# Patient Record
Sex: Male | Born: 1940 | Race: White | Hispanic: No | Marital: Single | Smoking: Former smoker
Health system: Southern US, Community
[De-identification: ages and names within clinical notes are randomized; demographics above are authoritative.]

## PROBLEM LIST (undated history)

## (undated) DIAGNOSIS — I10 Essential (primary) hypertension: Secondary | ICD-10-CM

## (undated) DIAGNOSIS — N419 Inflammatory disease of prostate, unspecified: Secondary | ICD-10-CM

## (undated) DIAGNOSIS — I4891 Unspecified atrial fibrillation: Secondary | ICD-10-CM

## (undated) DIAGNOSIS — I509 Heart failure, unspecified: Secondary | ICD-10-CM

## (undated) DIAGNOSIS — K219 Gastro-esophageal reflux disease without esophagitis: Secondary | ICD-10-CM

## (undated) DIAGNOSIS — I2510 Atherosclerotic heart disease of native coronary artery without angina pectoris: Secondary | ICD-10-CM

## (undated) DIAGNOSIS — N200 Calculus of kidney: Secondary | ICD-10-CM

## (undated) DIAGNOSIS — I420 Dilated cardiomyopathy: Secondary | ICD-10-CM

## (undated) DIAGNOSIS — Z86718 Personal history of other venous thrombosis and embolism: Secondary | ICD-10-CM

## (undated) DIAGNOSIS — I428 Other cardiomyopathies: Secondary | ICD-10-CM

## (undated) DIAGNOSIS — J189 Pneumonia, unspecified organism: Secondary | ICD-10-CM

## (undated) DIAGNOSIS — E119 Type 2 diabetes mellitus without complications: Secondary | ICD-10-CM

## (undated) DIAGNOSIS — M1990 Unspecified osteoarthritis, unspecified site: Secondary | ICD-10-CM

## (undated) DIAGNOSIS — I255 Ischemic cardiomyopathy: Secondary | ICD-10-CM

## (undated) DIAGNOSIS — B029 Zoster without complications: Secondary | ICD-10-CM

## (undated) DIAGNOSIS — J449 Chronic obstructive pulmonary disease, unspecified: Secondary | ICD-10-CM

## (undated) HISTORY — DX: Gastro-esophageal reflux disease without esophagitis: K21.9

## (undated) HISTORY — DX: Calculus of kidney: N20.0

## (undated) HISTORY — DX: Personal history of other venous thrombosis and embolism: Z86.718

## (undated) HISTORY — PX: BACK SURGERY: SHX140

## (undated) HISTORY — DX: Ischemic cardiomyopathy: I25.5

## (undated) HISTORY — PX: CORONARY STENT PLACEMENT: SHX1402

## (undated) HISTORY — PX: PACEMAKER INSERTION: SHX728

## (undated) HISTORY — DX: Inflammatory disease of prostate, unspecified: N41.9

## (undated) HISTORY — DX: Heart failure, unspecified: I50.9

## (undated) HISTORY — PX: CARDIAC DEFIBRILLATOR PLACEMENT: SHX171

## (undated) HISTORY — DX: Other cardiomyopathies: I42.8

## (undated) HISTORY — DX: Dilated cardiomyopathy: I42.0

## (undated) HISTORY — DX: Atherosclerotic heart disease of native coronary artery without angina pectoris: I25.10

## (undated) HISTORY — DX: Essential (primary) hypertension: I10

## (undated) HISTORY — DX: Type 2 diabetes mellitus without complications: E11.9

## (undated) HISTORY — DX: Pneumonia, unspecified organism: J18.9

## (undated) HISTORY — DX: Unspecified osteoarthritis, unspecified site: M19.90

## (undated) HISTORY — DX: Unspecified atrial fibrillation: I48.91

---

## 1994-01-21 DIAGNOSIS — I2510 Atherosclerotic heart disease of native coronary artery without angina pectoris: Secondary | ICD-10-CM

## 1994-01-21 HISTORY — DX: Atherosclerotic heart disease of native coronary artery without angina pectoris: I25.10

## 2000-08-05 ENCOUNTER — Ambulatory Visit (HOSPITAL_COMMUNITY): Admission: RE | Admit: 2000-08-05 | Discharge: 2000-08-05 | Payer: Self-pay | Admitting: Internal Medicine

## 2000-08-05 ENCOUNTER — Encounter: Payer: Self-pay | Admitting: Internal Medicine

## 2002-01-18 ENCOUNTER — Ambulatory Visit (HOSPITAL_COMMUNITY): Admission: RE | Admit: 2002-01-18 | Discharge: 2002-01-18 | Payer: Self-pay | Admitting: Internal Medicine

## 2002-01-18 ENCOUNTER — Encounter: Payer: Self-pay | Admitting: Internal Medicine

## 2004-07-04 ENCOUNTER — Emergency Department (HOSPITAL_COMMUNITY): Admission: EM | Admit: 2004-07-04 | Discharge: 2004-07-04 | Payer: Self-pay | Admitting: Family Medicine

## 2005-08-16 ENCOUNTER — Ambulatory Visit: Payer: Self-pay | Admitting: Cardiology

## 2006-04-23 ENCOUNTER — Encounter (INDEPENDENT_AMBULATORY_CARE_PROVIDER_SITE_OTHER): Payer: Self-pay | Admitting: Internal Medicine

## 2007-02-24 ENCOUNTER — Ambulatory Visit: Payer: Self-pay | Admitting: Gastroenterology

## 2007-02-24 ENCOUNTER — Inpatient Hospital Stay (HOSPITAL_COMMUNITY): Admission: EM | Admit: 2007-02-24 | Discharge: 2007-02-26 | Payer: Self-pay | Admitting: Emergency Medicine

## 2007-02-25 ENCOUNTER — Ambulatory Visit: Payer: Self-pay | Admitting: Gastroenterology

## 2008-03-09 ENCOUNTER — Emergency Department (HOSPITAL_COMMUNITY): Admission: EM | Admit: 2008-03-09 | Discharge: 2008-03-09 | Payer: Self-pay | Admitting: Emergency Medicine

## 2010-02-11 ENCOUNTER — Encounter: Payer: Self-pay | Admitting: Thoracic Surgery (Cardiothoracic Vascular Surgery)

## 2010-04-09 DIAGNOSIS — R072 Precordial pain: Secondary | ICD-10-CM

## 2010-04-09 DIAGNOSIS — R000 Tachycardia, unspecified: Secondary | ICD-10-CM

## 2010-04-09 DIAGNOSIS — I509 Heart failure, unspecified: Secondary | ICD-10-CM

## 2010-04-10 DIAGNOSIS — I501 Left ventricular failure: Secondary | ICD-10-CM

## 2010-04-11 DIAGNOSIS — I2589 Other forms of chronic ischemic heart disease: Secondary | ICD-10-CM

## 2010-06-05 NOTE — Consult Note (Signed)
NAME:  Troy [name], Troy [name] NO.:  [id number]   MEDICAL RECORD NO.:  [id number]          PATIENT TYPE:  INP   LOCATION:  A222                          FACILITY:  [facility]   PHYSICIAN:  Caro Hight, M.D.      DATE OF BIRTH:  1940/02/03   DATE OF CONSULTATION:  DATE OF DISCHARGE:                                 CONSULTATION   DATE OF CONSULTATION:  02/24/2007   REFERRING PHYSICIAN:  Unk Lightning, MD   REASON FOR CONSULTATION:  Rectal bleeding.   HISTORY OF PRESENT ILLNESS:  Troy [name] is a 70 year old male who is in  his usual state of health until this morning when he woke up with  painless rectal bleeding.  He has a significant past medical history of  coronary artery disease requiring Coumadin therapy and a history of  diverticulitis treated at [facility].  He denies having  a colonoscopy within the last 5 to 10 years.  He has not been having any  nausea, vomiting, problems swallowing, heartburn, indigestion, abdominal  pain or weight loss.  He has had no diarrhea or black stool that looks  like tar.  He has not been vomiting up any blood.   PAST MEDICAL HISTORY:  1. Hypertension.  2. Coronary artery disease.  3. Gout.   ALLERGIES:  NO KNOWN DRUG ALLERGIES.   MEDICATIONS:  1. Digoxin.  2. Nisoldipine.  3. Coumadin.  4. Allopurinol.  5. Benicar.  6. Naproxen 500 mg twice a day.   FAMILY HISTORY:  His mother had colon cancer at age greater than 32.  He  denies any family history of problems.   SOCIAL HISTORY:  He denies any alcohol use.  He does not smoke.   REVIEW OF SYSTEMS:  He complains of some mild shortness of breath.  He  is on Coumadin as well for a history of pulmonary embolus and deep vein  thrombosis.  His review of systems is per HPI, otherwise all systems are  negative.   PHYSICAL EXAMINATION:  VITAL SIGNS:  Afebrile, hemodynamically stable.  GENERAL:  He is in no apparent distress, alert and oriented x4.  HEENT  EXAM:  Atraumatic, normocephalic, pupils equal and react to light.  Mouth:  No oral lesions.  Posterior pharynx without erythema or exudate.  NECK:  Has full range of motion, no lymphadenopathy.  LUNGS:  Clear to auscultation bilaterally.  CARDIOVASCULAR EXAM:  Regular rhythm, normal S1, S2.  ABDOMEN:  Bowel sounds are present, soft, nontender, nondistended, no  rebound or guarding.  EXTREMITIES:  Have no cyanosis or edema.  NEUROLOGIC:  He has no focal neurological deficit.   LABORATORY DATA:  White count 15.1, hemoglobin 14 to 12.5, platelets  212, INR 2.5, BUN 23, creatinine 1.54.   ASSESSMENT:  Troy [name] is a 70 year old male with painless rectal  bleeding.  The differential diagnosis includes diverticular or  hemorrhoidal bleed and a low likelihood of colorectal cancer or a polyp  or NSAID-induced ulcer.   Thank you for allowing me to see Troy [name] in consultation.  My  recommendations follow.   RECOMMENDATIONS:  1. Clear liquid diet now and then NPO at midnight.  2. Will schedule colonoscopy tomorrow.  3. Hold aspirin, Plavix, Coumadin, and anti-inflammatory drugs.  4. Check PT and INR on 02/25/2007.  His INR needs to be less than 2.0.  5. Give Vitamin K 2.5 mg IV now.  6. Serial hemoglobin and hematocrit and transfuse if needed.      Caro Hight, M.D.  Electronically Signed     SM/MEDQ  D:  02/24/2007  T:  02/24/2007  Job:  [id number]   cc:   Unk Lightning, MD  Fax: [phone]

## 2010-06-05 NOTE — H&P (Signed)
NAME:  Troy [name], FEINSTEIN NO.:  [id number]   MEDICAL RECORD NO.:  [id number]          PATIENT TYPE:  INP   LOCATION:  A222                          FACILITY:  [facility]   PHYSICIAN:  Unk Lightning, MDDATE OF BIRTH:  [birth year]   DATE OF ADMISSION:  02/24/2007  DATE OF DISCHARGE:  LH                              HISTORY & PHYSICAL   HISTORY:  The patient is a 70 year old white male who is current  anticoagulated due to paroxysmal atrial fibrillation, currently in sinus  rhythm.  The patient had a bowel movement this morning and noticed frank  hematochezia.  He denied any specific abdominal pain, dizziness, angina,  orthopnea or syncopal episode.  The patient presented to the emergency  room where his hemoglobin was 14.1; however, there were maroon stools  and bright red stools.  He is admitted for a presumed lower GI bleed.   PAST MEDICAL HISTORY:  1. Significant for type 2 diabetes.  2. Hypertension.  3. Paroxysmal atrial fibrillation, currently in sinus rhythm.  4. Gout.  5. Degenerative joint disease.  6. Type 2 diabetes.   PAST SURGICAL HISTORY:  Essentially unremarkable.   ALLERGIES:  No known drug allergies.   CURRENT MEDICATIONS:  1. Benicar 40/12.5 mg daily.  2. Coumadin 5/2.5/2.5 mg.  3. Sular 17 mg daily.  4. Allopurinol 300 mg daily.  5. Lanoxin 0.125 mg daily.  6. Naprosyn 500 mg p.o. twice daily.  7. Glipizide XL 10 mg daily.   PHYSICAL EXAMINATION:  VITAL SIGNS:  Blood pressure 127/80, respirations  18, pulse 88 and regular, O2 saturation 97%.  HEENT:  Eyes:  Pupils equal, round, reactive to light and accommodation.  Extraocular movements intact.  Oropharynx pink.  NECK:  No jugular venous distention, no carotid bruits, no thyromegaly.  LUNGS:  Prolonged expiratory phase.  No rales, wheeze or rhonchi  appreciable.  HEART:  A regular rhythm.  No murmurs, gallops, heaves, thrills or rubs.  ABDOMEN:  Soft, bowel sounds mildly  hyperactive.  No borborygmus.  No  peristaltic rushes.  No detectable organomegaly.  No tenderness.  RECTAL:  Frankly heme-positive visually in the emergency room.  EXTREMITIES:  No clubbing, cyanosis or edema.  NEUROLOGIC:  Cranial nerves II-XII  grossly intact.  The patient moves  all four extremities.   IMPRESSION:  1. Lower gastrointestinal bleed.  2. Hypertension.  3. Anticoagulation, secondary to paroxysmal atrial fibrillation.  4. Currently in sinus rhythm.  5. Type 2 diabetes.  6. Degenerative joint disease, on non-steroidal anti-inflammatory      drugs.   PLAN:  To admit.  A clear liquid diet.  Hemoglobin and hematocrit q.6h.  Type and cross match for 2 units.  A GI consultation for presumed  colonoscopy  per Dr. Caro Hight.  Monitor hemodynamic status.  Monitor  PT and INR and vitamin K to reverse any effects of anticoagulation.  I  will make further recommendations as the data base expands.      Unk Lightning, MD  Electronically Signed     RMD/MEDQ  D:  02/24/2007  T:  02/25/2007  Job:  [id number]

## 2010-06-05 NOTE — Op Note (Signed)
NAME:  Troy [name], Troy [name] NO.:  [id number]   MEDICAL RECORD NO.:  [id number]          PATIENT TYPE:  INP   LOCATION:  A222                          FACILITY:  [facility]   PHYSICIAN:  Caro Hight, M.D.      DATE OF BIRTH:  14-Jun-1940   DATE OF PROCEDURE:  02/25/2007  DATE OF DISCHARGE:                               OPERATIVE REPORT   PROCEDURE:  Colonoscopy.   ENDOSCOPIST:  Dr. Caro Hight.   INDICATIONS FOR PROCEDURE:  Troy [name] is a 70 year old male who  presented with rectal bleeding.  He is on Coumadin due to a history of  pulmonary embolus and deep venous thrombosis.  His last episode of  rectal bleeding was yesterday.   FINDINGS:  1. No old blood or fresh blood seen in the colon. Frequent sigmoid      diverticula.  No diverticula seemed to be the culprit for the      rectal bleeding.  Otherwise no polyps, masses, inflammatory changes      or arteriovenous malformations seen.  2. Moderate internal hemorrhoids.  Otherwise normal retroflexed view      of the rectum.   DIAGNOSIS:  Rectal bleeding, likely secondary to diverticulosis.   DIFFERENTIAL DIAGNOSIS:  Hemorrhoidal bleed while anticoagulated.   RECOMMENDATIONS:  1. Anusol HC per rectum q.12h. for 14 days.  2. Colace 100 mg twice daily.  3. May restart Coumadin in seven days and monitor for bleeding.  4. Advance diet and may administer his Avapro today.   MEDICATIONS:  1. Demerol 50 mg IV.  2. Versed 4 mg IV.   DESCRIPTION OF PROCEDURE:  A physical exam was performed.  An informed  consent was obtained from the patient after explaining the benefits,  risks and alternatives to the procedure.  The patient was connected to  the monitor and placed in the left lateral position.  Continuous oxygen  was provided by nasal cannula and IV medicine administered with an  indwelling cannula.  After administration of sedation and rectal exam,  the patient's rectum was intubated and the scope was advanced under  direct visualization to the cecum.  The scope was removed slowly by  carefully examining the color, texture, anatomy and the integrity of the  mucosa on the way out.  The patient was recovered in endoscopy and was  discharged to the floor in  satisfactory condition. Discussed these  findings and plan with Dr. Percell Miller L. Hawkins and Dr. Cindie Laroche.      Caro Hight, M.D.  Electronically Signed     SM/MEDQ  D:  02/25/2007  T:  02/25/2007  Job:  [id number]   cc:   Unk Lightning, MD  Fax: [phone]

## 2010-06-08 NOTE — Discharge Summary (Signed)
NAME:  Troy [name], Troy [name] NO.:  [id number]   MEDICAL RECORD NO.:  [id number]          PATIENT TYPE:  INP   LOCATION:  A222                          FACILITY:  [facility]   PHYSICIAN:  Unk Lightning, MDDATE OF BIRTH:  [birth year]   DATE OF ADMISSION:  02/24/2007  DATE OF DISCHARGE:  02/05/2009LH                               DISCHARGE SUMMARY   The patient is a 70 year old white male with significant medical history  of hypertension, type 2 diabetes, history of paroxysmal atrial  fibrillation, currently in sinus rhythm, DJD as well as gout.  The  patient was admitted with frank hematochezia.  Hemoglobin did not drop,  however, his white serials and CBC reveal no further drop.  He had frank  blood in the rectum and EGD and colonoscopy were performed revealing the  mild diverticular disease and mild internal hemorrhoids.  However, no  significant polyps or colonic lesions.  EGD likewise was essentially  remarkable.  The patient was admitted and placed on Protonix 40 mg per  day in addition to high-fiber diet, and will follow up in the office in  4 days' time for CBC.      Unk Lightning, MD  Electronically Signed     RMD/MEDQ  D:  05/17/2007  T:  05/17/2007  Job:  [id number]

## 2010-08-21 ENCOUNTER — Inpatient Hospital Stay (HOSPITAL_COMMUNITY): Payer: Medicare Other

## 2010-08-21 ENCOUNTER — Inpatient Hospital Stay (HOSPITAL_COMMUNITY)
Admission: EM | Admit: 2010-08-21 | Discharge: 2010-08-27 | DRG: 226 | Disposition: A | Discharge status: Home Health | Payer: Medicare Other | Attending: Interventional Cardiology | Admitting: Interventional Cardiology

## 2010-08-21 ENCOUNTER — Emergency Department (HOSPITAL_COMMUNITY): Payer: Medicare Other

## 2010-08-21 DIAGNOSIS — I442 Atrioventricular block, complete: Secondary | ICD-10-CM

## 2010-08-21 DIAGNOSIS — I2510 Atherosclerotic heart disease of native coronary artery without angina pectoris: Secondary | ICD-10-CM | POA: Diagnosis present

## 2010-08-21 DIAGNOSIS — K5731 Diverticulosis of large intestine without perforation or abscess with bleeding: Secondary | ICD-10-CM | POA: Diagnosis present

## 2010-08-21 DIAGNOSIS — I2782 Chronic pulmonary embolism: Secondary | ICD-10-CM | POA: Diagnosis present

## 2010-08-21 DIAGNOSIS — I252 Old myocardial infarction: Secondary | ICD-10-CM

## 2010-08-21 DIAGNOSIS — Z7901 Long term (current) use of anticoagulants: Secondary | ICD-10-CM

## 2010-08-21 DIAGNOSIS — I509 Heart failure, unspecified: Secondary | ICD-10-CM | POA: Diagnosis present

## 2010-08-21 DIAGNOSIS — I129 Hypertensive chronic kidney disease with stage 1 through stage 4 chronic kidney disease, or unspecified chronic kidney disease: Secondary | ICD-10-CM | POA: Diagnosis present

## 2010-08-21 DIAGNOSIS — Z87891 Personal history of nicotine dependence: Secondary | ICD-10-CM

## 2010-08-21 DIAGNOSIS — N189 Chronic kidney disease, unspecified: Secondary | ICD-10-CM | POA: Diagnosis present

## 2010-08-21 DIAGNOSIS — I2589 Other forms of chronic ischemic heart disease: Secondary | ICD-10-CM | POA: Diagnosis present

## 2010-08-21 DIAGNOSIS — E119 Type 2 diabetes mellitus without complications: Secondary | ICD-10-CM | POA: Diagnosis present

## 2010-08-21 DIAGNOSIS — I5022 Chronic systolic (congestive) heart failure: Secondary | ICD-10-CM | POA: Diagnosis present

## 2010-08-21 DIAGNOSIS — R791 Abnormal coagulation profile: Secondary | ICD-10-CM | POA: Diagnosis present

## 2010-08-21 DIAGNOSIS — I4892 Unspecified atrial flutter: Secondary | ICD-10-CM | POA: Diagnosis present

## 2010-08-21 LAB — COMPREHENSIVE METABOLIC PANEL
BUN: 33 mg/dL — ABNORMAL HIGH (ref 6–23)
CO2: 34 mEq/L — ABNORMAL HIGH (ref 19–32)
Calcium: 9.4 mg/dL (ref 8.4–10.5)
Chloride: 100 mEq/L (ref 96–112)
Creatinine, Ser: 2.05 mg/dL — ABNORMAL HIGH (ref 0.50–1.35)
GFR calc Af Amer: 39 mL/min — ABNORMAL LOW (ref >60)
GFR calc non Af Amer: 32 mL/min — ABNORMAL LOW (ref >60)
Glucose, Bld: 135 mg/dL — ABNORMAL HIGH (ref 70–99)
Total Bilirubin: 0.6 mg/dL (ref 0.3–1.2)

## 2010-08-21 LAB — PROTIME-INR: INR: 4.38 — ABNORMAL HIGH (ref 0.00–1.49)

## 2010-08-21 LAB — CBC
HCT: 42.6 % (ref 39.0–52.0)
Hemoglobin: 14.7 g/dL (ref 13.0–17.0)
MCH: 34.3 pg — ABNORMAL HIGH (ref 26.0–34.0)
MCHC: 34.5 g/dL (ref 30.0–36.0)
MCV: 99.5 fL (ref 78.0–100.0)
Platelets: 136 10*3/uL — ABNORMAL LOW (ref 150–400)
RBC: 4.28 MIL/uL (ref 4.22–5.81)
RDW: 14.8 % (ref 11.5–15.5)
WBC: 9.2 10*3/uL (ref 4.0–10.5)

## 2010-08-21 LAB — BASIC METABOLIC PANEL
BUN: 34 mg/dL — ABNORMAL HIGH (ref 6–23)
Calcium: 9.4 mg/dL (ref 8.4–10.5)
Creatinine, Ser: 1.98 mg/dL — ABNORMAL HIGH (ref 0.50–1.35)
GFR calc Af Amer: 41 mL/min — ABNORMAL LOW (ref >60)
GFR calc non Af Amer: 34 mL/min — ABNORMAL LOW (ref >60)
Glucose, Bld: 145 mg/dL — ABNORMAL HIGH (ref 70–99)
Potassium: 4 mEq/L (ref 3.5–5.1)

## 2010-08-21 LAB — CK TOTAL AND CKMB (NOT AT ARMC)
CK, MB: 2.6 ng/mL (ref 0.3–4.0)
Relative Index: INVALID (ref 0.0–2.5)
Total CK: 34 U/L (ref 7–232)

## 2010-08-21 LAB — DIFFERENTIAL
Basophils Absolute: 0 10*3/uL (ref 0.0–0.1)
Basophils Relative: 0 % (ref 0–1)
Eosinophils Absolute: 0.2 10*3/uL (ref 0.0–0.7)
Eosinophils Relative: 2 % (ref 0–5)
Lymphocytes Relative: 24 % (ref 12–46)
Lymphs Abs: 2.2 10*3/uL (ref 0.7–4.0)
Monocytes Absolute: 0.9 10*3/uL (ref 0.1–1.0)
Monocytes Relative: 10 % (ref 3–12)
Neutro Abs: 5.9 10*3/uL (ref 1.7–7.7)
Neutrophils Relative %: 65 % (ref 43–77)

## 2010-08-21 LAB — CARDIAC PANEL(CRET KIN+CKTOT+MB+TROPI)
CK, MB: 2.7 ng/mL (ref 0.3–4.0)
Relative Index: 2.6 — ABNORMAL HIGH (ref 0.0–2.5)
Total CK: 102 U/L (ref 7–232)

## 2010-08-21 LAB — TYPE AND SCREEN
Antibody Screen: POSITIVE
DAT, IgG: NEGATIVE

## 2010-08-21 LAB — GLUCOSE, CAPILLARY: Glucose-Capillary: 99 mg/dL (ref 70–99)

## 2010-08-21 LAB — PHOSPHORUS: Phosphorus: 2.8 mg/dL (ref 2.3–4.6)

## 2010-08-21 LAB — MRSA PCR SCREENING: MRSA by PCR: NEGATIVE

## 2010-08-22 ENCOUNTER — Encounter: Payer: Self-pay | Admitting: *Deleted

## 2010-08-22 DIAGNOSIS — I509 Heart failure, unspecified: Secondary | ICD-10-CM

## 2010-08-22 DIAGNOSIS — I2589 Other forms of chronic ischemic heart disease: Secondary | ICD-10-CM

## 2010-08-22 LAB — BASIC METABOLIC PANEL
BUN: 34 mg/dL — ABNORMAL HIGH (ref 6–23)
Calcium: 9.4 mg/dL (ref 8.4–10.5)
GFR calc Af Amer: 41 mL/min — ABNORMAL LOW (ref >60)
GFR calc non Af Amer: 34 mL/min — ABNORMAL LOW (ref >60)
Glucose, Bld: 136 mg/dL — ABNORMAL HIGH (ref 70–99)
Potassium: 4.3 mEq/L (ref 3.5–5.1)

## 2010-08-22 LAB — CARDIAC PANEL(CRET KIN+CKTOT+MB+TROPI)
CK, MB: 3 ng/mL (ref 0.3–4.0)
Total CK: 45 U/L (ref 7–232)
Troponin I: <0.3 ng/mL (ref <0.30)

## 2010-08-22 LAB — OCCULT BLOOD X 1 CARD TO LAB, STOOL: Fecal Occult Bld: POSITIVE

## 2010-08-22 LAB — GLUCOSE, CAPILLARY

## 2010-08-22 LAB — PROTIME-INR: Prothrombin Time: 26.3 seconds — ABNORMAL HIGH (ref 11.6–15.2)

## 2010-08-23 ENCOUNTER — Inpatient Hospital Stay (HOSPITAL_COMMUNITY): Payer: Medicare Other

## 2010-08-23 DIAGNOSIS — I4892 Unspecified atrial flutter: Secondary | ICD-10-CM

## 2010-08-23 LAB — GLUCOSE, CAPILLARY: Glucose-Capillary: 90 mg/dL (ref 70–99)

## 2010-08-23 LAB — PREPARE FRESH FROZEN PLASMA: Unit division: 0

## 2010-08-23 LAB — BASIC METABOLIC PANEL
BUN: 35 mg/dL — ABNORMAL HIGH (ref 6–23)
Calcium: 8.6 mg/dL (ref 8.4–10.5)
GFR calc Af Amer: 49 mL/min — ABNORMAL LOW (ref >60)
GFR calc non Af Amer: 41 mL/min — ABNORMAL LOW (ref >60)
Potassium: 3.5 mEq/L (ref 3.5–5.1)
Sodium: 143 mEq/L (ref 135–145)

## 2010-08-23 LAB — CBC
MCH: 33.6 pg (ref 26.0–34.0)
MCHC: 33.8 g/dL (ref 30.0–36.0)
RDW: 14.3 % (ref 11.5–15.5)

## 2010-08-23 NOTE — H&P (Signed)
NAME:  Troy [name], Troy [name] NO.:  [id number]  MEDICAL RECORD NO.:  [id number]  LOCATION:  2907                         FACILITY:  [facility]  PHYSICIAN:  Fransico Him, M.D.     DATE OF BIRTH:  Feb 20, 1940  DATE OF ADMISSION:  08/21/2010 DATE OF DISCHARGE:                             HISTORY & PHYSICAL   PRIMARY CARDIOLOGIST:  Jettie Booze, MD  REFERRING PHYSICIAN:  Dr. Neomia Dear from the emergency room.  CHIEF COMPLAINT:  Shortness of breath.  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  He denies any chest pain, palpitations, or lower extremity edema or syncope.  He says he occasionally has lightheadedness.  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  ALLERGIES:  BENADRYL.  PAST SURGICAL HISTORY:  __________ surgery.  MEDICATIONS: 1. Digoxin 0.125 mg daily. 2. Torsemide 20 mg 2 tablets daily. 3. Carvedilol 6.25 mg b.i.d. 4. Warfarin. 5. Allopurinol 100 mg daily.  SOCIAL HISTORY:  He quit tobacco in 1982.  He had a 40-pack-year history prior to that.  He denies any alcohol use.  He is single.  FAMILY HISTORY:  Noncontributory.  REVIEW OF SYSTEMS:  Otherwise stated in the HPI is negative.  PHYSICAL EXAMINATION:  VITAL SIGNS:  Blood pressure is 147/55, heart rate 38. GENERAL:  This is a well-developed, well-nourished male in no acute distress. HEENT:  Benign. NECK:  Supple without lymphadenopathy.  Carotid upstrokes are +2 bilaterally.  No bruits. LUNGS:   Clear to auscultation throughout. HEART:  Regular rate and rhythm.  No murmurs, rubs, or gallops but bradycardic. ABDOMEN:  Soft, nontender, nondistended.  Normoactive bowel sounds.  No hepatosplenomegaly. EXTREMITIES:  No cyanosis, erythema, or edema.  LABORATORY DATA:  INR 4.38.  White cell count 9.2, hemoglobin 14.7, hematocrit 42.6, platelet count 136,000.  Chest x-ray shows cardiomegaly with central vascular congestion.  EKG shows sinus rhythm with complete heart block.  ASSESSMENT: 1. Complete heart block. 2. History of paroxysmal atrial fibrillation on chronic systemic     anticoagulation. 3. 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 4. Supratherapeutic INR. 5. Coronary artery disease status post remote percutaneous coronary     intervention of the right coronary artery. 6. Hypertension. 7. Diabetes mellitus. 8. Shortness of breath secondary to complete heart block.  PLAN:  Admit to CCU.  Cycle cardiac enzymes.  We will check a TSH and BMP.  We will check a 2-D echocardiogram to reevaluate LV function.  He needs to stay on Coreg long-term secondary to dilated cardiomyopathy. Therefore, we will plan permanent pacemaker, Dr. Rayann Heman has been consulted.  In the interim, we will hold his Coreg and digoxin.  We will also hold his Coumadin.  Fransico Him, M.D.     TT/MEDQ  D:  08/22/2010  T:  08/22/2010  Job:  [id number]  cc:   Jettie Booze, MD  Electronically Signed by Fransico Him M.D. on 08/23/2010 08:57:24 AM

## 2010-08-24 LAB — GLUCOSE, CAPILLARY
Glucose-Capillary: 105 mg/dL — ABNORMAL HIGH (ref 70–99)
Glucose-Capillary: 203 mg/dL — ABNORMAL HIGH (ref 70–99)
Glucose-Capillary: 142 mg/dL — ABNORMAL HIGH (ref 70–99)
Glucose-Capillary: 162 mg/dL — ABNORMAL HIGH (ref 70–99)

## 2010-08-24 LAB — BASIC METABOLIC PANEL
CO2: 39 mEq/L — ABNORMAL HIGH (ref 19–32)
Chloride: 96 mEq/L (ref 96–112)
Creatinine, Ser: 1.5 mg/dL — ABNORMAL HIGH (ref 0.50–1.35)
GFR calc Af Amer: 56 mL/min — ABNORMAL LOW (ref >60)
Potassium: 3.5 mEq/L (ref 3.5–5.1)
Sodium: 140 mEq/L (ref 135–145)

## 2010-08-24 LAB — PROTIME-INR
INR: 2.52 — ABNORMAL HIGH (ref 0.00–1.49)
Prothrombin Time: 27.6 seconds — ABNORMAL HIGH (ref 11.6–15.2)

## 2010-08-24 LAB — CBC
HCT: 40.1 % (ref 39.0–52.0)
Hemoglobin: 13.5 g/dL (ref 13.0–17.0)
MCHC: 33.7 g/dL (ref 30.0–36.0)
RBC: 4.05 MIL/uL — ABNORMAL LOW (ref 4.22–5.81)

## 2010-08-25 LAB — CBC
HCT: 40.8 % (ref 39.0–52.0)
MCH: 33.7 pg (ref 26.0–34.0)
MCHC: 33.6 g/dL (ref 30.0–36.0)
MCV: 100.2 fL — ABNORMAL HIGH (ref 78.0–100.0)
Platelets: 108 10*3/uL — ABNORMAL LOW (ref 150–400)
RDW: 14.3 % (ref 11.5–15.5)

## 2010-08-25 LAB — BASIC METABOLIC PANEL
BUN: 37 mg/dL — ABNORMAL HIGH (ref 6–23)
CO2: 41 mEq/L (ref 19–32)
Calcium: 9.1 mg/dL (ref 8.4–10.5)
GFR calc non Af Amer: 51 mL/min — ABNORMAL LOW (ref >60)
Glucose, Bld: 121 mg/dL — ABNORMAL HIGH (ref 70–99)
Sodium: 142 mEq/L (ref 135–145)

## 2010-08-25 LAB — GLUCOSE, CAPILLARY: Glucose-Capillary: 162 mg/dL — ABNORMAL HIGH (ref 70–99)

## 2010-08-26 LAB — BASIC METABOLIC PANEL
CO2: 40 mEq/L (ref 19–32)
Calcium: 9.1 mg/dL (ref 8.4–10.5)
Chloride: 97 mEq/L (ref 96–112)
Creatinine, Ser: 1.37 mg/dL — ABNORMAL HIGH (ref 0.50–1.35)
Glucose, Bld: 120 mg/dL — ABNORMAL HIGH (ref 70–99)
Sodium: 142 mEq/L (ref 135–145)

## 2010-08-26 LAB — CBC
Hemoglobin: 12.6 g/dL — ABNORMAL LOW (ref 13.0–17.0)
MCH: 33.4 pg (ref 26.0–34.0)
MCV: 100.8 fL — ABNORMAL HIGH (ref 78.0–100.0)
Platelets: 116 10*3/uL — ABNORMAL LOW (ref 150–400)
RBC: 3.77 MIL/uL — ABNORMAL LOW (ref 4.22–5.81)
WBC: 9.5 10*3/uL (ref 4.0–10.5)

## 2010-08-27 LAB — PROTIME-INR
INR: 2.53 — ABNORMAL HIGH (ref 0.00–1.49)
Prothrombin Time: 27.7 seconds — ABNORMAL HIGH (ref 11.6–15.2)

## 2010-08-27 LAB — BASIC METABOLIC PANEL
BUN: 31 mg/dL — ABNORMAL HIGH (ref 6–23)
Chloride: 96 mEq/L (ref 96–112)
Creatinine, Ser: 1.13 mg/dL (ref 0.50–1.35)
GFR calc Af Amer: >60 mL/min (ref >60)
Glucose, Bld: 118 mg/dL — ABNORMAL HIGH (ref 70–99)

## 2010-08-28 DIAGNOSIS — K5731 Diverticulosis of large intestine without perforation or abscess with bleeding: Secondary | ICD-10-CM | POA: Insufficient documentation

## 2010-08-31 NOTE — Op Note (Signed)
NAME:  Troy [name], Troy [name] NO.:  [id number]  MEDICAL RECORD NO.:  [id number]  LOCATION:  2907                         FACILITY:  [facility]  PHYSICIAN:  Champ Mungo. Lovena Le, MD    DATE OF BIRTH:  01-Mar-1940  DATE OF PROCEDURE:  08/22/2010 DATE OF DISCHARGE:                              OPERATIVE REPORT   PROCEDURE PERFORMED:  Insertion of a biventricular implantable cardioverter-defibrillator.  INDICATION:  Ischemic cardiomyopathy, EF 20%, presenting with complete heart block and class 3 heart failure.  INTRODUCTION:  The patient is a 70 year old man who has longstanding coronary artery disease status post myocardial infarction.  His ejection fraction is 20%.  The patient presents with congestive heart failure and is found to be in complete heart block.  He has very dense and frequent ventricular ectopy and nonsustained VT.  He is now referred for BiV ICD implantation.  PROCEDURE:  After informed consent was obtained, the patient was taken to the Diagnostic EP Lab in the fasting state.  After usual preparation and draping, intravenous fentanyl and midazolam was given for sedation. 30 mL of lidocaine was infiltrated into the left infraclavicular region. A 6-cm incision was carried out over this region and an electrocautery was utilized to dissect down to the fascial plane.  The left subclavian vein was punctured x3 and the St. Jude model [id number] bipolar active fixation defibrillation lead, serial number [id number], was advanced into the right ventricle and the St. Jude model 2088T 52-cm active fixation pacing lead, serial number [id number] was advanced into the right atrium. Mapping was carried out in the right ventricle and at the final site on the RV septum the R-waves measured 8 mV and the pacing impedance was 700 ohms.  The threshold was a volt at 0.5 milliseconds.  A 10-volt pacing did not stimulate the diaphragm.  With the right ventricular lead in satisfactory  position, attention was then turned to the placement of the atrial lead which was placed in the anterolateral wall of the right atrium where P-wave was measured 2.5 mV and the pacing impedance with the lead actively fixed was 530 ohms.  The threshold was 0.8 volts at 0.5 milliseconds.  Again, a 10-volt pacing did not stimulate the diaphragm.  With both atrial and ventricular leads in satisfactory position, attention was then turned to the placement of the left ventricular lead.  The coronary sinus guiding catheter was advanced into the right atrium and the coronary sinus was cannulated with the aid of a 6-French hexapolar EP catheter.  Venography of the coronary sinus was carried out demonstrating a large posterolateral vein, which was selected for LV lead placement.  The St. Jude 1458Q-86 cm quadripolar LV pacing lead, serial number [id number] was advanced into the posterolateral vein.  Multiple satisfactory pacing configurations were demonstrated.  The guiding catheter was removed from the vein in the usual manner.  The leads were secured to the subpectoralis fascia with a figure-of-eight silk suture and the sewing sleeve was secured with silk suture.  Electrocautery was then utilized to make subcutaneous pocket. Antibiotic irrigation was utilized to irrigate the pocket and the St. Jude defibrillator was connected to the right atrial, RV, and LV leads. Of note,  the defibrillator lead was actually a Durata lead, serial number [id number].  The defibrillator was serial number [id number].  After additional antibiotic irrigation was utilized to irrigate the pocket, defibrillation threshold testing was carried out.  After the patient was more deeply sedated with fentanyl and Versed, VF was induced with a T-wave shock.  A 12-joule shock was delivered, which terminated ventricular fibrillation and restored sinus rhythm.  No additional defibrillation threshold testing was carried out and the incision  was closed with 2-0 and 3-0 Vicryl.  Benzoin and Steri-Strips were painted on the skin, pressure dressing was applied, and the patient was returned to his room in satisfactory condition.  COMPLICATIONS:  There were no immediate procedure complications.  RESULTS:  This demonstrates successful implantation of a biventricular ICD in a patient with an ischemic cardiomyopathy, class 3 congestive heart failure, and complete heart block.     Champ Mungo. Lovena Le, MD     GWT/MEDQ  D:  08/22/2010  T:  08/22/2010  Job:  [id number]  Electronically Signed by Cristopher Peru MD on 08/31/2010 09:54:12 AM

## 2010-09-04 NOTE — Consult Note (Signed)
  NAME:  Troy [name], Troy [name] NO.:  [id number]  MEDICAL RECORD NO.:  [id number]  LOCATION:  2923                         FACILITY:  [facility]  PHYSICIAN:  Ronald Lobo, M.D.   DATE OF BIRTH:  Aug 09, 1940  DATE OF CONSULTATION:  08/23/2010 DATE OF DISCHARGE:                                CONSULTATION   [name] asked [place] to see this 70 year old gentleman because of rectal bleeding.  This gentleman has a history of severe cardiomyopathy with an ejection fraction on the order of 25-30%, as well as a history of DVTs and pulmonary emboli for which he is maintained on chronic anticoagulation with Coumadin.  He came into the hospital a couple of days ago in complete heart block and underwent placement of a pacemaker and implantable defibrillator yesterday.  Shortly after that procedure, he had passage of bright red blood per rectum.  He has not had any further bowel movements today.  It is not entirely clear to me how many times he passed blood yesterday. His hemoglobin did not drop significantly, it was 14.7 two days ago and is 13.2 today.  Platelets are slightly low at 105,000.  The patient indicates that about 20 years ago he was hospitalized at [facility] in [place], [place] for what sounds like diverticular hemorrhage.  PAST MEDICAL HISTORY:  Allergy to BENADRYL.  Outpatient medications:  Digoxin, torsemide, carvedilol, Coumadin, and allopurinol.  Operations:  He has had prostate surgery in the past.  Medical illnesses:  Coronary artery disease, history of severe ischemic dilated cardiomyopathy, hypertension, GERD, recurrent DVTs and pulmonary emboli, type 2 diabetes, gout, and kidney stones.  HABITS:  Nonsmoker for the past 20 years, nondrinker.  FAMILY HISTORY:  Not obtained.  PHYSICAL EXAMINATION:  GENERAL:  A very pleasant, somewhat elderly gentleman in no acute distress. HEENT:  Anicteric, no frank pallor. SKIN:  Warm. CHEST:   Clear. HEART:  Without murmur or arrhythmia. ABDOMEN:  Without mass or tenderness.  LABORATORY DATA:  Per HPI.  Platelets are somewhat low at 105,000, BUN 35, creatinine 1.7.  IMPRESSION:  Transient small volume hematochezia without obvious destabilization or significant drop in hemoglobin.  The differential diagnosis would include hemorrhoids, stercoral ulceration, ischemic colitis, vascular ectasia, self-limited diverticular bleeding, or, less likely, colorectal neoplasia.  PLAN:  Sigmoidoscopic evaluation to try to clarify the source of bleeding.          ______________________________ Ronald Lobo, M.D.     RB/MEDQ  D:  08/23/2010  T:  08/24/2010  Job:  [id number]  Electronically Signed by Ronald Lobo M.D. on 09/04/2010 10:58:07 AM

## 2010-09-05 ENCOUNTER — Ambulatory Visit: Payer: Medicare Other | Admitting: *Deleted

## 2010-09-06 ENCOUNTER — Ambulatory Visit (INDEPENDENT_AMBULATORY_CARE_PROVIDER_SITE_OTHER): Payer: Medicare Other | Admitting: *Deleted

## 2010-09-06 DIAGNOSIS — I2589 Other forms of chronic ischemic heart disease: Secondary | ICD-10-CM

## 2010-09-06 DIAGNOSIS — I4510 Unspecified right bundle-branch block: Secondary | ICD-10-CM

## 2010-09-06 NOTE — Progress Notes (Signed)
Wound check-ICD 

## 2010-09-13 NOTE — Consult Note (Signed)
NAME:  Troy [name], KRAHMER NO.:  [id number]  MEDICAL RECORD NO.:  [id number]  LOCATION:  [id number]                         FACILITY:  [facility]  PHYSICIAN:  Thompson Grayer, MD       DATE OF BIRTH:  25-Nov-1940  DATE OF CONSULTATION: DATE OF DISCHARGE:                                CONSULTATION   REFERRING PHYSICIAN:  Fransico Him, MD  REASON FOR CONSULTATION:  Complete heart block.  HISTORY OF PRESENT ILLNESS:  Mr. Daisy is a pleasant 70 year old gentleman with a known history of coronary disease, ischemic cardiomyopathy, and New York Heart Association Class II/III, congestive heart failure symptoms chronically who now presents with symptomatic complete heart block.  The patient reports that over the past few months, he has had progressive symptoms of shortness of breath.  He presented to [facility] and was evaluated there by Dr. Dannielle Burn. He had an echocardiogram which revealed an ejection fraction of 35-40%. He was therefore treated with medical therapies.  He also had a Myoview at that time which was apparently at low risk and medical management was recommended.  He has been following with Dr. Larae Grooms since that time.  He reports some improvements in shortness of breath, but typically has decreased exercise tolerance as well as orthopnea and occasional PND.  He reports stable edema.  He has a history of PTE and therefore is chronically anticoagulated with Coumadin.  He previously underwent stenting at [facility] in 1996.  Today, he reports acute worsening in shortness of breath.  He also reports fatigue.  He denies dizziness, presyncope, or syncope.  He presented to [facility] Emergency Department where he was found to have complete heart block.  He is therefore admitted for further management.  PAST MEDICAL HISTORY: 1. CAD status post PCI of the RCA in 1996. 2. Ischemic cardiomyopathy, ejection fraction previously (35-40%). 3. New York Heart Association  Class II/III congestive heart failure     chronically. 4. Diabetes. 5. Hypertension. 6. Prior PTE and DVT with recurrence, chronically anticoagulated with     Coumadin. 7. GERD. 8. DJD. 9. Prostatitis. 10.Recurrent nephrolithiasis.  SOCIAL HISTORY:  The patient lives in [place].  He has a history of tobacco, but quit in 1982.  He denies alcohol use.  FAMILY HISTORY:  Notable for coronary disease.  REVIEW OF SYSTEMS:  All systems reviewed and negative except as outlined in the HPI above.  ALLERGIES:  BENADRYL causes agitation and hallucinations.  Medications are reviewed in the [facility].  PHYSICAL EXAMINATION:  Telemetry reveals complete heart block with a right bundle-branch left anterior fascicular escape pattern at 45 beats per minute. VITAL SIGNS:  Blood pressure 150/49, heart rate 38, respirations 22, sats 97% on room air. GENERAL:  The patient is an elderly male in no acute distress.  He is alert and oriented x3. HEENT:  Normocephalic, atraumatic.  Sclerae are clear.  Conjunctivae pink.  Oropharynx clear.  Neck is supple.  JVP 9 cm.  LUNGS:  Clear. HEART:  Bradycardic, regular rhythm.  No murmurs, rubs, or gallops. GI:  Soft, nontender, nondistended.  Positive bowel sounds. EXTREMITIES:  No clubbing or cyanosis.  He does have 1+ lower extremity edema. SKIN:  No  ecchymoses or lacerations. MUSCULOSKELETAL:  No deformity or atrophy, cyclothymic mood.  Full affect.  EKG reveals complete heart block with a right bundle-branch left anterior fascicular block escape pattern and a QRS duration of 178 milliseconds.  Chest x-ray reveals no acute airspace disease.  LABS:  Potassium 4.1, creatinine 2.0.  Digoxin 1.1, CK 34, CK-MB 2.6, troponin less than 0.3, INR 4.3.  Echocardiogram pending.  IMPRESSION:  Mr. Ham is a pleasant 70 year old gentleman with a known history of coronary disease and chronic systolic dysfunction with New York Heart Association class II/III  congestive heart failure chronically who now presents with symptomatic complete heart block.  He is presently hemodynamically stable with heart block.  On review, it appears that previously he had a right bundle-branch left anterior block pattern at baseline.  I think that he will require ventricular pacing long-term.  We will check a thyroid, but I do not see any other reversible causes at this time as the patient will require beta-blockers long-term for coronary disease and his ischemic cardiomyopathy.  I think that we should assess his ejection fraction at this time and if his ejection fraction is less than 35% then I would recommend biventricular ICD implantation.  If his ejection fraction is greater than 35%, then I would recommend CRTP therapies.  I had a long discussion with the patient regarding risks, benefits, and alternatives to biventricular pacemaker and also biventricular ICD implantation.  He understands the risks and wishes to proceed with whichever device is indicated based on his ejection fraction.  In the interim, we will give the patient FFP and vitamin K in an attempt to reduce his INR as well as reduce his risk for bleeding with the procedure.  We will follow the patient with you closely during his hospital stay.     Thompson Grayer, MD     JA/MEDQ  D:  08/21/2010  T:  08/22/2010  Job:  [id number]  Electronically Signed by Thompson Grayer MD on 09/13/2010 09:44:00 AM

## 2010-09-17 NOTE — Discharge Summary (Signed)
NAME:  Troy [name], Troy [name] NO.:  [id number]  MEDICAL RECORD NO.:  [id number]  LOCATION:  [id number]                         FACILITY:  [facility]  PHYSICIAN:  Jettie Booze, MDDATE OF BIRTH:  [birth year]  DATE OF ADMISSION:  08/21/2010 DATE OF DISCHARGE:  08/27/2010                              DISCHARGE SUMMARY   FINAL DIAGNOSES: 1. Complete heart block. 2. Diverticular bleed. 3. Chronic systolic heart failure. 4. Acute renal failure. 5. Atrial flutter. 6. 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 7. Hypertension.  PROCEDURES PERFORMED: 1. Insertion of biventricular ICD by Dr. Cristopher Peru on August 22, 2010. 2. Flexible sigmoidoscopy by Dr. Cristina Gong on August 23, 2010. 3. Physical therapy consult recommending home health physical therapy.  HOSPITAL COURSE:  The patient was admitted with weakness, was found to be in complete heart block.  He was watched on telemetry overnight and subsequently underwent biventricular ICD placement by Dr. Lovena Le as noted above.  He tolerated the procedure well.  The day after the procedure, he had red blood in his stool.  We obtained a GI consult.  A flex sig was done, which showed what appeared to be a diverticular bleed.  He has a history of diverticulosis.  His hemoglobin dropped only slightly.  He remained hemodynamically stable.  An ARB was added for additional blood pressure control.  His Coreg was increased.  He did develop paroxysmal atrial flutter after his pacemaker insertion.  He was started on amiodarone.  Hopefully, this will be a short-term medication for him.  His Coreg was also increased at that time.  He was watched for any further signs of GI bleeding.  He did have physical therapy evaluation and home health physical therapy was recommended.  There was some question whether he would go to an assisted living, he was not interested in that at all.  His CO2 increased and his diuretics  were therefore decreased.  His Coumadin dose was also decreased because the amiodarone was started.  He will need close followup of his INR.  His creatinine also decreased from about 2 down to 1.1, which was his baseline.  DISCHARGE MEDICATIONS: 1. Amiodarone 400 mg p.o. daily. 2. Carvedilol 12.5 mg p.o. b.i.d. 3. Losartan 50 mg p.o. daily. 4. Torsemide 20 mg daily. 5. Warfarin 2.5 mg p.o. every other day. 6. Allopurinol 100 mg daily. 7. Claritin 10 mg daily. 8. Digoxin 0.125 mg daily.  FOLLOWUP APPOINTMENTS:  With [name] on August 30, 2010 at 2:15.  He will get a Coumadin check at that time as well.  Followup appointment in the [facility] on September 05, 2010 at 12:15 in the afternoon. He will follow up with Dr. Lovena Le on November 27, 2010 at 9:30 a.m.  ACTIVITY:  Increase activity slowly.  He should follow the post pacemaker instructions.  DIET:  Low-sodium, heart-healthy diet.  WOUND CARE:  Keep incision clean and dry for a week.  35 minutes spent on this d/c summary gong over meds and plan with patient.   Jettie Booze, MD     JSV/MEDQ  D:  08/27/2010  T:  08/27/2010  Job:  [id number]  Electronically Signed  by Larae Grooms MD on 09/17/2010 02:01:56 PM

## 2010-10-12 LAB — CBC
HCT: 41.1
Hemoglobin: 14
MCHC: 34.1
MCV: 97
Platelets: 212
RDW: 14.8

## 2010-10-12 LAB — BASIC METABOLIC PANEL
BUN: 23
CO2: 28
Chloride: 100
GFR calc non Af Amer: 45 — ABNORMAL LOW
Glucose, Bld: 143 — ABNORMAL HIGH
Potassium: 3.7

## 2010-10-12 LAB — CROSSMATCH
Antibody Screen: POSITIVE
DAT, IgG: NEGATIVE
Donor AG Type: NEGATIVE

## 2010-10-12 LAB — HEMOGLOBIN AND HEMATOCRIT, BLOOD
HCT: 29.6 — ABNORMAL LOW
HCT: 30.8 — ABNORMAL LOW
HCT: 31.5 — ABNORMAL LOW
HCT: 32.2 — ABNORMAL LOW
HCT: 36.2 — ABNORMAL LOW
Hemoglobin: 10.7 — ABNORMAL LOW
Hemoglobin: 11 — ABNORMAL LOW
Hemoglobin: 11.1 — ABNORMAL LOW
Hemoglobin: 12.1 — ABNORMAL LOW
Hemoglobin: 12.1 — ABNORMAL LOW

## 2010-10-12 LAB — DIFFERENTIAL
Basophils Absolute: 0.1
Basophils Relative: 1
Eosinophils Absolute: 0.2
Eosinophils Relative: 2
Monocytes Absolute: 0.9

## 2010-10-12 LAB — PROTIME-INR
INR: 1.6 — ABNORMAL HIGH
Prothrombin Time: 28.4 — ABNORMAL HIGH

## 2010-11-27 ENCOUNTER — Encounter: Payer: Medicare Other | Admitting: Internal Medicine

## 2011-01-30 ENCOUNTER — Encounter: Payer: Self-pay | Admitting: Internal Medicine

## 2011-01-30 ENCOUNTER — Ambulatory Visit (INDEPENDENT_AMBULATORY_CARE_PROVIDER_SITE_OTHER): Payer: Self-pay | Admitting: Internal Medicine

## 2011-01-30 DIAGNOSIS — I428 Other cardiomyopathies: Secondary | ICD-10-CM

## 2011-01-30 DIAGNOSIS — I4891 Unspecified atrial fibrillation: Secondary | ICD-10-CM

## 2011-01-30 DIAGNOSIS — Z95810 Presence of automatic (implantable) cardiac defibrillator: Secondary | ICD-10-CM

## 2011-01-30 DIAGNOSIS — I509 Heart failure, unspecified: Secondary | ICD-10-CM

## 2011-01-30 DIAGNOSIS — I5022 Chronic systolic (congestive) heart failure: Secondary | ICD-10-CM | POA: Insufficient documentation

## 2011-01-30 DIAGNOSIS — I482 Chronic atrial fibrillation, unspecified: Secondary | ICD-10-CM | POA: Insufficient documentation

## 2011-01-30 DIAGNOSIS — I2510 Atherosclerotic heart disease of native coronary artery without angina pectoris: Secondary | ICD-10-CM

## 2011-01-30 LAB — ICD DEVICE OBSERVATION
BAMS-0003: 70 {beats}/min
HV IMPEDENCE: 44 Ohm
LV LEAD IMPEDENCE ICD: 890 Ohm
PACEART VT: 0
RV LEAD IMPEDENCE ICD: 530 Ohm
TOT-0006: 20120801000000
TOT-0007: 3
TOT-0008: 0
TOT-0009: 1
TOT-0010: 3

## 2011-01-30 NOTE — Patient Instructions (Signed)
Your physician recommends that you schedule a follow-up appointment in: 3 months in the device clinic and in Aug 2013 with Dr Lovena Le

## 2011-01-30 NOTE — Assessment & Plan Note (Signed)
The patient's the device is working normally. We'll plan to recheck in several months.

## 2011-01-30 NOTE — Assessment & Plan Note (Signed)
Symptoms are class II. He will continue his current medications and maintain a low-sodium diet.

## 2011-01-30 NOTE — Assessment & Plan Note (Signed)
He is maintaining sinus rhythm 98% of the time. He will continue his current medical therapy including warfarin

## 2011-01-30 NOTE — Progress Notes (Signed)
HPI [name] returns today for followup. He is a pleasant 71 yo man with a h/o complete heart block, hypertension, chronic systolic heart failure, and an ischemic cardiomyopathy. He is status post biventricular ICD implantation. He continues to do well. His dyspnea is improved. He denies peripheral edema, chest pain, or shortness of breath. No syncope. Allergies  Allergen Reactions  . Benadryl (Altaryl)      Current Outpatient Prescriptions  Medication Sig Dispense Refill  . allopurinol (ZYLOPRIM) 100 MG tablet Take 100 mg by mouth daily.      Marland Kitchen aminophylline 200 MG tablet Take 200 mg by mouth daily.      . carvedilol (COREG) 12.5 MG tablet Take 12.5 mg by mouth 2 (two) times daily with a meal.      . digoxin (LANOXIN) 0.125 MG tablet Take 125 mcg by mouth daily.      Marland Kitchen loratadine (CLARITIN) 10 MG tablet Take 10 mg by mouth daily as needed.      Marland Kitchen losartan (COZAAR) 50 MG tablet Take 50 mg by mouth daily.      Marland Kitchen torsemide (DEMADEX) 20 MG tablet Take 20 mg by mouth 2 (two) times daily.      Marland Kitchen warfarin (COUMADIN) 1 MG tablet Take 1 mg by mouth daily.         Past Medical History  Diagnosis Date  . CAD (coronary artery disease) 1996    status post PCI of the RCA   . Ischemic dilated cardiomyopathy   . HTN (hypertension)   . GERD (gastroesophageal reflux disease)   . Personal history of DVT (deep vein thrombosis)   . Diabetes mellitus, type 2   . DJD (degenerative joint disease)   . Gout   . Prostatitis   . Nephrolithiasis   . Atrial fibrillation   . Congestive heart failure, unspecified   . Other primary cardiomyopathies     ROS:   All systems reviewed and negative except as noted in the HPI.   No past surgical history on file.   No family history on file.   History   Social History  . Marital Status: Single    Spouse Name: N/A    Number of Children: N/A  . Years of Education: N/A   Occupational History  . Not on file.   Social History Main Topics  .  Smoking status: Former Smoker    Quit date: 01/22/1980  . Smokeless tobacco: Not on file  . Alcohol Use: No     denies  . Drug Use: Not on file  . Sexually Active: Not on file   Other Topics Concern  . Not on file   Social History Narrative  . No narrative on file     BP 128/56  Pulse 60  Ht 5\' 5"  (1.651 m)  Wt 80.74 kg (178 lb)  BMI 29.62 kg/m2  Physical Exam:  Well appearing NAD HEENT: Unremarkable Neck:  No JVD, no thyromegally Lungs:  Clear with no wheezes, rales, or rhonchi. HEART:  Regular rate rhythm, no murmurs, no rubs, no clicks Abd:  soft, positive bowel sounds, no organomegally, no rebound, no guarding Ext:  2 plus pulses, no edema, no cyanosis, no clubbing Skin:  No rashes no nodules Neuro:  CN II through XII intact, motor grossly intact  DEVICE  Normal device function.  See PaceArt for details.   Assess/Plan:

## 2011-04-25 ENCOUNTER — Encounter: Payer: Self-pay | Admitting: Internal Medicine

## 2011-05-10 ENCOUNTER — Ambulatory Visit (INDEPENDENT_AMBULATORY_CARE_PROVIDER_SITE_OTHER): Payer: Medicare Other | Admitting: *Deleted

## 2011-05-10 ENCOUNTER — Encounter: Payer: Self-pay | Admitting: Internal Medicine

## 2011-05-10 DIAGNOSIS — I509 Heart failure, unspecified: Secondary | ICD-10-CM

## 2011-05-10 DIAGNOSIS — I428 Other cardiomyopathies: Secondary | ICD-10-CM

## 2011-05-10 LAB — ICD DEVICE OBSERVATION
AL AMPLITUDE: 5 mv
DEV-0020ICD: NEGATIVE
DEVICE MODEL ICD: 7001284
FVT: 0
HV IMPEDENCE: 49 Ohm
MODE SWITCH EPISODES: 5
RV LEAD AMPLITUDE: 12 mv
RV LEAD THRESHOLD: 0.5 V
RV LEAD THRESHOLD: 1.25 V
TOT-0010: 4
VENTRICULAR PACING ICD: 99.18 pct
VF: 0

## 2011-05-10 NOTE — Progress Notes (Signed)
ICD check

## 2011-06-06 ENCOUNTER — Other Ambulatory Visit (HOSPITAL_COMMUNITY): Payer: Self-pay | Admitting: Internal Medicine

## 2011-06-06 DIAGNOSIS — R0602 Shortness of breath: Secondary | ICD-10-CM

## 2011-06-10 ENCOUNTER — Ambulatory Visit (HOSPITAL_COMMUNITY)
Admission: RE | Admit: 2011-06-10 | Discharge: 2011-06-10 | Disposition: A | Discharge status: Home / Self Care | Payer: Medicare Other | Source: Ambulatory Visit | Attending: Internal Medicine | Admitting: Internal Medicine

## 2011-06-10 DIAGNOSIS — R0602 Shortness of breath: Secondary | ICD-10-CM | POA: Insufficient documentation

## 2011-06-10 MED ORDER — ALBUTEROL SULFATE (5 MG/ML) 0.5% IN NEBU
2.5000 mg | INHALATION_SOLUTION | Freq: Once | RESPIRATORY_TRACT | Status: AC
  Administered 2011-06-10: 2.5 mg via RESPIRATORY_TRACT

## 2011-06-13 ENCOUNTER — Emergency Department (HOSPITAL_COMMUNITY)
Admission: EM | Admit: 2011-06-13 | Discharge: 2011-06-13 | Disposition: A | Discharge status: Home / Self Care | Payer: Medicare Other | Attending: Emergency Medicine | Admitting: Emergency Medicine

## 2011-06-13 ENCOUNTER — Encounter (HOSPITAL_COMMUNITY): Payer: Self-pay | Admitting: Emergency Medicine

## 2011-06-13 ENCOUNTER — Emergency Department (HOSPITAL_COMMUNITY): Payer: Medicare Other

## 2011-06-13 DIAGNOSIS — Z8639 Personal history of other endocrine, nutritional and metabolic disease: Secondary | ICD-10-CM | POA: Insufficient documentation

## 2011-06-13 DIAGNOSIS — E119 Type 2 diabetes mellitus without complications: Secondary | ICD-10-CM | POA: Insufficient documentation

## 2011-06-13 DIAGNOSIS — S0180XA Unspecified open wound of other part of head, initial encounter: Secondary | ICD-10-CM | POA: Insufficient documentation

## 2011-06-13 DIAGNOSIS — W19XXXA Unspecified fall, initial encounter: Secondary | ICD-10-CM

## 2011-06-13 DIAGNOSIS — M542 Cervicalgia: Secondary | ICD-10-CM | POA: Insufficient documentation

## 2011-06-13 DIAGNOSIS — IMO0002 Reserved for concepts with insufficient information to code with codable children: Secondary | ICD-10-CM | POA: Insufficient documentation

## 2011-06-13 DIAGNOSIS — Z862 Personal history of diseases of the blood and blood-forming organs and certain disorders involving the immune mechanism: Secondary | ICD-10-CM | POA: Insufficient documentation

## 2011-06-13 DIAGNOSIS — R296 Repeated falls: Secondary | ICD-10-CM | POA: Insufficient documentation

## 2011-06-13 DIAGNOSIS — Z86718 Personal history of other venous thrombosis and embolism: Secondary | ICD-10-CM | POA: Insufficient documentation

## 2011-06-13 DIAGNOSIS — Z7901 Long term (current) use of anticoagulants: Secondary | ICD-10-CM | POA: Insufficient documentation

## 2011-06-13 DIAGNOSIS — Z79899 Other long term (current) drug therapy: Secondary | ICD-10-CM | POA: Insufficient documentation

## 2011-06-13 DIAGNOSIS — R51 Headache: Secondary | ICD-10-CM | POA: Insufficient documentation

## 2011-06-13 DIAGNOSIS — I509 Heart failure, unspecified: Secondary | ICD-10-CM | POA: Insufficient documentation

## 2011-06-13 DIAGNOSIS — S0083XA Contusion of other part of head, initial encounter: Secondary | ICD-10-CM

## 2011-06-13 DIAGNOSIS — R55 Syncope and collapse: Secondary | ICD-10-CM | POA: Insufficient documentation

## 2011-06-13 DIAGNOSIS — K219 Gastro-esophageal reflux disease without esophagitis: Secondary | ICD-10-CM | POA: Insufficient documentation

## 2011-06-13 DIAGNOSIS — I2510 Atherosclerotic heart disease of native coronary artery without angina pectoris: Secondary | ICD-10-CM | POA: Insufficient documentation

## 2011-06-13 DIAGNOSIS — S01409A Unspecified open wound of unspecified cheek and temporomandibular area, initial encounter: Secondary | ICD-10-CM | POA: Insufficient documentation

## 2011-06-13 DIAGNOSIS — I4891 Unspecified atrial fibrillation: Secondary | ICD-10-CM | POA: Insufficient documentation

## 2011-06-13 DIAGNOSIS — S022XXA Fracture of nasal bones, initial encounter for closed fracture: Secondary | ICD-10-CM | POA: Insufficient documentation

## 2011-06-13 DIAGNOSIS — I10 Essential (primary) hypertension: Secondary | ICD-10-CM | POA: Insufficient documentation

## 2011-06-13 DIAGNOSIS — S0003XA Contusion of scalp, initial encounter: Secondary | ICD-10-CM | POA: Insufficient documentation

## 2011-06-13 LAB — DIFFERENTIAL
Eosinophils Absolute: 0.1 10*3/uL (ref 0.0–0.7)
Eosinophils Relative: 2 % (ref 0–5)
Lymphocytes Relative: 23 % (ref 12–46)
Lymphs Abs: 1.7 10*3/uL (ref 0.7–4.0)
Monocytes Relative: 8 % (ref 3–12)

## 2011-06-13 LAB — URINALYSIS, ROUTINE W REFLEX MICROSCOPIC
Ketones, ur: NEGATIVE mg/dL
Leukocytes, UA: NEGATIVE
Nitrite: NEGATIVE
pH: 7 (ref 5.0–8.0)

## 2011-06-13 LAB — CBC
HCT: 43.7 % (ref 39.0–52.0)
Hemoglobin: 15.1 g/dL (ref 13.0–17.0)
MCH: 34.2 pg — ABNORMAL HIGH (ref 26.0–34.0)
MCV: 98.9 fL (ref 78.0–100.0)
RBC: 4.42 MIL/uL (ref 4.22–5.81)
WBC: 7.5 10*3/uL (ref 4.0–10.5)

## 2011-06-13 LAB — BASIC METABOLIC PANEL
Chloride: 99 mEq/L (ref 96–112)
Creatinine, Ser: 1.81 mg/dL — ABNORMAL HIGH (ref 0.50–1.35)
GFR calc Af Amer: 42 mL/min — ABNORMAL LOW (ref >90)
Potassium: 4.1 mEq/L (ref 3.5–5.1)

## 2011-06-13 NOTE — ED Provider Notes (Signed)
History   This chart was scribed for Nat Christen, MD by Carolyne Littles. The patient was seen in room APA02/APA02. Patient's care was started at 1008.    CSN: [id number]  Arrival date & time 06/13/11  1008   First MD Initiated Contact with Patient 06/13/11 1021      Chief Complaint  Patient presents with  . Fall    (Consider location/radiation/quality/duration/timing/severity/associated sxs/prior treatment) HPI Troy [name] is a 71 y.o. male who presents to the Emergency Department to be evaluated following a syncopal episode with accompanying fall which occurred this morning. Patient reports he fell face first onto concrete floor sustaining abrasions and lacerations to face. Patient also currently c/o intermittent mild to moderate neck pain and intermittent mild to moderate HA. Denies nausea, vomiting, chest pain, abdominal pain, back pain, blurred vision, weakness, numbness, tingling. Patient with h/o CAD, HTN, diabetes, prostatitis, DVT, atrial fibrillation, CHF.  Past Medical History  Diagnosis Date  . CAD (coronary artery disease) 1996    status post PCI of the RCA   . Ischemic dilated cardiomyopathy   . HTN (hypertension)   . GERD (gastroesophageal reflux disease)   . Personal history of DVT (deep vein thrombosis)   . Diabetes mellitus, type 2   . DJD (degenerative joint disease)   . Gout   . Prostatitis   . Nephrolithiasis   . Atrial fibrillation   . Congestive heart failure, unspecified   . Other primary cardiomyopathies     History reviewed. No pertinent past surgical history.  History reviewed. No pertinent family history.  History  Substance Use Topics  . Smoking status: Former Smoker    Quit date: 01/22/1980  . Smokeless tobacco: Not on file  . Alcohol Use: No     denies      Review of Systems A complete 10 system review of systems was obtained and all systems are negative except as noted in the HPI and PMH.   Allergies  Benadryl  Home  Medications   Current Outpatient Rx  Name Route Sig Dispense Refill  . ALLOPURINOL 100 MG PO TABS Oral Take 100 mg by mouth daily.    . AMINOPHYLLINE 200 MG PO TABS Oral Take 200 mg by mouth daily.    Marland Kitchen CARVEDILOL 12.5 MG PO TABS Oral Take 12.5 mg by mouth 2 (two) times daily with a meal.    . DIGOXIN 0.125 MG PO TABS Oral Take 125 mcg by mouth daily.    Marland Kitchen LORATADINE 10 MG PO TABS Oral Take 10 mg by mouth daily as needed.    Marland Kitchen LOSARTAN POTASSIUM 50 MG PO TABS Oral Take 50 mg by mouth daily.    . TORSEMIDE 20 MG PO TABS Oral Take 20 mg by mouth 2 (two) times daily.    . WARFARIN SODIUM 1 MG PO TABS Oral Take 2-3 mg by mouth daily. Patient takes 2 tablets on Monday,Wednesday,Friday and 3 tablets on Tuesday,Thursday,Saturday,Sunday!!!      BP 159/64  Pulse 60  Temp(Src) 97.6 F (36.4 C) (Oral)  Resp 18  Ht 5\' 6"  (1.676 m)  Wt 176 lb (79.833 kg)  BMI 28.41 kg/m2  SpO2 98%  Physical Exam  Nursing note and vitals reviewed. Constitutional: He is oriented to person, place, and time. He appears well-developed and well-nourished. No distress.  HENT:  Head: Normocephalic.       0.5cm vertical laceration between eye brows situated more to left. 29mm laceration to left lateral cheek. Dentition intact.   Eyes:  EOM are normal. Pupils are equal, round, and reactive to light.  Neck: Neck supple. No tracheal deviation present.  Cardiovascular: Normal rate and regular rhythm.  Exam reveals no gallop and no friction rub.   No murmur heard. Pulmonary/Chest: Effort normal. No respiratory distress. He has no wheezes. He has no rales.  Abdominal: Soft. He exhibits no distension.  Musculoskeletal: Normal range of motion. He exhibits no edema and no tenderness.       No bony tenderness noted.   Neurological: He is alert and oriented to person, place, and time. No sensory deficit.  Skin: Skin is warm and dry.  Psychiatric: He has a normal mood and affect. His behavior is normal.    ED Course    Procedures (including critical care time)  DIAGNOSTIC STUDIES: Oxygen Saturation is 98% on room air, normal by my interpretation.    COORDINATION OF CARE: 10:47AM- Will obtain EKG, blood labs, UA, CT-Head. Patient agrees with plan at this time.    Labs Reviewed  CBC - Abnormal; Notable for the following:    MCH 34.2 (*)    Platelets 114 (*)    All other components within normal limits  BASIC METABOLIC PANEL - Abnormal; Notable for the following:    CO2 34 (*)    Glucose, Bld 100 (*)    Creatinine, Ser 1.81 (*)    GFR calc non Af Amer 36 (*)    GFR calc Af Amer 42 (*)    All other components within normal limits  DIFFERENTIAL  URINALYSIS, ROUTINE W REFLEX MICROSCOPIC   Ct Head Wo Contrast  06/13/2011  *RADIOLOGY REPORT*  Clinical Data:  [place] onto face on concrete, does not remember what happened, left frontal laceration and hematoma, headache, history hypertension, diabetes, coronary artery disease  CT HEAD WITHOUT CONTRAST CT MAXILLOFACIAL WITHOUT CONTRAST  Technique:  Multidetector CT imaging of the head and maxillofacial structures were performed using the standard protocol without intravenous contrast. Multiplanar CT image reconstructions of the maxillofacial structures were also generated.  Comparison:  None  CT HEAD  Findings: Minimal age-related atrophy. Normal ventricular morphology. No midline shift or mass effect. Small focus of periventricular white matter lucency in right frontal region question tiny old infarct. No intracranial hemorrhage, mass lesion, or evidence of acute infarction. No definite extra-axial fluid collections. Minimal streak artifact. Calvaria appear intact.  IMPRESSION: Suspect tiny old right frontal periventricular white matter infarct. No definite acute intracranial abnormalities.  CT MAXILLOFACIAL  Findings: Visualized intracranial structures unremarkable. Left supraorbital scalp hematoma with several tiny nonspecific radiopacities suspect tiny soft tissue  calcifications. Intraorbital soft tissue planes clear. Mild deformity of left nasal bone consistent with age indeterminate fracture. Bones appear slightly demineralized. Atherosclerotic calcification of bilateral carotid systems. Mild nasal septal deviation to the left. Minimal mucosal thickening within left maxillary sinus. No additional facial bone or sinus fracture identified.  IMPRESSION: Age indeterminate fracture left nasal bone. No other facial bony abnormalities identified. Left frontal scalp hematoma.  Original Report Authenticated By: Burnetta Sabin, M.D.   Ct Maxillofacial Wo Cm  06/13/2011  *RADIOLOGY REPORT*  Clinical Data:  [place] onto face on concrete, does not remember what happened, left frontal laceration and hematoma, headache, history hypertension, diabetes, coronary artery disease  CT HEAD WITHOUT CONTRAST CT MAXILLOFACIAL WITHOUT CONTRAST  Technique:  Multidetector CT imaging of the head and maxillofacial structures were performed using the standard protocol without intravenous contrast. Multiplanar CT image reconstructions of the maxillofacial structures were also generated.  Comparison:  None  CT HEAD  Findings: Minimal age-related atrophy. Normal ventricular morphology. No midline shift or mass effect. Small focus of periventricular white matter lucency in right frontal region question tiny old infarct. No intracranial hemorrhage, mass lesion, or evidence of acute infarction. No definite extra-axial fluid collections. Minimal streak artifact. Calvaria appear intact.  IMPRESSION: Suspect tiny old right frontal periventricular white matter infarct. No definite acute intracranial abnormalities.  CT MAXILLOFACIAL  Findings: Visualized intracranial structures unremarkable. Left supraorbital scalp hematoma with several tiny nonspecific radiopacities suspect tiny soft tissue calcifications. Intraorbital soft tissue planes clear. Mild deformity of left nasal bone consistent with age indeterminate  fracture. Bones appear slightly demineralized. Atherosclerotic calcification of bilateral carotid systems. Mild nasal septal deviation to the left. Minimal mucosal thickening within left maxillary sinus. No additional facial bone or sinus fracture identified.  IMPRESSION: Age indeterminate fracture left nasal bone. No other facial bony abnormalities identified. Left frontal scalp hematoma.  Original Report Authenticated By: Burnetta Sabin, M.D.     No diagnosis found.   Date: 06/13/2011  Rate: 66  Rhythm: pacemaker  QRS Axis: normal  Intervals: normal  ST/T Wave abnormalities: normal  Conduction Disutrbances:none  Narrative Interpretation:   Old EKG Reviewed: none available   MDM  Patient states he has a infrequent syncopal spell. He has been worked up by his primary care Dr.    He is alert and oriented. No new neurological deficits.      I personally performed the services described in this documentation, which was scribed in my presence. The recorded information has been reviewed and considered.    Nat Christen, MD 06/13/11 1425

## 2011-06-13 NOTE — ED Notes (Signed)
Pt fall face forward on concrete this am. Pt does not know what happened. Pt has lac and hematoma to the left forehead.

## 2011-06-13 NOTE — ED Notes (Signed)
No change 

## 2011-06-13 NOTE — Discharge Instructions (Signed)
X-rays show a nasal fracture.  increase fluids. Rest. Followup your primary care Dr.

## 2011-06-19 ENCOUNTER — Other Ambulatory Visit: Payer: Self-pay | Admitting: Internal Medicine

## 2011-06-19 DIAGNOSIS — R55 Syncope and collapse: Secondary | ICD-10-CM

## 2011-06-25 ENCOUNTER — Other Ambulatory Visit: Payer: Medicare Other

## 2011-07-14 ENCOUNTER — Encounter (HOSPITAL_COMMUNITY): Payer: Self-pay | Admitting: Emergency Medicine

## 2011-07-14 ENCOUNTER — Emergency Department (HOSPITAL_COMMUNITY)
Admission: EM | Admit: 2011-07-14 | Discharge: 2011-07-14 | Disposition: A | Discharge status: Home / Self Care | Payer: Medicare Other | Attending: Emergency Medicine | Admitting: Emergency Medicine

## 2011-07-14 DIAGNOSIS — E119 Type 2 diabetes mellitus without complications: Secondary | ICD-10-CM | POA: Insufficient documentation

## 2011-07-14 DIAGNOSIS — H1130 Conjunctival hemorrhage, unspecified eye: Secondary | ICD-10-CM

## 2011-07-14 DIAGNOSIS — H44819 Hemophthalmos, unspecified eye: Secondary | ICD-10-CM | POA: Insufficient documentation

## 2011-07-14 DIAGNOSIS — M109 Gout, unspecified: Secondary | ICD-10-CM | POA: Insufficient documentation

## 2011-07-14 DIAGNOSIS — I10 Essential (primary) hypertension: Secondary | ICD-10-CM | POA: Insufficient documentation

## 2011-07-14 DIAGNOSIS — I2510 Atherosclerotic heart disease of native coronary artery without angina pectoris: Secondary | ICD-10-CM | POA: Insufficient documentation

## 2011-07-14 DIAGNOSIS — I509 Heart failure, unspecified: Secondary | ICD-10-CM | POA: Insufficient documentation

## 2011-07-14 DIAGNOSIS — I4891 Unspecified atrial fibrillation: Secondary | ICD-10-CM | POA: Insufficient documentation

## 2011-07-14 DIAGNOSIS — Z87891 Personal history of nicotine dependence: Secondary | ICD-10-CM | POA: Insufficient documentation

## 2011-07-14 DIAGNOSIS — Z79899 Other long term (current) drug therapy: Secondary | ICD-10-CM | POA: Insufficient documentation

## 2011-07-14 DIAGNOSIS — K219 Gastro-esophageal reflux disease without esophagitis: Secondary | ICD-10-CM | POA: Insufficient documentation

## 2011-07-14 DIAGNOSIS — I428 Other cardiomyopathies: Secondary | ICD-10-CM | POA: Insufficient documentation

## 2011-07-14 DIAGNOSIS — Z86718 Personal history of other venous thrombosis and embolism: Secondary | ICD-10-CM | POA: Insufficient documentation

## 2011-07-14 DIAGNOSIS — Z7901 Long term (current) use of anticoagulants: Secondary | ICD-10-CM | POA: Insufficient documentation

## 2011-07-14 DIAGNOSIS — Z888 Allergy status to other drugs, medicaments and biological substances status: Secondary | ICD-10-CM | POA: Insufficient documentation

## 2011-07-14 LAB — DIFFERENTIAL
Basophils Absolute: 0 10*3/uL (ref 0.0–0.1)
Basophils Relative: 0 % (ref 0–1)
Neutro Abs: 4 10*3/uL (ref 1.7–7.7)
Neutrophils Relative %: 65 % (ref 43–77)

## 2011-07-14 LAB — CBC
Hemoglobin: 13.8 g/dL (ref 13.0–17.0)
MCHC: 34 g/dL (ref 30.0–36.0)
Platelets: 125 10*3/uL — ABNORMAL LOW (ref 150–400)
RDW: 14.1 % (ref 11.5–15.5)

## 2011-07-14 LAB — PROTIME-INR
INR: 2.03 — ABNORMAL HIGH (ref 0.00–1.49)
Prothrombin Time: 23.3 seconds — ABNORMAL HIGH (ref 11.6–15.2)

## 2011-07-14 MED ORDER — FLUORESCEIN SODIUM 1 MG OP STRP
ORAL_STRIP | OPHTHALMIC | Status: AC
  Filled 2011-07-14: qty 1

## 2011-07-14 NOTE — ED Notes (Signed)
Right eye red, denies injury but thinks he may have scratched it during his sleep last night, denies decrease in vision, states it's irriated

## 2011-07-14 NOTE — ED Notes (Signed)
MD at bedside. 

## 2011-07-14 NOTE — Discharge Instructions (Signed)
Subconjunctival Hemorrhage Your exam shows you have a subconjunctival hemorrhage. This is a harmless collection of blood covering a portion of the white of the eye. This condition may be due to injury or to straining (lifting, sneezing, or coughing). Often, there is no known cause. Subconjunctival blood does not cause pain or vision problems. This condition needs no treatment. It will take 1 to 2 weeks for the blood to dissolve. If you take aspirin or Coumadin on a daily basis or if you have high blood pressure, you should check with your doctor about the need for further treatment. Please call your doctor if you have problems with your vision, pain around the eye, or any other concerns about your condition. Document Released: 02/15/2004 Document Revised: 12/27/2010 Document Reviewed: 12/05/2008 [facility] Patient Information 2012 [place].  Your Coumadin level was an INR of 2.03 which is appropriate. Return for any visual changes in the higher blood in over-the-counter part of the eye otherwise followup with your primary care Dr. In the next few days.

## 2011-07-14 NOTE — ED Provider Notes (Signed)
History   This chart was scribed for Troy Kung, MD by Carolyne Littles. The patient was seen in room APA03/APA03. Patient's care was started at 1145.    CSN: [id number]  Arrival date & time 07/14/11  1145   First MD Initiated Contact with Patient 07/14/11 1206      Chief Complaint  Patient presents with  . Eye Problem    (Consider location/radiation/quality/duration/timing/severity/associated sxs/prior treatment) HPI Troy [name] is a 71 y.o. male who presents to the Emergency Department complaining of constant moderate to severe redness to sclera of right eye with mild soreness to right eye onset this morning and persistent since. Denies sneezing, coughing, pruritus, blurred vision, visual disturbances, HA, nausea, vomiting, chest pain, SOB neck pain, abdominal pain, back pain, dysuria, hematuria. Patient with h/o CAD, HTN, cardiomyopathy, diabetes-Type 2, DVT, gout, prostatitis, a-fib, CHF. Patient is currently on Coumadin.  PCP- Kalman Drape in [place], [place]  Past Medical History  Diagnosis Date  . CAD (coronary artery disease) 1996    status post PCI of the RCA   . Ischemic dilated cardiomyopathy   . HTN (hypertension)   . GERD (gastroesophageal reflux disease)   . Personal history of DVT (deep vein thrombosis)   . Diabetes mellitus, type 2   . DJD (degenerative joint disease)   . Gout   . Prostatitis   . Nephrolithiasis   . Atrial fibrillation   . Congestive heart failure, unspecified   . Other primary cardiomyopathies     History reviewed. No pertinent past surgical history.  No family history on file.  History  Substance Use Topics  . Smoking status: Former Smoker    Quit date: 01/22/1980  . Smokeless tobacco: Not on file  . Alcohol Use: No     denies      Review of Systems  Constitutional: Negative for fever and chills.  HENT: Negative for rhinorrhea and neck pain.   Eyes: Positive for pain.       Redness to right eye  Respiratory:  Negative for cough and shortness of breath.   Cardiovascular: Negative for chest pain.  Gastrointestinal: Negative for nausea, vomiting, abdominal pain and diarrhea.  Genitourinary: Negative for dysuria.  Musculoskeletal: Negative for back pain.  Skin: Negative for rash.  Neurological: Negative for dizziness and weakness.    Allergies  Benadryl  Home Medications   Current Outpatient Rx  Name Route Sig Dispense Refill  . ALFUZOSIN HCL ER 10 MG PO TB24 Oral Take 10 mg by mouth daily.    . ALLOPURINOL 100 MG PO TABS Oral Take 100 mg by mouth daily.    . ATORVASTATIN CALCIUM PO Oral Take 1 tablet by mouth at bedtime.    Marland Kitchen CARVEDILOL 12.5 MG PO TABS Oral Take 12.5 mg by mouth 2 (two) times daily with a meal.    . DIGOXIN 0.125 MG PO TABS Oral Take 125 mcg by mouth daily.    Marland Kitchen LORATADINE 10 MG PO TABS Oral Take 10 mg by mouth daily as needed. For allergies    . LOSARTAN POTASSIUM 50 MG PO TABS Oral Take 50 mg by mouth daily.    . TORSEMIDE 20 MG PO TABS Oral Take 40 mg by mouth daily.     . WARFARIN SODIUM 1 MG PO TABS Oral Take 2-3 mg by mouth daily. Patient takes 3 tablets (3mg ) on Monday,Wednesday,Friday and saturdays, then 2 tablets(2mg ) on Tuesday,Thursday,Sunday.    . AMINOPHYLLINE 200 MG PO TABS Oral Take 200 mg by mouth  daily.      BP 157/53  Pulse 60  Temp 97.7 F (36.5 C)  Resp 18  Ht 5\' 5"  (1.651 m)  Wt 175 lb (79.379 kg)  BMI 29.12 kg/m2  SpO2 98%  Physical Exam  Nursing note and vitals reviewed. Constitutional: He is oriented to person, place, and time. He appears well-developed and well-nourished. No distress.  HENT:  Head: Normocephalic and atraumatic.  Mouth/Throat: Oropharynx is clear and moist.       Mucous membranes moist.   Eyes: EOM are normal. Pupils are equal, round, and reactive to light.       Left eye normal. Right eye with scleral hematoma. No blood within anterior chamber of right eye, No hyphema. Pupils 79mm bilaterally and reactive.   Neck: Neck  supple. No tracheal deviation present.  Cardiovascular: Normal rate and regular rhythm.  Exam reveals no gallop and no friction rub.   No murmur heard. Pulmonary/Chest: Effort normal. No respiratory distress. He has no wheezes. He has no rales.  Abdominal: Soft. Bowel sounds are normal. He exhibits no distension. There is no tenderness.  Musculoskeletal: Normal range of motion. He exhibits no edema.  Lymphadenopathy:    He has no cervical adenopathy.  Neurological: He is alert and oriented to person, place, and time. No cranial nerve deficit or sensory deficit.       Distal neurovascular intact.   Skin: Skin is warm and dry.  Psychiatric: He has a normal mood and affect. His behavior is normal.    ED Course  Procedures (including critical care time)  DIAGNOSTIC STUDIES: Oxygen Saturation is 98% on room air, normal by my interpretation.    COORDINATION OF CARE: 12:31PM-Patient informed of current plan for treatment and evaluation and agrees with plan at this time.     Labs Reviewed  CBC - Abnormal; Notable for the following:    RBC 4.08 (*)     Platelets 125 (*)     All other components within normal limits  PROTIME-INR - Abnormal; Notable for the following:    Prothrombin Time 23.3 (*)     INR 2.03 (*)     All other components within normal limits  DIFFERENTIAL   No results found. Results for orders placed during the hospital encounter of 07/14/11  CBC      Component Value Range   WBC 6.2  4.0 - 10.5 K/uL   RBC 4.08 (*) 4.22 - 5.81 MIL/uL   Hemoglobin 13.8  13.0 - 17.0 g/dL   HCT 40.6  39.0 - 52.0 %   MCV 99.5  78.0 - 100.0 fL   MCH 33.8  26.0 - 34.0 pg   MCHC 34.0  30.0 - 36.0 g/dL   RDW 14.1  11.5 - 15.5 %   Platelets 125 (*) 150 - 400 K/uL  DIFFERENTIAL      Component Value Range   Neutrophils Relative 65  43 - 77 %   Neutro Abs 4.0  1.7 - 7.7 K/uL   Lymphocytes Relative 26  12 - 46 %   Lymphs Abs 1.6  0.7 - 4.0 K/uL   Monocytes Relative 7  3 - 12 %    Monocytes Absolute 0.4  0.1 - 1.0 K/uL   Eosinophils Relative 2  0 - 5 %   Eosinophils Absolute 0.1  0.0 - 0.7 K/uL   Basophils Relative 0  0 - 1 %   Basophils Absolute 0.0  0.0 - 0.1 K/uL  PROTIME-INR  Component Value Range   Prothrombin Time 23.3 (*) 11.6 - 15.2 seconds   INR 2.03 (*) 0.00 - 1.49     1. Scleral hemorrhage       MDM  Patient with a right eye scleral hemorrhage no hyphema Coumadin level is normal platelets are normal. No significant anemia. No visual changes.      I personally performed the services described in this documentation, which was scribed in my presence. The recorded information has been reviewed and considered.     Troy Kung, MD 07/14/11 1350

## 2011-07-14 NOTE — ED Notes (Signed)
Pt c/o right eye redness this am.

## 2011-09-06 ENCOUNTER — Ambulatory Visit (INDEPENDENT_AMBULATORY_CARE_PROVIDER_SITE_OTHER): Payer: Medicare Other | Admitting: Internal Medicine

## 2011-09-06 ENCOUNTER — Encounter: Payer: Self-pay | Admitting: Internal Medicine

## 2011-09-06 VITALS — BP 138/60 | HR 63 | Ht 65.0 in | Wt 176.8 lb

## 2011-09-06 DIAGNOSIS — I2510 Atherosclerotic heart disease of native coronary artery without angina pectoris: Secondary | ICD-10-CM

## 2011-09-06 DIAGNOSIS — Z95810 Presence of automatic (implantable) cardiac defibrillator: Secondary | ICD-10-CM

## 2011-09-06 DIAGNOSIS — I509 Heart failure, unspecified: Secondary | ICD-10-CM

## 2011-09-06 DIAGNOSIS — I428 Other cardiomyopathies: Secondary | ICD-10-CM

## 2011-09-06 LAB — ICD DEVICE OBSERVATION
AL AMPLITUDE: 3.5 mv
AL IMPEDENCE ICD: 462.5 Ohm
BAMS-0003: 70 {beats}/min
HV IMPEDENCE: 51 Ohm
LV LEAD IMPEDENCE ICD: 1000 Ohm
LV LEAD THRESHOLD: 1.375 V
TOT-0006: 20120801000000
TOT-0007: 3
TOT-0008: 0
TOT-0010: 5
VF: 0

## 2011-09-06 NOTE — Assessment & Plan Note (Signed)
He denies anginal symptoms. No change in medical therapy. I've encouraged him to increase his physical activity.

## 2011-09-06 NOTE — Progress Notes (Signed)
HPI Troy [name] returns today for followup. He is a very pleasant 71 year old man with an ischemic cardiomyopathy, chronic systolic heart failure, status post biventricular ICD implantation. In the interim he has done well. He denies chest pain or shortness of breath. No syncope. No recent ICD discharge. No peripheral edema. Allergies  Allergen Reactions  . Benadryl (Diphenhydramine Hcl) Nausea And Vomiting     Current Outpatient Prescriptions  Medication Sig Dispense Refill  . alfuzosin (UROXATRAL) 10 MG 24 hr tablet Take 10 mg by mouth daily.      Troy [name] Kitchen allopurinol (ZYLOPRIM) 100 MG tablet Take 100 mg by mouth daily.      . ATORVASTATIN CALCIUM PO Take 1 tablet by mouth at bedtime.      . carvedilol (COREG) 12.5 MG tablet Take 12.5 mg by mouth 2 (two) times daily with a meal.      . digoxin (LANOXIN) 0.125 MG tablet Take 125 mcg by mouth daily.      Troy [name] Kitchen loratadine (CLARITIN) 10 MG tablet Take 10 mg by mouth daily as needed. For allergies      . losartan (COZAAR) 50 MG tablet Take 50 mg by mouth daily.      Troy [name] Kitchen torsemide (DEMADEX) 20 MG tablet Take 40 mg by mouth daily.       Troy [name] Kitchen warfarin (COUMADIN) 1 MG tablet Take 2-3 mg by mouth daily. Patient takes 3 tablets (3mg ) on Monday,Wednesday,Friday and saturdays, then 2 tablets(2mg ) on Tuesday,Thursday,Sunday.      Troy [name] Kitchen aminophylline 200 MG tablet Take 200 mg by mouth daily.         Past Medical History  Diagnosis Date  . CAD (coronary artery disease) 1996    status post PCI of the RCA   . Ischemic dilated cardiomyopathy   . HTN (hypertension)   . GERD (gastroesophageal reflux disease)   . Personal history of DVT (deep vein thrombosis)   . Diabetes mellitus, type 2   . DJD (degenerative joint disease)   . Gout   . Prostatitis   . Nephrolithiasis   . Atrial fibrillation   . Congestive heart failure, unspecified   . Other primary cardiomyopathies     ROS:   All systems reviewed and negative except as noted in the HPI.   No past surgical  history on file.   No family history on file.   History   Social History  . Marital Status: Single    Spouse Name: N/A    Number of Children: N/A  . Years of Education: N/A   Occupational History  . Not on file.   Social History Main Topics  . Smoking status: Former Smoker    Quit date: 01/22/1980  . Smokeless tobacco: Not on file  . Alcohol Use: No     denies  . Drug Use: Not on file  . Sexually Active: Not on file   Other Topics Concern  . Not on file   Social History Narrative  . No narrative on file     BP 138/60  Pulse 63  Ht 5\' 5"  (1.651 m)  Wt 176 lb 12 oz (80.173 kg)  BMI 29.41 kg/m2  Physical Exam:  Well appearing 71 year old man, NAD HEENT: Unremarkable Neck:  7 cm JVD, no thyromegally Lungs:  Clear with no wheezes, rales, or rhonchi. Well-healed ICD incision. HEART:  Regular rate rhythm, no murmurs, no rubs, no clicks Abd:  soft, positive bowel sounds, no organomegally, no rebound, no guarding Ext:  2 plus pulses, no edema, no  cyanosis, no clubbing Skin:  No rashes no nodules Neuro:  CN II through XII intact, motor grossly intact  DEVICE  Normal device function.  See PaceArt for details.   Assess/Plan:

## 2011-09-06 NOTE — Assessment & Plan Note (Signed)
His chronic systolic heart failure is currently well compensated, class II.

## 2011-09-06 NOTE — Assessment & Plan Note (Signed)
His device is working normally. We'll plan to recheck in several months. 

## 2011-09-10 ENCOUNTER — Encounter: Payer: Self-pay | Admitting: Internal Medicine

## 2011-09-11 ENCOUNTER — Encounter (INDEPENDENT_AMBULATORY_CARE_PROVIDER_SITE_OTHER): Payer: Self-pay | Admitting: General Surgery

## 2011-09-11 ENCOUNTER — Ambulatory Visit (INDEPENDENT_AMBULATORY_CARE_PROVIDER_SITE_OTHER): Payer: Medicare Other | Admitting: General Surgery

## 2011-09-11 VITALS — BP 142/68 | HR 64 | Temp 97.2°F | Resp 12 | Ht 65.0 in | Wt 177.2 lb

## 2011-09-11 DIAGNOSIS — D1739 Benign lipomatous neoplasm of skin and subcutaneous tissue of other sites: Secondary | ICD-10-CM

## 2011-09-11 DIAGNOSIS — D170 Benign lipomatous neoplasm of skin and subcutaneous tissue of head, face and neck: Secondary | ICD-10-CM | POA: Insufficient documentation

## 2011-09-11 NOTE — Patient Instructions (Signed)
We have to get cardiology clearance before we can schedule your surgery. Please contact our office early next week if you have not heard from our office regarding surgery

## 2011-09-11 NOTE — Progress Notes (Signed)
Patient ID: Troy [name], male   DOB: [birth year], 71 y.o.   MRN: [id number]  Chief Complaint  Patient presents with  . Pre-op Exam    eval lipoma on back of neck    HPI Troy [name] is a 71 y.o. male.   HPI 71 year old Caucasian male referred by Dr. Volanda Napoleon for evaluation of a lipoma.the patient states that this area on the back of his neck has been present for at least several years. He states that it has slowly gotten larger over time. It is now starting to bother him more especially when he turns his neck very rapidly.he denies any injury to the area. He denies any weight loss. He denies any redness, swelling, or drainage from the area. He denies any lymphadenopathy. He denies any other masses.   Past Medical History  Diagnosis Date  . CAD (coronary artery disease) 1996    status post PCI of the RCA   . Ischemic dilated cardiomyopathy   . HTN (hypertension)   . GERD (gastroesophageal reflux disease)   . Personal history of DVT (deep vein thrombosis)   . Diabetes mellitus, type 2   . DJD (degenerative joint disease)   . Gout   . Prostatitis   . Nephrolithiasis   . Atrial fibrillation   . Congestive heart failure, unspecified   . Other primary cardiomyopathies     Past Surgical History  Procedure Date  . Back surgery     Family History  Problem Relation Age of Onset  . Cancer Father     colon  . Stroke Paternal Uncle     Social History History  Substance Use Topics  . Smoking status: Former Smoker    Quit date: 01/22/1980  . Smokeless tobacco: Never Used  . Alcohol Use: No     denies    Allergies  Allergen Reactions  . Benadryl (Diphenhydramine Hcl) Nausea And Vomiting    Current Outpatient Prescriptions  Medication Sig Dispense Refill  . alfuzosin (UROXATRAL) 10 MG 24 hr tablet Take 10 mg by mouth daily.      Marland Kitchen allopurinol (ZYLOPRIM) 100 MG tablet Take 100 mg by mouth daily.      Marland Kitchen aminophylline 200 MG tablet Take 200 mg by mouth daily.      .  ATORVASTATIN CALCIUM PO Take 1 tablet by mouth at bedtime.      . carvedilol (COREG) 12.5 MG tablet Take 12.5 mg by mouth 2 (two) times daily with a meal.      . digoxin (LANOXIN) 0.125 MG tablet Take 125 mcg by mouth daily.      Marland Kitchen loratadine (CLARITIN) 10 MG tablet Take 10 mg by mouth daily as needed. For allergies      . losartan (COZAAR) 50 MG tablet Take 50 mg by mouth daily.      Marland Kitchen torsemide (DEMADEX) 20 MG tablet Take 40 mg by mouth daily.       Marland Kitchen warfarin (COUMADIN) 1 MG tablet Take 2-3 mg by mouth daily. Patient takes 3 tablets (3mg ) on Monday,Wednesday,Friday and saturdays, then 2 tablets(2mg ) on Tuesday,Thursday,Sunday.        Review of Systems Review of Systems  Constitutional: Negative for fever, chills, appetite change and unexpected weight change.  HENT: Negative for congestion and trouble swallowing.   Eyes: Negative for visual disturbance.  Respiratory: Negative for chest tightness and shortness of breath.   Cardiovascular: Negative for chest pain and leg swelling.       No PND, no  orthopnea, some DOE  Gastrointestinal:       See HPI  Genitourinary: Positive for frequency. Negative for dysuria and hematuria.  Musculoskeletal: Negative.   Skin: Negative for rash.  Neurological: Negative for seizures and speech difficulty.  Hematological: Does not bruise/bleed easily.  Psychiatric/Behavioral: Negative for behavioral problems and confusion.    Blood pressure 142/68, pulse 64, temperature 97.2 F (36.2 C), temperature source Temporal, resp. rate 12, height 5\' 5"  (1.651 m), weight 177 lb 3.2 oz (80.377 kg).  Physical Exam Physical Exam  Vitals reviewed. Constitutional: He is oriented to person, place, and time. He appears well-developed and well-nourished. No distress.  HENT:  Head: Normocephalic.  Right Ear: External ear normal.  Left Ear: External ear normal.  Eyes: Conjunctivae are normal. No scleral icterus.  Neck: Normal range of motion. Neck supple. No tracheal  deviation present. No thyromegaly present.         Right posterior neck at base of hairline - 6x4 cm subcu mass, well circumscribed, no skin changes. Mobile. C/w lipoma; has small 1x0.5cm left posterior sebaceous cyst  Cardiovascular: Normal rate, regular rhythm and normal heart sounds.   Pulmonary/Chest: Effort normal and breath sounds normal. No respiratory distress. He has no wheezes.    Abdominal: Soft. He exhibits no distension.  Musculoskeletal: He exhibits no edema.  Lymphadenopathy:    He has no cervical adenopathy.  Neurological: He is alert and oriented to person, place, and time. He exhibits normal muscle tone.  Skin: Skin is warm and dry. No rash noted. He is not diaphoretic. No erythema.  Psychiatric: He has a normal mood and affect. His behavior is normal. Judgment and thought content normal.    Data Reviewed Dr Tanna Furry most recent note  Assessment    Patient Active Problem List  Diagnosis  . Chronic systolic CHF (congestive heart failure)  . Other primary cardiomyopathies  . ICD (implantable cardiac defibrillator), biventricular, in situ  . Coronary artery disease  . Atrial fibrillation  . Lipoma of neck       Plan    We discussed the etiology and differential diagnosis for this subcutaneous mass in his posterior neck. On physical exam it is most consistent with a lipoma. We discussed the etiology and management of lipomas. Patient was given Neurosurgeon. We discussed observation versus surgical excision. We discussed the risks and benefits of surgery including but not limited to, infection, injury to surrounding structures, seroma formation, hematoma formation, scarring, blood clot formation, cardiac and pulmonary issues. I explained that we would have to have cardiology clearance prior to scheduling surgery. I believe he will be cleared by cardiology; however, ideally we would also need to stop his Coumadin perioperatively. We will also ask permission for  that. We discussed the typical postoperative recovery course. I explained that my suspicion for this being a malignancy  would be very rare. He has elected to proceed to the operating room. Once we obtain cardiology clearance and direction of how to manage his Coumadin perioperatively we will schedule him for excision posterior neck subcutaneous mass.  Leighton Ruff. Redmond Pulling, MD, FACS General, Bariatric, & Minimally Invasive Surgery [facility], [place]        [facility] M 09/11/2011, 2:49 PM

## 2011-09-17 ENCOUNTER — Encounter (INDEPENDENT_AMBULATORY_CARE_PROVIDER_SITE_OTHER): Payer: Self-pay

## 2011-09-17 ENCOUNTER — Telehealth (INDEPENDENT_AMBULATORY_CARE_PROVIDER_SITE_OTHER): Payer: Self-pay | Admitting: General Surgery

## 2011-09-17 NOTE — Telephone Encounter (Signed)
Tried to call patient at both numbers provided. No answer. Voicemail not set up on either line. Trying to make patient aware he is cleared for surgery and can stop coumadin 5 days prior. Will try again later.

## 2011-09-18 NOTE — Telephone Encounter (Signed)
Tried to call patient again. No answer.

## 2011-09-19 NOTE — Telephone Encounter (Signed)
Tried both numbers again. No answer. No way to leave message.

## 2011-09-25 NOTE — Telephone Encounter (Signed)
Patient is not answering any calls and his voicemail is not set up. Will await patient's call to discuss surgery.

## 2011-11-27 ENCOUNTER — Encounter: Payer: Self-pay | Admitting: *Deleted

## 2011-12-03 ENCOUNTER — Ambulatory Visit (INDEPENDENT_AMBULATORY_CARE_PROVIDER_SITE_OTHER): Payer: Medicare Other | Admitting: *Deleted

## 2011-12-03 DIAGNOSIS — I5022 Chronic systolic (congestive) heart failure: Secondary | ICD-10-CM

## 2011-12-03 DIAGNOSIS — I509 Heart failure, unspecified: Secondary | ICD-10-CM

## 2011-12-03 DIAGNOSIS — I428 Other cardiomyopathies: Secondary | ICD-10-CM

## 2011-12-03 LAB — ICD DEVICE OBSERVATION
AL AMPLITUDE: 5 mv
DEV-0020ICD: NEGATIVE
DEVICE MODEL ICD: 7001284
FVT: 0
HV IMPEDENCE: 48 Ohm
LV LEAD IMPEDENCE ICD: 1012.5 Ohm
LV LEAD THRESHOLD: 1.875 V
RV LEAD AMPLITUDE: 12 mv
TOT-0007: 3
TOT-0008: 0
TOT-0010: 5
VENTRICULAR PACING ICD: 93 pct
VF: 0

## 2011-12-03 NOTE — Patient Instructions (Addendum)
Return office visit 03/06/12 @ 10:20am

## 2011-12-03 NOTE — Progress Notes (Signed)
ICD check with CorVue 

## 2011-12-23 ENCOUNTER — Encounter: Payer: Self-pay | Admitting: Internal Medicine

## 2012-03-16 ENCOUNTER — Encounter: Payer: Self-pay | Admitting: Internal Medicine

## 2012-03-16 ENCOUNTER — Other Ambulatory Visit: Payer: Self-pay | Admitting: Internal Medicine

## 2012-03-16 ENCOUNTER — Ambulatory Visit (INDEPENDENT_AMBULATORY_CARE_PROVIDER_SITE_OTHER): Payer: Medicare Other | Admitting: *Deleted

## 2012-03-16 DIAGNOSIS — I509 Heart failure, unspecified: Secondary | ICD-10-CM

## 2012-03-16 DIAGNOSIS — I5022 Chronic systolic (congestive) heart failure: Secondary | ICD-10-CM

## 2012-03-16 DIAGNOSIS — I428 Other cardiomyopathies: Secondary | ICD-10-CM

## 2012-03-16 LAB — ICD DEVICE OBSERVATION
AL AMPLITUDE: 5 mv
AL THRESHOLD: 0.625 V
ATRIAL PACING ICD: 71 pct
BAMS-0001: 150 {beats}/min
DEV-0020ICD: NEGATIVE
DEVICE MODEL ICD: 7001284
FVT: 0
LV LEAD IMPEDENCE ICD: 987.5 Ohm
LV LEAD THRESHOLD: 1.125 V
MODE SWITCH EPISODES: 46
PACEART VT: 0
RV LEAD AMPLITUDE: 12 mv
TOT-0008: 0
VENTRICULAR PACING ICD: 93 pct
VF: 0

## 2012-03-16 NOTE — Progress Notes (Signed)
ICD check with CorVue 

## 2012-03-16 NOTE — Patient Instructions (Signed)
Return office visit 05/27/12 @9 :40am with the device clinic.

## 2012-06-08 ENCOUNTER — Ambulatory Visit (INDEPENDENT_AMBULATORY_CARE_PROVIDER_SITE_OTHER): Payer: Medicare Other | Admitting: *Deleted

## 2012-06-08 DIAGNOSIS — I428 Other cardiomyopathies: Secondary | ICD-10-CM

## 2012-06-08 DIAGNOSIS — I509 Heart failure, unspecified: Secondary | ICD-10-CM

## 2012-06-08 DIAGNOSIS — I5022 Chronic systolic (congestive) heart failure: Secondary | ICD-10-CM

## 2012-06-08 LAB — ICD DEVICE OBSERVATION
AL THRESHOLD: 0.625 V
ATRIAL PACING ICD: 69 pct
BAMS-0001: 150 {beats}/min
BAMS-0003: 70 {beats}/min
DEVICE MODEL ICD: 7001284
FVT: 0
LV LEAD IMPEDENCE ICD: 987.5 Ohm
PACEART VT: 0
RV LEAD AMPLITUDE: 12 mv
RV LEAD IMPEDENCE ICD: 575 Ohm
TOT-0006: 20120801000000
TOT-0008: 0
TOT-0009: 1
VENTRICULAR PACING ICD: 86 pct

## 2012-06-08 NOTE — Progress Notes (Signed)
ICD check with CorVue in office.

## 2012-06-24 ENCOUNTER — Encounter: Payer: Self-pay | Admitting: Internal Medicine

## 2012-09-14 ENCOUNTER — Ambulatory Visit (INDEPENDENT_AMBULATORY_CARE_PROVIDER_SITE_OTHER): Payer: Medicare Other | Admitting: Internal Medicine

## 2012-09-14 ENCOUNTER — Encounter: Payer: Self-pay | Admitting: Internal Medicine

## 2012-09-14 VITALS — BP 124/67 | HR 60 | Ht 65.0 in | Wt 184.0 lb

## 2012-09-14 DIAGNOSIS — Z95810 Presence of automatic (implantable) cardiac defibrillator: Secondary | ICD-10-CM

## 2012-09-14 DIAGNOSIS — I5022 Chronic systolic (congestive) heart failure: Secondary | ICD-10-CM

## 2012-09-14 DIAGNOSIS — I428 Other cardiomyopathies: Secondary | ICD-10-CM

## 2012-09-14 DIAGNOSIS — I4891 Unspecified atrial fibrillation: Secondary | ICD-10-CM

## 2012-09-14 DIAGNOSIS — I509 Heart failure, unspecified: Secondary | ICD-10-CM

## 2012-09-14 LAB — ICD DEVICE OBSERVATION
AL AMPLITUDE: 2.3 mv
AL THRESHOLD: 0.75 V
ATRIAL PACING ICD: 64 pct
BAMS-0003: 70 {beats}/min
DEVICE MODEL ICD: 7001284
FVT: 0
PACEART VT: 0
RV LEAD AMPLITUDE: 12 mv
RV LEAD IMPEDENCE ICD: 525 Ohm
TOT-0009: 1
TOT-0010: 8

## 2012-09-14 NOTE — Assessment & Plan Note (Signed)
His [employer] Jude BiV ICD is working normally. Will recheck in several months.  

## 2012-09-14 NOTE — Assessment & Plan Note (Signed)
ICD interrogation demonstrates that he is out of rhythm less than 10% of the time. He will continue his current meds.

## 2012-09-14 NOTE — Patient Instructions (Addendum)
Your physician recommends that you schedule a follow-up appointment in:  1. 1 year with Dr Lovena Le 2. 3 Months with Nevin Bloodgood  Your physician recommends that you continue on your current medications as directed. Please refer to the Current Medication list given to you today.

## 2012-09-14 NOTE — Progress Notes (Signed)
HPI Troy [name] returns today for followup. He is a pleasant 72 yo man with a mixed CM, chronic class 2 CHF, s/p ICD, and PAF. In the interim her has done well. He has mild peripheral edema due to venous insufficiency. He denies syncope or ICD shock. Allergies  Allergen Reactions  . Benadryl [Diphenhydramine Hcl] Nausea And Vomiting     Current Outpatient Prescriptions  Medication Sig Dispense Refill  . alfuzosin (UROXATRAL) 10 MG 24 hr tablet Take 10 mg by mouth daily.      Marland Kitchen allopurinol (ZYLOPRIM) 100 MG tablet Take 100 mg by mouth daily.      Marland Kitchen aminophylline 200 MG tablet Take 200 mg by mouth daily.      . ATORVASTATIN CALCIUM PO Take 1 tablet by mouth at bedtime.      . carvedilol (COREG) 12.5 MG tablet Take 12.5 mg by mouth 2 (two) times daily with a meal.      . digoxin (LANOXIN) 0.125 MG tablet Take 125 mcg by mouth daily.      Marland Kitchen loratadine (CLARITIN) 10 MG tablet Take 10 mg by mouth daily as needed. For allergies      . losartan (COZAAR) 50 MG tablet Take 50 mg by mouth daily.      Marland Kitchen torsemide (DEMADEX) 20 MG tablet Take 40 mg by mouth daily.       . traMADol (ULTRAM) 50 MG tablet       . warfarin (COUMADIN) 1 MG tablet Take 2-3 mg by mouth daily. Patient takes 3 tablets (3mg ) on Monday,Wednesday,Friday and saturdays, then 2 tablets(2mg ) on Tuesday,Thursday,Sunday.       No current facility-administered medications for this visit.     Past Medical History  Diagnosis Date  . CAD (coronary artery disease) 1996    status post PCI of the RCA   . Ischemic dilated cardiomyopathy   . HTN (hypertension)   . GERD (gastroesophageal reflux disease)   . Personal history of DVT (deep vein thrombosis)   . Diabetes mellitus, type 2   . DJD (degenerative joint disease)   . Gout   . Prostatitis   . Nephrolithiasis   . Atrial fibrillation   . Congestive heart failure, unspecified   . Other primary cardiomyopathies     ROS:   All systems reviewed and negative except as noted in the  HPI.   Past Surgical History  Procedure Laterality Date  . Back surgery       Family History  Problem Relation Age of Onset  . Cancer Father     colon  . Stroke Paternal Uncle      History   Social History  . Marital Status: Single    Spouse Name: N/A    Number of Children: N/A  . Years of Education: N/A   Occupational History  . Not on file.   Social History Main Topics  . Smoking status: Former Smoker    Quit date: 01/22/1980  . Smokeless tobacco: Never Used  . Alcohol Use: No     Comment: denies  . Drug Use: No  . Sexual Activity: Not on file   Other Topics Concern  . Not on file   Social History Narrative  . No narrative on file     BP 124/67  Pulse 60  Ht 5\' 5"  (1.651 m)  Wt 184 lb (83.462 kg)  BMI 30.62 kg/m2  Physical Exam:  Well appearing 72 yo man, NAD HEENT: Unremarkable Neck:  7 cm JVD, no  thyromegally Back:  No CVA tenderness Lungs:  Clear with no wheezes, rales, or rhonchi HEART:  Regular rate rhythm, no murmurs, no rubs, no clicks Abd:  soft, positive bowel sounds, no organomegally, no rebound, no guarding Ext:  2 plus pulses, no edema, no cyanosis, no clubbing Skin:  No rashes no nodules Neuro:  CN II through XII intact, motor grossly intact   DEVICE  Normal device function.  See PaceArt for details.   Assess/Plan:

## 2012-11-27 ENCOUNTER — Ambulatory Visit (INDEPENDENT_AMBULATORY_CARE_PROVIDER_SITE_OTHER): Payer: Medicare Other | Admitting: *Deleted

## 2012-11-27 DIAGNOSIS — I509 Heart failure, unspecified: Secondary | ICD-10-CM

## 2012-11-27 DIAGNOSIS — I428 Other cardiomyopathies: Secondary | ICD-10-CM

## 2012-11-27 DIAGNOSIS — I5022 Chronic systolic (congestive) heart failure: Secondary | ICD-10-CM

## 2012-11-27 NOTE — Progress Notes (Signed)
ICD check with CorVue 

## 2012-12-02 LAB — MDC_IDC_ENUM_SESS_TYPE_INCLINIC
Brady Statistic RA Percent Paced: 71 %
Brady Statistic RV Percent Paced: 84 %
Implantable Pulse Generator Model: 3265
Implantable Pulse Generator Serial Number: 7001284
Lead Channel Impedance Value: 412.5 Ohm
Lead Channel Pacing Threshold Amplitude: 0.625 V
Lead Channel Pacing Threshold Amplitude: 1.125 V
Lead Channel Pacing Threshold Pulse Width: 0.5 ms
Lead Channel Pacing Threshold Pulse Width: 0.5 ms
Lead Channel Sensing Intrinsic Amplitude: 5 mV
Lead Channel Setting Pacing Amplitude: 1.625
Lead Channel Setting Pacing Pulse Width: 0.5 ms
Lead Channel Setting Sensing Sensitivity: 0.5 mV

## 2012-12-04 ENCOUNTER — Encounter: Payer: Self-pay | Admitting: Internal Medicine

## 2013-03-03 ENCOUNTER — Ambulatory Visit (INDEPENDENT_AMBULATORY_CARE_PROVIDER_SITE_OTHER): Payer: Medicare HMO | Admitting: *Deleted

## 2013-03-03 DIAGNOSIS — I428 Other cardiomyopathies: Secondary | ICD-10-CM

## 2013-03-03 DIAGNOSIS — I509 Heart failure, unspecified: Secondary | ICD-10-CM

## 2013-03-03 DIAGNOSIS — I5022 Chronic systolic (congestive) heart failure: Secondary | ICD-10-CM

## 2013-03-03 DIAGNOSIS — I4891 Unspecified atrial fibrillation: Secondary | ICD-10-CM

## 2013-03-03 LAB — MDC_IDC_ENUM_SESS_TYPE_INCLINIC
Battery Remaining Longevity: 61.2 mo
HIGH POWER IMPEDANCE MEASURED VALUE: 45.9994
HighPow Impedance: 46 Ohm
Implantable Pulse Generator Serial Number: 7001284
Lead Channel Impedance Value: 400 Ohm
Lead Channel Impedance Value: 525 Ohm
Lead Channel Pacing Threshold Amplitude: 0.75 V
Lead Channel Pacing Threshold Amplitude: 1.25 V
Lead Channel Pacing Threshold Pulse Width: 0.5 ms
Lead Channel Sensing Intrinsic Amplitude: 12 mV
Lead Channel Sensing Intrinsic Amplitude: 5 mV
Lead Channel Setting Pacing Amplitude: 1.75 V
Lead Channel Setting Pacing Amplitude: 2 V
Lead Channel Setting Pacing Pulse Width: 0.5 ms
Lead Channel Setting Sensing Sensitivity: 0.5 mV
MDC IDC MSMT LEADCHNL LV IMPEDANCE VALUE: 912.5 Ohm
MDC IDC MSMT LEADCHNL RA PACING THRESHOLD PULSEWIDTH: 0.5 ms
MDC IDC MSMT LEADCHNL RV PACING THRESHOLD AMPLITUDE: 0.75 V
MDC IDC MSMT LEADCHNL RV PACING THRESHOLD PULSEWIDTH: 0.5 ms
MDC IDC PG MODEL: 3265
MDC IDC SESS DTM: 20150211141026
MDC IDC SET LEADCHNL LV PACING AMPLITUDE: 2.25 V
MDC IDC SET LEADCHNL RV PACING PULSEWIDTH: 0.5 ms
MDC IDC SET ZONE DETECTION INTERVAL: 330 ms
MDC IDC STAT BRADY RA PERCENT PACED: 66 %
MDC IDC STAT BRADY RV PERCENT PACED: 85 %

## 2013-03-03 NOTE — Progress Notes (Signed)
CRT-D device check in office. Thresholds and sensing consistent with previous device measurements. Lead impedance trends stable over time. 132 mode switch episodes recorded, 1%, + coumadin. No ventricular arrhythmia episodes recorded. Patient bi-ventricularly pacing >85% of the time. Device programmed with appropriate safety margins. Heart failure diagnostics reviewed.  CorVue abnormal in November. Audible/vibratory alerts demonstrated for patient. No changes made this session. Estimated longevity 5.1 years.   Patient education completed including shock plan.  ROV 3 months with Dr.Taylor in [place].

## 2013-03-23 ENCOUNTER — Encounter: Payer: Self-pay | Admitting: Internal Medicine

## 2013-04-07 ENCOUNTER — Ambulatory Visit
Admission: RE | Admit: 2013-04-07 | Discharge: 2013-04-07 | Disposition: A | Discharge status: Home / Self Care | Payer: Commercial Managed Care - HMO | Source: Ambulatory Visit | Attending: Internal Medicine | Admitting: Internal Medicine

## 2013-04-07 ENCOUNTER — Other Ambulatory Visit: Payer: Self-pay | Admitting: Internal Medicine

## 2013-04-07 ENCOUNTER — Encounter: Payer: Self-pay | Admitting: Interventional Cardiology

## 2013-04-07 DIAGNOSIS — M25449 Effusion, unspecified hand: Secondary | ICD-10-CM

## 2013-04-19 ENCOUNTER — Encounter (HOSPITAL_COMMUNITY): Payer: Self-pay | Admitting: Emergency Medicine

## 2013-04-19 ENCOUNTER — Emergency Department (HOSPITAL_COMMUNITY)
Admission: EM | Admit: 2013-04-19 | Discharge: 2013-04-19 | Disposition: A | Discharge status: Home / Self Care | Payer: Medicare HMO | Attending: Emergency Medicine | Admitting: Emergency Medicine

## 2013-04-19 DIAGNOSIS — K219 Gastro-esophageal reflux disease without esophagitis: Secondary | ICD-10-CM | POA: Insufficient documentation

## 2013-04-19 DIAGNOSIS — Z7901 Long term (current) use of anticoagulants: Secondary | ICD-10-CM | POA: Insufficient documentation

## 2013-04-19 DIAGNOSIS — F911 Conduct disorder, childhood-onset type: Secondary | ICD-10-CM | POA: Insufficient documentation

## 2013-04-19 DIAGNOSIS — M109 Gout, unspecified: Secondary | ICD-10-CM | POA: Insufficient documentation

## 2013-04-19 DIAGNOSIS — Z86718 Personal history of other venous thrombosis and embolism: Secondary | ICD-10-CM | POA: Insufficient documentation

## 2013-04-19 DIAGNOSIS — Z87891 Personal history of nicotine dependence: Secondary | ICD-10-CM | POA: Insufficient documentation

## 2013-04-19 DIAGNOSIS — Z87442 Personal history of urinary calculi: Secondary | ICD-10-CM | POA: Insufficient documentation

## 2013-04-19 DIAGNOSIS — M1990 Unspecified osteoarthritis, unspecified site: Secondary | ICD-10-CM | POA: Insufficient documentation

## 2013-04-19 DIAGNOSIS — I2510 Atherosclerotic heart disease of native coronary artery without angina pectoris: Secondary | ICD-10-CM | POA: Insufficient documentation

## 2013-04-19 DIAGNOSIS — E119 Type 2 diabetes mellitus without complications: Secondary | ICD-10-CM | POA: Insufficient documentation

## 2013-04-19 DIAGNOSIS — L723 Sebaceous cyst: Secondary | ICD-10-CM | POA: Insufficient documentation

## 2013-04-19 DIAGNOSIS — I4891 Unspecified atrial fibrillation: Secondary | ICD-10-CM | POA: Insufficient documentation

## 2013-04-19 DIAGNOSIS — Z87448 Personal history of other diseases of urinary system: Secondary | ICD-10-CM | POA: Insufficient documentation

## 2013-04-19 DIAGNOSIS — Z79899 Other long term (current) drug therapy: Secondary | ICD-10-CM | POA: Insufficient documentation

## 2013-04-19 DIAGNOSIS — I10 Essential (primary) hypertension: Secondary | ICD-10-CM | POA: Insufficient documentation

## 2013-04-19 DIAGNOSIS — I509 Heart failure, unspecified: Secondary | ICD-10-CM | POA: Insufficient documentation

## 2013-04-19 DIAGNOSIS — Z9889 Other specified postprocedural states: Secondary | ICD-10-CM | POA: Insufficient documentation

## 2013-04-19 MED ORDER — HYDROCODONE-ACETAMINOPHEN 5-325 MG PO TABS: 1.0000 | ORAL_TABLET | ORAL | Status: DC | PRN

## 2013-04-19 NOTE — ED Provider Notes (Signed)
CSN: [id number]     Arrival date & time 04/19/13  1113 History  This chart was scribed for non-physician practitioner, [name], PA-C working with [name], MD by [name], ED scribe. This patient was seen in room TR10C/TR10C and the patient's care was started at 12:25 PM.   Chief Complaint  Patient presents with  . Abscess   HPI Comments: Troy [name] is a 73 y.o. male who presents to the Emergency Department complaining of an pain and swelling to his upper back that he noticed about one week ago. He states he has some pain with pressure to the area. Pt has a history of cyst in the same area but has never followed up with a dermatologist or surgeon for further treatment.    The history is provided by the patient. No language interpreter was used.    Past Medical History  Diagnosis Date  . CAD (coronary artery disease) 1996    status post PCI of the RCA   . Ischemic dilated cardiomyopathy   . HTN (hypertension)   . GERD (gastroesophageal reflux disease)   . Personal history of DVT (deep vein thrombosis)   . Diabetes mellitus, type 2   . DJD (degenerative joint disease)   . Gout   . Prostatitis   . Nephrolithiasis   . Atrial fibrillation   . Congestive heart failure, unspecified   . Other primary cardiomyopathies    Past Surgical History  Procedure Laterality Date  . Back surgery     Family History  Problem Relation Age of Onset  . Cancer Father     colon  . Stroke Paternal Uncle    History  Substance Use Topics  . Smoking status: Former Smoker    Quit date: 01/22/1980  . Smokeless tobacco: Never Used  . Alcohol Use: No     Comment: denies    Review of Systems  Constitutional: Negative for fever and chills.  Skin:       Abscess.  All other systems reviewed and are negative.   Allergies  Benadryl  Home Medications   Current Outpatient Rx  Name  Route  Sig  Dispense  Refill  . alfuzosin (UROXATRAL) 10 MG 24 hr tablet   Oral   Take 10  mg by mouth daily.         Marland Kitchen allopurinol (ZYLOPRIM) 100 MG tablet   Oral   Take 100 mg by mouth daily.         Marland Kitchen aminophylline 200 MG tablet   Oral   Take 200 mg by mouth daily.         . ATORVASTATIN CALCIUM PO   Oral   Take 1 tablet by mouth at bedtime.         . carvedilol (COREG) 12.5 MG tablet   Oral   Take 12.5 mg by mouth 2 (two) times daily with a meal.         . digoxin (LANOXIN) 0.125 MG tablet   Oral   Take 125 mcg by mouth daily.         Marland Kitchen loratadine (CLARITIN) 10 MG tablet   Oral   Take 10 mg by mouth daily as needed. For allergies         . losartan (COZAAR) 50 MG tablet   Oral   Take 50 mg by mouth daily.         Marland Kitchen torsemide (DEMADEX) 20 MG tablet   Oral   Take 40 mg  by mouth daily.          . traMADol (ULTRAM) 50 MG tablet               . warfarin (COUMADIN) 1 MG tablet   Oral   Take 2-3 mg by mouth daily. Patient takes 3 tablets (3mg ) on Monday,Wednesday,Friday and saturdays, then 2 tablets(2mg ) on Tuesday,Thursday,Sunday.          BP 141/89  Pulse 63  Temp(Src) 97.5 F (36.4 C) (Oral)  Resp 16  Ht 5\' 6"  (1.676 m)  Wt 184 lb (83.462 kg)  BMI 29.71 kg/m2  SpO2 99%  Physical Exam  Nursing note and vitals reviewed. Constitutional: He is oriented to person, place, and time. He appears well-developed and well-nourished. No distress.  HENT:  Head: Normocephalic and atraumatic.  Eyes: EOM are normal.  Neck: Neck supple.  Pulmonary/Chest: Effort normal. No respiratory distress.  Musculoskeletal: Normal range of motion.  Right upper back with 5 x 5 cm area of induration. No overlying erythema. No drainage.No fluctuance noted.  Neurological: He is alert and oriented to person, place, and time.  Skin: Skin is warm and dry. No rash noted. No erythema.  Psychiatric: His speech is normal and behavior is normal. His affect is angry.    ED Course  Fine needle aspiration Date/Time: 04/19/2013 1:02 PM Performed by: [name] Authorized by: [name] Consent: Verbal consent obtained. Risks and benefits: risks, benefits and alternatives were discussed Consent given by: patient Patient understanding: patient states understanding of the procedure being performed Patient consent: the patient's understanding of the procedure matches consent given Procedure consent: procedure consent matches procedure scheduled Imaging studies: imaging studies available Required items: required blood products, implants, devices, and special equipment available Patient identity confirmed: verbally with patient Time out: Immediately prior to procedure a "time out" was called to verify the correct patient, procedure, equipment, support staff and site/side marked as required. Preparation: Patient was prepped and draped in the usual sterile fashion. Local anesthesia used: yes Anesthesia: local infiltration Local anesthetic: lidocaine 2% with epinephrine Anesthetic total: 2 ml Patient sedated: no Patient tolerance: Patient tolerated the procedure well with no immediate complications. Comments: Moderate amount of malodorous purulent discharge.   (including critical care time)  COORDINATION OF CARE: 12:27 PM-Discussed treatment plan which includes speaking with Dr. Regenia Skeeter with pt at bedside and pt agreed to plan.   Labs Review Labs Reviewed  CULTURE, ROUTINE-ABSCESS   Imaging Review No results found.   EKG Interpretation None      MDM   Final diagnoses:  Sebaceous cyst   Pt with multiple cysts on his back and one on neck.  Will aspreate for wound culture and have the patient follow up with a general surgeon and/or dermatologist for further treatment. Prior to starting the aspiration the patient began to get angry at his wait and stated "you will do something" discussed if there was a misunderstanding and restated that I would aspirate the cyst today and that this was a temporary relief of discomfort, buf  for full treatment he needed to be evaluated by a dermatologist or a general surgeon.  At that time the patient stated "you, like every other doctor, just went to school to take people's money".  Once again, I apologized for the wait and the possible misunderstanding and that I would give him a prescription for pain medication as previous stated. Aspiration performed without complaints and the patient was less angry and more polite after  the procedure. Discussed treatment plan with the patient. Return precautions given. Reports understanding and no other concerns at this time.  Patient is stable for discharge at this time.  Meds given in ED:  Medications - No data to display  Discharge Medication List as of 04/19/2013  1:05 PM    START taking these medications   Details  HYDROcodone-acetaminophen (NORCO/VICODIN) 5-325 MG per tablet Take 1 tablet by mouth every 4 (four) hours as needed., Starting 04/19/2013, Until Discontinued, Print         Meds given in ED:  Medications - No data to display  Discharge Medication List as of 04/19/2013  1:05 PM    START taking these medications   Details  HYDROcodone-acetaminophen (NORCO/VICODIN) 5-325 MG per tablet Take 1 tablet by mouth every 4 (four) hours as needed., Starting 04/19/2013, Until Discontinued, Print         I personally performed the services described in this documentation, which was scribed in my presence. The recorded information has been reviewed and is accurate.  [name], PA-C 04/21/13 1112

## 2013-04-19 NOTE — Discharge Instructions (Signed)
Call for a follow up appointment with a Family or Primary Care Provider.  Call [facility] for further evaluation of your sebaceous cysts. Call for further evaluation to Dr. Cristal Generous office for possible surgery. Return if Symptoms worsen.   Take medication as prescribed.

## 2013-04-19 NOTE — ED Notes (Signed)
Pt states he felt a boil or something come up on his back.  Pt is not able to visualize it.  Pt states he's had hx of the same.  Pt states it was hurting and woke him this morning.

## 2013-04-21 ENCOUNTER — Encounter (INDEPENDENT_AMBULATORY_CARE_PROVIDER_SITE_OTHER): Payer: Self-pay

## 2013-04-21 ENCOUNTER — Encounter (INDEPENDENT_AMBULATORY_CARE_PROVIDER_SITE_OTHER): Payer: Self-pay | Admitting: General Surgery

## 2013-04-21 ENCOUNTER — Telehealth (INDEPENDENT_AMBULATORY_CARE_PROVIDER_SITE_OTHER): Payer: Self-pay

## 2013-04-21 ENCOUNTER — Ambulatory Visit (INDEPENDENT_AMBULATORY_CARE_PROVIDER_SITE_OTHER): Payer: Commercial Managed Care - HMO | Admitting: General Surgery

## 2013-04-21 VITALS — BP 124/80 | HR 75 | Temp 97.6°F | Resp 16 | Ht 65.0 in | Wt 187.2 lb

## 2013-04-21 DIAGNOSIS — L723 Sebaceous cyst: Secondary | ICD-10-CM

## 2013-04-21 DIAGNOSIS — L720 Epidermal cyst: Secondary | ICD-10-CM

## 2013-04-21 MED ORDER — CLINDAMYCIN HCL 150 MG PO CAPS: 150.0000 mg | ORAL_CAPSULE | Freq: Four times a day (QID) | ORAL | Status: AC

## 2013-04-21 MED ORDER — SULFAMETHOXAZOLE-TRIMETHOPRIM 400-80 MG PO TABS: 1.0000 | ORAL_TABLET | Freq: Two times a day (BID) | ORAL | Status: DC

## 2013-04-21 NOTE — ED Provider Notes (Signed)
Medical screening examination/treatment/procedure(s) were performed by non-physician practitioner and as supervising physician I was immediately available for consultation/collaboration.   EKG Interpretation None       Richarda Blade, MD 04/21/13 1840

## 2013-04-21 NOTE — Progress Notes (Signed)
Subjective:     Patient ID: Troy [name], male   DOB: 12-Jan-1941, 73 y.o.   MRN: [id number]  HPI Patient is a 73 year old male who presents today with a chief complaint of a mid back cyst. The patient has a complex medical history to include atrial fibrillation on Coumadin, also has a defibrillator, chronic systolic CHF, and coronary artery disease.  The patient states that over the last several days his systems and back his become more bothersome. He's had no drainage from the area.He's had no fevers at home.  Review of Systems  Constitutional: Negative.   HENT: Negative.   Eyes: Negative.   Respiratory: Negative.   Cardiovascular: Negative.   Gastrointestinal: Negative.   Endocrine: Negative.   Neurological: Negative.        Objective:   Physical Exam  Constitutional: He is oriented to person, place, and time. He appears well-developed and well-nourished.  HENT:  Head: Normocephalic and atraumatic.  Eyes: Conjunctivae and EOM are normal. Pupils are equal, round, and reactive to light.  Neck: Normal range of motion. Neck supple.  Cardiovascular: Normal rate, regular rhythm and normal heart sounds.   Pulmonary/Chest: Effort normal and breath sounds normal.  Abdominal: Soft. Bowel sounds are normal. He exhibits no distension and no mass. There is no tenderness. There is no rebound and no guarding.  Musculoskeletal: Normal range of motion.  Neurological: He is alert and oriented to person, place, and time.  Skin: Skin is warm and dry.          Assessment:     73 year old male with likely infected epidermal inclusion cyst.     Plan:     1. The patient will need cardiac clearance and to be off his Coumadin prior to excision of his epidermal inclusion cyst. 2. The patient sees Dr. Rayburn Go as his cardiologist and manage his Coumadin. Once we had cleared we will then schedule his cyst removed in the operating room. 3. We discussed risks and benefits of procedure to include  but not limited to: Infection, bleeding, damage to surrounding structures, possible recurrence. Patient voiced understanding and wished to proceed.

## 2013-04-21 NOTE — Telephone Encounter (Signed)
Called their office to report that the pt stopped his Coumadin 3 days a go on his on b/c he knew he was going to be seen by a doctor for his back abscess. I just wanted them to advise the pt about restarting his Coumadin today b/c we are going to have to get the pt scheduled for surgery as a outpt at the hospital but we need cardiac clearance first. Per Kennyth Lose after speaking with one of their doctor's in their office the pt can restart his Coumadin today and follow protocol. Kennyth Lose will also call the pt today to give him the same instructions. I notified the pt as well to restart the Coumadin. I advised pt that once we get cardiac clearance on him and get instructions about the pt holding the Coumadin we would be calling him to schedule the surgery. The pt understands.

## 2013-04-22 LAB — CULTURE, ROUTINE-ABSCESS: CULTURE: NO GROWTH

## 2013-04-29 ENCOUNTER — Telehealth: Payer: Self-pay | Admitting: Internal Medicine

## 2013-04-29 NOTE — Telephone Encounter (Signed)
Received request from Nurse fax box, documents faxed for surgical clearance. To: [facility]  Fax number: [phone] Attention: [phone]/kdm

## 2013-05-04 ENCOUNTER — Emergency Department (HOSPITAL_COMMUNITY): Payer: Medicare HMO

## 2013-05-04 ENCOUNTER — Emergency Department (HOSPITAL_COMMUNITY)
Admission: EM | Admit: 2013-05-04 | Discharge: 2013-05-04 | Disposition: A | Discharge status: Home / Self Care | Payer: Medicare HMO | Attending: Emergency Medicine | Admitting: Emergency Medicine

## 2013-05-04 ENCOUNTER — Encounter (HOSPITAL_COMMUNITY): Payer: Self-pay | Admitting: Emergency Medicine

## 2013-05-04 DIAGNOSIS — I10 Essential (primary) hypertension: Secondary | ICD-10-CM | POA: Diagnosis not present

## 2013-05-04 DIAGNOSIS — J3489 Other specified disorders of nose and nasal sinuses: Secondary | ICD-10-CM | POA: Diagnosis present

## 2013-05-04 DIAGNOSIS — IMO0002 Reserved for concepts with insufficient information to code with codable children: Secondary | ICD-10-CM | POA: Insufficient documentation

## 2013-05-04 DIAGNOSIS — E119 Type 2 diabetes mellitus without complications: Secondary | ICD-10-CM | POA: Diagnosis not present

## 2013-05-04 DIAGNOSIS — Z87448 Personal history of other diseases of urinary system: Secondary | ICD-10-CM | POA: Insufficient documentation

## 2013-05-04 DIAGNOSIS — Z87442 Personal history of urinary calculi: Secondary | ICD-10-CM | POA: Insufficient documentation

## 2013-05-04 DIAGNOSIS — Z87891 Personal history of nicotine dependence: Secondary | ICD-10-CM | POA: Insufficient documentation

## 2013-05-04 DIAGNOSIS — Z8639 Personal history of other endocrine, nutritional and metabolic disease: Secondary | ICD-10-CM | POA: Insufficient documentation

## 2013-05-04 DIAGNOSIS — Z862 Personal history of diseases of the blood and blood-forming organs and certain disorders involving the immune mechanism: Secondary | ICD-10-CM | POA: Diagnosis not present

## 2013-05-04 DIAGNOSIS — Z8679 Personal history of other diseases of the circulatory system: Secondary | ICD-10-CM | POA: Diagnosis not present

## 2013-05-04 DIAGNOSIS — Z86718 Personal history of other venous thrombosis and embolism: Secondary | ICD-10-CM | POA: Insufficient documentation

## 2013-05-04 DIAGNOSIS — I509 Heart failure, unspecified: Secondary | ICD-10-CM | POA: Insufficient documentation

## 2013-05-04 DIAGNOSIS — Z79899 Other long term (current) drug therapy: Secondary | ICD-10-CM | POA: Diagnosis not present

## 2013-05-04 DIAGNOSIS — Z8719 Personal history of other diseases of the digestive system: Secondary | ICD-10-CM | POA: Diagnosis not present

## 2013-05-04 DIAGNOSIS — I4891 Unspecified atrial fibrillation: Secondary | ICD-10-CM | POA: Diagnosis not present

## 2013-05-04 DIAGNOSIS — Z7901 Long term (current) use of anticoagulants: Secondary | ICD-10-CM | POA: Diagnosis not present

## 2013-05-04 DIAGNOSIS — I2510 Atherosclerotic heart disease of native coronary artery without angina pectoris: Secondary | ICD-10-CM | POA: Diagnosis not present

## 2013-05-04 DIAGNOSIS — Z9861 Coronary angioplasty status: Secondary | ICD-10-CM | POA: Diagnosis not present

## 2013-05-04 DIAGNOSIS — J441 Chronic obstructive pulmonary disease with (acute) exacerbation: Secondary | ICD-10-CM | POA: Diagnosis not present

## 2013-05-04 MED ORDER — PREDNISONE 10 MG PO TABS
60.0000 mg | ORAL_TABLET | Freq: Once | ORAL | Status: AC
  Administered 2013-05-04: 60 mg via ORAL
  Filled 2013-05-04 (×2): qty 1

## 2013-05-04 MED ORDER — IPRATROPIUM-ALBUTEROL 0.5-2.5 (3) MG/3ML IN SOLN
3.0000 mL | Freq: Once | RESPIRATORY_TRACT | Status: AC
  Administered 2013-05-04: 3 mL via RESPIRATORY_TRACT
  Filled 2013-05-04: qty 3

## 2013-05-04 MED ORDER — ALBUTEROL SULFATE (2.5 MG/3ML) 0.083% IN NEBU
2.5000 mg | INHALATION_SOLUTION | Freq: Once | RESPIRATORY_TRACT | Status: AC
  Administered 2013-05-04: 2.5 mg via RESPIRATORY_TRACT
  Filled 2013-05-04: qty 3

## 2013-05-04 MED ORDER — PREDNISONE 20 MG PO TABS: ORAL_TABLET | ORAL | Status: DC

## 2013-05-04 MED ORDER — ALBUTEROL SULFATE HFA 108 (90 BASE) MCG/ACT IN AERS
2.0000 | INHALATION_SPRAY | Freq: Once | RESPIRATORY_TRACT | Status: AC
  Administered 2013-05-04: 2 via RESPIRATORY_TRACT
  Filled 2013-05-04: qty 6.7

## 2013-05-04 NOTE — Discharge Instructions (Signed)
Chronic Obstructive Pulmonary Disease Exacerbation  Chronic obstructive pulmonary disease (COPD) is a common lung problem. In COPD, the flow of air from the lungs is limited. COPD exacerbations are times that breathing gets worse and you need extra treatment. Without treatment they can be life threatening. If they happen often, your lungs can become more damaged. HOME CARE  Do not smoke.  Avoid tobacco smoke and other things that bother your lungs.  If given, take your antibiotic medicine as told. Finish the medicine even if you start to feel better.  Only take medicines as told by your doctor.  Drink enough fluids to keep your pee (urine) clear or pale yellow (unless your doctor has told you not to).  Use a cool mist machine (vaporizer).  If you use oxygen or a machine that turns liquid medicine into a mist (nebulizer), continue to use them as told.  Keep up with shots (vaccinations) as told by your doctor.  Exercise regularly.  Eat healthy foods.  Keep all doctor visits as told. GET HELP RIGHT AWAY IF:  You are very short of breath and it gets worse.  You have trouble talking.  You have bad chest pain.  You have blood in your spit (sputum).  You have a fever.  You keep throwing up (vomiting).  You feel weak, or you pass out (faint).  You feel confused.  You keep getting worse. MAKE SURE YOU:   Understand these instructions.  Will watch your condition.  Will get help right away if you are not doing well or get worse. Document Released: 12/27/2010 Document Revised: 10/28/2012 Document Reviewed: 09/11/2012 [facility] Patient Information 2014 [place].   Start your prednisone prescription tomorrow. Also use the albuterol inhaler during the day every 3-4 hours as needed. Followup your Dr.

## 2013-05-04 NOTE — ED Provider Notes (Signed)
CSN: [id number]     Arrival date & time 05/04/13  1113 History   First MD Initiated Contact with Patient 05/04/13 1253     Chief Complaint  Patient presents with  . Nasal Congestion  . Cough     (Consider location/radiation/quality/duration/timing/severity/associated sxs/prior Treatment) HPI.... cough and wheezing for one week. Patient has known COPD. He has quit smoking. No fever, chills, rusty sputum. Severity is mild to moderate. Nothing makes symptoms better or worse.  Past Medical History  Diagnosis Date  . CAD (coronary artery disease) 1996    status post PCI of the RCA   . Ischemic dilated cardiomyopathy   . HTN (hypertension)   . GERD (gastroesophageal reflux disease)   . Personal history of DVT (deep vein thrombosis)   . Diabetes mellitus, type 2   . DJD (degenerative joint disease)   . Gout   . Prostatitis   . Nephrolithiasis   . Atrial fibrillation   . Congestive heart failure, unspecified   . Other primary cardiomyopathies    Past Surgical History  Procedure Laterality Date  . Back surgery     Family History  Problem Relation Age of Onset  . Cancer Father     colon  . Stroke Paternal Uncle    History  Substance Use Topics  . Smoking status: Former Smoker -- 3.00 packs/day for 50 years    Types: Cigarettes    Quit date: 01/22/1980  . Smokeless tobacco: Never Used  . Alcohol Use: No     Comment: denies    Review of Systems  All other systems reviewed and are negative.     Allergies  Benadryl  Home Medications   Prior to Admission medications   Medication Sig Start Date End Date Taking? Authorizing Provider  ADVAIR DISKUS 250-50 MCG/DOSE AEPB Inhale 1 puff into the lungs 2 (two) times daily. 04/01/13  Yes Historical Provider, MD  alfuzosin (UROXATRAL) 10 MG 24 hr tablet Take 10 mg by mouth daily.   Yes Historical Provider, MD  allopurinol (ZYLOPRIM) 100 MG tablet Take 100 mg by mouth daily.   Yes Historical Provider, MD  carvedilol (COREG)  12.5 MG tablet Take 12.5 mg by mouth 2 (two) times daily with a meal.   Yes Historical Provider, MD  digoxin (LANOXIN) 0.125 MG tablet Take 125 mcg by mouth daily.   Yes Historical Provider, MD  fluticasone (FLONASE) 50 MCG/ACT nasal spray Place 1 spray into both nostrils 2 (two) times daily. 04/07/13  Yes Historical Provider, MD  loratadine (CLARITIN) 10 MG tablet Take 10 mg by mouth daily as needed. For allergies   Yes Historical Provider, MD  losartan (COZAAR) 50 MG tablet Take 50 mg by mouth daily.   Yes Historical Provider, MD  sulfamethoxazole-trimethoprim (BACTRIM DS) 800-160 MG per tablet Take 1 tablet by mouth 2 (two) times daily. 04/28/13  Yes Historical Provider, MD  torsemide (DEMADEX) 20 MG tablet Take 40 mg by mouth 2 (two) times daily.    Yes Historical Provider, MD  warfarin (COUMADIN) 1 MG tablet Take 2-3 mg by mouth daily. Takes 2 tablets daily except on Monday, Wednesday and Friday and all other days Patient takes 3 tablets   Yes Historical Provider, MD  ATORVASTATIN CALCIUM PO Take 1 tablet by mouth at bedtime.    Historical Provider, MD  predniSONE (DELTASONE) 20 MG tablet 3 tabs po day one, then 2 po daily x 4 days 05/04/13   Nat Christen, MD   BP 139/64  Pulse 63  Temp(Src) 98.2 F (36.8 C) (Oral)  Resp 22  Ht 5\' 5"  (1.651 m)  Wt 184 lb (83.462 kg)  BMI 30.62 kg/m2  SpO2 95% Physical Exam  Nursing note and vitals reviewed. Constitutional: He is oriented to person, place, and time. He appears well-developed and well-nourished.  HENT:  Head: Normocephalic and atraumatic.  Eyes: Conjunctivae and EOM are normal. Pupils are equal, round, and reactive to light.  Neck: Normal range of motion. Neck supple.  Cardiovascular: Normal rate, regular rhythm and normal heart sounds.   Pulmonary/Chest: Effort normal.  Minimal expiratory wheeze  Abdominal: Soft. Bowel sounds are normal.  Musculoskeletal: Normal range of motion.  Neurological: He is alert and oriented to person, place,  and time.  Skin: Skin is warm and dry.  Psychiatric: He has a normal mood and affect. His behavior is normal.    ED Course  Procedures (including critical care time) Labs Review Labs Reviewed - No data to display  Imaging Review Dg Chest 2 View  05/04/2013   CLINICAL DATA:  Cough, congestion.  EXAM: CHEST  2 VIEW  COMPARISON:  08/23/2010  FINDINGS: There is hyperinflation of the lungs compatible with COPD. Left pacer/ defibrillator remains in place, unchanged. Lungs are clear. No effusions. Heart is normal size. No acute bony abnormality. Degenerative spurring throughout the thoracic spine.  IMPRESSION: COPD.  No active disease.   Electronically Signed   By: Rolm Baptise M.D.   On: 05/04/2013 13:05     EKG Interpretation None      MDM   Final diagnoses:  COPD with acute exacerbation    Patient is in no acute distress. He feels much better after albuterol/Atrovent nebulizer treatment. We'll start prednisone. Also Rx albuterol for breakthrough wheezing.    Nat Christen, MD 05/04/13 [phone]

## 2013-05-04 NOTE — ED Notes (Signed)
Patient c/o nasal congestion with headache and cough x1 week. Reports some shortness of breath but states "That is not unusual for me, I have COPD. Patient states that it is hard to cough any sputum up due to thickness. Congested cough noted. Patient reports being seen by PCP on 4/8 and given antibiotic (unsure of name) with no relief. Denies any chest pain.

## 2013-05-06 ENCOUNTER — Telehealth: Payer: Self-pay | Admitting: Interventional Cardiology

## 2013-05-06 NOTE — Telephone Encounter (Signed)
No further cardiac testing needed before surgery.  He will ned to hold Coumadin. Please coordinate this with the Coumadin clinic.

## 2013-05-10 ENCOUNTER — Telehealth (INDEPENDENT_AMBULATORY_CARE_PROVIDER_SITE_OTHER): Payer: Self-pay

## 2013-05-10 ENCOUNTER — Ambulatory Visit: Payer: Commercial Managed Care - HMO | Admitting: Physician Assistant

## 2013-05-10 NOTE — Telephone Encounter (Signed)
We don't manage his warfarin.  These needs to be forwarded to whoever manages this so they can tell patient when to stop.

## 2013-05-10 NOTE — Telephone Encounter (Signed)
Called and left message for patient to call our office RE:  Patient need's to hold his Coumadin 5 day's prior to surgery  And resume at 3mg  a day 24 hours post op, patient will need to report to Dr. Alma Downs office for PT/INR check in a week per Dr. Tonita Phoenix.

## 2013-05-10 NOTE — Telephone Encounter (Signed)
To Ysidro Evert, please advise.

## 2013-05-10 NOTE — Telephone Encounter (Signed)
Pt.notified

## 2013-05-10 NOTE — Telephone Encounter (Signed)
Faxed to [facility] attn Dr. Rosendo Gros.

## 2013-05-14 ENCOUNTER — Telehealth (INDEPENDENT_AMBULATORY_CARE_PROVIDER_SITE_OTHER): Payer: Self-pay

## 2013-05-14 NOTE — Telephone Encounter (Signed)
Called and left message for patient to call our office to discuss pre-op instructions for Coumadin management.  Patient surgery will not be scheduled until I can speak with patient to go over instructions per patient's Cardiologist.

## 2013-05-18 ENCOUNTER — Ambulatory Visit: Payer: Medicare Other | Admitting: Interventional Cardiology

## 2013-05-24 ENCOUNTER — Ambulatory Visit: Payer: Commercial Managed Care - HMO | Admitting: Physician Assistant

## 2013-05-31 ENCOUNTER — Ambulatory Visit (INDEPENDENT_AMBULATORY_CARE_PROVIDER_SITE_OTHER): Payer: Commercial Managed Care - HMO | Admitting: *Deleted

## 2013-05-31 DIAGNOSIS — I428 Other cardiomyopathies: Secondary | ICD-10-CM

## 2013-05-31 DIAGNOSIS — I509 Heart failure, unspecified: Secondary | ICD-10-CM

## 2013-05-31 DIAGNOSIS — I5022 Chronic systolic (congestive) heart failure: Secondary | ICD-10-CM

## 2013-05-31 LAB — MDC_IDC_ENUM_SESS_TYPE_INCLINIC
Battery Remaining Longevity: 57.6 mo
Brady Statistic RV Percent Paced: 87 %
Date Time Interrogation Session: 20150511134433
HighPow Impedance: 43.0754
Implantable Pulse Generator Serial Number: 7001284
Lead Channel Impedance Value: 400 Ohm
Lead Channel Impedance Value: 462.5 Ohm
Lead Channel Pacing Threshold Amplitude: 0.75 V
Lead Channel Pacing Threshold Pulse Width: 0.5 ms
Lead Channel Pacing Threshold Pulse Width: 0.5 ms
Lead Channel Sensing Intrinsic Amplitude: 12 mV
Lead Channel Sensing Intrinsic Amplitude: 5 mV
Lead Channel Setting Pacing Amplitude: 2 V
Lead Channel Setting Pacing Amplitude: 2.25 V
Lead Channel Setting Pacing Pulse Width: 0.5 ms
Lead Channel Setting Sensing Sensitivity: 0.5 mV
MDC IDC MSMT LEADCHNL LV IMPEDANCE VALUE: 812.5 Ohm
MDC IDC MSMT LEADCHNL LV PACING THRESHOLD AMPLITUDE: 1.25 V
MDC IDC MSMT LEADCHNL RA PACING THRESHOLD AMPLITUDE: 0.75 V
MDC IDC MSMT LEADCHNL RV PACING THRESHOLD PULSEWIDTH: 0.5 ms
MDC IDC PG MODEL: 3265
MDC IDC SET LEADCHNL RA PACING AMPLITUDE: 1.75 V
MDC IDC SET LEADCHNL RV PACING PULSEWIDTH: 0.5 ms
MDC IDC STAT BRADY RA PERCENT PACED: 55 %
Zone Setting Detection Interval: 330 ms

## 2013-05-31 NOTE — Progress Notes (Signed)
CRT-D device check in office. Thresholds and sensing consistent with previous device measurements. Lead impedance trends stable over time. 1.8%  mode switch episodes recorded, + coumadin. 1 NSVT episode.   Patient bi-ventricularly pacing >87% % of the time. Device programmed with appropriate safety margins. Heart failure diagnostics reviewed.  CorVue abnormal over the last 12 days and patient remains asymptomatic.  Audible/vibratory alerts demonstrated for patient. No changes made this session. Estimated longevity 4.8 years.  Patient education completed including shock plan.  ROV in August with Dr. Lovena Le in [place].

## 2013-06-08 ENCOUNTER — Encounter: Payer: Self-pay | Admitting: Internal Medicine

## 2013-06-11 ENCOUNTER — Ambulatory Visit (INDEPENDENT_AMBULATORY_CARE_PROVIDER_SITE_OTHER): Payer: Commercial Managed Care - HMO | Admitting: Interventional Cardiology

## 2013-06-11 ENCOUNTER — Encounter: Payer: Self-pay | Admitting: Interventional Cardiology

## 2013-06-11 ENCOUNTER — Encounter (INDEPENDENT_AMBULATORY_CARE_PROVIDER_SITE_OTHER): Payer: Self-pay

## 2013-06-11 VITALS — BP 140/60 | HR 71 | Ht 65.0 in | Wt 179.0 lb

## 2013-06-11 DIAGNOSIS — I4891 Unspecified atrial fibrillation: Secondary | ICD-10-CM

## 2013-06-11 DIAGNOSIS — I5022 Chronic systolic (congestive) heart failure: Secondary | ICD-10-CM

## 2013-06-11 DIAGNOSIS — I428 Other cardiomyopathies: Secondary | ICD-10-CM

## 2013-06-11 DIAGNOSIS — I2510 Atherosclerotic heart disease of native coronary artery without angina pectoris: Secondary | ICD-10-CM

## 2013-06-11 DIAGNOSIS — I509 Heart failure, unspecified: Secondary | ICD-10-CM

## 2013-06-11 NOTE — Progress Notes (Signed)
Patient ID: Troy [name], male   DOB: 02/22/1940, 73 y.o.   MRN: [id number]    [place], [address] [place], [place]  [address] Phone: [phone] Fax:  [phone]  Date:  06/11/2013   ID:  Troy [name], DOB 1940-06-26, MRN [id number]  PCP:  Cloyd Stagers, MD      History of Present Illness: Troy [name] is a 73 y.o. male who has a low LVEF. He walks daily. He has been very active. No problems since stopping the amiodarone. 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 No bleeding problems. Cardiomyopathy:  c/o Dizziness while getting up from sitting position, lasting for seconds.  Denies : Chest pain.  Shortness of breath.  Leg edema.  Orthopnea.  Paroxysmal nocturnal dyspnea.  Palpitations.  Syncope.     Wt Readings from Last 3 Encounters:  06/11/13 179 lb (81.194 kg)  05/04/13 184 lb (83.462 kg)  04/21/13 187 lb 3.2 oz (84.913 kg)     Past Medical History  Diagnosis Date  . CAD (coronary artery disease) 1996    status post PCI of the RCA   . Ischemic dilated cardiomyopathy   . HTN (hypertension)   . GERD (gastroesophageal reflux disease)   . Personal history of DVT (deep vein thrombosis)   . Diabetes mellitus, type 2   . DJD (degenerative joint disease)   . Gout   . Prostatitis   . Nephrolithiasis   . Atrial fibrillation   . Congestive heart failure, unspecified   . Other primary cardiomyopathies     Current Outpatient Prescriptions  Medication Sig Dispense Refill  . ADVAIR DISKUS 250-50 MCG/DOSE AEPB Inhale 1 puff into the lungs 2 (two) times daily.      Marland Kitchen alfuzosin (UROXATRAL) 10 MG 24 hr tablet Take 10 mg by mouth daily.      Marland Kitchen allopurinol (ZYLOPRIM) 100 MG tablet Take 100 mg by mouth daily.      Marland Kitchen atorvastatin (LIPITOR) 10 MG tablet Take 10 mg by mouth at bedtime.      . carvedilol (COREG) 12.5 MG tablet Take 12.5 mg by mouth 2 (two) times daily with a meal.      . digoxin (LANOXIN) 0.125 MG tablet Take 125 mcg by  mouth daily.      . fluticasone (FLONASE) 50 MCG/ACT nasal spray Place 1 spray into both nostrils 2 (two) times daily.      Marland Kitchen loratadine (CLARITIN) 10 MG tablet Take 10 mg by mouth daily as needed. For allergies      . losartan (COZAAR) 50 MG tablet Take 50 mg by mouth daily.      Marland Kitchen torsemide (DEMADEX) 20 MG tablet Take 40 mg by mouth 2 (two) times daily.       Marland Kitchen warfarin (COUMADIN) 1 MG tablet Take 2-3 mg by mouth daily. Takes 2 tablets daily except on Monday, Wednesday and Friday and all other days Patient takes 3 tablets       No current facility-administered medications for this visit.    Allergies:    Allergies  Allergen Reactions  . Benadryl [Diphenhydramine Hcl] Nausea And Vomiting    Social History:  The patient  reports that he quit smoking about 33 years ago. His smoking use included Cigarettes. He has a 150 pack-year smoking history. He has never used smokeless tobacco. He reports that he does not drink alcohol or use illicit drugs.   Family History:  The patient's family history includes Cancer  in his father; Stroke in his paternal uncle.   ROS:  Please see the history of present illness.  No nausea, vomiting.  No fevers, chills.  No focal weakness.  No dysuria.    All other systems reviewed and negative.   PHYSICAL EXAM: VS:  BP 140/60  Pulse 71  Ht 5\' 5"  (1.651 m)  Wt 179 lb (81.194 kg)  BMI 29.79 kg/m2 Well nourished, well developed, in no acute distress HEENT: normal Neck: no JVD, no carotid bruits Cardiac:  normal S1, S2; irregularly irregularly Lungs:  clear to auscultation bilaterally, no wheezing, rhonchi or rales Abd: soft, nontender, no hepatomegaly Ext: no edema Skin: warm and dry Neuro:   no focal abnormalities noted  EKG:  Paced in 8/14     ASSESSMENT AND PLAN:  Coronary artery disease  Continue Carvedilol Tablet, 12.5 MG, 1 tablet with food, Orally, Twice a day IMAGING: EKG   Harward,Troy [name] 05/19/2012 11:44:01 AM > Troy [name],Troy [name] 05/19/2012 12:12:23  PM > ventricular paced rhythm, underlying AFib.   Notes: No angina. LDL 95. TG 230. Avoid fatty foods in the diet.  2. Atrial fibrillation  Notes: COumadin for stroke prevention.  No further GI bleeding fortunately.  3. Cardiomyopathy  Continue Losartan Potassium Tablet, 50 MG, 1 tablet, Orally, Once a day Continue Carvedilol Tablet, 12.5 MG, 1 tablet with food, Orally, Twice a day Continue Digoxin Tablet, 0.125 MG, 1 tablet, Orally, Once a day Notes: No signs of CHF. BP slightly increased today. Check BP at drugstore at home and if readings are >140/90, let us know. WOuld increase carvedilol. Pacer/AICD ([employer]) in place.  4. Combined hyperlipidemia  Continue Atorvastatin Calcium Tablet, 10 MG, 1 tablet, Orally, Once a day, 30 day(s), 30 Notes: Last LDL was 95 in June 2014. tolerating well.  LDL 60 in 3/15.   Signed, Mina Marble, MD, [facility] 06/11/2013 4:39 PM

## 2013-06-11 NOTE — Patient Instructions (Signed)
Your physician recommends that you continue on your current medications as directed. Please refer to the Current Medication list given to you today.  Your physician wants you to follow-up in: 1 year with Dr. Varanasi. You will receive a reminder letter in the mail two months in advance. If you don't receive a letter, please call our office to schedule the follow-up appointment.  

## 2013-06-13 ENCOUNTER — Emergency Department (HOSPITAL_COMMUNITY): Payer: Medicare HMO

## 2013-06-13 ENCOUNTER — Encounter (HOSPITAL_COMMUNITY): Payer: Self-pay | Admitting: Emergency Medicine

## 2013-06-13 ENCOUNTER — Emergency Department (HOSPITAL_COMMUNITY)
Admission: EM | Admit: 2013-06-13 | Discharge: 2013-06-13 | Disposition: A | Discharge status: Home / Self Care | Payer: Medicare HMO | Attending: Emergency Medicine | Admitting: Emergency Medicine

## 2013-06-13 DIAGNOSIS — Z87442 Personal history of urinary calculi: Secondary | ICD-10-CM | POA: Insufficient documentation

## 2013-06-13 DIAGNOSIS — Z87891 Personal history of nicotine dependence: Secondary | ICD-10-CM | POA: Insufficient documentation

## 2013-06-13 DIAGNOSIS — M109 Gout, unspecified: Secondary | ICD-10-CM | POA: Insufficient documentation

## 2013-06-13 DIAGNOSIS — I2510 Atherosclerotic heart disease of native coronary artery without angina pectoris: Secondary | ICD-10-CM | POA: Insufficient documentation

## 2013-06-13 DIAGNOSIS — Z86718 Personal history of other venous thrombosis and embolism: Secondary | ICD-10-CM | POA: Insufficient documentation

## 2013-06-13 DIAGNOSIS — K219 Gastro-esophageal reflux disease without esophagitis: Secondary | ICD-10-CM | POA: Insufficient documentation

## 2013-06-13 DIAGNOSIS — Z79899 Other long term (current) drug therapy: Secondary | ICD-10-CM | POA: Insufficient documentation

## 2013-06-13 DIAGNOSIS — Z87448 Personal history of other diseases of urinary system: Secondary | ICD-10-CM | POA: Insufficient documentation

## 2013-06-13 DIAGNOSIS — I4891 Unspecified atrial fibrillation: Secondary | ICD-10-CM | POA: Insufficient documentation

## 2013-06-13 DIAGNOSIS — J441 Chronic obstructive pulmonary disease with (acute) exacerbation: Secondary | ICD-10-CM

## 2013-06-13 DIAGNOSIS — Z7901 Long term (current) use of anticoagulants: Secondary | ICD-10-CM | POA: Insufficient documentation

## 2013-06-13 DIAGNOSIS — Z9861 Coronary angioplasty status: Secondary | ICD-10-CM | POA: Insufficient documentation

## 2013-06-13 DIAGNOSIS — IMO0002 Reserved for concepts with insufficient information to code with codable children: Secondary | ICD-10-CM | POA: Insufficient documentation

## 2013-06-13 DIAGNOSIS — I509 Heart failure, unspecified: Secondary | ICD-10-CM | POA: Insufficient documentation

## 2013-06-13 DIAGNOSIS — E119 Type 2 diabetes mellitus without complications: Secondary | ICD-10-CM | POA: Insufficient documentation

## 2013-06-13 DIAGNOSIS — I10 Essential (primary) hypertension: Secondary | ICD-10-CM | POA: Insufficient documentation

## 2013-06-13 DIAGNOSIS — Z8739 Personal history of other diseases of the musculoskeletal system and connective tissue: Secondary | ICD-10-CM | POA: Insufficient documentation

## 2013-06-13 MED ORDER — IPRATROPIUM BROMIDE 0.02 % IN SOLN
0.5000 mg | Freq: Once | RESPIRATORY_TRACT | Status: AC
  Administered 2013-06-13: 0.5 mg via RESPIRATORY_TRACT
  Filled 2013-06-13: qty 2.5

## 2013-06-13 MED ORDER — ALBUTEROL SULFATE HFA 108 (90 BASE) MCG/ACT IN AERS
2.0000 | INHALATION_SPRAY | RESPIRATORY_TRACT | Status: DC | PRN
  Administered 2013-06-13: 2 via RESPIRATORY_TRACT
  Filled 2013-06-13: qty 6.7

## 2013-06-13 MED ORDER — AEROCHAMBER PLUS W/MASK MISC
1.0000 | Freq: Once | Status: AC
  Administered 2013-06-13: 1

## 2013-06-13 MED ORDER — ALBUTEROL SULFATE (2.5 MG/3ML) 0.083% IN NEBU
5.0000 mg | INHALATION_SOLUTION | Freq: Once | RESPIRATORY_TRACT | Status: AC
  Administered 2013-06-13: 5 mg via RESPIRATORY_TRACT
  Filled 2013-06-13: qty 6

## 2013-06-13 NOTE — ED Provider Notes (Signed)
CSN: [id number]     Arrival date & time 06/13/13  1004 History   This chart was scribed for Wynetta Fines, MD, by Neta Ehlers, ED Scribe. This patient was seen in room APA06/APA06 and the patient's care was started at 10:27 AM.  First MD Initiated Contact with Patient 06/13/13 1026     Chief Complaint  Patient presents with  . Shortness of Breath    The history is provided by the patient. No language interpreter was used.   HPI Comments: Troy [name] is a 73 y.o. male, with a h/o atrial fibrillation and DM,  who presents to the Emergency Department complaining of SOB which has gradually worsened over the past week and which has been associated with a cough, rhinorrhea, and nausea. He has used an inhaler without resolution; he denies using albuterol, though he has in the past. The pt is unsure if he has had a fever; in the ED his temperature is 98.1 F. He denies vomiting, diarrhea, chest pain, or abdominal pain.   Past Medical History  Diagnosis Date  . CAD (coronary artery disease) 1996    status post PCI of the RCA   . Ischemic dilated cardiomyopathy   . HTN (hypertension)   . GERD (gastroesophageal reflux disease)   . Personal history of DVT (deep vein thrombosis)   . Diabetes mellitus, type 2   . DJD (degenerative joint disease)   . Gout   . Prostatitis   . Nephrolithiasis   . Atrial fibrillation   . Congestive heart failure, unspecified   . Other primary cardiomyopathies    Past Surgical History  Procedure Laterality Date  . Back surgery     Family History  Problem Relation Age of Onset  . Cancer Father     colon  . Stroke Paternal Uncle    History  Substance Use Topics  . Smoking status: Former Smoker -- 3.00 packs/day for 50 years    Types: Cigarettes    Quit date: 01/22/1980  . Smokeless tobacco: Never Used  . Alcohol Use: No     Comment: denies    Review of Systems  A complete 10 system review of systems was obtained, and all systems were negative  except where indicated in the HPI and PE.    Allergies  Benadryl  Home Medications   Prior to Admission medications   Medication Sig Start Date End Date Taking? Authorizing Provider  ADVAIR DISKUS 250-50 MCG/DOSE AEPB Inhale 1 puff into the lungs 2 (two) times daily. 04/01/13   Historical Provider, MD  alfuzosin (UROXATRAL) 10 MG 24 hr tablet Take 10 mg by mouth daily.    Historical Provider, MD  allopurinol (ZYLOPRIM) 100 MG tablet Take 100 mg by mouth daily.    Historical Provider, MD  atorvastatin (LIPITOR) 10 MG tablet Take 10 mg by mouth at bedtime.    Historical Provider, MD  carvedilol (COREG) 12.5 MG tablet Take 12.5 mg by mouth 2 (two) times daily with a meal.    Historical Provider, MD  digoxin (LANOXIN) 0.125 MG tablet Take 125 mcg by mouth daily.    Historical Provider, MD  fluticasone (FLONASE) 50 MCG/ACT nasal spray Place 1 spray into both nostrils 2 (two) times daily. 04/07/13   Historical Provider, MD  loratadine (CLARITIN) 10 MG tablet Take 10 mg by mouth daily as needed. For allergies    Historical Provider, MD  losartan (COZAAR) 50 MG tablet Take 50 mg by mouth daily.    Historical Provider,  MD  torsemide (DEMADEX) 20 MG tablet Take 40 mg by mouth 2 (two) times daily.     Historical Provider, MD  warfarin (COUMADIN) 1 MG tablet Take 2-3 mg by mouth daily. Takes 2 tablets daily except on Monday, Wednesday and Friday and all other days Patient takes 3 tablets    Historical Provider, MD   Triage Vitals: BP 148/71  Pulse 78  Temp(Src) 98.1 F (36.7 C) (Oral)  Resp 24  Ht 5\' 5"  (1.651 m)  Wt 179 lb (81.194 kg)  BMI 29.79 kg/m2  SpO2 94%  Physical Exam  General: Well-developed, well-nourished male in no acute distress; appearance consistent with age of record HENT: normocephalic; atraumatic Eyes: pupils equal, round and reactive to light; extraocular muscles intact; right lateral strabismus Neck: supple Heart: regular rate; irregular rhythm, frequent PVCs; no murmurs,  rubs or gallops Lungs: decreased air movement without wheezing; frequent coughs especially when taking deep breaths Abdomen: soft; nondistended; nontender; no masses or hepatosplenomegaly; bowel sounds present Extremities: No deformity; full range of motion; pulses normal; no edema Neurologic: Awake, alert and oriented; motor function intact in all extremities and symmetric; no facial droop Skin: Warm and dry Psychiatric: Normal mood and affect  ED Course  Procedures (including critical care time)  DIAGNOSTIC STUDIES: Oxygen Saturation is 94% on room air, adequate by my interpretation.    COORDINATION OF CARE:  10:34 AM- Discussed treatment plan with patient, and the patient agreed to the plan. The plan includes a chest x-ray and breathing treatment.   MDM  Nursing notes and vitals signs, including pulse oximetry, reviewed.  Summary of this visit's results, reviewed by myself:  Labs:  No results found for this or any previous visit (from the past 24 hour(s)).  Imaging Studies: Dg Chest 2 View  06/13/2013   CLINICAL DATA:  Shortness of breath, cough.  EXAM: CHEST  2 VIEW  COMPARISON:  May 04, 2013.  FINDINGS: Cardiomediastinal silhouette appears normal. Left-sided pacemaker is unchanged in position. Hyperinflation of the lungs is noted consistent with chronic obstructive pulmonary disease. No pneumothorax or significant pleural effusion is noted. Stable interstitial densities are noted in both lung bases most consistent with scarring. No acute pulmonary disease is noted.  IMPRESSION: Findings consistent with chronic obstructive pulmonary disease. No acute cardiopulmonary abnormality seen.   Electronically Signed   By: Sabino Dick M.D.   On: 06/13/2013 11:00   12:05 PM Air movement improved and patient feels better after albuterol and Atrovent neb treatment. He is not on albuterol or other rescue inhaler at home. We will provide him with one.   I personally performed the services  described in this documentation, which was scribed in my presence. The recorded information has been reviewed and is accurate.   Wynetta Fines, MD 06/13/13 1205

## 2013-06-13 NOTE — Discharge Instructions (Signed)
Chronic Obstructive Pulmonary Disease Exacerbation  Chronic obstructive pulmonary disease (COPD) is a common lung problem. In COPD, the flow of air from the lungs is limited. COPD exacerbations are times that breathing gets worse and you need extra treatment. Without treatment they can be life threatening. If they happen often, your lungs can become more damaged. HOME CARE  Do not smoke.  Avoid tobacco smoke and other things that bother your lungs.  If given, take your antibiotic medicine as told. Finish the medicine even if you start to feel better.  Only take medicines as told by your doctor.  Drink enough fluids to keep your pee (urine) clear or pale yellow (unless your doctor has told you not to).  Use a cool mist machine (vaporizer).  If you use oxygen or a machine that turns liquid medicine into a mist (nebulizer), continue to use them as told.  Keep up with shots (vaccinations) as told by your doctor.  Exercise regularly.  Eat healthy foods.  Keep all doctor visits as told. GET HELP RIGHT AWAY IF:  You are very short of breath and it gets worse.  You have trouble talking.  You have bad chest pain.  You have blood in your spit (sputum).  You have a fever.  You keep throwing up (vomiting).  You feel weak, or you pass out (faint).  You feel confused.  You keep getting worse. MAKE SURE YOU:   Understand these instructions.  Will watch your condition.  Will get help right away if you are not doing well or get worse. Document Released: 12/27/2010 Document Revised: 10/28/2012 Document Reviewed: 09/11/2012 [employer] Patient Information 2014 [employer].  

## 2013-06-13 NOTE — ED Notes (Signed)
Pt c/o cough with thick white sputum production, sob that started a week ago, had recently been tx for URI, given antibiotics with improvement in symptoms,

## 2013-06-15 ENCOUNTER — Other Ambulatory Visit: Payer: Self-pay

## 2013-06-15 MED ORDER — ATORVASTATIN CALCIUM 10 MG PO TABS: 10.0000 mg | ORAL_TABLET | Freq: Every day | ORAL | Status: DC

## 2013-06-16 ENCOUNTER — Encounter: Payer: Self-pay | Admitting: Emergency Medicine

## 2013-06-16 ENCOUNTER — Ambulatory Visit (INDEPENDENT_AMBULATORY_CARE_PROVIDER_SITE_OTHER): Payer: Commercial Managed Care - HMO | Admitting: Emergency Medicine

## 2013-06-16 VITALS — BP 162/82 | HR 79 | Temp 97.1°F | Ht 65.0 in | Wt 179.2 lb

## 2013-06-16 DIAGNOSIS — J449 Chronic obstructive pulmonary disease, unspecified: Secondary | ICD-10-CM

## 2013-06-16 MED ORDER — UMECLIDINIUM-VILANTEROL 62.5-25 MCG/INH IN AEPB: 62.5000 ug | INHALATION_SPRAY | Freq: Every day | RESPIRATORY_TRACT | Status: DC

## 2013-06-16 MED ORDER — PREDNISONE 10 MG PO TABS: ORAL_TABLET | ORAL | Status: DC

## 2013-06-16 NOTE — Progress Notes (Signed)
Subjective:    Patient ID: Troy [name], male    DOB: 1940-07-15, 73 y.o.   MRN: [id number]  HPI 73 yo former smoker (100 pk-yrs), hx CAD and dilated CM, A Fib, DM, DVT's, AICD device. Dx w COPD several years ago. Referred by Dr Deforest Hoyles for dyspnea, cough and congestion. For the last month he has thick white mucous that is very difficult to get up. His SOB is worse. He can only walk 25 feet. He occasionally wheezes. He went to ED last Sunday, received nebs. He is on loratadine, fluticasone. Takes Advair.    Review of Systems  Constitutional: Negative for fever and unexpected weight change.  HENT: Negative for congestion, dental problem, ear pain, nosebleeds, postnasal drip, rhinorrhea, sinus pressure, sneezing, sore throat and trouble swallowing.   Eyes: Negative for redness and itching.  Respiratory: Positive for cough and shortness of breath. Negative for chest tightness and wheezing.        Congestion  Cardiovascular: Negative for palpitations and leg swelling.  Gastrointestinal: Negative for nausea and vomiting.  Genitourinary: Negative for dysuria.  Musculoskeletal: Negative for joint swelling.  Skin: Negative for rash.  Neurological: Negative for headaches.  Hematological: Does not bruise/bleed easily.  Psychiatric/Behavioral: Negative for dysphoric mood. The patient is not nervous/anxious.    Past Medical History  Diagnosis Date  . CAD (coronary artery disease) 1996    status post PCI of the RCA   . Ischemic dilated cardiomyopathy   . HTN (hypertension)   . GERD (gastroesophageal reflux disease)   . Personal history of DVT (deep vein thrombosis)   . Diabetes mellitus, type 2   . DJD (degenerative joint disease)   . Gout   . Prostatitis   . Nephrolithiasis   . Atrial fibrillation   . Congestive heart failure, unspecified   . Other primary cardiomyopathies      Family History  Problem Relation Age of Onset  . Cancer Father     colon  . Stroke Paternal Uncle        History   Social History  . Marital Status: Single    Spouse Name: N/A    Number of Children: N/A  . Years of Education: N/A   Occupational History  . retired    Social History Main Topics  . Smoking status: Former Smoker -- 4.00 packs/day for 50 years    Types: Cigarettes    Quit date: 01/22/1980  . Smokeless tobacco: Never Used  . Alcohol Use: No     Comment: denies  . Drug Use: No  . Sexual Activity: Not on file   Other Topics Concern  . Not on file   Social History Narrative  . No narrative on file     Allergies  Allergen Reactions  . Benadryl [Diphenhydramine Hcl] Nausea And Vomiting     Outpatient Prescriptions Prior to Visit  Medication Sig Dispense Refill  . alfuzosin (UROXATRAL) 10 MG 24 hr tablet Take 10 mg by mouth daily.      Marland Kitchen allopurinol (ZYLOPRIM) 100 MG tablet Take 100 mg by mouth daily.      Marland Kitchen atorvastatin (LIPITOR) 10 MG tablet Take 1 tablet (10 mg total) by mouth at bedtime.  90 tablet  3  . carvedilol (COREG) 12.5 MG tablet Take 12.5 mg by mouth 2 (two) times daily with a meal.      . digoxin (LANOXIN) 0.125 MG tablet Take 125 mcg by mouth daily.      . fluticasone (FLONASE) 50  MCG/ACT nasal spray Place 1 spray into both nostrils 2 (two) times daily.      Marland Kitchen loratadine (CLARITIN) 10 MG tablet Take 10 mg by mouth daily as needed. For allergies      . losartan (COZAAR) 50 MG tablet Take 50 mg by mouth daily.      Marland Kitchen torsemide (DEMADEX) 20 MG tablet Take 20 mg by mouth 2 (two) times daily.       Marland Kitchen warfarin (COUMADIN) 1 MG tablet Take 2-3 mg by mouth See admin instructions. Takes 2 tablets on Monday, Friday, Saturday and sunday Takes 3 tablets on Tuesday, Wednesday and thursday      . ADVAIR DISKUS 250-50 MCG/DOSE AEPB Inhale 1 puff into the lungs 2 (two) times daily.       No facility-administered medications prior to visit.         Objective:   Physical Exam Filed Vitals:   06/16/13 1611  BP: 162/82  Pulse: 79  Temp: 97.1 F (36.2 C)   TempSrc: Oral  Height: 5\' 5"  (1.651 m)  Weight: 179 lb 3.2 oz (81.285 kg)  SpO2: 91%  Gen: Pleasant, well-nourished, in no distress,  normal affect  ENT: No lesions,  mouth clear,  oropharynx clear, disconjugate gaze, no postnasal drip  Neck: No JVD, no TMG, no carotid bruits  Lungs: No use of accessory muscles, B soft exp wheezes  Cardiovascular: RRR, heart sounds normal, no murmur or gallops, no peripheral edema  Musculoskeletal: No deformities, no cyanosis or clubbing  Neuro: alert, non focal  Skin: Warm, no lesions or rashes     Assessment & Plan:  COPD (chronic obstructive pulmonary disease) Likely severe, exacerbated recently by allergies. Slow progressive worsening.  - will obtain full PFT - will change Advair to Anoro to see if he benefits - pred taper - start mucinex for secretion clearance.

## 2013-06-16 NOTE — Patient Instructions (Addendum)
Stop Advair and start Anoro one inhalation daily Use ventolin 2 puffs if needed for shortness of breath Walking oximetry today shows that you probably need to wear oxygen with exertion. We will reassess this next time after you have been on your new medication.  Start mucinex 600mg  twice a day Take prednisone taper as directed (until gone) We will perform full pulmonary function testing Follow with Dr Lamonte Sakai in 1 month

## 2013-06-16 NOTE — Assessment & Plan Note (Signed)
Likely severe, exacerbated recently by allergies. Slow progressive worsening.  - will obtain full PFT - will change Advair to Anoro to see if he benefits - pred taper - start mucinex for secretion clearance.

## 2013-07-29 ENCOUNTER — Encounter: Payer: Self-pay | Admitting: Emergency Medicine

## 2013-07-29 ENCOUNTER — Ambulatory Visit (INDEPENDENT_AMBULATORY_CARE_PROVIDER_SITE_OTHER): Payer: Commercial Managed Care - HMO | Admitting: Emergency Medicine

## 2013-07-29 VITALS — BP 140/80 | HR 78 | Ht 65.0 in | Wt 184.0 lb

## 2013-07-29 DIAGNOSIS — J449 Chronic obstructive pulmonary disease, unspecified: Secondary | ICD-10-CM

## 2013-07-29 DIAGNOSIS — J4489 Other specified chronic obstructive pulmonary disease: Secondary | ICD-10-CM

## 2013-07-29 MED ORDER — ALBUTEROL SULFATE HFA 108 (90 BASE) MCG/ACT IN AERS: 2.0000 | INHALATION_SPRAY | RESPIRATORY_TRACT | Status: DC | PRN

## 2013-07-29 NOTE — Assessment & Plan Note (Signed)
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 He has improved significantly on Anoro. Will continue Anoro, repeat his walking oximetry today to see if he still qualifies. If so will start. rov 4

## 2013-07-29 NOTE — Progress Notes (Signed)
Subjective:    Patient ID: Troy [name], male    DOB: 22-Feb-1940, 73 y.o.   MRN: [id number]  HPI 73 yo former smoker (100 pk-yrs), hx CAD and dilated CM, A Fib, DM, DVT's, AICD device. Dx w COPD several years ago. Referred by Dr Deforest Hoyles for dyspnea, cough and congestion. For the last month he has thick white mucous that is very difficult to get up. His SOB is worse. He can only walk 25 feet. He occasionally wheezes. He went to ED last Sunday, received nebs. He is on loratadine, fluticasone. Takes Advair.   ROV 07/29/13 -- follows for COPD, MMP as above. We changed advair to Anoro last visit > he feels much better, breathing is much improved. His cough has almost fully resolved. Also treated with pred taper + mucinex.  He is also on loratadine and fluticasone nasal spray.  We did not start Oxygen last time - I wanted to see if he would improve on BDs.   PFT > very severe AFL with borderline BD response. Decreased DLCO, normal volumes.    Review of Systems  Constitutional: Negative for fever and unexpected weight change.  HENT: Negative for congestion, dental problem, ear pain, nosebleeds, postnasal drip, rhinorrhea, sinus pressure, sneezing, sore throat and trouble swallowing.   Eyes: Negative for redness and itching.  Respiratory: Positive for cough and shortness of breath. Negative for chest tightness and wheezing.        Congestion  Cardiovascular: Negative for palpitations and leg swelling.  Gastrointestinal: Negative for nausea and vomiting.  Genitourinary: Negative for dysuria.  Musculoskeletal: Negative for joint swelling.  Skin: Negative for rash.  Neurological: Negative for headaches.  Hematological: Does not bruise/bleed easily.  Psychiatric/Behavioral: Negative for dysphoric mood. The patient is not nervous/anxious.    Past Medical History  Diagnosis Date  . CAD (coronary artery disease) 1996    status post PCI of the RCA   . Ischemic dilated cardiomyopathy   . HTN  (hypertension)   . GERD (gastroesophageal reflux disease)   . Personal history of DVT (deep vein thrombosis)   . Diabetes mellitus, type 2   . DJD (degenerative joint disease)   . Gout   . Prostatitis   . Nephrolithiasis   . Atrial fibrillation   . Congestive heart failure, unspecified   . Other primary cardiomyopathies      Family History  Problem Relation Age of Onset  . Cancer Father     colon  . Stroke Paternal Uncle      History   Social History  . Marital Status: Single    Spouse Name: N/A    Number of Children: N/A  . Years of Education: N/A   Occupational History  . retired    Social History Main Topics  . Smoking status: Former Smoker -- 4.00 packs/day for 50 years    Types: Cigarettes    Quit date: 01/22/1980  . Smokeless tobacco: Never Used  . Alcohol Use: No     Comment: denies  . Drug Use: No  . Sexual Activity: Not on file   Other Topics Concern  . Not on file   Social History Narrative  . No narrative on file     Allergies  Allergen Reactions  . Benadryl [Diphenhydramine Hcl] Nausea And Vomiting     Outpatient Prescriptions Prior to Visit  Medication Sig Dispense Refill  . alfuzosin (UROXATRAL) 10 MG 24 hr tablet Take 10 mg by mouth daily.      Marland Kitchen  allopurinol (ZYLOPRIM) 100 MG tablet Take 100 mg by mouth daily.      Marland Kitchen atorvastatin (LIPITOR) 10 MG tablet Take 1 tablet (10 mg total) by mouth at bedtime.  90 tablet  3  . carvedilol (COREG) 12.5 MG tablet Take 12.5 mg by mouth 2 (two) times daily with a meal.      . digoxin (LANOXIN) 0.125 MG tablet Take 125 mcg by mouth daily.      . fluticasone (FLONASE) 50 MCG/ACT nasal spray Place 1 spray into both nostrils 2 (two) times daily.      Marland Kitchen loratadine (CLARITIN) 10 MG tablet Take 10 mg by mouth daily as needed. For allergies      . losartan (COZAAR) 50 MG tablet Take 50 mg by mouth daily.      Marland Kitchen torsemide (DEMADEX) 20 MG tablet Take 20 mg by mouth 2 (two) times daily.       Marland Kitchen  Umeclidinium-Vilanterol 62.5-25 MCG/INH AEPB Inhale 62.5 mcg into the lungs daily.  30 each  11  . warfarin (COUMADIN) 1 MG tablet Take 2-3 mg by mouth See admin instructions. Takes 2 tablets on Monday, Friday, Saturday and sunday Takes 3 tablets on Tuesday, Wednesday and thursday      . predniSONE (DELTASONE) 10 MG tablet Take 40mg  daily for 3 days, then 30mg  daily for 3 days, then 20mg  daily for 3 days, then 10mg  daily for 3 days, then stop  30 tablet  0   No facility-administered medications prior to visit.         Objective:   Physical Exam Filed Vitals:   07/29/13 1208  BP: 140/80  Pulse: 78  Height: 5\' 5"  (1.651 m)  Weight: 184 lb (83.462 kg)  SpO2: 96%  Gen: Pleasant, well-nourished, in no distress,  normal affect  ENT: No lesions,  mouth clear,  oropharynx clear, disconjugate gaze, no postnasal drip  Neck: No JVD, no TMG, no carotid bruits  Lungs: No use of accessory muscles, B soft exp wheezes  Cardiovascular: RRR, heart sounds normal, no murmur or gallops, no peripheral edema  Musculoskeletal: No deformities, no cyanosis or clubbing  Neuro: alert, non focal  Skin: Warm, no lesions or rashes     Assessment & Plan:  COPD (chronic obstructive pulmonary disease) 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 He has improved significantly on Anoro. Will continue Anoro, repeat his walking oximetry today to see if he still qualifies. If so will start. rov 4

## 2013-07-29 NOTE — Progress Notes (Signed)
PFT done today. 

## 2013-07-29 NOTE — Patient Instructions (Addendum)
Walking oximetry today showed that your oxygen level did not drop Please continue Anoro once inhalation daily Use albuterol 2 puffs if needed for shortness of breath Follow with Dr Lamonte Sakai in 4 months or sooner if you have any problems.

## 2013-08-11 LAB — PULMONARY FUNCTION TEST
DL/VA % pred: 59 %
DL/VA: 2.52 ml/min/mmHg/L
DLCO unc % pred: 47 %
DLCO unc: 12.07 ml/min/mmHg
FEF 25-75 Post: 0.53 L/s
FEF 25-75 Pre: 0.44 L/s
FEF2575-%Change-Post: 21 %
FEF2575-%Pred-Post: 28 %
FEF2575-%Pred-Pre: 23 %
FEV1-%Change-Post: 10 %
FEV1-%Pred-Post: 48 %
FEV1-%Pred-Pre: 44 %
FEV1-Post: 1.22 L
FEV1-Pre: 1.11 L
FEV1FVC-%Change-Post: 3 %
FEV1FVC-%Pred-Pre: 59 %
FEV6-%Change-Post: 6 %
FEV6-%Pred-Post: 79 %
FEV6-%Pred-Pre: 74 %
FEV6-Post: 2.56 L
FEV6-Pre: 2.41 L
FEV6FVC-%Change-Post: 0 %
FEV6FVC-%Pred-Post: 101 %
FEV6FVC-%Pred-Pre: 101 %
FVC-%Change-Post: 6 %
FVC-%Pred-Post: 78 %
FVC-%Pred-Pre: 73 %
FVC-Post: 2.73 L
FVC-Pre: 2.55 L
Post FEV1/FVC ratio: 45 %
Post FEV6/FVC ratio: 94 %
Pre FEV1/FVC ratio: 43 %
Pre FEV6/FVC Ratio: 94 %

## 2013-09-10 ENCOUNTER — Encounter: Payer: Self-pay | Admitting: Internal Medicine

## 2013-09-10 ENCOUNTER — Ambulatory Visit (INDEPENDENT_AMBULATORY_CARE_PROVIDER_SITE_OTHER): Payer: Commercial Managed Care - HMO | Admitting: Internal Medicine

## 2013-09-10 VITALS — BP 112/58 | HR 79 | Ht 65.0 in | Wt 190.0 lb

## 2013-09-10 DIAGNOSIS — I509 Heart failure, unspecified: Secondary | ICD-10-CM

## 2013-09-10 DIAGNOSIS — I428 Other cardiomyopathies: Secondary | ICD-10-CM

## 2013-09-10 DIAGNOSIS — I480 Paroxysmal atrial fibrillation: Secondary | ICD-10-CM

## 2013-09-10 DIAGNOSIS — I5022 Chronic systolic (congestive) heart failure: Secondary | ICD-10-CM | POA: Diagnosis not present

## 2013-09-10 DIAGNOSIS — I4891 Unspecified atrial fibrillation: Secondary | ICD-10-CM

## 2013-09-10 DIAGNOSIS — Z95810 Presence of automatic (implantable) cardiac defibrillator: Secondary | ICD-10-CM

## 2013-09-10 LAB — MDC_IDC_ENUM_SESS_TYPE_INCLINIC
Battery Remaining Longevity: 56.4 mo
Brady Statistic RA Percent Paced: 52 %
Brady Statistic RV Percent Paced: 83 %
HIGH POWER IMPEDANCE MEASURED VALUE: 46.6071
HIGH POWER IMPEDANCE MEASURED VALUE: 47 Ohm
Lead Channel Impedance Value: 375 Ohm
Lead Channel Impedance Value: 525 Ohm
Lead Channel Pacing Threshold Amplitude: 0.75 V
Lead Channel Pacing Threshold Amplitude: 0.875 V
Lead Channel Pacing Threshold Pulse Width: 0.5 ms
Lead Channel Pacing Threshold Pulse Width: 0.5 ms
Lead Channel Sensing Intrinsic Amplitude: 12 mV
Lead Channel Setting Pacing Amplitude: 2 V
Lead Channel Setting Pacing Amplitude: 2 V
Lead Channel Setting Pacing Pulse Width: 0.5 ms
MDC IDC MSMT LEADCHNL LV IMPEDANCE VALUE: 800 Ohm
MDC IDC MSMT LEADCHNL LV PACING THRESHOLD PULSEWIDTH: 0.5 ms
MDC IDC MSMT LEADCHNL RA PACING THRESHOLD AMPLITUDE: 0.75 V
MDC IDC MSMT LEADCHNL RA SENSING INTR AMPL: 5 mV
MDC IDC PG MODEL: 3265
MDC IDC PG SERIAL: 7001284
MDC IDC SESS DTM: 20150821112100
MDC IDC SET LEADCHNL LV PACING PULSEWIDTH: 0.5 ms
MDC IDC SET LEADCHNL RA PACING AMPLITUDE: 1.75 V
MDC IDC SET LEADCHNL RV SENSING SENSITIVITY: 0.5 mV
MDC IDC SET ZONE DETECTION INTERVAL: 330 ms

## 2013-09-10 NOTE — Progress Notes (Signed)
HPI Troy [name] returns today for followup. He is a pleasant 73 yo man with a mixed CM, chronic class 2 CHF, s/p ICD, and PAF. In the interim her has done well. He has mild peripheral edema due to venous insufficiency. He denies syncope or ICD shock. He does not feel palpitations. Allergies  Allergen Reactions  . Benadryl [Diphenhydramine Hcl] Nausea And Vomiting     Current Outpatient Prescriptions  Medication Sig Dispense Refill  . albuterol (PROVENTIL HFA;VENTOLIN HFA) 108 (90 BASE) MCG/ACT inhaler Inhale 2 puffs into the lungs every 4 (four) hours as needed for wheezing or shortness of breath.  1 Inhaler  11  . alfuzosin (UROXATRAL) 10 MG 24 hr tablet Take 10 mg by mouth daily.      Marland Kitchen allopurinol (ZYLOPRIM) 100 MG tablet Take 100 mg by mouth daily.      Marland Kitchen atorvastatin (LIPITOR) 10 MG tablet Take 1 tablet (10 mg total) by mouth at bedtime.  90 tablet  3  . carvedilol (COREG) 12.5 MG tablet Take 12.5 mg by mouth 2 (two) times daily with a meal.      . digoxin (LANOXIN) 0.125 MG tablet Take 125 mcg by mouth daily.      . fluticasone (FLONASE) 50 MCG/ACT nasal spray Place 1 spray into both nostrils 2 (two) times daily.      Marland Kitchen loratadine (CLARITIN) 10 MG tablet Take 10 mg by mouth daily as needed. For allergies      . losartan (COZAAR) 50 MG tablet Take 50 mg by mouth daily.      Marland Kitchen torsemide (DEMADEX) 20 MG tablet Take 20 mg by mouth 2 (two) times daily.       Marland Kitchen Umeclidinium-Vilanterol 62.5-25 MCG/INH AEPB Inhale 62.5 mcg into the lungs daily.  30 each  11  . warfarin (COUMADIN) 1 MG tablet Take 2-3 mg by mouth See admin instructions. Takes 2 tablets on Monday, Friday, Saturday and sunday Takes 3 tablets on Tuesday, Wednesday and thursday       No current facility-administered medications for this visit.     Past Medical History  Diagnosis Date  . CAD (coronary artery disease) 1996    status post PCI of the RCA   . Ischemic dilated cardiomyopathy   . HTN (hypertension)   . GERD  (gastroesophageal reflux disease)   . Personal history of DVT (deep vein thrombosis)   . Diabetes mellitus, type 2   . DJD (degenerative joint disease)   . Gout   . Prostatitis   . Nephrolithiasis   . Atrial fibrillation   . Congestive heart failure, unspecified   . Other primary cardiomyopathies     ROS:   All systems reviewed and negative except as noted in the HPI.   Past Surgical History  Procedure Laterality Date  . Back surgery    . Coronary stent placement       Family History  Problem Relation Age of Onset  . Cancer Father     colon  . Stroke Paternal Uncle      History   Social History  . Marital Status: Single    Spouse Name: N/A    Number of Children: N/A  . Years of Education: N/A   Occupational History  . retired    Social History Main Topics  . Smoking status: Former Smoker -- 4.00 packs/day for 50 years    Types: Cigarettes    Quit date: 01/22/1980  . Smokeless tobacco: Never Used  . Alcohol Use: No  Comment: denies  . Drug Use: No  . Sexual Activity: Not on file   Other Topics Concern  . Not on file   Social History Narrative  . No narrative on file     BP 112/58  Pulse 79  Ht 5\' 5"  (1.651 m)  Wt 190 lb (86.183 kg)  BMI 31.62 kg/m2  Physical Exam:  Well appearing 73 yo man, NAD HEENT: Unremarkable Neck:  7 cm JVD, no thyromegally Back:  No CVA tenderness Lungs:  Clear with no wheezes, rales, or rhonchi HEART:  Regular rate rhythm, no murmurs, no rubs, no clicks Abd:  soft, positive bowel sounds, no organomegally, no rebound, no guarding Ext:  2 plus pulses, no edema, no cyanosis, no clubbing Skin:  No rashes no nodules Neuro:  CN II through XII intact, motor grossly intact   DEVICE  Normal device function.  See PaceArt for details.   Assess/Plan:

## 2013-09-10 NOTE — Patient Instructions (Signed)
Your physician wants you to follow-up in: 1 year with Dr. Lovena Le and 3 months with Nevin Bloodgood. You will receive a reminder letter in the mail two months in advance. If you don't receive a letter, please call our office to schedule the follow-up appointment.  Your physician recommends that you continue on your current medications as directed. Please refer to the Current Medication list given to you today.  Thank you for choosing [facility]!!

## 2013-09-12 ENCOUNTER — Encounter: Payer: Self-pay | Admitting: Internal Medicine

## 2013-09-12 NOTE — Assessment & Plan Note (Signed)
His symptoms are class 2. No change in medical therapy.

## 2013-09-12 NOTE — Assessment & Plan Note (Signed)
His [employer] Jude BiV ICD is working normally. Will recheck in several months.  

## 2013-09-12 NOTE — Assessment & Plan Note (Signed)
He is maintaining NSR 99% of the time. No change in medical therapy.

## 2013-09-14 ENCOUNTER — Encounter: Payer: Commercial Managed Care - HMO | Admitting: Internal Medicine

## 2013-09-21 ENCOUNTER — Encounter: Payer: Self-pay | Admitting: Internal Medicine

## 2013-11-07 ENCOUNTER — Encounter (HOSPITAL_COMMUNITY): Payer: Self-pay | Admitting: Emergency Medicine

## 2013-11-07 ENCOUNTER — Emergency Department (HOSPITAL_COMMUNITY): Payer: Medicare HMO

## 2013-11-07 ENCOUNTER — Emergency Department (HOSPITAL_COMMUNITY)
Admission: EM | Admit: 2013-11-07 | Discharge: 2013-11-07 | Disposition: A | Discharge status: Home / Self Care | Payer: Medicare HMO | Attending: Emergency Medicine | Admitting: Emergency Medicine

## 2013-11-07 DIAGNOSIS — I872 Venous insufficiency (chronic) (peripheral): Secondary | ICD-10-CM | POA: Insufficient documentation

## 2013-11-07 DIAGNOSIS — R0600 Dyspnea, unspecified: Secondary | ICD-10-CM | POA: Insufficient documentation

## 2013-11-07 DIAGNOSIS — Z9861 Coronary angioplasty status: Secondary | ICD-10-CM | POA: Diagnosis not present

## 2013-11-07 DIAGNOSIS — Z7951 Long term (current) use of inhaled steroids: Secondary | ICD-10-CM | POA: Diagnosis not present

## 2013-11-07 DIAGNOSIS — Z87442 Personal history of urinary calculi: Secondary | ICD-10-CM | POA: Diagnosis not present

## 2013-11-07 DIAGNOSIS — I4891 Unspecified atrial fibrillation: Secondary | ICD-10-CM | POA: Diagnosis not present

## 2013-11-07 DIAGNOSIS — I10 Essential (primary) hypertension: Secondary | ICD-10-CM | POA: Diagnosis not present

## 2013-11-07 DIAGNOSIS — Z95810 Presence of automatic (implantable) cardiac defibrillator: Secondary | ICD-10-CM | POA: Insufficient documentation

## 2013-11-07 DIAGNOSIS — Z79899 Other long term (current) drug therapy: Secondary | ICD-10-CM | POA: Diagnosis not present

## 2013-11-07 DIAGNOSIS — I2510 Atherosclerotic heart disease of native coronary artery without angina pectoris: Secondary | ICD-10-CM | POA: Diagnosis not present

## 2013-11-07 DIAGNOSIS — Z87891 Personal history of nicotine dependence: Secondary | ICD-10-CM | POA: Diagnosis not present

## 2013-11-07 DIAGNOSIS — N418 Other inflammatory diseases of prostate: Secondary | ICD-10-CM | POA: Insufficient documentation

## 2013-11-07 DIAGNOSIS — I509 Heart failure, unspecified: Secondary | ICD-10-CM | POA: Diagnosis not present

## 2013-11-07 DIAGNOSIS — E119 Type 2 diabetes mellitus without complications: Secondary | ICD-10-CM | POA: Insufficient documentation

## 2013-11-07 DIAGNOSIS — Z86718 Personal history of other venous thrombosis and embolism: Secondary | ICD-10-CM | POA: Insufficient documentation

## 2013-11-07 DIAGNOSIS — Z7901 Long term (current) use of anticoagulants: Secondary | ICD-10-CM | POA: Diagnosis not present

## 2013-11-07 DIAGNOSIS — M109 Gout, unspecified: Secondary | ICD-10-CM | POA: Diagnosis not present

## 2013-11-07 DIAGNOSIS — M1990 Unspecified osteoarthritis, unspecified site: Secondary | ICD-10-CM | POA: Diagnosis not present

## 2013-11-07 DIAGNOSIS — R0602 Shortness of breath: Secondary | ICD-10-CM | POA: Diagnosis present

## 2013-11-07 LAB — CBC WITH DIFFERENTIAL/PLATELET
Basophils Absolute: 0 10*3/uL (ref 0.0–0.1)
Basophils Relative: 0 % (ref 0–1)
Eosinophils Absolute: 0.1 10*3/uL (ref 0.0–0.7)
Eosinophils Relative: 1 % (ref 0–5)
HCT: 39.7 % (ref 39.0–52.0)
Hemoglobin: 13.1 g/dL (ref 13.0–17.0)
Lymphocytes Relative: 17 % (ref 12–46)
Lymphs Abs: 1.2 10*3/uL (ref 0.7–4.0)
MCH: 31.6 pg (ref 26.0–34.0)
MCHC: 33 g/dL (ref 30.0–36.0)
MCV: 95.9 fL (ref 78.0–100.0)
Monocytes Absolute: 0.8 10*3/uL (ref 0.1–1.0)
Monocytes Relative: 11 % (ref 3–12)
Neutro Abs: 5.3 10*3/uL (ref 1.7–7.7)
Neutrophils Relative %: 71 % (ref 43–77)
Platelets: 107 10*3/uL — ABNORMAL LOW (ref 150–400)
RBC: 4.14 MIL/uL — ABNORMAL LOW (ref 4.22–5.81)
RDW: 14.4 % (ref 11.5–15.5)
WBC: 7.4 10*3/uL (ref 4.0–10.5)

## 2013-11-07 LAB — COMPREHENSIVE METABOLIC PANEL
ALT: 8 U/L (ref 0–53)
AST: 13 U/L (ref 0–37)
Albumin: 3.4 g/dL — ABNORMAL LOW (ref 3.5–5.2)
Alkaline Phosphatase: 75 U/L (ref 39–117)
Anion gap: 10 (ref 5–15)
BUN: 19 mg/dL (ref 6–23)
CO2: 30 mEq/L (ref 19–32)
Calcium: 8.8 mg/dL (ref 8.4–10.5)
Chloride: 97 mEq/L (ref 96–112)
Creatinine, Ser: 1.54 mg/dL — ABNORMAL HIGH (ref 0.50–1.35)
GFR calc Af Amer: 50 mL/min — ABNORMAL LOW (ref >90)
GFR calc non Af Amer: 43 mL/min — ABNORMAL LOW (ref >90)
Glucose, Bld: 170 mg/dL — ABNORMAL HIGH (ref 70–99)
Potassium: 3.6 mEq/L — ABNORMAL LOW (ref 3.7–5.3)
Sodium: 137 mEq/L (ref 137–147)
Total Bilirubin: 1.1 mg/dL (ref 0.3–1.2)
Total Protein: 7 g/dL (ref 6.0–8.3)

## 2013-11-07 LAB — I-STAT TROPONIN, ED: Troponin i, poc: 0.04 ng/mL (ref 0.00–0.08)

## 2013-11-07 LAB — PRO B NATRIURETIC PEPTIDE: Pro B Natriuretic peptide (BNP): 7451 pg/mL — ABNORMAL HIGH (ref 0–125)

## 2013-11-07 MED ORDER — ALBUTEROL SULFATE 1.25 MG/3ML IN NEBU: 1.0000 | INHALATION_SOLUTION | Freq: Four times a day (QID) | RESPIRATORY_TRACT | Status: DC | PRN

## 2013-11-07 MED ORDER — IPRATROPIUM-ALBUTEROL 0.5-2.5 (3) MG/3ML IN SOLN
3.0000 mL | Freq: Once | RESPIRATORY_TRACT | Status: AC
Start: 2013-11-07 — End: 2013-11-07
  Administered 2013-11-07: 3 mL via RESPIRATORY_TRACT
  Filled 2013-11-07: qty 3

## 2013-11-07 MED ORDER — PREDNISONE 20 MG PO TABS
40.0000 mg | ORAL_TABLET | Freq: Once | ORAL | Status: AC
  Administered 2013-11-07: 40 mg via ORAL
  Filled 2013-11-07: qty 2

## 2013-11-07 MED ORDER — POTASSIUM CHLORIDE CRYS ER 20 MEQ PO TBCR
40.0000 meq | EXTENDED_RELEASE_TABLET | Freq: Once | ORAL | Status: AC
  Administered 2013-11-07: 40 meq via ORAL
  Filled 2013-11-07: qty 2

## 2013-11-07 MED ORDER — PREDNISONE 20 MG PO TABS: 40.0000 mg | ORAL_TABLET | Freq: Every day | ORAL | Status: DC

## 2013-11-07 MED ORDER — FUROSEMIDE 20 MG PO TABS
60.0000 mg | ORAL_TABLET | Freq: Once | ORAL | Status: AC
  Administered 2013-11-07: 60 mg via ORAL
  Filled 2013-11-07: qty 3

## 2013-11-07 NOTE — Discharge Instructions (Signed)
Take 1 extra dose of demadex (torsemide) daily for the next 4 days then resume nromal dosing.   Shortness of Breath Shortness of breath means you have trouble breathing. It could also mean that you have a medical problem. You should get immediate medical care for shortness of breath. CAUSES   Not enough oxygen in the air such as with high altitudes or a smoke-filled room.  Certain lung diseases, infections, or problems.  Heart disease or conditions, such as angina or heart failure.  Low red blood cells (anemia).  Poor physical fitness, which can cause shortness of breath when you exercise.  Chest or back injuries or stiffness.  Being overweight.  Smoking.  Anxiety, which can make you feel like you are not getting enough air. DIAGNOSIS  Serious medical problems can often be found during your physical exam. Tests may also be done to determine why you are having shortness of breath. Tests may include:  Chest X-rays.  Lung function tests.  Blood tests.  An electrocardiogram (ECG).  An ambulatory electrocardiogram. An ambulatory ECG records your heartbeat patterns over a 24-hour period.  Exercise testing.  A transthoracic echocardiogram (TTE). During echocardiography, sound waves are used to evaluate how blood flows through your heart.  A transesophageal echocardiogram (TEE).  Imaging scans. Your health care provider may not be able to find a cause for your shortness of breath after your exam. In this case, it is important to have a follow-up exam with your health care provider as directed.  TREATMENT  Treatment for shortness of breath depends on the cause of your symptoms and can vary greatly. HOME CARE INSTRUCTIONS   Do not smoke. Smoking is a common cause of shortness of breath. If you smoke, ask for help to quit.  Avoid being around chemicals or things that may bother your breathing, such as paint fumes and dust.  Rest as needed. Slowly resume your usual  activities.  If medicines were prescribed, take them as directed for the full length of time directed. This includes oxygen and any inhaled medicines.  Keep all follow-up appointments as directed by your health care provider. SEEK MEDICAL CARE IF:   Your condition does not improve in the time expected.  You have a hard time doing your normal activities even with rest.  You have any new symptoms. SEEK IMMEDIATE MEDICAL CARE IF:   Your shortness of breath gets worse.  You feel light-headed, faint, or develop a cough not controlled with medicines.  You start coughing up blood.  You have pain with breathing.  You have chest pain or pain in your arms, shoulders, or abdomen.  You have a fever.  You are unable to walk up stairs or exercise the way you normally do. MAKE SURE YOU:  Understand these instructions.  Will watch your condition.  Will get help right away if you are not doing well or get worse. Document Released: 10/02/2000 Document Revised: 01/12/2013 Document Reviewed: 03/25/2011 [facility] Patient Information 2015 [place], [place]. This information is not intended to replace advice given to you by your health care provider. Make sure you discuss any questions you have with your health care provider.

## 2013-11-07 NOTE — ED Notes (Signed)
Pt. Stated, I've been having SOB for the last couple of days. During the night I had some tightness in my chest.

## 2013-11-07 NOTE — ED Notes (Signed)
to xray

## 2013-11-07 NOTE — ED Provider Notes (Signed)
CSN: [id number]     Arrival date & time 11/07/13  1045 History   First MD Initiated Contact with Patient 11/07/13 1158     Chief Complaint  Patient presents with  . Shortness of Breath  . Chest Pain    chest tightness     (Consider location/radiation/quality/duration/timing/severity/associated sxs/prior Treatment) HPI  72yM with dyspnea. Has a hx of mixed CM, CHF, s/p ICD, and PAF.  He has mild peripheral edema due to venous insufficiency. He denies syncope or ICD shock. He does not feel palpitations. Dyspnea worsening over past several weeks. No fever or chills. No change in cough. Reports compliance with meds. No CP.    Past Medical History  Diagnosis Date  . CAD (coronary artery disease) 1996    status post PCI of the RCA   . Ischemic dilated cardiomyopathy   . HTN (hypertension)   . GERD (gastroesophageal reflux disease)   . Personal history of DVT (deep vein thrombosis)   . Diabetes mellitus, type 2   . DJD (degenerative joint disease)   . Gout   . Prostatitis   . Nephrolithiasis   . Atrial fibrillation   . Congestive heart failure, unspecified   . Other primary cardiomyopathies    Past Surgical History  Procedure Laterality Date  . Back surgery    . Coronary stent placement     Family History  Problem Relation Age of Onset  . Cancer Father     colon  . Stroke Paternal Uncle    History  Substance Use Topics  . Smoking status: Former Smoker -- 4.00 packs/day for 50 years    Types: Cigarettes    Quit date: 01/22/1980  . Smokeless tobacco: Never Used  . Alcohol Use: No     Comment: denies    Review of Systems  All systems reviewed and negative, other than as noted in HPI.   Allergies  Benadryl  Home Medications   Prior to Admission medications   Medication Sig Start Date End Date Taking? Authorizing Provider  albuterol (PROVENTIL HFA;VENTOLIN HFA) 108 (90 BASE) MCG/ACT inhaler Inhale 2 puffs into the lungs every 4 (four) hours as needed for  wheezing or shortness of breath. 07/29/13  Yes Collene Gobble, MD  alfuzosin (UROXATRAL) 10 MG 24 hr tablet Take 10 mg by mouth daily.   Yes Historical Provider, MD  allopurinol (ZYLOPRIM) 100 MG tablet Take 100 mg by mouth daily.   Yes Historical Provider, MD  atorvastatin (LIPITOR) 10 MG tablet Take 1 tablet (10 mg total) by mouth at bedtime. 06/15/13  Yes Jettie Booze, MD  carvedilol (COREG) 12.5 MG tablet Take 12.5 mg by mouth 2 (two) times daily with a meal.   Yes Historical Provider, MD  digoxin (LANOXIN) 0.125 MG tablet Take 125 mcg by mouth daily.   Yes Historical Provider, MD  fluticasone (FLONASE) 50 MCG/ACT nasal spray Place 1 spray into both nostrils 2 (two) times daily. 04/07/13  Yes Historical Provider, MD  loratadine (CLARITIN) 10 MG tablet Take 10 mg by mouth daily as needed. For allergies   Yes Historical Provider, MD  losartan (COZAAR) 50 MG tablet Take 50 mg by mouth daily.   Yes Historical Provider, MD  torsemide (DEMADEX) 20 MG tablet Take 20 mg by mouth 2 (two) times daily.    Yes Historical Provider, MD  Umeclidinium-Vilanterol 62.5-25 MCG/INH AEPB Inhale 62.5 mcg into the lungs daily. 06/16/13  Yes Collene Gobble, MD  warfarin (COUMADIN) 1 MG tablet Take 2-3 mg  by mouth See admin instructions. Takes 2 tablets on Monday, Tuesday 3 mg on all other days   Yes Historical Provider, MD   BP 134/68  Pulse 56  Temp(Src) 97.8 F (36.6 C) (Oral)  Resp 15  SpO2 99% Physical Exam  Nursing note and vitals reviewed. Constitutional: He appears well-developed and well-nourished. No distress.  HENT:  Head: Normocephalic and atraumatic.  Eyes: Conjunctivae are normal. Right eye exhibits no discharge. Left eye exhibits no discharge.  Neck: Neck supple.  Cardiovascular: Normal rate, regular rhythm and normal heart sounds.  Exam reveals no gallop and no friction rub.   No murmur heard. Pulmonary/Chest: Effort normal. No respiratory distress. He has wheezes.  Speaking in complete  sentences. B/l wheezing. Prolonged exp phase.   Abdominal: Soft. He exhibits no distension. There is no tenderness.  Musculoskeletal: He exhibits no tenderness.  Lower extremities symmetric as compared to each other. No calf tenderness. Negative Homan's. No palpable cords. Minimal edema.    Neurological: He is alert.  Skin: Skin is warm and dry.  Psychiatric: He has a normal mood and affect. His behavior is normal. Thought content normal.    ED Course  Procedures (including critical care time) Labs Review Labs Reviewed  CBC WITH DIFFERENTIAL - Abnormal; Notable for the following:    RBC 4.14 (*)    All other components within normal limits  COMPREHENSIVE METABOLIC PANEL - Abnormal; Notable for the following:    Potassium 3.6 (*)    Glucose, Bld 170 (*)    Creatinine, Ser 1.54 (*)    Albumin 3.4 (*)    GFR calc non Af Amer 43 (*)    GFR calc Af Amer 50 (*)    All other components within normal limits  PRO B NATRIURETIC PEPTIDE  I-STAT TROPOININ, ED    Imaging Review No results found.   EKG Interpretation None      EKG:  Rhythm: biventricular paced Vent. rate 69 BPM PR interval 190 ms QRS duration 154 ms QT/QTc 424/454 ms ST segments: NS ST changes   MDM   Final diagnoses:  Dyspnea    72yM with dyspnea. Suspect more pulmonary issue from [place] exacerbation as opossed to HF although may potentially be contributing to some degree.     Virgel Manifold, MD 11/15/13 [phone]

## 2013-11-21 ENCOUNTER — Emergency Department (HOSPITAL_COMMUNITY): Payer: Medicare HMO

## 2013-11-21 ENCOUNTER — Encounter (HOSPITAL_COMMUNITY): Payer: Self-pay | Admitting: Family Medicine

## 2013-11-21 ENCOUNTER — Inpatient Hospital Stay (HOSPITAL_COMMUNITY)
Admission: EM | Admit: 2013-11-21 | Discharge: 2013-11-26 | DRG: 291 | Disposition: A | Discharge status: Home / Self Care | Payer: Medicare HMO | Attending: Internal Medicine | Admitting: Internal Medicine

## 2013-11-21 ENCOUNTER — Inpatient Hospital Stay (HOSPITAL_COMMUNITY): Payer: Medicare HMO

## 2013-11-21 DIAGNOSIS — N183 Chronic kidney disease, stage 3 unspecified: Secondary | ICD-10-CM | POA: Diagnosis present

## 2013-11-21 DIAGNOSIS — I129 Hypertensive chronic kidney disease with stage 1 through stage 4 chronic kidney disease, or unspecified chronic kidney disease: Secondary | ICD-10-CM | POA: Diagnosis present

## 2013-11-21 DIAGNOSIS — I5021 Acute systolic (congestive) heart failure: Secondary | ICD-10-CM

## 2013-11-21 DIAGNOSIS — I255 Ischemic cardiomyopathy: Secondary | ICD-10-CM | POA: Diagnosis present

## 2013-11-21 DIAGNOSIS — I493 Ventricular premature depolarization: Secondary | ICD-10-CM | POA: Diagnosis present

## 2013-11-21 DIAGNOSIS — Z7952 Long term (current) use of systemic steroids: Secondary | ICD-10-CM | POA: Diagnosis not present

## 2013-11-21 DIAGNOSIS — I428 Other cardiomyopathies: Secondary | ICD-10-CM | POA: Diagnosis present

## 2013-11-21 DIAGNOSIS — Z7901 Long term (current) use of anticoagulants: Secondary | ICD-10-CM | POA: Diagnosis not present

## 2013-11-21 DIAGNOSIS — R0902 Hypoxemia: Secondary | ICD-10-CM | POA: Diagnosis present

## 2013-11-21 DIAGNOSIS — R042 Hemoptysis: Secondary | ICD-10-CM | POA: Diagnosis present

## 2013-11-21 DIAGNOSIS — R911 Solitary pulmonary nodule: Secondary | ICD-10-CM | POA: Diagnosis present

## 2013-11-21 DIAGNOSIS — Z95810 Presence of automatic (implantable) cardiac defibrillator: Secondary | ICD-10-CM

## 2013-11-21 DIAGNOSIS — J441 Chronic obstructive pulmonary disease with (acute) exacerbation: Secondary | ICD-10-CM | POA: Diagnosis present

## 2013-11-21 DIAGNOSIS — E876 Hypokalemia: Secondary | ICD-10-CM | POA: Diagnosis present

## 2013-11-21 DIAGNOSIS — I5023 Acute on chronic systolic (congestive) heart failure: Secondary | ICD-10-CM | POA: Diagnosis present

## 2013-11-21 DIAGNOSIS — M109 Gout, unspecified: Secondary | ICD-10-CM | POA: Diagnosis present

## 2013-11-21 DIAGNOSIS — Z955 Presence of coronary angioplasty implant and graft: Secondary | ICD-10-CM

## 2013-11-21 DIAGNOSIS — I2510 Atherosclerotic heart disease of native coronary artery without angina pectoris: Secondary | ICD-10-CM | POA: Diagnosis present

## 2013-11-21 DIAGNOSIS — Z87891 Personal history of nicotine dependence: Secondary | ICD-10-CM | POA: Diagnosis not present

## 2013-11-21 DIAGNOSIS — I509 Heart failure, unspecified: Secondary | ICD-10-CM | POA: Insufficient documentation

## 2013-11-21 DIAGNOSIS — Z7951 Long term (current) use of inhaled steroids: Secondary | ICD-10-CM

## 2013-11-21 DIAGNOSIS — J9621 Acute and chronic respiratory failure with hypoxia: Secondary | ICD-10-CM | POA: Diagnosis present

## 2013-11-21 DIAGNOSIS — J440 Chronic obstructive pulmonary disease with acute lower respiratory infection: Secondary | ICD-10-CM | POA: Diagnosis present

## 2013-11-21 DIAGNOSIS — Z888 Allergy status to other drugs, medicaments and biological substances status: Secondary | ICD-10-CM | POA: Diagnosis not present

## 2013-11-21 DIAGNOSIS — E119 Type 2 diabetes mellitus without complications: Secondary | ICD-10-CM | POA: Diagnosis present

## 2013-11-21 DIAGNOSIS — R0600 Dyspnea, unspecified: Secondary | ICD-10-CM | POA: Insufficient documentation

## 2013-11-21 DIAGNOSIS — Z86718 Personal history of other venous thrombosis and embolism: Secondary | ICD-10-CM

## 2013-11-21 DIAGNOSIS — I480 Paroxysmal atrial fibrillation: Secondary | ICD-10-CM | POA: Diagnosis present

## 2013-11-21 DIAGNOSIS — J449 Chronic obstructive pulmonary disease, unspecified: Secondary | ICD-10-CM

## 2013-11-21 LAB — URINALYSIS, ROUTINE W REFLEX MICROSCOPIC
BILIRUBIN URINE: NEGATIVE
Glucose, UA: NEGATIVE mg/dL
Ketones, ur: NEGATIVE mg/dL
Leukocytes, UA: NEGATIVE
NITRITE: NEGATIVE
PH: 6.5 (ref 5.0–8.0)
Protein, ur: NEGATIVE mg/dL
Specific Gravity, Urine: 1.006 (ref 1.005–1.030)
Urobilinogen, UA: 0.2 mg/dL (ref 0.0–1.0)

## 2013-11-21 LAB — BASIC METABOLIC PANEL
Anion gap: 11 (ref 5–15)
BUN: 21 mg/dL (ref 6–23)
CHLORIDE: 100 meq/L (ref 96–112)
CO2: 29 mEq/L (ref 19–32)
Calcium: 8.9 mg/dL (ref 8.4–10.5)
Creatinine, Ser: 1.52 mg/dL — ABNORMAL HIGH (ref 0.50–1.35)
GFR calc non Af Amer: 44 mL/min — ABNORMAL LOW (ref >90)
GFR, EST AFRICAN AMERICAN: 51 mL/min — AB (ref >90)
Glucose, Bld: 150 mg/dL — ABNORMAL HIGH (ref 70–99)
Potassium: 3.6 mEq/L — ABNORMAL LOW (ref 3.7–5.3)
Sodium: 140 mEq/L (ref 137–147)

## 2013-11-21 LAB — PROTIME-INR
INR: 3.29 — ABNORMAL HIGH (ref 0.00–1.49)
PROTHROMBIN TIME: 33.7 s — AB (ref 11.6–15.2)

## 2013-11-21 LAB — CBC
HCT: 39.4 % (ref 39.0–52.0)
Hemoglobin: 13.4 g/dL (ref 13.0–17.0)
MCH: 32.7 pg (ref 26.0–34.0)
MCHC: 34 g/dL (ref 30.0–36.0)
MCV: 96.1 fL (ref 78.0–100.0)
Platelets: 117 10*3/uL — ABNORMAL LOW (ref 150–400)
RBC: 4.1 MIL/uL — ABNORMAL LOW (ref 4.22–5.81)
RDW: 14.7 % (ref 11.5–15.5)
WBC: 7.4 10*3/uL (ref 4.0–10.5)

## 2013-11-21 LAB — TROPONIN I
Troponin I: <0.3 ng/mL (ref <0.30)
Troponin I: <0.3 ng/mL (ref <0.30)
Troponin I: <0.3 ng/mL (ref <0.30)

## 2013-11-21 LAB — URINE MICROSCOPIC-ADD ON

## 2013-11-21 LAB — PRO B NATRIURETIC PEPTIDE: Pro B Natriuretic peptide (BNP): 14338 pg/mL — ABNORMAL HIGH (ref 0–125)

## 2013-11-21 LAB — I-STAT TROPONIN, ED: Troponin i, poc: 0.09 ng/mL (ref 0.00–0.08)

## 2013-11-21 LAB — DIGOXIN LEVEL: Digoxin Level: 0.8 ng/mL (ref 0.8–2.0)

## 2013-11-21 MED ORDER — ACETAMINOPHEN 325 MG PO TABS
650.0000 mg | ORAL_TABLET | ORAL | Status: DC | PRN
  Administered 2013-11-23 – 2013-11-24 (×2): 650 mg via ORAL
  Filled 2013-11-21 (×2): qty 2

## 2013-11-21 MED ORDER — SODIUM CHLORIDE 0.9 % IJ SOLN
3.0000 mL | INTRAMUSCULAR | Status: DC | PRN
  Administered 2013-11-25: 3 mL via INTRAVENOUS
  Filled 2013-11-21: qty 3

## 2013-11-21 MED ORDER — POTASSIUM CHLORIDE CRYS ER 20 MEQ PO TBCR
40.0000 meq | EXTENDED_RELEASE_TABLET | Freq: Once | ORAL | Status: AC
  Administered 2013-11-21: 40 meq via ORAL
  Filled 2013-11-21: qty 2

## 2013-11-21 MED ORDER — FUROSEMIDE 10 MG/ML IJ SOLN
60.0000 mg | Freq: Once | INTRAMUSCULAR | Status: AC
  Administered 2013-11-21: 60 mg via INTRAVENOUS
  Filled 2013-11-21: qty 6

## 2013-11-21 MED ORDER — ALBUTEROL SULFATE (2.5 MG/3ML) 0.083% IN NEBU
2.5000 mg | INHALATION_SOLUTION | Freq: Four times a day (QID) | RESPIRATORY_TRACT | Status: DC | PRN
  Administered 2013-11-25: 2.5 mg via RESPIRATORY_TRACT
  Filled 2013-11-21: qty 3

## 2013-11-21 MED ORDER — CARVEDILOL 12.5 MG PO TABS
12.5000 mg | ORAL_TABLET | Freq: Two times a day (BID) | ORAL | Status: DC
  Administered 2013-11-21 – 2013-11-26 (×11): 12.5 mg via ORAL
  Filled 2013-11-21 (×12): qty 1

## 2013-11-21 MED ORDER — LEVOFLOXACIN IN D5W 750 MG/150ML IV SOLN
750.0000 mg | INTRAVENOUS | Status: DC
  Administered 2013-11-21: 750 mg via INTRAVENOUS
  Filled 2013-11-21: qty 150

## 2013-11-21 MED ORDER — SODIUM CHLORIDE 0.9 % IV SOLN: 250.0000 mL | INTRAVENOUS | Status: DC | PRN

## 2013-11-21 MED ORDER — DIGOXIN 125 MCG PO TABS
125.0000 ug | ORAL_TABLET | Freq: Every day | ORAL | Status: DC
  Administered 2013-11-22 – 2013-11-26 (×5): 125 ug via ORAL
  Filled 2013-11-21 (×5): qty 1

## 2013-11-21 MED ORDER — SODIUM CHLORIDE 0.9 % IJ SOLN
3.0000 mL | Freq: Two times a day (BID) | INTRAMUSCULAR | Status: DC
  Administered 2013-11-21 – 2013-11-26 (×11): 3 mL via INTRAVENOUS

## 2013-11-21 MED ORDER — UMECLIDINIUM-VILANTEROL 62.5-25 MCG/INH IN AEPB: 62.5000 ug | INHALATION_SPRAY | Freq: Every day | RESPIRATORY_TRACT | Status: DC

## 2013-11-21 MED ORDER — FLUTICASONE PROPIONATE 50 MCG/ACT NA SUSP
1.0000 | Freq: Two times a day (BID) | NASAL | Status: DC
  Administered 2013-11-21 – 2013-11-26 (×11): 1 via NASAL
  Filled 2013-11-21: qty 16

## 2013-11-21 MED ORDER — IPRATROPIUM-ALBUTEROL 0.5-2.5 (3) MG/3ML IN SOLN
3.0000 mL | RESPIRATORY_TRACT | Status: DC
  Administered 2013-11-21: 3 mL via RESPIRATORY_TRACT
  Filled 2013-11-21: qty 3

## 2013-11-21 MED ORDER — ONDANSETRON HCL 4 MG/2ML IJ SOLN: 4.0000 mg | Freq: Four times a day (QID) | INTRAMUSCULAR | Status: DC | PRN

## 2013-11-21 MED ORDER — LORATADINE 10 MG PO TABS
10.0000 mg | ORAL_TABLET | Freq: Every day | ORAL | Status: DC
  Administered 2013-11-22 – 2013-11-26 (×5): 10 mg via ORAL
  Filled 2013-11-21 (×5): qty 1

## 2013-11-21 MED ORDER — IPRATROPIUM-ALBUTEROL 0.5-2.5 (3) MG/3ML IN SOLN
3.0000 mL | Freq: Two times a day (BID) | RESPIRATORY_TRACT | Status: DC
  Administered 2013-11-22 – 2013-11-25 (×7): 3 mL via RESPIRATORY_TRACT
  Filled 2013-11-21 (×7): qty 3

## 2013-11-21 MED ORDER — ATORVASTATIN CALCIUM 10 MG PO TABS
10.0000 mg | ORAL_TABLET | Freq: Every day | ORAL | Status: DC
  Administered 2013-11-21 – 2013-11-25 (×5): 10 mg via ORAL
  Filled 2013-11-21 (×6): qty 1

## 2013-11-21 MED ORDER — ALLOPURINOL 100 MG PO TABS
100.0000 mg | ORAL_TABLET | Freq: Every day | ORAL | Status: DC
  Administered 2013-11-22 – 2013-11-26 (×5): 100 mg via ORAL
  Filled 2013-11-21 (×5): qty 1

## 2013-11-21 MED ORDER — ISOSORB DINITRATE-HYDRALAZINE 20-37.5 MG PO TABS
1.0000 | ORAL_TABLET | Freq: Three times a day (TID) | ORAL | Status: DC
  Administered 2013-11-21 – 2013-11-26 (×16): 1 via ORAL
  Filled 2013-11-21 (×17): qty 1

## 2013-11-21 MED ORDER — FUROSEMIDE 10 MG/ML IJ SOLN
40.0000 mg | Freq: Two times a day (BID) | INTRAMUSCULAR | Status: DC
  Administered 2013-11-21 – 2013-11-23 (×4): 40 mg via INTRAVENOUS
  Filled 2013-11-21 (×6): qty 4

## 2013-11-21 MED ORDER — ALFUZOSIN HCL ER 10 MG PO TB24
10.0000 mg | ORAL_TABLET | Freq: Every day | ORAL | Status: DC
  Administered 2013-11-22 – 2013-11-26 (×5): 10 mg via ORAL
  Filled 2013-11-21 (×5): qty 1

## 2013-11-21 NOTE — Progress Notes (Signed)
ANTIBIOTIC CONSULT NOTE - INITIAL  Pharmacy Consult for levaquin Indication: CAP  Allergies  Allergen Reactions  . Benadryl [Diphenhydramine Hcl] Nausea And Vomiting    Patient Measurements: Height: 5\' 5"  (165.1 cm) Weight: 181 lb 14.1 oz (82.5 kg) (scale C) IBW/kg (Calculated) : 61.5   Vital Signs: Temp: 97.6 F (36.4 C) (11/01 1438) Temp Source: Oral (11/01 1438) BP: 111/96 mmHg (11/01 1438) Pulse Rate: 67 (11/01 1438) Intake/Output from previous day:   Intake/Output from this shift: Total I/O In: -  Out: 640 [Urine:640]  Labs:  Recent Labs  11/21/13 1109  WBC 7.4  HGB 13.4  PLT 117*  CREATININE 1.52*   Estimated Creatinine Clearance: 42.8 mL/min (by C-G formula based on Cr of 1.52). No results for input(s): VANCOTROUGH, VANCOPEAK, VANCORANDOM, GENTTROUGH, GENTPEAK, GENTRANDOM, TOBRATROUGH, TOBRAPEAK, TOBRARND, AMIKACINPEAK, AMIKACINTROU, AMIKACIN in the last 72 hours.   Microbiology: No results found for this or any previous visit (from the past 720 hour(s)).  Medical History: Past Medical History  Diagnosis Date  . CAD (coronary artery disease) 1996    status post PCI of the RCA   . Ischemic dilated cardiomyopathy   . HTN (hypertension)   . GERD (gastroesophageal reflux disease)   . Personal history of DVT (deep vein thrombosis)   . Diabetes mellitus, type 2   . DJD (degenerative joint disease)   . Gout   . Prostatitis   . Nephrolithiasis   . Atrial fibrillation   . Congestive heart failure, unspecified   . Other primary cardiomyopathies     Medications:  Prescriptions prior to admission  Medication Sig Dispense Refill Last Dose  . albuterol (ACCUNEB) 1.25 MG/3ML nebulizer solution Take 3 mLs (1.25 mg total) by nebulization every 6 (six) hours as needed for wheezing. 75 mL 3 11/21/2013 at Unknown time  . albuterol (PROVENTIL HFA;VENTOLIN HFA) 108 (90 BASE) MCG/ACT inhaler Inhale 2 puffs into the lungs every 4 (four) hours as needed for wheezing  or shortness of breath. 1 Inhaler 11 11/21/2013 at Unknown time  . alfuzosin (UROXATRAL) 10 MG 24 hr tablet Take 10 mg by mouth daily.   11/21/2013 at Unknown time  . allopurinol (ZYLOPRIM) 100 MG tablet Take 100 mg by mouth daily.   11/21/2013 at Unknown time  . atorvastatin (LIPITOR) 10 MG tablet Take 1 tablet (10 mg total) by mouth at bedtime. 90 tablet 3 11/20/2013 at Unknown time  . carvedilol (COREG) 12.5 MG tablet Take 12.5 mg by mouth 2 (two) times daily with a meal.   11/21/2013 at 0700  . digoxin (LANOXIN) 0.125 MG tablet Take 125 mcg by mouth daily.   11/21/2013 at Unknown time  . fluticasone (FLONASE) 50 MCG/ACT nasal spray Place 1 spray into both nostrils 2 (two) times daily.   11/21/2013 at Unknown time  . loratadine (CLARITIN) 10 MG tablet Take 10 mg by mouth daily. For allergies   11/21/2013 at Unknown time  . losartan (COZAAR) 50 MG tablet Take 50 mg by mouth daily.   11/21/2013 at Unknown time  . torsemide (DEMADEX) 20 MG tablet Take 20 mg by mouth 2 (two) times daily.    11/21/2013 at Unknown time  . Umeclidinium-Vilanterol 62.5-25 MCG/INH AEPB Inhale 62.5 mcg into the lungs daily. 30 each 11 11/21/2013 at Unknown time  . warfarin (COUMADIN) 1 MG tablet Take 2-3 mg by mouth See admin instructions. Takes 2 tablets on Monday, Tuesday and then take 3 tablets on all other days   11/20/2013 at Unknown time  . predniSONE (  DELTASONE) 20 MG tablet Take 2 tablets (40 mg total) by mouth daily. 10 tablet 0    Assessment: 73 yo M with dyspnea.  Pharmacy consulted to dose levaquin for CAP.  Wt 82.5 kg. Creat 1.5w, creat cl < 50 ml/min.   WBC 7.4; AF, 11/1 CT chest w/o contrast:  Small B pleural effusions, R>L, Patchy ground-glass and airspace opacities in the right upper and middle lobes worrisome for an inflammatory process. Small pericardial effusion  Goal of Therapy:  Eradication of infection   Plan:  -levaquin 750 mg IV q48h  Eudelia Bunch, Pharm.D. [id number] 11/21/2013 3:29 PM

## 2013-11-21 NOTE — Consult Note (Signed)
Chief Complaint:  Dyspnea  Cardiologist: Taylor/Varanasi  HPI:  This is a 73 y.o. male with a past medical history significant for mixed ischemic and nonischemic cardiomyopathy (EF 25-30% May 4098), chronic systolic heart failure (class II), paroxysmal atrial fibrillation, s/p CRT-D (ST. Jude), peripheral venous insufficiency, COPD (FEV1 1.1L, 44% pred July 2015) who presents with severe dyspnea at rest and orthopnea, hypoxia.  CXR shows mild HF, creat 1.5 (baseline). BNP 14k has doubled since October  A little better after diuretics. Much better with O2.  He was seen in the ED 11/07/13 with similar complaints, felt to be more likely due to COPD. Did not require admission. Last device check 8/21, relatively low burden AF (1%, longest 10 min), lots of PVCs (8.3%) with BiV pacing efficiency only 81%. Underlying rhythm is complete heart block. Corvue was normal.  PMHx:  Past Medical History  Diagnosis Date  . CAD (coronary artery disease) 1996    status post PCI of the RCA   . Ischemic dilated cardiomyopathy   . HTN (hypertension)   . GERD (gastroesophageal reflux disease)   . Personal history of DVT (deep vein thrombosis)   . Diabetes mellitus, type 2   . DJD (degenerative joint disease)   . Gout   . Prostatitis   . Nephrolithiasis   . Atrial fibrillation   . Congestive heart failure, unspecified   . Other primary cardiomyopathies     Past Surgical History  Procedure Laterality Date  . Back surgery    . Coronary stent placement      FAMHx:  Family History  Problem Relation Age of Onset  . Cancer Father     colon  . Stroke Paternal Uncle     SOCHx:   reports that he quit smoking about 33 years ago. His smoking use included Cigarettes. He has a 200 pack-year smoking history. He has never used smokeless tobacco. He reports that he does not drink alcohol or use illicit drugs.  ALLERGIES:  Allergies  Allergen Reactions  . Benadryl [Diphenhydramine Hcl] Nausea And  Vomiting    ROS: The patient specifically denies any chest pain at rest or with exertion, ICD shocks, syncope, palpitations, focal neurological deficits, intermittent claudication, change in lower extremity edema, unexplained weight gain, cough, hemoptysis or wheezing.  The patient also denies abdominal pain, nausea, vomiting, dysphagia, diarrhea, constipation, polyuria, polydipsia, dysuria, hematuria, frequency, urgency, abnormal bleeding or bruising, fever, chills, unexpected weight changes, mood swings, change in skin or hair texture, change in voice quality, auditory or visual problems, allergic reactions or rashes, new musculoskeletal complaints other than usual "aches and pains".   HOME MEDS:  (Not in a hospital admission)  LABS/IMAGING: Results for orders placed or performed during the hospital encounter of 11/21/13 (from the past 48 hour(s))  CBC     Status: Abnormal   Collection Time: 11/21/13 11:09 AM  Result Value Ref Range   WBC 7.4 4.0 - 10.5 K/uL   RBC 4.10 (L) 4.22 - 5.81 MIL/uL   Hemoglobin 13.4 13.0 - 17.0 g/dL   HCT 39.4 39.0 - 52.0 %   MCV 96.1 78.0 - 100.0 fL   MCH 32.7 26.0 - 34.0 pg   MCHC 34.0 30.0 - 36.0 g/dL   RDW 14.7 11.5 - 15.5 %   Platelets 117 (L) 150 - 400 K/uL    Comment: CONSISTENT WITH PREVIOUS RESULT  Basic metabolic panel     Status: Abnormal   Collection Time: 11/21/13 11:09 AM  Result Value Ref Range  Sodium 140 137 - 147 mEq/L   Potassium 3.6 (L) 3.7 - 5.3 mEq/L   Chloride 100 96 - 112 mEq/L   CO2 29 19 - 32 mEq/L   Glucose, Bld 150 (H) 70 - 99 mg/dL   BUN 21 6 - 23 mg/dL   Creatinine, Ser 1.52 (H) 0.50 - 1.35 mg/dL   Calcium 8.9 8.4 - 10.5 mg/dL   GFR calc non Af Amer 44 (L) >90 mL/min   GFR calc Af Amer 51 (L) >90 mL/min    Comment: (NOTE) The eGFR has been calculated using the CKD EPI equation. This calculation has not been validated in all clinical situations. eGFR's persistently <90 mL/min signify possible Chronic  Kidney Disease.    Anion gap 11 5 - 15  BNP (order ONLY if patient complains of dyspnea/SOB AND you have documented it for THIS visit)     Status: Abnormal   Collection Time: 11/21/13 11:09 AM  Result Value Ref Range   Pro B Natriuretic peptide (BNP) 14338.0 (H) 0 - 125 pg/mL  I-stat troponin, ED (not at [facility])     Status: Abnormal   Collection Time: 11/21/13 11:38 AM  Result Value Ref Range   Troponin i, poc 0.09 (HH) 0.00 - 0.08 ng/mL   Comment NOTIFIED PHYSICIAN    Comment 3            Comment: Due to the release kinetics of cTnI, a negative result within the first hours of the onset of symptoms does not rule out myocardial infarction with certainty. If myocardial infarction is still suspected, repeat the test at appropriate intervals.   Troponin I     Status: None   Collection Time: 11/21/13 11:54 AM  Result Value Ref Range   Troponin I <0.30 <0.30 ng/mL    Comment:        Due to the release kinetics of cTnI, a negative result within the first hours of the onset of symptoms does not rule out myocardial infarction with certainty. If myocardial infarction is still suspected, repeat the test at appropriate intervals.    Dg Chest Port 1 View  11/21/2013   CLINICAL DATA:  Shortness of breath.  EXAM: PORTABLE CHEST - 1 VIEW  COMPARISON:  11/07/2013.  FINDINGS: Mediastinum and hilar structures are normal. Cardiomegaly. Mild pulmonary vascular prominence. Mild interstitial prominence. Partially cleared from prior exam. Left pleural effusion has cleared. Cardiac pacer and lead tips in right atrium right ventricle. No pneumothorax. No acute bony abnormality.  IMPRESSION: 1. Mild congestive heart failure. Mild interstitial edema has partially cleared from prior exam. Left pleural effusion has cleared. 2. Cardiac pacer with lead tips in right atrium right ventricle.   Electronically Signed   By: Marcello Moores  Register   On: 11/21/2013 11:37    VITALS: Blood pressure 137/88, pulse 64,  temperature 97.7 F (36.5 C), temperature source Oral, resp. rate 22, SpO2 98 %.  EXAM:  General: Alert, oriented x3, no distress Head: no evidence of trauma, PERRL, strabismus, no exophtalmos or lid lag, no myxedema, no xanthelasma; normal ears, nose and oropharynx Neck: elevated (6-7 cm) jugular venous pulsations and hepatojugular reflux; brisk carotid pulses without delay and no carotid bruits Chest: clear to auscultation, no signs of consolidation by percussion or palpation, normal fremitus, symmetrical and full respiratory excursions Cardiovascular: laterally displaced apical impulse, regular rhythm with frequent ectopy, normal first heart sound and paradoxically split second heart sounds, no rubs or gallops, 1/6 holosystolic murmur LLSB Abdomen: no tenderness or distention, no  masses by palpation, no abnormal pulsatility or arterial bruits, normal bowel sounds, no hepatosplenomegaly Extremities: prominent bilateral varicose veins, no clubbing, cyanosis; chronic appearing 1+ bilateral pretibial edema; 2+ radial, ulnar and brachial pulses bilaterally; 2+ right femoral, posterior tibial and dorsalis pedis pulses; 2+ left femoral, posterior tibial and dorsalis pedis pulses; no subclavian or femoral bruits Neurological: grossly nonfocal   IMPRESSION: Acute on chronic systolic HF CAD without symptoms of acute coronary insufficiency CRT-D with poor BiV pacing % due to arrhythmia (PVC) History of paroxysmal atrial fibrillation  Chronic anticoagulant Rx for PAF and DVT, mildly supratherapeutic COPD, 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 Interrogate CRT-D device for corvue and CRT percentage Monitor for worsening of chronic renal failure. May need home O2 at DC - suspect COPD remains a big part of his tendency to decompensate. Need to increase vasodilator therapy. Conventional ACEi/ARB and beta blockers limited by comorbid conditions. Add Bidil.  Sanda Klein, MD, [facility] [phone] office [phone] pager  11/21/2013, 2:19 PM

## 2013-11-21 NOTE — H&P (Signed)
[facility] History and Physical  Troy [name] [id number] DOB: 04-Jan-1941 DOA: 11/21/2013  Referring physician: EDP PCP: Nena Jordan, Mammie Lorenzo, MD   Chief Complaint: sob since many months, worsened over the last few days.   HPI: Troy [name] is a 73 y.o. male  With h/o ischemic cardiomyopathy, st Jude IVD placement, chronic systolic heart failure on torsemide, copd , CKD stage 1, comes in for worsening sob over the last few days , with persistent productive cough, with hemoptysis today. He reports he saw a small amount of bright red blood for the first time this morning. He reports orthoPnea, no PND, pedal edema present. On arrival to ED, his PRO BNP was found to be elevated and with mild hypokalemia and in acute on CKD. He was referred to medical service for admission and cardiology consulted by EDP.    Review of Systems:  Constitutional:  No weight loss, night sweats, Fevers, chills, fatigue.  HEENT:  No headaches, Difficulty swallowing,Tooth/dental problems,Sore throat,  No sneezing, itching, ear ache, nasal congestion, post nasal drip,  Cardio-vascular:  Non chest pain, sob, orthopnea, no PND, pedal edema present.  GI:  No heartburn, indigestion, abdominal pain, nausea, vomiting, diarrhea, change in bowel habits, loss of appetite  Resp:  Productive cough, sob and some hemoptysis seen today.  Skin:  no rash or lesions.  GU:  no dysuria, change in color of urine, no urgency or frequency. No flank pain.  Musculoskeletal:  No joint pain or swelling. No decreased range of motion. No back pain.  Psych:  No change in mood or affect. No depression or anxiety. No memory loss.   Past Medical History  Diagnosis Date  . CAD (coronary artery disease) 1996    status post PCI of the RCA   . Ischemic dilated cardiomyopathy   . HTN (hypertension)   . GERD (gastroesophageal reflux disease)   . Personal history of DVT (deep vein thrombosis)   . Diabetes mellitus,  type 2   . DJD (degenerative joint disease)   . Gout   . Prostatitis   . Nephrolithiasis   . Atrial fibrillation   . Congestive heart failure, unspecified   . Other primary cardiomyopathies    Past Surgical History  Procedure Laterality Date  . Back surgery    . Coronary stent placement     Social History:  reports that he quit smoking about 33 years ago. His smoking use included Cigarettes. He has a 200 pack-year smoking history. He has never used smokeless tobacco. He reports that he does not drink alcohol or use illicit drugs.  Allergies  Allergen Reactions  . Benadryl [Diphenhydramine Hcl] Nausea And Vomiting    Family History  Problem Relation Age of Onset  . Cancer Father     colon  . Stroke Paternal Uncle      Prior to Admission medications   Medication Sig Start Date End Date Taking? Authorizing Provider  albuterol (ACCUNEB) 1.25 MG/3ML nebulizer solution Take 3 mLs (1.25 mg total) by nebulization every 6 (six) hours as needed for wheezing. 11/07/13  Yes Virgel Manifold, MD  albuterol (PROVENTIL HFA;VENTOLIN HFA) 108 (90 BASE) MCG/ACT inhaler Inhale 2 puffs into the lungs every 4 (four) hours as needed for wheezing or shortness of breath. 07/29/13  Yes Collene Gobble, MD  alfuzosin (UROXATRAL) 10 MG 24 hr tablet Take 10 mg by mouth daily.   Yes Historical Provider, MD  allopurinol (ZYLOPRIM) 100 MG tablet Take 100 mg by mouth daily.  Yes Historical Provider, MD  atorvastatin (LIPITOR) 10 MG tablet Take 1 tablet (10 mg total) by mouth at bedtime. 06/15/13  Yes Jettie Booze, MD  carvedilol (COREG) 12.5 MG tablet Take 12.5 mg by mouth 2 (two) times daily with a meal.   Yes Historical Provider, MD  digoxin (LANOXIN) 0.125 MG tablet Take 125 mcg by mouth daily.   Yes Historical Provider, MD  fluticasone (FLONASE) 50 MCG/ACT nasal spray Place 1 spray into both nostrils 2 (two) times daily. 04/07/13  Yes Historical Provider, MD  loratadine (CLARITIN) 10 MG tablet Take 10 mg  by mouth daily. For allergies   Yes Historical Provider, MD  losartan (COZAAR) 50 MG tablet Take 50 mg by mouth daily.   Yes Historical Provider, MD  torsemide (DEMADEX) 20 MG tablet Take 20 mg by mouth 2 (two) times daily.    Yes Historical Provider, MD  Umeclidinium-Vilanterol 62.5-25 MCG/INH AEPB Inhale 62.5 mcg into the lungs daily. 06/16/13  Yes Collene Gobble, MD  warfarin (COUMADIN) 1 MG tablet Take 2-3 mg by mouth See admin instructions. Takes 2 tablets on Monday, Tuesday and then take 3 tablets on all other days   Yes Historical Provider, MD  predniSONE (DELTASONE) 20 MG tablet Take 2 tablets (40 mg total) by mouth daily. 11/07/13   Virgel Manifold, MD   Physical Exam: Filed Vitals:   11/21/13 1327 11/21/13 1330 11/21/13 1400 11/21/13 1438  BP: 152/76 137/88 132/74 111/96  Pulse: 76 64 52 67  Temp:    97.6 F (36.4 C)  TempSrc:    Oral  Resp: 19 22 25 20   Height:    5\' 5"  (1.651 m)  Weight:    82.5 kg (181 lb 14.1 oz)  SpO2: 98% 98% 100% 90%    Wt Readings from Last 3 Encounters:  11/21/13 82.5 kg (181 lb 14.1 oz)  09/10/13 86.183 kg (190 lb)  07/29/13 83.462 kg (184 lb)    General:  Appears calm and comfortable Eyes: PERRL, normal lids, irises & conjunctiva Neck: no LAD, masses or thyromegaly Cardiovascular: irregular, pedal edema present.  Respiratory:  Bibasilar rales, scattred wheezing heard.  Abdomen: soft, ntnd Skin: no rash or induration seen on limited exam Musculoskeletal: pedal edema present.  Neurologic: grossly non-focal.          Labs on Admission:  Basic Metabolic Panel:  Recent Labs Lab 11/21/13 1109  NA 140  K 3.6*  CL 100  CO2 29  GLUCOSE 150*  BUN 21  CREATININE 1.52*  CALCIUM 8.9   Liver Function Tests: No results for input(s): AST, ALT, ALKPHOS, BILITOT, PROT, ALBUMIN in the last 168 hours. No results for input(s): LIPASE, AMYLASE in the last 168 hours. No results for input(s): AMMONIA in the last 168 hours. CBC:  Recent Labs Lab  11/21/13 1109  WBC 7.4  HGB 13.4  HCT 39.4  MCV 96.1  PLT 117*   Cardiac Enzymes:  Recent Labs Lab 11/21/13 1154 11/21/13 1600  TROPONINI <0.30 <0.30    BNP (last 3 results)  Recent Labs  11/07/13 1114 11/21/13 1109  PROBNP 7451.0* 14338.0*   CBG: No results for input(s): GLUCAP in the last 168 hours.  Radiological Exams on Admission: Ct Chest Wo Contrast  11/21/2013   CLINICAL DATA:  Short of breath for 3 days.  Cough and hemoptysis.  EXAM: CT CHEST WITHOUT CONTRAST  TECHNIQUE: Multidetector CT imaging of the chest was performed following the standard protocol without IV contrast.  COMPARISON:  08/16/2005  FINDINGS: AICD device is in place from the left subclavian vein. Tips of the leads are in the right atrium, right ventricle, and coronary sinus.  No evidence of abnormal mediastinal adenopathy. Small nodes are noted  Small pericardial effusion is re- demonstrated.  No pneumothorax. Small bilateral pleural effusions right greater than left.  Patchy areas of ground-glass are scattered throughout the right lung with a prep collection for the right middle and upper lobes. 5 mm right upper lobe nodule on image 45. Patchy density at the base of the right middle lobe laterally on image 49 is atelectasis versus airspace disease. Similar confluent opacity in the upper posterior right middle lobe on image 32.  9 mm visceral pleural node in the right minor fissure is stable. Dependent atelectasis at the lung bases bilaterally.  Tiny left posterior basilar Bochdalek's hernia.  No acute vertebral compression deformity. There is kyphosis of the lower thoracic spine which has a chronic appearance.  Low-density lesion at the dome of the liver on image 50 is stable.  There is free fluid in the abdomen within the pericolic gutters.  IMPRESSION: Patchy ground-glass and airspace opacities in the right upper and middle lobes worrisome for an inflammatory process.  Small bilateral pleural effusions per   Small pericardial effusion  AICD device  Stable liver lesions supporting benign etiology.  Small amount of free fluid in the abdomen.  Stable right mm visceral pleural lymph node. New 5 mm right upper lobe pulmonary nodule. If the patient is at high risk for bronchogenic carcinoma, follow-up chest CT at 6-12 months is recommended. If the patient is at low risk for bronchogenic carcinoma, follow-up chest CT at 12 months is recommended. This recommendation follows the consensus statement: Guidelines for Management of Small Pulmonary Nodules Detected on CT Scans: A Statement from the [place] as published in Radiology 2005;237:395-400.   Electronically Signed   By: Maryclare Bean M.D.   On: 11/21/2013 15:04   Dg Chest Port 1 View  11/21/2013   CLINICAL DATA:  Shortness of breath.  EXAM: PORTABLE CHEST - 1 VIEW  COMPARISON:  11/07/2013.  FINDINGS: Mediastinum and hilar structures are normal. Cardiomegaly. Mild pulmonary vascular prominence. Mild interstitial prominence. Partially cleared from prior exam. Left pleural effusion has cleared. Cardiac pacer and lead tips in right atrium right ventricle. No pneumothorax. No acute bony abnormality.  IMPRESSION: 1. Mild congestive heart failure. Mild interstitial edema has partially cleared from prior exam. Left pleural effusion has cleared. 2. Cardiac pacer with lead tips in right atrium right ventricle.   Electronically Signed   By: Marcello Moores  Register   On: 11/21/2013 11:37    EKG: ? afib.  Assessment/Plan Active Problems:   Acute CHF   Acute on chronic systolic CHF (congestive heart failure)   Chronic kidney disease, stage 3   Coronary artery disease involving native coronary artery of native heart without angina pectoris   Paroxysmal atrial fibrillation   Frequent PVCs   Hypoxia   Acute on chronic respiratory failure with hypoxia   Chronic obstructive pulmonary disease with acute exacerbation   Acute on chronic CHF: - evident on cxr with pulmonary  edema. Admit to telemetry. IV lasix 40 mg bID, daily weights and strict intake and output. Echocardiogram ordered. Continue the rest of the home meds.    COPD with mild exacerbation: resume duonebs. And bronchodilators.    Acute on CKD: Holding losartan for worsened creatinine.   Paroxysmal afib : Rate controlled.    ischemic cardiomyopathy, s/p  IVCD placement: - resume coreg and nitrates.  - slightly elevated INR. No more episodes of hemoptysis. Currently holding coumadin tonight. Can resume coumadin tomorrow if no hemoptysis.    Hypokalemia: replete as needed.   Abnormal CT chest showing some ground glass opacities suspicious for infiltrate: with many days of productive cough will treat empirically with IV levaquin for pneumonia.    DVT prophylaxis. SCD'S      Code Status: full code  DVT Prophylaxis: Family Communication: family atbedside.  Disposition Plan: admit to telemetry.   Time spent: 65 minutes.   [facility] Pager [phone]

## 2013-11-21 NOTE — ED Notes (Signed)
Per pt sts SOB for a while and not better. sts given medication here and he hsa ran out. Pt tachypnea at triage. sats 85 at triage.

## 2013-11-21 NOTE — Plan of Care (Signed)
Problem: Phase II Progression Outcomes Goal: Dyspnea controlled with activity Outcome: Not Progressing New admission

## 2013-11-21 NOTE — ED Provider Notes (Signed)
CSN: [id number]     Arrival date & time 11/21/13  1012 History   First MD Initiated Contact with Patient 11/21/13 1048     Chief Complaint  Patient presents with  . Shortness of Breath     (Consider location/radiation/quality/duration/timing/severity/associated sxs/prior Treatment) HPI Comments: Patient with congestive heart failure, decreased ejection fraction, cardiac myopathy, defibrillator, coronary artery disease, atrial fibrillation, COPD presents with worsening shortness of breathfor the past 3 days. Patient had a chronic cough not worsening. No chest discomfort. Patient hypoxic on arrival 66, no oxygen at home. Shows of breath fairly constant, worse with walking. Patient has chronic orthopnea does not feel this is worse in normal. Patient is onwarfarin, digoxin and torsemide and takes fairly regularly. No fevers or chills. Patient recently finished antibiotics for unknown reason.  Patient is a 73 y.o. male presenting with shortness of breath. The history is provided by the patient.  Shortness of Breath Associated symptoms: cough   Associated symptoms: no abdominal pain, no chest pain, no fever, no headaches, no neck pain, no rash and no vomiting     Past Medical History  Diagnosis Date  . CAD (coronary artery disease) 1996    status post PCI of the RCA   . Ischemic dilated cardiomyopathy   . HTN (hypertension)   . GERD (gastroesophageal reflux disease)   . Personal history of DVT (deep vein thrombosis)   . Diabetes mellitus, type 2   . DJD (degenerative joint disease)   . Gout   . Prostatitis   . Nephrolithiasis   . Atrial fibrillation   . Congestive heart failure, unspecified   . Other primary cardiomyopathies    Past Surgical History  Procedure Laterality Date  . Back surgery    . Coronary stent placement     Family History  Problem Relation Age of Onset  . Cancer Father     colon  . Stroke Paternal Uncle    History  Substance Use Topics  . Smoking status:  Former Smoker -- 4.00 packs/day for 50 years    Types: Cigarettes    Quit date: 01/22/1980  . Smokeless tobacco: Never Used  . Alcohol Use: No     Comment: denies    Review of Systems  Constitutional: Negative for fever and chills.  HENT: Negative for congestion.   Eyes: Negative for visual disturbance.  Respiratory: Positive for cough and shortness of breath.   Cardiovascular: Negative for chest pain.  Gastrointestinal: Negative for vomiting and abdominal pain.  Genitourinary: Negative for dysuria and flank pain.  Musculoskeletal: Negative for back pain, neck pain and neck stiffness.  Skin: Negative for rash.  Neurological: Negative for light-headedness and headaches.      Allergies  Benadryl  Home Medications   Prior to Admission medications   Medication Sig Start Date End Date Taking? Authorizing Provider  albuterol (ACCUNEB) 1.25 MG/3ML nebulizer solution Take 3 mLs (1.25 mg total) by nebulization every 6 (six) hours as needed for wheezing. 11/07/13  Yes Virgel Manifold, MD  albuterol (PROVENTIL HFA;VENTOLIN HFA) 108 (90 BASE) MCG/ACT inhaler Inhale 2 puffs into the lungs every 4 (four) hours as needed for wheezing or shortness of breath. 07/29/13  Yes Collene Gobble, MD  alfuzosin (UROXATRAL) 10 MG 24 hr tablet Take 10 mg by mouth daily.   Yes Historical Provider, MD  allopurinol (ZYLOPRIM) 100 MG tablet Take 100 mg by mouth daily.   Yes Historical Provider, MD  atorvastatin (LIPITOR) 10 MG tablet Take 1 tablet (10 mg total)  by mouth at bedtime. 06/15/13  Yes Jettie Booze, MD  carvedilol (COREG) 12.5 MG tablet Take 12.5 mg by mouth 2 (two) times daily with a meal.   Yes Historical Provider, MD  digoxin (LANOXIN) 0.125 MG tablet Take 125 mcg by mouth daily.   Yes Historical Provider, MD  fluticasone (FLONASE) 50 MCG/ACT nasal spray Place 1 spray into both nostrils 2 (two) times daily. 04/07/13  Yes Historical Provider, MD  loratadine (CLARITIN) 10 MG tablet Take 10 mg by  mouth daily. For allergies   Yes Historical Provider, MD  losartan (COZAAR) 50 MG tablet Take 50 mg by mouth daily.   Yes Historical Provider, MD  torsemide (DEMADEX) 20 MG tablet Take 20 mg by mouth 2 (two) times daily.    Yes Historical Provider, MD  Umeclidinium-Vilanterol 62.5-25 MCG/INH AEPB Inhale 62.5 mcg into the lungs daily. 06/16/13  Yes Collene Gobble, MD  warfarin (COUMADIN) 1 MG tablet Take 2-3 mg by mouth See admin instructions. Takes 2 tablets on Monday, Tuesday and then take 3 tablets on all other days   Yes Historical Provider, MD  predniSONE (DELTASONE) 20 MG tablet Take 2 tablets (40 mg total) by mouth daily. 11/07/13   Virgel Manifold, MD   BP 152/76 mmHg  Pulse 76  Temp(Src) 97.7 F (36.5 C) (Oral)  Resp 19  SpO2 98% Physical Exam  Constitutional: He is oriented to person, place, and time. He appears well-developed and well-nourished.  HENT:  Head: Normocephalic and atraumatic.  Eyes: Conjunctivae are normal. Right eye exhibits no discharge. Left eye exhibits no discharge.  Neck: Normal range of motion. Neck supple. No tracheal deviation present.  Cardiovascular: Normal rate and regular rhythm.   Pulmonary/Chest: Respiratory distress: mild increased effort, mild tachypnea especially with activity in the room. He has rales (crackles at bases bilateral).  Abdominal: Soft. He exhibits no distension. There is no tenderness. There is no guarding.  Musculoskeletal: He exhibits edema (mild lower extremities bilateral tenderness).  Neurological: He is alert and oriented to person, place, and time.  Skin: Skin is warm. No rash noted.  Psychiatric: He has a normal mood and affect.  Nursing note and vitals reviewed.   ED Course  Procedures (including critical care time) Labs Review Labs Reviewed  CBC - Abnormal; Notable for the following:    RBC 4.10 (*)    Platelets 117 (*)    All other components within normal limits  BASIC METABOLIC PANEL - Abnormal; Notable for the  following:    Potassium 3.6 (*)    Glucose, Bld 150 (*)    Creatinine, Ser 1.52 (*)    GFR calc non Af Amer 44 (*)    GFR calc Af Amer 51 (*)    All other components within normal limits  PRO B NATRIURETIC PEPTIDE - Abnormal; Notable for the following:    Pro B Natriuretic peptide (BNP) 14338.0 (*)    All other components within normal limits  I-STAT TROPOININ, ED - Abnormal; Notable for the following:    Troponin i, poc 0.09 (*)    All other components within normal limits  TROPONIN I  DIGOXIN LEVEL  PROTIME-INR    Imaging Review Dg Chest Port 1 View  11/21/2013   CLINICAL DATA:  Shortness of breath.  EXAM: PORTABLE CHEST - 1 VIEW  COMPARISON:  11/07/2013.  FINDINGS: Mediastinum and hilar structures are normal. Cardiomegaly. Mild pulmonary vascular prominence. Mild interstitial prominence. Partially cleared from prior exam. Left pleural effusion has cleared. Cardiac pacer  and lead tips in right atrium right ventricle. No pneumothorax. No acute bony abnormality.  IMPRESSION: 1. Mild congestive heart failure. Mild interstitial edema has partially cleared from prior exam. Left pleural effusion has cleared. 2. Cardiac pacer with lead tips in right atrium right ventricle.   Electronically Signed   By: Marcello Moores  Register   On: 11/21/2013 11:37     EKG Interpretation None     EKG reviewed heart rate 74, wide QRS, pacemaker spikes seen, normal QT. MDM   Final diagnoses:  Acute systolic congestive heart failure  Dyspnea  Hypoxia   Patient with known congestive heart failure, COPD history presents with worsening shortness of breath and hypoxia. Patient requiring 2 L nasal cannula in ER. With crackles at the bases and significant heart failure history concerned more likely for acute congestive heart failure exacerbation, left pleural effusion has improved on the x-ray which was reviewed.   Plan for Lasix and admission to the hospital.  Discussed with both cardiology for consult and [facility]  hospitalist for admission.  The patients results and plan were reviewed and discussed.   Any x-rays performed were personally reviewed by myself.   Differential diagnosis were considered with the presenting HPI.  Medications  furosemide (LASIX) injection 60 mg (60 mg Intravenous Given 11/21/13 1248)  potassium chloride SA (K-DUR,KLOR-CON) CR tablet 40 mEq (40 mEq Oral Given 11/21/13 1252)    Filed Vitals:   11/21/13 1215 11/21/13 1230 11/21/13 1245 11/21/13 1327  BP: 157/52 148/52 149/68 152/76  Pulse: 37 110 68 76  Temp:      TempSrc:      Resp: 20 19 24 19   SpO2: 98% 99% 98% 98%    Final diagnoses:  Acute systolic congestive heart failure  Dyspnea  Hypoxia    Admission/ observation were discussed with the admitting physician, patient and/or family and they are comfortable with the plan.    Mariea Clonts, MD 11/21/13 1328

## 2013-11-21 NOTE — ED Notes (Signed)
pts remains monitored by blood pressure, pulse ox, and 5 lead.

## 2013-11-22 DIAGNOSIS — R0902 Hypoxemia: Secondary | ICD-10-CM

## 2013-11-22 DIAGNOSIS — J441 Chronic obstructive pulmonary disease with (acute) exacerbation: Secondary | ICD-10-CM

## 2013-11-22 DIAGNOSIS — R0600 Dyspnea, unspecified: Secondary | ICD-10-CM | POA: Insufficient documentation

## 2013-11-22 DIAGNOSIS — I359 Nonrheumatic aortic valve disorder, unspecified: Secondary | ICD-10-CM

## 2013-11-22 DIAGNOSIS — I480 Paroxysmal atrial fibrillation: Secondary | ICD-10-CM

## 2013-11-22 DIAGNOSIS — I493 Ventricular premature depolarization: Secondary | ICD-10-CM

## 2013-11-22 DIAGNOSIS — N183 Chronic kidney disease, stage 3 (moderate): Secondary | ICD-10-CM

## 2013-11-22 DIAGNOSIS — I5023 Acute on chronic systolic (congestive) heart failure: Principal | ICD-10-CM

## 2013-11-22 LAB — BASIC METABOLIC PANEL
Anion gap: 12 (ref 5–15)
BUN: 20 mg/dL (ref 6–23)
CALCIUM: 8.2 mg/dL — AB (ref 8.4–10.5)
CO2: 28 mEq/L (ref 19–32)
Chloride: 102 mEq/L (ref 96–112)
Creatinine, Ser: 1.41 mg/dL — ABNORMAL HIGH (ref 0.50–1.35)
GFR, EST AFRICAN AMERICAN: 56 mL/min — AB (ref >90)
GFR, EST NON AFRICAN AMERICAN: 48 mL/min — AB (ref >90)
GLUCOSE: 120 mg/dL — AB (ref 70–99)
POTASSIUM: 3.6 meq/L — AB (ref 3.7–5.3)
Sodium: 142 mEq/L (ref 137–147)

## 2013-11-22 LAB — PROTIME-INR
INR: 3.12 — AB (ref 0.00–1.49)
Prothrombin Time: 32.4 seconds — ABNORMAL HIGH (ref 11.6–15.2)

## 2013-11-22 LAB — TROPONIN I: Troponin I: <0.3 ng/mL (ref <0.30)

## 2013-11-22 MED ORDER — WARFARIN - PHARMACIST DOSING INPATIENT
Freq: Every day | Status: DC
  Administered 2013-11-22: 18:00:00
  Administered 2013-11-26: 1

## 2013-11-22 MED ORDER — GUAIFENESIN-DM 100-10 MG/5ML PO SYRP
5.0000 mL | ORAL_SOLUTION | ORAL | Status: DC | PRN
  Administered 2013-11-22 – 2013-11-25 (×4): 5 mL via ORAL
  Filled 2013-11-22 (×4): qty 5

## 2013-11-22 MED ORDER — POTASSIUM CHLORIDE CRYS ER 20 MEQ PO TBCR
40.0000 meq | EXTENDED_RELEASE_TABLET | Freq: Once | ORAL | Status: AC
  Administered 2013-11-22: 40 meq via ORAL
  Filled 2013-11-22: qty 2

## 2013-11-22 MED ORDER — WARFARIN SODIUM 1 MG PO TABS
1.0000 mg | ORAL_TABLET | Freq: Once | ORAL | Status: AC
  Administered 2013-11-22: 1 mg via ORAL
  Filled 2013-11-22: qty 1

## 2013-11-22 NOTE — Progress Notes (Signed)
Patient Name: Troy [name] Date of Encounter: 11/22/2013  Principal Problem:   Acute on chronic respiratory failure with hypoxia Active Problems:   Acute on chronic systolic CHF (congestive heart failure)   Chronic kidney disease, stage 3   Coronary artery disease involving native coronary artery of native heart without angina pectoris   Paroxysmal atrial fibrillation   Frequent PVCs   Hypoxia   Chronic obstructive pulmonary disease with acute exacerbation    Patient Profile: 73 yo male w/ hx ICM/NICM (EF 25-30% May 2014), [employer] ICD, Chronic S-CHF, PAF on coum, venous insuff, COPD, admitted 11/01 w/ hypoxia, CHF. Not on home O2.  SUBJECTIVE: Frequent non-productive cough. Breathing a little better.   OBJECTIVE Filed Vitals:   11/21/13 1700 11/21/13 2048 11/21/13 2107 11/22/13 0634  BP:  150/69  121/58  Pulse:  73 65 62  Temp:  98.7 F (37.1 C)  97.7 F (36.5 C)  TempSrc:  Oral  Oral  Resp:  19 20 17   Height:      Weight:    178 lb 12.7 oz (81.1 kg)  SpO2: 99% 98% 99% 94%    Intake/Output Summary (Last 24 hours) at 11/22/13 0852 Last data filed at 11/22/13 0825  Gross per 24 hour  Intake    870 ml  Output   3595 ml  Net  -2725 ml   Filed Weights   11/21/13 1438 11/22/13 0634  Weight: 181 lb 14.1 oz (82.5 kg) 178 lb 12.7 oz (81.1 kg)    PHYSICAL EXAM General: Well developed, well nourished, male in no acute distress. Head: Normocephalic, atraumatic.  Neck: Supple without bruits, JVD 11 cm. Lungs:  Resp regular and unlabored, significantly decreased BS bases, no wheeze. Heart: Irreg R&R, S1, S2, no S3, S4, or murmur; no rub. Abdomen: Soft, non-tender, non-distended, BS + x 4.  Extremities: No clubbing, cyanosis, no edema.  Neuro: Alert and oriented X 3. Moves all extremities spontaneously. Psych: Normal affect.  LABS: CBC:  Recent Labs  11/21/13 1109  WBC 7.4  HGB 13.4  HCT 39.4  MCV 96.1  PLT 117*   INR:  Recent Labs  11/22/13 0332   INR 123456*   Basic Metabolic Panel:  Recent Labs  11/21/13 1109 11/22/13 0332  NA 140 142  K 3.6* 3.6*  CL 100 102  CO2 29 28  GLUCOSE 150* 120*  BUN 21 20  CREATININE 1.52* 1.41*  CALCIUM 8.9 8.2*   Cardiac Enzymes:  Recent Labs  11/21/13 1600 11/21/13 2128 11/22/13 0332  TROPONINI <0.30 <0.30 <0.30    Recent Labs  11/21/13 1138  TROPIPOC 0.09*   BNP: PRO B NATRIURETIC PEPTIDE (BNP)  Date/Time Value Ref Range Status  11/21/2013 11:09 AM 14338.0* 0 - 125 pg/mL Final  11/07/2013 11:14 AM 7451.0* 0 - 125 pg/mL Final    TELE:   Atrial fib, +/- v-pacing, AV pacing, PVCs  Radiology/Studies: Ct Chest Wo Contrast 11/21/2013   CLINICAL DATA:  Short of breath for 3 days.  Cough and hemoptysis.  EXAM: CT CHEST WITHOUT CONTRAST  TECHNIQUE: Multidetector CT imaging of the chest was performed following the standard protocol without IV contrast.  COMPARISON:  08/16/2005  FINDINGS: AICD device is in place from the left subclavian vein. Tips of the leads are in the right atrium, right ventricle, and coronary sinus.  No evidence of abnormal mediastinal adenopathy. Small nodes are noted  Small pericardial effusion is re- demonstrated.  No pneumothorax. Small bilateral pleural effusions right  greater than left.  Patchy areas of ground-glass are scattered throughout the right lung with a prep collection for the right middle and upper lobes. 5 mm right upper lobe nodule on image 45. Patchy density at the base of the right middle lobe laterally on image 49 is atelectasis versus airspace disease. Similar confluent opacity in the upper posterior right middle lobe on image 32.  9 mm visceral pleural node in the right minor fissure is stable. Dependent atelectasis at the lung bases bilaterally.  Tiny left posterior basilar Bochdalek's hernia.  No acute vertebral compression deformity. There is kyphosis of the lower thoracic spine which has a chronic appearance.  Low-density lesion at the dome of the  liver on image 50 is stable.  There is free fluid in the abdomen within the pericolic gutters.  IMPRESSION: Patchy ground-glass and airspace opacities in the right upper and middle lobes worrisome for an inflammatory process.  Small bilateral pleural effusions per  Small pericardial effusion  AICD device  Stable liver lesions supporting benign etiology.  Small amount of free fluid in the abdomen.  Stable right mm visceral pleural lymph node. New 5 mm right upper lobe pulmonary nodule. If the patient is at high risk for bronchogenic carcinoma, follow-up chest CT at 6-12 months is recommended. If the patient is at low risk for bronchogenic carcinoma, follow-up chest CT at 12 months is recommended. This recommendation follows the consensus statement: Guidelines for Management of Small Pulmonary Nodules Detected on CT Scans: A Statement from the [employer] as published in Radiology 2005;237:395-400.   Electronically Signed   By: Maryclare Bean M.D.   On: 11/21/2013 15:04   Dg Chest Port 1 View 11/21/2013   CLINICAL DATA:  Shortness of breath.  EXAM: PORTABLE CHEST - 1 VIEW  COMPARISON:  11/07/2013.  FINDINGS: Mediastinum and hilar structures are normal. Cardiomegaly. Mild pulmonary vascular prominence. Mild interstitial prominence. Partially cleared from prior exam. Left pleural effusion has cleared. Cardiac pacer and lead tips in right atrium right ventricle. No pneumothorax. No acute bony abnormality.  IMPRESSION: 1. Mild congestive heart failure. Mild interstitial edema has partially cleared from prior exam. Left pleural effusion has cleared. 2. Cardiac pacer with lead tips in right atrium right ventricle.   Electronically Signed   By: Marcello Moores  Register   On: 11/21/2013 11:37     Current Medications:  . alfuzosin  10 mg Oral Daily  . allopurinol  100 mg Oral Daily  . atorvastatin  10 mg Oral QHS  . carvedilol  12.5 mg Oral BID WC  . digoxin  125 mcg Oral Daily  . fluticasone  1 spray Each Nare BID  .  furosemide  40 mg Intravenous Q12H  . ipratropium-albuterol  3 mL Nebulization BID  . isosorbide-hydrALAZINE  1 tablet Oral TID  . levofloxacin (LEVAQUIN) IV  750 mg Intravenous Q48H  . loratadine  10 mg Oral Daily  . sodium chloride  3 mL Intravenous Q12H  . Umeclidinium-Vilanterol  62.5 mcg Inhalation Daily      ASSESSMENT AND PLAN:  Acute on chronic respiratory failure with hypoxia - abx and nebs per IM  Active Problems:   Acute on chronic systolic CHF (congestive heart failure) - continue IV Lasix, follow weights, I/O, renal function. Will supp K+, continue BB, BiDil    Chronic kidney disease, stage 3 - per IM, follow    Coronary artery disease involving native coronary artery of native heart without angina pectoris - no ongoing ischemic symptoms, continue BB,  statin, no ASA because of coumadin.     Paroxysmal atrial fibrillation - has at times, device functioning well.    Frequent PVCs - supp K+    Hypoxia - see above    Chronic obstructive pulmonary disease with acute exacerbation - per IM   Signed, Rosaria Ferries , PA-C 8:52 AM 11/22/2013

## 2013-11-22 NOTE — Progress Notes (Addendum)
ANTICOAGULATION CONSULT NOTE - Initial Consult  Pharmacy Consult for coumadin Indication: atrial fibrillation  Allergies  Allergen Reactions  . Benadryl [Diphenhydramine Hcl] Nausea And Vomiting    Patient Measurements: Height: 5\' 5"  (165.1 cm) Weight: 178 lb 12.7 oz (81.1 kg) IBW/kg (Calculated) : 61.5  Vital Signs: Temp: 97.7 F (36.5 C) (11/02 0634) Temp Source: Oral (11/02 0634) BP: 121/58 mmHg (11/02 0634) Pulse Rate: 62 (11/02 0634)  Labs:  Recent Labs  11/21/13 1109  11/21/13 1600 11/21/13 2128 11/22/13 0332  HGB 13.4  --   --   --   --   HCT 39.4  --   --   --   --   PLT 117*  --   --   --   --   LABPROT 33.7*  --   --   --  32.4*  INR 3.29*  --   --   --  3.12*  CREATININE 1.52*  --   --   --  1.41*  TROPONINI  --   < > <0.30 <0.30 <0.30  < > = values in this interval not displayed.  Estimated Creatinine Clearance: 45.7 mL/min (by C-G formula based on Cr of 1.41).   Medical History: Past Medical History  Diagnosis Date  . CAD (coronary artery disease) 1996    status post PCI of the RCA   . Ischemic dilated cardiomyopathy   . HTN (hypertension)   . GERD (gastroesophageal reflux disease)   . Personal history of DVT (deep vein thrombosis)   . Diabetes mellitus, type 2   . DJD (degenerative joint disease)   . Gout   . Prostatitis   . Nephrolithiasis   . Atrial fibrillation   . Congestive heart failure, unspecified   . Other primary cardiomyopathies     Medications:  Prescriptions prior to admission  Medication Sig Dispense Refill Last Dose  . albuterol (ACCUNEB) 1.25 MG/3ML nebulizer solution Take 3 mLs (1.25 mg total) by nebulization every 6 (six) hours as needed for wheezing. 75 mL 3 11/21/2013 at Unknown time  . albuterol (PROVENTIL HFA;VENTOLIN HFA) 108 (90 BASE) MCG/ACT inhaler Inhale 2 puffs into the lungs every 4 (four) hours as needed for wheezing or shortness of breath. 1 Inhaler 11 11/21/2013 at Unknown time  . alfuzosin (UROXATRAL) 10 MG  24 hr tablet Take 10 mg by mouth daily.   11/21/2013 at Unknown time  . allopurinol (ZYLOPRIM) 100 MG tablet Take 100 mg by mouth daily.   11/21/2013 at Unknown time  . atorvastatin (LIPITOR) 10 MG tablet Take 1 tablet (10 mg total) by mouth at bedtime. 90 tablet 3 11/20/2013 at Unknown time  . carvedilol (COREG) 12.5 MG tablet Take 12.5 mg by mouth 2 (two) times daily with a meal.   11/21/2013 at 0700  . digoxin (LANOXIN) 0.125 MG tablet Take 125 mcg by mouth daily.   11/21/2013 at Unknown time  . fluticasone (FLONASE) 50 MCG/ACT nasal spray Place 1 spray into both nostrils 2 (two) times daily.   11/21/2013 at Unknown time  . loratadine (CLARITIN) 10 MG tablet Take 10 mg by mouth daily. For allergies   11/21/2013 at Unknown time  . losartan (COZAAR) 50 MG tablet Take 50 mg by mouth daily.   11/21/2013 at Unknown time  . torsemide (DEMADEX) 20 MG tablet Take 20 mg by mouth 2 (two) times daily.    11/21/2013 at Unknown time  . Umeclidinium-Vilanterol 62.5-25 MCG/INH AEPB Inhale 62.5 mcg into the lungs daily. 30 each 11  11/21/2013 at Unknown time  . warfarin (COUMADIN) 1 MG tablet Take 2-3 mg by mouth See admin instructions. Takes 2 tablets on Monday, Tuesday and then take 3 tablets on all other days   11/20/2013 at Unknown time  . predniSONE (DELTASONE) 20 MG tablet Take 2 tablets (40 mg total) by mouth daily. 10 tablet 0    Scheduled:  . alfuzosin  10 mg Oral Daily  . allopurinol  100 mg Oral Daily  . atorvastatin  10 mg Oral QHS  . carvedilol  12.5 mg Oral BID WC  . digoxin  125 mcg Oral Daily  . fluticasone  1 spray Each Nare BID  . furosemide  40 mg Intravenous Q12H  . ipratropium-albuterol  3 mL Nebulization BID  . isosorbide-hydrALAZINE  1 tablet Oral TID  . loratadine  10 mg Oral Daily  . sodium chloride  3 mL Intravenous Q12H  . Umeclidinium-Vilanterol  62.5 mcg Inhalation Daily    Assessment: 73 yo male here with SOB and here with acute on chronic systolic HF. He is on coumadin PTA for  afib (admit INR= 3.29) and INR trend down to 3.12 today.  Last dose of coumadin 11/20/13  Home coumadin regimen: 3mg /day except 2mg  on MoTu  Goal of Therapy:  INR 2-3 Monitor platelets by anticoagulation protocol: Yes   Plan:   -Coumadin 1mg  po today (antipate INR trend down after hold on 11/21/13) -Daily PT/INR  Hildred Laser, Pharm D 11/22/2013 10:13 AM

## 2013-11-22 NOTE — Progress Notes (Signed)
[facility] PROGRESS NOTE Interim History: 73 y.o. male With h/o ischemic cardiomyopathy, st Jude IVD placement, chronic systolic heart failure on torsemide, copd , CKD stage 1, comes in for worsening sob over the last few days , with persistent productive cough, with hemoptysis today. He reports he saw a small amount of bright red blood for the first time this morning. He reports orthoPnea, no PND, pedal edema present.  Filed Weights   11/21/13 1438 11/22/13 0634  Weight: 82.5 kg (181 lb 14.1 oz) 81.1 kg (178 lb 12.7 oz)        Intake/Output Summary (Last 24 hours) at 11/22/13 0930 Last data filed at 11/22/13 0825  Gross per 24 hour  Intake    870 ml  Output   3595 ml  Net  -2725 ml     Assessment/Plan:  Acute on chronic respiratory failure with hypoxia due to Acute on chronic systolic CHF (congestive heart failure) - EDW around ~ 80 kg. Cont strict I and O's. - On lasix IV, good diuresis. consutled cards. - Monitor electrolytes and replete as needed. - Weight improving with diuresis.  Chronic kidney disease, stage 3: - Cr at baseline 1.3-1.5.  Coronary artery disease involving native coronary artery of native heart without angina pectoris: - asx, cont. - continue BB, statin, no ASA because of coumadin  Paroxysmal atrial fibrillation - Consult cards, pacer interrogated.  Chronic obstructive pulmonary disease withou acute exacerbation - stable cont meds.    Code Status: full Family Communication: none  Disposition Plan: inpatient   Consultants:  cardiology  Procedures: ECHO: none  Antibiotics:  Levaquin 11.1.2015-11.2.2015  HPI/Subjective: Relates SOB is improved. Cont to cough  Objective: Filed Vitals:   11/21/13 2048 11/21/13 2107 11/22/13 0634 11/22/13 0907  BP: 150/69  121/58   Pulse: 73 65 62   Temp: 98.7 F (37.1 C)  97.7 F (36.5 C)   TempSrc: Oral  Oral   Resp: 19 20 17    Height:      Weight:   81.1 kg (178 lb 12.7 oz)   SpO2:  98% 99% 94% 94%     Exam:  General: Alert, awake, oriented x3, in no acute distress.  HEENT: No bruits, no goiter.  Heart: Regular rate and rhythm. -JVD Lungs: Good air movement, no wheezing, no crackles  Abdomen: Soft, nontender, nondistended, positive bowel sounds.    Data Reviewed: Basic Metabolic Panel:  Recent Labs Lab 11/21/13 1109 11/22/13 0332  NA 140 142  K 3.6* 3.6*  CL 100 102  CO2 29 28  GLUCOSE 150* 120*  BUN 21 20  CREATININE 1.52* 1.41*  CALCIUM 8.9 8.2*   Liver Function Tests: No results for input(s): AST, ALT, ALKPHOS, BILITOT, PROT, ALBUMIN in the last 168 hours. No results for input(s): LIPASE, AMYLASE in the last 168 hours. No results for input(s): AMMONIA in the last 168 hours. CBC:  Recent Labs Lab 11/21/13 1109  WBC 7.4  HGB 13.4  HCT 39.4  MCV 96.1  PLT 117*   Cardiac Enzymes:  Recent Labs Lab 11/21/13 1154 11/21/13 1600 11/21/13 2128 11/22/13 0332  TROPONINI <0.30 <0.30 <0.30 <0.30   BNP (last 3 results)  Recent Labs  11/07/13 1114 11/21/13 1109  PROBNP 7451.0* 14338.0*   CBG: No results for input(s): GLUCAP in the last 168 hours.  No results found for this or any previous visit (from the past 240 hour(s)).   Studies: Ct Chest Wo Contrast  11/21/2013   CLINICAL DATA:  Short of  breath for 3 days.  Cough and hemoptysis.  EXAM: CT CHEST WITHOUT CONTRAST  TECHNIQUE: Multidetector CT imaging of the chest was performed following the standard protocol without IV contrast.  COMPARISON:  08/16/2005  FINDINGS: AICD device is in place from the left subclavian vein. Tips of the leads are in the right atrium, right ventricle, and coronary sinus.  No evidence of abnormal mediastinal adenopathy. Small nodes are noted  Small pericardial effusion is re- demonstrated.  No pneumothorax. Small bilateral pleural effusions right greater than left.  Patchy areas of ground-glass are scattered throughout the right lung with a prep collection for  the right middle and upper lobes. 5 mm right upper lobe nodule on image 45. Patchy density at the base of the right middle lobe laterally on image 49 is atelectasis versus airspace disease. Similar confluent opacity in the upper posterior right middle lobe on image 32.  9 mm visceral pleural node in the right minor fissure is stable. Dependent atelectasis at the lung bases bilaterally.  Tiny left posterior basilar Bochdalek's hernia.  No acute vertebral compression deformity. There is kyphosis of the lower thoracic spine which has a chronic appearance.  Low-density lesion at the dome of the liver on image 50 is stable.  There is free fluid in the abdomen within the pericolic gutters.  IMPRESSION: Patchy ground-glass and airspace opacities in the right upper and middle lobes worrisome for an inflammatory process.  Small bilateral pleural effusions per  Small pericardial effusion  AICD device  Stable liver lesions supporting benign etiology.  Small amount of free fluid in the abdomen.  Stable right mm visceral pleural lymph node. New 5 mm right upper lobe pulmonary nodule. If the patient is at high risk for bronchogenic carcinoma, follow-up chest CT at 6-12 months is recommended. If the patient is at low risk for bronchogenic carcinoma, follow-up chest CT at 12 months is recommended. This recommendation follows the consensus statement: Guidelines for Management of Small Pulmonary Nodules Detected on CT Scans: A Statement from the [place] as published in Radiology 2005;237:395-400.   Electronically Signed   By: Maryclare Bean M.D.   On: 11/21/2013 15:04   Dg Chest Port 1 View  11/21/2013   CLINICAL DATA:  Shortness of breath.  EXAM: PORTABLE CHEST - 1 VIEW  COMPARISON:  11/07/2013.  FINDINGS: Mediastinum and hilar structures are normal. Cardiomegaly. Mild pulmonary vascular prominence. Mild interstitial prominence. Partially cleared from prior exam. Left pleural effusion has cleared. Cardiac pacer and lead  tips in right atrium right ventricle. No pneumothorax. No acute bony abnormality.  IMPRESSION: 1. Mild congestive heart failure. Mild interstitial edema has partially cleared from prior exam. Left pleural effusion has cleared. 2. Cardiac pacer with lead tips in right atrium right ventricle.   Electronically Signed   By: [place]   On: 11/21/2013 11:37    Scheduled Meds: . alfuzosin  10 mg Oral Daily  . allopurinol  100 mg Oral Daily  . atorvastatin  10 mg Oral QHS  . carvedilol  12.5 mg Oral BID WC  . digoxin  125 mcg Oral Daily  . fluticasone  1 spray Each Nare BID  . furosemide  40 mg Intravenous Q12H  . ipratropium-albuterol  3 mL Nebulization BID  . isosorbide-hydrALAZINE  1 tablet Oral TID  . levofloxacin (LEVAQUIN) IV  750 mg Intravenous Q48H  . loratadine  10 mg Oral Daily  . sodium chloride  3 mL Intravenous Q12H  . Umeclidinium-Vilanterol  62.5 mcg Inhalation  Daily   Continuous Infusions:    Charlynne Cousins  [facility] Pager [phone] If 8PM-8AM, please contact night-coverage at [email or web address], password [facility] 11/22/2013, 9:30 AM  LOS: 1 day

## 2013-11-22 NOTE — Progress Notes (Signed)
CRT device interrogation shows no VT, very frequent PVCs which reduce BiV pacing efficiency to 82% (comparable to previous evaluation), Frequent but generally brief AFib (longest episode 1h 20 min) is not likely to be cause of decompensation. Thoracic impedance falling for last 6 days or so.

## 2013-11-23 DIAGNOSIS — J9621 Acute and chronic respiratory failure with hypoxia: Secondary | ICD-10-CM

## 2013-11-23 LAB — BASIC METABOLIC PANEL
ANION GAP: 9 (ref 5–15)
BUN: 19 mg/dL (ref 6–23)
CHLORIDE: 99 meq/L (ref 96–112)
CO2: 31 mEq/L (ref 19–32)
CREATININE: 1.32 mg/dL (ref 0.50–1.35)
Calcium: 8.6 mg/dL (ref 8.4–10.5)
GFR calc non Af Amer: 52 mL/min — ABNORMAL LOW (ref >90)
GFR, EST AFRICAN AMERICAN: 60 mL/min — AB (ref >90)
Glucose, Bld: 122 mg/dL — ABNORMAL HIGH (ref 70–99)
Potassium: 3.8 mEq/L (ref 3.7–5.3)
SODIUM: 139 meq/L (ref 137–147)

## 2013-11-23 LAB — PROTIME-INR
INR: 2.77 — ABNORMAL HIGH (ref 0.00–1.49)
Prothrombin Time: 29.5 seconds — ABNORMAL HIGH (ref 11.6–15.2)

## 2013-11-23 MED ORDER — WARFARIN SODIUM 2 MG PO TABS
2.0000 mg | ORAL_TABLET | Freq: Once | ORAL | Status: AC
  Administered 2013-11-23: 2 mg via ORAL
  Filled 2013-11-23: qty 1

## 2013-11-23 MED ORDER — TORSEMIDE 20 MG PO TABS
20.0000 mg | ORAL_TABLET | Freq: Two times a day (BID) | ORAL | Status: DC
  Administered 2013-11-23 – 2013-11-26 (×8): 20 mg via ORAL
  Filled 2013-11-23 (×9): qty 1

## 2013-11-23 MED ORDER — FUROSEMIDE 10 MG/ML IJ SOLN
40.0000 mg | Freq: Once | INTRAMUSCULAR | Status: AC
  Administered 2013-11-23: 40 mg via INTRAVENOUS

## 2013-11-23 NOTE — Progress Notes (Signed)
ANTICOAGULATION CONSULT NOTE - Follow Up Consult  Pharmacy Consult for coumadin Indication: atrial fibrillation  Allergies  Allergen Reactions  . Benadryl [Diphenhydramine Hcl] Nausea And Vomiting    Patient Measurements: Height: 5\' 5"  (165.1 cm) Weight: 177 lb 14.4 oz (80.695 kg) (c scale) IBW/kg (Calculated) : 61.5   Vital Signs: Temp: 98.2 F (36.8 C) (11/03 0625) Temp Source: Oral (11/03 0625) BP: 148/80 mmHg (11/03 0625) Pulse Rate: 71 (11/03 0625)  Labs:  Recent Labs  11/21/13 1109  11/21/13 1600 11/21/13 2128 11/22/13 0332 11/23/13 0552  HGB 13.4  --   --   --   --   --   HCT 39.4  --   --   --   --   --   PLT 117*  --   --   --   --   --   LABPROT 33.7*  --   --   --  32.4* 29.5*  INR 3.29*  --   --   --  3.12* 2.77*  CREATININE 1.52*  --   --   --  1.41* 1.32  TROPONINI  --   < > <0.30 <0.30 <0.30  --   < > = values in this interval not displayed.  Estimated Creatinine Clearance: 48.8 mL/min (by C-G formula based on Cr of 1.32).   Medications:  Scheduled:  . alfuzosin  10 mg Oral Daily  . allopurinol  100 mg Oral Daily  . atorvastatin  10 mg Oral QHS  . carvedilol  12.5 mg Oral BID WC  . digoxin  125 mcg Oral Daily  . fluticasone  1 spray Each Nare BID  . ipratropium-albuterol  3 mL Nebulization BID  . isosorbide-hydrALAZINE  1 tablet Oral TID  . loratadine  10 mg Oral Daily  . sodium chloride  3 mL Intravenous Q12H  . torsemide  20 mg Oral BID  . Umeclidinium-Vilanterol  62.5 mcg Inhalation Daily  . Warfarin - Pharmacist Dosing Inpatient   Does not apply q1800    Assessment: 73 yo male here with SOB and here with acute on chronic systolic HF. He is on coumadin PTA for afib (admit INR= 3.29) and INR trend down to 2.77 today  Home coumadin regimen: 3mg /day except 2mg  on MoTu  Goal of Therapy:  INR 2-3 Monitor platelets by anticoagulation protocol: Yes   Plan:  -Coumadin 2mg  po today -Daily PT/INR  Hildred Laser, Pharm D 11/23/2013 10:50  AM

## 2013-11-23 NOTE — Progress Notes (Addendum)
[facility] PROGRESS NOTE Interim History: 73 y.o. male With h/o ischemic cardiomyopathy, st Jude IVD placement, chronic systolic heart failure on torsemide, copd , CKD stage 1, comes in for worsening sob over the last few days , with persistent productive cough, with hemoptysis today. He reports he saw a small amount of bright red blood for the first time this morning. He reports orthoPnea, no PND, pedal edema present. Cardiology consulted pacer interrogated. Filed Weights   11/21/13 1438 11/22/13 0634 11/23/13 0625  Weight: 82.5 kg (181 lb 14.1 oz) 81.1 kg (178 lb 12.7 oz) 80.695 kg (177 lb 14.4 oz)        Intake/Output Summary (Last 24 hours) at 11/23/13 0836 Last data filed at 11/23/13 0559  Gross per 24 hour  Intake   1140 ml  Output   1230 ml  Net    -90 ml     Assessment/Plan:  Acute on chronic respiratory failure with hypoxia due to Acute on chronic systolic CHF (congestive heart failure) - EDW around ~ 80 kg. Good strict I and O's. - Started on lasix IV, with good diuresis. Transition to oral lasix on 11.3.2015 and monitor for 24 hrs. - Monitor electrolytes and replete as needed. - Weight improving with diuresis.  Chronic kidney disease, stage 3: - Cr at baseline 1.3-1.5.  Coronary artery disease involving native coronary artery of native heart without angina pectoris: - asx, cont. - continue BB, statin, no ASA because of coumadin  Paroxysmal atrial fibrillation - Consult cards, pacer interrogated. - INR therapeutic.  Chronic obstructive pulmonary disease withou acute exacerbation - stable cont meds.    Code Status: full Family Communication: none  Disposition Plan: inpatient   Consultants:  cardiology  Procedures: ECHO: none  Antibiotics:  Levaquin 11.1.2015-11.2.2015  HPI/Subjective: Wants to go home.  Objective: Filed Vitals:   11/22/13 1943 11/22/13 2137 11/23/13 0625 11/23/13 0822  BP:  137/64 148/80   Pulse:  73 71   Temp:   98.2 F (36.8 C) 98.2 F (36.8 C)   TempSrc:  Oral Oral   Resp:  18 18   Height:      Weight:   80.695 kg (177 lb 14.4 oz)   SpO2: 97% 95% 90% 94%     Exam:  General: Alert, awake, oriented x3, in no acute distress.  HEENT: No bruits, no goiter.  Heart: Regular rate and rhythm. -JVD Lungs: Good air movement, no wheezing, no crackles  Abdomen: Soft, nontender, nondistended, positive bowel sounds.    Data Reviewed: Basic Metabolic Panel:  Recent Labs Lab 11/21/13 1109 11/22/13 0332 11/23/13 0552  NA 140 142 139  K 3.6* 3.6* 3.8  CL 100 102 99  CO2 29 28 31   GLUCOSE 150* 120* 122*  BUN 21 20 19   CREATININE 1.52* 1.41* 1.32  CALCIUM 8.9 8.2* 8.6   Liver Function Tests: No results for input(s): AST, ALT, ALKPHOS, BILITOT, PROT, ALBUMIN in the last 168 hours. No results for input(s): LIPASE, AMYLASE in the last 168 hours. No results for input(s): AMMONIA in the last 168 hours. CBC:  Recent Labs Lab 11/21/13 1109  WBC 7.4  HGB 13.4  HCT 39.4  MCV 96.1  PLT 117*   Cardiac Enzymes:  Recent Labs Lab 11/21/13 1154 11/21/13 1600 11/21/13 2128 11/22/13 0332  TROPONINI <0.30 <0.30 <0.30 <0.30   BNP (last 3 results)  Recent Labs  11/07/13 1114 11/21/13 1109  PROBNP 7451.0* 14338.0*   CBG: No results for input(s): GLUCAP in the last  168 hours.  No results found for this or any previous visit (from the past 240 hour(s)).   Studies: Ct Chest Wo Contrast  11/21/2013   CLINICAL DATA:  Short of breath for 3 days.  Cough and hemoptysis.  EXAM: CT CHEST WITHOUT CONTRAST  TECHNIQUE: Multidetector CT imaging of the chest was performed following the standard protocol without IV contrast.  COMPARISON:  08/16/2005  FINDINGS: AICD device is in place from the left subclavian vein. Tips of the leads are in the right atrium, right ventricle, and coronary sinus.  No evidence of abnormal mediastinal adenopathy. Small nodes are noted  Small pericardial effusion is re-  demonstrated.  No pneumothorax. Small bilateral pleural effusions right greater than left.  Patchy areas of ground-glass are scattered throughout the right lung with a prep collection for the right middle and upper lobes. 5 mm right upper lobe nodule on image 45. Patchy density at the base of the right middle lobe laterally on image 49 is atelectasis versus airspace disease. Similar confluent opacity in the upper posterior right middle lobe on image 32.  9 mm visceral pleural node in the right minor fissure is stable. Dependent atelectasis at the lung bases bilaterally.  Tiny left posterior basilar Bochdalek's hernia.  No acute vertebral compression deformity. There is kyphosis of the lower thoracic spine which has a chronic appearance.  Low-density lesion at the dome of the liver on image 50 is stable.  There is free fluid in the abdomen within the pericolic gutters.  IMPRESSION: Patchy ground-glass and airspace opacities in the right upper and middle lobes worrisome for an inflammatory process.  Small bilateral pleural effusions per  Small pericardial effusion  AICD device  Stable liver lesions supporting benign etiology.  Small amount of free fluid in the abdomen.  Stable right mm visceral pleural lymph node. New 5 mm right upper lobe pulmonary nodule. If the patient is at high risk for bronchogenic carcinoma, follow-up chest CT at 6-12 months is recommended. If the patient is at low risk for bronchogenic carcinoma, follow-up chest CT at 12 months is recommended. This recommendation follows the consensus statement: Guidelines for Management of Small Pulmonary Nodules Detected on CT Scans: A Statement from the [place] as published in Radiology 2005;237:395-400.   Electronically Signed   By: Maryclare Bean M.D.   On: 11/21/2013 15:04   Dg Chest Port 1 View  11/21/2013   CLINICAL DATA:  Shortness of breath.  EXAM: PORTABLE CHEST - 1 VIEW  COMPARISON:  11/07/2013.  FINDINGS: Mediastinum and hilar structures  are normal. Cardiomegaly. Mild pulmonary vascular prominence. Mild interstitial prominence. Partially cleared from prior exam. Left pleural effusion has cleared. Cardiac pacer and lead tips in right atrium right ventricle. No pneumothorax. No acute bony abnormality.  IMPRESSION: 1. Mild congestive heart failure. Mild interstitial edema has partially cleared from prior exam. Left pleural effusion has cleared. 2. Cardiac pacer with lead tips in right atrium right ventricle.   Electronically Signed   By: [place]   On: 11/21/2013 11:37    Scheduled Meds: . alfuzosin  10 mg Oral Daily  . allopurinol  100 mg Oral Daily  . atorvastatin  10 mg Oral QHS  . carvedilol  12.5 mg Oral BID WC  . digoxin  125 mcg Oral Daily  . fluticasone  1 spray Each Nare BID  . furosemide  40 mg Intravenous Q12H  . ipratropium-albuterol  3 mL Nebulization BID  . isosorbide-hydrALAZINE  1 tablet Oral TID  .  loratadine  10 mg Oral Daily  . sodium chloride  3 mL Intravenous Q12H  . Umeclidinium-Vilanterol  62.5 mcg Inhalation Daily  . Warfarin - Pharmacist Dosing Inpatient   Does not apply q1800   Continuous Infusions:    Charlynne Cousins  [facility] Pager [phone] If 8PM-8AM, please contact night-coverage at [email or web address], password [facility] 11/23/2013, 8:36 AM  LOS: 2 days

## 2013-11-23 NOTE — Progress Notes (Signed)
Patient Name: Troy [name] Date of Encounter: 11/23/2013  Principal Problem:   Acute on chronic respiratory failure with hypoxia Active Problems:   Acute on chronic systolic CHF (congestive heart failure)   Chronic kidney disease, stage 3   Coronary artery disease involving native coronary artery of native heart without angina pectoris   Paroxysmal atrial fibrillation   Frequent PVCs   Hypoxia   Chronic obstructive pulmonary disease with acute exacerbation   Dyspnea    Patient Profile: 73 yo male w/ hx ICM/NICM (EF 25-30% May 2014), St Jude ICD, Chronic S-CHF, PAF on coum, venous insuff, COPD, admitted 11/01 w/ hypoxia, CHF. Not on home O2.  SUBJECTIVE: Not breathing much better today. Notes O2 sats drop into the 70s with exertion. Admits his respirations were almost this bad for a long time prior to admit.  OBJECTIVE Filed Vitals:   11/22/13 1430 11/22/13 1943 11/22/13 2137 11/23/13 0625  BP: 135/59  137/64 148/80  Pulse: 69  73 71  Temp: 98.8 F (37.1 C)  98.2 F (36.8 C) 98.2 F (36.8 C)  TempSrc: Oral  Oral Oral  Resp: 18  18 18   Height:      Weight:    177 lb 14.4 oz (80.695 kg)  SpO2: 98% 97% 95% 90%    Intake/Output Summary (Last 24 hours) at 11/23/13 0758 Last data filed at 11/23/13 0559  Gross per 24 hour  Intake   1140 ml  Output   1810 ml  Net   -670 ml   Filed Weights   11/21/13 1438 11/22/13 0634 11/23/13 0625  Weight: 181 lb 14.1 oz (82.5 kg) 178 lb 12.7 oz (81.1 kg) 177 lb 14.4 oz (80.695 kg)    PHYSICAL EXAM General: Well developed, well nourished, male in no acute distress. Head: Normocephalic, atraumatic.  Neck: Supple without bruits, JVD 8-9 cm. Lungs:  Resp regular and unlabored, very decreased BS bases, upper lobes w/ rales and slight exp wheeze. Heart: RRR, S1, S2, no S3, S4, or murmur; no rub. Abdomen: Soft, non-tender, non-distended, BS + x 4.  Extremities: No clubbing, cyanosis, no edema.  Neuro: Alert and oriented X 3.  Moves all extremities spontaneously. Psych: Normal affect.  LABS: CBC: Recent Labs  11/21/13 1109  WBC 7.4  HGB 13.4  HCT 39.4  MCV 96.1  PLT 117*   INR: Recent Labs  11/23/13 0552  INR [id number]*   Basic Metabolic Panel: Recent Labs  11/22/13 0332 11/23/13 0552  NA 142 139  K 3.6* 3.8  CL 102 99  CO2 28 31  GLUCOSE 120* 122*  BUN 20 19  CREATININE 1.41* 1.32  CALCIUM 8.2* 8.6   Cardiac Enzymes: Recent Labs  11/21/13 1600 11/21/13 2128 11/22/13 0332  TROPONINI <0.30 <0.30 <0.30    Recent Labs  11/21/13 1138  TROPIPOC 0.09*   BNP: PRO B NATRIURETIC PEPTIDE (BNP)  Date/Time Value Ref Range Status  11/21/2013 11:09 AM 14338.0* 0 - 125 pg/mL Final  11/07/2013 11:14 AM 7451.0* 0 - 125 pg/mL Final   TELE:   No afib 24 hr, PVCs, some NSVT, up to 10 bts, bigeminy at times, BiV pacing +/- A pacing.     Radiology/Studies: Dg Chest Port 1 View 11/21/2013   CLINICAL DATA:  Shortness of breath.  EXAM: PORTABLE CHEST - 1 VIEW  COMPARISON:  11/07/2013.  FINDINGS: Mediastinum and hilar structures are normal. Cardiomegaly. Mild pulmonary vascular prominence. Mild interstitial prominence. Partially cleared from prior exam. Left pleural effusion has  cleared. Cardiac pacer and lead tips in right atrium right ventricle. No pneumothorax. No acute bony abnormality.  IMPRESSION: 1. Mild congestive heart failure. Mild interstitial edema has partially cleared from prior exam. Left pleural effusion has cleared. 2. Cardiac pacer with lead tips in right atrium right ventricle.   Electronically Signed   By: Marcello Moores  Register   On: 11/21/2013 11:37   Current Medications:  . alfuzosin  10 mg Oral Daily  . allopurinol  100 mg Oral Daily  . atorvastatin  10 mg Oral QHS  . carvedilol  12.5 mg Oral BID WC  . digoxin  125 mcg Oral Daily  . fluticasone  1 spray Each Nare BID  . furosemide  40 mg Intravenous Q12H  . ipratropium-albuterol  3 mL Nebulization BID  . isosorbide-hydrALAZINE  1  tablet Oral TID  . loratadine  10 mg Oral Daily  . sodium chloride  3 mL Intravenous Q12H  . Umeclidinium-Vilanterol  62.5 mcg Inhalation Daily  . Warfarin - Pharmacist Dosing Inpatient   Does not apply q1800      ASSESSMENT AND PLAN: Acute on chronic respiratory failure with hypoxia - abx and nebs per IM  Active Problems:  Acute on chronic systolic CHF (congestive heart failure) - continue IV Lasix, follow weights, I/O, renal function. Continue to supp K+, continue BB, BiDil. I/O not strongly negative 11/01, BUN/Cr trending down. Since thoracic impedance was dropping prior to admission, may need to increase Lasix for better diuresis. [name] discuss with MD.     Chronic kidney disease, stage 3 - per IM, follow   Coronary artery disease involving native coronary artery of native heart without angina pectoris - no ongoing ischemic symptoms, continue BB, statin, no ASA because of coumadin.   Paroxysmal atrial fibrillation - has at times, device functioning well. S/p interrogation 11/02 w/ no VT, very frequent PVCs which reduce BiV pacing efficiency to 82% (comparable to previous evaluation), Frequent but generally brief AFib (longest episode 1h 20 min) is not likely to be cause of decompensation. Thoracic impedance falling for last 6 days or so.   Frequent PVCs - supp K+   Hypoxia - see above, per IM   Chronic obstructive pulmonary disease with acute exacerbation - per IM     Abnl Chest CT - New 5 mm right upper lobe pulmonary nodule. If the patient is at high risk for bronchogenic carcinoma, follow-up chest CT at 6-12 months is recommended.  Jonetta Speak , PA-C 7:58 AM 11/23/2013

## 2013-11-23 NOTE — Care Management Note (Addendum)
    Page 1 of 2   11/26/2013     2:24:50 PM CARE MANAGEMENT NOTE 11/26/2013  Patient:  Troy [name], Troy [name]   Account Number:  [id number]  Date Initiated:  11/23/2013  Documentation initiated by:  Emelda Kohlbeck  Subjective/Objective Assessment:   Acute CHF     Action/Plan:   CM to follow for disposition needs   Anticipated DC Date:  11/24/2013   Anticipated DC Plan:  [facility]  CM consult  Other      PAC Choice  DURABLE MEDICAL EQUIPMENT   Choice offered to / List presented to:  C-1 Patient   DME arranged  [facility]      DME agency  [facility]        Status of service:  Completed, signed off Medicare Important Message given?  YES (If response is "NO", the following Medicare IM given date fields will be blank) Date Medicare IM given:  11/23/2013 Medicare IM given by:  Conlan Miceli Date Additional Medicare IM given:  11/26/2013 Additional Medicare IM given by:  Riki Gehring  Discharge Disposition:  HOME/SELF CARE  Per UR Regulation:  Reviewed for med. necessity/level of care/duration of stay  If discussed at [place] of Stay Meetings, dates discussed:    Comments:  Alix Stowers RN, BSN, MSHL, CCM  Nurse - Case Manager,  ([facility] [place])  [phone]  11/26/2013 Home Oxygen ordered with [facility] Provider) [facility] [address] [place], [place], [place] [address] [phone] phone [phone] fax Contact: Jeannene Patella or Lucy Chris RN, BSN, MSHL, CCM  Nurse - Case Manager,  (Unit [phone]  11/23/2013 Social:  From home alone Dispo Plan:  Home / Self care.

## 2013-11-23 NOTE — Progress Notes (Signed)
UR completed Caytlin Better K. Yianni Skilling, RN, BSN, MSHL, CCM  11/23/2013 1:56 PM

## 2013-11-24 DIAGNOSIS — R042 Hemoptysis: Secondary | ICD-10-CM | POA: Insufficient documentation

## 2013-11-24 LAB — PROTIME-INR
INR: 2.79 — ABNORMAL HIGH (ref 0.00–1.49)
PROTHROMBIN TIME: 29.6 s — AB (ref 11.6–15.2)

## 2013-11-24 LAB — BASIC METABOLIC PANEL
Anion gap: 7 (ref 5–15)
BUN: 22 mg/dL (ref 6–23)
CO2: 34 mEq/L — ABNORMAL HIGH (ref 19–32)
Calcium: 8.7 mg/dL (ref 8.4–10.5)
Chloride: 100 mEq/L (ref 96–112)
Creatinine, Ser: 1.6 mg/dL — ABNORMAL HIGH (ref 0.50–1.35)
GFR calc Af Amer: 48 mL/min — ABNORMAL LOW (ref >90)
GFR, EST NON AFRICAN AMERICAN: 41 mL/min — AB (ref >90)
GLUCOSE: 116 mg/dL — AB (ref 70–99)
POTASSIUM: 3.7 meq/L (ref 3.7–5.3)
SODIUM: 141 meq/L (ref 137–147)

## 2013-11-24 MED ORDER — WARFARIN SODIUM 2 MG PO TABS
2.0000 mg | ORAL_TABLET | Freq: Once | ORAL | Status: AC
  Administered 2013-11-24: 2 mg via ORAL
  Filled 2013-11-24: qty 1

## 2013-11-24 MED ORDER — ZOLPIDEM TARTRATE 5 MG PO TABS: 5.0000 mg | ORAL_TABLET | Freq: Once | ORAL | Status: DC

## 2013-11-24 NOTE — Progress Notes (Signed)
[facility] PROGRESS NOTE Interim History: 73 y.o. male With h/o ischemic cardiomyopathy, st Jude IVD placement, chronic systolic heart failure on torsemide, copd , CKD stage 1, comes in for worsening sob over the last few days , with persistent productive cough, with hemoptysis today. He reports he saw a small amount of bright red blood for the first time this morning. He reports orthoPnea, no PND, pedal edema present. Cardiology consulted pacer interrogated. Filed Weights   11/22/13 0634 11/23/13 0625 11/24/13 0509  Weight: 81.1 kg (178 lb 12.7 oz) 80.695 kg (177 lb 14.4 oz) 79.606 kg (175 lb 8 oz)        Intake/Output Summary (Last 24 hours) at 11/24/13 1222 Last data filed at 11/24/13 0900  Gross per 24 hour  Intake    720 ml  Output   1325 ml  Net   -605 ml     Assessment/Plan:  Acute on chronic respiratory failure with hypoxia due to Acute on chronic systolic CHF (congestive heart failure) - EDW around ~ 80 kg. Good strict I and O's. - Started on lasix IV, with good diuresis. Transition to oral lasix on 11.3.2015  - Monitor electrolytes and replete as needed. - Weight improving with diuresis.  Chronic kidney disease, stage 3: - Cr at baseline 1.3-1.5.  Coronary artery disease involving native coronary artery of native heart without angina pectoris: - asx, cont. - continue BB, statin, no ASA because of coumadin  Paroxysmal atrial fibrillation - Consult cards, pacer interrogated. - INR therapeutic.  Chronic obstructive pulmonary disease withou acute exacerbation - nebs -will do home O2 eval -per pulm: saw 7/9: very severe AFL    Code Status: full Family Communication: none  Disposition Plan: inpatient   Consultants:  cardiology  Procedures: ECHO: none  Antibiotics:  Levaquin 11.1.2015-11.2.2015  HPI/Subjective: Breathing improved but not back to baseline  Objective: Filed Vitals:   11/24/13 0509 11/24/13 0520 11/24/13 0912 11/24/13 0920   BP: 103/61   129/58  Pulse: 66  71   Temp: 97.7 F (36.5 C)   98.2 F (36.8 C)  TempSrc: Oral   Oral  Resp: 16  22   Height:      Weight: 79.606 kg (175 lb 8 oz)     SpO2: 90% 92% 87% 98%     Exam:  General: Alert, awake, oriented x3, in no acute distress.  HEENT: No bruits, no goiter.  Heart: Regular rate and rhythm. -JVD Lungs: not moving much air  Abdomen: Soft, nontender, nondistended, positive bowel sounds.    Data Reviewed: Basic Metabolic Panel:  Recent Labs Lab 11/21/13 1109 11/22/13 0332 11/23/13 0552 11/24/13 0442  NA 140 142 139 141  K 3.6* 3.6* 3.8 3.7  CL 100 102 99 100  CO2 29 28 31  34*  GLUCOSE 150* 120* 122* 116*  BUN 21 20 19 22   CREATININE 1.52* 1.41* 1.32 1.60*  CALCIUM 8.9 8.2* 8.6 8.7   Liver Function Tests: No results for input(s): AST, ALT, ALKPHOS, BILITOT, PROT, ALBUMIN in the last 168 hours. No results for input(s): LIPASE, AMYLASE in the last 168 hours. No results for input(s): AMMONIA in the last 168 hours. CBC:  Recent Labs Lab 11/21/13 1109  WBC 7.4  HGB 13.4  HCT 39.4  MCV 96.1  PLT 117*   Cardiac Enzymes:  Recent Labs Lab 11/21/13 1154 11/21/13 1600 11/21/13 2128 11/22/13 0332  TROPONINI <0.30 <0.30 <0.30 <0.30   BNP (last 3 results)  Recent Labs  11/07/13 1114 11/21/13  1109  PROBNP 7451.0* 14338.0*   CBG: No results for input(s): GLUCAP in the last 168 hours.  No results found for this or any previous visit (from the past 240 hour(s)).   Studies: No results found.  Scheduled Meds: . alfuzosin  10 mg Oral Daily  . allopurinol  100 mg Oral Daily  . atorvastatin  10 mg Oral QHS  . carvedilol  12.5 mg Oral BID WC  . digoxin  125 mcg Oral Daily  . fluticasone  1 spray Each Nare BID  . ipratropium-albuterol  3 mL Nebulization BID  . isosorbide-hydrALAZINE  1 tablet Oral TID  . loratadine  10 mg Oral Daily  . sodium chloride  3 mL Intravenous Q12H  . torsemide  20 mg Oral BID  . warfarin  2 mg  Oral ONCE-1800  . Warfarin - Pharmacist Dosing Inpatient   Does not apply q1800   Continuous Infusions:    Eulogio Bear  [facility] Pager [phone] If 8PM-8AM, please contact night-coverage at [email or web address], password [facility] 11/24/2013, 12:22 PM  LOS: 3 days

## 2013-11-24 NOTE — Progress Notes (Signed)
Patient Name: Troy [name] Date of Encounter: 11/24/2013  Principal Problem:   Acute on chronic respiratory failure with hypoxia Active Problems:   Acute on chronic systolic CHF (congestive heart failure)   Chronic kidney disease, stage 3   Coronary artery disease involving native coronary artery of native heart without angina pectoris   Paroxysmal atrial fibrillation   Frequent PVCs   Hypoxia   Chronic obstructive pulmonary disease with acute exacerbation   Dyspnea    Patient Profile: 73 yo male w/ hx ICM/NICM (EF 25-30% May 2014), St Jude ICD, Chronic S-CHF, PAF on coum, venous insuff, COPD, admitted 11/01 w/ hypoxia, CHF. Not on home O2.  SUBJECTIVE: Still having shortness of breath, and says it gets worse when he walks to the restroom. Says his O2 levels dropped in the 80's while receiving his breathing treatment this morning. Says he is having a productive cough with clear/light-yellow mucus. Denies any chest pain, palpitations, or chest pressure.  OBJECTIVE Filed Vitals:   11/23/13 2110 11/23/13 2145 11/24/13 0509 11/24/13 0520  BP:  122/56 103/61   Pulse:   66   Temp:   97.7 F (36.5 C)   TempSrc:   Oral   Resp:   16   Height:      Weight:   175 lb 8 oz (79.606 kg)   SpO2: 98%  90% 92%    Intake/Output Summary (Last 24 hours) at 11/24/13 0843 Last data filed at 11/24/13 0400  Gross per 24 hour  Intake    720 ml  Output   1525 ml  Net   -805 ml   Filed Weights   11/22/13 0634 11/23/13 0625 11/24/13 0509  Weight: 178 lb 12.7 oz (81.1 kg) 177 lb 14.4 oz (80.695 kg) 175 lb 8 oz (79.606 kg)    PHYSICAL EXAM General: Well developed, well nourished, male in no acute distress. Head: Normocephalic, atraumatic.  Neck: Supple without bruits, JVD elevated to 8cm. Lungs:  Resp regular and unlabored, expiratory wheezing present on auscultation. Decreased breath sounds at bases bilaterally. Heart: RRR, S1, S2, no S3, S4, or murmur; no rub. Abdomen: Soft,  non-tender, non-distended, BS + x 4.  Extremities: No clubbing, no cyanosis, no edema.  Neuro: Alert and oriented X 3. Moves all extremities spontaneously. Psych: Normal affect.  LABS: CBC: Recent Labs  11/21/13 1109  WBC 7.4  HGB 13.4  HCT 39.4  MCV 96.1  PLT 117*   INR: Recent Labs  11/24/13 0442  INR 99991111*   Basic Metabolic Panel: Recent Labs  11/23/13 0552 11/24/13 0442  NA 139 141  K 3.8 3.7  CL 99 100  CO2 31 34*  GLUCOSE 122* 116*  BUN 19 22  CREATININE 1.32 1.60*  CALCIUM 8.6 8.7   Cardiac Enzymes: Recent Labs  11/21/13 1600 11/21/13 2128 11/22/13 0332  TROPONINI <0.30 <0.30 <0.30    Recent Labs  11/21/13 1138  TROPIPOC 0.09*   BNP: PRO B NATRIURETIC PEPTIDE (BNP)  Date/Time Value Ref Range Status  11/21/2013 11:09 AM 14338.0* 0 - 125 pg/mL Final  11/07/2013 11:14 AM 7451.0* 0 - 125 pg/mL Final   TELE:   AV pacing, Several PVC's, No Atrial Fibrillation noted over past 24 hours.        Current Medications:  . alfuzosin  10 mg Oral Daily  . allopurinol  100 mg Oral Daily  . atorvastatin  10 mg Oral QHS  . carvedilol  12.5 mg Oral BID WC  . digoxin  125 mcg Oral Daily  . fluticasone  1 spray Each Nare BID  . ipratropium-albuterol  3 mL Nebulization BID  . isosorbide-hydrALAZINE  1 tablet Oral TID  . loratadine  10 mg Oral Daily  . sodium chloride  3 mL Intravenous Q12H  . torsemide  20 mg Oral BID  . Warfarin - Pharmacist Dosing Inpatient   Does not apply q1800    ASSESSMENT AND PLAN:  1. Acute on chronic systolic CHF  -  - Echo on 11/22/2013 showed EF of 20-25% with diffuse hypokinesis. Weight 181 lbs on admission, 175 lbs on 11/24/2013. Net I/O since admission is -3.6L. - Switched to oral torsemide today, 20mg  PO BID, home dose - Continue potassium supplementation as needed. - Continue Coreg 12.5mg  BID - Continue BiDil 20-37.5 mg TID.   2. Chronic kidney disease, stage 3 - per IM, follow - Creatinine at 1.6 on 11/24/2013. This is  at/close to his baseline  3. Coronary artery disease involving native coronary artery of native heart without angina pectoris  - no ongoing ischemic symptoms - Continue Coreg 12.5 mg BID - Continue Atorvastatin 10mg  daily.  4. Paroxysmal atrial fibrillation  -S/p device interrogation 11/22/2013 w/ no VT, very frequent PVCs which reduce BiV pacing efficiency to 82% (comparable to previous evaluation)  5. Frequent PVCs - supp K+ to get it closer to 4.0, ck Mg in am  6. Hypoxia - per IM  7. Chronic obstructive pulmonary disease with acute exacerbation - per IM, not on home O2, currently requiring 5 LPM at rest. May need pulmonary consult.  8. Abnl Chest CT - New 5 mm right upper lobe pulmonary nodule. If the patient is at high risk for bronchogenic carcinoma, follow-up chest CT at 6-12 months is recommended.   Signed, Patient seen and examined with PA-S Dineen Kid. Changes made where appropriate.  Rosaria Ferries , PA-C 8:43 AM 11/24/2013

## 2013-11-24 NOTE — Progress Notes (Signed)
Pharmacist Heart Failure Core Measure Documentation  Assessment: Troy [name] has an EF documented as 20-25% on 11/22/13 by Echo.  Rationale: Heart failure patients with left ventricular systolic dysfunction (LVSD) and an EF < 40% should be prescribed an angiotensin converting enzyme inhibitor (ACEI) or angiotensin receptor blocker (ARB) at discharge unless a contraindication is documented in the medical record.  This patient is not currently on an ACEI or ARB for HF.  This note is being placed in the record in order to provide documentation that a contraindication to the use of these agents is present for this encounter.  ACE Inhibitor or Angiotensin Receptor Blocker is contraindicated (specify all that apply)  []   ACEI allergy AND ARB allergy []   Angioedema []   Moderate or severe aortic stenosis []   Hyperkalemia []   Hypotension []   Renal artery stenosis [x]   Worsening renal function, preexisting renal disease or dysfunction  Hildred Laser, Pharm D 11/24/2013 10:43 AM

## 2013-11-24 NOTE — Progress Notes (Signed)
ANTICOAGULATION CONSULT NOTE - Follow Up Consult  Pharmacy Consult for coumadin Indication: atrial fibrillation  Allergies  Allergen Reactions  . Benadryl [Diphenhydramine Hcl] Nausea And Vomiting    Patient Measurements: Height: 5\' 5"  (165.1 cm) Weight: 175 lb 8 oz (79.606 kg) IBW/kg (Calculated) : 61.5   Vital Signs: Temp: 98.2 F (36.8 C) (11/04 0920) Temp Source: Oral (11/04 0920) BP: 129/58 mmHg (11/04 0920) Pulse Rate: 71 (11/04 0912)  Labs:  Recent Labs  11/21/13 1109  11/21/13 1600 11/21/13 2128 11/22/13 0332 11/23/13 0552 11/24/13 0442  HGB 13.4  --   --   --   --   --   --   HCT 39.4  --   --   --   --   --   --   PLT 117*  --   --   --   --   --   --   LABPROT 33.7*  --   --   --  32.4* 29.5* 29.6*  INR 3.29*  --   --   --  3.12* 2.77* 2.79*  CREATININE 1.52*  --   --   --  1.41* 1.32 1.60*  TROPONINI  --   < > <0.30 <0.30 <0.30  --   --   < > = values in this interval not displayed.  Estimated Creatinine Clearance: 40 mL/min (by C-G formula based on Cr of 1.6).   Medications:  Scheduled:  . alfuzosin  10 mg Oral Daily  . allopurinol  100 mg Oral Daily  . atorvastatin  10 mg Oral QHS  . carvedilol  12.5 mg Oral BID WC  . digoxin  125 mcg Oral Daily  . fluticasone  1 spray Each Nare BID  . ipratropium-albuterol  3 mL Nebulization BID  . isosorbide-hydrALAZINE  1 tablet Oral TID  . loratadine  10 mg Oral Daily  . sodium chloride  3 mL Intravenous Q12H  . torsemide  20 mg Oral BID  . Warfarin - Pharmacist Dosing Inpatient   Does not apply q1800    Assessment: 73 yo male here with SOB and here with acute on chronic systolic HF. He is on coumadin PTA for afib (admit INR= 3.29) and INR 2.79 today  Home coumadin regimen: 3mg /day except 2mg  on MoTu  Goal of Therapy:  INR 2-3 Monitor platelets by anticoagulation protocol: Yes   Plan:  -Coumadin 2mg  po today -Daily PT/INR  Hildred Laser, Pharm D 11/24/2013 10:41 AM

## 2013-11-25 ENCOUNTER — Ambulatory Visit: Payer: Commercial Managed Care - HMO | Admitting: Emergency Medicine

## 2013-11-25 LAB — BASIC METABOLIC PANEL
Anion gap: 12 (ref 5–15)
BUN: 22 mg/dL (ref 6–23)
CO2: 31 mEq/L (ref 19–32)
Calcium: 9.2 mg/dL (ref 8.4–10.5)
Chloride: 100 mEq/L (ref 96–112)
Creatinine, Ser: 1.36 mg/dL — ABNORMAL HIGH (ref 0.50–1.35)
GFR, EST AFRICAN AMERICAN: 58 mL/min — AB (ref >90)
GFR, EST NON AFRICAN AMERICAN: 50 mL/min — AB (ref >90)
Glucose, Bld: 108 mg/dL — ABNORMAL HIGH (ref 70–99)
POTASSIUM: 3.8 meq/L (ref 3.7–5.3)
SODIUM: 143 meq/L (ref 137–147)

## 2013-11-25 LAB — PROTIME-INR
INR: 2.58 — ABNORMAL HIGH (ref 0.00–1.49)
PROTHROMBIN TIME: 27.9 s — AB (ref 11.6–15.2)

## 2013-11-25 LAB — MAGNESIUM: MAGNESIUM: 1.8 mg/dL (ref 1.5–2.5)

## 2013-11-25 MED ORDER — WARFARIN SODIUM 2 MG PO TABS
2.0000 mg | ORAL_TABLET | Freq: Once | ORAL | Status: AC
  Administered 2013-11-25: 2 mg via ORAL
  Filled 2013-11-25: qty 1

## 2013-11-25 MED ORDER — MOMETASONE FURO-FORMOTEROL FUM 100-5 MCG/ACT IN AERO
2.0000 | INHALATION_SPRAY | Freq: Two times a day (BID) | RESPIRATORY_TRACT | Status: DC
  Administered 2013-11-25 – 2013-11-26 (×3): 2 via RESPIRATORY_TRACT
  Filled 2013-11-25: qty 8.8

## 2013-11-25 MED ORDER — PREDNISONE 20 MG PO TABS
40.0000 mg | ORAL_TABLET | Freq: Once | ORAL | Status: AC
  Administered 2013-11-25: 40 mg via ORAL
  Filled 2013-11-25: qty 2

## 2013-11-25 NOTE — Progress Notes (Signed)
Patient's oxygen down to 85% on RA. Placed patient on 2.5 L of O2 with an oxygen saturation of 93%.

## 2013-11-25 NOTE — Progress Notes (Signed)
ANTICOAGULATION CONSULT NOTE - Follow Up Consult  Pharmacy Consult for coumadin Indication: atrial fibrillation  Allergies  Allergen Reactions  . Benadryl [Diphenhydramine Hcl] Nausea And Vomiting    Patient Measurements: Height: 5\' 5"  (165.1 cm) Weight: 174 lb 9.7 oz (79.2 kg) IBW/kg (Calculated) : 61.5   Vital Signs: Temp: 98.7 F (37.1 C) (11/05 0541) Temp Source: Oral (11/05 0541) BP: 148/52 mmHg (11/05 0541) Pulse Rate: 65 (11/05 0541)  Labs:  Recent Labs  11/23/13 0552 11/24/13 0442 11/25/13 0520  LABPROT 29.5* 29.6* 27.9*  INR 2.77* 2.79* 2.58*  CREATININE 1.32 1.60* 1.36*    Estimated Creatinine Clearance: 46.9 mL/min (by C-G formula based on Cr of 1.36).   Medications:  Scheduled:  . alfuzosin  10 mg Oral Daily  . allopurinol  100 mg Oral Daily  . atorvastatin  10 mg Oral QHS  . carvedilol  12.5 mg Oral BID WC  . digoxin  125 mcg Oral Daily  . fluticasone  1 spray Each Nare BID  . ipratropium-albuterol  3 mL Nebulization BID  . isosorbide-hydrALAZINE  1 tablet Oral TID  . loratadine  10 mg Oral Daily  . mometasone-formoterol  2 puff Inhalation BID  . sodium chloride  3 mL Intravenous Q12H  . torsemide  20 mg Oral BID  . warfarin  2 mg Oral ONCE-1800  . Warfarin - Pharmacist Dosing Inpatient   Does not apply q1800  . zolpidem  5 mg Oral Once    Assessment: 73 yo male here with SOB and here with acute on chronic systolic HF. He is on coumadin PTA for afib (admit INR= 3.29) and INR 2.58 today. No bleeding noted, no new CBC.  Home coumadin regimen: 3mg /day except 2mg  on MoTu  Goal of Therapy:  INR 2-3 Monitor platelets by anticoagulation protocol: Yes   Plan:  1. Repeat coumadin 2mg  PO x 1 tonight 2. F/u AM INR  Salome Arnt, PharmD, BCPS Pager # [phone] 11/25/2013 12:52 PM

## 2013-11-25 NOTE — Progress Notes (Signed)
Patient Name: DEKE RENCH Date of Encounter: 11/25/2013  Principal Problem:   Acute on chronic respiratory failure with hypoxia Active Problems:   Acute on chronic systolic CHF (congestive heart failure)   Chronic kidney disease, stage 3   Coronary artery disease involving native coronary artery of native heart without angina pectoris   Paroxysmal atrial fibrillation   Frequent PVCs   Hypoxia   Chronic obstructive pulmonary disease with acute exacerbation   Dyspnea   Hemoptysis    Patient Profile: 73 yo male w/ hx ICM/NICM (EF 25-30% May 2014), St Jude ICD, Chronic S-CHF, PAF on coum, venous insuff, COPD, admitted 11/01 w/ hypoxia, CHF. Not on home O2.  SUBJECTIVE: Breathing has improved significantly.  Now on oral torsemide.  OBJECTIVE Filed Vitals:   11/24/13 1706 11/24/13 2040 11/24/13 2057 11/25/13 0541  BP: 143/68 131/57  148/52  Pulse: 77 72 69 65  Temp:  98.2 F (36.8 C)  98.7 F (37.1 C)  TempSrc:  Oral  Oral  Resp:  19 20 18   Height:      Weight:    174 lb 9.7 oz (79.2 kg)  SpO2: 96% 97% 97% 97%    Intake/Output Summary (Last 24 hours) at 11/25/13 1125 Last data filed at 11/25/13 1054  Gross per 24 hour  Intake   1443 ml  Output   2265 ml  Net   -822 ml   Filed Weights   11/23/13 0625 11/24/13 0509 11/25/13 0541  Weight: 177 lb 14.4 oz (80.695 kg) 175 lb 8 oz (79.606 kg) 174 lb 9.7 oz (79.2 kg)    PHYSICAL EXAM General: Well developed, well nourished, male in no acute distress. Head: Normocephalic, atraumatic.  Neck: Supple without bruits, JVD at 2 cm Lungs:  Resp regular and unlabored, expiratory wheezing present on auscultation. Decreased breath sounds at bases bilaterally. Heart: RRR, S1, S2, no S3, S4, or murmur; no rub. Abdomen: Soft, non-tender, non-distended, BS + x 4.  Extremities: No clubbing, no cyanosis, no edema.  Neuro: Alert and oriented X 3. Moves all extremities spontaneously. Psych: Normal affect.  LABS: CBC:No  results for input(s): WBC, NEUTROABS, HGB, HCT, MCV, PLT in the last 72 hours. INR:  Recent Labs  11/25/13 0520  INR [id number]*   Basic Metabolic Panel:  Recent Labs  11/24/13 0442 11/25/13 0520  NA 141 143  K 3.7 3.8  CL 100 100  CO2 34* 31  GLUCOSE 116* 108*  BUN 22 22  CREATININE 1.60* 1.36*  CALCIUM 8.7 9.2  MG  --  1.8   Cardiac Enzymes:No results for input(s): CKTOTAL, CKMB, CKMBINDEX, TROPONINI in the last 72 hours. No results for input(s): TROPIPOC in the last 72 hours. BNP: PRO B NATRIURETIC PEPTIDE (BNP)  Date/Time Value Ref Range Status  11/21/2013 11:09 AM 14338.0* 0 - 125 pg/mL Final  11/07/2013 11:14 AM 7451.0* 0 - 125 pg/mL Final   TELE:   AV pacing, Several PVC's, No Atrial Fibrillation noted over past 24 hours.        Current Medications:  . alfuzosin  10 mg Oral Daily  . allopurinol  100 mg Oral Daily  . atorvastatin  10 mg Oral QHS  . carvedilol  12.5 mg Oral BID WC  . digoxin  125 mcg Oral Daily  . fluticasone  1 spray Each Nare BID  . ipratropium-albuterol  3 mL Nebulization BID  . isosorbide-hydrALAZINE  1 tablet Oral TID  . loratadine  10 mg Oral Daily  .  mometasone-formoterol  2 puff Inhalation BID  . sodium chloride  3 mL Intravenous Q12H  . torsemide  20 mg Oral BID  . Warfarin - Pharmacist Dosing Inpatient   Does not apply q1800  . zolpidem  5 mg Oral Once    ASSESSMENT AND PLAN:  1. Acute on chronic systolic CHF  -  - Echo on 11/22/2013 showed EF of 20-25% with diffuse hypokinesis. Weight 181 lbs on admission, 175 lbs on 11/24/2013. Net I/O since admission is -4.6L. - Stable on oral torsemide - Continue potassium supplementation as needed. - Continue Coreg 12.5mg  BID - Continue BiDil 20-37.5 mg TID.   2. Chronic kidney disease, stage 3 - per IM, follow - Creatinine at 1.6 on 11/24/2013. This is at/close to his baseline  3. Coronary artery disease involving native coronary artery of native heart without angina pectoris  - no ongoing  ischemic symptoms - Continue Coreg 12.5 mg BID - Continue Atorvastatin 10mg  daily.  4. Paroxysmal atrial fibrillation  -S/p device interrogation 11/22/2013 w/ no VT, very frequent PVCs which reduce BiV pacing efficiency to 82% (comparable to previous evaluation)  5. Frequent PVCs - supp K+ to get it closer to 4.0, ck Mg in am  6. Hypoxia - per IM  7. Chronic obstructive pulmonary disease with acute exacerbation - less oxygen requirement. May not need home O2.   8. Dispo - close to discharge from our standpoint. Stable on CHF meds.   Pixie Casino, MD, [facility] Attending Cardiologist [facility]  HILTY,Kenneth C 11:25 AM 11/25/2013

## 2013-11-25 NOTE — Progress Notes (Signed)
[facility] PROGRESS NOTE  Troy [name] [id number] DOB: [birth year] DOA: 11/21/2013 PCP: Cloyd Stagers, MD  Assessment/Plan:  73 y/o male with PMH of CHF, COPD, paroxysmal atrial fibrillation, s/p CRT-D (ST. Jude), PAF presented with acute hypoxia   1. Acute on chronic respiratory failure with hypoxia due to Acute on chronic systolic CHF (congestive heart failure)+COPD -hypoxia resolved on IV diuretics, but still SOB -cont diuresis, bronchodilators, oxygen; desat study prior to d/c   2. Chronic obstructive pulmonary disease with acute exacerbation; severe COPD -cont bronchodilators, oxygen, add Advair; prednisone/with taper; afebrile; no leukocytosis  -CT: ? 5 mm right upper lobe pulmonary nodule; recommended to f/u with  Repeat CT as outpatient 3-6 month  3. Coronary artery disease involving native coronary artery of native heart without angina pectoris: - asx, cont. continue BB, statin, no ASA because of coumadin 4. Paroxysmal atrial fibrillation - Consult cards, pacer interrogated. Cont coumadin  5. Acute on chronic CHF, systolic HF; echo: LVEF 123XX123 % -cont diuretics; changed to PO; cont BB, dig; ntg/hydralazine   Code Status: full Family Communication:  D/w patient (indicate person spoken with, relationship, and if by phone, the number) Disposition Plan: home 24-48 hrs    Consultants:  Cardiology   Procedures:  none  Antibiotics:  none (indicate start date, and stop date if known)  HPI/Subjective: alert  Objective: Filed Vitals:   11/25/13 0541  BP: 148/52  Pulse: 65  Temp: 98.7 F (37.1 C)  Resp: 18    Intake/Output Summary (Last 24 hours) at 11/25/13 1010 Last data filed at 11/25/13 1000  Gross per 24 hour  Intake   1383 ml  Output   2065 ml  Net   -682 ml   Filed Weights   11/23/13 0625 11/24/13 0509 11/25/13 0541  Weight: 80.695 kg (177 lb 14.4 oz) 79.606 kg (175 lb 8 oz) 79.2 kg (174 lb 9.7 oz)     Exam:   General:  alert  Cardiovascular: s1,s2 rrr  Respiratory: BL crackles LL; poor ventilation   Abdomen: soft, nt,nd   Musculoskeletal: no LE edema   Data Reviewed: Basic Metabolic Panel:  Recent Labs Lab 11/21/13 1109 11/22/13 0332 11/23/13 0552 11/24/13 0442 11/25/13 0520  NA 140 142 139 141 143  K 3.6* 3.6* 3.8 3.7 3.8  CL 100 102 99 100 100  CO2 29 28 31  34* 31  GLUCOSE 150* 120* 122* 116* 108*  BUN 21 20 19 22 22   CREATININE 1.52* 1.41* 1.32 1.60* 1.36*  CALCIUM 8.9 8.2* 8.6 8.7 9.2  MG  --   --   --   --  1.8   Liver Function Tests: No results for input(s): AST, ALT, ALKPHOS, BILITOT, PROT, ALBUMIN in the last 168 hours. No results for input(s): LIPASE, AMYLASE in the last 168 hours. No results for input(s): AMMONIA in the last 168 hours. CBC:  Recent Labs Lab 11/21/13 1109  WBC 7.4  HGB 13.4  HCT 39.4  MCV 96.1  PLT 117*   Cardiac Enzymes:  Recent Labs Lab 11/21/13 1154 11/21/13 1600 11/21/13 2128 11/22/13 0332  TROPONINI <0.30 <0.30 <0.30 <0.30   BNP (last 3 results)  Recent Labs  11/07/13 1114 11/21/13 1109  PROBNP 7451.0* 14338.0*   CBG: No results for input(s): GLUCAP in the last 168 hours.  No results found for this or any previous visit (from the past 240 hour(s)).   Studies: No results found.  Scheduled Meds: . alfuzosin  10 mg Oral Daily  . allopurinol  100 mg Oral Daily  . atorvastatin  10 mg Oral QHS  . carvedilol  12.5 mg Oral BID WC  . digoxin  125 mcg Oral Daily  . fluticasone  1 spray Each Nare BID  . ipratropium-albuterol  3 mL Nebulization BID  . isosorbide-hydrALAZINE  1 tablet Oral TID  . loratadine  10 mg Oral Daily  . sodium chloride  3 mL Intravenous Q12H  . torsemide  20 mg Oral BID  . Warfarin - Pharmacist Dosing Inpatient   Does not apply q1800  . zolpidem  5 mg Oral Once   Continuous Infusions:   Principal Problem:   Acute on chronic respiratory failure with hypoxia Active  Problems:   Acute on chronic systolic CHF (congestive heart failure)   Chronic kidney disease, stage 3   Coronary artery disease involving native coronary artery of native heart without angina pectoris   Paroxysmal atrial fibrillation   Frequent PVCs   Hypoxia   Chronic obstructive pulmonary disease with acute exacerbation   Dyspnea   Hemoptysis    Time spent: >35 minutes     Kinnie Feil  [facility] Pager [phone]. If 7PM-7AM, please contact night-coverage at [email or web address], password [facility] 11/25/2013, 10:10 AM  LOS: 4 days

## 2013-11-26 LAB — PROTIME-INR
INR: 2.54 — ABNORMAL HIGH (ref 0.00–1.49)
PROTHROMBIN TIME: 27.6 s — AB (ref 11.6–15.2)

## 2013-11-26 MED ORDER — ISOSORB DINITRATE-HYDRALAZINE 20-37.5 MG PO TABS: 1.0000 | ORAL_TABLET | Freq: Three times a day (TID) | ORAL | Status: DC

## 2013-11-26 MED ORDER — CARVEDILOL 12.5 MG PO TABS: 12.5000 mg | ORAL_TABLET | Freq: Two times a day (BID) | ORAL | Status: DC

## 2013-11-26 MED ORDER — MOMETASONE FURO-FORMOTEROL FUM 100-5 MCG/ACT IN AERO: 2.0000 | INHALATION_SPRAY | Freq: Two times a day (BID) | RESPIRATORY_TRACT | Status: DC

## 2013-11-26 MED ORDER — DIGOXIN 125 MCG PO TABS
125.0000 ug | ORAL_TABLET | Freq: Every day | ORAL | Status: DC
Start: 2013-11-26 — End: 2015-01-20

## 2013-11-26 MED ORDER — GUAIFENESIN-DM 100-10 MG/5ML PO SYRP: 5.0000 mL | ORAL_SOLUTION | ORAL | Status: DC | PRN

## 2013-11-26 MED ORDER — TORSEMIDE 20 MG PO TABS: 20.0000 mg | ORAL_TABLET | Freq: Two times a day (BID) | ORAL | Status: DC

## 2013-11-26 MED ORDER — PREDNISONE 10 MG PO TABS: 10.0000 mg | ORAL_TABLET | Freq: Every day | ORAL | Status: DC

## 2013-11-26 MED ORDER — WARFARIN SODIUM 2 MG PO TABS
2.0000 mg | ORAL_TABLET | Freq: Once | ORAL | Status: AC
  Administered 2013-11-26: 2 mg via ORAL
  Filled 2013-11-26: qty 1

## 2013-11-26 MED ORDER — ALBUTEROL SULFATE HFA 108 (90 BASE) MCG/ACT IN AERS: 2.0000 | INHALATION_SPRAY | RESPIRATORY_TRACT | Status: DC | PRN

## 2013-11-26 NOTE — Progress Notes (Signed)
ANTICOAGULATION CONSULT NOTE - Follow Up Consult  Pharmacy Consult for coumadin Indication: atrial fibrillation  Allergies  Allergen Reactions  . Benadryl [Diphenhydramine Hcl] Nausea And Vomiting    Patient Measurements: Height: 5\' 5"  (165.1 cm) Weight: 176 lb (79.833 kg) (c scale) IBW/kg (Calculated) : 61.5   Vital Signs: BP: 128/57 mmHg (11/06 0957) Pulse Rate: 67 (11/06 0957)  Labs:  Recent Labs  11/24/13 0442 11/25/13 0520 11/26/13 0320  LABPROT 29.6* 27.9* 27.6*  INR 2.79* 2.58* 2.54*  CREATININE 1.60* 1.36*  --     Estimated Creatinine Clearance: 47.1 mL/min (by C-G formula based on Cr of 1.36).   Medications:  Scheduled:  . alfuzosin  10 mg Oral Daily  . allopurinol  100 mg Oral Daily  . atorvastatin  10 mg Oral QHS  . carvedilol  12.5 mg Oral BID WC  . digoxin  125 mcg Oral Daily  . fluticasone  1 spray Each Nare BID  . isosorbide-hydrALAZINE  1 tablet Oral TID  . loratadine  10 mg Oral Daily  . mometasone-formoterol  2 puff Inhalation BID  . sodium chloride  3 mL Intravenous Q12H  . torsemide  20 mg Oral BID  . Warfarin - Pharmacist Dosing Inpatient   Does not apply q1800  . zolpidem  5 mg Oral Once    Assessment: 73 yo male here with SOB and here with acute on chronic systolic HF. He is on coumadin PTA for afib (admit INR= 3.29) and INR 2.54 today with trend down. He has been getting 2mg  coumadin for the last 3 days.   Home coumadin regimen: 3mg /day except 2mg  on MoTu  Goal of Therapy:  INR 2-3 Monitor platelets by anticoagulation protocol: Yes   Plan:  -Coumadin 2mg  po tonight -At discharge may need to consider reducing home regimen. Could consider 3mg /day with 2mg  MWF -Daily PT/INR  Hildred Laser, Pharm D 11/26/2013 10:04 AM

## 2013-11-26 NOTE — Discharge Summary (Signed)
Physician Discharge Summary  Troy [name] [id number] DOB: [birth year] DOA: 11/21/2013  PCP: Cloyd Stagers, MD  Admit date: 11/21/2013 Discharge date: 11/26/2013  Time spent: >35 minutes  Recommendations for Outpatient Follow-up:  F/u with PCP in 1 week  F/u with Cardiology in 1-2 weeks  F/u with Pulmonology in 3 weeks  Discharge Diagnoses:  Principal Problem:   Acute on chronic respiratory failure with hypoxia Active Problems:   Acute on chronic systolic CHF (congestive heart failure)   Chronic kidney disease, stage 3   Coronary artery disease involving native coronary artery of native heart without angina pectoris   Paroxysmal atrial fibrillation   Frequent PVCs   Hypoxia   Chronic obstructive pulmonary disease with acute exacerbation   Dyspnea   Hemoptysis   Discharge Condition: stable   Diet recommendation: heart healthy  Filed Weights   11/24/13 0509 11/25/13 0541 11/26/13 0653  Weight: 79.606 kg (175 lb 8 oz) 79.2 kg (174 lb 9.7 oz) 79.833 kg (176 lb)    History of present illness:  73 y/o male with PMH of CHF, COPD, paroxysmal atrial fibrillation, s/p CRT-D ([employer]), PAF presented with acute hypoxia, SOB, congestion     Hospital Course:  1. Acute on chronic respiratory failure with hypoxia due to Acute on chronic systolic CHF (congestive heart failure)+COPD -hypoxia resolved on IV diuretics+respiratoty treatment -diuretics changed to PO, cont bronchodilators, oxygen; home oxygen upon d/c   2. Chronic obstructive pulmonary disease with acute exacerbation; severe COPD -cont bronchodilators, oxygen, prednisone/with taper; afebrile; no leukocytosis  -CT: ? 5 mm right upper lobe pulmonary nodule; recommended to f/u with Repeat CT as outpatient 3-6 month  3. Coronary artery disease involving native coronary artery of native heart without angina pectoris: - asx, cont. continue BB, statin, no ASA because of coumadin 4. Paroxysmal atrial  fibrillation - Consult cards, pacer interrogated. Cont coumadin  5. Acute on chronic CHF, systolic HF; echo: LVEF 123XX123 % -Pt diuresed well; I/O neg 4.7L; diuretics changed to PO; cont BB, dig; per cards added ntg/hydralazine    Procedures:  none (i.e. Studies not automatically included, echos, thoracentesis, etc; not x-rays)  Consultations:  Cardiology   Discharge Exam: Filed Vitals:   11/26/13 0957  BP: 128/57  Pulse: 67  Temp:   Resp:     General: alert Cardiovascular: s1,s2 rrr Respiratory: no wheezing   Discharge Instructions  Discharge Instructions    Diet - low sodium heart healthy    Complete by:  As directed      Discharge instructions    Complete by:  As directed   Please follow up with primary care doctor in 1 week  Please follow up with cardiology in 1-2 weeks     Increase activity slowly    Complete by:  As directed             Medication List    STOP taking these medications        losartan 50 MG tablet  Commonly known as:  COZAAR     Umeclidinium-Vilanterol 62.5-25 MCG/INH Aepb      TAKE these medications        albuterol 1.25 MG/3ML nebulizer solution  Commonly known as:  ACCUNEB  Take 3 mLs (1.25 mg total) by nebulization every 6 (six) hours as needed for wheezing.     albuterol 108 (90 BASE) MCG/ACT inhaler  Commonly known as:  PROVENTIL HFA;VENTOLIN HFA  Inhale 2 puffs into the lungs every 4 (four) hours as needed for  wheezing or shortness of breath.     alfuzosin 10 MG 24 hr tablet  Commonly known as:  UROXATRAL  Take 10 mg by mouth daily.     allopurinol 100 MG tablet  Commonly known as:  ZYLOPRIM  Take 100 mg by mouth daily.     atorvastatin 10 MG tablet  Commonly known as:  LIPITOR  Take 1 tablet (10 mg total) by mouth at bedtime.     carvedilol 12.5 MG tablet  Commonly known as:  COREG  Take 1 tablet (12.5 mg total) by mouth 2 (two) times daily with a meal.     digoxin 0.125 MG tablet  Commonly known as:  LANOXIN   Take 1 tablet (125 mcg total) by mouth daily.     fluticasone 50 MCG/ACT nasal spray  Commonly known as:  FLONASE  Place 1 spray into both nostrils 2 (two) times daily.     guaiFENesin-dextromethorphan 100-10 MG/5ML syrup  Commonly known as:  ROBITUSSIN DM  Take 5 mLs by mouth every 4 (four) hours as needed for cough.     isosorbide-hydrALAZINE 20-37.5 MG per tablet  Commonly known as:  BIDIL  Take 1 tablet by mouth 3 (three) times daily.     loratadine 10 MG tablet  Commonly known as:  CLARITIN  Take 10 mg by mouth daily. For allergies     mometasone-formoterol 100-5 MCG/ACT Aero  Commonly known as:  DULERA  Inhale 2 puffs into the lungs 2 (two) times daily.     predniSONE 10 MG tablet  Commonly known as:  DELTASONE  Take 1 tablet (10 mg total) by mouth daily with breakfast.     torsemide 20 MG tablet  Commonly known as:  DEMADEX  Take 1 tablet (20 mg total) by mouth 2 (two) times daily.     warfarin 1 MG tablet  Commonly known as:  COUMADIN  Take 2-3 mg by mouth See admin instructions. Takes 2 tablets on Monday, Tuesday and then take 3 tablets on all other days       Allergies  Allergen Reactions  . Benadryl [Diphenhydramine Hcl] Nausea And Vomiting       Follow-up Information    Follow up with [facility], IBTEHAL, MD. Schedule an appointment as soon as possible for a visit in 1 week.   Specialty:  Internal Medicine   Contact information:   [address]  [address] [place] [address] [phone]        The results of significant diagnostics from this hospitalization (including imaging, microbiology, ancillary and laboratory) are listed below for reference.    Significant Diagnostic Studies: Dg Chest 2 View  11/07/2013   CLINICAL DATA:  Shortness of breath and chest pain one week.  EXAM: CHEST  2 VIEW  COMPARISON:  06/13/2013  FINDINGS: Left-sided pacemaker is unchanged. Lungs are adequately inflated with minimal patchy infrahilar  opacification bilaterally as findings may be due to mild vascular congestion versus infection. Cannot exclude a small amount of pleural fluid bilaterally. Flattening of the hemidiaphragms unchanged. Mild cardiomegaly unchanged. Calcification over the thoracic aorta. Spondylosis throughout the thoracic spine.  IMPRESSION: Patchy bilateral perihilar opacification which may be due to edema versus infection. Cannot exclude a small amount of pleural fluid bilaterally.  Stable cardiomegaly.   Electronically Signed   By: Marin Olp M.D.   On: 11/07/2013 14:15   Ct Chest Wo Contrast  11/21/2013   CLINICAL DATA:  Short of breath for 3 days.  Cough and  hemoptysis.  EXAM: CT CHEST WITHOUT CONTRAST  TECHNIQUE: Multidetector CT imaging of the chest was performed following the standard protocol without IV contrast.  COMPARISON:  08/16/2005  FINDINGS: AICD device is in place from the left subclavian vein. Tips of the leads are in the right atrium, right ventricle, and coronary sinus.  No evidence of abnormal mediastinal adenopathy. Small nodes are noted  Small pericardial effusion is re- demonstrated.  No pneumothorax. Small bilateral pleural effusions right greater than left.  Patchy areas of ground-glass are scattered throughout the right lung with a prep collection for the right middle and upper lobes. 5 mm right upper lobe nodule on image 45. Patchy density at the base of the right middle lobe laterally on image 49 is atelectasis versus airspace disease. Similar confluent opacity in the upper posterior right middle lobe on image 32.  9 mm visceral pleural node in the right minor fissure is stable. Dependent atelectasis at the lung bases bilaterally.  Tiny left posterior basilar Bochdalek's hernia.  No acute vertebral compression deformity. There is kyphosis of the lower thoracic spine which has a chronic appearance.  Low-density lesion at the dome of the liver on image 50 is stable.  There is free fluid in the abdomen  within the pericolic gutters.  IMPRESSION: Patchy ground-glass and airspace opacities in the right upper and middle lobes worrisome for an inflammatory process.  Small bilateral pleural effusions per  Small pericardial effusion  AICD device  Stable liver lesions supporting benign etiology.  Small amount of free fluid in the abdomen.  Stable right mm visceral pleural lymph node. New 5 mm right upper lobe pulmonary nodule. If the patient is at high risk for bronchogenic carcinoma, follow-up chest CT at 6-12 months is recommended. If the patient is at low risk for bronchogenic carcinoma, follow-up chest CT at 12 months is recommended. This recommendation follows the consensus statement: Guidelines for Management of Small Pulmonary Nodules Detected on CT Scans: A Statement from the [employer] as published in Radiology 2005;237:395-400.   Electronically Signed   By: Maryclare Bean M.D.   On: 11/21/2013 15:04   Dg Chest Port 1 View  11/21/2013   CLINICAL DATA:  Shortness of breath.  EXAM: PORTABLE CHEST - 1 VIEW  COMPARISON:  11/07/2013.  FINDINGS: Mediastinum and hilar structures are normal. Cardiomegaly. Mild pulmonary vascular prominence. Mild interstitial prominence. Partially cleared from prior exam. Left pleural effusion has cleared. Cardiac pacer and lead tips in right atrium right ventricle. No pneumothorax. No acute bony abnormality.  IMPRESSION: 1. Mild congestive heart failure. Mild interstitial edema has partially cleared from prior exam. Left pleural effusion has cleared. 2. Cardiac pacer with lead tips in right atrium right ventricle.   Electronically Signed   By: Marcello Moores  Register   On: 11/21/2013 11:37    Microbiology: No results found for this or any previous visit (from the past 240 hour(s)).   Labs: Basic Metabolic Panel:  Recent Labs Lab 11/21/13 1109 11/22/13 0332 11/23/13 0552 11/24/13 0442 11/25/13 0520  NA 140 142 139 141 143  K 3.6* 3.6* 3.8 3.7 3.8  CL 100 102 99 100 100   CO2 29 28 31  34* 31  GLUCOSE 150* 120* 122* 116* 108*  BUN 21 20 19 22 22   CREATININE 1.52* 1.41* 1.32 1.60* 1.36*  CALCIUM 8.9 8.2* 8.6 8.7 9.2  MG  --   --   --   --  1.8   Liver Function Tests: No results for input(s): AST,  ALT, ALKPHOS, BILITOT, PROT, ALBUMIN in the last 168 hours. No results for input(s): LIPASE, AMYLASE in the last 168 hours. No results for input(s): AMMONIA in the last 168 hours. CBC:  Recent Labs Lab 11/21/13 1109  WBC 7.4  HGB 13.4  HCT 39.4  MCV 96.1  PLT 117*   Cardiac Enzymes:  Recent Labs Lab 11/21/13 1154 11/21/13 1600 11/21/13 2128 11/22/13 0332  TROPONINI <0.30 <0.30 <0.30 <0.30   BNP: BNP (last 3 results)  Recent Labs  11/07/13 1114 11/21/13 1109  PROBNP 7451.0* 14338.0*   CBG: No results for input(s): GLUCAP in the last 168 hours.     SignedKinnie Feil  [facility] 11/26/2013, 10:20 AM

## 2013-11-26 NOTE — Progress Notes (Signed)
SATURATION QUALIFICATIONS: (This note is used to comply with regulatory documentation for home oxygen)  Patient Saturations on Room Air at Rest = 92%  Patient Saturations on Room Air while Ambulating = 82%  Patient Saturations on 2 Liters of oxygen while Ambulating = 94%  Please briefly explain why patient needs home oxygen:

## 2013-11-26 NOTE — Progress Notes (Signed)
Pt was ambulated in hall without O2, and his O2 sats resulted 86.  Pt is currently on 2L, [place].

## 2013-11-26 NOTE — Progress Notes (Signed)
D/C'd pt; D/C'd tele; D/C'd IV.  Explained discharge instructions to pt. Explained next doses of medication.  Pt had no further questions.  Pt in no acute distress.

## 2013-12-10 ENCOUNTER — Ambulatory Visit (INDEPENDENT_AMBULATORY_CARE_PROVIDER_SITE_OTHER): Payer: Commercial Managed Care - HMO | Admitting: *Deleted

## 2013-12-10 ENCOUNTER — Encounter: Payer: Commercial Managed Care - HMO | Admitting: Cardiology

## 2013-12-10 DIAGNOSIS — I5022 Chronic systolic (congestive) heart failure: Secondary | ICD-10-CM

## 2013-12-10 DIAGNOSIS — I5023 Acute on chronic systolic (congestive) heart failure: Secondary | ICD-10-CM

## 2013-12-10 DIAGNOSIS — I493 Ventricular premature depolarization: Secondary | ICD-10-CM

## 2013-12-10 LAB — MDC_IDC_ENUM_SESS_TYPE_INCLINIC
Battery Remaining Longevity: 55.2 mo
Brady Statistic RV Percent Paced: 81 %
Date Time Interrogation Session: 20151120133051
HighPow Impedance: 52.7601
Implantable Pulse Generator Model: 3265
Implantable Pulse Generator Serial Number: 7001284
Lead Channel Impedance Value: 450 Ohm
Lead Channel Impedance Value: 575 Ohm
Lead Channel Impedance Value: 825 Ohm
Lead Channel Pacing Threshold Amplitude: 0.625 V
Lead Channel Pacing Threshold Amplitude: 1 V
Lead Channel Pacing Threshold Pulse Width: 0.5 ms
Lead Channel Pacing Threshold Pulse Width: 0.5 ms
Lead Channel Pacing Threshold Pulse Width: 0.5 ms
Lead Channel Sensing Intrinsic Amplitude: 5 mV
Lead Channel Setting Pacing Amplitude: 2 V
Lead Channel Setting Pacing Pulse Width: 0.5 ms
Lead Channel Setting Sensing Sensitivity: 0.5 mV
MDC IDC MSMT LEADCHNL RV PACING THRESHOLD AMPLITUDE: 0.75 V
MDC IDC SET LEADCHNL LV PACING PULSEWIDTH: 0.5 ms
MDC IDC SET LEADCHNL RA PACING AMPLITUDE: 1.625
MDC IDC SET LEADCHNL RV PACING AMPLITUDE: 2 V
MDC IDC STAT BRADY RA PERCENT PACED: 50 %
Zone Setting Detection Interval: 330 ms

## 2013-12-10 NOTE — Progress Notes (Signed)
ICD check in office with [employer].

## 2013-12-14 ENCOUNTER — Ambulatory Visit (INDEPENDENT_AMBULATORY_CARE_PROVIDER_SITE_OTHER): Payer: Commercial Managed Care - HMO | Admitting: Interventional Cardiology

## 2013-12-14 ENCOUNTER — Encounter: Payer: Self-pay | Admitting: Interventional Cardiology

## 2013-12-14 VITALS — BP 130/60 | HR 79 | Ht 65.0 in | Wt 180.4 lb

## 2013-12-14 DIAGNOSIS — R0602 Shortness of breath: Secondary | ICD-10-CM

## 2013-12-14 DIAGNOSIS — I5022 Chronic systolic (congestive) heart failure: Secondary | ICD-10-CM

## 2013-12-14 DIAGNOSIS — I2510 Atherosclerotic heart disease of native coronary artery without angina pectoris: Secondary | ICD-10-CM

## 2013-12-14 DIAGNOSIS — I480 Paroxysmal atrial fibrillation: Secondary | ICD-10-CM

## 2013-12-14 MED ORDER — ISOSORB DINITRATE-HYDRALAZINE 20-37.5 MG PO TABS: 1.0000 | ORAL_TABLET | Freq: Two times a day (BID) | ORAL | Status: DC

## 2013-12-14 NOTE — Patient Instructions (Signed)
Your physician wants you to follow-up in: 6 months with Dr. Varanasi. You will receive a reminder letter in the mail two months in advance. If you don't receive a letter, please call our office to schedule the follow-up appointment.  Your physician recommends that you continue on your current medications as directed. Please refer to the Current Medication list given to you today.  

## 2013-12-14 NOTE — Progress Notes (Signed)
Patient ID: Troy [name], male   DOB: 1940/09/22, 73 y.o.   MRN: [id number] Patient ID: Troy [name], male   DOB: 05-Apr-1940, 73 y.o.   MRN: [id number]    [address], [place], [place]  [address] Phone: [phone] Fax:  [phone]  Date:  12/14/2013   ID:  LILLY LOVICK, DOB September 14, 1940, MRN [id number]  PCP:  Cloyd Stagers, MD      History of Present Illness: Troy [name] is a 73 y.o. male who has a low LVEF. He was hospitalized for hypoxic respiratory failure. Cardiology consultation was obtained but it was thought that his shortness of breath was more from pulmonary cause rather than cardiac. He continues to have pain in his neck. No problems lying flat. No significant lower extremity swelling. No significant change in his weight. 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 No bleeding problems.Using supplemental oxygen intermittently.  Cardiomyopathy:  c/o Dizziness while getting up from sitting position, lasting for seconds.  Denies : Chest pain.  Leg edema.  Orthopnea.  Paroxysmal nocturnal dyspnea.  Palpitations.  Syncope.     Wt Readings from Last 3 Encounters:  12/14/13 180 lb 6.4 oz (81.829 kg)  11/26/13 176 lb (79.833 kg)  09/10/13 190 lb (86.183 kg)     Past Medical History  Diagnosis Date  . CAD (coronary artery disease) 1996    status post PCI of the RCA   . Ischemic dilated cardiomyopathy   . HTN (hypertension)   . GERD (gastroesophageal reflux disease)   . Personal history of DVT (deep vein thrombosis)   . Diabetes mellitus, type 2   . DJD (degenerative joint disease)   . Gout   . Prostatitis   . Nephrolithiasis   . Atrial fibrillation   . Congestive heart failure, unspecified   . Other primary cardiomyopathies     Current Outpatient Prescriptions  Medication Sig Dispense Refill  . albuterol (ACCUNEB) 1.25 MG/3ML nebulizer solution Take 3 mLs (1.25 mg total) by nebulization every 6 (six) hours as  needed for wheezing. 75 mL 3  . albuterol (PROVENTIL HFA;VENTOLIN HFA) 108 (90 BASE) MCG/ACT inhaler Inhale 2 puffs into the lungs every 4 (four) hours as needed for wheezing or shortness of breath. 1 Inhaler 11  . alfuzosin (UROXATRAL) 10 MG 24 hr tablet Take 10 mg by mouth daily.    Marland Kitchen allopurinol (ZYLOPRIM) 100 MG tablet Take 100 mg by mouth daily.    Marland Kitchen atorvastatin (LIPITOR) 10 MG tablet Take 1 tablet (10 mg total) by mouth at bedtime. 90 tablet 3  . carvedilol (COREG) 12.5 MG tablet Take 1 tablet (12.5 mg total) by mouth 2 (two) times daily with a meal. 60 tablet 1  . digoxin (LANOXIN) 0.125 MG tablet Take 1 tablet (125 mcg total) by mouth daily. 60 tablet 1  . fluticasone (FLONASE) 50 MCG/ACT nasal spray Place 1 spray into both nostrils 2 (two) times daily.    Marland Kitchen guaiFENesin-dextromethorphan (ROBITUSSIN DM) 100-10 MG/5ML syrup Take 5 mLs by mouth every 4 (four) hours as needed for cough. 118 mL 0  . isosorbide-hydrALAZINE (BIDIL) 20-37.5 MG per tablet Take 1 tablet by mouth 3 (three) times daily. 60 tablet 1  . loratadine (CLARITIN) 10 MG tablet Take 10 mg by mouth daily. For allergies    . mometasone-formoterol (DULERA) 100-5 MCG/ACT AERO Inhale 2 puffs into the lungs 2 (two) times daily. 1 Inhaler 1  . torsemide (DEMADEX) 20 MG tablet Take 1  tablet (20 mg total) by mouth 2 (two) times daily. 60 tablet 1  . warfarin (COUMADIN) 1 MG tablet Take 2-3 mg by mouth See admin instructions. Takes 2 tablets on Monday, Tuesday and then take 3 tablets on all other days    . predniSONE (DELTASONE) 10 MG tablet Take 1 tablet (10 mg total) by mouth daily with breakfast. (Patient not taking: Reported on 12/14/2013) 30 tablet 0   No current facility-administered medications for this visit.    Allergies:    Allergies  Allergen Reactions  . Benadryl [Diphenhydramine Hcl] Nausea And Vomiting    Social History:  The patient  reports that he quit smoking about 33 years ago. His smoking use included  Cigarettes. He has a 200 pack-year smoking history. He has never used smokeless tobacco. He reports that he does not drink alcohol or use illicit drugs.   Family History:  The patient's family history includes Cancer in his father; Stroke in his paternal uncle.   ROS:  Please see the history of present illness.  No nausea, vomiting.  No fevers, chills.  No focal weakness.  No dysuria.  Mild nosebleed since using oxygen at home.  Uses it more at night.   All other systems reviewed and negative.   PHYSICAL EXAM: VS:  BP 130/60 mmHg  Pulse 79  Ht 5\' 5"  (1.651 m)  Wt 180 lb 6.4 oz (81.829 kg)  BMI 30.02 kg/m2 Well nourished, well developed, in no acute distress HEENT: normal Neck: no JVD, no carotid bruits, lipoma on back of right neck Cardiac:  normal S1, S2; irregularly irregularly Lungs:  clear to auscultation bilaterally, no wheezing, rhonchi or rales Abd: soft, nontender, no hepatomegaly Ext: tr bilateral ankle edema, R> L Skin: warm and dry Neuro:   no focal abnormalities noted  EKG:  Paced in 8/14     ASSESSMENT AND PLAN:  Coronary artery disease  Continue Carvedilol Tablet, 12.5 MG, 1 tablet with food, Orally, Twice a day  Notes: No angina. LDL 95. TG 230. Avoid fatty foods in the diet.  2. Atrial fibrillation  Notes: COumadin for stroke prevention.  No further GI bleeding fortunately.  3. Cardiomyopathy   Continue Carvedilol Tablet, 12.5 MG, 1 tablet with food, Orally, Twice a day Continue Digoxin Tablet, 0.125 MG, 1 tablet, Orally, Once a day Notes: No signs of CHF. BP controlled today. Check BP at drugstore at home and if readings are >140/90, let us know. Pacer/AICD ([employer] Jude) in place.  Added BiDil 20/37.5 mg BID.  WIll start it BID and increase to TID.  He never started this after leaving the hospital.  4. Combined hyperlipidemia  Continue Atorvastatin Calcium Tablet, 10 MG, 1 tablet, Orally, Once a day, 30 day(s), 30 Notes: Last LDL was 95 in June 2014. tolerating  well.  LDL 60 in 3/15.  5. SHOB: Intermittent. I think this is more likely a pulmonary cause. He does not appear grossly volume overloaded. He is not reporting symptoms of shortness of while lying flat. He will be seeing pulmonary next week. Continue to use home oxygen as needed.   Signed, Mina Marble, MD, [facility] 12/14/2013 12:24 PM

## 2013-12-23 ENCOUNTER — Encounter: Payer: Self-pay | Admitting: Emergency Medicine

## 2013-12-23 ENCOUNTER — Ambulatory Visit (INDEPENDENT_AMBULATORY_CARE_PROVIDER_SITE_OTHER): Payer: Commercial Managed Care - HMO | Admitting: Emergency Medicine

## 2013-12-23 VITALS — BP 120/58 | HR 84 | Ht 65.0 in | Wt 182.0 lb

## 2013-12-23 DIAGNOSIS — R911 Solitary pulmonary nodule: Secondary | ICD-10-CM

## 2013-12-23 DIAGNOSIS — J449 Chronic obstructive pulmonary disease, unspecified: Secondary | ICD-10-CM

## 2013-12-23 DIAGNOSIS — R918 Other nonspecific abnormal finding of lung field: Secondary | ICD-10-CM

## 2013-12-23 NOTE — Assessment & Plan Note (Signed)
Noted on CT chest from 11/21/13 when he was hospitalized with apparent volume overload. There is a 5 mm right upper lobe nodule that was noted stable subpleural lymph node. He needs a repeat CT scan of the chest in June 2016

## 2013-12-23 NOTE — Assessment & Plan Note (Signed)
Appears to be doing well on current regimen. Continue anoro once a day

## 2013-12-23 NOTE — Patient Instructions (Signed)
Please continue Anoro one puff once a day. Have CT Chest in May 2016.  Follow up with Dr. Lamonte Sakai after CT Chest.

## 2013-12-23 NOTE — Progress Notes (Signed)
   Subjective:    Patient ID: Troy [name], male    DOB: [birth year], 73 y.o.   MRN: [id number]  HPI 73 yo former smoker (100 pk-yrs), hx CAD and dilated CM, A Fib, DM, DVT's, AICD device. Dx w COPD several years ago. Referred by Dr Deforest Hoyles for dyspnea, cough and congestion. For the last month he has thick white mucous that is very difficult to get up. His SOB is worse. He can only walk 25 feet. He occasionally wheezes. He went to ED last Sunday, received nebs. He is on loratadine, fluticasone. Takes Advair.   ROV 07/29/13 -- follows for COPD, MMP as above. We changed advair to Anoro last visit > he feels much better, breathing is much improved. His cough has almost fully resolved. Also treated with pred taper + mucinex.  He is also on loratadine and fluticasone nasal spray.  We did not start Oxygen last time - I wanted to see if he would improve on BDs.   PFT > very severe AFL with borderline BD response. Decreased DLCO, normal volumes.   ROV 12/23/13 -- follow up visit for multifactorial dyspnea, severe COPD. He has been managed on Anoro.  Takes it once a day in the evening. He has not needed any albuterol . He has some occasional cough, non-productive. He doesn't have any wheezing. He was admitted 11/15 for pulmonary edema and hypoxemia.  He had a CT scan chest that showed GGI and a new 29mm RUL nodule that will need to be followed.    Review of Systems  Constitutional: Negative for fever and unexpected weight change.  HENT: Negative for congestion, dental problem, ear pain, nosebleeds, postnasal drip, rhinorrhea, sinus pressure, sneezing, sore throat and trouble swallowing.   Eyes: Negative for redness and itching.  Respiratory: Positive for cough and shortness of breath. Negative for chest tightness and wheezing.        Congestion  Cardiovascular: Negative for palpitations and leg swelling.  Gastrointestinal: Negative for nausea and vomiting.  Genitourinary: Negative for dysuria.    Musculoskeletal: Negative for joint swelling.  Skin: Negative for rash.  Neurological: Negative for headaches.  Hematological: Does not bruise/bleed easily.  Psychiatric/Behavioral: Negative for dysphoric mood. The patient is not nervous/anxious.       Objective:   Physical Exam Filed Vitals:   12/23/13 1408  BP: 120/58  Pulse: 84  Height: 5\' 5"  (1.651 m)  Weight: 182 lb (82.555 kg)  SpO2: 90%  Gen: Pleasant, well-nourished, in no distress,  normal affect  ENT: No lesions,  mouth clear,  oropharynx clear, disconjugate gaze, no postnasal drip  Neck: No JVD, no TMG, no carotid bruits  Lungs: No use of accessory muscles, no crackles or wheezing today.   Cardiovascular: RRR, heart sounds normal, no murmur or gallops, no peripheral edema  Musculoskeletal: No deformities, no cyanosis or clubbing  Neuro: alert, non focal  Skin: Warm, no lesions or rashes     Assessment & Plan:  Pulmonary nodules Noted on CT chest from 11/21/13 when he was hospitalized with apparent volume overload. There is a 5 mm right upper lobe nodule that was noted stable subpleural lymph node. He needs a repeat CT scan of the chest in June 2016  COPD (chronic obstructive pulmonary disease) Appears to be doing well on current regimen. Continue anoro once a day

## 2013-12-29 ENCOUNTER — Encounter: Payer: Self-pay | Admitting: Internal Medicine

## 2014-02-09 ENCOUNTER — Other Ambulatory Visit: Payer: Self-pay | Admitting: Emergency Medicine

## 2014-02-09 ENCOUNTER — Ambulatory Visit (INDEPENDENT_AMBULATORY_CARE_PROVIDER_SITE_OTHER): Payer: Medicare HMO | Admitting: *Deleted

## 2014-02-09 DIAGNOSIS — I82409 Acute embolism and thrombosis of unspecified deep veins of unspecified lower extremity: Secondary | ICD-10-CM

## 2014-02-09 DIAGNOSIS — I480 Paroxysmal atrial fibrillation: Secondary | ICD-10-CM

## 2014-02-09 LAB — POCT INR: INR: 1.6

## 2014-02-23 ENCOUNTER — Encounter: Payer: Self-pay | Admitting: *Deleted

## 2014-03-04 ENCOUNTER — Ambulatory Visit (INDEPENDENT_AMBULATORY_CARE_PROVIDER_SITE_OTHER): Payer: Medicare HMO | Admitting: *Deleted

## 2014-03-04 DIAGNOSIS — I5023 Acute on chronic systolic (congestive) heart failure: Secondary | ICD-10-CM | POA: Diagnosis not present

## 2014-03-04 DIAGNOSIS — I5022 Chronic systolic (congestive) heart failure: Secondary | ICD-10-CM

## 2014-03-04 LAB — MDC_IDC_ENUM_SESS_TYPE_INCLINIC
Date Time Interrogation Session: 20160212120728
HighPow Impedance: 42.986
HighPow Impedance: 43 Ohm
Implantable Pulse Generator Serial Number: 7001284
Lead Channel Impedance Value: 387.5 Ohm
Lead Channel Impedance Value: 462.5 Ohm
Lead Channel Impedance Value: 825 Ohm
Lead Channel Pacing Threshold Amplitude: 1.125 V
Lead Channel Pacing Threshold Pulse Width: 0.5 ms
Lead Channel Sensing Intrinsic Amplitude: 5 mV
Lead Channel Setting Pacing Amplitude: 2 V
Lead Channel Setting Sensing Sensitivity: 0.5 mV
MDC IDC MSMT BATTERY REMAINING LONGEVITY: 51.6 mo
MDC IDC MSMT LEADCHNL RA PACING THRESHOLD AMPLITUDE: 0.625 V
MDC IDC MSMT LEADCHNL RA PACING THRESHOLD PULSEWIDTH: 0.5 ms
MDC IDC MSMT LEADCHNL RV PACING THRESHOLD AMPLITUDE: 0.75 V
MDC IDC MSMT LEADCHNL RV PACING THRESHOLD PULSEWIDTH: 0.5 ms
MDC IDC MSMT LEADCHNL RV SENSING INTR AMPL: 12 mV
MDC IDC PG MODEL: 3265
MDC IDC SET LEADCHNL LV PACING AMPLITUDE: 2.125
MDC IDC SET LEADCHNL LV PACING PULSEWIDTH: 0.5 ms
MDC IDC SET LEADCHNL RA PACING AMPLITUDE: 1.625
MDC IDC SET LEADCHNL RV PACING PULSEWIDTH: 0.5 ms
MDC IDC STAT BRADY RA PERCENT PACED: 49 %
MDC IDC STAT BRADY RV PERCENT PACED: 84 %
Zone Setting Detection Interval: 330 ms

## 2014-03-04 NOTE — Progress Notes (Signed)
ICD check in office with ICM. 

## 2014-03-07 ENCOUNTER — Encounter (INDEPENDENT_AMBULATORY_CARE_PROVIDER_SITE_OTHER): Payer: Medicare HMO | Admitting: *Deleted

## 2014-03-08 NOTE — Progress Notes (Signed)
  This encounter was created in error - please disregard. Pt was in office for INR check.  When called back pt was very angry and verbally abusive toward coumadin nurse.  Was upset set because pt that signed in after him was called back for coumadin check before him.  Tried to explain to Troy [name] that previous pt's appt. was at 1:20pm and his appt was at 1:30pm and pt's are called back by appt time, not arrival time, unless no one is waiting.  I asked pt to sit down so I could check his blood.  He stated he did not want to sit down.  Told pt I was not going to argue with him.  He could sit down and get his blood checked or leave. He turn around and walked out of the office.  He did not stop to collect his co-pay he had paid for this visit.  Called and discussed the above with Annabell Sabal, office manager.  Asked her to call pt to discuss issues and offer to have pt come back for INR check if he had calmed down.  She tried to call pt but he did not answer.  She left a message on voice mail to call her back.  As of 2/16, pt has not called back.

## 2014-03-11 ENCOUNTER — Encounter: Payer: Self-pay | Admitting: Internal Medicine

## 2014-04-18 ENCOUNTER — Telehealth: Payer: Self-pay | Admitting: Emergency Medicine

## 2014-04-18 NOTE — Telephone Encounter (Signed)
Called and spoke with the pt  He states that he can no longer afford Anoro, copay is too high  He is asking for [place] to call in alternative  Please advise thanks

## 2014-04-21 ENCOUNTER — Emergency Department (HOSPITAL_COMMUNITY)
Admission: EM | Admit: 2014-04-21 | Discharge: 2014-04-21 | Disposition: A | Discharge status: Home / Self Care | Payer: Medicare HMO | Attending: Emergency Medicine | Admitting: Emergency Medicine

## 2014-04-21 ENCOUNTER — Emergency Department (HOSPITAL_COMMUNITY): Payer: Medicare HMO

## 2014-04-21 ENCOUNTER — Encounter (HOSPITAL_COMMUNITY): Payer: Self-pay | Admitting: Family Medicine

## 2014-04-21 ENCOUNTER — Telehealth: Payer: Self-pay | Admitting: Interventional Cardiology

## 2014-04-21 DIAGNOSIS — K219 Gastro-esophageal reflux disease without esophagitis: Secondary | ICD-10-CM | POA: Insufficient documentation

## 2014-04-21 DIAGNOSIS — J449 Chronic obstructive pulmonary disease, unspecified: Secondary | ICD-10-CM | POA: Insufficient documentation

## 2014-04-21 DIAGNOSIS — I2510 Atherosclerotic heart disease of native coronary artery without angina pectoris: Secondary | ICD-10-CM | POA: Diagnosis not present

## 2014-04-21 DIAGNOSIS — I509 Heart failure, unspecified: Secondary | ICD-10-CM

## 2014-04-21 DIAGNOSIS — R0602 Shortness of breath: Secondary | ICD-10-CM | POA: Diagnosis present

## 2014-04-21 DIAGNOSIS — Z86718 Personal history of other venous thrombosis and embolism: Secondary | ICD-10-CM | POA: Diagnosis not present

## 2014-04-21 DIAGNOSIS — Z9981 Dependence on supplemental oxygen: Secondary | ICD-10-CM | POA: Insufficient documentation

## 2014-04-21 DIAGNOSIS — Z7951 Long term (current) use of inhaled steroids: Secondary | ICD-10-CM | POA: Diagnosis not present

## 2014-04-21 DIAGNOSIS — M109 Gout, unspecified: Secondary | ICD-10-CM | POA: Insufficient documentation

## 2014-04-21 DIAGNOSIS — Z87438 Personal history of other diseases of male genital organs: Secondary | ICD-10-CM | POA: Insufficient documentation

## 2014-04-21 DIAGNOSIS — I10 Essential (primary) hypertension: Secondary | ICD-10-CM | POA: Insufficient documentation

## 2014-04-21 DIAGNOSIS — Z87891 Personal history of nicotine dependence: Secondary | ICD-10-CM | POA: Diagnosis not present

## 2014-04-21 DIAGNOSIS — I5043 Acute on chronic combined systolic (congestive) and diastolic (congestive) heart failure: Secondary | ICD-10-CM | POA: Diagnosis not present

## 2014-04-21 DIAGNOSIS — Z8673 Personal history of transient ischemic attack (TIA), and cerebral infarction without residual deficits: Secondary | ICD-10-CM | POA: Diagnosis not present

## 2014-04-21 DIAGNOSIS — Z79899 Other long term (current) drug therapy: Secondary | ICD-10-CM | POA: Diagnosis not present

## 2014-04-21 DIAGNOSIS — E119 Type 2 diabetes mellitus without complications: Secondary | ICD-10-CM | POA: Diagnosis not present

## 2014-04-21 DIAGNOSIS — Z7901 Long term (current) use of anticoagulants: Secondary | ICD-10-CM | POA: Insufficient documentation

## 2014-04-21 DIAGNOSIS — Z87442 Personal history of urinary calculi: Secondary | ICD-10-CM | POA: Insufficient documentation

## 2014-04-21 HISTORY — DX: Chronic obstructive pulmonary disease, unspecified: J44.9

## 2014-04-21 LAB — BRAIN NATRIURETIC PEPTIDE: B Natriuretic Peptide: 1883.1 pg/mL — ABNORMAL HIGH (ref 0.0–100.0)

## 2014-04-21 LAB — COMPREHENSIVE METABOLIC PANEL
ALT: 11 U/L (ref 0–53)
ANION GAP: 11 (ref 5–15)
AST: 19 U/L (ref 0–37)
Albumin: 3.2 g/dL — ABNORMAL LOW (ref 3.5–5.2)
Alkaline Phosphatase: 79 U/L (ref 39–117)
BUN: 11 mg/dL (ref 6–23)
CALCIUM: 9 mg/dL (ref 8.4–10.5)
CO2: 27 mmol/L (ref 19–32)
Chloride: 102 mmol/L (ref 96–112)
Creatinine, Ser: 1.46 mg/dL — ABNORMAL HIGH (ref 0.50–1.35)
GFR, EST AFRICAN AMERICAN: 53 mL/min — AB (ref >90)
GFR, EST NON AFRICAN AMERICAN: 46 mL/min — AB (ref >90)
GLUCOSE: 112 mg/dL — AB (ref 70–99)
Potassium: 3.7 mmol/L (ref 3.5–5.1)
Sodium: 140 mmol/L (ref 135–145)
Total Bilirubin: 1.1 mg/dL (ref 0.3–1.2)
Total Protein: 5.9 g/dL — ABNORMAL LOW (ref 6.0–8.3)

## 2014-04-21 LAB — CBC WITH DIFFERENTIAL/PLATELET
BASOS ABS: 0 10*3/uL (ref 0.0–0.1)
BASOS PCT: 0 % (ref 0–1)
EOS ABS: 0.1 10*3/uL (ref 0.0–0.7)
Eosinophils Relative: 1 % (ref 0–5)
HCT: 38.7 % — ABNORMAL LOW (ref 39.0–52.0)
HEMOGLOBIN: 12.3 g/dL — AB (ref 13.0–17.0)
Lymphocytes Relative: 22 % (ref 12–46)
Lymphs Abs: 1.4 10*3/uL (ref 0.7–4.0)
MCH: 31.8 pg (ref 26.0–34.0)
MCHC: 31.8 g/dL (ref 30.0–36.0)
MCV: 100 fL (ref 78.0–100.0)
MONOS PCT: 11 % (ref 3–12)
Monocytes Absolute: 0.7 10*3/uL (ref 0.1–1.0)
NEUTROS PCT: 66 % (ref 43–77)
Neutro Abs: 4.3 10*3/uL (ref 1.7–7.7)
Platelets: 114 10*3/uL — ABNORMAL LOW (ref 150–400)
RBC: 3.87 MIL/uL — ABNORMAL LOW (ref 4.22–5.81)
RDW: 15.2 % (ref 11.5–15.5)
WBC: 6.4 10*3/uL (ref 4.0–10.5)

## 2014-04-21 LAB — PROTIME-INR
INR: 2.14 — ABNORMAL HIGH (ref 0.00–1.49)
Prothrombin Time: 24.1 seconds — ABNORMAL HIGH (ref 11.6–15.2)

## 2014-04-21 LAB — TROPONIN I: TROPONIN I: 0.04 ng/mL — AB (ref <0.031)

## 2014-04-21 LAB — DIGOXIN LEVEL: Digoxin Level: 1.1 ng/mL (ref 0.8–2.0)

## 2014-04-21 MED ORDER — FUROSEMIDE 10 MG/ML IJ SOLN
80.0000 mg | Freq: Once | INTRAMUSCULAR | Status: AC
  Administered 2014-04-21: 80 mg via INTRAVENOUS
  Filled 2014-04-21: qty 8

## 2014-04-21 MED ORDER — FUROSEMIDE 40 MG PO TABS: ORAL_TABLET | ORAL | Status: DC

## 2014-04-21 NOTE — ED Notes (Signed)
Ambulated pt in hall in front of nurses deck o2 stats range from 92% to 99%

## 2014-04-21 NOTE — ED Notes (Signed)
Pt presents from home via POV with c/o increased shortness of breath over the course of the last two weeks. Pt reports dyspnea on exertion and denies chest pain.

## 2014-04-21 NOTE — ED Notes (Signed)
Dr. Campos at bedside   

## 2014-04-21 NOTE — Telephone Encounter (Signed)
Called and spoke with pt. Pt states that he was seen in the ED today for SOB and "too much fluid". Pt states that he was given IV Lasix and was able to urinate a large amount. Pt states that ED doctor increased his Lasix 40mg  to two tabs in the morning and one in the evening. Pt states that he hopes that the increase will help to get the fluid off of him. Pt states that SOB has improved since leaving the hospital and that he is feeling better. Pt states that he called to schedule an appointment because the ED doctor told him to follow up with cardiology. Pt agrees to see an extender if unable to see [name]. Attempted to schedule an appt but pt was disagreeable with times provided. Informed pt that I would have scheduler look into available appts and call him to get appt scheduled. Pt verbalized understanding and was in agreement with this plan. Pt very thankful for call.

## 2014-04-21 NOTE — ED Provider Notes (Signed)
CSN: [id number]     Arrival date & time 04/21/14  0919 History   First MD Initiated Contact with Patient 04/21/14 0930     Chief Complaint  Patient presents with  . 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  He states he slept poorly last night.  She denies significant orthopnea or weight gain.  He states compliance with his diuretics.  He reports nonproductive cough.  Denies fever or chills.  Denies she lateral leg swelling.  No history of DVT or pulmonary embolism.  Patient is on digoxin as well as Coumadin.  Denies black or bloody stools.  Does report a history of GI bleed in the past.  He states he tried his nebulizer without significant improvement today.  He feels as though his voice is slightly hoarse and his mouth is dry.  Denies polyuria or polydipsia.   Past Medical History  Diagnosis Date  . CAD (coronary artery disease) 1996    status post PCI of the RCA   . Ischemic dilated cardiomyopathy   . HTN (hypertension)   . GERD (gastroesophageal reflux disease)   . Personal history of DVT (deep vein thrombosis)   . Diabetes mellitus, type 2   . DJD (degenerative joint disease)   . Gout   . Prostatitis   . Nephrolithiasis   . Atrial fibrillation   . Congestive heart failure, unspecified   . Other primary cardiomyopathies   . COPD (chronic obstructive pulmonary disease)     Pt on home O2 at night and PRN, unsure of date of diagnosis.   Past Surgical History  Procedure Laterality Date  . Back surgery    . Coronary stent placement     Family History  Problem Relation Age of Onset  . Cancer Father     colon  . Stroke Paternal Uncle    History  Substance Use Topics  . Smoking status: Former Smoker -- 4.00 packs/day for 50 years    Types: Cigarettes    Quit date: 01/22/1980  . Smokeless tobacco: Never Used  . Alcohol  Use: No     Comment: denies    Review of Systems  All other systems reviewed and are negative.     Allergies  Benadryl  Home Medications   Prior to Admission medications   Medication Sig Start Date End Date Taking? Authorizing Provider  acetaminophen (TYLENOL) 500 MG tablet Take 1,000 mg by mouth every 6 (six) hours as needed for mild pain or moderate pain.   Yes Historical Provider, MD  albuterol (ACCUNEB) 1.25 MG/3ML nebulizer solution INHALE ONE VIAL VIA NEBULIZER EVERY 6 HOURS AS NEEDED FOR WHEEZING. 02/09/14  Yes Collene Gobble, MD  albuterol (PROVENTIL HFA;VENTOLIN HFA) 108 (90 BASE) MCG/ACT inhaler Inhale 2 puffs into the lungs every 4 (four) hours as needed for wheezing or shortness of breath. 11/26/13  Yes Kinnie Feil, MD  alfuzosin (UROXATRAL) 10 MG 24 hr tablet Take 10 mg by mouth daily.   Yes Historical Provider, MD  allopurinol (ZYLOPRIM) 100 MG tablet Take 100 mg by mouth daily.   Yes Historical Provider, MD  atorvastatin (LIPITOR) 10 MG tablet Take 1 tablet (10 mg total) by mouth at bedtime. 06/15/13  Yes Jettie Booze, MD  carvedilol (COREG) 12.5 MG tablet Take 1 tablet (12.5 mg total) by mouth 2 (two) times daily  with a meal. 11/26/13  Yes Kinnie Feil, MD  digoxin (LANOXIN) 0.125 MG tablet Take 1 tablet (125 mcg total) by mouth daily. 11/26/13  Yes Kinnie Feil, MD  fluticasone (FLONASE) 50 MCG/ACT nasal spray Place 1 spray into both nostrils 2 (two) times daily. 04/07/13  Yes Historical Provider, MD  furosemide (LASIX) 40 MG tablet Take 40 mg by mouth daily. 04/12/14  Yes Historical Provider, MD  guaiFENesin-dextromethorphan (ROBITUSSIN DM) 100-10 MG/5ML syrup Take 5 mLs by mouth every 4 (four) hours as needed for cough. 11/26/13  Yes Kinnie Feil, MD  loratadine (CLARITIN) 10 MG tablet Take 10 mg by mouth daily. For allergies   Yes Historical Provider, MD  losartan (COZAAR) 100 MG tablet Take 100 mg by mouth daily.  03/30/14  Yes Historical Provider, MD   omeprazole (PRILOSEC) 40 MG capsule Take 40 mg by mouth daily.   Yes Historical Provider, MD  Umeclidinium-Vilanterol 62.5-25 MCG/INH AEPB Inhale 1-2 puffs into the lungs every evening.    Yes Historical Provider, MD  warfarin (COUMADIN) 1 MG tablet Take 2-3 mg by mouth See admin instructions. Takes 2 tablets on Monday, Tuesday and then take 3 tablets on all other days   Yes Historical Provider, MD  isosorbide-hydrALAZINE (BIDIL) 20-37.5 MG per tablet Take 1 tablet by mouth 2 (two) times daily. Patient not taking: Reported on 04/21/2014 12/14/13   Jettie Booze, MD  mometasone-formoterol [facility]) 100-5 MCG/ACT AERO Inhale 2 puffs into the lungs 2 (two) times daily. Patient not taking: Reported on 04/21/2014 11/26/13   Kinnie Feil, MD          BP 145/68 mmHg  Pulse 61  Temp(Src) 97.7 F (36.5 C) (Oral)  Resp 18  SpO2 97% Physical Exam  Constitutional: He is oriented to person, place, and time. He appears well-developed and well-nourished.  HENT:  Head: Normocephalic and atraumatic.  Eyes: EOM are normal.  Neck: Normal range of motion.  Cardiovascular: Normal rate, regular rhythm, normal heart sounds and intact distal pulses.   Pulmonary/Chest: Effort normal and breath sounds normal. No respiratory distress. He has no wheezes.  Abdominal: Soft. He exhibits no distension. There is no tenderness.  Musculoskeletal: Normal range of motion.  Neurological: He is alert and oriented to person, place, and time.  Skin: Skin is warm and dry.  Psychiatric: He has a normal mood and affect. Judgment normal.  Nursing note and vitals reviewed.   ED Course  Procedures (including critical care time) Labs Review Labs Reviewed  CBC WITH DIFFERENTIAL/PLATELET - Abnormal; Notable for the following:    RBC 3.87 (*)    Hemoglobin 12.3 (*)    HCT 38.7 (*)    Platelets 114 (*)    All other components within normal limits  COMPREHENSIVE METABOLIC PANEL - Abnormal; Notable for the following:     Glucose, Bld 112 (*)    Creatinine, Ser 1.46 (*)    Total Protein 5.9 (*)    Albumin 3.2 (*)    GFR calc non Af Amer 46 (*)    GFR calc Af Amer 53 (*)    All other components within normal limits  TROPONIN I - Abnormal; Notable for the following:    Troponin I 0.04 (*)    All other components within normal limits  BRAIN NATRIURETIC PEPTIDE - Abnormal; Notable for the following:    B Natriuretic Peptide 1883.1 (*)    All other components within normal limits  PROTIME-INR - Abnormal; Notable for the following:  Prothrombin Time 24.1 (*)    INR 2.14 (*)    All other components within normal limits  DIGOXIN LEVEL    Imaging Review Dg Chest 2 View  04/21/2014   CLINICAL DATA:  75 year old male with shortness of breath x1 week. Initial encounter.  EXAM: CHEST  2 VIEW  COMPARISON:  Chest CT 11/21/2013 and earlier.  FINDINGS: Cardiomegaly with left chest cardiac AICD. Stable cardiac size and mediastinal contours. Chronic small bilateral pleural effusions appear unchanged since 2015. No superimposed pneumothorax. Mildly increased pulmonary vascularity. No consolidation or confluent pulmonary opacity. Flowing osteophytes or syndesmophytes in the spine. Calcified atherosclerosis of the aorta. No acute osseous abnormality identified.  IMPRESSION: Chronic small pleural effusions. Mildly increased pulmonary vascularity could indicate mild or developing interstitial edema, or less likely viral/atypical respiratory infection.   Electronically Signed   By: Genevie Ann M.D.   On: 04/21/2014 10:44     EKG Interpretation   Date/Time:  Thursday April 21 2014 09:27:37 EDT Ventricular Rate:  87 PR Interval:  198 QRS Duration: 129 QT Interval:  393 QTC Calculation: 473 R Axis:   -112 Text Interpretation:  Sinus rhythm Ventricular bigeminy RBBB Baseline  wander in lead(s) I II aVR Confirmed by Haislee Corso  MD, Edel Rivero ([id number]) on  04/21/2014 10:44:51 AM      MDM   Final diagnoses:  Acute on chronic  congestive heart failure, unspecified congestive heart failure type   Patient with acute on chronic congestive heart failure.  His had approximately 10 pound weight gain since his last weight in the electronic medical record.  I think part of the problem is that he was taking Demadex as prescribed by his cardiologist and recently he stated this wasn't working and his primary care doctor switched him to Lasix.  There needs to be better continuity of care between the patient's cardiologist is primary care physician.  At this time I will make a change to his Lasix but he may benefit from going back onto the Demadex.  I referred him back to his primary cardiologist for more thorough evaluation and medication management of this patient's congestive heart failure.  I will increase him from 40 of Lasix twice a day to 80 of Lasix in the morning and 40 mg in the evening.  Patient understands to return to the ER for new or worsening symptoms.  He had been around the emergency department without any difficulty.  He feels better.  IV diuresis was given the emergency department and he has urinated.    Jola Schmidt, MD 04/21/14 1256

## 2014-04-21 NOTE — Telephone Encounter (Signed)
Pt c/o medication issue:  1. Name of Medication: Lasix   2. How are you currently taking this medication (dosage and times per day)? Was taking 2 per day. That wasn't working because he wasn't going to the bathroom. The hospital upped his dosage to to in the am and 1 in the pm.   3. Are you having a reaction (difficulty breathing--STAT)? Having difficulty breathing. SOB when he gets up and starts moving  4. What is your medication issue? Still unable to go to the bathroom. Has a lot of fluid built up.

## 2014-04-21 NOTE — ED Notes (Signed)
PT comfortable with discharge and follow up instructions. Prescriptions x1. 

## 2014-04-22 NOTE — Telephone Encounter (Signed)
RB please advise. Thanks.  

## 2014-04-25 ENCOUNTER — Telehealth: Payer: Self-pay

## 2014-04-25 ENCOUNTER — Ambulatory Visit (INDEPENDENT_AMBULATORY_CARE_PROVIDER_SITE_OTHER): Payer: Medicare HMO | Admitting: Physician Assistant

## 2014-04-25 ENCOUNTER — Other Ambulatory Visit: Payer: Self-pay

## 2014-04-25 ENCOUNTER — Encounter: Payer: Self-pay | Admitting: Physician Assistant

## 2014-04-25 VITALS — BP 118/68 | HR 72 | Ht 65.0 in | Wt 189.8 lb

## 2014-04-25 DIAGNOSIS — I480 Paroxysmal atrial fibrillation: Secondary | ICD-10-CM | POA: Diagnosis not present

## 2014-04-25 DIAGNOSIS — I2510 Atherosclerotic heart disease of native coronary artery without angina pectoris: Secondary | ICD-10-CM | POA: Diagnosis not present

## 2014-04-25 DIAGNOSIS — E876 Hypokalemia: Secondary | ICD-10-CM

## 2014-04-25 DIAGNOSIS — I5023 Acute on chronic systolic (congestive) heart failure: Secondary | ICD-10-CM | POA: Diagnosis not present

## 2014-04-25 DIAGNOSIS — I5021 Acute systolic (congestive) heart failure: Secondary | ICD-10-CM

## 2014-04-25 LAB — BASIC METABOLIC PANEL
BUN: 17 mg/dL (ref 6–23)
CO2: 34 mEq/L — ABNORMAL HIGH (ref 19–32)
CREATININE: 1.39 mg/dL (ref 0.40–1.50)
Calcium: 9 mg/dL (ref 8.4–10.5)
Chloride: 101 mEq/L (ref 96–112)
GFR: 53.05 mL/min — ABNORMAL LOW (ref >60.00)
Glucose, Bld: 89 mg/dL (ref 70–99)
Potassium: 3.3 mEq/L — ABNORMAL LOW (ref 3.5–5.1)
Sodium: 140 mEq/L (ref 135–145)

## 2014-04-25 LAB — BRAIN NATRIURETIC PEPTIDE: Pro B Natriuretic peptide (BNP): 2433 pg/mL — ABNORMAL HIGH (ref 0.0–100.0)

## 2014-04-25 MED ORDER — POTASSIUM CHLORIDE ER 10 MEQ PO TBCR: 10.0000 meq | EXTENDED_RELEASE_TABLET | Freq: Every day | ORAL | Status: DC

## 2014-04-25 MED ORDER — HYDRALAZINE HCL 25 MG PO TABS: 25.0000 mg | ORAL_TABLET | Freq: Three times a day (TID) | ORAL | Status: DC

## 2014-04-25 MED ORDER — FUROSEMIDE 80 MG PO TABS: 80.0000 mg | ORAL_TABLET | Freq: Two times a day (BID) | ORAL | Status: DC

## 2014-04-25 NOTE — Assessment & Plan Note (Signed)
Continue Coumadin. 

## 2014-04-25 NOTE — Assessment & Plan Note (Signed)
No complaints of chest pain

## 2014-04-25 NOTE — Patient Instructions (Signed)
Your physician has recommended you make the following change in your medication:  START HYDRALAZINE  ( 25 MG ) THREE TIMES A DAY INCREASE LASIX ( 80 MG ) TWICE A DAY  Your physician recommends that you have lab work today/bmet/bnp.   Your physician recommends that you keep your scheduled  follow-up appointment with Troy Husk, PA on April 11 @ 2:45.   Your physician recommends that you keep your  Scheduled follow-up with [name] on May 26 @ 9:15.  Low-Sodium Eating Plan Sodium raises blood pressure and causes water to be held in the body. Getting less sodium from food will help lower your blood pressure, reduce any swelling, and protect your heart, liver, and kidneys. We get sodium by adding salt (sodium chloride) to food. Most of our sodium comes from canned, boxed, and frozen foods. Restaurant foods, fast foods, and pizza are also very high in sodium. Even if you take medicine to lower your blood pressure or to reduce fluid in your body, getting less sodium from your food is important. WHAT IS MY PLAN? Most people should limit their sodium intake to 2,300 mg a day. Your health care provider recommends that you limit your sodium intake to __2 grams_ a day.  WHAT DO I NEED TO KNOW ABOUT THIS EATING PLAN? For the low-sodium eating plan, you will follow these general guidelines:  Choose foods with a % Daily Value for sodium of less than 5% (as listed on the food label).   Use salt-free seasonings or herbs instead of table salt or sea salt.   Check with your health care provider or pharmacist before using salt substitutes.   Eat fresh foods.  Eat more vegetables and fruits.  Limit canned vegetables. If you do use them, rinse them well to decrease the sodium.   Limit cheese to 1 oz (28 g) per day.   Eat lower-sodium products, often labeled as "lower sodium" or "no salt added."  Avoid foods that contain monosodium glutamate (MSG). MSG is sometimes added to [place] food  and some canned foods.  Check food labels (Nutrition Facts labels) on foods to learn how much sodium is in one serving.  Eat more home-cooked food and less restaurant, buffet, and fast food.  When eating at a restaurant, ask that your food be prepared with less salt or none, if possible.  HOW DO I READ FOOD LABELS FOR SODIUM INFORMATION? The Nutrition Facts label lists the amount of sodium in one serving of the food. If you eat more than one serving, you must multiply the listed amount of sodium by the number of servings. Food labels may also identify foods as:  Sodium free--Less than 5 mg in a serving.  Very low sodium--35 mg or less in a serving.  Low sodium--140 mg or less in a serving.  Light in sodium--50% less sodium in a serving. For example, if a food that usually has 300 mg of sodium is changed to become light in sodium, it will have 150 mg of sodium.  Reduced sodium--25% less sodium in a serving. For example, if a food that usually has 400 mg of sodium is changed to reduced sodium, it will have 300 mg of sodium. WHAT FOODS CAN I EAT? Grains Low-sodium cereals, including oats, puffed wheat and rice, and shredded wheat cereals. Low-sodium crackers. Unsalted rice and pasta. Lower-sodium bread.  Vegetables Frozen or fresh vegetables. Low-sodium or reduced-sodium canned vegetables. Low-sodium or reduced-sodium tomato sauce and paste. Low-sodium or reduced-sodium tomato and  vegetable juices.  Fruits Fresh, frozen, and canned fruit. Fruit juice.  Meat and Other Protein Products Low-sodium canned tuna and salmon. Fresh or frozen meat, poultry, seafood, and fish. Lamb. Unsalted nuts. Dried beans, peas, and lentils without added salt. Unsalted canned beans. Homemade soups without salt. Eggs.  Dairy Milk. Soy milk. Ricotta cheese. Low-sodium or reduced-sodium cheeses. Yogurt.  Condiments Fresh and dried herbs and spices. Salt-free seasonings. Onion and garlic powders.  Low-sodium varieties of mustard and ketchup. Lemon juice.  Fats and Oils Reduced-sodium salad dressings. Unsalted butter.  Other Unsalted popcorn and pretzels.  The items listed above may not be a complete list of recommended foods or beverages. Contact your dietitian for more options. WHAT FOODS ARE NOT RECOMMENDED? Grains Instant hot cereals. Bread stuffing, pancake, and biscuit mixes. Croutons. Seasoned rice or pasta mixes. Noodle soup cups. Boxed or frozen macaroni and cheese. Self-rising flour. Regular salted crackers. Vegetables Regular canned vegetables. Regular canned tomato sauce and paste. Regular tomato and vegetable juices. Frozen vegetables in sauces. Salted french fries. Olives. [name]. Relishes. Sauerkraut. Salsa. Meat and Other Protein Products Salted, canned, smoked, spiced, or pickled meats, seafood, or fish. Bacon, ham, sausage, hot dogs, corned beef, chipped beef, and packaged luncheon meats. Salt pork. Jerky. Pickled herring. Anchovies, regular canned tuna, and sardines. Salted nuts. Dairy Processed cheese and cheese spreads. Cheese curds. Blue cheese and cottage cheese. Buttermilk.  Condiments Onion and garlic salt, seasoned salt, table salt, and sea salt. Canned and packaged gravies. Worcestershire sauce. Tartar sauce. Barbecue sauce. Teriyaki sauce. Soy sauce, including reduced sodium. Steak sauce. Fish sauce. Oyster sauce. Cocktail sauce. Horseradish. Regular ketchup and mustard. Meat flavorings and tenderizers. Bouillon cubes. Hot sauce. Tabasco sauce. Marinades. Taco seasonings. Relishes. Fats and Oils Regular salad dressings. Salted butter. Margarine. Ghee. Bacon fat.  Other Potato and tortilla chips. Corn chips and puffs. Salted popcorn and pretzels. Canned or dried soups. Pizza. Frozen entrees and pot pies.  The items listed above may not be a complete list of foods and beverages to avoid. Contact your dietitian for more information. Document  Released: 06/29/2001 Document Revised: 01/12/2013 Document Reviewed: 11/11/2012 [facility] Patient Information 2015 [place], [place]. This information is not intended to replace advice given to you by your health care provider. Make sure you discuss any questions you have with your health care provider.

## 2014-04-25 NOTE — Progress Notes (Signed)
Cardiology Office Note   Date:  04/25/2014   ID:  ERUBEY SUPPES, DOB 1940/07/06, MRN [id number]  PCP:  Ileana Roup, MD  Cardiologist: Irish Lack EPS: Lovena Le  Chief Complaint:    History of Present Illness: Troy [name] is a 74 y.o. male who presents for post-emergency room follow-up. 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 He was recently seen in the emergency room with acute on chronic heart failure and 10 pound weight gain. His Demadex had been changed by primary care to Lasix. In the ER his Lasix was increased to 80 mg in the morning 40 mg in the afternoon.  Patient still has edema and dyspnea on exertion. He lives by himself and tries to follow low salt diet but does eat canned foods. His weight is 189 pounds today. It was 180 in November. There was 190 in the emergency room according to the patient. He uses his oxygen mostly at night but some during the day. He is not taking BiDil because he can't afford it.    Past Medical History  Diagnosis Date  . CAD (coronary artery disease) 1996    status post PCI of the RCA   . Ischemic dilated cardiomyopathy   . HTN (hypertension)   . GERD (gastroesophageal reflux disease)   . Personal history of DVT (deep vein thrombosis)   . Diabetes mellitus, type 2   . DJD (degenerative joint disease)   . Gout   . Prostatitis   . Nephrolithiasis   . Atrial fibrillation   . Congestive heart failure, unspecified   . Other primary cardiomyopathies   . COPD (chronic obstructive pulmonary disease)     Pt on home O2 at night and PRN, unsure of date of diagnosis.    Past Surgical History  Procedure Laterality Date  . Back surgery    . Coronary stent placement       Current Outpatient Prescriptions  Medication Sig Dispense Refill  .  acetaminophen (TYLENOL) 500 MG tablet Take 1,000 mg by mouth every 6 (six) hours as needed for mild pain or moderate pain.    Marland Kitchen albuterol (ACCUNEB) 1.25 MG/3ML nebulizer solution INHALE ONE VIAL VIA NEBULIZER EVERY 6 HOURS AS NEEDED FOR WHEEZING. 75 mL 5  . albuterol (PROVENTIL HFA;VENTOLIN HFA) 108 (90 BASE) MCG/ACT inhaler Inhale 2 puffs into the lungs every 4 (four) hours as needed for wheezing or shortness of breath. 1 Inhaler 11  . alfuzosin (UROXATRAL) 10 MG 24 hr tablet Take 10 mg by mouth daily.    Marland Kitchen allopurinol (ZYLOPRIM) 100 MG tablet Take 100 mg by mouth daily.    Marland Kitchen atorvastatin (LIPITOR) 10 MG tablet Take 1 tablet (10 mg total) by mouth at bedtime. 90 tablet 3  . carvedilol (COREG) 12.5 MG tablet Take 1 tablet (12.5 mg total) by mouth 2 (two) times daily with a meal. 60 tablet 1  . digoxin (LANOXIN) 0.125 MG tablet Take 1 tablet (125 mcg total) by mouth daily. 60 tablet 1  . fluticasone (FLONASE) 50 MCG/ACT nasal spray Place 1 spray into both nostrils 2 (two) times daily.    . furosemide (LASIX) 40 MG tablet 2 pills (80mg ) every morning and 1 pill (40mg ) every evening 60 tablet 0  . guaiFENesin-dextromethorphan (ROBITUSSIN DM) 100-10 MG/5ML syrup Take 5 mLs by mouth every 4 (  four) hours as needed for cough. 118 mL 0  . isosorbide-hydrALAZINE (BIDIL) 20-37.5 MG per tablet Take 1 tablet by mouth 2 (two) times daily. (Patient not taking: Reported on 04/21/2014) 60 tablet 6  . loratadine (CLARITIN) 10 MG tablet Take 10 mg by mouth daily. For allergies    . losartan (COZAAR) 100 MG tablet Take 100 mg by mouth daily.     . mometasone-formoterol (DULERA) 100-5 MCG/ACT AERO Inhale 2 puffs into the lungs 2 (two) times daily. (Patient not taking: Reported on 04/21/2014) 1 Inhaler 1  . omeprazole (PRILOSEC) 40 MG capsule Take 40 mg by mouth daily.    Marland Kitchen torsemide (DEMADEX) 20 MG tablet Take 1 tablet (20 mg total) by mouth 2 (two) times daily. (Patient not taking: Reported on 04/21/2014) 60 tablet 1  .  Umeclidinium-Vilanterol 62.5-25 MCG/INH AEPB Inhale 1-2 puffs into the lungs every evening.     . warfarin (COUMADIN) 1 MG tablet Take 2-3 mg by mouth See admin instructions. Takes 2 tablets on Monday, Tuesday and then take 3 tablets on all other days     No current facility-administered medications for this visit.    Allergies:   Benadryl    Social History:  The patient  reports that he quit smoking about 34 years ago. His smoking use included Cigarettes. He has a 200 pack-year smoking history. He has never used smokeless tobacco. He reports that he does not drink alcohol or use illicit drugs.   Family History:  The patient's    family history includes Cancer in his father; Stroke in his paternal uncle.    ROS:  Please see the history of present illness.   Otherwise, review of systems are positive for none.   All other systems are reviewed and negative.    PHYSICAL EXAM: BP 118/68 mmHg  Pulse 72  Ht 5\' 5"  (1.651 m)  Wt 189 lb 12.8 oz (86.093 kg)  BMI 31.58 kg/m2 GEN: Well nourished, well developed, in no acute distress Neck: no JVD, HJR, carotid bruits, or masses Cardiac:RRR; positive S4 and 1/6 systolic murmur at the left sternal border, no rubs, thrill or heave,   Respiratory:  Decreased breath sounds throughout clear to auscultation bilaterally,  GI: soft, nontender, nondistended, + BS MS: no deformity or atrophy Extremities: +2-3 edema right foot up to his knee +1-2 on his left foot to his knee otherwise lower extremities without cyanosis, clubbing, good distal pulses bilaterally.  Skin: warm and dry, no rash Neuro:  Strength and sensation are intact Psych: euthymic mood, full affect   EKG:  EKG is ordered today. The ekg ordered today demonstrates normal sinus rhythm with some paced rhythm Recent Labs: 11/21/2013: Pro B Natriuretic peptide (BNP) 14338.0* 11/25/2013: Magnesium 1.8 04/21/2014: ALT 11; B Natriuretic Peptide 1883.1*; BUN 11; Creatinine 1.46*; Hemoglobin 12.3*;  Platelets 114*; Potassium 3.7; Sodium 140    Lipid Panel No results found for: CHOL, TRIG, HDL, CHOLHDL, VLDL, LDLCALC, LDLDIRECT    Wt Readings from Last 3 Encounters:  04/21/14 190 lb (86.183 kg)  12/23/13 182 lb (82.555 kg)  12/14/13 180 lb 6.4 oz (81.829 kg)      Other studies Reviewed: Additional studies/ records that were reviewed today include and review of the records demonstrates:   2-D echo 11/2013  Study Conclusions  - Left ventricle: The cavity size was mildly dilated. Systolic   function was severely reduced. The estimated ejection fraction   was in the range of 20% to 25%. Diffuse hypokinesis. The study is  not technically sufficient to allow evaluation of LV diastolic   function. - Aortic valve: There was mild regurgitation. - Left atrium: The atrium was moderately dilated.     ASSESSMENT AND PLAN: Acute on chronic systolic CHF (congestive heart failure) Patient continues to struggle with heart failure. He is up approximately 9 pounds. I will increase his Lasix to 80 mg twice a day. Also prescribed hydralazine 25 mg 3 times a day. He was not taking BiDil because he couldn't afford it. Check BMP and BNP today. I will see him back next week. Follow-up with Dr.Varanasi in one month. 2 g sodium diet. He is advised to get a scale and weigh daily.   Atrial fibrillation Continue Coumadin   Coronary artery disease involving native coronary artery of native heart without angina pectoris No complaints of chest pain.      Sumner Boast, PA-C  04/25/2014 11:13 AM    [facility] [place], [place], [place]  [address] Phone: [phone]; Fax: [phone]

## 2014-04-25 NOTE — Assessment & Plan Note (Signed)
Patient continues to struggle with heart failure. He is up approximately 9 pounds. I will increase his Lasix to 80 mg twice a day. Also prescribed hydralazine 25 mg 3 times a day. He was not taking BiDil because he couldn't afford it. Check BMP and BNP today. I will see him back next week. Follow-up with Dr.Varanasi in one month. 2 g sodium diet. He is advised to get a scale and weigh daily.

## 2014-04-25 NOTE — Telephone Encounter (Signed)
Pt contacted for low potassium and verified medications.  Pt is taking 80 mg of Lasix BID and not taking torsemide.  Pt has not previously been on a potassium supplement.   Pt results reviewed with Flex NP. Ordered Potassium  10 mEq take two tablets today (4/4) and every day after that.  Pt agreed to pick up medication today and start taking as recommended  Pt schedule for BMET on 3/11 before follow up appointment.

## 2014-04-27 ENCOUNTER — Other Ambulatory Visit: Payer: Self-pay | Admitting: *Deleted

## 2014-04-27 MED ORDER — POTASSIUM CHLORIDE ER 10 MEQ PO TBCR: EXTENDED_RELEASE_TABLET | ORAL | Status: DC

## 2014-04-28 ENCOUNTER — Encounter: Payer: Self-pay | Admitting: *Deleted

## 2014-05-02 ENCOUNTER — Other Ambulatory Visit: Payer: Medicare HMO

## 2014-05-02 ENCOUNTER — Ambulatory Visit (INDEPENDENT_AMBULATORY_CARE_PROVIDER_SITE_OTHER): Payer: Medicare HMO | Admitting: Physician Assistant

## 2014-05-02 ENCOUNTER — Ambulatory Visit: Payer: Medicare HMO | Admitting: Physician Assistant

## 2014-05-02 ENCOUNTER — Other Ambulatory Visit: Payer: Self-pay | Admitting: *Deleted

## 2014-05-02 ENCOUNTER — Encounter: Payer: Self-pay | Admitting: Physician Assistant

## 2014-05-02 VITALS — BP 108/52 | HR 69 | Ht 65.0 in | Wt 185.0 lb

## 2014-05-02 DIAGNOSIS — N289 Disorder of kidney and ureter, unspecified: Secondary | ICD-10-CM | POA: Diagnosis not present

## 2014-05-02 DIAGNOSIS — N183 Chronic kidney disease, stage 3 unspecified: Secondary | ICD-10-CM

## 2014-05-02 DIAGNOSIS — I5023 Acute on chronic systolic (congestive) heart failure: Secondary | ICD-10-CM | POA: Diagnosis not present

## 2014-05-02 DIAGNOSIS — R0602 Shortness of breath: Secondary | ICD-10-CM

## 2014-05-02 DIAGNOSIS — I2510 Atherosclerotic heart disease of native coronary artery without angina pectoris: Secondary | ICD-10-CM

## 2014-05-02 DIAGNOSIS — I482 Chronic atrial fibrillation, unspecified: Secondary | ICD-10-CM

## 2014-05-02 DIAGNOSIS — I509 Heart failure, unspecified: Secondary | ICD-10-CM

## 2014-05-02 LAB — BASIC METABOLIC PANEL
BUN: 20 mg/dL (ref 6–23)
CALCIUM: 9.4 mg/dL (ref 8.4–10.5)
CO2: 34 mEq/L — ABNORMAL HIGH (ref 19–32)
Chloride: 101 mEq/L (ref 96–112)
Creatinine, Ser: 1.51 mg/dL — ABNORMAL HIGH (ref 0.40–1.50)
GFR: 48.21 mL/min — ABNORMAL LOW (ref >60.00)
Glucose, Bld: 85 mg/dL (ref 70–99)
Potassium: 3.9 mEq/L (ref 3.5–5.1)
Sodium: 141 mEq/L (ref 135–145)

## 2014-05-02 LAB — BRAIN NATRIURETIC PEPTIDE: PRO B NATRI PEPTIDE: 1499 pg/mL — AB (ref 0.0–100.0)

## 2014-05-02 MED ORDER — FUROSEMIDE 80 MG PO TABS: 80.0000 mg | ORAL_TABLET | Freq: Two times a day (BID) | ORAL | Status: DC

## 2014-05-02 NOTE — Assessment & Plan Note (Signed)
Patient continues to have fluid overload. He has lost 4 pounds since I saw him on 04/25/14. His edema has decreased but he still has some fluid on board. We'll check labs today and decide if we need to decrease his diuretics. Recommend getting a scale. Will check with insurance to see if they can help pay for it. I will see him back in 2 weeks and follow-up with [name] in 1 month.

## 2014-05-02 NOTE — Progress Notes (Signed)
Cardiology Office Note   Date:  05/02/2014   ID:  Troy [name], DOB Dec 26, 1940, MRN [id number]  PCP:  Ileana Roup, MD  Cardiologist: Irish Lack EPS: Lovena Le  Chief Complaint: Short of breath    History of Present Illness: Troy [name] is a 74 y.o. male who presents for two-week follow-up. I saw him for  post-emergency room follow-up on 04/25/14. 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 He was recently seen in the emergency room with acute on chronic heart failure and 10 pound weight gain. His Demadex had been changed by primary care to Lasix. In the ER his Lasix was increased to 80 mg in the morning 40 mg in the afternoon.  Patient still had edema and dyspnea on exertion. He lives by himself and tries to follow low salt diet but does eat canned foods. His weight was189 pounds on 04/25/14. . It was 180 in November and 190 lbs. in the emergency room according to the patient. He uses his oxygen mostly at night but some during the day. He is not taking BiDil because he can't afford it.  I increased his Lasix to 80 mg twice a day and prescribed hydralazine 25 mg 3 times a day. Blood work on 04/25/14 BNP was elevated 2433 potassium was 3.3 BUN 17 creatinine 1.39. Potassium was increased to 20 mEq twice a day.  Today patient is feeling better and has lost 4 pounds. He still has some edema but overall has improved. He still has dyspnea on exertion. He can't afford to buy a scale.      Past Medical History  Diagnosis Date  . CAD (coronary artery disease) 1996    status post PCI of the RCA   . Ischemic dilated cardiomyopathy   . HTN (hypertension)   . GERD (gastroesophageal reflux disease)   . Personal history of DVT (deep vein thrombosis)   . Diabetes mellitus, type 2   . DJD (degenerative  joint disease)   . Gout   . Prostatitis   . Nephrolithiasis   . Atrial fibrillation   . Congestive heart failure, unspecified   . Other primary cardiomyopathies   . COPD (chronic obstructive pulmonary disease)     Pt on home O2 at night and PRN, unsure of date of diagnosis.    Past Surgical History  Procedure Laterality Date  . Back surgery    . Coronary stent placement       Current Outpatient Prescriptions  Medication Sig Dispense Refill  . acetaminophen (TYLENOL) 500 MG tablet Take 1,000 mg by mouth every 6 (six) hours as needed for mild pain or moderate pain.    Marland Kitchen albuterol (ACCUNEB) 1.25 MG/3ML nebulizer solution INHALE ONE VIAL VIA NEBULIZER EVERY 6 HOURS AS NEEDED FOR WHEEZING. 75 mL 5  . albuterol (PROVENTIL HFA;VENTOLIN HFA) 108 (90 BASE) MCG/ACT inhaler Inhale 2 puffs into the lungs every 4 (four) hours as needed for wheezing or shortness of breath. 1 Inhaler 11  . alfuzosin (UROXATRAL) 10 MG 24 hr tablet Take 10 mg by mouth daily.    Marland Kitchen allopurinol (ZYLOPRIM) 100 MG tablet Take 100 mg by mouth daily.    Marland Kitchen atorvastatin (LIPITOR) 10 MG tablet Take 1 tablet (10 mg total) by mouth at bedtime. 90 tablet 3  . carvedilol (COREG) 12.5 MG tablet Take  1 tablet (12.5 mg total) by mouth 2 (two) times daily with a meal. 60 tablet 1  . digoxin (LANOXIN) 0.125 MG tablet Take 1 tablet (125 mcg total) by mouth daily. 60 tablet 1  . fluticasone (FLONASE) 50 MCG/ACT nasal spray Place 1 spray into both nostrils 2 (two) times daily.    . furosemide (LASIX) 80 MG tablet Take 1 tablet (80 mg total) by mouth 2 (two) times daily. (Patient not taking: Reported on 04/25/2014) 60 tablet 6  . furosemide (LASIX) 80 MG tablet Take 1 tablet (80 mg total) by mouth 2 (two) times daily. 60 tablet 6  . guaiFENesin-dextromethorphan (ROBITUSSIN DM) 100-10 MG/5ML syrup Take 5 mLs by mouth every 4 (four) hours as needed for cough. 118 mL 0  . hydrALAZINE (APRESOLINE) 25 MG tablet Take 1 tablet (25 mg total) by mouth  3 (three) times daily. 90 tablet 6  . loratadine (CLARITIN) 10 MG tablet Take 10 mg by mouth daily. For allergies    . losartan (COZAAR) 100 MG tablet Take 100 mg by mouth daily.     . mometasone-formoterol (DULERA) 100-5 MCG/ACT AERO Inhale 2 puffs into the lungs 2 (two) times daily. 1 Inhaler 1  . omeprazole (PRILOSEC) 40 MG capsule Take 40 mg by mouth daily.    . potassium chloride (K-DUR) 10 MEQ tablet Take 2 tablets (20 meq) twice a day x 1 week until labs done 4/11. 90 tablet 3  . torsemide (DEMADEX) 20 MG tablet Take 1 tablet (20 mg total) by mouth 2 (two) times daily. (Patient not taking: Reported on 04/25/2014) 60 tablet 1  . Umeclidinium-Vilanterol 62.5-25 MCG/INH AEPB Inhale 1-2 puffs into the lungs every evening.     . warfarin (COUMADIN) 1 MG tablet Take 2-3 mg by mouth See admin instructions. Takes 2 tablets on Monday, Tuesday and then take 3 tablets on all other days     No current facility-administered medications for this visit.    Allergies:   Benadryl    Social History:  The patient  reports that he quit smoking about 34 years ago. His smoking use included Cigarettes. He has a 200 pack-year smoking history. He has never used smokeless tobacco. He reports that he does not drink alcohol or use illicit drugs.   Family History:  The patient's    family history includes Colon cancer in his father; Stroke in his paternal uncle.    ROS:  Please see the history of present illness.   Otherwise, review of systems are positive for none.   All other systems are reviewed and negative.    PHYSICAL EXAM: BP 108/52 mmHg  Pulse 69  Ht 5\' 5"  (1.651 m)  Wt 185 lb (83.915 kg)  BMI 30.79 kg/m2  SpO2 96%  GEN: Obese, well developed, in no acute distress Neck: no JVD, HJR, carotid bruits, or masses Cardiac:RRR; positive S4 and 1/6 systolic murmur at the left sternal border, no rubs, thrill or heave,   Respiratory:  Decreased breath sounds throughout clear to auscultation bilaterally,   GI: soft, nontender, nondistended, + BS MS: no deformity or atrophy Extremities: + 1-2 edema right foot up to his knee +1 on his left foot to his shin otherwise lower extremities without cyanosis, clubbing, good distal pulses bilaterally.  Skin: warm and dry, no rash Neuro:  Strength and sensation are intact Psych: euthymic mood, full affect   EKG:  EKG is not ordered today.  Recent Labs: 11/25/2013: Magnesium 1.8 04/21/2014: ALT 11; B Natriuretic Peptide 1883.1*;  Hemoglobin 12.3*; Platelets 114* 04/25/2014: BUN 17; Creatinine 1.39; Potassium 3.3*; Pro B Natriuretic peptide (BNP) 2433.0*; Sodium 140    Lipid Panel No results found for: CHOL, TRIG, HDL, CHOLHDL, VLDL, LDLCALC, LDLDIRECT    Wt Readings from Last 3 Encounters:  04/25/14 189 lb 12.8 oz (86.093 kg)  04/21/14 190 lb (86.183 kg)  12/23/13 182 lb (82.555 kg)      Other studies Reviewed: Additional studies/ records that were reviewed today include and review of the records demonstrates:   2-D echo 11/2013  Study Conclusions  - Left ventricle: The cavity size was mildly dilated. Systolic   function was severely reduced. The estimated ejection fraction   was in the range of 20% to 25%. Diffuse hypokinesis. The study is   not technically sufficient to allow evaluation of LV diastolic   function. - Aortic valve: There was mild regurgitation. - Left atrium: The atrium was moderately dilated.     ASSESSMENT AND PLAN: Acute on chronic systolic CHF (congestive heart failure) Patient continues to have fluid overload. He has lost 4 pounds since I saw him on 04/25/14. His edema has decreased but he still has some fluid on board. We'll check labs today and decide if we need to decrease his diuretics. Recommend getting a scale. Will check with insurance to see if they can help pay for it. I will see him back in 2 weeks and follow-up with [name] in 1 month.   Atrial fibrillation Rate controlled on Coreg and digitoxin,  continue Coumadin   Coronary artery disease involving native coronary artery of native heart without angina pectoris No chest pain.   Chronic kidney disease, stage 3 Follow-up renal function today on increased diuretics.      Sumner Boast, PA-C  05/02/2014 10:22 AM    [facility] [place], [place], [place]  [address] Phone: [phone]; Fax: [phone]

## 2014-05-02 NOTE — Patient Instructions (Addendum)
Medication Instructions:   Your physician recommends that you continue on your current medications as directed. Please refer to the Current Medication list given to you today.   Labwork: LABS TODAY BMET AND BNP   Testing/Procedures: NONE TODAY   Follow-Up:  FOLLOW UP WITH LENZE IN 2 TO3 WEEKS   FOLLOW P WITH DR Irish Lack IN 2 TO 3 MONTHS   Any Other Special Instructions Will Be Listed Below (If Applicable).

## 2014-05-02 NOTE — Assessment & Plan Note (Signed)
Follow-up renal function today on increased diuretics.

## 2014-05-02 NOTE — Assessment & Plan Note (Signed)
No chest pain

## 2014-05-02 NOTE — Assessment & Plan Note (Signed)
Rate controlled on Coreg and digitoxin, continue Coumadin

## 2014-05-03 NOTE — Telephone Encounter (Signed)
Dr. Byrum - please advise. Thanks. 

## 2014-05-10 NOTE — Telephone Encounter (Signed)
RB please advise. Thanks.  

## 2014-05-13 ENCOUNTER — Encounter: Payer: Medicare HMO | Admitting: Physician Assistant

## 2014-05-13 NOTE — Telephone Encounter (Signed)
Patient called on 3/28 regarding his Anoro.  He cannot afford this medication any longer and needs a cheaper alternative.  Dr. Lamonte Sakai, please advise at your earliest convenience.

## 2014-05-16 ENCOUNTER — Other Ambulatory Visit: Payer: Medicare HMO

## 2014-05-16 NOTE — Telephone Encounter (Signed)
Attempted to contact pt. No answer. Will try back. 

## 2014-05-16 NOTE — Telephone Encounter (Signed)
We could change to Spiriva qd. Try this - if the cost is the same, I would prefer that he be on the Anoro or another combination medication.

## 2014-05-17 NOTE — Telephone Encounter (Signed)
ATC pt. Line rang several times without VM. WCB.

## 2014-05-18 NOTE — Telephone Encounter (Signed)
Attempted to call pt. No answer, no option to leave a message. Will try back. 

## 2014-05-19 NOTE — Telephone Encounter (Signed)
ATC, rang several times, na wcb

## 2014-05-20 NOTE — Telephone Encounter (Signed)
atc pt, line rang several times with no answer.  Will close per triage protocol.

## 2014-05-30 ENCOUNTER — Ambulatory Visit (INDEPENDENT_AMBULATORY_CARE_PROVIDER_SITE_OTHER): Payer: Medicare HMO | Admitting: Physician Assistant

## 2014-05-30 ENCOUNTER — Encounter: Payer: Self-pay | Admitting: Internal Medicine

## 2014-05-30 ENCOUNTER — Encounter: Payer: Self-pay | Admitting: Physician Assistant

## 2014-05-30 ENCOUNTER — Telehealth: Payer: Self-pay | Admitting: Emergency Medicine

## 2014-05-30 ENCOUNTER — Ambulatory Visit: Admission: RE | Admit: 2014-05-30 | Payer: Medicare HMO | Source: Ambulatory Visit

## 2014-05-30 VITALS — BP 120/58 | HR 67 | Ht 65.0 in | Wt 174.8 lb

## 2014-05-30 DIAGNOSIS — I5022 Chronic systolic (congestive) heart failure: Secondary | ICD-10-CM

## 2014-05-30 DIAGNOSIS — N183 Chronic kidney disease, stage 3 unspecified: Secondary | ICD-10-CM

## 2014-05-30 DIAGNOSIS — I2510 Atherosclerotic heart disease of native coronary artery without angina pectoris: Secondary | ICD-10-CM

## 2014-05-30 DIAGNOSIS — I509 Heart failure, unspecified: Secondary | ICD-10-CM

## 2014-05-30 DIAGNOSIS — I482 Chronic atrial fibrillation, unspecified: Secondary | ICD-10-CM

## 2014-05-30 LAB — BASIC METABOLIC PANEL
BUN: 24 mg/dL — ABNORMAL HIGH (ref 6–23)
CHLORIDE: 101 meq/L (ref 96–112)
CO2: 30 mEq/L (ref 19–32)
CREATININE: 1.55 mg/dL — AB (ref 0.40–1.50)
Calcium: 9.7 mg/dL (ref 8.4–10.5)
GFR: 46.77 mL/min — ABNORMAL LOW (ref >60.00)
Glucose, Bld: 86 mg/dL (ref 70–99)
Potassium: 3.9 mEq/L (ref 3.5–5.1)
SODIUM: 136 meq/L (ref 135–145)

## 2014-05-30 NOTE — Telephone Encounter (Signed)
Need to figure out why, and whether we are going to reschedule

## 2014-05-30 NOTE — Telephone Encounter (Signed)
Spoke with Marzetta Board with CT, states that pt was seeing cardiologist today and was also scheduled for a CT chest- pt saw cardiologist but refused to stay for CT, even after being told that this was ordered and scheduled from back in December to follow up from his CT in November.  Forwarding to [place] just as fyi.

## 2014-05-30 NOTE — Assessment & Plan Note (Signed)
Last creatinine 1.5. We will check labs today and may need to adjust his diuretics down. He will call tomorrow for the results.

## 2014-05-30 NOTE — Progress Notes (Signed)
Cardiology Office Note   Date:  05/30/2014   ID:  Troy [name], DOB Mar 04, 1940, MRN [id number]  PCP:  Barbette Merino, MD  Cardiologist: Irish Lack EPS: Lovena Le  Chief Complaint: Short of breath    History of Present Illness: Troy [name] is a 74 y.o. male who presents for 1 month follow-up. I saw him for  post-emergency room follow-up on 04/25/14 and again on 05/02/14. 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 He was recently seen in the emergency room with acute on chronic heart failure and 10 pound weight gain. His Demadex had been changed by primary care to Lasix. In the ER his Lasix was increased to 80 mg in the morning 40 mg in the afternoon.  Patient still had edema and dyspnea on exertion. He lives by himself and tries to follow low salt diet but does eat canned foods. His weight was189 pounds on 04/25/14. . It was 180 in November and 190 lbs. in the emergency room according to the patient. He uses his oxygen mostly at night but some during the day. He is not taking BiDil because he can't afford it.  I increased his Lasix to 80 mg twice a day and prescribed hydralazine 25 mg 3 times a day. Blood work on 04/25/14 BNP was elevated 2433 potassium was 3.3 BUN 17 creatinine 1.39. Potassium was increased to 20 mEq twice a day. She had lost 4 pounds but still had edema. His creatinine went up to 1.5. He is difficult to get a hold of and does not answer his phone. He is currently taking 80 mg of Lasix in the morning 40 in the evening. His weight is down to 174 pounds. He feels much better. He denies any edema, chest pain, dizziness or presyncope. He has chronic dyspnea on exertion and he uses oxygen at night.     Past Medical History  Diagnosis Date  . CAD (coronary artery disease) 1996    status post PCI  of the RCA   . Ischemic dilated cardiomyopathy   . HTN (hypertension)   . GERD (gastroesophageal reflux disease)   . Personal history of DVT (deep vein thrombosis)   . Diabetes mellitus, type 2   . DJD (degenerative joint disease)   . Gout   . Prostatitis   . Nephrolithiasis   . Atrial fibrillation   . Congestive heart failure, unspecified   . Other primary cardiomyopathies   . COPD (chronic obstructive pulmonary disease)     Pt on home O2 at night and PRN, unsure of date of diagnosis.    Past Surgical History  Procedure Laterality Date  . Back surgery    . Coronary stent placement       Current Outpatient Prescriptions  Medication Sig Dispense Refill  . acetaminophen (TYLENOL) 500 MG tablet Take 1,000 mg by mouth every 6 (six) hours as needed for mild pain or moderate pain.    Marland Kitchen albuterol (ACCUNEB) 1.25 MG/3ML nebulizer solution INHALE ONE VIAL VIA NEBULIZER EVERY 6 HOURS AS NEEDED FOR WHEEZING. 75 mL 5  . albuterol (PROVENTIL HFA;VENTOLIN HFA) 108 (90 BASE) MCG/ACT inhaler Inhale 2 puffs into the lungs every 4 (four) hours as needed for wheezing or shortness of breath. 1 Inhaler 11  . alfuzosin (UROXATRAL) 10 MG 24 hr tablet Take 10 mg by mouth daily.    Marland Kitchen  allopurinol (ZYLOPRIM) 100 MG tablet Take 100 mg by mouth daily.    Marland Kitchen atorvastatin (LIPITOR) 10 MG tablet Take 1 tablet (10 mg total) by mouth at bedtime. 90 tablet 3  . carvedilol (COREG) 12.5 MG tablet Take 1 tablet (12.5 mg total) by mouth 2 (two) times daily with a meal. 60 tablet 1  . digoxin (LANOXIN) 0.125 MG tablet Take 1 tablet (125 mcg total) by mouth daily. 60 tablet 1  . fluticasone (FLONASE) 50 MCG/ACT nasal spray Place 1 spray into both nostrils 2 (two) times daily.    . furosemide (LASIX) 80 MG tablet Take 1 tablet (80 mg total) by mouth 2 (two) times daily. Starting Wednesday April 13, Take (80 mg ) am (40 mg ) pm 60 tablet 6  . guaiFENesin-dextromethorphan (ROBITUSSIN DM) 100-10 MG/5ML syrup Take 5 mLs by  mouth every 4 (four) hours as needed for cough. 118 mL 0  . hydrALAZINE (APRESOLINE) 25 MG tablet Take 1 tablet (25 mg total) by mouth 3 (three) times daily. 90 tablet 6  . loratadine (CLARITIN) 10 MG tablet Take 10 mg by mouth daily. For allergies    . losartan (COZAAR) 100 MG tablet Take 100 mg by mouth daily.     . mometasone-formoterol (DULERA) 100-5 MCG/ACT AERO Inhale 2 puffs into the lungs 2 (two) times daily. 1 Inhaler 1  . omeprazole (PRILOSEC) 40 MG capsule Take 40 mg by mouth daily.    . potassium chloride (K-DUR) 10 MEQ tablet Take 2 tablets (20 meq) twice a day x 1 week until labs done 4/11. 90 tablet 3  . Umeclidinium-Vilanterol 62.5-25 MCG/INH AEPB Inhale 1-2 puffs into the lungs every evening.     . warfarin (COUMADIN) 1 MG tablet Take 2-3 mg by mouth See admin instructions. Takes 2 tablets on Monday, Tuesday and then take 3 tablets on all other days     No current facility-administered medications for this visit.    Allergies:   Benadryl    Social History:  The patient  reports that he quit smoking about 34 years ago. His smoking use included Cigarettes. He has a 200 pack-year smoking history. He has never used smokeless tobacco. He reports that he does not drink alcohol or use illicit drugs.   Family History:  The patient's    family history includes Cancer in his father; Colon cancer in his mother; Heart disease in his father; Stroke in his paternal uncle.    ROS:  Please see the history of present illness.   Otherwise, review of systems are positive for none.   All other systems are reviewed and negative.    PHYSICAL EXAM: BP 120/58 mmHg  Pulse 67  Ht 5\' 5"  (1.651 m)  Wt 174 lb 12.8 oz (79.289 kg)  BMI 29.09 kg/m2  GEN: Obese, well developed, in no acute distress Neck: no JVD, HJR, carotid bruits, or masses Cardiac:RRR; positive S4 and 1/6 systolic murmur at the left sternal border, no rubs, thrill or heave,   Respiratory:  Decreased breath sounds throughout  clear to auscultation bilaterally,  GI: soft, nontender, nondistended, + BS MS: no deformity or atrophy Extremities:  Edema resolved, without cyanosis, clubbing, good distal pulses bilaterally.  Skin: warm and dry, no rash Neuro:  Strength and sensation are intact Psych: euthymic mood, full affect   EKG:  EKG is not ordered today.  Recent Labs: 11/25/2013: Magnesium 1.8 04/21/2014: ALT 11; B Natriuretic Peptide 1883.1*; Hemoglobin 12.3*; Platelets 114* 05/02/2014: BUN 20; Creatinine 1.51*; Potassium 3.9;  Pro B Natriuretic peptide (BNP) 1499.0*; Sodium 141    Lipid Panel No results found for: CHOL, TRIG, HDL, CHOLHDL, VLDL, LDLCALC, LDLDIRECT    Wt Readings from Last 3 Encounters:  05/30/14 174 lb 12.8 oz (79.289 kg)  05/02/14 185 lb (83.915 kg)  04/25/14 189 lb 12.8 oz (86.093 kg)      Other studies Reviewed: Additional studies/ records that were reviewed today include and review of the records demonstrates:   2-D echo 11/2013  Study Conclusions  - Left ventricle: The cavity size was mildly dilated. Systolic   function was severely reduced. The estimated ejection fraction   was in the range of 20% to 25%. Diffuse hypokinesis. The study is   not technically sufficient to allow evaluation of LV diastolic   function. - Aortic valve: There was mild regurgitation. - Left atrium: The atrium was moderately dilated.     ASSESSMENT AND PLAN:  Chronic systolic CHF (congestive heart failure) Patient has chronic systolic heart failure. His weight is down to 174 which is the lowest it has been. We will check kidney function today. He may need his diuretics decreased. I've asked him to call us tomorrow for the results so we can adjust his Lasix. He does not answer his phone, does not have a answering machine and does not carry a cell phone. He has an appointment Dr.Varanasi at the end of this month.   Atrial fibrillation Right controlled on Coreg and Coumadin   Coronary artery  disease involving native coronary artery of native heart without angina pectoris Stable without chest pain   Chronic kidney disease, stage 3 Last creatinine 1.5. We will check labs today and may need to adjust his diuretics down. He will call tomorrow for the results.      Signed, Ermalinda Barrios, PA-C  05/30/2014 1:16 PM    [facility] [address], [place], [place]  [address] Phone: [phone]; Fax: [phone]

## 2014-05-30 NOTE — Telephone Encounter (Signed)
Attempted to contact pt. Line was busy. Will try back. 

## 2014-05-30 NOTE — Assessment & Plan Note (Signed)
Right controlled on Coreg and Coumadin

## 2014-05-30 NOTE — Patient Instructions (Addendum)
Medication Instructions:   Your physician recommends that you continue on your current medications as directed. Please refer to the Current Medication list given to you today.  Labwork:  BMET   Testing/Procedures:   Follow-Up:    Any Other Special Instructions Will Be Listed Below (If Applicable).  PLEASE CONTACT OFFICE [phone] ASK TO SPEAK TO  [facility]

## 2014-05-30 NOTE — Assessment & Plan Note (Signed)
Patient has chronic systolic heart failure. His weight is down to 174 which is the lowest it has been. We will check kidney function today. He may need his diuretics decreased. I've asked him to call us tomorrow for the results so we can adjust his Lasix. He does not answer his phone, does not have a answering machine and does not carry a cell phone. He has an appointment Dr.Varanasi at the end of this month.

## 2014-05-30 NOTE — Assessment & Plan Note (Signed)
Stable without chest pain 

## 2014-05-31 ENCOUNTER — Telehealth: Payer: Self-pay | Admitting: Interventional Cardiology

## 2014-05-31 NOTE — Telephone Encounter (Signed)
Attempted to call pt back got a busy signal

## 2014-05-31 NOTE — Telephone Encounter (Signed)
Attempted to call pt back but phone rang several times with no answer and no voicemail. Will try again later.

## 2014-05-31 NOTE — Telephone Encounter (Signed)
ATC pt. Line rang several times without VM. WCB.

## 2014-05-31 NOTE — Telephone Encounter (Signed)
Follow Up ° °Pt returned call//  °

## 2014-06-01 ENCOUNTER — Other Ambulatory Visit: Payer: Self-pay | Admitting: *Deleted

## 2014-06-01 ENCOUNTER — Telehealth: Payer: Self-pay | Admitting: *Deleted

## 2014-06-01 MED ORDER — FUROSEMIDE 80 MG PO TABS: 80.0000 mg | ORAL_TABLET | Freq: Every day | ORAL | Status: DC

## 2014-06-01 NOTE — Telephone Encounter (Signed)
Attempted to contact pt again today. No answer, no option to leave a message. Will try back.

## 2014-06-01 NOTE — Telephone Encounter (Signed)
-----   Message from Imogene Burn, PA-C sent at 06/01/2014  7:58 AM EDT ----- Edwena Blow, I never received these results. Do you know who they were sent to? Please try to contact him and have him decrease his Lasix to 80 mg once daily.  Thanks, Selinda Eon

## 2014-06-02 NOTE — Telephone Encounter (Signed)
Attempted to call pt back and phone rang several times with no answer and no voicemail. Unable to leave message. Original message states that pt was returning call- appears that pulmonology is who was actually calling him, not cardiology.  Will close this encounter and wait for pt to call back.

## 2014-06-02 NOTE — Telephone Encounter (Signed)
Attempted to contact patient, no answer, no voicemail, will try to call again.

## 2014-06-03 NOTE — Telephone Encounter (Signed)
No answer, no voicemail.  Letter sent to patient advising him that we have been trying to reach him and to contact our office.

## 2014-06-16 ENCOUNTER — Ambulatory Visit (INDEPENDENT_AMBULATORY_CARE_PROVIDER_SITE_OTHER): Payer: Medicare HMO | Admitting: *Deleted

## 2014-06-16 ENCOUNTER — Encounter: Payer: Self-pay | Admitting: Internal Medicine

## 2014-06-16 ENCOUNTER — Ambulatory Visit (INDEPENDENT_AMBULATORY_CARE_PROVIDER_SITE_OTHER): Payer: Medicare HMO | Admitting: Interventional Cardiology

## 2014-06-16 ENCOUNTER — Encounter: Payer: Self-pay | Admitting: Interventional Cardiology

## 2014-06-16 VITALS — BP 138/60 | HR 60 | Ht 65.0 in | Wt 176.4 lb

## 2014-06-16 DIAGNOSIS — I2510 Atherosclerotic heart disease of native coronary artery without angina pectoris: Secondary | ICD-10-CM

## 2014-06-16 DIAGNOSIS — I5022 Chronic systolic (congestive) heart failure: Secondary | ICD-10-CM

## 2014-06-16 DIAGNOSIS — I482 Chronic atrial fibrillation, unspecified: Secondary | ICD-10-CM

## 2014-06-16 DIAGNOSIS — Z7901 Long term (current) use of anticoagulants: Secondary | ICD-10-CM

## 2014-06-16 DIAGNOSIS — N183 Chronic kidney disease, stage 3 unspecified: Secondary | ICD-10-CM

## 2014-06-16 DIAGNOSIS — I5023 Acute on chronic systolic (congestive) heart failure: Secondary | ICD-10-CM

## 2014-06-16 LAB — CUP PACEART INCLINIC DEVICE CHECK
Battery Remaining Longevity: 49.2 mo
Brady Statistic RA Percent Paced: 64 %
HighPow Impedance: 49.063
Lead Channel Impedance Value: 425 Ohm
Lead Channel Pacing Threshold Amplitude: 0.75 V
Lead Channel Pacing Threshold Amplitude: 1 V
Lead Channel Pacing Threshold Pulse Width: 0.5 ms
Lead Channel Sensing Intrinsic Amplitude: 5 mV
Lead Channel Setting Pacing Amplitude: 2 V
Lead Channel Setting Pacing Pulse Width: 0.5 ms
Lead Channel Setting Pacing Pulse Width: 0.5 ms
Lead Channel Setting Sensing Sensitivity: 0.5 mV
MDC IDC MSMT LEADCHNL LV IMPEDANCE VALUE: 900 Ohm
MDC IDC MSMT LEADCHNL LV PACING THRESHOLD PULSEWIDTH: 0.5 ms
MDC IDC MSMT LEADCHNL RA PACING THRESHOLD AMPLITUDE: 0.75 V
MDC IDC MSMT LEADCHNL RA PACING THRESHOLD PULSEWIDTH: 0.5 ms
MDC IDC MSMT LEADCHNL RV IMPEDANCE VALUE: 487.5 Ohm
MDC IDC MSMT LEADCHNL RV SENSING INTR AMPL: 12 mV
MDC IDC PG SERIAL: 7001284
MDC IDC SESS DTM: 20160526094321
MDC IDC SET LEADCHNL RA PACING AMPLITUDE: 1.75 V
MDC IDC SET LEADCHNL RV PACING AMPLITUDE: 2 V
MDC IDC SET ZONE DETECTION INTERVAL: 330 ms
MDC IDC STAT BRADY RV PERCENT PACED: 85 %
Pulse Gen Model: 3265

## 2014-06-16 LAB — BASIC METABOLIC PANEL
BUN: 22 mg/dL (ref 6–23)
CHLORIDE: 101 meq/L (ref 96–112)
CO2: 31 meq/L (ref 19–32)
CREATININE: 1.67 mg/dL — AB (ref 0.40–1.50)
Calcium: 9.4 mg/dL (ref 8.4–10.5)
GFR: 42.91 mL/min — AB (ref >60.00)
GLUCOSE: 95 mg/dL (ref 70–99)
POTASSIUM: 4.2 meq/L (ref 3.5–5.1)
SODIUM: 138 meq/L (ref 135–145)

## 2014-06-16 LAB — PROTIME-INR
INR: 1.9 ratio — ABNORMAL HIGH (ref 0.8–1.0)
PROTHROMBIN TIME: 20.3 s — AB (ref 9.6–13.1)

## 2014-06-16 NOTE — Patient Instructions (Addendum)
Medication Instructions:  Same-no change   Labwork: Today (BMET and INR)  Testing/Procedures: None   Follow-Up: Your physician recommends that you schedule a follow-up appointment in: 3 months for device check (Dr. Lovena Le). Change location to [facility] office. Your physician wants you to follow-up in: 6 months with Dr Irish Lack. You will receive a reminder letter in the mail two months in advance. If you don't receive a letter, please call our office to schedule the follow-up appointment.    Any Other Special Instructions Will Be Listed Below (If Applicable).

## 2014-06-16 NOTE — Progress Notes (Signed)
Patient ID: Troy [name], male   DOB: Nov 02, 1940, 74 y.o.   MRN: [id number]     Cardiology Office Note   Date:  06/16/2014   ID:  Troy [name], DOB September 17, 1940, MRN [id number]  PCP:  Barbette Merino, MD    No chief complaint on file. Chronic systolic heart failure   Wt Readings from Last 3 Encounters:  06/16/14 176 lb 6.4 oz (80.015 kg)  05/30/14 174 lb 12.8 oz (79.289 kg)  05/02/14 185 lb (83.915 kg)       History of Present Illness: Troy [name] is a 74 y.o. male  who has a low LVEF and CAD, PCI of RCA in 1996. He had some fluid overload and diuretics were adjusted in 4/16 to 2 pills, then back to one pill daily. No more pain in his neck. No SHOB lying flat. No significant lower extremity swelling. No significant change in his weight, but his scale broke recently. 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 No bleeding problems.No longer Using supplemental oxygen.  Cardiomyopathy:  denies Dizziness  Denies : Chest pain.  Leg edema.  Orthopnea.  Paroxysmal nocturnal dyspnea.  Palpitations.  Syncope.      Past Medical History  Diagnosis Date  . CAD (coronary artery disease) 1996    status post PCI of the RCA   . Ischemic dilated cardiomyopathy   . HTN (hypertension)   . GERD (gastroesophageal reflux disease)   . Personal history of DVT (deep vein thrombosis)   . Diabetes mellitus, type 2   . DJD (degenerative joint disease)   . Gout   . Prostatitis   . Nephrolithiasis   . Atrial fibrillation   . Congestive heart failure, unspecified   . Other primary cardiomyopathies   . COPD (chronic obstructive pulmonary disease)     Pt on home O2 at night and PRN, unsure of date of diagnosis.    Past Surgical History  Procedure Laterality Date  . Back surgery    . Coronary stent placement       Current Outpatient Prescriptions  Medication Sig Dispense Refill  . acetaminophen (TYLENOL) 500 MG tablet Take 1,000 mg by mouth every 6 (six) hours as needed for  mild pain or moderate pain.    Marland Kitchen albuterol (ACCUNEB) 1.25 MG/3ML nebulizer solution INHALE ONE VIAL VIA NEBULIZER EVERY 6 HOURS AS NEEDED FOR WHEEZING. 75 mL 5  . albuterol (PROVENTIL HFA;VENTOLIN HFA) 108 (90 BASE) MCG/ACT inhaler Inhale 2 puffs into the lungs every 4 (four) hours as needed for wheezing or shortness of breath. 1 Inhaler 11  . alfuzosin (UROXATRAL) 10 MG 24 hr tablet Take 10 mg by mouth daily.    Marland Kitchen allopurinol (ZYLOPRIM) 100 MG tablet Take 100 mg by mouth daily.    Marland Kitchen atorvastatin (LIPITOR) 10 MG tablet Take 1 tablet (10 mg total) by mouth at bedtime. 90 tablet 3  . carvedilol (COREG) 12.5 MG tablet Take 1 tablet (12.5 mg total) by mouth 2 (two) times daily with a meal. 60 tablet 1  . digoxin (LANOXIN) 0.125 MG tablet Take 1 tablet (125 mcg total) by mouth daily. 60 tablet 1  . fluticasone (FLONASE) 50 MCG/ACT nasal spray Place 1 spray into both nostrils 2 (two) times daily.    . furosemide (LASIX) 80 MG tablet Take 1 tablet (80 mg total) by mouth daily. 60 tablet 6  . guaiFENesin-dextromethorphan (ROBITUSSIN DM) 100-10 MG/5ML syrup Take 5 mLs by mouth every 4 (four) hours as needed for cough. [place]  mL 0  . hydrALAZINE (APRESOLINE) 25 MG tablet Take 1 tablet (25 mg total) by mouth 3 (three) times daily. 90 tablet 6  . loratadine (CLARITIN) 10 MG tablet Take 10 mg by mouth daily. For allergies    . losartan (COZAAR) 100 MG tablet Take 100 mg by mouth daily.     . mometasone-formoterol (DULERA) 100-5 MCG/ACT AERO Inhale 2 puffs into the lungs 2 (two) times daily. 1 Inhaler 1  . omeprazole (PRILOSEC) 40 MG capsule Take 40 mg by mouth daily.    . potassium chloride (K-DUR) 10 MEQ tablet Take 2 tablets (20 meq) twice a day x 1 week until labs done 4/11. 90 tablet 3  . Umeclidinium-Vilanterol 62.5-25 MCG/INH AEPB Inhale 1-2 puffs into the lungs every evening.     . warfarin (COUMADIN) 1 MG tablet Take 2-3 mg by mouth See admin instructions. Takes 2 tablets on Monday, Tuesday and then take 3  tablets on all other days     No current facility-administered medications for this visit.    Allergies:   Benadryl    Social History:  The patient  reports that he quit smoking about 34 years ago. His smoking use included Cigarettes. He has a 200 pack-year smoking history. He has never used smokeless tobacco. He reports that he does not drink alcohol or use illicit drugs.   Family History:  The patient's *family history includes Cancer in his father; Colon cancer in his mother; Heart disease in his father; Stroke in his paternal uncle.    ROS:  Please see the history of present illness.   Otherwise, review of systems are positive for joint pain.   All other systems are reviewed and negative.    PHYSICAL EXAM: VS:  BP 138/60 mmHg  Pulse 60  Ht 5\' 5"  (1.651 m)  Wt 176 lb 6.4 oz (80.015 kg)  BMI 29.35 kg/m2  SpO2 96% , BMI Body mass index is 29.35 kg/(m^2). GEN: Well nourished, well developed, in no acute distress HEENT: normal Neck: no JVD, carotid bruits, or masses Cardiac: RRR; no murmurs, rubs, or gallops,bilateral mild edema  Respiratory:  clear to auscultation bilaterally, normal work of breathing GI: soft, nontender, nondistended, + BS MS: no deformity or atrophy Skin: warm and dry, no rash Neuro:  Strength and sensation are intact Psych: euthymic mood, full affect    Recent Labs: 11/25/2013: Magnesium 1.8 04/21/2014: ALT 11; B Natriuretic Peptide 1883.1*; Hemoglobin 12.3*; Platelets 114* 05/02/2014: Pro B Natriuretic peptide (BNP) 1499.0* 05/30/2014: BUN 24*; Creatinine 1.55*; Potassium 3.9; Sodium 136   Lipid Panel No results found for: CHOL, TRIG, HDL, CHOLHDL, VLDL, LDLCALC, LDLDIRECT   Other studies Reviewed: Additional studies/ records that were reviewed today with results demonstrating: prior labs and office note.   ASSESSMENT AND PLAN:  1. Chronic systolic heart failure: Currently euvolemic. Continue current dose of diaphoretic. Will check electrolytes. His  creatinine had been going up based on the previous checks. We'll also check INR.  Encouraged him to minimize salt. I encouraged him to get a scale and weigh himself daily. This will help [place] to see whether he needs additional diarrhetic. 2. Atrial fibrillation: Rate control. Coumadin for stroke prevention. He is not sure that his Coumadin has been checked recently. Therefore, will check INR today. 3. Coronary artery disease: No complaints consistent with ischemia at this time. Continue aggressive secondary prevention. 4. Chronic renal insufficiency: Watch for overdiuresis.   Current medicines are reviewed at length with the patient today.  The patient  concerns regarding his medicines were addressed.  The following changes have been made:  No change  Labs/ tests ordered today include: BMet  Orders Placed This Encounter  Procedures  . Basic Metabolic Panel (BMET)  . INR/PT    Recommend 150 minutes/week of aerobic exercise Low fat, low carb, high fiber diet recommended  Disposition:   FU in 6 months   Teresita Madura., MD  06/16/2014 10:52 AM    [facility] [address], [place], [place]  [address] Phone: [phone]; Fax: [phone]

## 2014-06-16 NOTE — Progress Notes (Signed)
CRT-D device check in office. Thresholds and sensing consistent with previous device measurements. Lead impedance trends stable over time. 546 mode switch episodes recorded (1.2% +warfarin)- longest 4:28:30, pk A 640, pk V 104. No ventricular arrhythmia episodes recorded. Patient bi-ventricularly pacing 85% of the time. Device programmed with appropriate safety margins. Heart failure diagnostics reviewed and trends are stable for patient aside from an elevation in March. No changes made this session. Estimated longevity 4.1 years.  Patient enrolled in remote follow up. ROV with [facility] in August.

## 2014-06-21 ENCOUNTER — Ambulatory Visit: Payer: Medicare HMO | Admitting: Emergency Medicine

## 2014-06-27 ENCOUNTER — Telehealth: Payer: Self-pay | Admitting: *Deleted

## 2014-06-27 NOTE — Telephone Encounter (Signed)
Have attempted to call pt on 4 separate occassions to see where he plans to get coumadin checked but there is no answer.

## 2014-06-28 ENCOUNTER — Other Ambulatory Visit: Payer: Self-pay | Admitting: Interventional Cardiology

## 2014-06-29 NOTE — Telephone Encounter (Signed)
Per note 5.26.16

## 2014-08-03 ENCOUNTER — Other Ambulatory Visit: Payer: Self-pay | Admitting: *Deleted

## 2014-08-03 ENCOUNTER — Telehealth: Payer: Self-pay | Admitting: Interventional Cardiology

## 2014-08-03 DIAGNOSIS — I2510 Atherosclerotic heart disease of native coronary artery without angina pectoris: Secondary | ICD-10-CM

## 2014-08-03 DIAGNOSIS — Z7689 Persons encountering health services in other specified circumstances: Secondary | ICD-10-CM

## 2014-08-03 MED ORDER — ATORVASTATIN CALCIUM 10 MG PO TABS: 10.0000 mg | ORAL_TABLET | Freq: Every day | ORAL | Status: DC

## 2014-08-03 NOTE — Telephone Encounter (Signed)
OK to fill statin.  Can check labs in a few months from now.

## 2014-08-03 NOTE — Telephone Encounter (Signed)
Does Dr Irish Lack normally refill this? I do not see a recent lipid/liver. Please advise. Thanks, MI

## 2014-08-03 NOTE — Telephone Encounter (Signed)
OK to refer to the closest [facility] internal medicine site to his house.

## 2014-08-03 NOTE — Telephone Encounter (Signed)
Ok to fill.  I will call pt and schedule labs.

## 2014-08-03 NOTE — Telephone Encounter (Signed)
Attempted to call pt to schedule for fasting lipid and liver panel. Phone rang several times with no answer and no voicemail. Will try again later.

## 2014-08-03 NOTE — Telephone Encounter (Signed)
Spoke with pt and informed him that we needed to check some fasting labs. Pt verbalized understanding and scheduled labs for 7/15.  Pt states that he has been "having a time" with his PCP and getting his coumadin checked. Spoke with pt about our coumadin clinic here and he was willing to switch. Spoke with Gay Filler and scheduled pt for CVRR appt 08/05/14 at 9:30AM. Pt would like for [name] to refer him to a good PCP. He said it did not need to be one in [place], that he willing to come to [place] because he doesn't go that often. Will forward to [name] on suggestions for PCP.

## 2014-08-04 ENCOUNTER — Encounter: Payer: Self-pay | Admitting: *Deleted

## 2014-08-04 NOTE — Telephone Encounter (Signed)
Spoke with pt and gave him the name of a provider in [place]. Pt states that he prefers to see a provider in [place]. Asked pt if he had any preferences and he said no to just pick him one. Informed pt that I would place a referral and he would be contacted by that office for an appt.

## 2014-08-05 ENCOUNTER — Ambulatory Visit (INDEPENDENT_AMBULATORY_CARE_PROVIDER_SITE_OTHER): Payer: Medicare HMO

## 2014-08-05 ENCOUNTER — Other Ambulatory Visit (INDEPENDENT_AMBULATORY_CARE_PROVIDER_SITE_OTHER): Payer: Medicare HMO | Admitting: *Deleted

## 2014-08-05 DIAGNOSIS — I2510 Atherosclerotic heart disease of native coronary artery without angina pectoris: Secondary | ICD-10-CM | POA: Diagnosis not present

## 2014-08-05 DIAGNOSIS — I482 Chronic atrial fibrillation, unspecified: Secondary | ICD-10-CM

## 2014-08-05 DIAGNOSIS — Z5181 Encounter for therapeutic drug level monitoring: Secondary | ICD-10-CM | POA: Diagnosis not present

## 2014-08-05 DIAGNOSIS — I82409 Acute embolism and thrombosis of unspecified deep veins of unspecified lower extremity: Secondary | ICD-10-CM | POA: Diagnosis not present

## 2014-08-05 DIAGNOSIS — I480 Paroxysmal atrial fibrillation: Secondary | ICD-10-CM

## 2014-08-05 LAB — POCT INR: INR: 2.1

## 2014-08-05 LAB — LIPID PANEL
CHOL/HDL RATIO: 3
Cholesterol: 102 mg/dL (ref 0–200)
HDL: 32.8 mg/dL — ABNORMAL LOW (ref >39.00)
LDL Cholesterol: 52 mg/dL (ref 0–99)
NonHDL: 69.2
Triglycerides: 84 mg/dL (ref 0.0–149.0)
VLDL: 16.8 mg/dL (ref 0.0–40.0)

## 2014-08-05 LAB — HEPATIC FUNCTION PANEL
ALT: 8 U/L (ref 0–53)
AST: 11 U/L (ref 0–37)
Albumin: 3.7 g/dL (ref 3.5–5.2)
Alkaline Phosphatase: 75 U/L (ref 39–117)
BILIRUBIN TOTAL: 0.7 mg/dL (ref 0.2–1.2)
Bilirubin, Direct: 0.2 mg/dL (ref 0.0–0.3)
Total Protein: 6.5 g/dL (ref 6.0–8.3)

## 2014-08-05 NOTE — Patient Instructions (Signed)

## 2014-08-05 NOTE — Addendum Note (Signed)
Addended by: Eulis Foster on: 08/05/2014 08:47 AM   Modules accepted: Orders

## 2014-08-16 ENCOUNTER — Ambulatory Visit: Payer: Medicare HMO | Admitting: Internal Medicine

## 2014-08-18 ENCOUNTER — Other Ambulatory Visit: Payer: Self-pay | Admitting: Emergency Medicine

## 2014-08-19 ENCOUNTER — Ambulatory Visit (INDEPENDENT_AMBULATORY_CARE_PROVIDER_SITE_OTHER): Payer: Medicare HMO | Admitting: *Deleted

## 2014-08-19 DIAGNOSIS — I82409 Acute embolism and thrombosis of unspecified deep veins of unspecified lower extremity: Secondary | ICD-10-CM

## 2014-08-19 DIAGNOSIS — I482 Chronic atrial fibrillation, unspecified: Secondary | ICD-10-CM

## 2014-08-19 DIAGNOSIS — I480 Paroxysmal atrial fibrillation: Secondary | ICD-10-CM

## 2014-08-19 DIAGNOSIS — Z5181 Encounter for therapeutic drug level monitoring: Secondary | ICD-10-CM

## 2014-08-19 LAB — POCT INR: INR: 2

## 2014-08-25 ENCOUNTER — Encounter: Payer: Self-pay | Admitting: Internal Medicine

## 2014-08-25 ENCOUNTER — Ambulatory Visit: Payer: Medicare HMO | Admitting: Internal Medicine

## 2014-09-04 ENCOUNTER — Other Ambulatory Visit: Payer: Self-pay | Admitting: Emergency Medicine

## 2014-09-09 ENCOUNTER — Ambulatory Visit (INDEPENDENT_AMBULATORY_CARE_PROVIDER_SITE_OTHER): Payer: Medicare HMO | Admitting: *Deleted

## 2014-09-09 DIAGNOSIS — I82409 Acute embolism and thrombosis of unspecified deep veins of unspecified lower extremity: Secondary | ICD-10-CM | POA: Diagnosis not present

## 2014-09-09 DIAGNOSIS — Z5181 Encounter for therapeutic drug level monitoring: Secondary | ICD-10-CM | POA: Diagnosis not present

## 2014-09-09 DIAGNOSIS — I480 Paroxysmal atrial fibrillation: Secondary | ICD-10-CM | POA: Diagnosis not present

## 2014-09-09 DIAGNOSIS — I482 Chronic atrial fibrillation, unspecified: Secondary | ICD-10-CM

## 2014-09-09 LAB — POCT INR: INR: 2.2

## 2014-09-23 ENCOUNTER — Telehealth: Payer: Self-pay | Admitting: Interventional Cardiology

## 2014-09-23 NOTE — Telephone Encounter (Signed)
Follow up      Calling to see if Dr Irish Lack responded to the previous message regarding feet/ankles swelling

## 2014-09-23 NOTE — Telephone Encounter (Signed)
Spoke with pt and provided him with recommendations from [name]. Advised pt to let us know if there is no improvement. Pt verbalized understanding and was in agreement with this plan.

## 2014-09-23 NOTE — Telephone Encounter (Signed)
New message      Pt c/o swelling: STAT is pt has developed SOB within 24 hours  1. How long have you been experiencing swelling?  Short period of time  2. Where is the swelling located? Feet/ankles 3.  Are you currently taking a "fluid pill"? Lasix      80mg  bid  4.  Are you currently SOB? no 5.  Have you traveled recently?no

## 2014-09-23 NOTE — Telephone Encounter (Signed)
Pt states that he has been having some swelling over the last several days in bilateral ankle and feet. Pt states that he is currently taking Lasix 80mg  BID. Denies SOB, CP, lightheadedness or dizziness. Denies excessive salt intake or change in diet. Pt does not check vitals at home but states that BP was fine recently at PCP. Will forward to [name] for review and advisement.

## 2014-09-23 NOTE — Telephone Encounter (Signed)
Already taking high dose Lasix.  Try to elevate legs.  Try to reduce fluid intake for the next 2 days.

## 2014-09-24 ENCOUNTER — Emergency Department (HOSPITAL_COMMUNITY): Payer: Medicare HMO

## 2014-09-24 ENCOUNTER — Encounter (HOSPITAL_COMMUNITY): Payer: Self-pay | Admitting: Emergency Medicine

## 2014-09-24 ENCOUNTER — Emergency Department (HOSPITAL_COMMUNITY)
Admission: EM | Admit: 2014-09-24 | Discharge: 2014-09-24 | Disposition: A | Discharge status: Home / Self Care | Payer: Medicare HMO | Attending: Emergency Medicine | Admitting: Emergency Medicine

## 2014-09-24 DIAGNOSIS — Z86718 Personal history of other venous thrombosis and embolism: Secondary | ICD-10-CM | POA: Insufficient documentation

## 2014-09-24 DIAGNOSIS — Z87448 Personal history of other diseases of urinary system: Secondary | ICD-10-CM | POA: Diagnosis not present

## 2014-09-24 DIAGNOSIS — I10 Essential (primary) hypertension: Secondary | ICD-10-CM | POA: Diagnosis not present

## 2014-09-24 DIAGNOSIS — Z87891 Personal history of nicotine dependence: Secondary | ICD-10-CM | POA: Insufficient documentation

## 2014-09-24 DIAGNOSIS — R0602 Shortness of breath: Secondary | ICD-10-CM | POA: Insufficient documentation

## 2014-09-24 DIAGNOSIS — I2510 Atherosclerotic heart disease of native coronary artery without angina pectoris: Secondary | ICD-10-CM | POA: Diagnosis not present

## 2014-09-24 DIAGNOSIS — Z79899 Other long term (current) drug therapy: Secondary | ICD-10-CM | POA: Diagnosis not present

## 2014-09-24 DIAGNOSIS — Z7951 Long term (current) use of inhaled steroids: Secondary | ICD-10-CM | POA: Diagnosis not present

## 2014-09-24 DIAGNOSIS — Z7901 Long term (current) use of anticoagulants: Secondary | ICD-10-CM | POA: Insufficient documentation

## 2014-09-24 DIAGNOSIS — M109 Gout, unspecified: Secondary | ICD-10-CM | POA: Insufficient documentation

## 2014-09-24 DIAGNOSIS — K219 Gastro-esophageal reflux disease without esophagitis: Secondary | ICD-10-CM | POA: Diagnosis not present

## 2014-09-24 DIAGNOSIS — I509 Heart failure, unspecified: Secondary | ICD-10-CM | POA: Diagnosis not present

## 2014-09-24 DIAGNOSIS — J449 Chronic obstructive pulmonary disease, unspecified: Secondary | ICD-10-CM | POA: Diagnosis not present

## 2014-09-24 DIAGNOSIS — R2243 Localized swelling, mass and lump, lower limb, bilateral: Secondary | ICD-10-CM | POA: Diagnosis not present

## 2014-09-24 DIAGNOSIS — R609 Edema, unspecified: Secondary | ICD-10-CM

## 2014-09-24 LAB — CBC
HEMATOCRIT: 39.8 % (ref 39.0–52.0)
Hemoglobin: 13 g/dL (ref 13.0–17.0)
MCH: 32.9 pg (ref 26.0–34.0)
MCHC: 32.7 g/dL (ref 30.0–36.0)
MCV: 100.8 fL — AB (ref 78.0–100.0)
PLATELETS: 121 10*3/uL — AB (ref 150–400)
RBC: 3.95 MIL/uL — AB (ref 4.22–5.81)
RDW: 14.9 % (ref 11.5–15.5)
WBC: 8.7 10*3/uL (ref 4.0–10.5)

## 2014-09-24 LAB — BASIC METABOLIC PANEL
Anion gap: 7 (ref 5–15)
BUN: 15 mg/dL (ref 6–20)
CHLORIDE: 97 mmol/L — AB (ref 101–111)
CO2: 34 mmol/L — ABNORMAL HIGH (ref 22–32)
CREATININE: 1.37 mg/dL — AB (ref 0.61–1.24)
Calcium: 8.7 mg/dL — ABNORMAL LOW (ref 8.9–10.3)
GFR calc non Af Amer: 49 mL/min — ABNORMAL LOW (ref >60)
GFR, EST AFRICAN AMERICAN: 57 mL/min — AB (ref >60)
Glucose, Bld: 111 mg/dL — ABNORMAL HIGH (ref 65–99)
POTASSIUM: 3.2 mmol/L — AB (ref 3.5–5.1)
SODIUM: 138 mmol/L (ref 135–145)

## 2014-09-24 LAB — I-STAT TROPONIN, ED: Troponin i, poc: 0.04 ng/mL (ref 0.00–0.08)

## 2014-09-24 LAB — BRAIN NATRIURETIC PEPTIDE: B NATRIURETIC PEPTIDE 5: 1158.8 pg/mL — AB (ref 0.0–100.0)

## 2014-09-24 MED ORDER — POTASSIUM CHLORIDE CRYS ER 20 MEQ PO TBCR: 20.0000 meq | EXTENDED_RELEASE_TABLET | Freq: Every day | ORAL | Status: DC

## 2014-09-24 MED ORDER — POTASSIUM CHLORIDE CRYS ER 20 MEQ PO TBCR
40.0000 meq | EXTENDED_RELEASE_TABLET | Freq: Once | ORAL | Status: DC
  Filled 2014-09-24: qty 2

## 2014-09-24 MED ORDER — FUROSEMIDE 10 MG/ML IJ SOLN
80.0000 mg | Freq: Once | INTRAMUSCULAR | Status: AC
  Administered 2014-09-24: 80 mg via INTRAVENOUS
  Filled 2014-09-24: qty 8

## 2014-09-24 NOTE — Discharge Instructions (Signed)
Double up on your lasix.   Take 2,  80 mg lasix twice a day.  Follow up with your md next week.

## 2014-09-24 NOTE — ED Notes (Addendum)
MD went in the room to update patient.   After he left pt's friend came out to desk and states pt "will be going to [id number] tomorrow to find out what is REALLY going on".  This RN collected the potassium to give to the pt and when I arrived in to the room the patient was agitated.  This RN attempted to tell patient I was going to give him potassium when the pt interrupted and states "Get all of this sh*t off of me and get me the h*ll out of here".  This RN sat down in chair in front of patient to begin to explain the reasoning behind his care today, the ways in which he had helped him, and how CHF affects swelling (already previously done during the visit as well).  The pt interrupted the RN again and states "I don't want your medicines, I don't want you to explain nothing, and I want you to unhook me so I can get the h*ll out of here".  This RN explained to pt that he had paperwork to go home with.  Pt refused all paperwork.  This RN removed pt's IV and unhooked him from the monitor.  This RN offered a wheelchair to patient and he accepted.  This RN again attempted to tell pt before he left that she would like to try to explain his care, and pt refused any more conversation.

## 2014-09-24 NOTE — ED Notes (Addendum)
Pt c/o shortness of breath and swelling to B/L feet for past couple days. Pt has taken 80mg  of lasix today and having decrease urine output

## 2014-09-24 NOTE — ED Provider Notes (Signed)
CSN: [id number]     Arrival date & time 09/24/14  1640 History   First MD Initiated Contact with Patient 09/24/14 1716     Chief Complaint  Patient presents with  . Leg Swelling  . Shortness of Breath     (Consider location/radiation/quality/duration/timing/severity/associated sxs/prior Treatment) Patient is a 74 y.o. male presenting with weakness. The history is provided by the patient (pt complains of worse swelling in legs).  Weakness This is a recurrent problem. The current episode started more than 2 days ago. The problem occurs constantly. The problem has not changed since onset.Pertinent negatives include no chest pain, no abdominal pain and no headaches. Nothing aggravates the symptoms. Nothing relieves the symptoms.    Past Medical History  Diagnosis Date  . CAD (coronary artery disease) 1996    status post PCI of the RCA   . Ischemic dilated cardiomyopathy   . HTN (hypertension)   . GERD (gastroesophageal reflux disease)   . Personal history of DVT (deep vein thrombosis)   . Diabetes mellitus, type 2   . DJD (degenerative joint disease)   . Gout   . Prostatitis   . Nephrolithiasis   . Atrial fibrillation   . Congestive heart failure, unspecified   . Other primary cardiomyopathies   . COPD (chronic obstructive pulmonary disease)     Pt on home O2 at night and PRN, unsure of date of diagnosis.   Past Surgical History  Procedure Laterality Date  . Back surgery    . Coronary stent placement    . Cardiac defibrillator placement    . Pacemaker insertion     Family History  Problem Relation Age of Onset  . Heart disease Father   . Cancer Father     LUNG  . Stroke Paternal Uncle   . Colon cancer Mother    Social History  Substance Use Topics  . Smoking status: Former Smoker -- 4.00 packs/day for 50 years    Types: Cigarettes    Quit date: 01/22/1980  . Smokeless tobacco: Never Used  . Alcohol Use: No     Comment: denies    Review of Systems   Constitutional: Negative for appetite change and fatigue.  HENT: Negative for congestion, ear discharge and sinus pressure.   Eyes: Negative for discharge.  Respiratory: Negative for cough.   Cardiovascular: Negative for chest pain.  Gastrointestinal: Negative for abdominal pain and diarrhea.  Genitourinary: Negative for frequency and hematuria.  Musculoskeletal: Negative for back pain.       Swelling to both legs  Skin: Negative for rash.  Neurological: Positive for weakness. Negative for seizures and headaches.  Psychiatric/Behavioral: Negative for hallucinations.      Allergies  Benadryl  Home Medications   Prior to Admission medications   Medication Sig Start Date End Date Taking? Authorizing Provider  acetaminophen (TYLENOL) 500 MG tablet Take 500 mg by mouth every 6 (six) hours as needed for mild pain or moderate pain.    Yes Historical Provider, MD  albuterol (ACCUNEB) 1.25 MG/3ML nebulizer solution INHALE ONE VIAL VIA NEBULIZER EVERY 6 HOURS AS NEEDED FOR WHEEZING. 02/09/14  Yes Collene Gobble, MD  alfuzosin (UROXATRAL) 10 MG 24 hr tablet Take 10 mg by mouth daily with breakfast.    Yes Historical Provider, MD  allopurinol (ZYLOPRIM) 100 MG tablet Take 100 mg by mouth daily.   Yes Historical Provider, MD  atorvastatin (LIPITOR) 10 MG tablet Take 1 tablet (10 mg total) by mouth at bedtime. 08/03/14  Yes  Jettie Booze, MD  carvedilol (COREG) 12.5 MG tablet Take 1 tablet (12.5 mg total) by mouth 2 (two) times daily with a meal. 11/26/13  Yes Kinnie Feil, MD  digoxin (LANOXIN) 0.125 MG tablet Take 1 tablet (125 mcg total) by mouth daily. 11/26/13  Yes Kinnie Feil, MD  fluticasone (FLONASE) 50 MCG/ACT nasal spray Place 2 sprays into both nostrils at bedtime.  04/07/13  Yes Historical Provider, MD  furosemide (LASIX) 80 MG tablet Take 1 tablet (80 mg total) by mouth daily. Patient taking differently: Take 80 mg by mouth 2 (two) times daily.  06/01/14  Yes Imogene Burn, PA-C  guaiFENesin-dextromethorphan (ROBITUSSIN DM) 100-10 MG/5ML syrup Take 5 mLs by mouth every 4 (four) hours as needed for cough. 11/26/13  Yes Kinnie Feil, MD  hydrALAZINE (APRESOLINE) 25 MG tablet Take 1 tablet (25 mg total) by mouth 3 (three) times daily. 04/25/14  Yes Imogene Burn, PA-C  loratadine (CLARITIN) 10 MG tablet Take 10 mg by mouth daily. For allergies   Yes Historical Provider, MD  losartan (COZAAR) 100 MG tablet Take 100 mg by mouth daily.  03/30/14  Yes Historical Provider, MD  omeprazole (PRILOSEC) 40 MG capsule Take 40 mg by mouth at bedtime.    Yes Historical Provider, MD  PROAIR HFA 108 (90 BASE) MCG/ACT inhaler INHALE 2 PUFFS EVERY 4 HOURS AS NEEDED FOR WHEEZING OR SHORTNESS OF BREATH. 09/05/14  Yes Collene Gobble, MD  warfarin (COUMADIN) 1 MG tablet Take 2-3 mg by mouth daily with supper. Take 3 tablets (3 mg) on Wednesday and Saturday, take 2 tablets (2 mg) on Sunday, Monday, Tuesday, Thursday, Friday   Yes Historical Provider, MD  mometasone-formoterol (DULERA) 100-5 MCG/ACT AERO Inhale 2 puffs into the lungs 2 (two) times daily. Patient not taking: Reported on 09/24/2014 11/26/13   Kinnie Feil, MD  potassium chloride (K-DUR) 10 MEQ tablet Take 2 tablets (20 meq) twice a day x 1 week until labs done 4/11. Patient not taking: Reported on 09/24/2014 04/27/14   Imogene Burn, PA-C  potassium chloride SA (K-DUR,KLOR-CON) 20 MEQ tablet Take 1 tablet (20 mEq total) by mouth daily. 09/24/14   Milton Ferguson, MD   BP 154/60 mmHg  Pulse 78  Temp(Src) 98 F (36.7 C) (Oral)  Resp 15  Ht 5\' 5"  (1.651 m)  Wt 178 lb 8 oz (80.967 kg)  BMI 29.70 kg/m2  SpO2 95% Physical Exam  Constitutional: He is oriented to person, place, and time. He appears well-developed.  HENT:  Head: Normocephalic.  Eyes: Conjunctivae and EOM are normal. No scleral icterus.  Neck: Neck supple. No thyromegaly present.  Cardiovascular: Normal rate and regular rhythm.  Exam reveals no gallop and no  friction rub.   No murmur heard. Pulmonary/Chest: No stridor. He has no wheezes. He has no rales. He exhibits no tenderness.  Abdominal: He exhibits no distension. There is no tenderness. There is no rebound.  Musculoskeletal: Normal range of motion. He exhibits no edema.  2 plus edema both legs  Lymphadenopathy:    He has no cervical adenopathy.  Neurological: He is oriented to person, place, and time. He exhibits normal muscle tone. Coordination normal.  Skin: No rash noted. No erythema.  Psychiatric: He has a normal mood and affect. His behavior is normal.    ED Course  Procedures (including critical care time) Labs Review Labs Reviewed  BASIC METABOLIC PANEL - Abnormal; Notable for the following:    Potassium 3.2 (*)  Chloride 97 (*)    CO2 34 (*)    Glucose, Bld 111 (*)    Creatinine, Ser 1.37 (*)    Calcium 8.7 (*)    GFR calc non Af Amer 49 (*)    GFR calc Af Amer 57 (*)    All other components within normal limits  CBC - Abnormal; Notable for the following:    RBC 3.95 (*)    MCV 100.8 (*)    Platelets 121 (*)    All other components within normal limits  BRAIN NATRIURETIC PEPTIDE - Abnormal; Notable for the following:    B Natriuretic Peptide 1158.8 (*)    All other components within normal limits  I-STAT TROPOININ, ED    Imaging Review Dg Chest 2 View  09/24/2014   CLINICAL DATA:  Shortness of breath.  EXAM: CHEST  2 VIEW  COMPARISON:  04/21/2014 and prior radiographs  FINDINGS: Cardiomegaly and AICD/ pacemaker again noted.  COPD/ emphysema again identified.  There is no evidence of focal airspace disease, pulmonary edema, suspicious pulmonary nodule/mass, pleural effusion, or pneumothorax. No acute bony abnormalities are identified.  IMPRESSION: No evidence of acute cardiopulmonary disease.  Cardiomegaly and COPD/ emphysema.   Electronically Signed   By: Margarette Canada M.D.   On: 09/24/2014 19:05   I have personally reviewed and evaluated these images and lab  results as part of my medical decision-making.   EKG Interpretation None      MDM   Final diagnoses:  Edema    Pt with chf and increased edema to legs.  Iv lasix given with some results.  Will increase lasix to 160mg  bid,  And start potassium.  Pt to follow up with md next week.    Milton Ferguson, MD 09/24/14 2129

## 2014-09-30 ENCOUNTER — Encounter: Payer: Medicare HMO | Admitting: Internal Medicine

## 2014-10-19 ENCOUNTER — Ambulatory Visit (INDEPENDENT_AMBULATORY_CARE_PROVIDER_SITE_OTHER): Payer: Medicare HMO | Admitting: *Deleted

## 2014-10-19 DIAGNOSIS — I82409 Acute embolism and thrombosis of unspecified deep veins of unspecified lower extremity: Secondary | ICD-10-CM | POA: Diagnosis not present

## 2014-10-19 DIAGNOSIS — Z5181 Encounter for therapeutic drug level monitoring: Secondary | ICD-10-CM

## 2014-10-19 DIAGNOSIS — I482 Chronic atrial fibrillation, unspecified: Secondary | ICD-10-CM

## 2014-10-19 DIAGNOSIS — I480 Paroxysmal atrial fibrillation: Secondary | ICD-10-CM

## 2014-10-19 LAB — POCT INR: INR: 1.5

## 2014-10-24 ENCOUNTER — Ambulatory Visit (INDEPENDENT_AMBULATORY_CARE_PROVIDER_SITE_OTHER): Payer: Medicare HMO | Admitting: *Deleted

## 2014-10-24 ENCOUNTER — Encounter: Payer: Self-pay | Admitting: Internal Medicine

## 2014-10-24 DIAGNOSIS — Z95810 Presence of automatic (implantable) cardiac defibrillator: Secondary | ICD-10-CM | POA: Diagnosis not present

## 2014-10-24 DIAGNOSIS — I480 Paroxysmal atrial fibrillation: Secondary | ICD-10-CM

## 2014-10-24 DIAGNOSIS — I493 Ventricular premature depolarization: Secondary | ICD-10-CM

## 2014-10-24 DIAGNOSIS — I5022 Chronic systolic (congestive) heart failure: Secondary | ICD-10-CM | POA: Diagnosis not present

## 2014-10-24 LAB — CUP PACEART INCLINIC DEVICE CHECK
Battery Remaining Longevity: 44.4 mo
Brady Statistic RV Percent Paced: 80 %
Date Time Interrogation Session: 20161003165422
HighPow Impedance: 45 Ohm
Lead Channel Impedance Value: 450 Ohm
Lead Channel Impedance Value: 887.5 Ohm
Lead Channel Pacing Threshold Amplitude: 0.625 V
Lead Channel Pacing Threshold Amplitude: 0.75 V
Lead Channel Sensing Intrinsic Amplitude: 12 mV
Lead Channel Setting Pacing Amplitude: 2 V
Lead Channel Setting Pacing Amplitude: 2 V
Lead Channel Setting Pacing Pulse Width: 0.5 ms
Lead Channel Setting Sensing Sensitivity: 0.5 mV
MDC IDC MSMT LEADCHNL LV PACING THRESHOLD AMPLITUDE: 0.875 V
MDC IDC MSMT LEADCHNL LV PACING THRESHOLD PULSEWIDTH: 0.5 ms
MDC IDC MSMT LEADCHNL RA IMPEDANCE VALUE: 412.5 Ohm
MDC IDC MSMT LEADCHNL RA PACING THRESHOLD PULSEWIDTH: 0.5 ms
MDC IDC MSMT LEADCHNL RA SENSING INTR AMPL: 5 mV
MDC IDC MSMT LEADCHNL RV PACING THRESHOLD PULSEWIDTH: 0.5 ms
MDC IDC PG MODEL: 3265
MDC IDC SET LEADCHNL RA PACING AMPLITUDE: 1.625
MDC IDC SET LEADCHNL RV PACING PULSEWIDTH: 0.5 ms
MDC IDC STAT BRADY RA PERCENT PACED: 52 %
Pulse Gen Serial Number: 7001284
Zone Setting Detection Interval: 330 ms

## 2014-10-24 NOTE — Progress Notes (Signed)
CRT-D device check in office. Thresholds and sensing consistent with previous device measurements. Lead impedance trends stable over time. 1.5% mode switch + coumadin. No ventricular arrhythmia episodes recorded. Patient bi-ventricularly pacing 80% of the time. Device programmed with appropriate safety margins---changed VF NID from 18 to 24. Heart failure diagnostics reviewed---CorVue abnormal 8/29 x7d, 7/14 x12d. Vibratory alert demonstrated for patient, pt knows to call clinic if felt. No changes made this session. Estimated longevity 3.27yrs. ROV w/ GT/RDS 12/19/14.

## 2014-11-02 ENCOUNTER — Ambulatory Visit (INDEPENDENT_AMBULATORY_CARE_PROVIDER_SITE_OTHER): Payer: Medicare HMO | Admitting: Pharmacist

## 2014-11-02 DIAGNOSIS — I480 Paroxysmal atrial fibrillation: Secondary | ICD-10-CM

## 2014-11-02 DIAGNOSIS — I482 Chronic atrial fibrillation, unspecified: Secondary | ICD-10-CM

## 2014-11-02 DIAGNOSIS — Z5181 Encounter for therapeutic drug level monitoring: Secondary | ICD-10-CM | POA: Diagnosis not present

## 2014-11-02 DIAGNOSIS — I82409 Acute embolism and thrombosis of unspecified deep veins of unspecified lower extremity: Secondary | ICD-10-CM | POA: Diagnosis not present

## 2014-11-02 LAB — POCT INR: INR: 2.2

## 2014-11-03 ENCOUNTER — Telehealth: Payer: Self-pay | Admitting: Interventional Cardiology

## 2014-11-03 ENCOUNTER — Telehealth: Payer: Self-pay

## 2014-11-03 NOTE — Telephone Encounter (Signed)
Patient called in about lasix refill he picked up from pharmacy. He stated that it was lasix 40 mg he picked up and should have been lasix 80 mg. I asked what doctor refilled it and he stated Estella Husk. I didn't see a recent refill in EPIC so I told the patient to call his pharmacy and make sure they gave him correct medication and I would send message to his doctors nurse to give him a call back.

## 2014-11-03 NOTE — Telephone Encounter (Signed)
See other phone note from 10/13

## 2014-11-03 NOTE — Telephone Encounter (Signed)
Pt states that pharmacy filled Lasix 40mg  BID per Dr. Simone Curia? (Pt unsure of name) but put it under Estella Husk. Pt seen in [facility] ED 9/3 for edema and he went to [facility] the next day. Pt states that he is suppose to be on Lasix 80mg  QD but he has been taking them BID. Pt states taking 80mg  QD "isn't enough", he doesn't feel better until he takes the second tablet. Advised pt that I would route this information to [name] for review and advisement.

## 2014-11-03 NOTE — Telephone Encounter (Signed)
New Message      Pt calling stating that he has questions about his medications. Please call back and advise.

## 2014-11-04 MED ORDER — FUROSEMIDE 40 MG PO TABS: ORAL_TABLET | ORAL | Status: DC

## 2014-11-04 NOTE — Telephone Encounter (Signed)
**Note De-identified Ronnisha Felber Obfuscation** No answer and no way to leave a message. Will continue to call. 

## 2014-11-04 NOTE — Telephone Encounter (Signed)
**Note De-Identified Troy [name] Obfuscation** The pt is advised and he verbalized understanding. Per his request I sent new Lasix RX to [place]. To fill.

## 2014-11-04 NOTE — Telephone Encounter (Signed)
Can do Lasix 80 in AM and 40 mg in afternoon.  Check BMet in a week

## 2014-11-04 NOTE — Addendum Note (Signed)
Addended by: Dennie Fetters on: 11/04/2014 02:55 PM   Modules accepted: Orders, Medications

## 2014-11-30 ENCOUNTER — Encounter: Payer: Self-pay | Admitting: Interventional Cardiology

## 2014-11-30 ENCOUNTER — Ambulatory Visit (INDEPENDENT_AMBULATORY_CARE_PROVIDER_SITE_OTHER): Payer: Medicare HMO | Admitting: *Deleted

## 2014-11-30 DIAGNOSIS — Z5181 Encounter for therapeutic drug level monitoring: Secondary | ICD-10-CM

## 2014-11-30 DIAGNOSIS — I480 Paroxysmal atrial fibrillation: Secondary | ICD-10-CM

## 2014-11-30 DIAGNOSIS — I82409 Acute embolism and thrombosis of unspecified deep veins of unspecified lower extremity: Secondary | ICD-10-CM | POA: Diagnosis not present

## 2014-11-30 DIAGNOSIS — I482 Chronic atrial fibrillation, unspecified: Secondary | ICD-10-CM

## 2014-11-30 LAB — POCT INR: INR: 2.5

## 2014-12-05 ENCOUNTER — Telehealth: Payer: Self-pay | Admitting: Cardiology

## 2014-12-05 NOTE — Telephone Encounter (Signed)
Patient called about [facility] Jude recall certified letter. Patient stated that he wanted to know more details about the recall. Patient currently does not use his home monitor and stated that he prefers not to. I stressed the importance of his home monitor to him and let him know that the recall states that the battery life of his device may or may not deplete abruptly. Informed patient that with a home monitor we would be able to monitor his device nightly and that if there is any issues w/ his device we would be able to handle the problem in a timely manner. Instructed patient that I would make MD aware of his call. Patient verbalized understanding.

## 2014-12-18 NOTE — Telephone Encounter (Signed)
Continue remote monitoring. GT

## 2014-12-19 ENCOUNTER — Encounter: Payer: Self-pay | Admitting: Internal Medicine

## 2014-12-19 ENCOUNTER — Ambulatory Visit (INDEPENDENT_AMBULATORY_CARE_PROVIDER_SITE_OTHER): Payer: Medicare HMO | Admitting: Internal Medicine

## 2014-12-19 VITALS — BP 102/58 | HR 84 | Ht 65.0 in | Wt 170.2 lb

## 2014-12-19 DIAGNOSIS — Z95810 Presence of automatic (implantable) cardiac defibrillator: Secondary | ICD-10-CM

## 2014-12-19 DIAGNOSIS — I5022 Chronic systolic (congestive) heart failure: Secondary | ICD-10-CM | POA: Diagnosis not present

## 2014-12-19 DIAGNOSIS — I5023 Acute on chronic systolic (congestive) heart failure: Secondary | ICD-10-CM | POA: Diagnosis not present

## 2014-12-19 DIAGNOSIS — I480 Paroxysmal atrial fibrillation: Secondary | ICD-10-CM | POA: Diagnosis not present

## 2014-12-19 DIAGNOSIS — I2510 Atherosclerotic heart disease of native coronary artery without angina pectoris: Secondary | ICD-10-CM | POA: Diagnosis not present

## 2014-12-19 LAB — CUP PACEART INCLINIC DEVICE CHECK
Date Time Interrogation Session: 20161128091944
HighPow Impedance: 42.4227
Implantable Lead Location: 753858
Lead Channel Impedance Value: 887.5 Ohm
Lead Channel Pacing Threshold Amplitude: 0.75 V
Lead Channel Pacing Threshold Amplitude: 0.75 V
Lead Channel Pacing Threshold Pulse Width: 0.5 ms
Lead Channel Sensing Intrinsic Amplitude: 5 mV
Lead Channel Setting Pacing Amplitude: 2 V
Lead Channel Setting Pacing Pulse Width: 0.5 ms
Lead Channel Setting Sensing Sensitivity: 0.5 mV
MDC IDC LEAD IMPLANT DT: 20120801
MDC IDC LEAD IMPLANT DT: 20120801
MDC IDC LEAD IMPLANT DT: 20120801
MDC IDC LEAD LOCATION: 753859
MDC IDC LEAD LOCATION: 753860
MDC IDC MSMT BATTERY REMAINING LONGEVITY: 43.2
MDC IDC MSMT LEADCHNL RA IMPEDANCE VALUE: 400 Ohm
MDC IDC MSMT LEADCHNL RA PACING THRESHOLD PULSEWIDTH: 0.5 ms
MDC IDC MSMT LEADCHNL RV IMPEDANCE VALUE: 437.5 Ohm
MDC IDC MSMT LEADCHNL RV PACING THRESHOLD AMPLITUDE: 0.875 V
MDC IDC MSMT LEADCHNL RV PACING THRESHOLD PULSEWIDTH: 0.5 ms
MDC IDC MSMT LEADCHNL RV SENSING INTR AMPL: 12 mV
MDC IDC SET LEADCHNL RA PACING AMPLITUDE: 1.75 V
MDC IDC SET LEADCHNL RV PACING AMPLITUDE: 2 V
MDC IDC SET LEADCHNL RV PACING PULSEWIDTH: 0.5 ms
MDC IDC STAT BRADY RA PERCENT PACED: 54 %
MDC IDC STAT BRADY RV PERCENT PACED: 82 %
Pulse Gen Serial Number: 7001284

## 2014-12-19 MED ORDER — SPIRONOLACTONE 25 MG PO TABS: 25.0000 mg | ORAL_TABLET | Freq: Every day | ORAL | Status: DC

## 2014-12-19 NOTE — Assessment & Plan Note (Signed)
His atrial fib has increased but still mostly NSR. He is on coumadin. No indication for AA drugs yet.

## 2014-12-19 NOTE — Progress Notes (Signed)
HPI Troy [name] returns today for followup. He is a pleasant 74 yo man with a mixed CM, chronic class 2 CHF, s/p ICD, and PAF. In the interim he has had more CHF symptoms. He has mild peripheral edema due to venous insufficiency. He denies syncope or ICD shock. He does not feel palpitations. He has a recall device. He refuses home monitoring. Allergies  Allergen Reactions  . Benadryl [Diphenhydramine Hcl] Nausea And Vomiting     Current Outpatient Prescriptions  Medication Sig Dispense Refill  . acetaminophen (TYLENOL) 500 MG tablet Take 500 mg by mouth every 6 (six) hours as needed for mild pain or moderate pain.     Marland Kitchen albuterol (ACCUNEB) 1.25 MG/3ML nebulizer solution INHALE ONE VIAL VIA NEBULIZER EVERY 6 HOURS AS NEEDED FOR WHEEZING. 75 mL 5  . alfuzosin (UROXATRAL) 10 MG 24 hr tablet Take 10 mg by mouth daily with breakfast.     . allopurinol (ZYLOPRIM) 100 MG tablet Take 100 mg by mouth daily.    Marland Kitchen atorvastatin (LIPITOR) 10 MG tablet Take 1 tablet (10 mg total) by mouth at bedtime. 90 tablet 1  . carvedilol (COREG) 12.5 MG tablet Take 1 tablet (12.5 mg total) by mouth 2 (two) times daily with a meal. 60 tablet 1  . digoxin (LANOXIN) 0.125 MG tablet Take 1 tablet (125 mcg total) by mouth daily. 60 tablet 1  . fluticasone (FLONASE) 50 MCG/ACT nasal spray Place 2 sprays into both nostrils at bedtime.     . furosemide (LASIX) 40 MG tablet Take 80 mg (2 tablets) in the AM and 40 mg (1 tablet) in the afternoon 90 tablet 3  . guaiFENesin-dextromethorphan (ROBITUSSIN DM) 100-10 MG/5ML syrup Take 5 mLs by mouth every 4 (four) hours as needed for cough. 118 mL 0  . hydrALAZINE (APRESOLINE) 25 MG tablet Take 1 tablet (25 mg total) by mouth 3 (three) times daily. 90 tablet 6  . loratadine (CLARITIN) 10 MG tablet Take 10 mg by mouth daily. For allergies    . losartan (COZAAR) 100 MG tablet Take 100 mg by mouth daily.     . mometasone-formoterol (DULERA) 100-5 MCG/ACT AERO Inhale 2 puffs into the lungs  2 (two) times daily. 1 Inhaler 1  . omeprazole (PRILOSEC) 40 MG capsule Take 40 mg by mouth at bedtime.     . potassium chloride (K-DUR) 10 MEQ tablet Take 2 tablets (20 meq) twice a day x 1 week until labs done 4/11. 90 tablet 3  . potassium chloride SA (K-DUR,KLOR-CON) 20 MEQ tablet Take 1 tablet (20 mEq total) by mouth daily. 6 tablet 0  . PROAIR HFA 108 (90 BASE) MCG/ACT inhaler INHALE 2 PUFFS EVERY 4 HOURS AS NEEDED FOR WHEEZING OR SHORTNESS OF BREATH. 8.5 g 1  . warfarin (COUMADIN) 1 MG tablet Take 2-3 mg by mouth daily with supper. Take 3 tablets (3 mg) on Wednesday and Saturday, take 2 tablets (2 mg) on Sunday, Monday, Tuesday, Thursday, Friday     No current facility-administered medications for this visit.     Past Medical History  Diagnosis Date  . CAD (coronary artery disease) 1996    status post PCI of the RCA   . Ischemic dilated cardiomyopathy   . HTN (hypertension)   . GERD (gastroesophageal reflux disease)   . Personal history of DVT (deep vein thrombosis)   . Diabetes mellitus, type 2 ([place])   . DJD (degenerative joint disease)   . Gout   . Prostatitis   . Nephrolithiasis   .  Atrial fibrillation ([place])   . Congestive heart failure, unspecified   . Other primary cardiomyopathies   . COPD (chronic obstructive pulmonary disease) (HCC)     Pt on home O2 at night and PRN, unsure of date of diagnosis.    ROS:   All systems reviewed and negative except as noted in the HPI.   Past Surgical History  Procedure Laterality Date  . Back surgery    . Coronary stent placement    . Cardiac defibrillator placement    . Pacemaker insertion       Family History  Problem Relation Age of Onset  . Heart disease Father   . Cancer Father     LUNG  . Stroke Paternal Uncle   . Colon cancer Mother      Social History   Social History  . Marital Status: Single    Spouse Name: N/A  . Number of Children: N/A  . Years of Education: N/A   Occupational History  .  retired    Social History Main Topics  . Smoking status: Former Smoker -- 4.00 packs/day for 50 years    Types: Cigarettes    Quit date: 01/22/1980  . Smokeless tobacco: Never Used  . Alcohol Use: No     Comment: denies  . Drug Use: No  . Sexual Activity: Not on file   Other Topics Concern  . Not on file   Social History Narrative     BP 102/58 mmHg  Pulse 84  Ht 5\' 5"  (1.651 m)  Wt 170 lb 3.2 oz (77.202 kg)  BMI 28.32 kg/m2  SpO2 93%  Physical Exam:  stable appearing 74 yo man, NAD HEENT: Unremarkable Neck:  7 cm JVD, no thyromegally Back:  No CVA tenderness Lungs:  Clear with no wheezes, rales, or rhonchi HEART:  IRegular rate rhythm, no murmurs, no rubs, no clicks Abd:  soft, positive bowel sounds, no organomegally, no rebound, no guarding Ext:  2 plus pulses, no edema, no cyanosis, no clubbing Skin:  No rashes no nodules Neuro:  CN II through XII intact, motor grossly intact   DEVICE  Normal device function.  See PaceArt for details.   Assess/Plan:

## 2014-12-19 NOTE — Assessment & Plan Note (Signed)
He has had some worsening in his CHF symptoms. I have asked him to start taking aldactone. He will come back in a week for a BMP. He is instructed to avoid salty foods.

## 2014-12-19 NOTE — Assessment & Plan Note (Signed)
He is fairly sedentary but has not had angina. Will follow.

## 2014-12-19 NOTE — Patient Instructions (Addendum)
Your physician wants you to follow-up in: 12 Months with Dr. Lovena Le. You will receive a reminder letter in the mail two months in advance. If you don't receive a letter, please call our office to schedule the follow-up appointment.  Your physician recommends that you schedule a follow-up appointment in: 3 Months in the [place] physician has recommended you make the following change in your medication:  Start Aldactone 25 mg Daily  Your physician recommends that you return for lab work in: 1 week ( BMP)    If you need a refill on your cardiac medications before your next appointment, please call your pharmacy.  Thank you for choosing [facility]!

## 2014-12-19 NOTE — Assessment & Plan Note (Signed)
His [employer] Jude BiV ICD is working normally. Will recheck in several months.  

## 2014-12-23 ENCOUNTER — Telehealth: Payer: Self-pay | Admitting: Interventional Cardiology

## 2014-12-23 NOTE — Telephone Encounter (Signed)
Pt returned call. Informed pt of recommendations per [name]. Pt verbalized understanding and was in agreement with this plan. Pt has CVRR appt 12/7 and said that he will update [place] while he is here on how he is feeling.

## 2014-12-23 NOTE — Telephone Encounter (Signed)
Attempted to call pt. Phone rang several times with no answer and no voicemail. Will try again later.

## 2014-12-23 NOTE — Telephone Encounter (Signed)
Spoke with pt and he states that for a few months now he feels like his breathing has changed. Pt states that occasionally "it feels like I want to breathe hard." Pt states that when he lays on his left side he can't breathe as good but it is much better when he's on his right side. Pt states that he seen Dr. Lovena Le on 11/28 and said that he still had some fluid so he started him on Aldactone 25mg  QD. Pt states that some days are worse than others and that he feels fine when sitting still, worsens when he is up moving around a lot. Denies CP, lightheadedness and dizziness. Pt does have COPD. Last echo was 11/22/13, EF at that time 20-25%. Advised pt that I will route this information to [name] for review and advisement.

## 2014-12-23 NOTE — Telephone Encounter (Signed)
Attempted to call pt again to go over orders per [name]. Phone rang several times with no answer and no voicemail. Will try again later.

## 2014-12-23 NOTE — Telephone Encounter (Signed)
Take an extra 40 mg Lasix in the afternoon, total of 80 mg BID for 2 days.  THen go back to the original dose.

## 2014-12-23 NOTE — Telephone Encounter (Signed)
New message      Pt c/o Shortness Of Breath: STAT if SOB developed within the last 24 hours or pt is noticeably SOB on the phone  1. Are you currently SOB (can you hear that pt is SOB on the phone)?  no 2. How long have you been experiencing SOB? "for a little while" 3. Are you SOB when sitting or when up moving around? Moving around  4. Are you currently experiencing any other symptoms? no

## 2014-12-25 ENCOUNTER — Inpatient Hospital Stay (HOSPITAL_COMMUNITY)
Admission: EM | Admit: 2014-12-25 | Discharge: 2014-12-29 | DRG: 291 | Disposition: A | Discharge status: Skilled Nursing Facility | Payer: Medicare HMO | Attending: Internal Medicine | Admitting: Internal Medicine

## 2014-12-25 ENCOUNTER — Encounter (HOSPITAL_COMMUNITY): Payer: Self-pay

## 2014-12-25 ENCOUNTER — Emergency Department (HOSPITAL_COMMUNITY): Payer: Medicare HMO

## 2014-12-25 DIAGNOSIS — I130 Hypertensive heart and chronic kidney disease with heart failure and stage 1 through stage 4 chronic kidney disease, or unspecified chronic kidney disease: Secondary | ICD-10-CM | POA: Diagnosis present

## 2014-12-25 DIAGNOSIS — Z8249 Family history of ischemic heart disease and other diseases of the circulatory system: Secondary | ICD-10-CM

## 2014-12-25 DIAGNOSIS — Z7901 Long term (current) use of anticoagulants: Secondary | ICD-10-CM | POA: Diagnosis not present

## 2014-12-25 DIAGNOSIS — M1990 Unspecified osteoarthritis, unspecified site: Secondary | ICD-10-CM | POA: Diagnosis present

## 2014-12-25 DIAGNOSIS — I420 Dilated cardiomyopathy: Secondary | ICD-10-CM | POA: Diagnosis present

## 2014-12-25 DIAGNOSIS — M109 Gout, unspecified: Secondary | ICD-10-CM | POA: Diagnosis present

## 2014-12-25 DIAGNOSIS — Z801 Family history of malignant neoplasm of trachea, bronchus and lung: Secondary | ICD-10-CM

## 2014-12-25 DIAGNOSIS — Z86718 Personal history of other venous thrombosis and embolism: Secondary | ICD-10-CM | POA: Diagnosis not present

## 2014-12-25 DIAGNOSIS — I480 Paroxysmal atrial fibrillation: Secondary | ICD-10-CM | POA: Diagnosis present

## 2014-12-25 DIAGNOSIS — R627 Adult failure to thrive: Secondary | ICD-10-CM | POA: Diagnosis present

## 2014-12-25 DIAGNOSIS — N179 Acute kidney failure, unspecified: Secondary | ICD-10-CM | POA: Diagnosis present

## 2014-12-25 DIAGNOSIS — I472 Ventricular tachycardia: Secondary | ICD-10-CM | POA: Diagnosis not present

## 2014-12-25 DIAGNOSIS — Z888 Allergy status to other drugs, medicaments and biological substances status: Secondary | ICD-10-CM

## 2014-12-25 DIAGNOSIS — J441 Chronic obstructive pulmonary disease with (acute) exacerbation: Secondary | ICD-10-CM | POA: Diagnosis present

## 2014-12-25 DIAGNOSIS — Z23 Encounter for immunization: Secondary | ICD-10-CM

## 2014-12-25 DIAGNOSIS — N183 Chronic kidney disease, stage 3 unspecified: Secondary | ICD-10-CM | POA: Diagnosis present

## 2014-12-25 DIAGNOSIS — N400 Enlarged prostate without lower urinary tract symptoms: Secondary | ICD-10-CM | POA: Diagnosis present

## 2014-12-25 DIAGNOSIS — I5023 Acute on chronic systolic (congestive) heart failure: Principal | ICD-10-CM | POA: Diagnosis present

## 2014-12-25 DIAGNOSIS — I5043 Acute on chronic combined systolic (congestive) and diastolic (congestive) heart failure: Secondary | ICD-10-CM | POA: Diagnosis not present

## 2014-12-25 DIAGNOSIS — J9621 Acute and chronic respiratory failure with hypoxia: Secondary | ICD-10-CM | POA: Diagnosis present

## 2014-12-25 DIAGNOSIS — Z87442 Personal history of urinary calculi: Secondary | ICD-10-CM | POA: Diagnosis not present

## 2014-12-25 DIAGNOSIS — I509 Heart failure, unspecified: Secondary | ICD-10-CM

## 2014-12-25 DIAGNOSIS — Z95810 Presence of automatic (implantable) cardiac defibrillator: Secondary | ICD-10-CM

## 2014-12-25 DIAGNOSIS — R0602 Shortness of breath: Secondary | ICD-10-CM | POA: Diagnosis not present

## 2014-12-25 DIAGNOSIS — E1122 Type 2 diabetes mellitus with diabetic chronic kidney disease: Secondary | ICD-10-CM | POA: Diagnosis present

## 2014-12-25 DIAGNOSIS — I248 Other forms of acute ischemic heart disease: Secondary | ICD-10-CM | POA: Diagnosis present

## 2014-12-25 DIAGNOSIS — Z638 Other specified problems related to primary support group: Secondary | ICD-10-CM | POA: Diagnosis not present

## 2014-12-25 DIAGNOSIS — Z87891 Personal history of nicotine dependence: Secondary | ICD-10-CM | POA: Diagnosis not present

## 2014-12-25 DIAGNOSIS — I255 Ischemic cardiomyopathy: Secondary | ICD-10-CM | POA: Diagnosis present

## 2014-12-25 DIAGNOSIS — I2510 Atherosclerotic heart disease of native coronary artery without angina pectoris: Secondary | ICD-10-CM | POA: Diagnosis present

## 2014-12-25 DIAGNOSIS — Z823 Family history of stroke: Secondary | ICD-10-CM

## 2014-12-25 DIAGNOSIS — Z9981 Dependence on supplemental oxygen: Secondary | ICD-10-CM | POA: Diagnosis not present

## 2014-12-25 DIAGNOSIS — Z955 Presence of coronary angioplasty implant and graft: Secondary | ICD-10-CM

## 2014-12-25 DIAGNOSIS — K219 Gastro-esophageal reflux disease without esophagitis: Secondary | ICD-10-CM | POA: Diagnosis present

## 2014-12-25 DIAGNOSIS — Z800 Family history of malignant neoplasm of digestive organs: Secondary | ICD-10-CM

## 2014-12-25 DIAGNOSIS — Z79899 Other long term (current) drug therapy: Secondary | ICD-10-CM | POA: Diagnosis not present

## 2014-12-25 DIAGNOSIS — I5020 Unspecified systolic (congestive) heart failure: Secondary | ICD-10-CM

## 2014-12-25 DIAGNOSIS — I482 Chronic atrial fibrillation, unspecified: Secondary | ICD-10-CM | POA: Diagnosis present

## 2014-12-25 DIAGNOSIS — R0600 Dyspnea, unspecified: Secondary | ICD-10-CM | POA: Diagnosis present

## 2014-12-25 LAB — CBC WITH DIFFERENTIAL/PLATELET
Basophils Absolute: 0 10*3/uL (ref 0.0–0.1)
Basophils Relative: 1 %
EOS ABS: 0.1 10*3/uL (ref 0.0–0.7)
EOS PCT: 2 %
HEMATOCRIT: 46.3 % (ref 39.0–52.0)
Hemoglobin: 14.9 g/dL (ref 13.0–17.0)
LYMPHS ABS: 1.6 10*3/uL (ref 0.7–4.0)
LYMPHS PCT: 25 %
MCH: 33.6 pg (ref 26.0–34.0)
MCHC: 32.2 g/dL (ref 30.0–36.0)
MCV: 104.5 fL — AB (ref 78.0–100.0)
MONO ABS: 1.1 10*3/uL — AB (ref 0.1–1.0)
MONOS PCT: 16 %
Neutro Abs: 3.7 10*3/uL (ref 1.7–7.7)
Neutrophils Relative %: 56 %
PLATELETS: 141 10*3/uL — AB (ref 150–400)
RBC: 4.43 MIL/uL (ref 4.22–5.81)
RDW: 16.5 % — ABNORMAL HIGH (ref 11.5–15.5)
WBC: 6.6 10*3/uL (ref 4.0–10.5)

## 2014-12-25 LAB — COMPREHENSIVE METABOLIC PANEL
ALT: 16 U/L — AB (ref 17–63)
AST: 19 U/L (ref 15–41)
Albumin: 3.5 g/dL (ref 3.5–5.0)
Alkaline Phosphatase: 75 U/L (ref 38–126)
Anion gap: 7 (ref 5–15)
BILIRUBIN TOTAL: 1 mg/dL (ref 0.3–1.2)
BUN: 23 mg/dL — AB (ref 6–20)
CHLORIDE: 100 mmol/L — AB (ref 101–111)
CO2: 35 mmol/L — ABNORMAL HIGH (ref 22–32)
CREATININE: 1.76 mg/dL — AB (ref 0.61–1.24)
Calcium: 9.2 mg/dL (ref 8.9–10.3)
GFR calc Af Amer: 42 mL/min — ABNORMAL LOW (ref >60)
GFR, EST NON AFRICAN AMERICAN: 36 mL/min — AB (ref >60)
GLUCOSE: 121 mg/dL — AB (ref 65–99)
Potassium: 3.8 mmol/L (ref 3.5–5.1)
Sodium: 142 mmol/L (ref 135–145)
Total Protein: 6.9 g/dL (ref 6.5–8.1)

## 2014-12-25 LAB — DIGOXIN LEVEL: Digoxin Level: 1.7 ng/mL (ref 0.8–2.0)

## 2014-12-25 LAB — I-STAT TROPONIN, ED: TROPONIN I, POC: 0.05 ng/mL (ref 0.00–0.08)

## 2014-12-25 LAB — TROPONIN I
TROPONIN I: 0.07 ng/mL — AB (ref <0.031)
Troponin I: 0.08 ng/mL — ABNORMAL HIGH (ref <0.031)

## 2014-12-25 LAB — BRAIN NATRIURETIC PEPTIDE: B Natriuretic Peptide: 1078.9 pg/mL — ABNORMAL HIGH (ref 0.0–100.0)

## 2014-12-25 LAB — PROTIME-INR
INR: 2.85 — AB (ref 0.00–1.49)
PROTHROMBIN TIME: 29.5 s — AB (ref 11.6–15.2)

## 2014-12-25 MED ORDER — PANTOPRAZOLE SODIUM 40 MG PO TBEC
40.0000 mg | DELAYED_RELEASE_TABLET | Freq: Every day | ORAL | Status: DC
  Administered 2014-12-25 – 2014-12-29 (×5): 40 mg via ORAL
  Filled 2014-12-25 (×5): qty 1

## 2014-12-25 MED ORDER — IPRATROPIUM-ALBUTEROL 0.5-2.5 (3) MG/3ML IN SOLN: 3.0000 mL | Freq: Four times a day (QID) | RESPIRATORY_TRACT | Status: DC | PRN

## 2014-12-25 MED ORDER — LORATADINE 10 MG PO TABS
10.0000 mg | ORAL_TABLET | Freq: Every day | ORAL | Status: DC
  Administered 2014-12-25 – 2014-12-29 (×5): 10 mg via ORAL
  Filled 2014-12-25 (×5): qty 1

## 2014-12-25 MED ORDER — INFLUENZA VAC SPLIT QUAD 0.5 ML IM SUSY
0.5000 mL | PREFILLED_SYRINGE | INTRAMUSCULAR | Status: AC
  Administered 2014-12-27: 0.5 mL via INTRAMUSCULAR
  Filled 2014-12-25 (×2): qty 0.5

## 2014-12-25 MED ORDER — HYDRALAZINE HCL 25 MG PO TABS
25.0000 mg | ORAL_TABLET | Freq: Three times a day (TID) | ORAL | Status: DC
  Administered 2014-12-25 – 2014-12-29 (×12): 25 mg via ORAL
  Filled 2014-12-25 (×12): qty 1

## 2014-12-25 MED ORDER — POTASSIUM CHLORIDE CRYS ER 20 MEQ PO TBCR
20.0000 meq | EXTENDED_RELEASE_TABLET | Freq: Two times a day (BID) | ORAL | Status: DC
  Administered 2014-12-25 – 2014-12-29 (×9): 20 meq via ORAL
  Filled 2014-12-25 (×9): qty 1

## 2014-12-25 MED ORDER — IPRATROPIUM-ALBUTEROL 0.5-2.5 (3) MG/3ML IN SOLN
3.0000 mL | Freq: Once | RESPIRATORY_TRACT | Status: AC
  Administered 2014-12-25: 3 mL via RESPIRATORY_TRACT
  Filled 2014-12-25: qty 3

## 2014-12-25 MED ORDER — ACETAMINOPHEN 325 MG PO TABS
650.0000 mg | ORAL_TABLET | ORAL | Status: DC | PRN
  Administered 2014-12-27 (×2): 650 mg via ORAL
  Filled 2014-12-25 (×2): qty 2

## 2014-12-25 MED ORDER — ONDANSETRON HCL 4 MG/2ML IJ SOLN: 4.0000 mg | Freq: Four times a day (QID) | INTRAMUSCULAR | Status: DC | PRN

## 2014-12-25 MED ORDER — PREDNISONE 20 MG PO TABS
40.0000 mg | ORAL_TABLET | Freq: Every day | ORAL | Status: AC
  Administered 2014-12-26 – 2014-12-28 (×3): 40 mg via ORAL
  Filled 2014-12-25 (×3): qty 2

## 2014-12-25 MED ORDER — GUAIFENESIN-DM 100-10 MG/5ML PO SYRP
5.0000 mL | ORAL_SOLUTION | ORAL | Status: DC | PRN
  Administered 2014-12-25 – 2014-12-29 (×9): 5 mL via ORAL
  Filled 2014-12-25 (×9): qty 10

## 2014-12-25 MED ORDER — ASPIRIN EC 81 MG PO TBEC
81.0000 mg | DELAYED_RELEASE_TABLET | Freq: Every day | ORAL | Status: DC
  Administered 2014-12-25 – 2014-12-29 (×5): 81 mg via ORAL
  Filled 2014-12-25 (×5): qty 1

## 2014-12-25 MED ORDER — IPRATROPIUM-ALBUTEROL 0.5-2.5 (3) MG/3ML IN SOLN
3.0000 mL | Freq: Three times a day (TID) | RESPIRATORY_TRACT | Status: DC
  Administered 2014-12-26 – 2014-12-29 (×11): 3 mL via RESPIRATORY_TRACT
  Filled 2014-12-25 (×11): qty 3

## 2014-12-25 MED ORDER — WARFARIN - PHARMACIST DOSING INPATIENT: Freq: Every day | Status: DC

## 2014-12-25 MED ORDER — FUROSEMIDE 10 MG/ML IJ SOLN: 80.0000 mg | Freq: Two times a day (BID) | INTRAMUSCULAR | Status: DC

## 2014-12-25 MED ORDER — ALBUTEROL SULFATE (2.5 MG/3ML) 0.083% IN NEBU: 1.2500 mg | INHALATION_SOLUTION | Freq: Four times a day (QID) | RESPIRATORY_TRACT | Status: DC

## 2014-12-25 MED ORDER — SPIRONOLACTONE 25 MG PO TABS
25.0000 mg | ORAL_TABLET | Freq: Every day | ORAL | Status: DC
Start: 2014-12-25 — End: 2014-12-29
  Administered 2014-12-25 – 2014-12-29 (×5): 25 mg via ORAL
  Filled 2014-12-25 (×5): qty 1

## 2014-12-25 MED ORDER — FLUTICASONE PROPIONATE 50 MCG/ACT NA SUSP
2.0000 | Freq: Every day | NASAL | Status: DC
  Administered 2014-12-25 – 2014-12-28 (×4): 2 via NASAL
  Filled 2014-12-25: qty 16

## 2014-12-25 MED ORDER — SODIUM CHLORIDE 0.9 % IJ SOLN: 3.0000 mL | INTRAMUSCULAR | Status: DC | PRN

## 2014-12-25 MED ORDER — CETYLPYRIDINIUM CHLORIDE 0.05 % MT LIQD
7.0000 mL | Freq: Two times a day (BID) | OROMUCOSAL | Status: DC
  Administered 2014-12-25 – 2014-12-29 (×7): 7 mL via OROMUCOSAL

## 2014-12-25 MED ORDER — ATORVASTATIN CALCIUM 10 MG PO TABS
10.0000 mg | ORAL_TABLET | Freq: Every day | ORAL | Status: DC
  Administered 2014-12-25 – 2014-12-28 (×4): 10 mg via ORAL
  Filled 2014-12-25 (×4): qty 1

## 2014-12-25 MED ORDER — SODIUM CHLORIDE 0.9 % IV SOLN: 250.0000 mL | INTRAVENOUS | Status: DC | PRN

## 2014-12-25 MED ORDER — SODIUM CHLORIDE 0.9 % IJ SOLN
3.0000 mL | Freq: Two times a day (BID) | INTRAMUSCULAR | Status: DC
  Administered 2014-12-25 – 2014-12-29 (×7): 3 mL via INTRAVENOUS

## 2014-12-25 MED ORDER — FUROSEMIDE 10 MG/ML IJ SOLN
40.0000 mg | Freq: Once | INTRAMUSCULAR | Status: AC
  Administered 2014-12-25: 40 mg via INTRAVENOUS
  Filled 2014-12-25: qty 4

## 2014-12-25 MED ORDER — DIGOXIN 125 MCG PO TABS
125.0000 ug | ORAL_TABLET | Freq: Every day | ORAL | Status: DC
  Administered 2014-12-25 – 2014-12-29 (×5): 125 ug via ORAL
  Filled 2014-12-25 (×5): qty 1

## 2014-12-25 MED ORDER — ALFUZOSIN HCL ER 10 MG PO TB24
10.0000 mg | ORAL_TABLET | Freq: Every day | ORAL | Status: DC
  Administered 2014-12-25 – 2014-12-29 (×5): 10 mg via ORAL
  Filled 2014-12-25 (×8): qty 1

## 2014-12-25 MED ORDER — METHYLPREDNISOLONE SODIUM SUCC 125 MG IJ SOLR
125.0000 mg | Freq: Once | INTRAMUSCULAR | Status: AC
  Administered 2014-12-25: 125 mg via INTRAVENOUS
  Filled 2014-12-25: qty 2

## 2014-12-25 MED ORDER — FUROSEMIDE 10 MG/ML IJ SOLN
40.0000 mg | Freq: Two times a day (BID) | INTRAMUSCULAR | Status: DC
  Administered 2014-12-25: 40 mg via INTRAVENOUS
  Filled 2014-12-25: qty 4

## 2014-12-25 MED ORDER — PNEUMOCOCCAL VAC POLYVALENT 25 MCG/0.5ML IJ INJ
0.5000 mL | INJECTION | INTRAMUSCULAR | Status: DC
  Filled 2014-12-25 (×2): qty 0.5

## 2014-12-25 MED ORDER — IPRATROPIUM BROMIDE 0.02 % IN SOLN: 0.5000 mg | Freq: Four times a day (QID) | RESPIRATORY_TRACT | Status: DC

## 2014-12-25 MED ORDER — LOSARTAN POTASSIUM 50 MG PO TABS
100.0000 mg | ORAL_TABLET | Freq: Every day | ORAL | Status: DC
  Administered 2014-12-25 – 2014-12-29 (×5): 100 mg via ORAL
  Filled 2014-12-25 (×5): qty 2

## 2014-12-25 MED ORDER — MOMETASONE FURO-FORMOTEROL FUM 100-5 MCG/ACT IN AERO
2.0000 | INHALATION_SPRAY | Freq: Two times a day (BID) | RESPIRATORY_TRACT | Status: DC
  Administered 2014-12-26 – 2014-12-29 (×7): 2 via RESPIRATORY_TRACT
  Filled 2014-12-25: qty 8.8

## 2014-12-25 MED ORDER — IPRATROPIUM-ALBUTEROL 0.5-2.5 (3) MG/3ML IN SOLN
3.0000 mL | Freq: Four times a day (QID) | RESPIRATORY_TRACT | Status: DC
  Administered 2014-12-25: 3 mL via RESPIRATORY_TRACT
  Filled 2014-12-25: qty 3

## 2014-12-25 MED ORDER — ALLOPURINOL 100 MG PO TABS
100.0000 mg | ORAL_TABLET | Freq: Every day | ORAL | Status: DC
  Administered 2014-12-25 – 2014-12-29 (×5): 100 mg via ORAL
  Filled 2014-12-25 (×5): qty 1

## 2014-12-25 MED ORDER — CARVEDILOL 12.5 MG PO TABS
12.5000 mg | ORAL_TABLET | Freq: Two times a day (BID) | ORAL | Status: DC
  Administered 2014-12-25 – 2014-12-29 (×8): 12.5 mg via ORAL
  Filled 2014-12-25 (×9): qty 1

## 2014-12-25 MED ORDER — TIOTROPIUM BROMIDE MONOHYDRATE 18 MCG IN CAPS: 18.0000 ug | ORAL_CAPSULE | Freq: Every day | RESPIRATORY_TRACT | Status: DC

## 2014-12-25 MED ORDER — GUAIFENESIN ER 600 MG PO TB12
600.0000 mg | ORAL_TABLET | Freq: Two times a day (BID) | ORAL | Status: DC
  Administered 2014-12-25 – 2014-12-29 (×9): 600 mg via ORAL
  Filled 2014-12-25 (×9): qty 1

## 2014-12-25 MED ORDER — WARFARIN SODIUM 2 MG PO TABS
2.0000 mg | ORAL_TABLET | Freq: Once | ORAL | Status: AC
  Administered 2014-12-25: 2 mg via ORAL
  Filled 2014-12-25: qty 1

## 2014-12-25 NOTE — Progress Notes (Addendum)
ANTICOAGULATION CONSULT NOTE - Initial Consult  Pharmacy Consult for warfarin Indication: atrial fibrillation  Allergies  Allergen Reactions  . Benadryl [Diphenhydramine Hcl] Nausea And Vomiting    Patient Measurements: Height: 5\' 5"  (165.1 cm) Weight: 162 lb 11.2 oz (73.8 kg) IBW/kg (Calculated) : 61.5 Heparin Dosing Weight:   Vital Signs: Temp: 98 F (36.7 C) (12/04 1044) Temp Source: Oral (12/04 1044) BP: 154/61 mmHg (12/04 1044) Pulse Rate: 67 (12/04 1044)  Labs:  Recent Labs  12/25/14 0807  HGB 14.9  HCT 46.3  PLT 141*  LABPROT 29.5*  INR 2.85*  CREATININE 1.76*    Estimated Creatinine Clearance: 34.6 mL/min (by C-G formula based on Cr of 1.76).   Medical History: Past Medical History  Diagnosis Date  . CAD (coronary artery disease) 1996    status post PCI of the RCA   . Ischemic dilated cardiomyopathy   . HTN (hypertension)   . GERD (gastroesophageal reflux disease)   . Personal history of DVT (deep vein thrombosis)   . Diabetes mellitus, type 2 ([place])   . DJD (degenerative joint disease)   . Gout   . Prostatitis   . Nephrolithiasis   . Atrial fibrillation ([place])   . Congestive heart failure, unspecified   . Other primary cardiomyopathies   . COPD (chronic obstructive pulmonary disease) (HCC)     Pt on home O2 at night and PRN, unsure of date of diagnosis.    Assessment: 28 YOM presents with shortness of breath and chest pain thought to be due to acute HF exacerbation and/or COPD. He is on chronic warfarin for afib. INR is therapeutic at time of admission.   Home warfarin dose 2mg  daily except 3mg  on Wed/Sat.  Today's labs for 12/25/2014   INR = 2.85  CBC: Hgb WNL, pltc mildly low  SCr slightly above baseline of ~1.5  Started on ASA 81mg  (not on at home) and prednisone  Goal of Therapy:  INR 2-3 Monitor platelets by anticoagulation protocol: Yes   Plan:    Warfarin 2mg  PO x 1 tonight per home regimen  Daily INR  Monitor for  bleeding  Doreene Eland, PharmD, BCPS.   Pager: [phone] 12/25/2014 11:45 AM

## 2014-12-25 NOTE — ED Provider Notes (Signed)
CSN: [id number]     Arrival date & time 12/25/14  0718 History   First MD Initiated Contact with Patient 12/25/14 [phone]     Chief Complaint  Patient presents with  . Shortness of Breath  . Chest Pain     (Consider location/radiation/quality/duration/timing/severity/associated sxs/prior Treatment) The history is provided by the patient.  KHYAIR BROOKBANK is a 74 y.o. male hx of CAD, ischemic cardiomyopathy, HTN, DVT on coumadin, DM, COPD, here with shortness of breath. Shortness of breath for the last several weeks. Short of breath is worse at night and worse with exertion. Intermittent chest tightness as well. He saw Dr. Lovena Le for cardiology several days ago and was told to add Aldactone in addition to the Lasix. Patient has some nonproductive cough as well. Denies any fevers or abdominal pain. States that he has minimal leg swelling that is not getting worse. He has history atrial fibrillation and is currently on Coumadin. Of note, patient has a EF of 25% and has a biventricular pacemaker.     Past Medical History  Diagnosis Date  . CAD (coronary artery disease) 1996    status post PCI of the RCA   . Ischemic dilated cardiomyopathy   . HTN (hypertension)   . GERD (gastroesophageal reflux disease)   . Personal history of DVT (deep vein thrombosis)   . Diabetes mellitus, type 2 ([place])   . DJD (degenerative joint disease)   . Gout   . Prostatitis   . Nephrolithiasis   . Atrial fibrillation ([place])   . Congestive heart failure, unspecified   . Other primary cardiomyopathies   . COPD (chronic obstructive pulmonary disease) (HCC)     Pt on home O2 at night and PRN, unsure of date of diagnosis.   Past Surgical History  Procedure Laterality Date  . Back surgery    . Coronary stent placement    . Cardiac defibrillator placement    . Pacemaker insertion     Family History  Problem Relation Age of Onset  . Heart disease Father   . Cancer Father     LUNG  . Stroke Paternal Uncle   .  Colon cancer Mother    Social History  Substance Use Topics  . Smoking status: Former Smoker -- 4.00 packs/day for 50 years    Types: Cigarettes    Quit date: 01/22/1980  . Smokeless tobacco: Never Used  . Alcohol Use: No     Comment: denies    Review of Systems  Respiratory: Positive for shortness of breath.   Cardiovascular: Positive for chest pain.  All other systems reviewed and are negative.     Allergies  Benadryl  Home Medications   Prior to Admission medications   Medication Sig Start Date End Date Taking? Authorizing Provider  acetaminophen (TYLENOL) 500 MG tablet Take 500 mg by mouth every 6 (six) hours as needed for mild pain or moderate pain.    Yes Historical Provider, MD  albuterol (ACCUNEB) 1.25 MG/3ML nebulizer solution INHALE ONE VIAL VIA NEBULIZER EVERY 6 HOURS AS NEEDED FOR WHEEZING. 02/09/14  Yes Collene Gobble, MD  alfuzosin (UROXATRAL) 10 MG 24 hr tablet Take 10 mg by mouth daily with breakfast.    Yes Historical Provider, MD  allopurinol (ZYLOPRIM) 100 MG tablet Take 100 mg by mouth daily.   Yes Historical Provider, MD  atorvastatin (LIPITOR) 10 MG tablet Take 1 tablet (10 mg total) by mouth at bedtime. 08/03/14  Yes Jettie Booze, MD  carvedilol (COREG)  12.5 MG tablet Take 1 tablet (12.5 mg total) by mouth 2 (two) times daily with a meal. 11/26/13  Yes Kinnie Feil, MD  digoxin (LANOXIN) 0.125 MG tablet Take 1 tablet (125 mcg total) by mouth daily. 11/26/13  Yes Kinnie Feil, MD  fluticasone (FLONASE) 50 MCG/ACT nasal spray Place 2 sprays into both nostrils at bedtime.  04/07/13  Yes Historical Provider, MD  furosemide (LASIX) 40 MG tablet Take 80 mg (2 tablets) in the AM and 40 mg (1 tablet) in the afternoon 11/04/14  Yes Jettie Booze, MD  guaiFENesin-dextromethorphan [facility] DM) 100-10 MG/5ML syrup Take 5 mLs by mouth every 4 (four) hours as needed for cough. 11/26/13  Yes Kinnie Feil, MD  hydrALAZINE (APRESOLINE) 25 MG tablet  Take 1 tablet (25 mg total) by mouth 3 (three) times daily. 04/25/14  Yes Imogene Burn, PA-C  loratadine (CLARITIN) 10 MG tablet Take 10 mg by mouth daily. For allergies   Yes Historical Provider, MD  losartan (COZAAR) 100 MG tablet Take 100 mg by mouth daily.  03/30/14  Yes Historical Provider, MD  mometasone-formoterol (DULERA) 100-5 MCG/ACT AERO Inhale 2 puffs into the lungs 2 (two) times daily. 11/26/13  Yes Kinnie Feil, MD  omeprazole (PRILOSEC) 40 MG capsule Take 40 mg by mouth at bedtime.    Yes Historical Provider, MD  potassium chloride SA (K-DUR,KLOR-CON) 20 MEQ tablet Take 1 tablet (20 mEq total) by mouth daily. 09/24/14  Yes Milton Ferguson, MD  PROAIR HFA 108 (90 BASE) MCG/ACT inhaler INHALE 2 PUFFS EVERY 4 HOURS AS NEEDED FOR WHEEZING OR SHORTNESS OF BREATH. 09/05/14  Yes Collene Gobble, MD  spironolactone (ALDACTONE) 25 MG tablet Take 1 tablet (25 mg total) by mouth daily. 12/19/14  Yes Evans Lance, MD  warfarin (COUMADIN) 1 MG tablet Take 2-3 mg by mouth daily with supper. Take 3 tablets (3 mg) on Wednesday and Saturday, take 2 tablets (2 mg) on Sunday, Monday, Tuesday, Thursday, Friday   Yes Historical Provider, MD   BP 150/55 mmHg  Pulse 82  Resp 26  SpO2 99% Physical Exam  Constitutional: He is oriented to person, place, and time.  Short of breath   HENT:  Head: Normocephalic.  Mouth/Throat: Oropharynx is clear and moist.  Eyes: Conjunctivae are normal. Pupils are equal, round, and reactive to light.  Neck: Normal range of motion. Neck supple.  Cardiovascular: Normal rate, regular rhythm and normal heart sounds.   Pulmonary/Chest:  Tachypneic, diminished throughout. No obvious wheezing   Abdominal: Soft. Bowel sounds are normal. He exhibits no distension. There is no tenderness. There is no rebound.  Musculoskeletal:  1+ edema bilaterally   Neurological: He is alert and oriented to person, place, and time.  Skin: Skin is warm and dry.  Psychiatric: He has a normal  mood and affect. His behavior is normal. Judgment and thought content normal.  Nursing note and vitals reviewed.   ED Course  Procedures (including critical care time) Labs Review Labs Reviewed  CBC WITH DIFFERENTIAL/PLATELET - Abnormal; Notable for the following:    MCV 104.5 (*)    RDW 16.5 (*)    Platelets 141 (*)    Monocytes Absolute 1.1 (*)    All other components within normal limits  COMPREHENSIVE METABOLIC PANEL - Abnormal; Notable for the following:    Chloride 100 (*)    CO2 35 (*)    Glucose, Bld 121 (*)    BUN 23 (*)    Creatinine, Ser  1.76 (*)    ALT 16 (*)    GFR calc non Af Amer 36 (*)    GFR calc Af Amer 42 (*)    All other components within normal limits  PROTIME-INR - Abnormal; Notable for the following:    Prothrombin Time 29.5 (*)    INR 2.85 (*)    All other components within normal limits  DIGOXIN LEVEL  BRAIN NATRIURETIC PEPTIDE  I-STAT TROPOININ, ED    Imaging Review Dg Chest 2 View  12/25/2014  CLINICAL DATA:  Shortness of breath. EXAM: CHEST - 2 VIEW COMPARISON:  09/26/2014 FINDINGS: The heart size and mediastinal contours are within normal limits. Stable appearance of a biventricular pacing/ ICD device. Probable chronic pulmonary venous hypertension without overt edema. Mild atelectasis at the right lung base present. No airspace consolidation, pneumothorax or pleural effusions. The visualized skeletal structures are unremarkable. IMPRESSION: Stable chronic pulmonary venous hypertension. Right basilar atelectasis. Electronically Signed   By: Aletta Edouard M.D.   On: 12/25/2014 08:10   I have personally reviewed and evaluated these images and lab results as part of my medical decision-making.   EKG Interpretation   Date/Time:  Sunday December 25 2014 07:29:53 EST Ventricular Rate:  72 PR Interval:  197 QRS Duration: 126 QT Interval:  413 QTC Calculation: 452 R Axis:   -135 Text Interpretation:  A-V dual-paced complexes w/ some inhibition  No  further analysis attempted due to paced rhythm Baseline wander in lead(s)  II paced  Confirmed by Nicholad Kautzman  MD, Miqueas Whilden ([id number]) on 12/25/2014 7:39:41 AM      MDM   Final diagnoses:  None    JAMESJOSEPH BAUN is a 74 y.o. male here with shortness of breath. Consider ACS vs CHF vs COPD. Will get labs, trop, CXR, BNP. Patient hypoxic to 78% on RA and not on oxygen at home so will need admission. Will give nebs and steroids as well.   9:30 AM CXR showed venous congestion. Cr at baseline. Given lasix, duoneb, solumedrol. Lung exam improved, now minimal wheezing. Will give another neb and admit for hypoxia from CHF/COPD. I consulted Dr. Rayann Heman from cardiology, who wants medicine to admit and diurese and call if the patient doesn't improve clinically.    Wandra Arthurs, MD 12/25/14 [phone]

## 2014-12-25 NOTE — ED Notes (Signed)
Made respiratory aware of neb treatment ordered.  Tech unavailable at this time to administer, so RN will give.

## 2014-12-25 NOTE — H&P (Signed)
History and Physical:    Troy [name]   [id number] DOB: Oct 02, 1940 DOA: 12/25/2014  Referring MD/provider: Dr. Shirlyn Goltz PCP: Barbette Merino, MD   Chief Complaint: Dyspnea  History of Present Illness:   Troy [name] is an 74 y.o. male with a PMH of CAD, ischemic cardiomyopathy with chronic systolic CHF and EF of 123456 status post biventricular pacemaker, HTN, paroxysmal atrial fibrillation on coumadin, DM, and COPD, who presented to the ED today with chief complaint of a few week history of gradually worsening shortness of breath. Shortness of breath is worse at night and worse with exertion, although he says he has been short of breath for at least one year. He also endorses intermittent chest tightness but no frank chest pains. He saw Dr. Lovena Le of cardiology 12/19/14 and Aldactone was added to his usual dose of Lasix, but has had ongoing symptoms despite this medication change. Patient has some nonproductive cough as well. Denies any fevers or abdominal pain. States that he has minimal leg swelling that is not getting worse. It looks that on a previous admission, home oxygen was recommended but he denies that he is on any home oxygen. Denies excessive salt intake. He lives alone and ambulates with a cane at baseline. He does not have any family or any help at home.  ROS:   Review of Systems  Constitutional: Positive for chills, weight loss and malaise/fatigue. Negative for fever and diaphoresis.  HENT: Negative for congestion and sore throat.   Eyes: Negative.        Disconjugate gaze   Respiratory: Positive for cough, shortness of breath and wheezing. Negative for sputum production.   Cardiovascular: Positive for orthopnea. Negative for palpitations, leg swelling and PND.       Chest tightness  Gastrointestinal: Negative for heartburn, nausea, vomiting, abdominal pain, diarrhea, constipation, blood in stool and melena.  Genitourinary: Negative.   Musculoskeletal:  Negative for myalgias, back pain, joint pain, falls and neck pain.  Skin: Negative.   Neurological: Positive for weakness. Negative for dizziness, sensory change, speech change and headaches.  Endo/Heme/Allergies: Positive for environmental allergies.        Cold intolerance  Psychiatric/Behavioral: Negative for depression. The patient has insomnia. The patient is not nervous/anxious.      Past Medical History:   Past Medical History  Diagnosis Date  . CAD (coronary artery disease) 1996    status post PCI of the RCA   . Ischemic dilated cardiomyopathy   . HTN (hypertension)   . GERD (gastroesophageal reflux disease)   . Personal history of DVT (deep vein thrombosis)   . Diabetes mellitus, type 2 ([place])   . DJD (degenerative joint disease)   . Gout   . Prostatitis   . Nephrolithiasis   . Atrial fibrillation ([place])   . Congestive heart failure, unspecified   . Other primary cardiomyopathies   . COPD (chronic obstructive pulmonary disease) (HCC)     Pt on home O2 at night and PRN, unsure of date of diagnosis.    Past Surgical History:   Past Surgical History  Procedure Laterality Date  . Back surgery    . Coronary stent placement    . Cardiac defibrillator placement    . Pacemaker insertion      Social History:   Social History   Social History  . Marital Status: Single    Spouse Name: N/A  . Number of Children: 0  . Years of Education: N/A   Occupational History  .  Retired from [employer]    Social History Main Topics  . Smoking status: Former Smoker -- 4.00 packs/day for 50 years    Types: Cigarettes    Quit date: 01/22/1980  . Smokeless tobacco: Never Used  . Alcohol Use: No     Comment: denies  . Drug Use: No  . Sexual Activity: Not on file   Other Topics Concern  . Not on file   Social History Narrative   Lives alone.  Single.  No children.  Ambulates with a cane.  Has a deceased brother and is estranged from sisters.    Family history:     Family History  Problem Relation Age of Onset  . Heart disease Father   . Cancer Father     LUNG  . Stroke Paternal Uncle   . Colon cancer Mother     Allergies   Benadryl  Current Medications:   Prior to Admission medications   Medication Sig Start Date End Date Taking? Authorizing Provider  acetaminophen (TYLENOL) 500 MG tablet Take 500 mg by mouth every 6 (six) hours as needed for mild pain or moderate pain.    Yes Historical Provider, MD  albuterol (ACCUNEB) 1.25 MG/3ML nebulizer solution INHALE ONE VIAL VIA NEBULIZER EVERY 6 HOURS AS NEEDED FOR WHEEZING. 02/09/14  Yes Collene Gobble, MD  alfuzosin (UROXATRAL) 10 MG 24 hr tablet Take 10 mg by mouth daily with breakfast.    Yes Historical Provider, MD  allopurinol (ZYLOPRIM) 100 MG tablet Take 100 mg by mouth daily.   Yes Historical Provider, MD  atorvastatin (LIPITOR) 10 MG tablet Take 1 tablet (10 mg total) by mouth at bedtime. 08/03/14  Yes Jettie Booze, MD  carvedilol (COREG) 12.5 MG tablet Take 1 tablet (12.5 mg total) by mouth 2 (two) times daily with a meal. 11/26/13  Yes Kinnie Feil, MD  digoxin (LANOXIN) 0.125 MG tablet Take 1 tablet (125 mcg total) by mouth daily. 11/26/13  Yes Kinnie Feil, MD  fluticasone (FLONASE) 50 MCG/ACT nasal spray Place 2 sprays into both nostrils at bedtime.  04/07/13  Yes Historical Provider, MD  furosemide (LASIX) 40 MG tablet Take 80 mg (2 tablets) in the AM and 40 mg (1 tablet) in the afternoon 11/04/14  Yes Jettie Booze, MD  guaiFENesin-dextromethorphan [facility] DM) 100-10 MG/5ML syrup Take 5 mLs by mouth every 4 (four) hours as needed for cough. 11/26/13  Yes Kinnie Feil, MD  hydrALAZINE (APRESOLINE) 25 MG tablet Take 1 tablet (25 mg total) by mouth 3 (three) times daily. 04/25/14  Yes Imogene Burn, PA-C  loratadine (CLARITIN) 10 MG tablet Take 10 mg by mouth daily. For allergies   Yes Historical Provider, MD  losartan (COZAAR) 100 MG tablet Take 100 mg by mouth  daily.  03/30/14  Yes Historical Provider, MD  mometasone-formoterol (DULERA) 100-5 MCG/ACT AERO Inhale 2 puffs into the lungs 2 (two) times daily. 11/26/13  Yes Kinnie Feil, MD  omeprazole (PRILOSEC) 40 MG capsule Take 40 mg by mouth at bedtime.    Yes Historical Provider, MD  potassium chloride SA (K-DUR,KLOR-CON) 20 MEQ tablet Take 1 tablet (20 mEq total) by mouth daily. 09/24/14  Yes Milton Ferguson, MD  PROAIR HFA 108 (90 BASE) MCG/ACT inhaler INHALE 2 PUFFS EVERY 4 HOURS AS NEEDED FOR WHEEZING OR SHORTNESS OF BREATH. 09/05/14  Yes Collene Gobble, MD  spironolactone (ALDACTONE) 25 MG tablet Take 1 tablet (25 mg total) by mouth daily. 12/19/14  Yes Carleene Overlie  Peyton Najjar, MD  warfarin (COUMADIN) 1 MG tablet Take 2-3 mg by mouth daily with supper. Take 3 tablets (3 mg) on Wednesday and Saturday, take 2 tablets (2 mg) on Sunday, Monday, Tuesday, Thursday, Friday   Yes Historical Provider, MD    Physical Exam:   Filed Vitals:   12/25/14 0945 12/25/14 1000 12/25/14 1044 12/25/14 1324  BP:  160/63 154/61 133/55  Pulse: 64 58 67 64  Temp:   98 F (36.7 C) 98.1 F (36.7 C)  TempSrc:   Oral Oral  Resp: 20 23 19 21   Height:   5\' 5"  (1.651 m)   Weight:   73.8 kg (162 lb 11.2 oz)   SpO2: 100% 100% 94% 95%     Physical Exam: Blood pressure 133/55, pulse 64, temperature 98.1 F (36.7 C), temperature source Oral, resp. rate 21, height 5\' 5"  (1.651 m), weight 73.8 kg (162 lb 11.2 oz), SpO2 95 %. Gen: No acute distress. Head: Normocephalic, atraumatic. Eyes: PERRL, EOMI, sclerae nonicteric. Mouth: Oropharynx clear. Neck: Supple, no thyromegaly, no lymphadenopathy, no jugular venous distention. Chest: Lungs markedly diminished bilaterally with poor air movement. CV: Heart sounds are regular. No murmurs, rubs, or gallops. Abdomen: Soft, nontender, nondistended with normal active bowel sounds. Extremities: Extremities show 1+ edema bilaterally with hemosiderin deposits. Skin: Warm and dry. Neuro: Alert  and oriented times 3; cranial nerves II through XII grossly intact. Psych: Mood and affect mildly anxious.   Data Review:    Labs: Basic Metabolic Panel:  Recent Labs Lab 12/25/14 0807  NA 142  K 3.8  CL 100*  CO2 35*  GLUCOSE 121*  BUN 23*  CREATININE 1.76*  CALCIUM 9.2   Liver Function Tests:  Recent Labs Lab 12/25/14 0807  AST 19  ALT 16*  ALKPHOS 75  BILITOT 1.0  PROT 6.9  ALBUMIN 3.5   CBC:  Recent Labs Lab 12/25/14 0807  WBC 6.6  NEUTROABS 3.7  HGB 14.9  HCT 46.3  MCV 104.5*  PLT 141*   Cardiac Enzymes: No results for input(s): CKTOTAL, CKMB, CKMBINDEX, TROPONINI in the last 168 hours.  BNP (last 3 results)  Recent Labs  04/25/14 1205 05/02/14 1115  PROBNP 2433.0* 1499.0*   CBG: No results for input(s): GLUCAP in the last 168 hours.  Radiographic Studies: Dg Chest 2 View  12/25/2014  CLINICAL DATA:  Shortness of breath. EXAM: CHEST - 2 VIEW COMPARISON:  09/26/2014 FINDINGS: The heart size and mediastinal contours are within normal limits. Stable appearance of a biventricular pacing/ ICD device. Probable chronic pulmonary venous hypertension without overt edema. Mild atelectasis at the right lung base present. No airspace consolidation, pneumothorax or pleural effusions. The visualized skeletal structures are unremarkable. IMPRESSION: Stable chronic pulmonary venous hypertension. Right basilar atelectasis. Electronically Signed   By: Aletta Edouard M.D.   On: 12/25/2014 08:10   *I have personally reviewed the images above*  EKG: Independently reviewed. A-V dual-paced complexes w/ some inhibition. 72 bpm.   Assessment/Plan:   Principal Problem:   Acute on chronic respiratory failure with hypoxia ([place]), multifactorial/dyspnea - Appears to have decompensated CHF in addition to a mild COPD exacerbation. - Placed on supplemental oxygen. See problems below for specific interventions.  Active Problems:   Acute on chronic systolic CHF  (congestive heart failure) (HCC) status post biventricular implantable cardioverter-defibrillator in situ/coronary artery disease of native coronary artery without angina - Heart healthy diet with fluid restriction ordered. - Continue aspirin, Lipitor, Coreg, Lanoxin, hydralazine, Cozaar and increased dose of Lasix  to 40 mg IV twice a day. - Monitor daily weights and I/O. - Last 2-D echo done 11/22/13. EF 20-25 percent. No need to repeat. - Cycle cardiac markers and monitor on telemetry. Initial troponin 0.05.    Paroxysmal Atrial fibrillation - Continue Coumadin. Continue digoxin.    BPH - Continue uroxatral.    Chronic kidney disease, stage 3 - Baseline creatinine 1.3-1.4.  - Current creatinine slightly elevated over usual baseline values, likely from decreased cardiac output. - Monitor renal function closely with diuresis.    Coronary artery disease involving native coronary artery of native heart without angina pectoris   Chronic obstructive pulmonary disease with acute exacerbation (HCC) - Continue nebulized bronchodilator treatments every 6 hours.  - Add Mucinex. Given one dose of Solu-Medrol in the ED. We'll start prednisone.    Failure to thrive - Education officer, museum consulted for assistance with placement to an ALF.    DVT prophylaxis - On coumadin.  Code Status / Family Communication / Disposition Plan:   Code Status: Full. Family Communication: No family (parents deceased, brother deceased, estranged from sisters). Disposition Plan: Needs ALF placement.  Attestation regarding necessity of inpatient status:   The appropriate admission status for this patient is INPATIENT. Inpatient status is judged to be reasonable and necessary in order to provide the required intensity of service to ensure the patient's safety. The patient's presenting symptoms, physical exam findings, and initial radiographic and laboratory data in the context of their chronic comorbidities is felt to place  them at high risk for further clinical deterioration. Furthermore, it is not anticipated that the patient will be medically stable for discharge from the hospital within 2 midnights of admission. The following factors support the admission status of inpatient.   -The patient's presenting symptoms include dyspnea, chest tightness, activity intolerance. - The worrisome physical exam findings include markedly diminished breath sounds. - The initial radiographic and laboratory data are worrisome because of elevated BNP, elevated creatinine over usual baseline values. - The chronic co-morbidities include chronic systolic CHF with EF 123XX123 percent, paroxysmal atrial fibrillation, COPD. - Patient requires inpatient status due to high intensity of service, high risk for further deterioration and high frequency of surveillance required. - I certify that at the point of admission it is my clinical judgment that the patient will require inpatient hospital care spanning beyond 2 midnights from the point of admission.   Time spent: 1 hour.  Luca Burston [facility] Pager [phone] Cell: [phone]   If 7PM-7AM, please contact night-coverage [email or web address] Password [email or web address] 12/25/2014, 3:32 PM

## 2014-12-25 NOTE — ED Notes (Signed)
Nurse at bedside start iv/collecting labs

## 2014-12-25 NOTE — ED Notes (Addendum)
Pt c/o increasing SOB and chest tightness X "a couple weeks."  Pain score 4/10.  Hx of CHF.  Denies peripheral edema and GU complaints.  Denies home O2.

## 2014-12-26 DIAGNOSIS — J9621 Acute and chronic respiratory failure with hypoxia: Secondary | ICD-10-CM

## 2014-12-26 DIAGNOSIS — I480 Paroxysmal atrial fibrillation: Secondary | ICD-10-CM

## 2014-12-26 DIAGNOSIS — I2510 Atherosclerotic heart disease of native coronary artery without angina pectoris: Secondary | ICD-10-CM

## 2014-12-26 DIAGNOSIS — I5023 Acute on chronic systolic (congestive) heart failure: Principal | ICD-10-CM

## 2014-12-26 LAB — TROPONIN I: Troponin I: 0.06 ng/mL — ABNORMAL HIGH (ref <0.031)

## 2014-12-26 LAB — BASIC METABOLIC PANEL
Anion gap: 6 (ref 5–15)
BUN: 29 mg/dL — ABNORMAL HIGH (ref 6–20)
CHLORIDE: 100 mmol/L — AB (ref 101–111)
CO2: 36 mmol/L — AB (ref 22–32)
CREATININE: 1.67 mg/dL — AB (ref 0.61–1.24)
Calcium: 8.7 mg/dL — ABNORMAL LOW (ref 8.9–10.3)
GFR calc non Af Amer: 39 mL/min — ABNORMAL LOW (ref >60)
GFR, EST AFRICAN AMERICAN: 45 mL/min — AB (ref >60)
Glucose, Bld: 148 mg/dL — ABNORMAL HIGH (ref 65–99)
POTASSIUM: 4.3 mmol/L (ref 3.5–5.1)
SODIUM: 142 mmol/L (ref 135–145)

## 2014-12-26 LAB — PROTIME-INR
INR: 3.67 — AB (ref 0.00–1.49)
PROTHROMBIN TIME: 35.6 s — AB (ref 11.6–15.2)

## 2014-12-26 MED ORDER — FUROSEMIDE 10 MG/ML IJ SOLN
60.0000 mg | Freq: Two times a day (BID) | INTRAMUSCULAR | Status: DC
  Administered 2014-12-26 (×2): 60 mg via INTRAVENOUS
  Filled 2014-12-26 (×2): qty 6

## 2014-12-26 NOTE — Progress Notes (Addendum)
Pt with 16-beat run of vtach. Pt asymptomatic, pacemaker kicked in and back to normal paced HR. MD notified,no new orders given. Alarm strip printed and placed in chart. Will continue to monitor.

## 2014-12-26 NOTE — Clinical Documentation Improvement (Signed)
  Internal Medicine  Can the diagnosis of acute renal failure be further specified?   Acute Renal Failure/Acute Kidney Injury  Acute Tubular Necrosis  Acute Renal Cortical Necrosis  Acute Renal Medullary Necrosis  Acute on Chronic Renal Failure  Other  Clinically Undetermined  Document any associated diagnoses/conditions.   Supporting Information: Documented that creatinine level baseline 1.3 - 1.4.  Current creatinine slightly elevated over usual baselines, likely from decreased cardiac output.  Creatinine 1.76 on admission and on 12/5 is 1.67.  Please exercise your independent, professional judgment when responding. A specific answer is not anticipated or expected.   Thank You,  [place] [phone]

## 2014-12-26 NOTE — Progress Notes (Signed)
ANTICOAGULATION CONSULT NOTE - Follow Up  Pharmacy Consult for warfarin Indication: atrial fibrillation  Allergies  Allergen Reactions  . Benadryl [Diphenhydramine Hcl] Nausea And Vomiting    Patient Measurements: Height: 5\' 5"  (165.1 cm) Weight: 162 lb 11.2 oz (73.8 kg) IBW/kg (Calculated) : 61.5  Vital Signs: Temp: 97.9 F (36.6 C) (12/05 0430) Temp Source: Oral (12/05 0430) BP: 147/57 mmHg (12/05 0430) Pulse Rate: 71 (12/05 0430)  Labs:  Recent Labs  12/25/14 0807 12/25/14 1641 12/25/14 2259 12/26/14 0410  HGB 14.9  --   --   --   HCT 46.3  --   --   --   PLT 141*  --   --   --   LABPROT 29.5*  --   --  35.6*  INR 2.85*  --   --  3.67*  CREATININE 1.76*  --   --  1.67*  TROPONINI  --  0.08* 0.07* 0.06*    Estimated Creatinine Clearance: 36.4 mL/min (by C-G formula based on Cr of 1.67).  Assessment: 60 YOM presents with shortness of breath and chest pain thought to be due to acute HF exacerbation and/or COPD. He is on chronic warfarin for afib. INR is therapeutic at time of admission.   Home warfarin dose 2mg  daily except 3mg  on Wed/Sat.  Today 12/26/2014   INR supratherapeutic = 3.67  CBC: Hgb WNL, pltc mildly low yesterday  SCr slightly above baseline of ~1.5  Started on ASA 81mg  (not on at home) and prednisone  Goal of Therapy:  INR 2-3 Monitor platelets by anticoagulation protocol: Yes   Plan:    Hold warfarin today  Daily INR  Monitor for bleeding  Peggyann Juba, PharmD, BCPS Pager: [phone]  12/26/2014 9:01 AM

## 2014-12-26 NOTE — NC FL2 (Signed)
[place] LEVEL OF CARE SCREENING TOOL     IDENTIFICATION  Patient Name: Troy [name] Birthdate: 11-Jun-1940 Sex: male Admission Date (Current Location): 12/25/2014  [facility] and [place] Number: Engineer, manufacturing systems and Address:  [facility],  [place] [address] [place]      Provider Number: [id number]  Attending Physician Name and Address:  Venetia Maxon Rama, MD  Relative Name and Phone Number:       Current Level of Care: Hospital Recommended Level of Care: [place] Prior Approval Number:    Date Approved/Denied:   PASRR Number: [id number]  Discharge Plan: SNF    Current Diagnoses: Patient Active Problem List   Diagnosis Date Noted  . AKI (acute kidney injury) ([place]) 12/26/2014  . BPH (benign prostatic hyperplasia) 12/25/2014  . FTT (failure to thrive) in adult 12/25/2014  . Encounter for therapeutic drug monitoring 08/05/2014  . Pulmonary nodules 12/23/2013  . Hemoptysis   . Dyspnea   . Acute on chronic systolic CHF (congestive heart failure) ([place])   . Chronic kidney disease, stage 3   . Coronary artery disease involving native coronary artery of native heart without angina pectoris   . Paroxysmal atrial fibrillation (HCC)   . Frequent PVCs   . Hypoxia   . Acute on chronic respiratory failure with hypoxia ([place])   . Chronic obstructive pulmonary disease with acute exacerbation ([place])   . COPD (chronic obstructive pulmonary disease) ([place]) 06/16/2013  . Lipoma of neck 09/11/2011  . 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 Coronary artery disease 01/30/2011  . Atrial fibrillation ([place]) 01/30/2011  . Chronic systolic CHF (congestive heart failure) ([place])   . Other primary cardiomyopathies   . Diverticulosis of colon with hemorrhage 08/28/2010    Orientation ACTIVITIES/SOCIAL BLADDER RESPIRATION    Self, Time, Situation, Place  Active Continent O2 (As needed) (3L continuous  O2)  BEHAVIORAL SYMPTOMS/MOOD NEUROLOGICAL BOWEL NUTRITION STATUS     (NONE) Continent Diet (Diet Heart)  PHYSICIAN VISITS COMMUNICATION OF NEEDS Height & Weight Skin    Verbally 5\' 5"  (165.1 cm) 162 lbs. Normal          AMBULATORY STATUS RESPIRATION    Supervision limited (min assist, +2 for safety/equipment) O2 (As needed) (3L continuous O2)      Personal Care Assistance Level of Assistance  Bathing, Feeding, Dressing Bathing Assistance: Limited assistance Feeding assistance: Independent Dressing Assistance: Limited assistance      Functional Limitations Info  Sight, Hearing, Speech Sight Info: Impaired Hearing Info: Adequate Speech Info: Adequate       SPECIAL CARE FACTORS FREQUENCY  PT (By licensed PT), OT (By licensed OT)     PT Frequency: 5 x a week OT Frequency: 5 x a week           Additional Factors Info  Code Status, Allergies Code Status Info: FULL code status Allergies Info: Benadryl           Current Medications (12/26/2014):  This is the current hospital active medication list Current Facility-Administered Medications  Medication Dose Route Frequency Provider Last Rate Last Dose  . 0.9 %  sodium chloride infusion  250 mL Intravenous PRN Venetia Maxon Rama, MD      . acetaminophen (TYLENOL) tablet 650 mg  650 mg Oral Q4H PRN Christina P Rama, MD      . alfuzosin (UROXATRAL) 24 hr tablet 10 mg  10 mg Oral Q breakfast Venetia Maxon Rama, MD   10 mg  at 12/26/14 0944  . allopurinol (ZYLOPRIM) tablet 100 mg  100 mg Oral Daily Venetia Maxon Rama, MD   100 mg at 12/26/14 0944  . antiseptic oral rinse (CPC / CETYLPYRIDINIUM CHLORIDE 0.05%) solution 7 mL  7 mL Mouth Rinse BID Venetia Maxon Rama, MD   7 mL at 12/25/14 2115  . aspirin EC tablet 81 mg  81 mg Oral Daily Venetia Maxon Rama, MD   81 mg at 12/26/14 0944  . atorvastatin (LIPITOR) tablet 10 mg  10 mg Oral QHS Venetia Maxon Rama, MD   10 mg at 12/25/14 2113  . carvedilol (COREG) tablet 12.5 mg  12.5 mg Oral BID  WC Venetia Maxon Rama, MD   12.5 mg at 12/26/14 1748  . digoxin (LANOXIN) tablet 125 mcg  125 mcg Oral Daily Venetia Maxon Rama, MD   125 mcg at 12/26/14 0944  . fluticasone (FLONASE) 50 MCG/ACT nasal spray 2 spray  2 spray Each Nare QHS Venetia Maxon Rama, MD   2 spray at 12/25/14 2114  . furosemide (LASIX) injection 60 mg  60 mg Intravenous BID Venetia Maxon Rama, MD   60 mg at 12/26/14 1748  . guaiFENesin (MUCINEX) 12 hr tablet 600 mg  600 mg Oral BID Venetia Maxon Rama, MD   600 mg at 12/26/14 0943  . guaiFENesin-dextromethorphan (ROBITUSSIN DM) 100-10 MG/5ML syrup 5 mL  5 mL Oral Q4H PRN Venetia Maxon Rama, MD   5 mL at 12/26/14 1500  . hydrALAZINE (APRESOLINE) tablet 25 mg  25 mg Oral TID Venetia Maxon Rama, MD   25 mg at 12/26/14 1748  . Influenza vac split quadrivalent PF (FLUARIX) injection 0.5 mL  0.5 mL Intramuscular Tomorrow-1000 Christina P Rama, MD      . ipratropium-albuterol (DUONEB) 0.5-2.5 (3) MG/3ML nebulizer solution 3 mL  3 mL Nebulization TID Venetia Maxon Rama, MD   3 mL at 12/26/14 1446  . ipratropium-albuterol (DUONEB) 0.5-2.5 (3) MG/3ML nebulizer solution 3 mL  3 mL Nebulization Q6H PRN Christina P Rama, MD      . loratadine (CLARITIN) tablet 10 mg  10 mg Oral Daily Venetia Maxon Rama, MD   10 mg at 12/26/14 0943  . losartan (COZAAR) tablet 100 mg  100 mg Oral Daily Venetia Maxon Rama, MD   100 mg at 12/26/14 0945  . mometasone-formoterol (DULERA) 100-5 MCG/ACT inhaler 2 puff  2 puff Inhalation BID Venetia Maxon Rama, MD   2 puff at 12/26/14 [phone]  . ondansetron (ZOFRAN) injection 4 mg  4 mg Intravenous Q6H PRN Christina P Rama, MD      . pantoprazole (PROTONIX) EC tablet 40 mg  40 mg Oral Daily Venetia Maxon Rama, MD   40 mg at 12/26/14 0944  . pneumococcal 23 valent vaccine (PNU-IMMUNE) injection 0.5 mL  0.5 mL Intramuscular Tomorrow-1000 Christina P Rama, MD      . potassium chloride SA (K-DUR,KLOR-CON) CR tablet 20 mEq  20 mEq Oral BID Venetia Maxon Rama, MD   20 mEq at 12/26/14 0944  .  predniSONE (DELTASONE) tablet 40 mg  40 mg Oral QAC breakfast Venetia Maxon Rama, MD   40 mg at 12/26/14 0943  . sodium chloride 0.9 % injection 3 mL  3 mL Intravenous Q12H Venetia Maxon Rama, MD   3 mL at 12/26/14 1000  . sodium chloride 0.9 % injection 3 mL  3 mL Intravenous PRN Venetia Maxon Rama, MD      . spironolactone (ALDACTONE) tablet 25 mg  25 mg Oral Daily  Venetia Maxon Rama, MD   25 mg at 12/26/14 0944  . Warfarin - Pharmacist Dosing Inpatient   Does not apply q1800 Berton Mount, RPH   0  at 12/25/14 1800     Discharge Medications: Please see discharge summary for a list of discharge medications.  Relevant Imaging Results:  Relevant Lab Results:  Recent Labs    Additional Information SSN: [id number]  Kuron Docken A, LCSW

## 2014-12-26 NOTE — Evaluation (Signed)
Physical Therapy Evaluation Patient Details Name: Troy [name] MRN: [id number] DOB: 02/23/1940 Today's Date: 12/26/2014   History of Present Illness  Patient 74 year old male with history of coronary artery disease,  ischemic cardiomyopathy, hypertension, DVT, diabetes mellitus, atrial fibrillation, chronic systolic heart failure, COPD, ejection fraction was 20-25% with diffuse hypokinesis.  He has a Best boy.  He was admitted 12/25/14 with increased SOB, chest tightness.  Clinical Impression  Pt admitted with above diagnosis. Pt currently with functional limitations due to the deficits listed below (see PT Problem List).  Pt will benefit from skilled PT to increase their independence and safety with mobility to allow discharge to the venue listed below.   Patient was very anxious about being in hospital again. Patient reports very little support system. Patient agreeable to consider  Rehab. Sats dropped to low 90 on RA while walking a short distance. RT in for treatment who will place on oxygen.     Follow Up Recommendations SNF;Supervision/Assistance - 24 hour    Equipment Recommendations  Rolling walker with 5" wheels    Recommendations for Other Services       Precautions / Restrictions Precautions Precautions: Fall Precaution Comments: monitor VS      Mobility  Bed Mobility Overal bed mobility: Needs Assistance Bed Mobility: Sit to Supine       Sit to supine: Min guard   General bed mobility comments:  able to get legs onto bed.  Transfers Overall transfer level: Needs assistance Equipment used: Rolling walker (2 wheeled) Transfers: Sit to/from Stand Sit to Stand: Min assist         General transfer comment: cues for safety,   Ambulation/Gait Ambulation/Gait assistance: Min assist;+2 safety/equipment Ambulation Distance (Feet): 90 Feet Assistive device: Rolling walker (2 wheeled) Gait Pattern/deviations: Step-to pattern;Step-through  pattern;Trunk flexed Gait velocity: cues for  RW use, position and  safety   General Gait Details: multiple rest stops standing for SOB, sats being monitore on RA initially > 94, gradually dropped  to 90, noted dyspnea 3/4. RT in for treatment.  Stairs            Wheelchair Mobility    Modified Rankin (Stroke Patients Only)       Balance Overall balance assessment: Needs assistance Sitting-balance support: Feet supported;No upper extremity supported Sitting balance-[name]: Fair     Standing balance support: During functional activity;Bilateral upper extremity supported Standing balance-[name]: Fair                               Pertinent Vitals/Pain      Home Living Family/patient expects to be discharged to:: Private residence Living Arrangements: Alone Available Help at Discharge: Family Type of Home: Apartment Home Access: Elevator       Home Equipment: Kasandra Knudsen - single point      Prior Function Level of Independence: Independent         Comments: drive     Hand Dominance        Extremity/Trunk Assessment   Upper Extremity Assessment: Generalized weakness           Lower Extremity Assessment: Generalized weakness      Cervical / Trunk Assessment: Kyphotic  Communication      Cognition Arousal/Alertness: Awake/alert Behavior During Therapy: Anxious;Restless Overall Cognitive Status: Within Functional Limits for tasks assessed  General Comments      Exercises        Assessment/Plan    PT Assessment Patient needs continued PT services  PT Diagnosis Difficulty walking;Generalized weakness   PT Problem List Decreased strength;Decreased activity tolerance;Decreased balance;Decreased mobility;Cardiopulmonary status limiting activity;Decreased safety awareness  PT Treatment Interventions DME instruction;Gait training;Functional mobility training;Therapeutic activities;Therapeutic  exercise;Patient/family education   PT Goals (Current goals can be found in the Care Plan section) Acute Rehab PT Goals Patient Stated Goal: I want to not be sick anymore.  PT Goal Formulation: With patient Time For Goal Achievement: 01/09/15 Potential to Achieve Goals: Good    Frequency Min 3X/week   Barriers to discharge Decreased caregiver support      Co-evaluation               End of Session Equipment Utilized During Treatment: Gait belt Activity Tolerance: Treatment limited secondary to medical complications (Comment) (DOE,) Patient left: in bed;with call bell/phone within reach;with bed alarm set Nurse Communication: Mobility status         Time: [id number] PT Time Calculation (min) (ACUTE ONLY): 35 min   Charges:   PT Evaluation $Initial PT Evaluation Tier I: 1 Procedure PT Treatments $Gait Training: 8-22 mins   PT G CodesClaretha Cooper 12/26/2014, 5:55 PM

## 2014-12-26 NOTE — Progress Notes (Addendum)
Progress Note   Troy [name] [id number] DOB: 07-23-1940 DOA: 12/25/2014 PCP: Troy Merino, MD   Brief Narrative:   Troy [name] is an 74 y.o. male with a PMH of CAD, ischemic cardiomyopathy with chronic systolic CHF and EF of [id number] status post biventricular pacemaker, HTN, paroxysmal atrial fibrillation on coumadin, DM, and COPD, who was admitted 12/25/14 with acute on chronic systolic CHF after failing outpatient therapy with the addition of Aldactone by Dr. Lovena [name] on 12/19/14.   Assessment/Plan:   Principal Problem:  Acute on chronic respiratory failure with hypoxia ([place]), multifactorial/dyspnea - Appears to have decompensated CHF in addition to a mild COPD exacerbation. - Placed on supplemental oxygen. See problems below for specific interventions.  Active Problems:  Acute on chronic systolic CHF (congestive heart failure) (HCC) status post biventricular implantable cardioverter-defibrillator in situ/coronary artery disease of native coronary artery without angina - Heart healthy diet with fluid restriction ordered. - Continue aspirin, Lipitor, Coreg, Lanoxin, hydralazine, Cozaar and increase dose of Lasix to 60 mg IV twice a day. - I/O- 600 mL and weight the same. - Last 2-D echo done 11/22/13. EF 20-25 percent. No need to repeat. - Troponins mildly elevated, likely secondary to demand ischemia. - Cardiology consultation requested.   Paroxysmal Atrial fibrillation - Continue Coumadin. Continue digoxin.   BPH - Continue uroxatral.   Acute kidney injury in the setting of chronic kidney disease, stage 3 - Baseline creatinine 1.3-1.4.  - Current creatinine slightly improved but remains elevated over usual baseline values, likely from decreased cardiac output. - Monitor renal function closely with diuresis.   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  - Add Mucinex. Given one dose of Solu-Medrol in the ED. Continue prednisone.   Failure to thrive - Education officer, museum consulted for assistance with placement to an ALF.   DVT prophylaxis - On coumadin.   Family Communication/Anticipated D/C date and plan/Code Status   Family Communication: The patient has no family, his brother is deceased and he is estranged from his sisters. Disposition Plan: Needs ALF vs. SNF. Anticipated D/C date:   2-3 days when CHF compensated. Code Status: Full code.   IV Access:    Peripheral IV   Procedures and diagnostic studies:   Dg Chest 2 View  12/25/2014  CLINICAL DATA:  Shortness of breath. EXAM: CHEST - 2 VIEW COMPARISON:  09/26/2014 FINDINGS: The heart size and mediastinal contours are within normal limits. Stable appearance of a biventricular pacing/ ICD device. Probable chronic pulmonary venous hypertension without overt edema. Mild atelectasis at the right lung base present. No airspace consolidation, pneumothorax or pleural effusions. The visualized skeletal structures are unremarkable. IMPRESSION: Stable chronic pulmonary venous hypertension. Right basilar atelectasis. Electronically Signed   By: Troy [name] M.D.   On: 12/25/2014 08:10     Medical Consultants:    Cardiology  Anti-Infectives:   Anti-infectives    None      Subjective:   Troy [name] says he feels weak and still feels short of breath.  His appetite is diminished.  No chest pain, N/V.  Bowels moved this a.m.  Objective:    Filed Vitals:   12/25/14 1324 12/25/14 1938 12/25/14 2142 12/26/14 0430  BP: 133/55  138/51 147/57  Pulse: 64  60 71  Temp: 98.1 F (36.7 C)  97.7 F (36.5 C) 97.9 F (36.6  C)  TempSrc: Oral  Oral Oral  Resp: 21  18 18   Height:      Weight:    73.8 kg (162 lb 11.2 oz)  SpO2: 95% 96% 96% 98%    Intake/Output Summary (Last 24 hours) at 12/26/14  0737 Last data filed at 12/26/14 [id number]  Gross per 24 hour  Intake    240 ml  Output    900 ml  Net   -660 ml   Filed Weights   12/25/14 1044 12/26/14 0430  Weight: 73.8 kg (162 lb 11.2 oz) 73.8 kg (162 lb 11.2 oz)    Exam: Gen:  NAD Cardiovascular:  RRR, No M/R/G Respiratory:  Lungs markedly diminished Gastrointestinal:  Abdomen soft, NT/ND, + BS Extremities:  No C/E/C   Data Reviewed:    Labs: Basic Metabolic Panel:  Recent Labs Lab 12/25/14 0807 12/26/14 0410  NA 142 142  K 3.8 4.3  CL 100* 100*  CO2 35* 36*  GLUCOSE 121* 148*  BUN 23* 29*  CREATININE 1.76* 1.67*  CALCIUM 9.2 8.7*   GFR Estimated Creatinine Clearance: 36.4 mL/min (by C-G formula based on Cr of 1.67). Liver Function Tests:  Recent Labs Lab 12/25/14 0807  AST 19  ALT 16*  ALKPHOS 75  BILITOT 1.0  PROT 6.9  ALBUMIN 3.5   Coagulation profile  Recent Labs Lab 12/25/14 0807 12/26/14 0410  INR 2.85* 3.67*    CBC:  Recent Labs Lab 12/25/14 0807  WBC 6.6  NEUTROABS 3.7  HGB 14.9  HCT 46.3  MCV 104.5*  PLT 141*   Cardiac Enzymes:  Recent Labs Lab 12/25/14 1641 12/25/14 2259 12/26/14 0410  TROPONINI 0.08* 0.07* 0.06*   BNP (last 3 results)  Recent Labs  04/25/14 1205 05/02/14 1115  PROBNP 2433.0* 1499.0*    Microbiology No results found for this or any previous visit (from the past 240 hour(s)).   Medications:   . alfuzosin  10 mg Oral Q breakfast  . allopurinol  100 mg Oral Daily  . antiseptic oral rinse  7 mL Mouth Rinse BID  . aspirin EC  81 mg Oral Daily  . atorvastatin  10 mg Oral QHS  . carvedilol  12.5 mg Oral BID WC  . digoxin  125 mcg Oral Daily  . fluticasone  2 spray Each Nare QHS  . furosemide  40 mg Intravenous BID  . guaiFENesin  600 mg Oral BID  . hydrALAZINE  25 mg Oral TID  . Influenza vac split quadrivalent PF  0.5 mL Intramuscular Tomorrow-1000  . ipratropium-albuterol  3 mL Nebulization TID  . loratadine  10 mg Oral Daily  .  losartan  100 mg Oral Daily  . mometasone-formoterol  2 puff Inhalation BID  . pantoprazole  40 mg Oral Daily  . pneumococcal 23 valent vaccine  0.5 mL Intramuscular Tomorrow-1000  . potassium chloride SA  20 mEq Oral BID  . predniSONE  40 mg Oral QAC breakfast  . sodium chloride  3 mL Intravenous Q12H  . spironolactone  25 mg Oral Daily  . Warfarin - Pharmacist Dosing Inpatient   Does not apply q1800   Continuous Infusions:   Time spent: 35 minutes.  The patient is medically complex with multiple co-morbidities and is at high risk for clinical deterioration and requires high complexity decision making.    LOS: 1 day   RAMA,CHRISTINA  [facility] Pager [phone]. If unable to reach me by pager, please call my cell phone at [phone].  *Please refer  to [email or web address], password [email or web address] to get updated schedule on who will round on this patient, as hospitalists switch teams weekly. If 7PM-7AM, please contact night-coverage at [email or web address], password [email or web address] for any overnight needs.  12/26/2014, 7:37 AM

## 2014-12-26 NOTE — Consult Note (Signed)
Cardiologist:  Varanasi/Taylor Reason for Consult:  CHF Referring Physician: Rama  Troy [name] is an 74 y.o. male.  HPI:   Patient 74 year old male with history of coronary artery disease, who had PCI to the RCA in 1996, ischemic cardiomyopathy, hypertension, DVT, diabetes mellitus, atrial fibrillation, chronic systolic heart failure, COPD.  His last 2-D echocardiogram was November 2015 is ejection fraction was 20-25% with diffuse hypokinesis. There was mild AI left atrium was moderately dilated.  He has a Best boy.  He was seen by Dr. Lovena Le on November November 28 was started on Aldactone.  He's had some increase in his atrial fibrillation was mostly in normal sinus rhythm. He does take Coumadin.  Patient presented with dyspnea and cough.  He was weak and could hardly breath.  He also had chest tightness, 8/10, no radiation, which began to ease up after having a breathing treatment.  He sleeps on two pillows but has not been able to sleep because of his breathing.  Some dizziness.  The patient currently denies nausea, vomiting, fever, abdominal pain, hematochezia, melena, lower extremity edema.     12/26/14:          162 lb   12/19/14:        170 lb 06/16/14 176 lb 6.4 oz (80.015 kg)  05/30/14 174 lb 12.8 oz (79.289 kg)  05/02/14 185 lb (83.915 kg)           Past Medical History  Diagnosis Date  . CAD (coronary artery disease) 1996    status post PCI of the RCA   . Ischemic dilated cardiomyopathy   . HTN (hypertension)   . GERD (gastroesophageal reflux disease)   . Personal history of DVT (deep vein thrombosis)   . Diabetes mellitus, type 2 ([place])   . DJD (degenerative joint disease)   . Gout   . Prostatitis   . Nephrolithiasis   . Atrial fibrillation ([place])   . Congestive heart failure, unspecified   . Other primary cardiomyopathies   . COPD (chronic obstructive pulmonary disease) (HCC)     Pt on home O2 at night and PRN, unsure of date of diagnosis.     Past Surgical History  Procedure Laterality Date  . Back surgery    . Coronary stent placement    . Cardiac defibrillator placement    . Pacemaker insertion      Family History  Problem Relation Age of Onset  . Heart disease Father   . Cancer Father     LUNG  . Stroke Paternal Uncle   . Colon cancer Mother     Social History:  reports that he quit smoking about 34 years ago. His smoking use included Cigarettes. He has a 200 pack-year smoking history. He has never used smokeless tobacco. He reports that he does not drink alcohol or use illicit drugs.  Allergies:  Allergies  Allergen Reactions  . Benadryl [Diphenhydramine Hcl] Nausea And Vomiting    Medications:  Scheduled Meds: . alfuzosin  10 mg Oral Q breakfast  . allopurinol  100 mg Oral Daily  . antiseptic oral rinse  7 mL Mouth Rinse BID  . aspirin EC  81 mg Oral Daily  . atorvastatin  10 mg Oral QHS  . carvedilol  12.5 mg Oral BID WC  . digoxin  125 mcg Oral Daily  . fluticasone  2 spray Each Nare QHS  . furosemide  60 mg Intravenous BID  . guaiFENesin  600 mg Oral BID  .  hydrALAZINE  25 mg Oral TID  . Influenza vac split quadrivalent PF  0.5 mL Intramuscular Tomorrow-1000  . ipratropium-albuterol  3 mL Nebulization TID  . loratadine  10 mg Oral Daily  . losartan  100 mg Oral Daily  . mometasone-formoterol  2 puff Inhalation BID  . pantoprazole  40 mg Oral Daily  . pneumococcal 23 valent vaccine  0.5 mL Intramuscular Tomorrow-1000  . potassium chloride SA  20 mEq Oral BID  . predniSONE  40 mg Oral QAC breakfast  . sodium chloride  3 mL Intravenous Q12H  . spironolactone  25 mg Oral Daily  . Warfarin - Pharmacist Dosing Inpatient   Does not apply q1800   Continuous Infusions:  PRN Meds:.sodium chloride, acetaminophen, guaiFENesin-dextromethorphan, ipratropium-albuterol, ondansetron (ZOFRAN) IV, sodium chloride   Results for orders placed or performed during the hospital encounter of 12/25/14 (from the  past 48 hour(s))  CBC with Differential     Status: Abnormal   Collection Time: 12/25/14  8:07 AM  Result Value Ref Range   WBC 6.6 4.0 - 10.5 K/uL   RBC 4.43 4.22 - 5.81 MIL/uL   Hemoglobin 14.9 13.0 - 17.0 g/dL   HCT 46.3 39.0 - 52.0 %   MCV 104.5 (H) 78.0 - 100.0 fL   MCH 33.6 26.0 - 34.0 pg   MCHC 32.2 30.0 - 36.0 g/dL   RDW 16.5 (H) 11.5 - 15.5 %   Platelets 141 (L) 150 - 400 K/uL   Neutrophils Relative % 56 %   Neutro Abs 3.7 1.7 - 7.7 K/uL   Lymphocytes Relative 25 %   Lymphs Abs 1.6 0.7 - 4.0 K/uL   Monocytes Relative 16 %   Monocytes Absolute 1.1 (H) 0.1 - 1.0 K/uL   Eosinophils Relative 2 %   Eosinophils Absolute 0.1 0.0 - 0.7 K/uL   Basophils Relative 1 %   Basophils Absolute 0.0 0.0 - 0.1 K/uL  Comprehensive metabolic panel     Status: Abnormal   Collection Time: 12/25/14  8:07 AM  Result Value Ref Range   Sodium 142 135 - 145 mmol/L   Potassium 3.8 3.5 - 5.1 mmol/L   Chloride 100 (L) 101 - 111 mmol/L   CO2 35 (H) 22 - 32 mmol/L   Glucose, Bld 121 (H) 65 - 99 mg/dL   BUN 23 (H) 6 - 20 mg/dL   Creatinine, Ser 1.76 (H) 0.61 - 1.24 mg/dL   Calcium 9.2 8.9 - 10.3 mg/dL   Total Protein 6.9 6.5 - 8.1 g/dL   Albumin 3.5 3.5 - 5.0 g/dL   AST 19 15 - 41 U/L   ALT 16 (L) 17 - 63 U/L   Alkaline Phosphatase 75 38 - 126 U/L   Total Bilirubin 1.0 0.3 - 1.2 mg/dL   GFR calc non Af Amer 36 (L) >60 mL/min   GFR calc Af Amer 42 (L) >60 mL/min    Comment: (NOTE) The eGFR has been calculated using the CKD EPI equation. This calculation has not been validated in all clinical situations. eGFR's persistently <60 mL/min signify possible Chronic Kidney Disease.    Anion gap 7 5 - 15  Brain natriuretic peptide     Status: Abnormal   Collection Time: 12/25/14  8:07 AM  Result Value Ref Range   B Natriuretic Peptide 1078.9 (H) 0.0 - 100.0 pg/mL  Digoxin level     Status: None   Collection Time: 12/25/14  8:07 AM  Result Value Ref Range   Digoxin Level 1.7  0.8 - 2.0 ng/mL    Protime-INR     Status: Abnormal   Collection Time: 12/25/14  8:07 AM  Result Value Ref Range   Prothrombin Time 29.5 (H) 11.6 - 15.2 seconds   INR 2.85 (H) 0.00 - 1.49  I-stat troponin, ED     Status: None   Collection Time: 12/25/14  8:18 AM  Result Value Ref Range   Troponin i, poc 0.05 0.00 - 0.08 ng/mL   Comment 3            Comment: Due to the release kinetics of cTnI, a negative result within the first hours of the onset of symptoms does not rule out myocardial infarction with certainty. If myocardial infarction is still suspected, repeat the test at appropriate intervals.   Troponin I     Status: Abnormal   Collection Time: 12/25/14  4:41 PM  Result Value Ref Range   Troponin I 0.08 (H) <0.031 ng/mL    Comment:        PERSISTENTLY INCREASED TROPONIN VALUES IN THE RANGE OF 0.04-0.49 ng/mL CAN BE SEEN IN:       -UNSTABLE ANGINA       -CONGESTIVE HEART FAILURE       -MYOCARDITIS       -CHEST TRAUMA       -ARRYHTHMIAS       -LATE PRESENTING MYOCARDIAL INFARCTION       -COPD   CLINICAL FOLLOW-UP RECOMMENDED.   Troponin I     Status: Abnormal   Collection Time: 12/25/14 10:59 PM  Result Value Ref Range   Troponin I 0.07 (H) <0.031 ng/mL    Comment:        PERSISTENTLY INCREASED TROPONIN VALUES IN THE RANGE OF 0.04-0.49 ng/mL CAN BE SEEN IN:       -UNSTABLE ANGINA       -CONGESTIVE HEART FAILURE       -MYOCARDITIS       -CHEST TRAUMA       -ARRYHTHMIAS       -LATE PRESENTING MYOCARDIAL INFARCTION       -COPD   CLINICAL FOLLOW-UP RECOMMENDED.   Troponin I     Status: Abnormal   Collection Time: 12/26/14  4:10 AM  Result Value Ref Range   Troponin I 0.06 (H) <0.031 ng/mL    Comment:        PERSISTENTLY INCREASED TROPONIN VALUES IN THE RANGE OF 0.04-0.49 ng/mL CAN BE SEEN IN:       -UNSTABLE ANGINA       -CONGESTIVE HEART FAILURE       -MYOCARDITIS       -CHEST TRAUMA       -ARRYHTHMIAS       -LATE PRESENTING MYOCARDIAL INFARCTION       -COPD    CLINICAL FOLLOW-UP RECOMMENDED.   Basic metabolic panel     Status: Abnormal   Collection Time: 12/26/14  4:10 AM  Result Value Ref Range   Sodium 142 135 - 145 mmol/L   Potassium 4.3 3.5 - 5.1 mmol/L   Chloride 100 (L) 101 - 111 mmol/L   CO2 36 (H) 22 - 32 mmol/L   Glucose, Bld 148 (H) 65 - 99 mg/dL   BUN 29 (H) 6 - 20 mg/dL   Creatinine, Ser 1.67 (H) 0.61 - 1.24 mg/dL   Calcium 8.7 (L) 8.9 - 10.3 mg/dL   GFR calc non Af Amer 39 (L) >60 mL/min   GFR calc Af Amer 45 (L) >60  mL/min    Comment: (NOTE) The eGFR has been calculated using the CKD EPI equation. This calculation has not been validated in all clinical situations. eGFR's persistently <60 mL/min signify possible Chronic Kidney Disease.    Anion gap 6 5 - 15  Protime-INR     Status: Abnormal   Collection Time: 12/26/14  4:10 AM  Result Value Ref Range   Prothrombin Time 35.6 (H) 11.6 - 15.2 seconds   INR 3.67 (H) 0.00 - 1.49    Dg Chest 2 View  12/25/2014  CLINICAL DATA:  Shortness of breath. EXAM: CHEST - 2 VIEW COMPARISON:  09/26/2014 FINDINGS: The heart size and mediastinal contours are within normal limits. Stable appearance of a biventricular pacing/ ICD device. Probable chronic pulmonary venous hypertension without overt edema. Mild atelectasis at the right lung base present. No airspace consolidation, pneumothorax or pleural effusions. The visualized skeletal structures are unremarkable. IMPRESSION: Stable chronic pulmonary venous hypertension. Right basilar atelectasis. Electronically Signed   By: Aletta Edouard M.D.   On: 12/25/2014 08:10    Review of Systems  Constitutional: Negative for fever and diaphoresis.  HENT: Positive for congestion. Negative for sore throat.   Respiratory: Positive for cough and shortness of breath.   Cardiovascular: Positive for chest pain and orthopnea. Negative for palpitations and leg swelling.  Gastrointestinal: Negative for nausea, vomiting, abdominal pain, blood in stool and  melena.  Genitourinary: Negative for dysuria.  Musculoskeletal: Negative for myalgias.  Neurological: Positive for dizziness.  All other systems reviewed and are negative.  Blood pressure 147/57, pulse 71, temperature 97.9 F (36.6 C), temperature source Oral, resp. rate 18, height '5\' 5"'  (1.651 m), weight 162 lb 11.2 oz (73.8 kg), SpO2 98 %. Physical Exam  Nursing note and vitals reviewed. Constitutional: He is oriented to person, place, and time. He appears well-developed and well-nourished. No distress.  HENT:  Head: Normocephalic and atraumatic.  He las a large ~6CM cyst on the right occipital area.    Eyes: EOM are normal. Pupils are equal, round, and reactive to light. No scleral icterus.  Neck: Normal range of motion. Neck supple. No JVD present.  Cardiovascular: Normal rate, regular rhythm, S1 normal and S2 normal.   No murmur heard. Pulses:      Radial pulses are 2+ on the right side, and 2+ on the left side.       Dorsalis pedis pulses are 2+ on the right side, and 2+ on the left side.  Respiratory: Effort normal. No respiratory distress. He has no wheezes. He has no rales.  .Decreased BS through out.  GI: Soft. Bowel sounds are normal. He exhibits no distension. There is no tenderness.  Musculoskeletal: He exhibits no edema.  Neurological: He is alert and oriented to person, place, and time. He exhibits normal muscle tone.  Skin: Skin is warm and dry.  Psychiatric: He has a normal mood and affect.    Assessment/Plan: Principal Problem:   Acute on chronic respiratory failure with hypoxia (HCC)   Acute on chronic systolic CHF (congestive heart failure) (HCC) Net Fluids -0.7 L.  His JVD does not look elevated.  No edema on CXR.  I agree this is likely a combination of COPD and heart failure(BNP 1078) but I think COPD is a larger contributor.  His weight is 8# lower than his office visit on the 28th.  Recommend changing back to PO lasix.      Chronic obstructive pulmonary  disease with acute exacerbation (HCC)   Elevated troponin  Trending down from 0.08.  CP free.   Likely demand ischemia from [facility] and hypoxemia. O2 sats charted at 78% yesterday.  It eased up after a breathing treatment/prednisone.  I do not think an ischemic eval is needed at the moment.  Consider in the office.    Biventricular implantable cardioverter-defibrillator in situ   Atrial fibrillation (HCC)  AV pacing on telemetry.  CHADSVASC 5 already on Coumadin.    Chronic kidney disease, stage 3  SCr improved slightly with increased BUN.   Coronary artery disease involving native coronary artery of native heart   Dyspnea   BPH (benign prostatic hyperplasia)   FTT (failure to thrive) in adult   Fusako Tanabe, PAC 12/26/2014, 10:45 AM

## 2014-12-27 DIAGNOSIS — I5023 Acute on chronic systolic (congestive) heart failure: Secondary | ICD-10-CM | POA: Diagnosis not present

## 2014-12-27 DIAGNOSIS — N183 Chronic kidney disease, stage 3 (moderate): Secondary | ICD-10-CM

## 2014-12-27 DIAGNOSIS — Z95810 Presence of automatic (implantable) cardiac defibrillator: Secondary | ICD-10-CM

## 2014-12-27 LAB — BASIC METABOLIC PANEL
Anion gap: 7 (ref 5–15)
BUN: 35 mg/dL — ABNORMAL HIGH (ref 6–20)
CALCIUM: 9.1 mg/dL (ref 8.9–10.3)
CO2: 35 mmol/L — AB (ref 22–32)
CREATININE: 1.47 mg/dL — AB (ref 0.61–1.24)
Chloride: 99 mmol/L — ABNORMAL LOW (ref 101–111)
GFR calc Af Amer: 52 mL/min — ABNORMAL LOW (ref >60)
GFR calc non Af Amer: 45 mL/min — ABNORMAL LOW (ref >60)
GLUCOSE: 119 mg/dL — AB (ref 65–99)
Potassium: 4.2 mmol/L (ref 3.5–5.1)
Sodium: 141 mmol/L (ref 135–145)

## 2014-12-27 LAB — PROTIME-INR
INR: 3.88 — ABNORMAL HIGH (ref 0.00–1.49)
Prothrombin Time: 37.2 seconds — ABNORMAL HIGH (ref 11.6–15.2)

## 2014-12-27 MED ORDER — MENTHOL 3 MG MT LOZG
1.0000 | LOZENGE | OROMUCOSAL | Status: DC | PRN
  Filled 2014-12-27: qty 9

## 2014-12-27 MED ORDER — PNEUMOCOCCAL VAC POLYVALENT 25 MCG/0.5ML IJ INJ
0.5000 mL | INJECTION | INTRAMUSCULAR | Status: AC
  Administered 2014-12-28: 0.5 mL via INTRAMUSCULAR
  Filled 2014-12-27 (×2): qty 0.5

## 2014-12-27 MED ORDER — FUROSEMIDE 40 MG PO TABS: 40.0000 mg | ORAL_TABLET | Freq: Every evening | ORAL | Status: DC

## 2014-12-27 MED ORDER — FUROSEMIDE 40 MG PO TABS
80.0000 mg | ORAL_TABLET | Freq: Every day | ORAL | Status: DC
  Administered 2014-12-27 – 2014-12-29 (×3): 80 mg via ORAL
  Filled 2014-12-27 (×3): qty 2

## 2014-12-27 MED ORDER — FUROSEMIDE 40 MG PO TABS
80.0000 mg | ORAL_TABLET | Freq: Every evening | ORAL | Status: DC
  Administered 2014-12-27 – 2014-12-28 (×2): 80 mg via ORAL
  Filled 2014-12-27 (×3): qty 2

## 2014-12-27 MED ORDER — FUROSEMIDE 40 MG PO TABS: 40.0000 mg | ORAL_TABLET | Freq: Two times a day (BID) | ORAL | Status: DC

## 2014-12-27 NOTE — Clinical Social Work Note (Signed)
Clinical Social Work Assessment  Patient Details  Name: Troy [name] MRN: [id number] Date of Birth: 04-20-1940  Date of referral:  12/26/14               Reason for consult:  Discharge Planning                Permission sought to share information with:    Permission granted to share information::  No  Name::        Agency::     Relationship::     Contact Information:     Housing/Transportation Living arrangements for the past 2 months:  Apartment Source of Information:  Patient Patient Interpreter Needed:  None Criminal Activity/Legal Involvement Pertinent to Current Situation/Hospitalization:  No - Comment as needed Significant Relationships:  Friend Lives with:  Self Do you feel safe going back to the place where you live?  No Need for family participation in patient care:  No (Coment)  Care giving concerns:  Pt admitted from home alone. Pt has limited family support. PT evaluated pt and recommended rehab at [facility].    Social Worker assessment / plan:  CSW received referral for ALF placement, however, PT evaluated pt and recommended SNF.  CSW met with pt at bedside. CSW introduced self and explained role. Pt reports that he lives alone. CSW discussed recommendation for rehab at [facility]. Pt in agreement and states that he is familiar with rehab as he has gone to rehab in the past. Pt stated that he did well following rehab, but then began feeling bad again. Support provided as pt discussed that he feels that it is an ongoing cycle with his medical needs and does not feel that he can get ahead of it. Pt discussed that he has limited supports and agreeable to rehab at SNF would be beneficial. Pt states that he was at [facility] in the past, but does not want to return. Pt agreeable to [facility]. SNF search.  CSW completed FL2 and initiated SNF search to [facility]. Pt has [employer] and insurance requires authorization prior to d/c.   CSW to follow up with  pt re: SNF bed offers.  CSW to continue to follow to provide support and assist with pt disposition needs.   Employment status:  Retired Surveyor, [employer] Care PT Recommendations:  [place] / Referral to community resources:  [place]  Patient/Family's Response to care:  Pt alert and oriented x 4. Pt engaged in conversation. Pt agreeable that rehab at SNF would be best plan upon discharge to assist in his recovery.  Patient/Family's Understanding of and Emotional Response to Diagnosis, Current Treatment, and Prognosis:  Pt able to repeat what the MD's have shared with him surrounding his diagnosis and treatment plan.   Emotional Assessment Appearance:  Appears younger than stated age Attitude/Demeanor/Rapport:  Other (pt approriate) Affect (typically observed):  Appropriate Orientation:  Oriented to Self, Oriented to Place, Oriented to  Time, Oriented to Situation Alcohol / Substance use:  Not Applicable Psych involvement (Current and /or in the community):  No (Comment)  Discharge Needs  Concerns to be addressed:  Discharge Planning Concerns Readmission within the last 30 days:  No Current discharge risk:  Lives alone Barriers to Discharge:  Continued Medical Work up   Troy Pier, LCSW 12/27/2014, 1:23 PM  [phone]

## 2014-12-27 NOTE — Progress Notes (Signed)
CSW continuing to follow.   CSW followed up with pt at bedside to discuss SNF bed offers.   CSW reviewed SNF bed offers with pt and pt chooses [facility].   CSW provided support as pt discussed that he feels that he is improving, but feels that it has been a cycle that as soon as he starts to feel better then he feels bad again and has to be seen by the doctor. Pt discussed that he has a limited support system and CSW discussed that [facility] has a Education officer, museum who can continue to assist pt from rehab facility.  CSW contacted [facility] and notified facility of pt acceptance of bed offer. [place] will initiate insurance authorization process with [facility].   CSW to continue to follow to provide support and assist with pt disposition needs when pt medically ready for discharge.  Alison Murray, MSW, [place] Work [phone]

## 2014-12-27 NOTE — Progress Notes (Signed)
Subjective: Feeling better but has some weakness  Objective: Vital signs in last 24 hours: Temp:  [97.6 F (36.4 C)-97.8 F (36.6 C)] 97.8 F (36.6 C) (12/06 [id number]) Pulse Rate:  [52-69] 52 (12/06 0633) Resp:  [18] 18 (12/06 [id number]) BP: (105-126)/(46-89) 123/52 mmHg (12/06 0633) SpO2:  [86 %-97 %] 86 % (12/06 0909) Weight:  [156 lb 8 oz (70.988 kg)] 156 lb 8 oz (70.988 kg) (12/06 [id number]) Last BM Date: 12/26/14  Intake/Output from previous day: 12/05 0701 - 12/06 0700 In: 600 [P.O.:600] Out: 2275 [Urine:2275] Intake/Output this shift:    Medications Scheduled Meds: . alfuzosin  10 mg Oral Q breakfast  . allopurinol  100 mg Oral Daily  . antiseptic oral rinse  7 mL Mouth Rinse BID  . aspirin EC  81 mg Oral Daily  . atorvastatin  10 mg Oral QHS  . carvedilol  12.5 mg Oral BID WC  . digoxin  125 mcg Oral Daily  . fluticasone  2 spray Each Nare QHS  . furosemide  40 mg Oral QPM  . furosemide  80 mg Oral Daily  . guaiFENesin  600 mg Oral BID  . hydrALAZINE  25 mg Oral TID  . ipratropium-albuterol  3 mL Nebulization TID  . loratadine  10 mg Oral Daily  . losartan  100 mg Oral Daily  . mometasone-formoterol  2 puff Inhalation BID  . pantoprazole  40 mg Oral Daily  . [START ON 12/28/2014] pneumococcal 23 valent vaccine  0.5 mL Intramuscular Tomorrow-1000  . potassium chloride SA  20 mEq Oral BID  . predniSONE  40 mg Oral QAC breakfast  . sodium chloride  3 mL Intravenous Q12H  . spironolactone  25 mg Oral Daily  . Warfarin - Pharmacist Dosing Inpatient   Does not apply q1800   Continuous Infusions:  PRN Meds:.sodium chloride, acetaminophen, guaiFENesin-dextromethorphan, ipratropium-albuterol, ondansetron (ZOFRAN) IV, sodium chloride  PE: Constitutional: He is oriented to person, place, and time. He appears well-developed and well-nourished. No distress.  HENT:  Head: Normocephalic and atraumatic.  He las a large ~6CM cyst on the right occipital area.  Eyes: EOM are  normal. Pupils are equal, round, and reactive to light. No scleral icterus. +exotropia Neck: Normal range of motion. Neck supple. No JVD present.  Cardiovascular: Normal rate, regular rhythm, S1 normal and S2 normal. No murmur heard. Pulses:  Radial pulses are 2+ on the right side, and 2+ on the left side.           Dorsalis pedis pulses are 2+ on the right side, and 2+ on the left side.  Respiratory: Effort normal. No respiratory distress. He has no wheezes. He has no rales.    Decreased BS through out.  Musculoskeletal: He exhibits no edema.  Neurological: He is alert and oriented to person, place, and time. He exhibits normal muscle tone.  Skin: Skin is warm and dry.  Psychiatric: He has a normal mood and affect.   Lab Results:   Recent Labs  12/25/14 0807  WBC 6.6  HGB 14.9  HCT 46.3  PLT 141*   BMET  Recent Labs  12/25/14 0807 12/26/14 0410 12/27/14 0448  NA 142 142 141  K 3.8 4.3 4.2  CL 100* 100* 99*  CO2 35* 36* 35*  GLUCOSE 121* 148* 119*  BUN 23* 29* 35*  CREATININE 1.76* 1.67* 1.47*  CALCIUM 9.2 8.7* 9.1   PT/INR  Recent Labs  12/25/14 0807 12/26/14 0410 12/27/14 0448  LABPROT 29.5* 35.6*  37.2*  INR 2.85* 3.67* 3.88*    Assessment/Plan   Acute on chronic respiratory failure with hypoxia (HCC)  Acute on chronic systolic CHF (congestive heart failure) (HCC) Net Fluids -1.7 L/-2.3L.Marland Kitchen No JVD. On home lasix dose now.  SCr stable.    Chronic obstructive pulmonary disease with acute exacerbation (HCC)  Per IM.   Elevated troponin  Trending down from 0.08. CP free. Likely demand ischemia from [facility] and hypoxemia. O2 sats charted at 78% yesterday. It eased up after a breathing treatment/prednisone.No ischemic eval is needed.   NSVT 15 Beat run.   On coreg.  Has ICD.  No shock.    Biventricular implantable cardioverter-defibrillator in situ  Atrial fibrillation (HCC) AV pacing on telemetry. CHADSVASC 5 already on  Coumadin.   Chronic kidney disease, stage 3 SCr improved slightly with increased BUN.  Coronary artery disease involving native coronary artery of native heart  Dyspnea  BPH (benign prostatic hyperplasia)  FTT (failure to thrive) in adult      LOS: 2 days    Kassie Keng PA-C 12/27/2014 9:23 AM

## 2014-12-27 NOTE — Clinical Social Work Placement (Signed)
   CLINICAL SOCIAL WORK PLACEMENT  NOTE  Date:  12/27/2014  Patient Details  Name: Troy [name] MRN: [id number] Date of Birth: [birth year]  Clinical Social Work is seeking post-discharge placement for this patient at the [place] level of care (*CSW will initial, date and re-position this form in  chart as items are completed):  Yes   Patient/family provided with [place] Work Department's list of facilities offering this level of care within the geographic area requested by the patient (or if unable, by the patient's family).  Yes   Patient/family informed of their freedom to choose among providers that offer the needed level of care, that participate in Medicare, Medicaid or managed care program needed by the patient, have an available bed and are willing to accept the patient.  Yes   Patient/family informed of [facility]'s ownership interest in [facility] and [facility], as well as of the fact that they are under no obligation to receive care at these facilities.  PASRR submitted to EDS on       PASRR number received on       Existing PASRR number confirmed on 12/26/14     FL2 transmitted to all facilities in geographic area requested by pt/family on 12/26/14     FL2 transmitted to all facilities within larger geographic area on       Patient informed that his/her managed care company has contracts with or will negotiate with certain facilities, including the following:        Yes   Patient/family informed of bed offers received.  Patient chooses bed at [facility]     Physician recommends and patient chooses bed at      Patient to be transferred to [facility] on  .  Patient to be transferred to facility by       Patient family notified on   of transfer.  Name of family member notified:        PHYSICIAN Please sign FL2     Additional Comment:     _______________________________________________ Ladell Pier, LCSW 12/27/2014, 1:05 PM

## 2014-12-27 NOTE — Progress Notes (Signed)
ANTICOAGULATION CONSULT NOTE - Follow Up  Pharmacy Consult for warfarin Indication: atrial fibrillation  Allergies  Allergen Reactions  . Benadryl [Diphenhydramine Hcl] Nausea And Vomiting    Patient Measurements: Height: 5\' 5"  (165.1 cm) Weight: 156 lb 8 oz (70.988 kg) IBW/kg (Calculated) : 61.5  Vital Signs: Temp: 97.8 F (36.6 C) (12/06 0633) Temp Source: Oral (12/06 [id number]) BP: 123/52 mmHg (12/06 0633) Pulse Rate: 52 (12/06 0633)  Labs:  Recent Labs  12/25/14 0807 12/25/14 1641 12/25/14 2259 12/26/14 0410 12/27/14 0448  HGB 14.9  --   --   --   --   HCT 46.3  --   --   --   --   PLT 141*  --   --   --   --   LABPROT 29.5*  --   --  35.6* 37.2*  INR 2.85*  --   --  3.67* 3.88*  CREATININE 1.76*  --   --  1.67* 1.47*  TROPONINI  --  0.08* 0.07* 0.06*  --     Estimated Creatinine Clearance: 38.4 mL/min (by C-G formula based on Cr of 1.47).  Assessment: 42 YOM presents with shortness of breath and chest pain thought to be due to acute HF exacerbation and/or COPD. He is on chronic warfarin for afib. INR is therapeutic at time of admission.   Home warfarin dose 2mg  daily except 3mg  on Wed/Sat.  Today 12/27/2014   INR supratherapeutic = 3.88 and rising  CBC: Hgb WNL, pltc mildly low on 12/4  SCr returned to baseline of ~1.5  Started on ASA 81mg  (not on at home) and prednisone  Tolerating ~50% diet  Goal of Therapy:  INR 2-3 Monitor platelets by anticoagulation protocol: Yes   Plan:    Hold warfarin again today  Daily INR  Monitor for bleeding, CBC in AM  Peggyann Juba, PharmD, BCPS Pager: [phone]  12/27/2014 9:28 AM

## 2014-12-27 NOTE — Progress Notes (Signed)
Progress Note   Troy [name] [id number] DOB: [birth year] DOA: 12/25/2014 PCP: Barbette Merino, MD   Brief Narrative:   RAHMEEK [name] is an 74 y.o. male with a PMH of CAD, ischemic cardiomyopathy with chronic systolic CHF and EF of 123456 status post biventricular pacemaker, HTN, paroxysmal atrial fibrillation on coumadin, DM, and COPD, who was admitted 12/25/14 with acute on chronic systolic CHF after failing outpatient therapy with the addition of Aldactone by Dr. Lovena Le on 12/19/14. Cardiology following.  Assessment/Plan:   Principal Problem:  Acute on chronic respiratory failure with hypoxia ([place]), multifactorial/dyspnea - Appears to have decompensated CHF in addition to a mild COPD exacerbation. - Placed on supplemental oxygen. See problems below for specific interventions. - Respiratory status slowly improving.  Active Problems:  Acute on chronic systolic CHF (congestive heart failure) (HCC) status post biventricular implantable cardioverter-defibrillator in situ/coronary artery disease of native coronary artery without angina - Heart healthy diet with fluid restriction ordered. - Continue aspirin, Lipitor, Coreg, Lanoxin, hydralazine, Cozaar and resume home dose of Lasix. - I/O- 1.5 mL and weight down 6 pounds. - Last 2-D echo done 11/22/13. EF 20-25 percent. No need to repeat. - Troponins mildly elevated, likely secondary to demand ischemia.  No further invasive work up recommended by cards. - Being followed by cardiology, long acting nitrate added.   Paroxysmal Atrial fibrillation / NSVT - Continue Coumadin. Continue digoxin & Coreg. - Has pacer/defibrillator in situ.     BPH - Continue uroxatral.   Acute kidney injury in the setting of chronic kidney disease, stage 3 - Baseline creatinine 1.3-1.4.  - Current creatinine slightly improved but remains elevated over usual baseline values, likely from decreased cardiac output. - Renal function improved with  diuresis.   Chronic obstructive pulmonary disease with acute exacerbation (HCC) - Continue nebulized bronchodilator treatments every 6 hours.  - Continue Mucinex. Given one dose of Solu-Medrol in the ED. Continue prednisone.   Failure to thrive - Education officer, [employer] consulted for assistance with placement to an ALF.   DVT prophylaxis - On coumadin.   Family Communication/Anticipated D/C date and plan/Code Status   Family Communication: The patient has no family, his brother is deceased and he is estranged from his sisters. Disposition Plan: Needs ALF vs. SNF.  PT recommends SNF. SW consult placed. Anticipated D/C date:   1-2 days when CHF compensated. Code Status: Full code.   IV Access:    Peripheral IV   Procedures and diagnostic studies:   Dg Chest 2 View  12/25/2014  CLINICAL DATA:  Shortness of breath. EXAM: CHEST - 2 VIEW COMPARISON:  09/26/2014 FINDINGS: The heart size and mediastinal contours are within normal limits. Stable appearance of a biventricular pacing/ ICD device. Probable chronic pulmonary venous hypertension without overt edema. Mild atelectasis at the right lung base present. No airspace consolidation, pneumothorax or pleural effusions. The visualized skeletal structures are unremarkable. IMPRESSION: Stable chronic pulmonary venous hypertension. Right basilar atelectasis. Electronically Signed   By: Aletta Edouard M.D.   On: 12/25/2014 08:10     Medical Consultants:    Cardiology: Belva Crome, MD  Anti-Infectives:   Anti-infectives    None      Subjective:   Troy [name] says his breathing is slightly better today. Still feels weak.  No N/V.  Appetite fair.  Bowels moving.   Objective:    Filed Vitals:   12/26/14 1458 12/26/14 2123 12/26/14 2148 12/27/14 0633  BP: 126/47 105/89 125/46 123/52  Pulse: 69  65  52  Temp: 97.6 F (36.4 C) 97.8 F (36.6 C)  97.8 F (36.6 C)  TempSrc: Oral Oral  Oral  Resp: 18 18  18   Height:        Weight:    70.988 kg (156 lb 8 oz)  SpO2: 97% 97%  95%    Intake/Output Summary (Last 24 hours) at 12/27/14 0823 Last data filed at 12/27/14 0140  Gross per 24 hour  Intake    600 ml  Output   2075 ml  Net  -1475 ml   Filed Weights   12/25/14 1044 12/26/14 0430 12/27/14 0633  Weight: 73.8 kg (162 lb 11.2 oz) 73.8 kg (162 lb 11.2 oz) 70.988 kg (156 lb 8 oz)    Exam: Gen:  NAD Cardiovascular:  RRR, No M/R/G Respiratory:  Lungs markedly diminished Gastrointestinal:  Abdomen soft, NT/ND, + BS Extremities:  No C/E/C   Data Reviewed:    Labs: Basic Metabolic Panel:  Recent Labs Lab 12/25/14 0807 12/26/14 0410 12/27/14 0448  NA 142 142 141  K 3.8 4.3 4.2  CL 100* 100* 99*  CO2 35* 36* 35*  GLUCOSE 121* 148* 119*  BUN 23* 29* 35*  CREATININE 1.76* 1.67* 1.47*  CALCIUM 9.2 8.7* 9.1   GFR Estimated Creatinine Clearance: 38.4 mL/min (by C-G formula based on Cr of 1.47). Liver Function Tests:  Recent Labs Lab 12/25/14 0807  AST 19  ALT 16*  ALKPHOS 75  BILITOT 1.0  PROT 6.9  ALBUMIN 3.5   Coagulation profile  Recent Labs Lab 12/25/14 0807 12/26/14 0410 12/27/14 0448  INR 2.85* 3.67* 3.88*    CBC:  Recent Labs Lab 12/25/14 0807  WBC 6.6  NEUTROABS 3.7  HGB 14.9  HCT 46.3  MCV 104.5*  PLT 141*   Cardiac Enzymes:  Recent Labs Lab 12/25/14 1641 12/25/14 2259 12/26/14 0410  TROPONINI 0.08* 0.07* 0.06*   BNP (last 3 results)  Recent Labs  04/25/14 1205 05/02/14 1115  PROBNP 2433.0* 1499.0*    Microbiology No results found for this or any previous visit (from the past 240 hour(s)).   Medications:   . alfuzosin  10 mg Oral Q breakfast  . allopurinol  100 mg Oral Daily  . antiseptic oral rinse  7 mL Mouth Rinse BID  . aspirin EC  81 mg Oral Daily  . atorvastatin  10 mg Oral QHS  . carvedilol  12.5 mg Oral BID WC  . digoxin  125 mcg Oral Daily  . fluticasone  2 spray Each Nare QHS  . furosemide  60 mg Intravenous BID  .  guaiFENesin  600 mg Oral BID  . hydrALAZINE  25 mg Oral TID  . Influenza vac split quadrivalent PF  0.5 mL Intramuscular Tomorrow-1000  . ipratropium-albuterol  3 mL Nebulization TID  . loratadine  10 mg Oral Daily  . losartan  100 mg Oral Daily  . mometasone-formoterol  2 puff Inhalation BID  . pantoprazole  40 mg Oral Daily  . pneumococcal 23 valent vaccine  0.5 mL Intramuscular Tomorrow-1000  . potassium chloride SA  20 mEq Oral BID  . predniSONE  40 mg Oral QAC breakfast  . sodium chloride  3 mL Intravenous Q12H  . spironolactone  25 mg Oral Daily  . Warfarin - Pharmacist Dosing Inpatient   Does not apply q1800   Continuous Infusions:   Time spent: 25 minutes.    LOS: 2 days   Jeris Easterly  [facility] Pager [phone]. If unable to  reach me by pager, please call my cell phone at [phone].  *Please refer to [email or web address], password [email or web address] to get updated schedule on who will round on this patient, as hospitalists switch teams weekly. If 7PM-7AM, please contact night-coverage at [email or web address], password [email or web address] for any overnight needs.  12/27/2014, 8:23 AM

## 2014-12-28 DIAGNOSIS — N179 Acute kidney failure, unspecified: Secondary | ICD-10-CM

## 2014-12-28 DIAGNOSIS — R627 Adult failure to thrive: Secondary | ICD-10-CM

## 2014-12-28 DIAGNOSIS — I5023 Acute on chronic systolic (congestive) heart failure: Secondary | ICD-10-CM | POA: Diagnosis not present

## 2014-12-28 DIAGNOSIS — R0600 Dyspnea, unspecified: Secondary | ICD-10-CM

## 2014-12-28 DIAGNOSIS — N400 Enlarged prostate without lower urinary tract symptoms: Secondary | ICD-10-CM

## 2014-12-28 DIAGNOSIS — J441 Chronic obstructive pulmonary disease with (acute) exacerbation: Secondary | ICD-10-CM | POA: Insufficient documentation

## 2014-12-28 LAB — BASIC METABOLIC PANEL
Anion gap: 6 (ref 5–15)
BUN: 40 mg/dL — AB (ref 6–20)
CHLORIDE: 97 mmol/L — AB (ref 101–111)
CO2: 38 mmol/L — ABNORMAL HIGH (ref 22–32)
CREATININE: 1.48 mg/dL — AB (ref 0.61–1.24)
Calcium: 9.2 mg/dL (ref 8.9–10.3)
GFR calc Af Amer: 52 mL/min — ABNORMAL LOW (ref >60)
GFR, EST NON AFRICAN AMERICAN: 45 mL/min — AB (ref >60)
GLUCOSE: 123 mg/dL — AB (ref 65–99)
POTASSIUM: 4.4 mmol/L (ref 3.5–5.1)
SODIUM: 141 mmol/L (ref 135–145)

## 2014-12-28 LAB — CBC
HCT: 44.1 % (ref 39.0–52.0)
HEMOGLOBIN: 14.3 g/dL (ref 13.0–17.0)
MCH: 33.6 pg (ref 26.0–34.0)
MCHC: 32.4 g/dL (ref 30.0–36.0)
MCV: 103.5 fL — AB (ref 78.0–100.0)
Platelets: 134 10*3/uL — ABNORMAL LOW (ref 150–400)
RBC: 4.26 MIL/uL (ref 4.22–5.81)
RDW: 16.3 % — ABNORMAL HIGH (ref 11.5–15.5)
WBC: 15.3 10*3/uL — ABNORMAL HIGH (ref 4.0–10.5)

## 2014-12-28 LAB — PROTIME-INR
INR: 3.45 — ABNORMAL HIGH (ref 0.00–1.49)
Prothrombin Time: 34 seconds — ABNORMAL HIGH (ref 11.6–15.2)

## 2014-12-28 NOTE — Progress Notes (Signed)
Patient Profile: 74 y/o male with h/o chronic systolic CHF with EF of 123456, CAD, PAF on chronic anticoagulation therapy with Coumadin and COPD, admitted for acute CHF exacerbation.  Subjective:  No major complaints. Feels better. Breathing improved but still not 100%.    Objective: Vital signs in last 24 hours: Temp:  [97.5 F (36.4 C)-97.8 F (36.6 C)] 97.5 F (36.4 C) (12/07 0506) Pulse Rate:  [61-63] 61 (12/07 0823) Resp:  [19-20] 20 (12/07 0506) BP: (116-139)/(43-54) 129/54 mmHg (12/07 0823) SpO2:  [86 %-96 %] 94 % (12/07 0506) Weight:  [156 lb 8 oz (70.988 kg)] 156 lb 8 oz (70.988 kg) (12/07 0700) Last BM Date: 12/27/14  Intake/Output from previous day: 12/06 0701 - 12/07 0700 In: 42 [P.O.:580] Out: 1550 [Urine:1550] Intake/Output this shift:    Medications Current Facility-Administered Medications  Medication Dose Route Frequency Provider Last Rate Last Dose  . 0.9 %  sodium chloride infusion  250 mL Intravenous PRN Venetia Maxon Rama, MD      . acetaminophen (TYLENOL) tablet 650 mg  650 mg Oral Q4H PRN Venetia Maxon Rama, MD   650 mg at 12/27/14 2038  . alfuzosin (UROXATRAL) 24 hr tablet 10 mg  10 mg Oral Q breakfast Venetia Maxon Rama, MD   10 mg at 12/28/14 0823  . allopurinol (ZYLOPRIM) tablet 100 mg  100 mg Oral Daily Venetia Maxon Rama, MD   100 mg at 12/27/14 0901  . antiseptic oral rinse (CPC / CETYLPYRIDINIUM CHLORIDE 0.05%) solution 7 mL  7 mL Mouth Rinse BID Venetia Maxon Rama, MD   7 mL at 12/27/14 2216  . aspirin EC tablet 81 mg  81 mg Oral Daily Venetia Maxon Rama, MD   81 mg at 12/27/14 0902  . atorvastatin (LIPITOR) tablet 10 mg  10 mg Oral QHS Venetia Maxon Rama, MD   10 mg at 12/27/14 2216  . carvedilol (COREG) tablet 12.5 mg  12.5 mg Oral BID WC Venetia Maxon Rama, MD   12.5 mg at 12/28/14 0823  . digoxin (LANOXIN) tablet 125 mcg  125 mcg Oral Daily Venetia Maxon Rama, MD   125 mcg at 12/27/14 0901  . fluticasone (FLONASE) 50 MCG/ACT nasal spray 2 spray  2 spray  Each Nare QHS Venetia Maxon Rama, MD   2 spray at 12/27/14 2216  . furosemide (LASIX) tablet 80 mg  80 mg Oral Daily Venetia Maxon Rama, MD   80 mg at 12/27/14 0902  . furosemide (LASIX) tablet 80 mg  80 mg Oral QPM Belva Crome, MD   80 mg at 12/27/14 1734  . guaiFENesin (MUCINEX) 12 hr tablet 600 mg  600 mg Oral BID Venetia Maxon Rama, MD   600 mg at 12/27/14 2216  . guaiFENesin-dextromethorphan (ROBITUSSIN DM) 100-10 MG/5ML syrup 5 mL  5 mL Oral Q4H PRN Venetia Maxon Rama, MD   5 mL at 12/27/14 1318  . hydrALAZINE (APRESOLINE) tablet 25 mg  25 mg Oral TID Venetia Maxon Rama, MD   25 mg at 12/27/14 2216  . ipratropium-albuterol (DUONEB) 0.5-2.5 (3) MG/3ML nebulizer solution 3 mL  3 mL Nebulization TID Venetia Maxon Rama, MD   3 mL at 12/27/14 2108  . ipratropium-albuterol (DUONEB) 0.5-2.5 (3) MG/3ML nebulizer solution 3 mL  3 mL Nebulization Q6H PRN Christina P Rama, MD      . loratadine (CLARITIN) tablet 10 mg  10 mg Oral Daily Venetia Maxon Rama, MD   10 mg at 12/27/14 0901  . losartan (COZAAR) tablet  100 mg  100 mg Oral Daily Venetia Maxon Rama, MD   100 mg at 12/27/14 0901  . menthol-cetylpyridinium (CEPACOL) lozenge 3 mg  1 lozenge Oral PRN Gardiner Barefoot, NP      . mometasone-formoterol (DULERA) 100-5 MCG/ACT inhaler 2 puff  2 puff Inhalation BID Venetia Maxon Rama, MD   2 puff at 12/27/14 2109  . ondansetron (ZOFRAN) injection 4 mg  4 mg Intravenous Q6H PRN Christina P Rama, MD      . pantoprazole (PROTONIX) EC tablet 40 mg  40 mg Oral Daily Venetia Maxon Rama, MD   40 mg at 12/27/14 0903  . pneumococcal 23 valent vaccine (PNU-IMMUNE) injection 0.5 mL  0.5 mL Intramuscular Tomorrow-1000 Christina P Rama, MD      . potassium chloride SA (K-DUR,KLOR-CON) CR tablet 20 mEq  20 mEq Oral BID Venetia Maxon Rama, MD   20 mEq at 12/27/14 2216  . sodium chloride 0.9 % injection 3 mL  3 mL Intravenous Q12H Venetia Maxon Rama, MD   3 mL at 12/27/14 2216  . sodium chloride 0.9 % injection 3 mL  3 mL Intravenous PRN  Venetia Maxon Rama, MD      . spironolactone (ALDACTONE) tablet 25 mg  25 mg Oral Daily Venetia Maxon Rama, MD   25 mg at 12/27/14 0901  . Warfarin - Pharmacist Dosing Inpatient   Does not apply q1800 Berton Mount, RPH   0  at 12/25/14 1800    PE: General appearance: alert, cooperative and no distress Neck: no carotid bruit and no JVD Lungs: decreased BS bilaterally, rales at the LLL base that clear after cough Heart: regular rate and rhythm Extremities: no LEE Pulses: 2+ and symmetric Skin: warm and dry Neurologic: Grossly normal  Filed Weights   12/26/14 0430 12/27/14 0633 12/28/14 0700  Weight: 162 lb 11.2 oz (73.8 kg) 156 lb 8 oz (70.988 kg) 156 lb 8 oz (70.988 kg)   Lab Results:   Recent Labs  12/28/14 0500  WBC 15.3*  HGB 14.3  HCT 44.1  PLT 134*   BMET  Recent Labs  12/26/14 0410 12/27/14 0448 12/28/14 0500  NA 142 141 141  K 4.3 4.2 4.4  CL 100* 99* 97*  CO2 36* 35* 38*  GLUCOSE 148* 119* 123*  BUN 29* 35* 40*  CREATININE 1.67* 1.47* 1.48*  CALCIUM 8.7* 9.1 9.2   PT/INR  Recent Labs  12/26/14 0410 12/27/14 0448 12/28/14 0500  LABPROT 35.6* 37.2* 34.0*  INR 3.67* 3.88* 3.45*   Cardiac Panel (last 3 results)  Recent Labs  12/25/14 1641 12/25/14 2259 12/26/14 0410  TROPONINI 0.08* 0.07* 0.06*    Assessment/Plan  Principal Problem:   Acute on chronic respiratory failure with hypoxia (HCC) Active Problems:   Biventricular implantable cardioverter-defibrillator in situ   Atrial fibrillation (HCC)   Acute on chronic systolic CHF (congestive heart failure) (HCC)   Chronic kidney disease, stage 3   Coronary artery disease involving native coronary artery of native heart without angina pectoris   Chronic obstructive pulmonary disease with acute exacerbation (HCC)   Dyspnea   BPH (benign prostatic hyperplasia)   FTT (failure to thrive) in adult   AKI (acute kidney injury) ([place])   1. Acute on Chronic Systolic CHF: continues to gradually  improve. No edema. Weight is down from 162-156 lb. I/Os net negative 3.3 L since admit. Still on supplemental O2. Continue PO Lasix, 80 mg BID. Scr stable at 1.4. K WNL. Continue Coreg, losartan, hydralazine,  spironolactone and digoxin. Low sodium diet.   2. CAD: denies CP. Continue medical therapy.   3. PAF: SR. HR controlled. On Coumadin for a/c.     LOS: 3 days    Treven Holtman M. Ladoris Gene 12/28/2014 8:31 AM

## 2014-12-28 NOTE — Progress Notes (Addendum)
Pt ambulated 386 ft in hall.  Lind Guest, RN

## 2014-12-28 NOTE — Progress Notes (Signed)
ANTICOAGULATION CONSULT NOTE - Follow Up  Pharmacy Consult for warfarin Indication: atrial fibrillation  Allergies  Allergen Reactions  . Benadryl [Diphenhydramine Hcl] Nausea And Vomiting    Patient Measurements: Height: 5\' 5"  (165.1 cm) Weight: 156 lb 8 oz (70.988 kg) IBW/kg (Calculated) : 61.5  Vital Signs: Temp: 97.5 F (36.4 C) (12/07 0506) Temp Source: Oral (12/07 0506) BP: 129/54 mmHg (12/07 0823) Pulse Rate: 61 (12/07 0823)  Labs:  Recent Labs  12/25/14 1641 12/25/14 2259 12/26/14 0410 12/27/14 0448 12/28/14 0500  HGB  --   --   --   --  14.3  HCT  --   --   --   --  44.1  PLT  --   --   --   --  134*  LABPROT  --   --  35.6* 37.2* 34.0*  INR  --   --  3.67* 3.88* 3.45*  CREATININE  --   --  1.67* 1.47* 1.48*  TROPONINI 0.08* 0.07* 0.06*  --   --     Estimated Creatinine Clearance: 38.1 mL/min (by C-G formula based on Cr of 1.48).  Assessment: 66 YOM presents with shortness of breath and chest pain thought to be due to acute HF exacerbation and/or COPD. He is on chronic warfarin for afib. INR is therapeutic at time of admission.   Home warfarin dose 2mg  daily except 3mg  on Wed/Sat.  Today 12/28/2014   INR supratherapeutic = 3.45, improved from yesterday (no warfarin since 12/4)  CBC: Hgb WNL, pltc mildly low  SCr returned to baseline of ~1.5  Started on ASA 81mg  (not on at home) and prednisone  Tolerating regular diet  Goal of Therapy:  INR 2-3 Monitor platelets by anticoagulation protocol: Yes   Plan:    Hold warfarin again today  Daily INR  Monitor for bleeding  Peggyann Juba, PharmD, BCPS Pager: [phone]  12/28/2014 9:45 AM

## 2014-12-28 NOTE — Progress Notes (Signed)
Physical Therapy Treatment Patient Details Name: SEVIN PACHA MRN: [id number] DOB: [birth year] Today's Date: 12/28/2014    History of Present Illness Patient 74 year old male with history of coronary artery disease,  ischemic cardiomyopathy, hypertension, DVT, diabetes mellitus, atrial fibrillation, chronic systolic heart failure, COPD, ejection fraction was 20-25% with diffuse hypokinesis.  He has a Best boy.  He was admitted 12/25/14 with increased SOB, chest tightness.    PT Comments    Progressing with mobility. O2 sats 92% on RA at rest, 85% on RA during ambulation. Continue to recommend SNF to maximize independence and safety before returning home.  Follow Up Recommendations  SNF     Equipment Recommendations  Rolling walker with 5" wheels    Recommendations for Other Services       Precautions / Restrictions Precautions Precautions: Fall Precaution Comments: monitor sats    Mobility  Bed Mobility               General bed mobility comments: oob in recliner  Transfers Overall transfer level: Needs assistance Equipment used: Rolling walker (2 wheeled) Transfers: Sit to/from Stand Sit to Stand: Min guard         General transfer comment: close guard for safety. VCs safety.  Ambulation/Gait Ambulation/Gait assistance: Min guard Ambulation Distance (Feet): 125 Feet Assistive device: Rolling walker (2 wheeled) Gait Pattern/deviations: Step-through pattern;Decreased stride length     General Gait Details: close guard for safety. 85% on RA during ambulation.    Stairs            Wheelchair Mobility    Modified Rankin (Stroke Patients Only)       Balance                                    Cognition Arousal/Alertness: Awake/alert Behavior During Therapy: WFL for tasks assessed/performed Overall Cognitive Status: Within Functional Limits for tasks assessed                      Exercises      General  Comments        Pertinent Vitals/Pain Pain Assessment: No/denies pain    Home Living                      Prior Function            PT Goals (current goals can now be found in the care plan section) Progress towards PT goals: Progressing toward goals    Frequency  Min 3X/week    PT Plan Current plan remains appropriate    Co-evaluation             End of Session   Activity Tolerance: Patient tolerated treatment well Patient left: in chair;with call bell/phone within reach;with chair alarm set     Time: [id number] PT Time Calculation (min) (ACUTE ONLY): 14 min  Charges:  $Gait Training: 8-22 mins                    G Codes:      Weston Anna, MPT Pager: [phone]

## 2014-12-28 NOTE — Progress Notes (Signed)
Progress Note   Troy [name] [id number] DOB: 01/29/1940 DOA: 12/25/2014 PCP: Barbette Merino, MD   Subjective:   Seen sitting at bedside, denies shortness of breath but she still on 2 L of oxygen. Patient never been on oxygen at home, doing well on oral Lasix. Likely to discharge to SNF in a.m. if okay with cardiology.  Brief Narrative:   Troy [name] is an 74 y.o. male with a PMH of CAD, ischemic cardiomyopathy with chronic systolic CHF and EF of 123456 status post biventricular pacemaker, HTN, paroxysmal atrial fibrillation on coumadin, DM, and COPD, who was admitted 12/25/14 with acute on chronic systolic CHF after failing outpatient therapy with the addition of Aldactone by Dr. Lovena Le on 12/19/14. Cardiology following.  Assessment/Plan:    Acute on chronic respiratory failure with hypoxia ([place]), multifactorial/dyspnea - Appears to have decompensated CHF in addition to a mild COPD exacerbation. - Placed on supplemental oxygen. See problems below for specific interventions. - Respiratory status slowly improving.   Acute on chronic systolic CHF (congestive heart failure) (HCC) status post biventricular implantable cardioverter-defibrillator in situ/coronary artery disease of native coronary artery without angina - Heart healthy diet with fluid restriction ordered. - Continue aspirin, Lipitor, Coreg, Lanoxin, hydralazine, Cozaar and resume home dose of Lasix. - I/O- 1.5 mL and weight down 6 pounds. - Last 2-D echo done 11/22/13. EF 20-25 percent. No need to repeat. - Troponins mildly elevated, likely secondary to demand ischemia.  No further invasive work up recommended by cards. - Being followed by cardiology, long acting nitrate added. Continue current regimen.   Paroxysmal Atrial fibrillation / NSVT - Continue Coumadin. Continue digoxin & Coreg. - Has pacer/defibrillator in situ.   - This patients CHA2DS2-VASc Score and unadjusted Ischemic Stroke Rate (% per year) is  equal to 7.2 % stroke rate/year from a score of 5 for HTN, CAD, CHF, DM and age of 35. Above score calculated as 1 point each if present [CHF, HTN, DM, Vascular=MI/PAD/Aortic Plaque, Age if 53-74, or Male] Above score calculated as 2 points each if present [Age > 75, or Stroke/TIA/TE]   BPH - Continue uroxatral.   Acute kidney injury in the setting of chronic kidney disease, stage 3 - Baseline creatinine 1.3-1.4.  - Current creatinine slightly improved but remains elevated over usual baseline values, likely from decreased cardiac output. - Renal function improved with diuresis.   Chronic obstructive pulmonary disease with acute exacerbation (HCC) - Continue nebulized bronchodilator treatments every 6 hours.  - Continue Mucinex. Given one dose of Solu-Medrol in the ED. taper the prednisone down.   Failure to thrive - Education officer, museum consulted for assistance with placement to an ALF.   DVT prophylaxis - On coumadin.   Family Communication/Anticipated D/C date and plan/Code Status   Family Communication: The patient has no family, his brother is deceased and he is estranged from his sisters. Disposition Plan: Needs ALF vs. SNF.  PT recommends SNF. SW consult placed. Anticipated D/C date:   1-2 days when CHF compensated. Code Status: Full code.   IV Access:    Peripheral IV   Procedures and diagnostic studies:   Dg Chest 2 View  12/25/2014  CLINICAL DATA:  Shortness of breath. EXAM: CHEST - 2 VIEW COMPARISON:  09/26/2014 FINDINGS: The heart size and mediastinal contours are within normal limits. Stable appearance of a biventricular pacing/ ICD device. Probable chronic pulmonary venous hypertension without overt edema. Mild atelectasis at the right lung base present. No airspace consolidation, pneumothorax or  pleural effusions. The visualized skeletal structures are unremarkable. IMPRESSION: Stable chronic pulmonary venous hypertension. Right basilar atelectasis.  Electronically Signed   By: Aletta Edouard M.D.   On: 12/25/2014 08:10     Medical Consultants:    Cardiology: Belva Crome, MD  Anti-Infectives:   Anti-infectives    None        Objective:    Filed Vitals:   12/28/14 0700 12/28/14 0823 12/28/14 0906 12/28/14 1055  BP:  129/54  122/49  Pulse:  61  63  Temp:      TempSrc:      Resp:      Height:      Weight: 70.988 kg (156 lb 8 oz)     SpO2:   90%     Intake/Output Summary (Last 24 hours) at 12/28/14 1404 Last data filed at 12/28/14 1347  Gross per 24 hour  Intake    340 ml  Output   1925 ml  Net  -1585 ml   Filed Weights   12/26/14 0430 12/27/14 0633 12/28/14 0700  Weight: 73.8 kg (162 lb 11.2 oz) 70.988 kg (156 lb 8 oz) 70.988 kg (156 lb 8 oz)    Exam: Gen:  NAD Cardiovascular:  RRR, No M/R/G Respiratory:  Lungs markedly diminished Gastrointestinal:  Abdomen soft, NT/ND, + BS Extremities:  No C/E/C   Data Reviewed:    Labs: Basic Metabolic Panel:  Recent Labs Lab 12/25/14 0807 12/26/14 0410 12/27/14 0448 12/28/14 0500  NA 142 142 141 141  K 3.8 4.3 4.2 4.4  CL 100* 100* 99* 97*  CO2 35* 36* 35* 38*  GLUCOSE 121* 148* 119* 123*  BUN 23* 29* 35* 40*  CREATININE 1.76* 1.67* 1.47* 1.48*  CALCIUM 9.2 8.7* 9.1 9.2   GFR Estimated Creatinine Clearance: 38.1 mL/min (by C-G formula based on Cr of 1.48). Liver Function Tests:  Recent Labs Lab 12/25/14 0807  AST 19  ALT 16*  ALKPHOS 75  BILITOT 1.0  PROT 6.9  ALBUMIN 3.5   Coagulation profile  Recent Labs Lab 12/25/14 0807 12/26/14 0410 12/27/14 0448 12/28/14 0500  INR 2.85* 3.67* 3.88* 3.45*    CBC:  Recent Labs Lab 12/25/14 0807 12/28/14 0500  WBC 6.6 15.3*  NEUTROABS 3.7  --   HGB 14.9 14.3  HCT 46.3 44.1  MCV 104.5* 103.5*  PLT 141* 134*   Cardiac Enzymes:  Recent Labs Lab 12/25/14 1641 12/25/14 2259 12/26/14 0410  TROPONINI 0.08* 0.07* 0.06*   BNP (last 3 results)  Recent Labs  04/25/14 1205  05/02/14 1115  PROBNP 2433.0* 1499.0*    Microbiology No results found for this or any previous visit (from the past 240 hour(s)).   Medications:   . alfuzosin  10 mg Oral Q breakfast  . allopurinol  100 mg Oral Daily  . antiseptic oral rinse  7 mL Mouth Rinse BID  . aspirin EC  81 mg Oral Daily  . atorvastatin  10 mg Oral QHS  . carvedilol  12.5 mg Oral BID WC  . digoxin  125 mcg Oral Daily  . fluticasone  2 spray Each Nare QHS  . furosemide  80 mg Oral Daily  . furosemide  80 mg Oral QPM  . guaiFENesin  600 mg Oral BID  . hydrALAZINE  25 mg Oral TID  . ipratropium-albuterol  3 mL Nebulization TID  . loratadine  10 mg Oral Daily  . losartan  100 mg Oral Daily  . mometasone-formoterol  2 puff Inhalation BID  .  pantoprazole  40 mg Oral Daily  . potassium chloride SA  20 mEq Oral BID  . sodium chloride  3 mL Intravenous Q12H  . spironolactone  25 mg Oral Daily  . Warfarin - Pharmacist Dosing Inpatient   Does not apply q1800   Continuous Infusions:   Time spent: 25 minutes.    LOS: 3 days   [facility] Pager [phone]. If unable to reach me by pager, please call my cell phone at [phone].  *Please refer to [email or web address], password [email or web address] to get updated schedule on who will round on this patient, as hospitalists switch teams weekly. If 7PM-7AM, please contact night-coverage at [email or web address], password [email or web address] for any overnight needs.  12/28/2014, 2:04 PM

## 2014-12-28 NOTE — Care Management Important Message (Signed)
Important Message  Patient Details  Name: Troy [name] MRN: [id number] Date of Birth: 09-Nov-1940   Medicare Important Message Given:  Yes    Camillo Flaming 12/28/2014, 12:48 [place] Message  Patient Details  Name: Troy [name] MRN: [id number] Date of Birth: [birth year]   Medicare Important Message Given:  Yes    Camillo Flaming 12/28/2014, 12:48 PM

## 2014-12-28 NOTE — Progress Notes (Signed)
CSW continuing to follow.   Pt plans to go to [facility] upon discharge.  Per MD, pt not yet medically ready for discharge today, but anticipate d/c tomorrow.  CSW updated [facility] and facility confirmed that facility received [facility] authorization.   CSW to facilitate pt discharge to [facility] when pt medically ready for discharge.  Alison Murray, MSW, [place] Work [phone]

## 2014-12-29 ENCOUNTER — Inpatient Hospital Stay
Admission: RE | Admit: 2014-12-29 | Discharge: 2015-01-21 | Disposition: A | Discharge status: Home / Self Care | Payer: Medicare HMO | Source: Ambulatory Visit | Attending: Internal Medicine | Admitting: Internal Medicine

## 2014-12-29 DIAGNOSIS — I5043 Acute on chronic combined systolic (congestive) and diastolic (congestive) heart failure: Secondary | ICD-10-CM

## 2014-12-29 DIAGNOSIS — J90 Pleural effusion, not elsewhere classified: Principal | ICD-10-CM

## 2014-12-29 LAB — PROTIME-INR
INR: 2.74 — ABNORMAL HIGH (ref 0.00–1.49)
Prothrombin Time: 28.6 seconds — ABNORMAL HIGH (ref 11.6–15.2)

## 2014-12-29 LAB — BASIC METABOLIC PANEL
ANION GAP: 7 (ref 5–15)
BUN: 39 mg/dL — AB (ref 6–20)
CALCIUM: 8.9 mg/dL (ref 8.9–10.3)
CO2: 38 mmol/L — ABNORMAL HIGH (ref 22–32)
Chloride: 95 mmol/L — ABNORMAL LOW (ref 101–111)
Creatinine, Ser: 1.42 mg/dL — ABNORMAL HIGH (ref 0.61–1.24)
GFR calc Af Amer: 55 mL/min — ABNORMAL LOW (ref >60)
GFR, EST NON AFRICAN AMERICAN: 47 mL/min — AB (ref >60)
Glucose, Bld: 95 mg/dL (ref 65–99)
POTASSIUM: 4.5 mmol/L (ref 3.5–5.1)
SODIUM: 140 mmol/L (ref 135–145)

## 2014-12-29 LAB — MAGNESIUM: Magnesium: 2 mg/dL (ref 1.7–2.4)

## 2014-12-29 MED ORDER — WARFARIN SODIUM 1 MG PO TABS
1.0000 mg | ORAL_TABLET | Freq: Once | ORAL | Status: DC
  Filled 2014-12-29: qty 1

## 2014-12-29 MED ORDER — FUROSEMIDE 80 MG PO TABS: 80.0000 mg | ORAL_TABLET | Freq: Two times a day (BID) | ORAL | Status: DC

## 2014-12-29 NOTE — Progress Notes (Signed)
ANTICOAGULATION CONSULT NOTE - Follow Up  Pharmacy Consult for warfarin Indication: atrial fibrillation  Allergies  Allergen Reactions  . Benadryl [Diphenhydramine Hcl] Nausea And Vomiting    Patient Measurements: Height: 5\' 5"  (165.1 cm) Weight: 157 lb 6.4 oz (71.396 kg) IBW/kg (Calculated) : 61.5  Vital Signs: Temp: 97.7 F (36.5 C) (12/08 0548) Temp Source: Oral (12/08 0548) BP: 133/54 mmHg (12/08 0548) Pulse Rate: 61 (12/08 0548)  Labs:  Recent Labs  12/27/14 0448 12/28/14 0500 12/29/14 0436  HGB  --  14.3  --   HCT  --  44.1  --   PLT  --  134*  --   LABPROT 37.2* 34.0* 28.6*  INR 3.88* 3.45* 2.74*  CREATININE 1.47* 1.48*  --     Estimated Creatinine Clearance: 38.1 mL/min (by C-G formula based on Cr of 1.48).  Assessment: 47 YOM presents with shortness of breath and chest pain thought to be due to acute HF exacerbation and/or COPD. He is on chronic warfarin for afib. INR is therapeutic at time of admission.   Home warfarin dose 2mg  daily except 3mg  on Wed/Sat.  Today 12/29/2014   INR therapeutic =2.74,(no warfarin since 12/4)  CBC: Hgb WNL, pltc mildly low  SCr returned to baseline of ~1.5  Started on ASA 81mg  (not on at home)   Tolerating regular diet  Goal of Therapy:  INR 2-3 Monitor platelets by anticoagulation protocol: Yes   Plan:    Warfarin 1mg  po x1 at 1800  Daily INR  Monitor for bleeding  Dolly Rias RPh 12/29/2014, 8:31 AM Pager [phone]

## 2014-12-29 NOTE — Progress Notes (Signed)
Patient Profile: 74 y/o male with h/o chronic systolic CHF with EF of 123456, CAD, PAF on chronic anticoagulation therapy with Coumadin and COPD, admitted for acute CHF exacerbation.  Subjective: Feels better today. Currently off of supplemental O2 while resting. No dyspnea currently. He had a 4 beat run of NSVT earlier this am but was completely asymptomatic.   Objective: Vital signs in last 24 hours: Temp:  [97.6 F (36.4 C)-97.7 F (36.5 C)] 97.7 F (36.5 C) (12/08 0548) Pulse Rate:  [61-67] 61 (12/08 0548) Resp:  [18] 18 (12/08 0548) BP: (102-133)/(49-55) 133/54 mmHg (12/08 0548) SpO2:  [90 %-98 %] 95 % (12/08 0548) Weight:  [157 lb 6.4 oz (71.396 kg)] 157 lb 6.4 oz (71.396 kg) (12/08 0548) Last BM Date: 12/28/14  Intake/Output from previous day: 12/07 0701 - 12/08 0700 In: 240 [P.O.:240] Out: 1925 [Urine:1925] Intake/Output this shift:    Medications Current Facility-Administered Medications  Medication Dose Route Frequency Provider Last Rate Last Dose  . 0.9 %  sodium chloride infusion  250 mL Intravenous PRN Venetia Maxon Rama, MD      . acetaminophen (TYLENOL) tablet 650 mg  650 mg Oral Q4H PRN Venetia Maxon Rama, MD   650 mg at 12/27/14 2038  . alfuzosin (UROXATRAL) 24 hr tablet 10 mg  10 mg Oral Q breakfast Venetia Maxon Rama, MD   10 mg at 12/28/14 0823  . allopurinol (ZYLOPRIM) tablet 100 mg  100 mg Oral Daily Venetia Maxon Rama, MD   100 mg at 12/28/14 1058  . antiseptic oral rinse (CPC / CETYLPYRIDINIUM CHLORIDE 0.05%) solution 7 mL  7 mL Mouth Rinse BID Venetia Maxon Rama, MD   7 mL at 12/28/14 2200  . aspirin EC tablet 81 mg  81 mg Oral Daily Venetia Maxon Rama, MD   81 mg at 12/28/14 1058  . atorvastatin (LIPITOR) tablet 10 mg  10 mg Oral QHS Venetia Maxon Rama, MD   10 mg at 12/28/14 2101  . carvedilol (COREG) tablet 12.5 mg  12.5 mg Oral BID WC Venetia Maxon Rama, MD   12.5 mg at 12/28/14 1704  . digoxin (LANOXIN) tablet 125 mcg  125 mcg Oral Daily Venetia Maxon Rama, MD    125 mcg at 12/28/14 1058  . fluticasone (FLONASE) 50 MCG/ACT nasal spray 2 spray  2 spray Each Nare QHS Venetia Maxon Rama, MD   2 spray at 12/28/14 2100  . furosemide (LASIX) tablet 80 mg  80 mg Oral Daily Venetia Maxon Rama, MD   80 mg at 12/28/14 1057  . furosemide (LASIX) tablet 80 mg  80 mg Oral QPM Belva Crome, MD   80 mg at 12/28/14 1705  . guaiFENesin (MUCINEX) 12 hr tablet 600 mg  600 mg Oral BID Venetia Maxon Rama, MD   600 mg at 12/28/14 2101  . guaiFENesin-dextromethorphan (ROBITUSSIN DM) 100-10 MG/5ML syrup 5 mL  5 mL Oral Q4H PRN Venetia Maxon Rama, MD   5 mL at 12/28/14 2059  . hydrALAZINE (APRESOLINE) tablet 25 mg  25 mg Oral TID Venetia Maxon Rama, MD   25 mg at 12/28/14 2101  . ipratropium-albuterol (DUONEB) 0.5-2.5 (3) MG/3ML nebulizer solution 3 mL  3 mL Nebulization TID Venetia Maxon Rama, MD   3 mL at 12/28/14 2023  . ipratropium-albuterol (DUONEB) 0.5-2.5 (3) MG/3ML nebulizer solution 3 mL  3 mL Nebulization Q6H PRN Christina P Rama, MD      . loratadine (CLARITIN) tablet 10 mg  10 mg Oral Daily Christina  P Rama, MD   10 mg at 12/28/14 1056  . losartan (COZAAR) tablet 100 mg  100 mg Oral Daily Venetia Maxon Rama, MD   100 mg at 12/28/14 1056  . menthol-cetylpyridinium (CEPACOL) lozenge 3 mg  1 lozenge Oral PRN Gardiner Barefoot, NP      . mometasone-formoterol (DULERA) 100-5 MCG/ACT inhaler 2 puff  2 puff Inhalation BID Venetia Maxon Rama, MD   2 puff at 12/28/14 2023  . ondansetron (ZOFRAN) injection 4 mg  4 mg Intravenous Q6H PRN Christina P Rama, MD      . pantoprazole (PROTONIX) EC tablet 40 mg  40 mg Oral Daily Venetia Maxon Rama, MD   40 mg at 12/28/14 1057  . potassium chloride SA (K-DUR,KLOR-CON) CR tablet 20 mEq  20 mEq Oral BID Venetia Maxon Rama, MD   20 mEq at 12/28/14 2101  . sodium chloride 0.9 % injection 3 mL  3 mL Intravenous Q12H Venetia Maxon Rama, MD   3 mL at 12/28/14 2059  . sodium chloride 0.9 % injection 3 mL  3 mL Intravenous PRN Venetia Maxon Rama, MD      .  spironolactone (ALDACTONE) tablet 25 mg  25 mg Oral Daily Venetia Maxon Rama, MD   25 mg at 12/28/14 1057  . Warfarin - Pharmacist Dosing Inpatient   Does not apply q1800 Berton Mount, RPH   0  at 12/25/14 1800    PE: General appearance: alert, cooperative and no distress Neck: no carotid bruit and no JVD Lungs: decreased BS bilterally, faint diffuse expiratory wheezing bilaterally, no rhonchi or rales Heart: regular rate and rhythm Extremities: no LEE Pulses: 2+ and symmetric Skin: warm and dry Neurologic: Grossly normal  Lab Results:   Recent Labs  12/28/14 0500  WBC 15.3*  HGB 14.3  HCT 44.1  PLT 134*   BMET  Recent Labs  12/27/14 0448 12/28/14 0500  NA 141 141  K 4.2 4.4  CL 99* 97*  CO2 35* 38*  GLUCOSE 119* 123*  BUN 35* 40*  CREATININE 1.47* 1.48*  CALCIUM 9.1 9.2   PT/INR  Recent Labs  12/27/14 0448 12/28/14 0500 12/29/14 0436  LABPROT 37.2* 34.0* 28.6*  INR 3.88* 3.45* 2.74*     Assessment/Plan  Principal Problem:   Acute on chronic respiratory failure with hypoxia (HCC) Active Problems:   Biventricular implantable cardioverter-defibrillator in situ   Atrial fibrillation (HCC)   Acute on chronic systolic CHF (congestive heart failure) (HCC)   Chronic kidney disease, stage 3   Coronary artery disease involving native coronary artery of native heart without angina pectoris   Chronic obstructive pulmonary disease with acute exacerbation (HCC)   Dyspnea   BPH (benign prostatic hyperplasia)   FTT (failure to thrive) in adult   AKI (acute kidney injury) ([place])   COPD exacerbation ([place])   1. Acute on Chronic Systolic CHF: continues to gradually improve. No edema. euvolemic. Weight is down from 162-157 lb. I/Os net negative 4.9 L since admit. Currently off of supplemental O2 while at rest. Still with mild diffuse expiratory wheezing. Suspect also significant pulmonary component contributing to his dyspnea. Continue PO Lasix. Discussed with Dr.  Tamala Julian. Renal function has stablized with SCr ~1.40. Ok to continue 80 of Lasix BID. Continue Coreg, losartan, hydralazine, spironolactone and digoxin. Low sodium diet.   2. CAD: denies CP. Continue medical therapy.   3. PAF: SR. HR controlled. On Coumadin for a/c.   4. Renal Insufficiency: SCr stable in the 1.4 range.  Continue 80 mg of lasix BID.   5. NSVT: 4-5 beat run captured on telemetry earlier today. Patient was asymptomatic. This is in the setting of low EF. He has an ICD. Continue BB therapy with Coreg.    Stable from a cardiac standpoint. We will arrange f/u in our office.     LOS: 4 days    Brittainy M. Ladoris Gene 12/29/2014 7:49 AM

## 2014-12-29 NOTE — Progress Notes (Signed)
Report called to [facility] @ [facility].  Lind Guest, RN

## 2014-12-29 NOTE — Progress Notes (Signed)
Pt for discharge to [facility].   CSW facilitated pt discharge needs including contacting facility, faxing pt discharge information via [place], discussing with pt at bedside, providing pt some low income housing information in which pt requested and discussed with pt that [facility] social worker can continue to assist pt at facility, providing RN phone number to call report, confirming [place] has [employer] authorization, and arranging ambulance transport for pt to [facility].  Pt coping appropriately with transition to [facility] and eager to begin feeling better.  No further social work needs identified at this time.  CSW signing off.  Alison Murray, MSW, [place] Work [phone]

## 2014-12-29 NOTE — Clinical Social Work Placement (Signed)
   CLINICAL SOCIAL WORK PLACEMENT  NOTE  Date:  12/29/2014  Patient Details  Name: Troy [name] MRN: [id number] Date of Birth: 10-10-1940  Clinical Social Work is seeking post-discharge placement for this patient at the [place] level of care (*CSW will initial, date and re-position this form in  chart as items are completed):  Yes   Patient/family provided with [place] Work Department's list of facilities offering this level of care within the geographic area requested by the patient (or if unable, by the patient's family).  Yes   Patient/family informed of their freedom to choose among providers that offer the needed level of care, that participate in Medicare, Medicaid or managed care program needed by the patient, have an available bed and are willing to accept the patient.  Yes   Patient/family informed of 's ownership interest in [facility] and [facility], as well as of the fact that they are under no obligation to receive care at these facilities.  PASRR submitted to EDS on       PASRR number received on       Existing PASRR number confirmed on 12/26/14     FL2 transmitted to all facilities in geographic area requested by pt/family on 12/26/14     FL2 transmitted to all facilities within larger geographic area on       Patient informed that his/her managed care company has contracts with or will negotiate with certain facilities, including the following:        Yes   Patient/family informed of bed offers received.  Patient chooses bed at [facility]     Physician recommends and patient chooses bed at      Patient to be transferred to [facility] on 12/29/14.  Patient to be transferred to facility by ambulance [facility])     Patient family notified on 12/29/14 of transfer.  Name of family member notified:  pt notified at bedside     PHYSICIAN Please sign FL2     Additional Comment:     _______________________________________________ Ladell Pier, LCSW 12/29/2014, 1:47 PM

## 2014-12-29 NOTE — Discharge Summary (Signed)
Physician Discharge Summary  Troy [name] [id number] DOB: 1940/02/27 DOA: 12/25/2014  PCP: Barbette Merino, MD  Admit date: 12/25/2014 Discharge date: 12/29/2014  Time spent: 40 minutes  Recommendations for Outpatient Follow-up:  1. Follow up with nursing home M.D. discharged to [facility]. 2. Follow-up with cardiology, office will call you. 3. Lasix changed to 80 mg twice a day.  4. Check BMP/CBC 1 week.   Discharge Diagnoses:  Principal Problem:   Acute on chronic respiratory failure with hypoxia (HCC) Active Problems:   Biventricular implantable cardioverter-defibrillator in situ   Atrial fibrillation (HCC)   Acute on chronic systolic CHF (congestive heart failure) (HCC)   Chronic kidney disease, stage 3   Coronary artery disease involving native coronary artery of native heart without angina pectoris   Chronic obstructive pulmonary disease with acute exacerbation (HCC)   Dyspnea   BPH (benign prostatic hyperplasia)   FTT (failure to thrive) in adult   AKI (acute kidney injury) ([place])   COPD exacerbation ([place])   Discharge Condition: Stable  Diet recommendation: Heart healthy  Filed Weights   12/27/14 0633 12/28/14 0700 12/29/14 0548  Weight: 70.988 kg (156 lb 8 oz) 70.988 kg (156 lb 8 oz) 71.396 kg (157 lb 6.4 oz)    History of present illness:  Troy [name] is an 74 y.o. male with a PMH of CAD, ischemic cardiomyopathy with chronic systolic CHF and EF of 123456 status post biventricular pacemaker, HTN, paroxysmal atrial fibrillation on coumadin, DM, and COPD, who presented to the ED today with chief complaint of a few week history of gradually worsening shortness of breath. Shortness of breath is worse at night and worse with exertion, although he says he has been short of breath for at least one year. He also endorses intermittent chest tightness but no frank chest pains. He saw Dr. Lovena Le of cardiology 12/19/14 and Aldactone was added to his usual dose  of Lasix, but has had ongoing symptoms despite this medication change. Patient has some nonproductive cough as well. Denies any fevers or abdominal pain. States that he has minimal leg swelling that is not getting worse. It looks that on a previous admission, home oxygen was recommended but he denies that he is on any home oxygen. Denies excessive salt intake. He lives alone and ambulates with a cane at baseline. He does not have any family or any help at home.  Hospital Course:   Acute on chronic respiratory failure with hypoxia ([place]), multifactorial/dyspnea - Appears to have decompensated CHF in addition to a mild COPD exacerbation. - Placed on supplemental oxygen. See problems below for specific interventions. - Respiratory status improved.  Acute on chronic systolic CHF (congestive heart failure) (HCC) status post biventricular implantable cardioverter-defibrillator in situ/coronary artery disease of native coronary artery without angina - Heart healthy diet with fluid restriction ordered. - Continue aspirin, Lipitor, Coreg, Lanoxin, hydralazine, Cozaar and resume home dose of Lasix. - I/O- 1.5 mL and weight down 6 pounds. - Last 2-D echo done 11/22/13. EF 20-25 percent. No need to repeat. - Troponins mildly elevated, likely secondary to demand ischemia. No further invasive work up recommended by cards. - Cardiology adjusted his regimen, Lasix increased to 80 mg twice a day. - Follow-up with cardiology as outpatient, need CBC/BMP within 1 week.  Paroxysmal Atrial fibrillation / NSVT - Continue Coumadin. Continue digoxin & Coreg. - Has pacer/defibrillator in situ.  - This patients CHA2DS2-VASc Score and unadjusted Ischemic Stroke Rate (% per year) is equal to 7.2 %  stroke rate/year from a score of 5 for HTN, CAD, CHF, DM and age of 22. Above score calculated as 1 point each if present [CHF, HTN, DM, Vascular=MI/PAD/Aortic Plaque, Age if 52-74, or Male] Above score calculated as 2 points  each if present [Age > 75, or Stroke/TIA/TE]  BPH - Continue uroxatral.  Acute kidney injury in the setting of chronic kidney disease, stage 3 - Baseline creatinine 1.3-1.4.  - Current creatinine slightly improved but remains elevated over usual baseline values, likely from decreased cardiac output. - Renal function improved with diuresis. Discharge creatinine is 1.42.  Chronic obstructive pulmonary disease with acute exacerbation (HCC) - Continue nebulized bronchodilator treatments every 6 hours.  - Continue Mucinex. Given one dose of Solu-Medrol in the ED. no prednisone taper on discharge.  Failure to thrive - Evaluated by PT/OT and recommended SNF.   Procedures:  None  Consultations:  Cardiology  Discharge Exam: Filed Vitals:   12/28/14 2135 12/29/14 0548  BP: 102/55 133/54  Pulse: 67 61  Temp: 97.6 F (36.4 C) 97.7 F (36.5 C)  Resp: 18 18   General: Alert and awake, oriented x3, not in any acute distress. HEENT: anicteric sclera, pupils reactive to light and accommodation, EOMI CVS: S1-S2 clear, no murmur rubs or gallops Chest: clear to auscultation bilaterally, no wheezing, rales or rhonchi Abdomen: soft nontender, nondistended, normal bowel sounds, no organomegaly Extremities: no cyanosis, clubbing or edema noted bilaterally Neuro: Cranial nerves II-XII intact, no focal neurological deficits  Discharge Instructions   Discharge Instructions    Diet - low sodium heart healthy    Complete by:  As directed      Increase activity slowly    Complete by:  As directed           Current Discharge Medication List    CONTINUE these medications which have CHANGED   Details  furosemide (LASIX) 80 MG tablet Take 1 tablet (80 mg total) by mouth 2 (two) times daily.      CONTINUE these medications which have NOT CHANGED   Details  acetaminophen (TYLENOL) 500 MG tablet Take 500 mg by mouth every 6 (six) hours as needed for mild pain or moderate pain.      albuterol (ACCUNEB) 1.25 MG/3ML nebulizer solution INHALE ONE VIAL VIA NEBULIZER EVERY 6 HOURS AS NEEDED FOR WHEEZING. Qty: 75 mL, Refills: 5    alfuzosin (UROXATRAL) 10 MG 24 hr tablet Take 10 mg by mouth daily with breakfast.     allopurinol (ZYLOPRIM) 100 MG tablet Take 100 mg by mouth daily.    atorvastatin (LIPITOR) 10 MG tablet Take 1 tablet (10 mg total) by mouth at bedtime. Qty: 90 tablet, Refills: 1    carvedilol (COREG) 12.5 MG tablet Take 1 tablet (12.5 mg total) by mouth 2 (two) times daily with a meal. Qty: 60 tablet, Refills: 1    digoxin (LANOXIN) 0.125 MG tablet Take 1 tablet (125 mcg total) by mouth daily. Qty: 60 tablet, Refills: 1    fluticasone (FLONASE) 50 MCG/ACT nasal spray Place 2 sprays into both nostrils at bedtime.     guaiFENesin-dextromethorphan (ROBITUSSIN DM) 100-10 MG/5ML syrup Take 5 mLs by mouth every 4 (four) hours as needed for cough. Qty: 118 mL, Refills: 0    hydrALAZINE (APRESOLINE) 25 MG tablet Take 1 tablet (25 mg total) by mouth 3 (three) times daily. Qty: 90 tablet, Refills: 6   Associated Diagnoses: Acute systolic congestive heart failure (HCC)    loratadine (CLARITIN) 10 MG tablet Take 10  mg by mouth daily. For allergies    losartan (COZAAR) 100 MG tablet Take 100 mg by mouth daily.     mometasone-formoterol (DULERA) 100-5 MCG/ACT AERO Inhale 2 puffs into the lungs 2 (two) times daily. Qty: 1 Inhaler, Refills: 1    omeprazole (PRILOSEC) 40 MG capsule Take 40 mg by mouth at bedtime.     potassium chloride SA (K-DUR,KLOR-CON) 20 MEQ tablet Take 1 tablet (20 mEq total) by mouth daily. Qty: 6 tablet, Refills: 0    PROAIR HFA 108 (90 BASE) MCG/ACT inhaler INHALE 2 PUFFS EVERY 4 HOURS AS NEEDED FOR WHEEZING OR SHORTNESS OF BREATH. Qty: 8.5 g, Refills: 1    spironolactone (ALDACTONE) 25 MG tablet Take 1 tablet (25 mg total) by mouth daily. Qty: 90 tablet, Refills: 3   Associated Diagnoses: Chronic systolic CHF (congestive heart  failure) (HCC)    warfarin (COUMADIN) 1 MG tablet Take 2-3 mg by mouth daily with supper. Take 3 tablets (3 mg) on Wednesday and Saturday, take 2 tablets (2 mg) on Sunday, Monday, Tuesday, Thursday, Friday       Allergies  Allergen Reactions  . Benadryl [Diphenhydramine Hcl] Nausea And Vomiting   Follow-up Information    Follow up with [facility].   Specialty:  Skilled Nursing Facility   Contact information:   [address] [place] [phone]      Follow up with Jettie Booze., MD.   Specialties:  Cardiology, Radiology, Interventional Cardiology   Why:  our office will call you with a hospital follow-up appointment    Contact information:   [address] [place] [address] [phone]        The results of significant diagnostics from this hospitalization (including imaging, microbiology, ancillary and laboratory) are listed below for reference.    Significant Diagnostic Studies: Dg Chest 2 View  12/25/2014  CLINICAL DATA:  Shortness of breath. EXAM: CHEST - 2 VIEW COMPARISON:  09/26/2014 FINDINGS: The heart size and mediastinal contours are within normal limits. Stable appearance of a biventricular pacing/ ICD device. Probable chronic pulmonary venous hypertension without overt edema. Mild atelectasis at the right lung base present. No airspace consolidation, pneumothorax or pleural effusions. The visualized skeletal structures are unremarkable. IMPRESSION: Stable chronic pulmonary venous hypertension. Right basilar atelectasis. Electronically Signed   By: Aletta Edouard M.D.   On: 12/25/2014 08:10    Microbiology: No results found for this or any previous visit (from the past 240 hour(s)).   Labs: Basic Metabolic Panel:  Recent Labs Lab 12/25/14 0807 12/26/14 0410 12/27/14 0448 12/28/14 0500 12/29/14 0430 12/29/14 0436  NA 142 142 141 141 140  --   K 3.8 4.3 4.2 4.4 4.5  --   CL 100*  100* 99* 97* 95*  --   CO2 35* 36* 35* 38* 38*  --   GLUCOSE 121* 148* 119* 123* 95  --   BUN 23* 29* 35* 40* 39*  --   CREATININE 1.76* 1.67* 1.47* 1.48* 1.42*  --   CALCIUM 9.2 8.7* 9.1 9.2 8.9  --   MG  --   --   --   --   --  2.0   Liver Function Tests:  Recent Labs Lab 12/25/14 0807  AST 19  ALT 16*  ALKPHOS 75  BILITOT 1.0  PROT 6.9  ALBUMIN 3.5   No results for input(s): LIPASE, AMYLASE in the last 168 hours. No results for input(s): AMMONIA in the last  168 hours. CBC:  Recent Labs Lab 12/25/14 0807 12/28/14 0500  WBC 6.6 15.3*  NEUTROABS 3.7  --   HGB 14.9 14.3  HCT 46.3 44.1  MCV 104.5* 103.5*  PLT 141* 134*   Cardiac Enzymes:  Recent Labs Lab 12/25/14 1641 12/25/14 2259 12/26/14 0410  TROPONINI 0.08* 0.07* 0.06*   BNP: BNP (last 3 results)  Recent Labs  04/21/14 1016 09/24/14 1700 12/25/14 0807  BNP 1883.1* 1158.8* 1078.9*    ProBNP (last 3 results)  Recent Labs  04/25/14 1205 05/02/14 1115  PROBNP 2433.0* 1499.0*    CBG: No results for input(s): GLUCAP in the last 168 hours.     Signed:  Renetta Suman A  [facility] 12/29/2014, 11:38 AM

## 2014-12-30 ENCOUNTER — Encounter (HOSPITAL_COMMUNITY)
Admission: RE | Admit: 2014-12-30 | Discharge: 2014-12-30 | Disposition: A | Discharge status: Home / Self Care | Payer: Medicare HMO | Source: Skilled Nursing Facility | Attending: Internal Medicine | Admitting: Internal Medicine

## 2014-12-30 LAB — CBC
HEMATOCRIT: 44.7 % (ref 39.0–52.0)
HEMOGLOBIN: 14.7 g/dL (ref 13.0–17.0)
MCH: 33.7 pg (ref 26.0–34.0)
MCHC: 32.9 g/dL (ref 30.0–36.0)
MCV: 102.5 fL — AB (ref 78.0–100.0)
Platelets: 129 10*3/uL — ABNORMAL LOW (ref 150–400)
RBC: 4.36 MIL/uL (ref 4.22–5.81)
RDW: 15.9 % — AB (ref 11.5–15.5)
WBC: 12.5 10*3/uL — AB (ref 4.0–10.5)

## 2014-12-30 LAB — BASIC METABOLIC PANEL
ANION GAP: 8 (ref 5–15)
BUN: 42 mg/dL — AB (ref 6–20)
CHLORIDE: 93 mmol/L — AB (ref 101–111)
CO2: 38 mmol/L — ABNORMAL HIGH (ref 22–32)
Calcium: 9.1 mg/dL (ref 8.9–10.3)
Creatinine, Ser: 1.43 mg/dL — ABNORMAL HIGH (ref 0.61–1.24)
GFR, EST AFRICAN AMERICAN: 54 mL/min — AB (ref >60)
GFR, EST NON AFRICAN AMERICAN: 47 mL/min — AB (ref >60)
Glucose, Bld: 110 mg/dL — ABNORMAL HIGH (ref 65–99)
POTASSIUM: 4 mmol/L (ref 3.5–5.1)
SODIUM: 139 mmol/L (ref 135–145)

## 2014-12-30 LAB — PROTIME-INR
INR: 2 — AB (ref 0.00–1.49)
Prothrombin Time: 22.5 seconds — ABNORMAL HIGH (ref 11.6–15.2)

## 2015-01-02 ENCOUNTER — Encounter (HOSPITAL_COMMUNITY)
Admission: RE | Admit: 2015-01-02 | Discharge: 2015-01-02 | Disposition: A | Discharge status: Home / Self Care | Payer: Medicare HMO | Source: Skilled Nursing Facility | Attending: Internal Medicine | Admitting: Internal Medicine

## 2015-01-02 ENCOUNTER — Ambulatory Visit (HOSPITAL_COMMUNITY): Payer: Medicare HMO | Attending: Internal Medicine

## 2015-01-02 ENCOUNTER — Encounter: Payer: Self-pay | Admitting: Internal Medicine

## 2015-01-02 ENCOUNTER — Non-Acute Institutional Stay (SKILLED_NURSING_FACILITY): Payer: Medicare HMO | Admitting: Internal Medicine

## 2015-01-02 DIAGNOSIS — N183 Chronic kidney disease, stage 3 unspecified: Secondary | ICD-10-CM

## 2015-01-02 DIAGNOSIS — N179 Acute kidney failure, unspecified: Secondary | ICD-10-CM | POA: Diagnosis not present

## 2015-01-02 DIAGNOSIS — J449 Chronic obstructive pulmonary disease, unspecified: Secondary | ICD-10-CM

## 2015-01-02 DIAGNOSIS — I5022 Chronic systolic (congestive) heart failure: Secondary | ICD-10-CM | POA: Diagnosis not present

## 2015-01-02 DIAGNOSIS — I482 Chronic atrial fibrillation, unspecified: Secondary | ICD-10-CM

## 2015-01-02 DIAGNOSIS — J90 Pleural effusion, not elsewhere classified: Secondary | ICD-10-CM | POA: Insufficient documentation

## 2015-01-02 DIAGNOSIS — R0603 Acute respiratory distress: Principal | ICD-10-CM

## 2015-01-02 DIAGNOSIS — J441 Chronic obstructive pulmonary disease with (acute) exacerbation: Secondary | ICD-10-CM

## 2015-01-02 LAB — CBC WITH DIFFERENTIAL/PLATELET
BASOS ABS: 0 10*3/uL (ref 0.0–0.1)
Basophils Relative: 0 %
EOS ABS: 0.3 10*3/uL (ref 0.0–0.7)
EOS PCT: 3 %
HCT: 44.8 % (ref 39.0–52.0)
Hemoglobin: 14.9 g/dL (ref 13.0–17.0)
LYMPHS ABS: 2.3 10*3/uL (ref 0.7–4.0)
Lymphocytes Relative: 19 %
MCH: 34.4 pg — AB (ref 26.0–34.0)
MCHC: 33.3 g/dL (ref 30.0–36.0)
MCV: 103.5 fL — ABNORMAL HIGH (ref 78.0–100.0)
Monocytes Absolute: 1.3 10*3/uL — ABNORMAL HIGH (ref 0.1–1.0)
Monocytes Relative: 10 %
Neutro Abs: 8.3 10*3/uL — ABNORMAL HIGH (ref 1.7–7.7)
Neutrophils Relative %: 68 %
PLATELETS: 153 10*3/uL (ref 150–400)
RBC: 4.33 MIL/uL (ref 4.22–5.81)
RDW: 15.7 % — ABNORMAL HIGH (ref 11.5–15.5)
WBC: 12.2 10*3/uL — AB (ref 4.0–10.5)

## 2015-01-02 LAB — BASIC METABOLIC PANEL
Anion gap: 6 (ref 5–15)
BUN: 59 mg/dL — AB (ref 6–20)
CO2: 38 mmol/L — ABNORMAL HIGH (ref 22–32)
CREATININE: 1.75 mg/dL — AB (ref 0.61–1.24)
Calcium: 9.1 mg/dL (ref 8.9–10.3)
Chloride: 95 mmol/L — ABNORMAL LOW (ref 101–111)
GFR calc Af Amer: 42 mL/min — ABNORMAL LOW (ref >60)
GFR, EST NON AFRICAN AMERICAN: 37 mL/min — AB (ref >60)
Glucose, Bld: 123 mg/dL — ABNORMAL HIGH (ref 65–99)
Potassium: 4.5 mmol/L (ref 3.5–5.1)
SODIUM: 139 mmol/L (ref 135–145)

## 2015-01-02 LAB — PROTIME-INR
INR: 2.3 — ABNORMAL HIGH (ref 0.00–1.49)
PROTHROMBIN TIME: 25.1 s — AB (ref 11.6–15.2)

## 2015-01-02 NOTE — Progress Notes (Signed)
This encounter was created in error - please disregard.

## 2015-01-02 NOTE — Progress Notes (Signed)
Patient ID: Troy [name], male   DOB: 1940-02-11, 74 y.o.   MRN: [id number]    Facility; [facility] Chief complaint; admission to SNF post admit to [facility] from 12/4 to 12/29/2014  History; this is a 74 year old man with a history of COPD as well as an ischemic cardiomyopathy with an ejection fraction of 20%. He presented to the ER with a gradual onset of increasing shortness of breath. He had seen cardiology at the end of November Aldactone was added to his usual dose of Lasix however this did not help. He had a nonproductive cough but no fever. He has not been on home oxygen but does have nebulizers at home. His discharge summary states that home oxygen was recommended but not implemented. In the hospital he was felt to have CHF as well as a mild COPD exacerbation. He comes out on 2 L of oxygen. His Lasix was increased by cardiology to 80 mg twice a day and follow-up with cardiology in 1 week. Troponins were mildly elevated felt to be secondary to demand ischemia no invasive workup was recommended. The patient also has chronic atrial fibrillation with a pacer/defibrillator. He is on chronic Coumadin. He was found also to have acute on chronic kidney failure baseline kidney function with a creatinine of 1.3-1.4 stage III  BMP Latest Ref Rng 12/30/2014 12/29/2014 12/28/2014  Glucose 65 - 99 mg/dL 110(H) 95 123(H)  BUN 6 - 20 mg/dL 42(H) 39(H) 40(H)  Creatinine 0.61 - 1.24 mg/dL 1.43(H) 1.42(H) 1.48(H)  Sodium 135 - 145 mmol/L 139 140 141  Potassium 3.5 - 5.1 mmol/L 4.0 4.5 4.4  Chloride 101 - 111 mmol/L 93(L) 95(L) 97(L)  CO2 22 - 32 mmol/L 38(H) 38(H) 38(H)  Calcium 8.9 - 10.3 mg/dL 9.1 8.9 9.2   CBC Latest Ref Rng 12/30/2014 12/28/2014 12/25/2014  WBC 4.0 - 10.5 K/uL 12.5(H) 15.3(H) 6.6  Hemoglobin 13.0 - 17.0 g/dL 14.7 14.3 14.9  Hematocrit 39.0 - 52.0 % 44.7 44.1 46.3  Platelets 150 - 400 K/uL 129(L) 134(L) 141(L)         CLINICAL DATA:  Shortness of breath.   EXAM: CHEST - 2 VIEW     COMPARISON:  09/26/2014   FINDINGS: The heart size and mediastinal contours are within normal limits. Stable appearance of a biventricular pacing/ ICD device. Probable chronic pulmonary venous hypertension without overt edema. Mild atelectasis at the right lung base present. No airspace consolidation, pneumothorax or pleural effusions. The visualized skeletal structures are unremarkable.   IMPRESSION: Stable chronic pulmonary venous hypertension. Right basilar atelectasis.     Electronically Signed   By: Aletta Edouard M.D.   On: 12/25/2014 08:10         CLINICAL DATA:  Shortness of breath.   EXAM: CHEST - 2 VIEW   COMPARISON:  09/26/2014   FINDINGS: The heart size and mediastinal contours are within normal limits. Stable appearance of a biventricular pacing/ ICD device. Probable chronic pulmonary venous hypertension without overt edema. Mild atelectasis at the right lung base present. No airspace consolidation, pneumothorax or pleural effusions. The visualized skeletal structures are unremarkable.   IMPRESSION: Stable chronic pulmonary venous hypertension. Right basilar atelectasis.     Electronically Signed   By: Aletta Edouard M.D.   On: 12/25/2014 08:10     Past medical history Past Medical History  Diagnosis Date  . CAD (coronary artery disease) 1996    status post PCI of the RCA   . Ischemic dilated cardiomyopathy   .  HTN (hypertension)   . GERD (gastroesophageal reflux disease)   . Personal history of DVT (deep vein thrombosis)   . Diabetes mellitus, type 2 ([place])   . DJD (degenerative joint disease)   . Gout   . Prostatitis   . Nephrolithiasis   . Atrial fibrillation ([place])   . Congestive heart failure, unspecified   . Other primary cardiomyopathies   . COPD (chronic obstructive pulmonary disease) (HCC)     Pt on home O2 at night and PRN, unsure of date of diagnosis.   Past Surgical History  Procedure Laterality Date  . Back surgery    .  Coronary stent placement    . Cardiac defibrillator placement    . Pacemaker insertion      Current Outpatient Prescriptions on File Prior to Visit  Medication Sig Dispense Refill  . acetaminophen (TYLENOL) 500 MG tablet Take 500 mg by mouth every 6 (six) hours as needed for mild pain or moderate pain.     Marland Kitchen albuterol (ACCUNEB) 1.25 MG/3ML nebulizer solution INHALE ONE VIAL VIA NEBULIZER EVERY 6 HOURS AS NEEDED FOR WHEEZING. 75 mL 5  . alfuzosin (UROXATRAL) 10 MG 24 hr tablet Take 10 mg by mouth daily with breakfast.     . allopurinol (ZYLOPRIM) 100 MG tablet Take 100 mg by mouth daily.    Marland Kitchen atorvastatin (LIPITOR) 10 MG tablet Take 1 tablet (10 mg total) by mouth at bedtime. 90 tablet 1  . carvedilol (COREG) 12.5 MG tablet Take 1 tablet (12.5 mg total) by mouth 2 (two) times daily with a meal. 60 tablet 1  . digoxin (LANOXIN) 0.125 MG tablet Take 1 tablet (125 mcg total) by mouth daily. 60 tablet 1  . fluticasone (FLONASE) 50 MCG/ACT nasal spray Place 2 sprays into both nostrils at bedtime.     . furosemide (LASIX) 80 MG tablet Take 1 tablet (80 mg total) by mouth 2 (two) times daily.    Marland Kitchen guaiFENesin-dextromethorphan (ROBITUSSIN DM) 100-10 MG/5ML syrup Take 5 mLs by mouth every 4 (four) hours as needed for cough. 118 mL 0  . hydrALAZINE (APRESOLINE) 25 MG tablet Take 1 tablet (25 mg total) by mouth 3 (three) times daily. 90 tablet 6  . loratadine (CLARITIN) 10 MG tablet Take 10 mg by mouth daily. For allergies    . losartan (COZAAR) 100 MG tablet Take 100 mg by mouth daily.     . mometasone-formoterol (DULERA) 100-5 MCG/ACT AERO Inhale 2 puffs into the lungs 2 (two) times daily. 1 Inhaler 1  . omeprazole (PRILOSEC) 40 MG capsule Take 40 mg by mouth at bedtime.     . potassium chloride SA (K-DUR,KLOR-CON) 20 MEQ tablet Take 1 tablet (20 mEq total) by mouth daily. 6 tablet 0  . PROAIR HFA 108 (90 BASE) MCG/ACT inhaler INHALE 2 PUFFS EVERY 4 HOURS AS NEEDED FOR WHEEZING OR SHORTNESS OF BREATH.  8.5 g 1  . spironolactone (ALDACTONE) 25 MG tablet Take 1 tablet (25 mg total) by mouth daily. 90 tablet 3  . warfarin (COUMADIN) 1 MG tablet Take 2-3 mg by mouth daily with supper. Take 3 tablets (3 mg) on Wednesday and Saturday, take 2 tablets (2 mg) on Sunday, Monday, Tuesday, Thursday, Friday     Social; patient tells me he lives on his own in [place] uses a cane. Not on oxygen at home but has nebulizers. Independent with ADLs and IADLs  reports that he quit smoking about 34 years ago. His smoking use included Cigarettes. He has a 200  pack-year smoking history. He has never used smokeless tobacco. He reports that he does not drink alcohol or use illicit drugs.  iFam indicated that his mother is deceased. He indicated that his father is deceased. He indicated that his brother is deceased.   Review of systems Gen. patient states he feels stable. HEENT; wonders why his nasal inhaler was discontinued in the hospital [allergic rhinitis) Respiratory; bothered by loose cough and he cannot get up any sputum. Short of breath on exertion Cardiac no complaints of chest heaviness pressure sharp pain etc. GI; states he has not see a with the feeling that he might vomit after walking back and forth to the bathroom. With rest this improves. No change in bowel habits no abdominal pain GU no dysuria no hematuria Musculoskeletal; no pain Neurologic no focal weakness states he can walk and is stable Endocrine not a diabetic; has not been on chronic prednisone. Steroids were not used in the hospital Mental status; no depressive symptoms  Physical examination Gen. patient is tachypneic with bothersome loose cough Vitals; temperature 97.3, pulse 79 and regular respirations 24 blood pressure 152/76 O2 sat is 95% on 2 L HEENT oral exam is normal Respiratory; very poor air entry bilaterally, no air entry over the right lower lobe with dullness to percussion. There are crackles over the right middle lobe and  left lower lobe. There is accessory muscle use. No clubbing Cardiac; heart sounds are distant probably A. fib no murmurs JVP is not elevated no signs of congestive heart failure Abdomen; no liver no spleen no tenderness no masses GU abdomen is not distended there is no CVA tenderness Extremities; venous insufficiency and very [place] but minimal edema no signs of a DVT Neurologic; non-lateralizing. Good strength bilaterally. Gait; he is able to bring himself to a sitting and standing position mildly wide-based Mental status; I see no abnormalities here is able to give his own history. No evidence of depression or delirium  Impression/plan #1 COPD acute; his respiratory status remains worrisome. He is already on albuterol every 6 hours when necessary, Dulera 2 puffs twice a day, pro-air 2 puffs every 4 when necessary. Nevertheless he has accessory muscle use Poor entry prolonged expiratory phase. #2; history of systolic congestive heart failure. His Lasix was increased although I'm not sure of his baseline dose currently at 80 twice a day and Aldactone 25 daily There is no overt heart failure signs although I wonder whether he has a right pleural effusion #3 acute on chronic renal failure. Chronic renal failure stage III #4 atrial fibrillation he remains on digoxin, Coreg at 12.5 twice a day. His heart rate is controlled. He is also on Coumadin 3 mg Wednesday and Saturday and 2 mg other days. Hopefully his INR was checked today. #5 hyperlipidemia on Lipitor #6 gout on allopurinol 100 mg daily there is no evidence of this at the bedside #7 history of allergic rhinitis on Claritin. He was also on a nasal inhaler although apparently this was stopped. #8 gastroesophageal reflux on Prilosec #9 history of coronary artery disease with severe ischemic cardiomyopathy. His discussion about nausea with activity is a bit worrisome. I'm going to check echocardiogram on him #10 nephrolithiasis  The  patient's respiratory status remains tentative in my view. I'm going to send him for a 2 view chest x-ray. Put him on routine nebulizers. Steroids may be necessary. I am not convinced about congestive heart failure at this point but would like to see the chest x-ray as  soon as possible. I'm going to do a baseline EKG for comparison of those done in the hospital

## 2015-01-04 ENCOUNTER — Encounter (HOSPITAL_COMMUNITY)
Admission: AD | Admit: 2015-01-04 | Discharge: 2015-01-04 | Disposition: A | Discharge status: Home / Self Care | Payer: Medicare HMO | Source: Skilled Nursing Facility | Attending: Internal Medicine | Admitting: Internal Medicine

## 2015-01-04 ENCOUNTER — Non-Acute Institutional Stay (SKILLED_NURSING_FACILITY): Payer: Medicare HMO | Admitting: Internal Medicine

## 2015-01-04 DIAGNOSIS — I5022 Chronic systolic (congestive) heart failure: Secondary | ICD-10-CM

## 2015-01-04 DIAGNOSIS — J441 Chronic obstructive pulmonary disease with (acute) exacerbation: Secondary | ICD-10-CM | POA: Diagnosis not present

## 2015-01-04 LAB — BASIC METABOLIC PANEL
ANION GAP: 4 — AB (ref 5–15)
BUN: 52 mg/dL — ABNORMAL HIGH (ref 6–20)
CALCIUM: 9.4 mg/dL (ref 8.9–10.3)
CHLORIDE: 95 mmol/L — AB (ref 101–111)
CO2: 39 mmol/L — ABNORMAL HIGH (ref 22–32)
CREATININE: 1.61 mg/dL — AB (ref 0.61–1.24)
GFR calc non Af Amer: 40 mL/min — ABNORMAL LOW (ref >60)
GFR, EST AFRICAN AMERICAN: 47 mL/min — AB (ref >60)
Glucose, Bld: 136 mg/dL — ABNORMAL HIGH (ref 65–99)
Potassium: 4.9 mmol/L (ref 3.5–5.1)
SODIUM: 138 mmol/L (ref 135–145)

## 2015-01-04 LAB — BRAIN NATRIURETIC PEPTIDE: B NATRIURETIC PEPTIDE 5: 188 pg/mL — AB (ref 0.0–100.0)

## 2015-01-04 LAB — DIGOXIN LEVEL: DIGOXIN LVL: 1.8 ng/mL (ref 0.8–2.0)

## 2015-01-09 ENCOUNTER — Encounter (HOSPITAL_COMMUNITY)
Admission: RE | Admit: 2015-01-09 | Discharge: 2015-01-09 | Disposition: A | Discharge status: Home / Self Care | Payer: Medicare HMO | Source: Skilled Nursing Facility | Attending: Internal Medicine | Admitting: Internal Medicine

## 2015-01-09 LAB — BASIC METABOLIC PANEL
ANION GAP: 3 — AB (ref 5–15)
BUN: 57 mg/dL — ABNORMAL HIGH (ref 6–20)
CHLORIDE: 98 mmol/L — AB (ref 101–111)
CO2: 39 mmol/L — ABNORMAL HIGH (ref 22–32)
Calcium: 9.5 mg/dL (ref 8.9–10.3)
Creatinine, Ser: 1.76 mg/dL — ABNORMAL HIGH (ref 0.61–1.24)
GFR calc non Af Amer: 36 mL/min — ABNORMAL LOW (ref >60)
GFR, EST AFRICAN AMERICAN: 42 mL/min — AB (ref >60)
Glucose, Bld: 109 mg/dL — ABNORMAL HIGH (ref 65–99)
POTASSIUM: 4.8 mmol/L (ref 3.5–5.1)
SODIUM: 140 mmol/L (ref 135–145)

## 2015-01-09 LAB — PROTIME-INR
INR: 4.07 — AB (ref 0.00–1.49)
PROTHROMBIN TIME: 38.5 s — AB (ref 11.6–15.2)

## 2015-01-11 ENCOUNTER — Encounter (HOSPITAL_COMMUNITY)
Admission: AD | Admit: 2015-01-11 | Discharge: 2015-01-11 | Disposition: A | Discharge status: Home / Self Care | Payer: Medicare HMO | Source: Skilled Nursing Facility | Attending: Internal Medicine | Admitting: Internal Medicine

## 2015-01-11 LAB — PROTIME-INR
INR: 3.29 — ABNORMAL HIGH (ref 0.00–1.49)
Prothrombin Time: 32.8 seconds — ABNORMAL HIGH (ref 11.6–15.2)

## 2015-01-12 NOTE — Progress Notes (Addendum)
Patient ID: Troy [name], male   DOB: 1941-01-04, 74 y.o.   MRN: [id number]                PROGRESS NOTE  DATE:  01/04/2015       FACILITY: [facility]                  LEVEL OF CARE:   SNF   Acute Visit                   CHIEF COMPLAINT:  Review, status post admission on 01/02/2015 with increasing shortness of breath.      HISTORY OF PRESENT ILLNESS:  This gentleman is a man who came to [place] from the hospital with acute exacerbation of COPD.    When I saw him two days ago, he was working hard to breathe with accessory muscle use.  He is already on an aggressive medical regimen for this including Dulera 100/5, 2 puffs b.i.d., albuterol nebulizers p.r.n.  I changed his nebulizers to q.4 and q.2 p.r.n.  A chest x-ray was done that was negative other than COPD.  His BNP is 188, down considerably from the recorded previous value of over 1000.  He is on Lasix 80 b.i.d.     LABORATORY DATA:   Lab work from today:    Basic metabolic panel:   Sodium 0000000, potassium 4.9, CO2 of 39, BUN 52, creatinine 1.61.    The 1.61 is slightly elevated versus prior values, although I see on 12/26/2014 that he was as high as 1.67.  This will need to be followed.  I had already reduced his Lasix to 60 b.i.d. based on a creatinine of 1.75 two days ago.  We will continue on 60 b.i.d.    Past Medical History  Diagnosis Date  . CAD (coronary artery disease) 1996    status post PCI of the RCA   . Ischemic dilated cardiomyopathy   . HTN (hypertension)   . GERD (gastroesophageal reflux disease)   . Personal history of DVT (deep vein thrombosis)   . Diabetes mellitus, type 2 ([place])   . DJD (degenerative joint disease)   . Gout   . Prostatitis   . Nephrolithiasis   . Atrial fibrillation ([place])   . Congestive heart failure, unspecified   . Other primary cardiomyopathies   . COPD (chronic obstructive pulmonary disease) (HCC)     Pt on home O2 at night and PRN, unsure of date of diagnosis.     REVIEW OF SYSTEMS:    GENERAL:  The patient states he does not feel febrile.   CHEST/RESPIRATORY:  He is not coughing.  However, he is short of breath on exertion.  I had a conversation with him about this.  He apparently cannot walk half a block outside, so his COPD may be limiting.   CARDIAC:  No chest pain.   GI:  No abdominal pain.   No nausea or vomiting.   GU:  No dysuria.    EXTREMITIES: No edema noted by the patient.   PHYSICAL EXAMINATION:   VITAL SIGNS:     RESPIRATIONS:  20. P2 st 95% on O2, P-84 WEIGHT:  157.2 pounds, basically stable.    CHEST/RESPIRATORY:  Very poor air entry bilaterally.  Mild expiratory wheezing.   There is no accessory muscle use today.     CARDIOVASCULAR:   CARDIAC:  Heart sounds are normal.  His JVP may be visible at 45, borderline.  GASTROINTESTINAL:   ABDOMEN:  Soft, nontender.  No masses.   EXTREMITIES: no edema, no evidence of a DVT     ASSESSMENT/PLAN:                 Likely severe COPD.   I am going to continue his nebulizers at 2.5 q.i.d. and q.2 p.r.n.    History of a cardiomyopathy.  I think his Lasix at 60 b.i.d. is reasonable.  I will continue this next week.     At this point, I do not think that the patient requires steroids.  He seems more stable than the other day, although his COPD has been historically very limiting in terms of his activity.

## 2015-01-13 ENCOUNTER — Encounter (HOSPITAL_COMMUNITY)
Admission: AD | Admit: 2015-01-13 | Discharge: 2015-01-13 | Disposition: A | Discharge status: Home / Self Care | Payer: Medicare HMO | Source: Skilled Nursing Facility | Attending: Internal Medicine | Admitting: Internal Medicine

## 2015-01-13 LAB — PROTIME-INR
INR: 2.78 — AB (ref 0.00–1.49)
PROTHROMBIN TIME: 28.9 s — AB (ref 11.6–15.2)

## 2015-01-16 ENCOUNTER — Encounter (HOSPITAL_COMMUNITY)
Admission: RE | Admit: 2015-01-16 | Discharge: 2015-01-16 | Disposition: A | Discharge status: Home / Self Care | Payer: Medicare HMO | Source: Skilled Nursing Facility | Attending: Internal Medicine | Admitting: Internal Medicine

## 2015-01-16 LAB — PROTIME-INR
INR: 2.65 — AB (ref 0.00–1.49)
Prothrombin Time: 27.9 seconds — ABNORMAL HIGH (ref 11.6–15.2)

## 2015-01-18 ENCOUNTER — Non-Acute Institutional Stay (SKILLED_NURSING_FACILITY): Payer: Medicare HMO | Admitting: Internal Medicine

## 2015-01-18 DIAGNOSIS — I482 Chronic atrial fibrillation, unspecified: Secondary | ICD-10-CM

## 2015-01-18 DIAGNOSIS — N183 Chronic kidney disease, stage 3 unspecified: Secondary | ICD-10-CM

## 2015-01-18 DIAGNOSIS — K30 Functional dyspepsia: Secondary | ICD-10-CM

## 2015-01-18 DIAGNOSIS — J441 Chronic obstructive pulmonary disease with (acute) exacerbation: Secondary | ICD-10-CM | POA: Diagnosis not present

## 2015-01-18 DIAGNOSIS — I9589 Other hypotension: Secondary | ICD-10-CM

## 2015-01-18 DIAGNOSIS — R109 Unspecified abdominal pain: Secondary | ICD-10-CM

## 2015-01-18 DIAGNOSIS — I5023 Acute on chronic systolic (congestive) heart failure: Secondary | ICD-10-CM

## 2015-01-18 NOTE — Progress Notes (Signed)
Patient ID: Troy [name], male   DOB: [birth year], 74 y.o.   MRN: [id number]      This is an acute visit.  Level care skilled.  Facility [facility].  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 He presented to the ER with a gradual onset of increasing shortness of breath. He had seen cardiology at the end of November Aldactone was added to his usual dose of Lasix however this did not help. . In the hospital he was felt to have CHF as well as a mild COPD exacerbation. He comes out on 2 L of oxygen. His Lasix was increased by cardiology to 80 mg twice a day and follow-up with cardiology This subsequently has been reduced to 60 mg twice a day by Dr. Dellia Nims secondary to a rising creatinine.  Most recent creatinine was 1.76 on December 19 we will recheck this . The patient also has chronic atrial fibrillation with a pacer/defibrillator. He is on chronic Coumadin. He was found also to have acute on chronic kidney failure baseline kidney function with a creatinine of 1.3-1.4 stage III--again most recent lab was slightly above his baseline at 1.76 we will update this.  His weight continues to be stable currently at 157.2.  Nursing staff has noted occasionally some low blood pressures occasionally with systolics in the 0000000 previous list of ones 112/39-108/52-93/70 199/41-100/60-the highest one that I see is 142/46 and this appears to be fairly unusual.--I got 96/44 manually this afternoon  He is on numerous agents including Coreg 12.5 mg twice a day-hydralazine 25 mg 3 times a day-Cozaar 100 mg daily.  He is also on Aldactone 25 mg daily and Lasix 60 mg twice a day  He does not appear to be overtly symptomatic of hypotension with increased dizziness or syncopal-type  feelings.  Patient also has severe COPD he is oxygen dependent Dr. Dellia Nims did recently see him and made his albuterol nebulizers routine every 4 hours and when necessary every 2 this appears to be helping he also continues on Dulera--as well as Flonase. He actually appears did do quite well with this-there is no labored breathing-he appears to be significant more comfortable than apparently the way he presented initially  Regards atrial fibrillation he is on Coreg for rate control--Digoxin- as well as Coumadin for anticoagulation most recent INR 2.65 on 2 mg of Coumadin update INR has been ordered  Patient has complained of some stomach discomfort the last couple days-he states this is more after he eats the food appears to "run through me"-complains of some abdominal discomfort after eating that resolves after he goes to the bathroom.  Currently he is not complaining of any nausea or vomiting there is been no nausea and vomiting noted the last couple days either.  Says his stomach is not hurting appears to be doing fine at this point     Labs.  01/16/2015.  INR-2.65.  01/09/2015.  Sodium 140 potassium 4.8 BUN 57 creatinine 1.76.  01/04/2015.  Digoxin 1.8.  01/02/2015.  WBC 12.2 this appears to be trending down-hemoglobin 14.9-platelets 153.  12/29/2014.  Magnesium 2.0.  12/25/2014.  Albumin 3.5-ALT 16-otherwise liver function tests within normal limits.  I do note on December 4 a BNP was listed as 1078-however Dr. Janalyn Rouse note shows an updated BNP is significantly  improved at 100 .      BMP Latest Ref Rng 12/30/2014 12/29/2014 12/28/2014  Glucose 65 - 99 mg/dL 110(H) 95 123(H)  BUN 6 - 20 mg/dL 42(H) 39(H) 40(H)  Creatinine 0.61 - 1.24 mg/dL 1.43(H) 1.42(H) 1.48(H)  Sodium 135 - 145 mmol/L 139 140 141  Potassium 3.5 - 5.1 mmol/L 4.0 4.5 4.4  Chloride 101 - 111 mmol/L 93(L) 95(L) 97(L)  CO2 22 - 32 mmol/L 38(H) 38(H) 38(H)  Calcium 8.9 - 10.3 mg/dL 9.1 8.9 9.2    CBC Latest Ref Rng 12/30/2014 12/28/2014 12/25/2014  WBC 4.0 - 10.5 K/uL 12.5(H) 15.3(H) 6.6  Hemoglobin 13.0 - 17.0 g/dL 14.7 14.3 14.9  Hematocrit 39.0 - 52.0 % 44.7 44.1 46.3  Platelets 150 - 400 K/uL 129(L) 134(L) 141(L)         CLINICAL DATA:  Shortness of breath.   EXAM: CHEST - 2 VIEW   COMPARISON:  09/26/2014   FINDINGS: The heart size and mediastinal contours are within normal limits. Stable appearance of a biventricular pacing/ ICD device. Probable chronic pulmonary venous hypertension without overt edema. Mild atelectasis at the right lung base present. No airspace consolidation, pneumothorax or pleural effusions. The visualized skeletal structures are unremarkable.   IMPRESSION: Stable chronic pulmonary venous hypertension. Right basilar atelectasis.     Electronically Signed   By: Aletta Edouard M.D.   On: 12/25/2014 08:10         CLINICAL DATA:  Shortness of breath.   EXAM: CHEST - 2 VIEW   COMPARISON:  09/26/2014   FINDINGS: The heart size and mediastinal contours are within normal limits. Stable appearance of a biventricular pacing/ ICD device. Probable chronic pulmonary venous hypertension without overt edema. Mild atelectasis at the right lung base present. No airspace consolidation, pneumothorax or pleural effusions. The visualized skeletal structures are unremarkable.   IMPRESSION: Stable chronic pulmonary venous hypertension. Right basilar atelectasis.     Electronically Signed   By: Aletta Edouard M.D.   On: 12/25/2014 08:10     Past medical history Past Medical History  Diagnosis Date  . CAD (coronary artery disease) 1996    status post PCI of the RCA   . Ischemic dilated cardiomyopathy   . HTN (hypertension)   . GERD (gastroesophageal reflux disease)   . Personal history of DVT (deep vein thrombosis)   . Diabetes mellitus, type 2 ([place])   . DJD (degenerative joint disease)   . Gout   . Prostatitis   . Nephrolithiasis    . Atrial fibrillation ([place])   . Congestive heart failure, unspecified   . Other primary cardiomyopathies   . COPD (chronic obstructive pulmonary disease) (HCC)     Pt on home O2 at night and PRN, unsure of date of diagnosis.   Past Surgical History  Procedure Laterality Date  . Back surgery    . Coronary stent placement    . Cardiac defibrillator placement    . Pacemaker insertion      Current Outpatient Prescriptions on File Prior to Visit  Medication Sig Dispense Refill  . acetaminophen (TYLENOL) 500 MG tablet Take 500 mg by mouth every 6 (six) hours as needed for mild pain or moderate pain.     Marland Kitchen albuterol (ACCUNEB) 1.25 MG/3ML nebulizer solution INHALE ONE VIAL VIA NEBULIZER EVERY 6 HOURS AS NEEDED FOR WHEEZING. 75 mL 5  . alfuzosin (UROXATRAL) 10 MG 24 hr tablet Take 10 mg by mouth daily with breakfast.     . allopurinol (ZYLOPRIM) 100  MG tablet Take 100 mg by mouth daily.    Marland Kitchen atorvastatin (LIPITOR) 10 MG tablet Take 1 tablet (10 mg total) by mouth at bedtime. 90 tablet 1  . carvedilol (COREG) 12.5 MG tablet Take 1 tablet (12.5 mg total) by mouth 2 (two) times daily with a meal. 60 tablet 1  . digoxin (LANOXIN) 0.125 MG tablet Take 1 tablet (125 mcg total) by mouth daily. 60 tablet 1  . fluticasone (FLONASE) 50 MCG/ACT nasal spray Place 2 sprays into both nostrils at bedtime.     . furosemide (LASIX) 60 MG tablet Take 1 tablet (60 mg total) by mouth 2 (two) times daily.    Marland Kitchen guaiFENesin-dextromethorphan (ROBITUSSIN DM) 100-10 MG/5ML syrup Take 5 mLs by mouth every 4 (four) hours as needed for cough. 118 mL 0  . hydrALAZINE (APRESOLINE) 25 MG tablet Take 1 tablet (25 mg total) by mouth 3 (three) times daily. 90 tablet 6  . loratadine (CLARITIN) 10 MG tablet Take 10 mg by mouth daily. For allergies    . losartan (COZAAR) 100 MG tablet Take 100 mg by mouth daily.     . mometasone-formoterol (DULERA) 100-5 MCG/ACT AERO Inhale 2 puffs into the lungs 2 (two) times daily. 1 Inhaler 1   . omeprazole (PRILOSEC) 40 MG capsule Take 40 mg by mouth at bedtime.     . potassium chloride SA (K-DUR,KLOR-CON) 20 MEQ tablet Take 1 tablet (20 mEq total) by mouth daily. 6 tablet 0  . PROAIR HFA 108 (90 BASE) MCG/ACT inhaler INHALE 2 PUFFS EVERY 4 HOURS AS NEEDED FOR WHEEZING OR SHORTNESS OF BREATH. 8.5 g 1  . spironolactone (ALDACTONE) 25 MG tablet Take 1 tablet (25 mg total) by mouth daily. 90 tablet 3  . warfarin (COUMADIN) 1 MG tablet Take 2- mg by mouth daily with supper.      Social; patient  lived on his own in [place] uses a cane. Not on oxygen at home but has nebulizers. Independent with ADLs and IADLs  reports that he quit smoking about 34 years ago. His smoking use included Cigarettes. He has a 200 pack-year smoking history. He has never used smokeless tobacco. He reports that he does not drink alcohol or use illicit drugs.  iFam indicated that his mother is deceased. He indicated that his father is deceased. He indicated that his brother is deceased.   Review of systems Gen. patient states he feels stable per nursing doing much better than originally Skin does not complain of rashes or itching. HEENT;  Does not complain of visual changes-does have history of allergic rhinitis but apparently this has improved on the inhaler.  Respiratory is not complaining of any increased shortness of breath beyond baseline does have a somewhat chronic cough Cardiac no complaints of chest heaviness pressure sharp pain etc. GI;  As stated above doesn't complaining apparently of some stomach discomfort after eating the last couple days this apparently quickly resolves in fact does not complaining of abdominal pain currently does not complain of any nausea or vomiting associated with this GU no dysuria no hematuria Musculoskeletal; no complaints of pain currently Neurologic no focal weakness states he can walk and is stable ears to have gained some strength here He is complaining of  insomnia Endocrine not a diabetic; has not been on chronic prednisone. Steroids were not used in the hospital Mental status; no depressive symptoms bright alert talkative  Physical examination   Temperature is 97.7 pulse 84 respirations 24 blood pressure taken manually 96/44 weight  is stable at 157.8.  In general this is a pleasant elderly male in no distress sitting comfortably in his wheelchair.  His skin is warm and dry HEENT oral exam is norma mucous membranes moist throat is clear l Respiratory; very poor air entry bilaterally this apparently is not new-I cannot really hear any wheezing or congestion however-there is no accessory muscle use currently  Cardiac; heart sounds are distant probably --sounded regular-- no murmurs JVP is not elevated no signs of congestive heart failure Abdomen; soft no tenderness no masses--bowel sounds are positive  Extremities; venous insufficiency and varicosities but minimal edema no signs of a DVT Neurologic; non-lateralizing. Good strength bilaterally.  Mental status; I see no abnormalities here is able to give his own history. No evidence of depression or delirium--pleasant talkative  Impression/plan #1 COPD acute; his respiratory status appears improved-he is on albuterol nebulizers routinely 4 hours. He is already on albuterol every 2 hours when necessary, Dulera 2 puffs twice a day, pro-air 2 puffs every 4 when necessary.  #2; history of systolic congestive heart failure.--- This appears stabilized his weights are stable he is currently on Lasix 60 mg twice a day as well as Aldactone 25 mg a day-again respiratory status appears significantly improved   #3 acute on chronic renal failure. Chronic renal failure stage III--last lab on December 19 shows a creatinine of 1.76 which appears to be on the higher end of baseline Will update this tomorrow-Dr. Dellia Nims did reduce his Lasix slightly previously  #4 atrial fibrillation he remains on digoxin,  Coreg at 12.5 twice a day. His heart rate is controlled. He is also on Coumadin 2 mg a day-INR therapeutic at 2.65 as of December 26 update INR is pending  #6-history of stomach pain-this appears transitory-there is a GI virus in the facility  one would wonder possibly if this is the cause-will update a metabolic panel including a CMP for liver function tests-this afternoon he appears to be doing fine however-one would suspect possibly this may be a passing virus again will see how he does clinically here. I do note he has a history of GERD is on Prilosec  #7 insomnia will start melatonin as needed at night and monitor  #8 hypotension-he appears to be relatively asymptomatic appears to have somewhat frequent systolic readings in the 0000000 will reduce his Cozaar to 50 mg a day and have this held for systolic blood pressure less than 105-continues on Coreg 12.5 mg twice a day with history of atrial fibrillation also on hydralazine fairly low dose at 25 mg 3 times a day again will monitor clinically appears to be doing okay however  [id number]-of note greater than 40 minutes spent assessing patient-discussing his status with nursing staff-reviewing his chart-and coordinating and formulating a plan of care for numerous diagnoses-no greater than 50% of time spent coordinating plan of care

## 2015-01-20 ENCOUNTER — Non-Acute Institutional Stay (SKILLED_NURSING_FACILITY): Payer: Medicare HMO | Admitting: Internal Medicine

## 2015-01-20 ENCOUNTER — Ambulatory Visit (INDEPENDENT_AMBULATORY_CARE_PROVIDER_SITE_OTHER): Payer: Medicare HMO | Admitting: Cardiology

## 2015-01-20 ENCOUNTER — Encounter: Payer: Self-pay | Admitting: Cardiology

## 2015-01-20 ENCOUNTER — Encounter: Payer: Self-pay | Admitting: Internal Medicine

## 2015-01-20 VITALS — BP 106/64 | HR 73 | Ht 65.0 in | Wt 158.8 lb

## 2015-01-20 DIAGNOSIS — I482 Chronic atrial fibrillation, unspecified: Secondary | ICD-10-CM

## 2015-01-20 DIAGNOSIS — Z95810 Presence of automatic (implantable) cardiac defibrillator: Secondary | ICD-10-CM

## 2015-01-20 DIAGNOSIS — N183 Chronic kidney disease, stage 3 unspecified: Secondary | ICD-10-CM

## 2015-01-20 DIAGNOSIS — J9621 Acute and chronic respiratory failure with hypoxia: Secondary | ICD-10-CM | POA: Diagnosis not present

## 2015-01-20 DIAGNOSIS — J449 Chronic obstructive pulmonary disease, unspecified: Secondary | ICD-10-CM

## 2015-01-20 DIAGNOSIS — Z7901 Long term (current) use of anticoagulants: Secondary | ICD-10-CM

## 2015-01-20 DIAGNOSIS — I5023 Acute on chronic systolic (congestive) heart failure: Secondary | ICD-10-CM | POA: Diagnosis not present

## 2015-01-20 DIAGNOSIS — I5022 Chronic systolic (congestive) heart failure: Secondary | ICD-10-CM | POA: Diagnosis not present

## 2015-01-20 DIAGNOSIS — I480 Paroxysmal atrial fibrillation: Secondary | ICD-10-CM | POA: Diagnosis not present

## 2015-01-20 DIAGNOSIS — I2510 Atherosclerotic heart disease of native coronary artery without angina pectoris: Secondary | ICD-10-CM | POA: Diagnosis not present

## 2015-01-20 LAB — CBC WITH DIFFERENTIAL/PLATELET
BASOS ABS: 0 10*3/uL (ref 0.0–0.1)
Basophils Relative: 0 % (ref 0–1)
Eosinophils Absolute: 0.3 10*3/uL (ref 0.0–0.7)
Eosinophils Relative: 3 % (ref 0–5)
HEMATOCRIT: 38.1 % — AB (ref 39.0–52.0)
HEMOGLOBIN: 12.6 g/dL — AB (ref 13.0–17.0)
LYMPHS ABS: 1.9 10*3/uL (ref 0.7–4.0)
LYMPHS PCT: 18 % (ref 12–46)
MCH: 33.2 pg (ref 26.0–34.0)
MCHC: 33.1 g/dL (ref 30.0–36.0)
MCV: 100.3 fL — AB (ref 78.0–100.0)
MPV: 10 fL (ref 8.6–12.4)
Monocytes Absolute: 0.8 10*3/uL (ref 0.1–1.0)
Monocytes Relative: 8 % (ref 3–12)
NEUTROS ABS: 7.5 10*3/uL (ref 1.7–7.7)
Neutrophils Relative %: 71 % (ref 43–77)
Platelets: 186 10*3/uL (ref 150–400)
RBC: 3.8 MIL/uL — AB (ref 4.22–5.81)
RDW: 15 % (ref 11.5–15.5)
WBC: 10.6 10*3/uL — AB (ref 4.0–10.5)

## 2015-01-20 LAB — BASIC METABOLIC PANEL
BUN: 55 mg/dL — AB (ref 7–25)
CHLORIDE: 99 mmol/L (ref 98–110)
CO2: 36 mmol/L — AB (ref 20–31)
CREATININE: 1.55 mg/dL — AB (ref 0.70–1.18)
Calcium: 9.5 mg/dL (ref 8.6–10.3)
Glucose, Bld: 103 mg/dL — ABNORMAL HIGH (ref 65–99)
POTASSIUM: 5.6 mmol/L — AB (ref 3.5–5.3)
Sodium: 141 mmol/L (ref 135–146)

## 2015-01-20 MED ORDER — DIGOXIN 62.5 MCG PO TABS: 0.0625 mg | ORAL_TABLET | Freq: Every day | ORAL | Status: DC

## 2015-01-20 NOTE — Progress Notes (Signed)
Patient ID: Troy [name], male   DOB: 02/18/1940, 74 y.o.   MRN: [id number]       This is a discharge note.  Level care skilled.  Facility [facility].  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 He presented to the ER with a gradual onset of increasing shortness of breath. He had seen cardiology at the end of November Aldactone was added to his usual dose of Lasix however this did not help. . In the hospital he was felt to have CHF as well as a mild COPD exacerbation. He comes out on 2 L of oxygen. His Lasix was increased by cardiology to 80 mg twice a day and follow-up with cardiology This subsequently has been reduced to 60 mg twice a day by Dr. Dellia Nims secondary to a rising creatinine.  Most recent creatinine was 1.76 on December 19 this is being updated by cardiology which actually saw him today... Patient was thought to be doing well they did reduce his digoxin dose.   . The patient also has chronic atrial fibrillation with a pacer/defibrillator. He is on chronic Coumadin. He was found also to have acute on chronic kidney failure baseline kidney function with a creatinine of 1.3-1.4 stage III--again most recent lab was slightly above his baseline at 1.76.  His weight continues to be stable currently at 157.2.  Nursing staff has noted occasionally some low blood pressures occasionally with systolics in the 0000000- We did decrease his Cozaar down to 50 mg a day-per cardiology note today they suggested decreasing his hydralazine to twice a day if hypotension readings persist  He is on numerous agents including Coreg 12.5 mg twice a day-hydralazine 25 mg 3 times a day-Cozaar now at 50 mg daily.  He is also on Aldactone 25 mg daily and Lasix 60 mg twice a day  He does not appear to be overtly symptomatic of hypotension with increased dizziness or syncopal-type  feelings.  Patient also has severe COPD he is oxygen dependent Dr. Dellia Nims did recently see him and made his albuterol nebulizers routine every 4 hours and when necessary every 2 this appears to be helping he also continues on Dulera--as well as Flonase. He actually appears did do quite well with this-there is no labored breathing-he appears to be significant more comfortable than apparently the way he presented initially  Regards atrial fibrillation he is on Coreg for rate control--Digoxin- as well as Coumadin for anticoagulation most recent INR 2.65 on 2 mg of Coumadin update INR has been ordered   Patient did complain of some stomach discomfort when I saw him a couple days ago but does not really complain of that today-he does state he does have some gas at times  Patient will be going back to his apartment and he-he has gained strength appears to be significantly clinically improved-he does have attentive neighbors-he also has an appointment for an updated PT/INR by the nurse on Monday, January 2.  With follow-up of primary care provider on January 9.       Labs.  01/16/2015.  INR-2.65.  01/09/2015.  Sodium 140 potassium 4.8 BUN 57 creatinine 1.76.  01/04/2015.  Digoxin 1.8.  01/02/2015.  WBC 12.2 this appears to be trending down-hemoglobin 14.9-platelets 153.  12/29/2014.  Magnesium 2.0.  12/25/2014.  Albumin 3.5-ALT 16-otherwise liver function tests within normal limits.  I do note on  December 4 a BNP was listed as 1078-however Dr. Janalyn Rouse note shows an updated BNP is significantly improved at 100 .      BMP Latest Ref Rng 12/30/2014 12/29/2014 12/28/2014  Glucose 65 - 99 mg/dL 110(H) 95 123(H)  BUN 6 - 20 mg/dL 42(H) 39(H) 40(H)  Creatinine 0.61 - 1.24 mg/dL 1.43(H) 1.42(H) 1.48(H)  Sodium 135 - 145 mmol/L 139 140 141  Potassium 3.5 - 5.1 mmol/L 4.0 4.5 4.4  Chloride 101 - 111 mmol/L 93(L) 95(L) 97(L)  CO2 22 - 32 mmol/L 38(H) 38(H) 38(H)  Calcium 8.9 -  10.3 mg/dL 9.1 8.9 9.2   CBC Latest Ref Rng 12/30/2014 12/28/2014 12/25/2014  WBC 4.0 - 10.5 K/uL 12.5(H) 15.3(H) 6.6  Hemoglobin 13.0 - 17.0 g/dL 14.7 14.3 14.9  Hematocrit 39.0 - 52.0 % 44.7 44.1 46.3  Platelets 150 - 400 K/uL 129(L) 134(L) 141(L)         CLINICAL DATA:  Shortness of breath.   EXAM: CHEST - 2 VIEW   COMPARISON:  09/26/2014   FINDINGS: The heart size and mediastinal contours are within normal limits. Stable appearance of a biventricular pacing/ ICD device. Probable chronic pulmonary venous hypertension without overt edema. Mild atelectasis at the right lung base present. No airspace consolidation, pneumothorax or pleural effusions. The visualized skeletal structures are unremarkable.   IMPRESSION: Stable chronic pulmonary venous hypertension. Right basilar atelectasis.     Electronically Signed   By: Aletta Edouard M.D.   On: 12/25/2014 08:10         CLINICAL DATA:  Shortness of breath.   EXAM: CHEST - 2 VIEW   COMPARISON:  09/26/2014   FINDINGS: The heart size and mediastinal contours are within normal limits. Stable appearance of a biventricular pacing/ ICD device. Probable chronic pulmonary venous hypertension without overt edema. Mild atelectasis at the right lung base present. No airspace consolidation, pneumothorax or pleural effusions. The visualized skeletal structures are unremarkable.   IMPRESSION: Stable chronic pulmonary venous hypertension. Right basilar atelectasis.     Electronically Signed   By: Aletta Edouard M.D.   On: 12/25/2014 08:10     Past medical history Past Medical History  Diagnosis Date  . CAD (coronary artery disease) 1996    status post PCI of the RCA   . Ischemic dilated cardiomyopathy   . HTN (hypertension)   . GERD (gastroesophageal reflux disease)   . Personal history of DVT (deep vein thrombosis)   . Diabetes mellitus, type 2 ([place])   . DJD (degenerative joint disease)   . Gout   . Prostatitis     . Nephrolithiasis   . Atrial fibrillation ([place])   . Congestive heart failure, unspecified   . Other primary cardiomyopathies   . COPD (chronic obstructive pulmonary disease) (HCC)     Pt on home O2 at night and PRN, unsure of date of diagnosis.   Past Surgical History  Procedure Laterality Date  . Back surgery    . Coronary stent placement    . Cardiac defibrillator placement    . Pacemaker insertion      Current Outpatient Prescriptions on File Prior to Visit  Medication Sig Dispense Refill  . acetaminophen (TYLENOL) 500 MG tablet Take 500 mg by mouth every 6 (six) hours as needed for mild pain or moderate pain.     Marland Kitchen albuterol (ACCUNEB) 1.25 MG/3ML nebulizer solution INHALE ONE VIAL VIA NEBULIZER EVERY 6 HOURS AS NEEDED FOR WHEEZING. 75 mL 5  . alfuzosin (UROXATRAL) 10 MG 24  hr tablet Take 10 mg by mouth daily with breakfast.     . allopurinol (ZYLOPRIM) 100 MG tablet Take 100 mg by mouth daily.    Marland Kitchen atorvastatin (LIPITOR) 10 MG tablet Take 1 tablet (10 mg total) by mouth at bedtime. 90 tablet 1  . carvedilol (COREG) 12.5 MG tablet Take 1 tablet (12.5 mg total) by mouth 2 (two) times daily with a meal. 60 tablet 1  . digoxin (LANOXIN) 0.125 MG tablet Take 1 tablet (125 mcg total) by mouth daily. 60 tablet 1  . fluticasone (FLONASE) 50 MCG/ACT nasal spray Place 2 sprays into both nostrils at bedtime.     . furosemide (LASIX) 60 MG tablet Take 1 tablet (60 mg total) by mouth 2 (two) times daily.    Marland Kitchen guaiFENesin-dextromethorphan (ROBITUSSIN DM) 100-10 MG/5ML syrup Take 5 mLs by mouth every 4 (four) hours as needed for cough. 118 mL 0  . hydrALAZINE (APRESOLINE) 25 MG tablet Take 1 tablet (25 mg total) by mouth 3 (three) times daily. 90 tablet 6  . loratadine (CLARITIN) 10 MG tablet Take 10 mg by mouth daily. For allergies    . losartan (COZAAR) 50 MG tablet Take 50 mg by mouth daily.     . mometasone-formoterol (DULERA) 100-5 MCG/ACT AERO Inhale 2 puffs into the lungs 2 (two) times  daily. 1 Inhaler 1  . omeprazole (PRILOSEC) 40 MG capsule Take 40 mg by mouth at bedtime.     . potassium chloride SA (K-DUR,KLOR-CON) 20 MEQ tablet Take 1 tablet (20 mEq total) by mouth daily. 6 tablet 0  . PROAIR HFA 108 (90 BASE) MCG/ACT inhaler INHALE 2 PUFFS EVERY 4 HOURS AS NEEDED FOR WHEEZING OR SHORTNESS OF BREATH. 8.5 g 1  . spironolactone (ALDACTONE) 25 MG tablet Take 1 tablet (25 mg total) by mouth daily. 90 tablet 3  . warfarin (COUMADIN) 1 MG tablet Take 2- mg by mouth daily with supper.      Social; patient  lived on his own in [place] uses a cane. Not on oxygen at home but has nebulizers. Independent with ADLs and IADLs  reports that he quit smoking about 34 years ago. His smoking use included Cigarettes. He has a 200 pack-year smoking history. He has never used smokeless tobacco. He reports that he does not drink alcohol or use illicit drugs.  iFam indicated that his mother is deceased. He indicated that his father is deceased. He indicated that his brother is deceased.   Review of systems Gen. patient states he feels stable per nursing doing much better than originally Skin does not complain of rashes or itching. HEENT;  Does not complain of visual changes-does have history of allergic rhinitis but apparently this has improved on the inhaler.  Respiratory is not complaining of any increased shortness of breath beyond baseline does have a somewhat chronic cough Cardiac no complaints of chest heaviness pressure sharp pain etc. GI;   Currently does not complain of abdominal pain nausea vomiting diarrhea constipation he does state he does have gas at times GU no dysuria no hematuria Musculoskeletal; no complaints of pain currently Neurologic no focal weakness states he can walk and is stable ears to have gained some strength here He is complaining of insomnia Endocrine not a diabetic; has not been on chronic prednisone. Steroids were not used in the hospital Mental  status; no depressive symptoms bright alert talkative  Physical examination    Temperature is 98.8 pulse 83 respirations 20 blood pressure 112/54 weight is 157.2.  In general this is a pleasant elderly male in no distress sitting comfortably in his wheelchair.  His skin is warm and dry HEENT oral exam is norma mucous membranes moist throat is clear l Respiratory; very poor air entry bilaterally this apparently is not new-I cannot really hear any wheezing or congestion however-there is no accessory muscle use currently  Cardiac; heart sounds are distant probably --sounded regular-- no murmurs JVP is not elevated no signs of congestive heart failure Abdomen; soft no tenderness no masses--bowel sounds are positive  Extremities; venous insufficiency and varicosities but minimal edema no signs of a DVT He is ambulatory but would benefit from a rolling walker secondary to continued weakness Neurologic; non-lateralizing. Good strength bilaterally.  Mental status; I see no abnormalities here is able to give his own history. No evidence of depression or delirium--pleasant talkative  Impression/plan #1 COPD acute; his respiratory status appears improved-he is on albuterol nebulizers routinely 4 hours. He is already on albuterol every 2 hours when necessary, Dulera 2 puffs twice a day, pro-air 2 puffs every 4 when necessary. Patient will need home health support and O2-- saturations do drop to 84% on room air  #2; history of systolic congestive heart failure.--- This appears stabilized his weights are stable he is currently on Lasix 60 mg twice a day as well as Aldactone 25 mg a day-again respiratory status appears significantly improved   #3 acute on chronic renal failure. Chronic renal failure stage III--last lab on December 19 shows a creatinine of 1.76 which appears to be on the higher end of baseline --Cardiology has updated labs for follow-up  #4 atrial fibrillation he remains on digoxin  which has been reduced today by cardiology, Coreg at 12.5 twice a day. His heart rate is controlled. He is also on Coumadin 2 mg a day-INR therapeutic at 2.65 as of December 26 update INR is pending early next week  #6-history of stomach pain-this appears transitory-does not really complain of that today t . I do note he has a history of GERD is on Prilosec  #7 insomnia--have startedmelatonin as needed at night   #8 hypotension- His Cozaar dose has been reduced to 50 mg twice a day-most recent blood pressure 112/54 which appears to be somewhat improved this will need to be watched cardiology has recommended reducing his hydralazine to twice a day if blood pressures continue to be low he is currently on 25 mg 3 times a day-is also on Coreg 12.5 mg twice a day.  In general patient appears to be significantly improve respiratory status appears stable although still somewhat fragile he is oxygen dependent as noted above.  He will need cardiology primary care follow-up as well update INR will be drawn by home health week.  He will need a rolling walker to assist with ambulation secondary to continued weakness as well as continued PT and OT-and nursing support for his medical issues and INR draws.  As well as monitoring his oxygen requirements.  [id number] note greater than 30 minutes spent on this discharge summary-greater than 50% of time spent coordinating plan of care for numerous diagnoses   e

## 2015-01-20 NOTE — Progress Notes (Signed)
Cardiology Office Note   Date:  01/20/2015   ID:  Troy [name], DOB 1940-08-12, MRN [id number]  PCP:  Barbette Merino, MD  Cardiologist:  [name] EP: Dr. Lovena Le    Chief Complaint  Patient presents with  . Hospitalization Follow-up    post ED//pt c/o SOB (COPD), occasional dizziness--feeling very weak, and coughing--pt states he cannot cough anything up//no other Sx.      History of Present Illness: Troy [name] is a 74 y.o. male who presents for post hospital   mixed CM, chronic class 2 CHF, s/p biv-ICD-PLACED 2012, chb,  and PAF. CAD, PCI of RCA in 1996 .   Last Echo 11/22/13 with EF 20-25%.   Now post hospital 12/ /16 for acute on chronic systolic HF.  Was neg 5 L with diuresis- discharged with oxygen and to SNF for rehab.  Pt lives alone otherwise.  Plan for d/c home tomorrow per pt.  He is on nebulizers for his COPD and his SOB is controlled.  No chest pain.  No edema.  He complains of weakness still.  May need home PT if truly going home.   At SNF his BP drops here stable at [id number] systolic.  Also with nausea at times,    Dig level was 1.8 so will decrease and recheck level in a few weeks.   Past Medical History  Diagnosis Date  . CAD (coronary artery disease) 1996    status post PCI of the RCA   . Ischemic dilated cardiomyopathy   . HTN (hypertension)   . GERD (gastroesophageal reflux disease)   . Personal history of DVT (deep vein thrombosis)   . Diabetes mellitus, type 2 ([place])   . DJD (degenerative joint disease)   . Gout   . Prostatitis   . Nephrolithiasis   . Atrial fibrillation ([place])   . Congestive heart failure, unspecified   . Other primary cardiomyopathies   . COPD (chronic obstructive pulmonary disease) (HCC)     Pt on home O2 at night and PRN, unsure of date of diagnosis.    Past Surgical History  Procedure Laterality Date  . Back surgery    . Coronary stent placement    . Cardiac defibrillator placement    . Pacemaker insertion        Current Outpatient Prescriptions  Medication Sig Dispense Refill  . digoxin 62.5 MCG TABS Take 0.0625 mg by mouth daily. 30 tablet 11   No current facility-administered medications for this visit.    Allergies:   Benadryl    Social History:  The patient  reports that he quit smoking about 35 years ago. His smoking use included Cigarettes. He has a 200 pack-year smoking history. He has never used smokeless tobacco. He reports that he does not drink alcohol or use illicit drugs.   Family History:  The patient's family history includes Cancer in his father; Colon cancer in his mother; Heart disease in his father; Stroke in his paternal uncle.    ROS:  General:no colds or fevers,  weight decreased from 170 to 158   Skin:no rashes or ulcers HEENT:no blurred vision, no congestion CV:see HPI PUL:see HPI GI:no diarrhea constipation or melena, no indigestion GU:no hematuria, no dysuria MS:no joint pain, no claudication Neuro:no syncope, no lightheadedness Endo:no diabetes, no thyroid disease  Wt Readings from Last 3 Encounters:  01/20/15 158 lb 12.8 oz (72.031 kg)  12/29/14 157 lb 6.4 oz (71.396 kg)  12/19/14 170 lb 3.2 oz (77.202 kg)  PHYSICAL EXAM: VS:  BP 106/64 mmHg  Pulse 73  Ht 5\' 5"  (1.651 m)  Wt 158 lb 12.8 oz (72.031 kg)  BMI 26.43 kg/m2 , BMI Body mass index is 26.43 kg/(m^2). General:Pleasant affect, NAD Skin:Warm and dry, brisk capillary refill HEENT:normocephalic, sclera clear, mucus membranes moist Neck:supple, no JVD, no bruits  Heart:S1S2 RRR without murmur, gallup, rub or click Lungs:clear without rales, rhonchi, or wheezes [id number], non tender, + BS, do not palpate liver spleen or masses Ext:no lower ext edema, 2+ pedal pulses, 2+ radial pulses Neuro:alert and oriented, MAE, follows commands, + facial symmetry    EKG:  EKG is ordered today. The ekg ordered today demonstrates a sensing v pacing with PVC no acute changes, SR   Recent  Labs: 05/02/2014: Pro B Natriuretic peptide (BNP) 1499.0* 12/25/2014: ALT 16* 12/29/2014: Magnesium 2.0 01/02/2015: Hemoglobin 14.9; Platelets 153 01/04/2015: B Natriuretic Peptide 188.0* 01/09/2015: BUN 57*; Creatinine, Ser 1.76*; Potassium 4.8; Sodium 140    Lipid Panel    Component Value Date/Time   CHOL 102 08/05/2014 0847   TRIG 84.0 08/05/2014 0847   HDL 32.80* 08/05/2014 0847   CHOLHDL 3 08/05/2014 0847   VLDL 16.8 08/05/2014 0847   LDLCALC 52 08/05/2014 0847       Other studies Reviewed: Additional studies/ records that were reviewed today include: hospital notes. .   ASSESSMENT AND PLAN:  1.  Chronic Systolic CHF: stable. No edema. euvolemic. Weight is down from 170-158 since discharge.  Supplemental O2 continues. . Still with mild diffuse expiratory wheezing.  pulmonary component contributing to his dyspnea. Continue PO Lasix. continue 80 of Lasix BID and recheck BMP, BNP today. . Continue Coreg, losartan, hydralazine, spironolactone and digoxin at lower dose. Low sodium diet.  -APP appt in 2 weeks.  2. CAD: denies CP. Continue medical therapy.   3. PAF: SR. HR controlled. On Coumadin for a/c. CHADSVASC 5 already on Coumadinn last INR 2.65 has appt for 01/31/14 in [place] office for INR.   4. Renal Insufficiency: CKD 3 SCr stable in the 1.4 range at discharge now 1.76. Continue 80 mg of lasix BID.   --bmp today. And CBC with elevated WBC last lab.   5. NSVT: in hospital,  4-5 beat run . Continue BB therapy with Coreg.   6. Nausea, with recent dig level at 1.8 will decrease dig to 0.0625 and recheck level 01/31/14 prior to taking the dig.  7. COPD still decreased breath sounds and oxygen continues.  Pt may be discharged soon so will refer to Pulmonary for outpt management.   8. Hypotensive per SNF would decrease hydralazine to twice a day if continues.   Current medicines are reviewed with the patient today.  The patient Has no concerns regarding  medicines.  The following changes have been made:  See above Labs/ tests ordered today include:see above  Disposition:   FU:  see above  Lennie Muckle, NP  01/20/2015 12:23 PM    [facility] [address], [place], [place] Phone: [phone]; Fax: [phone]

## 2015-01-20 NOTE — Patient Instructions (Addendum)
Medication Instructions:  1. DECREASE LANOXIN TO 0.0625 MG DAILY  Labwork: 1. TODAY BMET, BNP, CBC W/DIFF  2. DIGOXIN LEVEL TO BE DONE 02/01/15 SAME DAY WHEN YOU HAVE YOUR COUMADIN LEVEL CHECKED   Testing/Procedures: NONE  Follow-Up: 1. A REFERRAL TO PULMONOLOGY DX COPD  2. LAURA INGOLD, PAC IN 2-3 WEEKS  Any Other Special Instructions Will Be Listed Below (If Applicable).     If you need a refill on your cardiac medications before your next appointment, please call your pharmacy.

## 2015-01-21 LAB — BRAIN NATRIURETIC PEPTIDE: Brain Natriuretic Peptide: 144.7 pg/mL — ABNORMAL HIGH (ref 0.0–100.0)

## 2015-01-23 DIAGNOSIS — I482 Chronic atrial fibrillation: Secondary | ICD-10-CM | POA: Diagnosis not present

## 2015-01-23 DIAGNOSIS — I5022 Chronic systolic (congestive) heart failure: Secondary | ICD-10-CM | POA: Diagnosis not present

## 2015-01-23 DIAGNOSIS — J441 Chronic obstructive pulmonary disease with (acute) exacerbation: Secondary | ICD-10-CM | POA: Diagnosis not present

## 2015-01-23 DIAGNOSIS — I2510 Atherosclerotic heart disease of native coronary artery without angina pectoris: Secondary | ICD-10-CM | POA: Diagnosis not present

## 2015-01-24 ENCOUNTER — Telehealth: Payer: Self-pay | Admitting: Interventional Cardiology

## 2015-01-24 ENCOUNTER — Telehealth: Payer: Self-pay | Admitting: *Deleted

## 2015-01-24 ENCOUNTER — Ambulatory Visit (INDEPENDENT_AMBULATORY_CARE_PROVIDER_SITE_OTHER): Payer: Medicare HMO | Admitting: *Deleted

## 2015-01-24 ENCOUNTER — Telehealth: Payer: Self-pay | Admitting: Physician Assistant

## 2015-01-24 ENCOUNTER — Other Ambulatory Visit: Payer: Self-pay | Admitting: Interventional Cardiology

## 2015-01-24 DIAGNOSIS — I4891 Unspecified atrial fibrillation: Secondary | ICD-10-CM

## 2015-01-24 DIAGNOSIS — I2510 Atherosclerotic heart disease of native coronary artery without angina pectoris: Secondary | ICD-10-CM

## 2015-01-24 DIAGNOSIS — Z5181 Encounter for therapeutic drug level monitoring: Secondary | ICD-10-CM

## 2015-01-24 LAB — BASIC METABOLIC PANEL
BUN: 54 mg/dL — ABNORMAL HIGH (ref 7–25)
CALCIUM: 9.2 mg/dL (ref 8.6–10.3)
CO2: 33 mmol/L — ABNORMAL HIGH (ref 20–31)
Chloride: 98 mmol/L (ref 98–110)
Creat: 1.59 mg/dL — ABNORMAL HIGH (ref 0.70–1.18)
Glucose, Bld: 154 mg/dL — ABNORMAL HIGH (ref 65–99)
POTASSIUM: 4.6 mmol/L (ref 3.5–5.3)
SODIUM: 139 mmol/L (ref 135–146)

## 2015-01-24 LAB — POCT INR: INR: 1.7

## 2015-01-24 MED ORDER — WARFARIN SODIUM 1 MG PO TABS: 2.0000 mg | ORAL_TABLET | Freq: Every day | ORAL | Status: DC

## 2015-01-24 NOTE — Telephone Encounter (Signed)
-----   Message from Isaiah Serge, NP sent at 01/24/2015  9:40 AM EST ----- Hold K+ and recheck BMP stat today

## 2015-01-24 NOTE — Telephone Encounter (Signed)
DAT lab results CO2 33, glucose 154, BUN 54, serum creatinine 1.59.  These results were stable from December 30.  Mirko Tailor PA-C

## 2015-01-24 NOTE — Telephone Encounter (Signed)
Called pt, per Cecilie Kicks, NP, to let him know that we needed him to hold the K+ and recheck is BMP today STAT.  Per pt, he has already taken his K+ this morning.  I called Cecilie Kicks, NP, and she advised that we still needed to recheck his levels today.  Pt has been advised of this and stated that he didn't have his car, but he would try to find a ride.  Pt has been advised of how important this and verbalized understanding.  Order has been put into EPIC.

## 2015-01-24 NOTE — Telephone Encounter (Signed)
New message       *STAT* If patient is at the pharmacy, call can be transferred to refill team.   1. Which medications need to be refilled? (please list name of each medication and dose if known)  coumadin 1mg   2. Which pharmacy/location (including street and city if local pharmacy) is medication to be sent to?  [email or web address]   3. Do they need a 30 day or 90 day supply? 30day

## 2015-01-25 ENCOUNTER — Telehealth: Payer: Self-pay | Admitting: *Deleted

## 2015-01-25 NOTE — Telephone Encounter (Signed)
Spoke with pt and advised him of his labs.  Pt verbalized understanding

## 2015-01-25 NOTE — Telephone Encounter (Signed)
-----   Message from Isaiah Serge, NP sent at 01/24/2015  5:41 PM EST ----- Labs stable continue medications as before elevated K+

## 2015-02-02 ENCOUNTER — Ambulatory Visit (INDEPENDENT_AMBULATORY_CARE_PROVIDER_SITE_OTHER): Payer: Medicare HMO | Admitting: Cardiology

## 2015-02-02 ENCOUNTER — Ambulatory Visit: Payer: Medicare HMO | Admitting: Acute Care

## 2015-02-02 DIAGNOSIS — I4891 Unspecified atrial fibrillation: Secondary | ICD-10-CM

## 2015-02-02 DIAGNOSIS — Z5181 Encounter for therapeutic drug level monitoring: Secondary | ICD-10-CM

## 2015-02-02 LAB — POCT INR: INR: 1.7

## 2015-02-02 NOTE — Progress Notes (Signed)
Cardiology Office Note   Date:  02/03/2015   ID:  CAITLIN SYMES, DOB 1940/10/11, MRN [id number]  PCP:  Barbette Merino, MD  Cardiologist:  [name]     No chief complaint on file.     History of Present Illness: Troy [name] is a 75 y.o. male who presents for chronic systolic HF and decreased appetite on higher dose of dig.    He has a mixed CM, chronic class 2 CHF, s/p biv-ICD-PLACED 2012, chb, and PAF. CAD, PCI of RCA in 1996 .   Last Echo 11/22/13 with EF 20-25%.   Recent  hospitalization 12/ /16 for acute on chronic systolic HF. Was neg 5 L with diuresis- discharged with oxygen and to SNF for rehab. He is now back home and doing well.  Pt lives alone.  He is on nebulizers for his COPD and his SOB is controlled. No chest pain. No edema. His energy has improved a great deal.  His appetite has improved with lower dose of dig.  The level was 1.8 so I decreased.  Nausea resolved.    Past Medical History  Diagnosis Date  . CAD (coronary artery disease) 1996    status post PCI of the RCA   . Ischemic dilated cardiomyopathy   . HTN (hypertension)   . GERD (gastroesophageal reflux disease)   . Personal history of DVT (deep vein thrombosis)   . Diabetes mellitus, type 2 ([place])   . DJD (degenerative joint disease)   . Gout   . Prostatitis   . Nephrolithiasis   . Atrial fibrillation ([place])   . Congestive heart failure, unspecified   . Other primary cardiomyopathies   . COPD (chronic obstructive pulmonary disease) (HCC)     Pt on home O2 at night and PRN, unsure of date of diagnosis.    Past Surgical History  Procedure Laterality Date  . Back surgery    . Coronary stent placement    . Cardiac defibrillator placement    . Pacemaker insertion       Current Outpatient Prescriptions  Medication Sig Dispense Refill  . acetaminophen (TYLENOL) 500 MG tablet Take 500 mg by mouth every 6 (six) hours as needed for mild pain or moderate pain.     Marland Kitchen albuterol  (ACCUNEB) 1.25 MG/3ML nebulizer solution INHALE ONE VIAL VIA NEBULIZER EVERY 6 HOURS AS NEEDED FOR WHEEZING. 75 mL 5  . alfuzosin (UROXATRAL) 10 MG 24 hr tablet Take 10 mg by mouth daily with breakfast.     . allopurinol (ZYLOPRIM) 100 MG tablet Take 100 mg by mouth daily.    Marland Kitchen atorvastatin (LIPITOR) 10 MG tablet Take 1 tablet (10 mg total) by mouth at bedtime. 90 tablet 1  . carvedilol (COREG) 12.5 MG tablet Take 1 tablet (12.5 mg total) by mouth 2 (two) times daily with a meal. 60 tablet 1  . digoxin 62.5 MCG TABS Take 0.0625 mg by mouth daily. 30 tablet 11  . fluticasone (FLONASE) 50 MCG/ACT nasal spray Place 2 sprays into both nostrils at bedtime.     . furosemide (LASIX) 80 MG tablet Take 1 tablet (80 mg total) by mouth 2 (two) times daily. (Patient taking differently: Take 60 mg by mouth 2 (two) times daily. )    . guaiFENesin-dextromethorphan (ROBITUSSIN DM) 100-10 MG/5ML syrup Take 5 mLs by mouth every 4 (four) hours as needed for cough. 118 mL 0  . hydrALAZINE (APRESOLINE) 25 MG tablet Take 1 tablet (25 mg total) by mouth 3 (three)  times daily. 90 tablet 6  . loratadine (CLARITIN) 10 MG tablet Take 10 mg by mouth daily. For allergies    . losartan (COZAAR) 100 MG tablet Take 100 mg by mouth daily.     . mometasone-formoterol (DULERA) 100-5 MCG/ACT AERO Inhale 2 puffs into the lungs 2 (two) times daily. 1 Inhaler 1  . omeprazole (PRILOSEC) 40 MG capsule Take 40 mg by mouth at bedtime.     . potassium chloride SA (K-DUR,KLOR-CON) 20 MEQ tablet Take 1 tablet (20 mEq total) by mouth daily. 6 tablet 0  . PROAIR HFA 108 (90 BASE) MCG/ACT inhaler INHALE 2 PUFFS EVERY 4 HOURS AS NEEDED FOR WHEEZING OR SHORTNESS OF BREATH. 8.5 g 1  . spironolactone (ALDACTONE) 25 MG tablet Take 1 tablet (25 mg total) by mouth daily. 90 tablet 3  . warfarin (COUMADIN) 1 MG tablet Take 2-3 tablets (2-3 mg total) by mouth daily. As directed by [facility] 70 tablet 1   No current facility-administered medications  for this visit.    Allergies:   Benadryl    Social History:  The patient  reports that he quit smoking about 35 years ago. His smoking use included Cigarettes. He has a 200 pack-year smoking history. He has never used smokeless tobacco. He reports that he does not drink alcohol or use illicit drugs.    ROS:  General:no colds or fevers, mild weight increase but several layers of clothing. Skin:no rashes or ulcers CV:see HPI PUL:see HPI GI:no diarrhea constipation or melena, no indigestion GU:no hematuria, no dysuria Neuro:no syncope, occ lightheadedness if he moves quickly Endo:no diabetes, no thyroid disease  Wt Readings from Last 3 Encounters:  02/03/15 160 lb (72.576 kg)  01/20/15 158 lb 12.8 oz (72.031 kg)  12/29/14 157 lb 6.4 oz (71.396 kg)     PHYSICAL EXAM: VS:  BP 100/50 mmHg  Pulse 60  Ht 5\' 5"  (1.651 m)  Wt 160 lb (72.576 kg)  BMI 26.63 kg/m2 , BMI Body mass index is 26.63 kg/(m^2). General:Pleasant affect, NAD Skin:Warm and dry, brisk capillary refill HEENT:normocephalic, sclera clear, mucus membranes moist Neck:supple, no JVD, no bruits  Heart:S1S2 RRR without murmur, gallup, rub or click Lungs:clear to mildly diminished without rales, rhonchi, or wheezes [id number], non tender, + BS, do not palpate liver spleen or masses Ext:no lower ext edema, 2+ pedal pulses, 2+ radial pulses Neuro:alert and oriented X 3, MAE, follows commands, + facial symmetry- walks with a cane.    EKG:  EKG is NOT ordered today.    Recent Labs: 05/02/2014: Pro B Natriuretic peptide (BNP) 1499.0* 12/25/2014: ALT 16* 12/29/2014: Magnesium 2.0 01/04/2015: B Natriuretic Peptide 188.0* 01/20/2015: Hemoglobin 12.6*; Platelets 186 01/24/2015: BUN 54*; Creat 1.59*; Potassium 4.6; Sodium 139    Lipid Panel    Component Value Date/Time   CHOL 102 08/05/2014 0847   TRIG 84.0 08/05/2014 0847   HDL 32.80* 08/05/2014 0847   CHOLHDL 3 08/05/2014 0847   VLDL 16.8 08/05/2014 0847   LDLCALC 52  08/05/2014 0847       Other studies Reviewed: Additional studies/ records that were reviewed today include:.  Previous notes.   ASSESSMENT AND PLAN:  1. Chronic Systolic CHF: stable. No edema. euvolemic. Weight is down from 170-160 since discharge.  Continue PO Lasix. continue 80 of Lasix BID and recheck BMP, BNP today. . Continue Coreg, losartan, hydralazine, spironolactone and digoxin at lower dose. Low sodium diet.  -  2. CAD: denies CP. Continue medical therapy.   3. PAF: SR.  HR controlled. On Coumadin for a/c. CHADSVASC 5 already on Coumadinn last INR 1.7 . He will stop by coumadin clinic for instructions.   4. Renal Insufficiency: CKD 3 SCr stable in the 1.4 range last post hospital check 1.59. Continue 80 mg of lasix BID.   5. NSVT: in hospital, 4-5 beat run . Continue BB therapy with Coreg.   6. Nausea, with recent dig level at 1.8 will decrease dig to 0.0625 --nausea resolved.  Appetite has picked up.  7. COPD still decreased breath sounds off oxygen and no SOB.Marland Kitchen referred to Pulmonary for outpt management but appt cancelled.  Pt to follow with PCP and he was seen yesterday.     Current medicines are reviewed with the patient today.  The patient Has no concerns regarding medicines.  The following changes have been made:  See above Labs/ tests ordered today include:see above  Disposition:   FU:  see above  Signed, Isaiah Serge, NP  02/03/2015 11:15 AM    [facility], [place], [place], [place] Phone: [phone]; Fax: [phone]

## 2015-02-03 ENCOUNTER — Ambulatory Visit (INDEPENDENT_AMBULATORY_CARE_PROVIDER_SITE_OTHER): Payer: Medicare HMO | Admitting: Cardiology

## 2015-02-03 ENCOUNTER — Encounter: Payer: Self-pay | Admitting: Cardiology

## 2015-02-03 VITALS — BP 100/50 | HR 60 | Ht 65.0 in | Wt 160.0 lb

## 2015-02-03 DIAGNOSIS — J449 Chronic obstructive pulmonary disease, unspecified: Secondary | ICD-10-CM | POA: Diagnosis not present

## 2015-02-03 DIAGNOSIS — I5022 Chronic systolic (congestive) heart failure: Secondary | ICD-10-CM

## 2015-02-03 DIAGNOSIS — N183 Chronic kidney disease, stage 3 unspecified: Secondary | ICD-10-CM

## 2015-02-03 DIAGNOSIS — I480 Paroxysmal atrial fibrillation: Secondary | ICD-10-CM | POA: Diagnosis not present

## 2015-02-03 DIAGNOSIS — I2510 Atherosclerotic heart disease of native coronary artery without angina pectoris: Secondary | ICD-10-CM

## 2015-02-03 DIAGNOSIS — Z7901 Long term (current) use of anticoagulants: Secondary | ICD-10-CM

## 2015-02-03 NOTE — Patient Instructions (Signed)
Medication Instructions:  Your physician recommends that you continue on your current medications as directed. Please refer to the Current Medication list given to you today.   Labwork: None ordered  Testing/Procedures: None ordered  Follow-Up: Your physician recommends that you [facility] DR. VARANASI   Any Other Special Instructions Will Be Listed Below (If Applicable).     If you need a refill on your cardiac medications before your next appointment, please call your pharmacy.

## 2015-02-08 ENCOUNTER — Telehealth: Payer: Self-pay | Admitting: *Deleted

## 2015-02-08 NOTE — Telephone Encounter (Signed)
Patient called and requested a refill on losartan. He stated that he takes 50mg  and has always taken this dose. His chart indicates that he is on 100mg , but he is not sure why. Looks like it was put on his medication list in March of 2016. He is currently out of this medication. Please advise. Thanks, MI

## 2015-02-09 ENCOUNTER — Other Ambulatory Visit: Payer: Self-pay | Admitting: *Deleted

## 2015-02-09 MED ORDER — LOSARTAN POTASSIUM 50 MG PO TABS: 50.0000 mg | ORAL_TABLET | Freq: Every day | ORAL | Status: DC

## 2015-02-09 NOTE — Telephone Encounter (Signed)
Rx sent in

## 2015-02-09 NOTE — Telephone Encounter (Signed)
Ok per Dr Irish Lack to refill Losartan 50 mg 1 tab daily. Thanks.

## 2015-02-18 ENCOUNTER — Other Ambulatory Visit: Payer: Self-pay | Admitting: Physician Assistant

## 2015-02-27 ENCOUNTER — Ambulatory Visit (INDEPENDENT_AMBULATORY_CARE_PROVIDER_SITE_OTHER): Payer: Medicare HMO

## 2015-02-27 DIAGNOSIS — I82409 Acute embolism and thrombosis of unspecified deep veins of unspecified lower extremity: Secondary | ICD-10-CM | POA: Diagnosis not present

## 2015-02-27 DIAGNOSIS — I480 Paroxysmal atrial fibrillation: Secondary | ICD-10-CM

## 2015-02-27 DIAGNOSIS — Z5181 Encounter for therapeutic drug level monitoring: Secondary | ICD-10-CM | POA: Diagnosis not present

## 2015-02-27 DIAGNOSIS — I4891 Unspecified atrial fibrillation: Secondary | ICD-10-CM | POA: Diagnosis not present

## 2015-02-27 LAB — POCT INR: INR: 2.6

## 2015-03-03 ENCOUNTER — Encounter: Payer: Self-pay | Admitting: Interventional Cardiology

## 2015-03-03 ENCOUNTER — Ambulatory Visit (INDEPENDENT_AMBULATORY_CARE_PROVIDER_SITE_OTHER): Payer: Medicare HMO | Admitting: Interventional Cardiology

## 2015-03-03 VITALS — BP 122/50 | HR 64 | Ht 65.0 in | Wt 160.0 lb

## 2015-03-03 DIAGNOSIS — N183 Chronic kidney disease, stage 3 unspecified: Secondary | ICD-10-CM

## 2015-03-03 DIAGNOSIS — I480 Paroxysmal atrial fibrillation: Secondary | ICD-10-CM | POA: Diagnosis not present

## 2015-03-03 DIAGNOSIS — I5022 Chronic systolic (congestive) heart failure: Secondary | ICD-10-CM | POA: Diagnosis not present

## 2015-03-03 DIAGNOSIS — I2510 Atherosclerotic heart disease of native coronary artery without angina pectoris: Secondary | ICD-10-CM | POA: Diagnosis not present

## 2015-03-03 MED ORDER — FUROSEMIDE 40 MG PO TABS: ORAL_TABLET | ORAL | Status: DC

## 2015-03-03 MED ORDER — CARVEDILOL 12.5 MG PO TABS: 12.5000 mg | ORAL_TABLET | Freq: Two times a day (BID) | ORAL | Status: DC

## 2015-03-03 NOTE — Patient Instructions (Signed)
Medication Instructions:  Same-no changes  Labwork: None  Testing/Procedures: None  Follow-Up: Your physician wants you to follow-up in: 6 months. You will receive a reminder letter in the mail two months in advance. If you don't receive a letter, please call our office to schedule the follow-up appointment.      If you need a refill on your cardiac medications before your next appointment, please call your pharmacy.   

## 2015-03-03 NOTE — Progress Notes (Signed)
Patient ID: Troy [name], male   DOB: 03-Feb-1940, 75 y.o.   MRN: [id number]     Cardiology Office Note   Date:  03/03/2015   ID:  Troy [name], DOB Mar 21, 1940, MRN [id number]  PCP:  Barbette Merino, MD    No chief complaint on file.    Wt Readings from Last 3 Encounters:  03/03/15 160 lb (72.576 kg)  02/03/15 160 lb (72.576 kg)  01/20/15 158 lb 12.8 oz (72.031 kg)       History of Present Illness: Troy [name] is a 75 y.o. male  who presents for chronic systolic HF.   He has a mixed CM, chronic class 2 CHF, s/p biv-ICD-PLACED 2012, chb, and PAF. CAD, PCI of RCA in 1996 .   Last Echo 11/22/13 with EF 20-25%.   Most Recent hospitalization 12/16 for acute on chronic systolic HF. Was neg 5 L with diuresis- discharged with oxygen and to SNF for rehab. He is now back home and doing well.  He feels he gained more strength form being at home than going to rehab. Pt lives alone. He is on nebulizers for his COPD and his SOB is controlled. No chest pain. No edema. His energy has improved a great deal.   His appetite has improved with lower dose of dig, but still not what it used to be. The level was 1.8 so I decreased. Nausea resolved.  Overall, he feels that he is better.      Past Medical History  Diagnosis Date  . CAD (coronary artery disease) 1996    status post PCI of the RCA   . Ischemic dilated cardiomyopathy   . HTN (hypertension)   . GERD (gastroesophageal reflux disease)   . Personal history of DVT (deep vein thrombosis)   . Diabetes mellitus, type 2 ([place])   . DJD (degenerative joint disease)   . Gout   . Prostatitis   . Nephrolithiasis   . Atrial fibrillation ([place])   . Congestive heart failure, unspecified   . Other primary cardiomyopathies   . COPD (chronic obstructive pulmonary disease) (HCC)     Pt on home O2 at night and PRN, unsure of date of diagnosis.    Past Surgical History  Procedure Laterality Date  . Back surgery    .  Coronary stent placement    . Cardiac defibrillator placement    . Pacemaker insertion       Current Outpatient Prescriptions  Medication Sig Dispense Refill  . acetaminophen (TYLENOL) 500 MG tablet Take 500 mg by mouth every 6 (six) hours as needed for mild pain or moderate pain.     Marland Kitchen albuterol (ACCUNEB) 1.25 MG/3ML nebulizer solution INHALE ONE VIAL VIA NEBULIZER EVERY 6 HOURS AS NEEDED FOR WHEEZING. 75 mL 5  . alfuzosin (UROXATRAL) 10 MG 24 hr tablet Take 10 mg by mouth daily with breakfast.     . allopurinol (ZYLOPRIM) 100 MG tablet Take 100 mg by mouth daily.    Marland Kitchen atorvastatin (LIPITOR) 10 MG tablet Take 1 tablet (10 mg total) by mouth at bedtime. 90 tablet 1  . carvedilol (COREG) 12.5 MG tablet Take 1 tablet (12.5 mg total) by mouth 2 (two) times daily with a meal. 60 tablet 1  . digoxin 62.5 MCG TABS Take 0.0625 mg by mouth daily. 30 tablet 11  . fluticasone (FLONASE) 50 MCG/ACT nasal spray Place 2 sprays into both nostrils at bedtime.     . furosemide (LASIX) 40 MG tablet Take 40 mg  by mouth 2 (two) times daily.    . furosemide (LASIX) 80 MG tablet Take 1 tablet (80 mg total) by mouth 2 (two) times daily. (Patient taking differently: Take 60 mg by mouth 2 (two) times daily. )    . guaiFENesin-dextromethorphan (ROBITUSSIN DM) 100-10 MG/5ML syrup Take 5 mLs by mouth every 4 (four) hours as needed for cough. 118 mL 0  . hydrALAZINE (APRESOLINE) 25 MG tablet TAKE ONE TABLET BY MOUTH THREE TIMES DAILY. 90 tablet 11  . loratadine (CLARITIN) 10 MG tablet Take 10 mg by mouth daily. For allergies    . losartan (COZAAR) 50 MG tablet Take 1 tablet (50 mg total) by mouth daily. 90 tablet 3  . mometasone-formoterol (DULERA) 100-5 MCG/ACT AERO Inhale 2 puffs into the lungs 2 (two) times daily. 1 Inhaler 1  . omeprazole (PRILOSEC) 40 MG capsule Take 40 mg by mouth at bedtime.     . potassium chloride SA (K-DUR,KLOR-CON) 20 MEQ tablet Take 1 tablet (20 mEq total) by mouth daily. 6 tablet 0  .  PROAIR HFA 108 (90 BASE) MCG/ACT inhaler INHALE 2 PUFFS EVERY 4 HOURS AS NEEDED FOR WHEEZING OR SHORTNESS OF BREATH. 8.5 g 1  . spironolactone (ALDACTONE) 25 MG tablet Take 1 tablet (25 mg total) by mouth daily. 90 tablet 3  . warfarin (COUMADIN) 1 MG tablet Take 2-3 tablets (2-3 mg total) by mouth daily. As directed by [facility] 70 tablet 1   No current facility-administered medications for this visit.    Allergies:   Benadryl    Social History:  The patient  reports that he quit smoking about 35 years ago. His smoking use included Cigarettes. He has a 200 pack-year smoking history. He has never used smokeless tobacco. He reports that he does not drink alcohol or use illicit drugs.   Family History:  The patient's family history includes Cancer in his father; Colon cancer in his mother; Heart disease in his father; Stroke in his paternal uncle. There is no history of Heart attack, Hyperlipidemia, or Hypertension.    ROS:  Please see the history of present illness.   Otherwise, review of systems are positive for runny nose, cold sx.   All other systems are reviewed and negative.    PHYSICAL EXAM: VS:  BP 122/50 mmHg  Pulse 64  Ht 5\' 5"  (1.651 m)  Wt 160 lb (72.576 kg)  BMI 26.63 kg/m2 , BMI Body mass index is 26.63 kg/(m^2). GEN: Well nourished, well developed, in no acute distress HEENT: normal Neck: no JVD, carotid bruits, or masses Cardiac: RRR; no murmurs, rubs, or gallops,no edema  Respiratory:  Mild wheezoing to auscultation bilaterally, normal work of breathing GI: soft, nontender, nondistended, + BS MS: no deformity or atrophy Skin: warm and dry, no rash Neuro:  Strength and sensation are intact Psych: euthymic mood, full affect     Recent Labs: 05/02/2014: Pro B Natriuretic peptide (BNP) 1499.0* 12/25/2014: ALT 16* 12/29/2014: Magnesium 2.0 01/04/2015: B Natriuretic Peptide 188.0* 01/20/2015: Hemoglobin 12.6*; Platelets 186 01/24/2015: BUN 54*; Creat 1.59*;  Potassium 4.6; Sodium 139   Lipid Panel    Component Value Date/Time   CHOL 102 08/05/2014 0847   TRIG 84.0 08/05/2014 0847   HDL 32.80* 08/05/2014 0847   CHOLHDL 3 08/05/2014 0847   VLDL 16.8 08/05/2014 0847   LDLCALC 52 08/05/2014 0847     Other studies Reviewed: Additional studies/ records that were reviewed today with results demonstrating:  Most recent ejection fraction by echo in the  20-25% range.   ASSESSMENT AND PLAN:  1. Chronic systolic heart failure:  Appears euvolemic. Continue current dose of diuretics.   Stressed the importance of daily weights. 2. CAD:  Status post PCI of RCA many years ago. No anginal symptoms at this time. Continue aggressive secondary prevention. Lipids well controlled. 3. CRI:  Stable. Avoid nephrotoxins. This likely to be followed closely. 4. AFib:   Sometimes difficult to tell by ECG since he has a paced rhythm. There is underlying atrial fibrillation. No bleeding issues with his current anticoagulation.   Current medicines are reviewed at length with the patient today.  The patient concerns regarding his medicines were addressed.  The following changes have been made:  No change  Labs/ tests ordered today include:  No orders of the defined types were placed in this encounter.    Recommend 150 minutes/week of aerobic exercise Low fat, low carb, high fiber diet recommended  Disposition:   FU in 6 months   Teresita Madura., MD  03/03/2015 1:57 PM    [facility] [place], [place], [place]  [address] Phone: [phone]; Fax: [phone]

## 2015-03-04 ENCOUNTER — Emergency Department (HOSPITAL_COMMUNITY)
Admission: EM | Admit: 2015-03-04 | Discharge: 2015-03-04 | Disposition: A | Discharge status: Home / Self Care | Payer: Medicare HMO | Source: Home / Self Care | Attending: Emergency Medicine | Admitting: Emergency Medicine

## 2015-03-04 ENCOUNTER — Encounter (HOSPITAL_COMMUNITY): Payer: Self-pay | Admitting: Emergency Medicine

## 2015-03-04 ENCOUNTER — Emergency Department (HOSPITAL_COMMUNITY): Payer: Medicare HMO

## 2015-03-04 DIAGNOSIS — M109 Gout, unspecified: Secondary | ICD-10-CM | POA: Insufficient documentation

## 2015-03-04 DIAGNOSIS — IMO0001 Reserved for inherently not codable concepts without codable children: Secondary | ICD-10-CM

## 2015-03-04 DIAGNOSIS — B974 Respiratory syncytial virus as the cause of diseases classified elsewhere: Secondary | ICD-10-CM | POA: Diagnosis present

## 2015-03-04 DIAGNOSIS — I509 Heart failure, unspecified: Secondary | ICD-10-CM

## 2015-03-04 DIAGNOSIS — Z7901 Long term (current) use of anticoagulants: Secondary | ICD-10-CM | POA: Insufficient documentation

## 2015-03-04 DIAGNOSIS — Z9981 Dependence on supplemental oxygen: Secondary | ICD-10-CM | POA: Insufficient documentation

## 2015-03-04 DIAGNOSIS — K219 Gastro-esophageal reflux disease without esophagitis: Secondary | ICD-10-CM

## 2015-03-04 DIAGNOSIS — Z79899 Other long term (current) drug therapy: Secondary | ICD-10-CM

## 2015-03-04 DIAGNOSIS — Z87891 Personal history of nicotine dependence: Secondary | ICD-10-CM

## 2015-03-04 DIAGNOSIS — Z7951 Long term (current) use of inhaled steroids: Secondary | ICD-10-CM

## 2015-03-04 DIAGNOSIS — I480 Paroxysmal atrial fibrillation: Secondary | ICD-10-CM | POA: Diagnosis present

## 2015-03-04 DIAGNOSIS — N289 Disorder of kidney and ureter, unspecified: Secondary | ICD-10-CM | POA: Diagnosis not present

## 2015-03-04 DIAGNOSIS — I10 Essential (primary) hypertension: Secondary | ICD-10-CM | POA: Insufficient documentation

## 2015-03-04 DIAGNOSIS — I5022 Chronic systolic (congestive) heart failure: Secondary | ICD-10-CM | POA: Diagnosis present

## 2015-03-04 DIAGNOSIS — I255 Ischemic cardiomyopathy: Secondary | ICD-10-CM | POA: Diagnosis present

## 2015-03-04 DIAGNOSIS — Z86718 Personal history of other venous thrombosis and embolism: Secondary | ICD-10-CM | POA: Insufficient documentation

## 2015-03-04 DIAGNOSIS — Z66 Do not resuscitate: Secondary | ICD-10-CM | POA: Diagnosis present

## 2015-03-04 DIAGNOSIS — J9601 Acute respiratory failure with hypoxia: Secondary | ICD-10-CM | POA: Diagnosis not present

## 2015-03-04 DIAGNOSIS — Z7982 Long term (current) use of aspirin: Secondary | ICD-10-CM

## 2015-03-04 DIAGNOSIS — I4891 Unspecified atrial fibrillation: Secondary | ICD-10-CM

## 2015-03-04 DIAGNOSIS — I130 Hypertensive heart and chronic kidney disease with heart failure and stage 1 through stage 4 chronic kidney disease, or unspecified chronic kidney disease: Secondary | ICD-10-CM | POA: Diagnosis present

## 2015-03-04 DIAGNOSIS — Z87442 Personal history of urinary calculi: Secondary | ICD-10-CM

## 2015-03-04 DIAGNOSIS — I2510 Atherosclerotic heart disease of native coronary artery without angina pectoris: Secondary | ICD-10-CM | POA: Diagnosis present

## 2015-03-04 DIAGNOSIS — Z95810 Presence of automatic (implantable) cardiac defibrillator: Secondary | ICD-10-CM

## 2015-03-04 DIAGNOSIS — J440 Chronic obstructive pulmonary disease with acute lower respiratory infection: Secondary | ICD-10-CM | POA: Insufficient documentation

## 2015-03-04 DIAGNOSIS — E119 Type 2 diabetes mellitus without complications: Secondary | ICD-10-CM | POA: Insufficient documentation

## 2015-03-04 DIAGNOSIS — I420 Dilated cardiomyopathy: Secondary | ICD-10-CM | POA: Diagnosis present

## 2015-03-04 DIAGNOSIS — J441 Chronic obstructive pulmonary disease with (acute) exacerbation: Secondary | ICD-10-CM | POA: Diagnosis not present

## 2015-03-04 DIAGNOSIS — Z87438 Personal history of other diseases of male genital organs: Secondary | ICD-10-CM | POA: Insufficient documentation

## 2015-03-04 DIAGNOSIS — Z7952 Long term (current) use of systemic steroids: Secondary | ICD-10-CM

## 2015-03-04 DIAGNOSIS — N400 Enlarged prostate without lower urinary tract symptoms: Secondary | ICD-10-CM | POA: Diagnosis present

## 2015-03-04 DIAGNOSIS — N183 Chronic kidney disease, stage 3 (moderate): Secondary | ICD-10-CM | POA: Diagnosis present

## 2015-03-04 DIAGNOSIS — N179 Acute kidney failure, unspecified: Secondary | ICD-10-CM | POA: Diagnosis present

## 2015-03-04 DIAGNOSIS — Z955 Presence of coronary angioplasty implant and graft: Secondary | ICD-10-CM

## 2015-03-04 DIAGNOSIS — E1122 Type 2 diabetes mellitus with diabetic chronic kidney disease: Secondary | ICD-10-CM | POA: Diagnosis present

## 2015-03-04 MED ORDER — PREDNISONE 20 MG PO TABS: 40.0000 mg | ORAL_TABLET | Freq: Once | ORAL | Status: DC

## 2015-03-04 MED ORDER — ALBUTEROL SULFATE (2.5 MG/3ML) 0.083% IN NEBU
2.5000 mg | INHALATION_SOLUTION | Freq: Once | RESPIRATORY_TRACT | Status: AC
  Administered 2015-03-04: 2.5 mg via RESPIRATORY_TRACT
  Filled 2015-03-04: qty 3

## 2015-03-04 MED ORDER — PREDNISONE 20 MG PO TABS: 40.0000 mg | ORAL_TABLET | Freq: Every day | ORAL | Status: DC

## 2015-03-04 MED ORDER — IPRATROPIUM-ALBUTEROL 0.5-2.5 (3) MG/3ML IN SOLN
3.0000 mL | Freq: Once | RESPIRATORY_TRACT | Status: AC
  Administered 2015-03-04: 3 mL via RESPIRATORY_TRACT
  Filled 2015-03-04: qty 3

## 2015-03-04 MED ORDER — PREDNISONE 50 MG PO TABS
60.0000 mg | ORAL_TABLET | Freq: Once | ORAL | Status: AC
  Administered 2015-03-04: 60 mg via ORAL
  Filled 2015-03-04: qty 1

## 2015-03-04 MED ORDER — DOXYCYCLINE HYCLATE 100 MG PO CAPS: 100.0000 mg | ORAL_CAPSULE | Freq: Two times a day (BID) | ORAL | Status: DC

## 2015-03-04 NOTE — Discharge Instructions (Signed)
Chronic Bronchitis Chronic bronchitis is a lasting inflammation of the bronchial tubes, which are the tubes that carry air into your lungs. This is inflammation that occurs:   On most days of the week.   For at least three months at a time.   Over a period of two years in a row. When the bronchial tubes are inflamed, they start to produce mucus. The inflammation and buildup of mucus make it more difficult to breathe. Chronic bronchitis is usually a permanent problem and is one type of chronic obstructive pulmonary disease (COPD). People with chronic bronchitis are at greater risk for getting repeated colds, or respiratory infections. CAUSES  Chronic bronchitis most often occurs in people who have:  Long-standing, severe asthma.  A history of smoking.  Asthma and who also smoke. SIGNS AND SYMPTOMS  Chronic bronchitis may cause the following:   A cough that brings up mucus (productive cough).  Shortness of breath.  Early morning headache.  Wheezing.  Chest discomfort.   Recurring respiratory infections. DIAGNOSIS  Your health care provider may confirm the diagnosis by:  Taking your medical history.  Performing a physical exam.  Taking a chest X-ray.   Performing pulmonary function tests. TREATMENT  Treatment involves controlling symptoms with medicines, oxygen therapy, or making lifestyle changes, such as exercising and eating a healthy, well-balanced diet. Medicines could include:  Inhalers to improve air flow in and out of your lungs.  Antibiotics to treat bacterial infections, such as pneumonia, sinus infections, and acute bronchitis. As a preventative measure, your health care provider may recommend routine vaccinations for influenza and pneumonia. This is to prevent infection and hospitalization since you may be more at risk for these types of infections.  HOME CARE INSTRUCTIONS  Take medicines only as directed by your health care provider.   If you smoke  cigarettes, chew tobacco, or use electronic cigarettes, quit. If you need help quitting, ask your health care provider.  Avoid pollen, dust, animal dander, molds, smoke, and other things that cause shortness of breath or wheezing attacks.  Talk to your health care provider about possible exercise routines. Regular exercise is very important to help you feel better.  If you are prescribed oxygen use at home follow these guidelines:  Never smoke while using oxygen. Oxygen does not burn or explode, but flammable materials will burn faster in the presence of oxygen.  Keep a fire extinguisher close by. Let your fire department know that you have oxygen in your home.  Warn visitors not to smoke near you when you are using oxygen. Put up "no smoking" signs in your home where you most often use the oxygen.  Regularly test your smoke detectors at home to make sure they work. If you receive care in your home from a nurse or other health care provider, he or she may also check to make sure your smoke detectors work.  Ask your health care provider whether you would benefit from a pulmonary rehabilitation program.  Do not wait to get medical care if you have any concerning symptoms. Delays could cause permanent injury and may be life threatening. SEEK MEDICAL CARE IF:  You have increased coughing or shortness of breath or both.  You have muscle aches.  You have chest pain.  Your mucus gets thicker.  Your mucus changes from clear or white to yellow, green, gray, or bloody. SEEK IMMEDIATE MEDICAL CARE IF:  Your usual medicines do not stop your wheezing.   You have increased difficulty breathing.     You have any problems with the medicine you are taking, such as a rash, itching, swelling, or trouble breathing. MAKE SURE YOU:   Understand these instructions.  Will watch your condition.  Will get help right away if you are not doing well or get worse.   This information is not intended to  replace advice given to you by your health care provider. Make sure you discuss any questions you have with your health care provider.   Document Released: 10/25/2005 Document Revised: 01/28/2014 Document Reviewed: 02/15/2013 Elsevier Interactive Patient Education 2016 [employer]  

## 2015-03-04 NOTE — ED Notes (Signed)
Pt states that he has had a runny nose and cough all week.

## 2015-03-04 NOTE — ED Notes (Signed)
MD at bedside. 

## 2015-03-04 NOTE — ED Notes (Signed)
Patient transported to X-ray 

## 2015-03-04 NOTE — ED Provider Notes (Signed)
CSN: [id number]     Arrival date & time 03/04/15  1204 History   First MD Initiated Contact with Patient 03/04/15 1239     Chief Complaint  Patient presents with  . URI     (Consider location/radiation/quality/duration/timing/severity/associated sxs/prior Treatment) HPI  Troy [name] is a 75 y.o. male who presents to the Emergency Department complaining of cough and nasal congestion for one week.  He states the cough is mostly non-productive although occasionally it is productive of dark sputum.  He has albuterol at home and has been taking treatments regularly with some relief.  He has not tried any OTC cold or cough medications.  He denies fever, vomiting, shortness of breath greater than baseline, and chest pain.     Past Medical History  Diagnosis Date  . CAD (coronary artery disease) 1996    status post PCI of the RCA   . Ischemic dilated cardiomyopathy   . HTN (hypertension)   . GERD (gastroesophageal reflux disease)   . Personal history of DVT (deep vein thrombosis)   . Diabetes mellitus, type 2 ([place])   . DJD (degenerative joint disease)   . Gout   . Prostatitis   . Nephrolithiasis   . Atrial fibrillation ([place])   . Congestive heart failure, unspecified   . Other primary cardiomyopathies   . COPD (chronic obstructive pulmonary disease) (HCC)     Pt on home O2 at night and PRN, unsure of date of diagnosis.   Past Surgical History  Procedure Laterality Date  . Back surgery    . Coronary stent placement    . Cardiac defibrillator placement    . Pacemaker insertion     Family History  Problem Relation Age of Onset  . Heart disease Father   . Cancer Father     LUNG  . Stroke Paternal Uncle   . Colon cancer Mother   . Heart attack Neg Hx   . Hyperlipidemia Neg Hx   . Hypertension Neg Hx    Social History  Substance Use Topics  . Smoking status: Former Smoker -- 4.00 packs/day for 50 years    Types: Cigarettes    Quit date: 01/22/1980  . Smokeless tobacco:  Never Used  . Alcohol Use: No     Comment: denies    Review of Systems  Constitutional: Negative for fever, chills, activity change and appetite change.  HENT: Positive for congestion and rhinorrhea. Negative for facial swelling, sore throat and trouble swallowing.   Eyes: Negative for visual disturbance.  Respiratory: Positive for cough. Negative for chest tightness, shortness of breath, wheezing and stridor.   Gastrointestinal: Negative for nausea and vomiting.  Musculoskeletal: Negative for neck pain and neck stiffness.  Skin: Negative.  Negative for rash.  Neurological: Negative for dizziness, weakness, numbness and headaches.  Hematological: Negative for adenopathy.  Psychiatric/Behavioral: Negative for confusion.  All other systems reviewed and are negative.     Allergies  Benadryl  Home Medications   Prior to Admission medications   Medication Sig Start Date End Date Taking? Authorizing Provider  acetaminophen (TYLENOL) 500 MG tablet Take 500 mg by mouth every 6 (six) hours as needed for mild pain or moderate pain.    Yes Historical Provider, MD  albuterol (ACCUNEB) 1.25 MG/3ML nebulizer solution INHALE ONE VIAL VIA NEBULIZER EVERY 6 HOURS AS NEEDED FOR WHEEZING. 02/09/14  Yes Collene Gobble, MD  alfuzosin (UROXATRAL) 10 MG 24 hr tablet Take 10 mg by mouth daily with breakfast.  Yes Historical Provider, MD  allopurinol (ZYLOPRIM) 100 MG tablet Take 100 mg by mouth daily.   Yes Historical Provider, MD  atorvastatin (LIPITOR) 10 MG tablet Take 1 tablet (10 mg total) by mouth at bedtime. 08/03/14  Yes Jettie Booze, MD  carvedilol (COREG) 12.5 MG tablet Take 1 tablet (12.5 mg total) by mouth 2 (two) times daily with a meal. 03/03/15  Yes Jettie Booze, MD  digoxin 62.5 MCG TABS Take 0.0625 mg by mouth daily. 01/20/15  Yes Isaiah Serge, NP  fluticasone (FLONASE) 50 MCG/ACT nasal spray Place 2 sprays into both nostrils at bedtime.  04/07/13  Yes Historical Provider,  MD  furosemide (LASIX) 40 MG tablet Take 80 mg in the AM and 40 mg in the PM Patient taking differently: Take 40-80 mg by mouth 2 (two) times daily. Take 80 mg in the AM and 40 mg in the PM 03/03/15  Yes Jettie Booze, MD  guaiFENesin-dextromethorphan [facility] DM) 100-10 MG/5ML syrup Take 5 mLs by mouth every 4 (four) hours as needed for cough. 11/26/13  Yes Kinnie Feil, MD  hydrALAZINE (APRESOLINE) 25 MG tablet TAKE ONE TABLET BY MOUTH THREE TIMES DAILY. 02/20/15  Yes Jettie Booze, MD  loratadine (CLARITIN) 10 MG tablet Take 10 mg by mouth daily. For allergies   Yes Historical Provider, MD  losartan (COZAAR) 50 MG tablet Take 1 tablet (50 mg total) by mouth daily. 02/09/15  Yes Jettie Booze, MD  mometasone-formoterol [facility]) 100-5 MCG/ACT AERO Inhale 2 puffs into the lungs 2 (two) times daily. 11/26/13  Yes Kinnie Feil, MD  omeprazole (PRILOSEC) 40 MG capsule Take 40 mg by mouth at bedtime.    Yes Historical Provider, MD  potassium chloride SA (K-DUR,KLOR-CON) 20 MEQ tablet Take 1 tablet (20 mEq total) by mouth daily. 09/24/14  Yes Milton Ferguson, MD  PROAIR HFA 108 (90 BASE) MCG/ACT inhaler INHALE 2 PUFFS EVERY 4 HOURS AS NEEDED FOR WHEEZING OR SHORTNESS OF BREATH. 09/05/14  Yes Collene Gobble, MD  spironolactone (ALDACTONE) 25 MG tablet Take 1 tablet (25 mg total) by mouth daily. 12/19/14  Yes Evans Lance, MD  warfarin (COUMADIN) 1 MG tablet Take 2-3 tablets (2-3 mg total) by mouth daily. As directed by [facility] Patient taking differently: Take 2-3 mg by mouth See admin instructions. Take 3 mg on Wednesday and Saturday.  Take 2 mg all other days. 01/24/15  Yes Jettie Booze, MD   BP 123/96 mmHg  Pulse 78  Temp(Src) 97.6 F (36.4 C)  Resp 22  Ht 5\' 5"  (1.651 m)  Wt 73.029 kg  BMI 26.79 kg/m2  SpO2 92% Physical Exam  Constitutional: He is oriented to person, place, and time. He appears well-developed and well-nourished. No distress.  HENT:  Head:  Normocephalic and atraumatic.  Right Ear: Tympanic membrane and ear canal normal.  Left Ear: Tympanic membrane and ear canal normal.  Nose: Mucosal edema and rhinorrhea present.  Mouth/Throat: Uvula is midline and mucous membranes are normal. No trismus in the jaw. No uvula swelling. No oropharyngeal exudate, posterior oropharyngeal edema, posterior oropharyngeal erythema or tonsillar abscesses.  Eyes: Conjunctivae are normal.  Neck: Normal range of motion and phonation normal. Neck supple. No Brudzinski's sign and no Kernig's sign noted.  Cardiovascular: Normal rate, regular rhythm, normal heart sounds and intact distal pulses.   No murmur heard. Pulmonary/Chest: Effort normal. No respiratory distress. He has wheezes. He has no rales.  Inspiratory and expiratory wheezes.  Actively  coughing.  No rales.  No distress  Abdominal: Soft. He exhibits no distension. There is no tenderness. There is no rebound and no guarding.  Musculoskeletal: He exhibits no edema.  Lymphadenopathy:    He has no cervical adenopathy.  Neurological: He is alert and oriented to person, place, and time. He exhibits normal muscle tone. Coordination normal.  Skin: Skin is warm and dry.  Nursing note and vitals reviewed.   ED Course  Procedures (including critical care time) Labs Review Labs Reviewed - No data to display  Imaging Review Dg Chest 2 View  03/04/2015  CLINICAL DATA:  75 year old male with cough and shortness of breath for 2 days. EXAM: CHEST  2 VIEW COMPARISON:  01/02/2015 and prior exams FINDINGS: Cardiomegaly and left-sided ICD/ pacemaker again noted. COPD/emphysema again noted. There is no evidence of focal airspace disease, pulmonary edema, suspicious pulmonary nodule/mass, pleural effusion, or pneumothorax. No acute bony abnormalities are identified. IMPRESSION: No evidence of acute cardiopulmonary disease. COPD/ emphysema and cardiomegaly. Electronically Signed   By: Margarette Canada M.D.   On: 03/04/2015  12:51   I have personally reviewed and evaluated these images and lab results as part of my medical decision-making.   EKG Interpretation None      MDM   Final diagnoses:  COPD bronchitis   Lung sounds improved on recheck, nebs and prednisone given.  Pt well appearing, non-toxic.  No hypoxia.    Pt also seen by Dr. Jeneen Rinks and care plan discussed. Pt agrees to continue nebs at home and I will Rx doxycycline and prednisone.      Kem Parkinson, PA-C 03/05/15 1712  Tanna Furry, MD 03/14/15 [phone]

## 2015-03-05 ENCOUNTER — Encounter (HOSPITAL_COMMUNITY): Payer: Self-pay

## 2015-03-05 ENCOUNTER — Inpatient Hospital Stay (HOSPITAL_COMMUNITY)
Admission: EM | Admit: 2015-03-05 | Discharge: 2015-03-15 | DRG: 190 | Disposition: A | Discharge status: Skilled Nursing Facility | Payer: Medicare HMO | Attending: Internal Medicine | Admitting: Internal Medicine

## 2015-03-05 ENCOUNTER — Emergency Department (HOSPITAL_COMMUNITY): Payer: Medicare HMO

## 2015-03-05 DIAGNOSIS — I480 Paroxysmal atrial fibrillation: Secondary | ICD-10-CM | POA: Diagnosis present

## 2015-03-05 DIAGNOSIS — Z9981 Dependence on supplemental oxygen: Secondary | ICD-10-CM | POA: Diagnosis not present

## 2015-03-05 DIAGNOSIS — N400 Enlarged prostate without lower urinary tract symptoms: Secondary | ICD-10-CM

## 2015-03-05 DIAGNOSIS — R0602 Shortness of breath: Secondary | ICD-10-CM

## 2015-03-05 DIAGNOSIS — I255 Ischemic cardiomyopathy: Secondary | ICD-10-CM | POA: Diagnosis present

## 2015-03-05 DIAGNOSIS — I130 Hypertensive heart and chronic kidney disease with heart failure and stage 1 through stage 4 chronic kidney disease, or unspecified chronic kidney disease: Secondary | ICD-10-CM | POA: Diagnosis present

## 2015-03-05 DIAGNOSIS — E1122 Type 2 diabetes mellitus with diabetic chronic kidney disease: Secondary | ICD-10-CM | POA: Diagnosis present

## 2015-03-05 DIAGNOSIS — Z7952 Long term (current) use of systemic steroids: Secondary | ICD-10-CM | POA: Diagnosis not present

## 2015-03-05 DIAGNOSIS — Z7901 Long term (current) use of anticoagulants: Secondary | ICD-10-CM | POA: Diagnosis not present

## 2015-03-05 DIAGNOSIS — J9601 Acute respiratory failure with hypoxia: Secondary | ICD-10-CM | POA: Diagnosis not present

## 2015-03-05 DIAGNOSIS — M109 Gout, unspecified: Secondary | ICD-10-CM | POA: Diagnosis present

## 2015-03-05 DIAGNOSIS — Z79899 Other long term (current) drug therapy: Secondary | ICD-10-CM | POA: Diagnosis not present

## 2015-03-05 DIAGNOSIS — Z7982 Long term (current) use of aspirin: Secondary | ICD-10-CM | POA: Diagnosis not present

## 2015-03-05 DIAGNOSIS — I5022 Chronic systolic (congestive) heart failure: Secondary | ICD-10-CM | POA: Diagnosis present

## 2015-03-05 DIAGNOSIS — I2510 Atherosclerotic heart disease of native coronary artery without angina pectoris: Secondary | ICD-10-CM | POA: Diagnosis present

## 2015-03-05 DIAGNOSIS — N179 Acute kidney failure, unspecified: Secondary | ICD-10-CM | POA: Diagnosis present

## 2015-03-05 DIAGNOSIS — R609 Edema, unspecified: Secondary | ICD-10-CM

## 2015-03-05 DIAGNOSIS — Z87891 Personal history of nicotine dependence: Secondary | ICD-10-CM | POA: Diagnosis not present

## 2015-03-05 DIAGNOSIS — J441 Chronic obstructive pulmonary disease with (acute) exacerbation: Secondary | ICD-10-CM | POA: Diagnosis present

## 2015-03-05 DIAGNOSIS — I420 Dilated cardiomyopathy: Secondary | ICD-10-CM | POA: Diagnosis present

## 2015-03-05 DIAGNOSIS — Z955 Presence of coronary angioplasty implant and graft: Secondary | ICD-10-CM | POA: Diagnosis not present

## 2015-03-05 DIAGNOSIS — Z95810 Presence of automatic (implantable) cardiac defibrillator: Secondary | ICD-10-CM | POA: Diagnosis present

## 2015-03-05 DIAGNOSIS — I482 Chronic atrial fibrillation, unspecified: Secondary | ICD-10-CM | POA: Diagnosis present

## 2015-03-05 DIAGNOSIS — Z66 Do not resuscitate: Secondary | ICD-10-CM | POA: Diagnosis present

## 2015-03-05 DIAGNOSIS — B974 Respiratory syncytial virus as the cause of diseases classified elsewhere: Secondary | ICD-10-CM | POA: Diagnosis present

## 2015-03-05 DIAGNOSIS — K219 Gastro-esophageal reflux disease without esophagitis: Secondary | ICD-10-CM | POA: Diagnosis present

## 2015-03-05 DIAGNOSIS — N289 Disorder of kidney and ureter, unspecified: Secondary | ICD-10-CM

## 2015-03-05 DIAGNOSIS — Z7951 Long term (current) use of inhaled steroids: Secondary | ICD-10-CM | POA: Diagnosis not present

## 2015-03-05 DIAGNOSIS — N189 Chronic kidney disease, unspecified: Secondary | ICD-10-CM

## 2015-03-05 DIAGNOSIS — I10 Essential (primary) hypertension: Secondary | ICD-10-CM | POA: Diagnosis not present

## 2015-03-05 DIAGNOSIS — N183 Chronic kidney disease, stage 3 (moderate): Secondary | ICD-10-CM | POA: Diagnosis present

## 2015-03-05 LAB — CBC WITH DIFFERENTIAL/PLATELET
Basophils Absolute: 0 10*3/uL (ref 0.0–0.1)
Basophils Relative: 0 %
EOS PCT: 0 %
Eosinophils Absolute: 0 10*3/uL (ref 0.0–0.7)
HEMATOCRIT: 37.2 % — AB (ref 39.0–52.0)
Hemoglobin: 12.8 g/dL — ABNORMAL LOW (ref 13.0–17.0)
LYMPHS PCT: 9 %
Lymphs Abs: 1.4 10*3/uL (ref 0.7–4.0)
MCH: 34 pg (ref 26.0–34.0)
MCHC: 34.4 g/dL (ref 30.0–36.0)
MCV: 98.9 fL (ref 78.0–100.0)
MONO ABS: 0.9 10*3/uL (ref 0.1–1.0)
MONOS PCT: 6 %
NEUTROS ABS: 12.5 10*3/uL — AB (ref 1.7–7.7)
Neutrophils Relative %: 85 %
Platelets: 130 10*3/uL — ABNORMAL LOW (ref 150–400)
RBC: 3.76 MIL/uL — ABNORMAL LOW (ref 4.22–5.81)
RDW: 14.6 % (ref 11.5–15.5)
WBC: 14.8 10*3/uL — ABNORMAL HIGH (ref 4.0–10.5)

## 2015-03-05 LAB — URINALYSIS, ROUTINE W REFLEX MICROSCOPIC
Bilirubin Urine: NEGATIVE
GLUCOSE, UA: NEGATIVE mg/dL
HGB URINE DIPSTICK: NEGATIVE
KETONES UR: NEGATIVE mg/dL
LEUKOCYTES UA: NEGATIVE
Nitrite: NEGATIVE
PH: 5 (ref 5.0–8.0)
Protein, ur: NEGATIVE mg/dL
Specific Gravity, Urine: 1.011 (ref 1.005–1.030)

## 2015-03-05 LAB — BASIC METABOLIC PANEL
ANION GAP: 11 (ref 5–15)
BUN: 42 mg/dL — AB (ref 6–20)
CALCIUM: 9.4 mg/dL (ref 8.9–10.3)
CO2: 24 mmol/L (ref 22–32)
CREATININE: 2.37 mg/dL — AB (ref 0.61–1.24)
Chloride: 103 mmol/L (ref 101–111)
GFR calc Af Amer: 29 mL/min — ABNORMAL LOW (ref >60)
GFR calc non Af Amer: 25 mL/min — ABNORMAL LOW (ref >60)
GLUCOSE: 137 mg/dL — AB (ref 65–99)
Potassium: 4.2 mmol/L (ref 3.5–5.1)
Sodium: 138 mmol/L (ref 135–145)

## 2015-03-05 LAB — SODIUM, URINE, RANDOM: SODIUM UR: 71 mmol/L

## 2015-03-05 LAB — I-STAT TROPONIN, ED: Troponin i, poc: 0.02 ng/mL (ref 0.00–0.08)

## 2015-03-05 LAB — BLOOD GAS, ARTERIAL
ACID-BASE DEFICIT: 1.4 mmol/L (ref 0.0–2.0)
Bicarbonate: 22.8 mEq/L (ref 20.0–24.0)
DRAWN BY: 295031
O2 Content: 2 L/min
O2 SAT: 87.2 %
PATIENT TEMPERATURE: 98.6
PO2 ART: 55.4 mmHg — AB (ref 80.0–100.0)
TCO2: 20.4 mmol/L (ref 0–100)
pCO2 arterial: 38.3 mmHg (ref 35.0–45.0)
pH, Arterial: 7.391 (ref 7.350–7.450)

## 2015-03-05 LAB — PROTIME-INR
INR: 2.41 — AB (ref 0.00–1.49)
Prothrombin Time: 26 seconds — ABNORMAL HIGH (ref 11.6–15.2)

## 2015-03-05 LAB — MAGNESIUM: MAGNESIUM: 1.6 mg/dL — AB (ref 1.7–2.4)

## 2015-03-05 LAB — BRAIN NATRIURETIC PEPTIDE: B Natriuretic Peptide: 220.7 pg/mL — ABNORMAL HIGH (ref 0.0–100.0)

## 2015-03-05 LAB — I-STAT CG4 LACTIC ACID, ED: Lactic Acid, Venous: 1.13 mmol/L (ref 0.5–2.0)

## 2015-03-05 LAB — CREATININE, URINE, RANDOM: Creatinine, Urine: 52.92 mg/dL

## 2015-03-05 LAB — GLUCOSE, CAPILLARY: Glucose-Capillary: 239 mg/dL — ABNORMAL HIGH (ref 65–99)

## 2015-03-05 MED ORDER — PANTOPRAZOLE SODIUM 40 MG PO TBEC
40.0000 mg | DELAYED_RELEASE_TABLET | Freq: Every day | ORAL | Status: DC
  Administered 2015-03-05 – 2015-03-15 (×11): 40 mg via ORAL
  Filled 2015-03-05 (×11): qty 1

## 2015-03-05 MED ORDER — IPRATROPIUM BROMIDE 0.02 % IN SOLN
0.5000 mg | Freq: Four times a day (QID) | RESPIRATORY_TRACT | Status: DC
  Administered 2015-03-05 – 2015-03-06 (×5): 0.5 mg via RESPIRATORY_TRACT
  Filled 2015-03-05 (×5): qty 2.5

## 2015-03-05 MED ORDER — ONDANSETRON HCL 4 MG/2ML IJ SOLN: 4.0000 mg | Freq: Four times a day (QID) | INTRAMUSCULAR | Status: DC | PRN

## 2015-03-05 MED ORDER — ALUM & MAG HYDROXIDE-SIMETH 200-200-20 MG/5ML PO SUSP
30.0000 mL | Freq: Four times a day (QID) | ORAL | Status: DC | PRN
  Administered 2015-03-11: 30 mL via ORAL
  Filled 2015-03-05: qty 30

## 2015-03-05 MED ORDER — BUDESONIDE 0.25 MG/2ML IN SUSP
0.2500 mg | Freq: Two times a day (BID) | RESPIRATORY_TRACT | Status: DC
  Administered 2015-03-05 – 2015-03-13 (×15): 0.25 mg via RESPIRATORY_TRACT
  Filled 2015-03-05 (×15): qty 2

## 2015-03-05 MED ORDER — INSULIN ASPART 100 UNIT/ML ~~LOC~~ SOLN
0.0000 [IU] | Freq: Three times a day (TID) | SUBCUTANEOUS | Status: DC
  Administered 2015-03-06: 3 [IU] via SUBCUTANEOUS
  Administered 2015-03-06: 2 [IU] via SUBCUTANEOUS
  Administered 2015-03-06: 3 [IU] via SUBCUTANEOUS
  Administered 2015-03-07: 2 [IU] via SUBCUTANEOUS
  Administered 2015-03-07: 3 [IU] via SUBCUTANEOUS
  Administered 2015-03-07: 5 [IU] via SUBCUTANEOUS
  Administered 2015-03-08 (×3): 3 [IU] via SUBCUTANEOUS
  Administered 2015-03-09 (×2): 2 [IU] via SUBCUTANEOUS
  Administered 2015-03-09 – 2015-03-10 (×2): 3 [IU] via SUBCUTANEOUS
  Administered 2015-03-11: 5 [IU] via SUBCUTANEOUS
  Administered 2015-03-12: 2 [IU] via SUBCUTANEOUS
  Administered 2015-03-13: 5 [IU] via SUBCUTANEOUS
  Administered 2015-03-13 – 2015-03-15 (×4): 2 [IU] via SUBCUTANEOUS

## 2015-03-05 MED ORDER — ATORVASTATIN CALCIUM 10 MG PO TABS
10.0000 mg | ORAL_TABLET | Freq: Every day | ORAL | Status: DC
  Administered 2015-03-05 – 2015-03-14 (×10): 10 mg via ORAL
  Filled 2015-03-05 (×10): qty 1

## 2015-03-05 MED ORDER — WARFARIN SODIUM 2 MG PO TABS
2.0000 mg | ORAL_TABLET | Freq: Once | ORAL | Status: AC
  Administered 2015-03-05: 2 mg via ORAL
  Filled 2015-03-05: qty 1

## 2015-03-05 MED ORDER — LEVALBUTEROL HCL 0.63 MG/3ML IN NEBU
0.6300 mg | INHALATION_SOLUTION | RESPIRATORY_TRACT | Status: DC | PRN
Start: 2015-03-05 — End: 2015-03-13
  Administered 2015-03-13: 0.63 mg via RESPIRATORY_TRACT
  Filled 2015-03-05: qty 3

## 2015-03-05 MED ORDER — FLUTICASONE PROPIONATE 50 MCG/ACT NA SUSP
2.0000 | Freq: Every day | NASAL | Status: DC | PRN
  Filled 2015-03-05: qty 16

## 2015-03-05 MED ORDER — ACETAMINOPHEN 500 MG PO TABS
500.0000 mg | ORAL_TABLET | Freq: Four times a day (QID) | ORAL | Status: DC | PRN
Start: 2015-03-05 — End: 2015-03-05

## 2015-03-05 MED ORDER — ACETAMINOPHEN 650 MG RE SUPP
650.0000 mg | Freq: Four times a day (QID) | RECTAL | Status: DC | PRN
Start: 2015-03-05 — End: 2015-03-15

## 2015-03-05 MED ORDER — METHYLPREDNISOLONE SODIUM SUCC 125 MG IJ SOLR
125.0000 mg | Freq: Once | INTRAMUSCULAR | Status: AC
  Administered 2015-03-05: 125 mg via INTRAVENOUS
  Filled 2015-03-05: qty 2

## 2015-03-05 MED ORDER — OSELTAMIVIR PHOSPHATE 75 MG PO CAPS
75.0000 mg | ORAL_CAPSULE | Freq: Two times a day (BID) | ORAL | Status: DC
  Filled 2015-03-05: qty 1

## 2015-03-05 MED ORDER — FLEET ENEMA 7-19 GM/118ML RE ENEM: 1.0000 | ENEMA | Freq: Once | RECTAL | Status: DC | PRN

## 2015-03-05 MED ORDER — ALLOPURINOL 100 MG PO TABS
100.0000 mg | ORAL_TABLET | Freq: Every day | ORAL | Status: DC
  Administered 2015-03-05 – 2015-03-15 (×11): 100 mg via ORAL
  Filled 2015-03-05 (×11): qty 1

## 2015-03-05 MED ORDER — LEVALBUTEROL HCL 0.63 MG/3ML IN NEBU
0.6300 mg | INHALATION_SOLUTION | Freq: Four times a day (QID) | RESPIRATORY_TRACT | Status: DC
  Administered 2015-03-05 – 2015-03-06 (×5): 0.63 mg via RESPIRATORY_TRACT
  Filled 2015-03-05 (×6): qty 3

## 2015-03-05 MED ORDER — IPRATROPIUM BROMIDE 0.02 % IN SOLN: 0.5000 mg | RESPIRATORY_TRACT | Status: DC | PRN

## 2015-03-05 MED ORDER — SODIUM CHLORIDE 0.9 % IV SOLN
INTRAVENOUS | Status: DC
  Administered 2015-03-05 – 2015-03-11 (×12): via INTRAVENOUS

## 2015-03-05 MED ORDER — HYDRALAZINE HCL 25 MG PO TABS
25.0000 mg | ORAL_TABLET | Freq: Three times a day (TID) | ORAL | Status: DC
  Administered 2015-03-05 – 2015-03-15 (×29): 25 mg via ORAL
  Filled 2015-03-05 (×29): qty 1

## 2015-03-05 MED ORDER — WARFARIN - PHARMACIST DOSING INPATIENT: Freq: Every day | Status: DC

## 2015-03-05 MED ORDER — CARVEDILOL 12.5 MG PO TABS
12.5000 mg | ORAL_TABLET | Freq: Two times a day (BID) | ORAL | Status: DC
  Administered 2015-03-06 – 2015-03-15 (×19): 12.5 mg via ORAL
  Filled 2015-03-05 (×19): qty 1

## 2015-03-05 MED ORDER — DIGOXIN 0.05 MG/ML PO SOLN
0.0313 mg | Freq: Every day | ORAL | Status: DC
  Administered 2015-03-05 – 2015-03-15 (×11): 0.0313 mg via ORAL
  Filled 2015-03-05 (×12): qty 0.63

## 2015-03-05 MED ORDER — ALBUTEROL (5 MG/ML) CONTINUOUS INHALATION SOLN
10.0000 mg/h | INHALATION_SOLUTION | RESPIRATORY_TRACT | Status: DC
  Administered 2015-03-05: 10 mg/h via RESPIRATORY_TRACT
  Filled 2015-03-05: qty 20

## 2015-03-05 MED ORDER — ALBUTEROL SULFATE (2.5 MG/3ML) 0.083% IN NEBU
5.0000 mg | INHALATION_SOLUTION | Freq: Once | RESPIRATORY_TRACT | Status: AC
  Administered 2015-03-05: 5 mg via RESPIRATORY_TRACT
  Filled 2015-03-05: qty 6

## 2015-03-05 MED ORDER — SORBITOL 70 % SOLN: 30.0000 mL | Freq: Every day | Status: DC | PRN

## 2015-03-05 MED ORDER — MORPHINE SULFATE (PF) 2 MG/ML IV SOLN: 2.0000 mg | INTRAVENOUS | Status: DC | PRN

## 2015-03-05 MED ORDER — SENNOSIDES-DOCUSATE SODIUM 8.6-50 MG PO TABS
1.0000 | ORAL_TABLET | Freq: Every evening | ORAL | Status: DC | PRN
  Administered 2015-03-07: 1 via ORAL

## 2015-03-05 MED ORDER — LEVOFLOXACIN IN D5W 500 MG/100ML IV SOLN
500.0000 mg | INTRAVENOUS | Status: DC
  Administered 2015-03-05: 500 mg via INTRAVENOUS
  Filled 2015-03-05: qty 100

## 2015-03-05 MED ORDER — ONDANSETRON HCL 4 MG PO TABS: 4.0000 mg | ORAL_TABLET | Freq: Four times a day (QID) | ORAL | Status: DC | PRN

## 2015-03-05 MED ORDER — GUAIFENESIN ER 600 MG PO TB12
1200.0000 mg | ORAL_TABLET | Freq: Two times a day (BID) | ORAL | Status: DC
  Administered 2015-03-05 – 2015-03-10 (×11): 1200 mg via ORAL
  Filled 2015-03-05 (×11): qty 2

## 2015-03-05 MED ORDER — ACETAMINOPHEN 325 MG PO TABS
650.0000 mg | ORAL_TABLET | Freq: Four times a day (QID) | ORAL | Status: DC | PRN
  Administered 2015-03-08: 650 mg via ORAL
  Filled 2015-03-05: qty 2

## 2015-03-05 MED ORDER — ARFORMOTEROL TARTRATE 15 MCG/2ML IN NEBU
15.0000 ug | INHALATION_SOLUTION | Freq: Two times a day (BID) | RESPIRATORY_TRACT | Status: DC
  Administered 2015-03-05 – 2015-03-13 (×15): 15 ug via RESPIRATORY_TRACT
  Filled 2015-03-05 (×14): qty 2

## 2015-03-05 MED ORDER — HYDROCODONE-HOMATROPINE 5-1.5 MG/5ML PO SYRP
5.0000 mL | ORAL_SOLUTION | Freq: Four times a day (QID) | ORAL | Status: DC | PRN
  Administered 2015-03-06 – 2015-03-09 (×5): 5 mL via ORAL
  Filled 2015-03-05 (×6): qty 5

## 2015-03-05 MED ORDER — SODIUM CHLORIDE 0.9% FLUSH
3.0000 mL | Freq: Two times a day (BID) | INTRAVENOUS | Status: DC
  Administered 2015-03-05 – 2015-03-13 (×4): 3 mL via INTRAVENOUS

## 2015-03-05 MED ORDER — OSELTAMIVIR PHOSPHATE 30 MG PO CAPS
30.0000 mg | ORAL_CAPSULE | Freq: Two times a day (BID) | ORAL | Status: DC
  Administered 2015-03-05 – 2015-03-06 (×2): 30 mg via ORAL
  Filled 2015-03-05 (×3): qty 1

## 2015-03-05 MED ORDER — METHYLPREDNISOLONE SODIUM SUCC 125 MG IJ SOLR
80.0000 mg | Freq: Four times a day (QID) | INTRAMUSCULAR | Status: DC
  Administered 2015-03-05 – 2015-03-07 (×6): 80 mg via INTRAVENOUS
  Filled 2015-03-05 (×7): qty 2

## 2015-03-05 MED ORDER — LORATADINE 10 MG PO TABS
10.0000 mg | ORAL_TABLET | Freq: Every day | ORAL | Status: DC
  Administered 2015-03-05 – 2015-03-15 (×11): 10 mg via ORAL
  Filled 2015-03-05 (×11): qty 1

## 2015-03-05 MED ORDER — ALFUZOSIN HCL ER 10 MG PO TB24
10.0000 mg | ORAL_TABLET | Freq: Every day | ORAL | Status: DC
  Administered 2015-03-06 – 2015-03-15 (×10): 10 mg via ORAL
  Filled 2015-03-05 (×12): qty 1

## 2015-03-05 NOTE — ED Notes (Signed)
Pt went to [facility] yesterday for cough/SOB. Given scripts which he filled last night.  Pt states shortness of breath fills worse.  Sats low in triage.

## 2015-03-05 NOTE — Progress Notes (Signed)
ANTICOAGULATION CONSULT NOTE - Initial Consult  Pharmacy Consult for Warfarin Indication: atrial fibrillation  Allergies  Allergen Reactions  . Benadryl [Diphenhydramine Hcl] Nausea And Vomiting    Patient Measurements: Height: 5\' 9"  (175.3 cm) Weight: 155 lb 6.8 oz (70.5 kg) IBW/kg (Calculated) : 70.7  Vital Signs: Temp: 97.3 F (36.3 C) (02/12 2013) Temp Source: Oral (02/12 2013) BP: 129/52 mmHg (02/12 2013) Pulse Rate: 83 (02/12 2013)  Labs:  Recent Labs  03/05/15 1300 03/05/15 1605  HGB 12.8*  --   HCT 37.2*  --   PLT 130*  --   LABPROT  --  26.0*  INR  --  2.41*  CREATININE 2.37*  --     Estimated Creatinine Clearance: 26.9 mL/min (by C-G formula based on Cr of 2.37).   Medical History: Past Medical History  Diagnosis Date  . CAD (coronary artery disease) 1996    status post PCI of the RCA   . Ischemic dilated cardiomyopathy   . HTN (hypertension)   . GERD (gastroesophageal reflux disease)   . Personal history of DVT (deep vein thrombosis)   . Diabetes mellitus, type 2 ([place])   . DJD (degenerative joint disease)   . Gout   . Prostatitis   . Nephrolithiasis   . Atrial fibrillation ([place])   . Congestive heart failure, unspecified   . Other primary cardiomyopathies   . COPD (chronic obstructive pulmonary disease) (HCC)     Pt on home O2 at night and PRN, unsure of date of diagnosis.    Medications:  Scheduled:  . [START ON 03/06/2015] alfuzosin  10 mg Oral Q breakfast  . allopurinol  100 mg Oral Daily  . arformoterol  15 mcg Nebulization BID  . atorvastatin  10 mg Oral QHS  . budesonide (PULMICORT) nebulizer solution  0.25 mg Nebulization BID  . [START ON 03/06/2015] carvedilol  12.5 mg Oral BID WC  . digoxin  0.0313 mg Oral Daily  . guaiFENesin  1,200 mg Oral BID  . hydrALAZINE  25 mg Oral TID  . [START ON 03/06/2015] insulin aspart  0-15 Units Subcutaneous TID WC  . ipratropium  0.5 mg Nebulization Q6H  . levalbuterol  0.63 mg Nebulization Q6H   . levofloxacin (LEVAQUIN) IV  500 mg Intravenous Q24H  . loratadine  10 mg Oral Daily  . methylPREDNISolone (SOLU-MEDROL) injection  80 mg Intravenous Q6H  . oseltamivir  30 mg Oral BID  . pantoprazole  40 mg Oral Daily  . sodium chloride flush  3 mL Intravenous Q12H  . warfarin  2 mg Oral Once  . [START ON 03/06/2015] Warfarin - Pharmacist Dosing Inpatient   Does not apply q1800   Infusions:  . sodium chloride 125 mL/hr at 03/05/15 1432    Assessment:  75 yr male with complaint of worsening cough, having been seen yesterday in [facility] ED for upper respiratory symptoms.  To be admitted for acute COPD exacerbation.  Significant PMH including CAD with s/p stent, AFib, HTN, DM, CHF.  On Warfarin PTA  PTA Warfarin regimen = 2mg  daily except 3mg  on Wed & Sat - pt reports last dose taken on 2/11 @ 17:00  INR upon admission = 2.41 (therapeutic)  Pharmacy consulted to dose warfarin during hospitalization  Goal of Therapy:  INR 2-3   Plan:   Warfarin 2mg  po x 1 tonight  Check INR daily  Myria Steenbergen, Toribio Harbour, PharmD 03/05/2015,9:26 PM

## 2015-03-05 NOTE — ED Notes (Signed)
Droplet precautions placed on door.  Dr. Grandville Silos in room with pt.

## 2015-03-05 NOTE — ED Provider Notes (Signed)
CSN: [id number]     Arrival date & time 03/05/15  1216 History   First MD Initiated Contact with Patient 03/05/15 1525     Chief Complaint  Patient presents with  . Cough  . Shortness of Breath     (Consider location/radiation/quality/duration/timing/severity/associated sxs/prior Treatment) HPI Patient presents with worsening shortness of breath this morning upon waking. Was seen at [facility] yesterday for nasal congestion, sinus pressure and cough. Was started on doxycycline and prednisone. He states his cough has worsened. Active of white sputum. He's had increased wheezing and episodic chest tightness. Denies any chest tightness currently. No new lower extremity swelling. No definite fever or chills. Past Medical History  Diagnosis Date  . CAD (coronary artery disease) 1996    status post PCI of the RCA   . Ischemic dilated cardiomyopathy   . HTN (hypertension)   . GERD (gastroesophageal reflux disease)   . Personal history of DVT (deep vein thrombosis)   . Diabetes mellitus, type 2 ([place])   . DJD (degenerative joint disease)   . Gout   . Prostatitis   . Nephrolithiasis   . Atrial fibrillation ([place])   . Congestive heart failure, unspecified   . Other primary cardiomyopathies   . COPD (chronic obstructive pulmonary disease) (HCC)     Pt on home O2 at night and PRN, unsure of date of diagnosis.   Past Surgical History  Procedure Laterality Date  . Back surgery    . Coronary stent placement    . Cardiac defibrillator placement    . Pacemaker insertion     Family History  Problem Relation Age of Onset  . Heart disease Father   . Cancer Father     LUNG  . Stroke Paternal Uncle   . Colon cancer Mother   . Heart attack Neg Hx   . Hyperlipidemia Neg Hx   . Hypertension Neg Hx    Social History  Substance Use Topics  . Smoking status: Former Smoker -- 4.00 packs/day for 50 years    Types: Cigarettes    Quit date: 01/22/1980  . Smokeless tobacco: Never Used  . Alcohol  Use: No     Comment: denies    Review of Systems  Constitutional: Negative for fever and chills.  HENT: Positive for congestion, rhinorrhea and sinus pressure. Negative for sore throat.   Respiratory: Positive for cough, shortness of breath and wheezing.   Gastrointestinal: Negative for nausea, vomiting, abdominal pain, diarrhea and constipation.  Musculoskeletal: Negative for back pain, neck pain and neck stiffness.  Skin: Negative for rash and wound.  Neurological: Negative for dizziness, syncope, weakness, light-headedness, numbness and headaches.  All other systems reviewed and are negative.     Allergies  Benadryl  Home Medications   Prior to Admission medications   Medication Sig Start Date End Date Taking? Authorizing Provider  acetaminophen (TYLENOL) 500 MG tablet Take 500 mg by mouth every 6 (six) hours as needed for mild pain or moderate pain.    Yes Historical Provider, MD  albuterol (ACCUNEB) 1.25 MG/3ML nebulizer solution INHALE ONE VIAL VIA NEBULIZER EVERY 6 HOURS AS NEEDED FOR WHEEZING. 02/09/14  Yes Collene Gobble, MD  alfuzosin (UROXATRAL) 10 MG 24 hr tablet Take 10 mg by mouth daily with breakfast.    Yes Historical Provider, MD  allopurinol (ZYLOPRIM) 100 MG tablet Take 100 mg by mouth daily.   Yes Historical Provider, MD  atorvastatin (LIPITOR) 10 MG tablet Take 1 tablet (10 mg total) by mouth  at bedtime. 08/03/14  Yes Jettie Booze, MD  carvedilol (COREG) 12.5 MG tablet Take 1 tablet (12.5 mg total) by mouth 2 (two) times daily with a meal. 03/03/15  Yes Jettie Booze, MD  digoxin 62.5 MCG TABS Take 0.0625 mg by mouth daily. Patient taking differently: Take 0.0313 mg by mouth daily.  01/20/15  Yes Isaiah Serge, NP  doxycycline (VIBRAMYCIN) 100 MG capsule Take 1 capsule (100 mg total) by mouth 2 (two) times daily. Patient taking differently: Take 100 mg by mouth 2 (two) times daily. Started 02/12 for 10 days 03/04/15  Yes Tammy Triplett, PA-C   fluticasone (FLONASE) 50 MCG/ACT nasal spray Place 2 sprays into both nostrils daily as needed for allergies.  04/07/13  Yes Historical Provider, MD  furosemide (LASIX) 40 MG tablet Take 80 mg in the AM and 40 mg in the PM Patient taking differently: Take 40-80 mg by mouth 2 (two) times daily. Take 80 mg in the AM and 40 mg in the PM 03/03/15  Yes Jettie Booze, MD  guaiFENesin-dextromethorphan [facility] DM) 100-10 MG/5ML syrup Take 5 mLs by mouth every 4 (four) hours as needed for cough. 11/26/13  Yes Kinnie Feil, MD  hydrALAZINE (APRESOLINE) 25 MG tablet TAKE ONE TABLET BY MOUTH THREE TIMES DAILY. 02/20/15  Yes Jettie Booze, MD  loratadine (CLARITIN) 10 MG tablet Take 10 mg by mouth daily. For allergies   Yes Historical Provider, MD  losartan (COZAAR) 50 MG tablet Take 1 tablet (50 mg total) by mouth daily. 02/09/15  Yes Jettie Booze, MD  mometasone-formoterol [facility]) 100-5 MCG/ACT AERO Inhale 2 puffs into the lungs 2 (two) times daily. 11/26/13  Yes Kinnie Feil, MD  omeprazole (PRILOSEC) 40 MG capsule Take 40 mg by mouth at bedtime.    Yes Historical Provider, MD  potassium chloride SA (K-DUR,KLOR-CON) 20 MEQ tablet Take 1 tablet (20 mEq total) by mouth daily. 09/24/14  Yes Milton Ferguson, MD  predniSONE (DELTASONE) 20 MG tablet Take 2 tablets (40 mg total) by mouth daily. For 5 days Patient taking differently: Take 40 mg by mouth daily. Started 02/12 For 5 days 03/04/15  Yes Tammy Triplett, PA-C  PROAIR HFA 108 (90 BASE) MCG/ACT inhaler INHALE 2 PUFFS EVERY 4 HOURS AS NEEDED FOR WHEEZING OR SHORTNESS OF BREATH. 09/05/14  Yes Collene Gobble, MD  spironolactone (ALDACTONE) 25 MG tablet Take 1 tablet (25 mg total) by mouth daily. 12/19/14  Yes Evans Lance, MD  warfarin (COUMADIN) 1 MG tablet Take 2-3 tablets (2-3 mg total) by mouth daily. As directed by [facility] Patient taking differently: Take 2-3 mg by mouth See admin instructions. Take 3 mg on Wednesday and  Saturday.  Take 2 mg all other days. 01/24/15  Yes Jettie Booze, MD   BP 107/56 mmHg  Pulse 78  Temp(Src) 98 F (36.7 C) (Oral)  Resp 18  SpO2 95% Physical Exam  Constitutional: He is oriented to person, place, and time. He appears well-developed and well-nourished. No distress.  HENT:  Head: Normocephalic and atraumatic.  Mouth/Throat: Oropharynx is clear and moist. No oropharyngeal exudate.  Bilateral nasal mucosal edema.  Eyes: EOM are normal. Pupils are equal, round, and reactive to light.  Neck: Normal range of motion. Neck supple.  Cardiovascular: Normal rate and regular rhythm.   Pulmonary/Chest: Effort normal. No respiratory distress. He has wheezes (extremities in all lung fields.). He has no rales.  Abdominal: Soft. Bowel sounds are normal. He exhibits no distension  and no mass. There is no tenderness. There is no rebound and no guarding.  Musculoskeletal: Normal range of motion. He exhibits no edema or tenderness.  No new lower extremity swelling. No calf tenderness.  Neurological: He is alert and oriented to person, place, and time.  Moves all ext without deficit. Sensation grossly intact.  Skin: Skin is warm and dry. No rash noted. No erythema.  Psychiatric: He has a normal mood and affect. His behavior is normal.  Nursing note and vitals reviewed.   ED Course  Procedures (including critical care time) Labs Review Labs Reviewed  CBC WITH DIFFERENTIAL/PLATELET - Abnormal; Notable for the following:    WBC 14.8 (*)    RBC 3.76 (*)    Hemoglobin 12.8 (*)    HCT 37.2 (*)    Platelets 130 (*)    Neutro Abs 12.5 (*)    All other components within normal limits  BASIC METABOLIC PANEL - Abnormal; Notable for the following:    Glucose, Bld 137 (*)    BUN 42 (*)    Creatinine, Ser 2.37 (*)    GFR calc non Af Amer 25 (*)    GFR calc Af Amer 29 (*)    All other components within normal limits  URINALYSIS, ROUTINE W REFLEX MICROSCOPIC (NOT AT [facility]) - Abnormal;  Notable for the following:    APPearance CLOUDY (*)    All other components within normal limits  BRAIN NATRIURETIC PEPTIDE - Abnormal; Notable for the following:    B Natriuretic Peptide 220.7 (*)    All other components within normal limits  PROTIME-INR - Abnormal; Notable for the following:    Prothrombin Time 26.0 (*)    INR 2.41 (*)    All other components within normal limits  URINE CULTURE  SODIUM, URINE, RANDOM  CREATININE, URINE, RANDOM  INFLUENZA PANEL BY PCR (TYPE A & B, H1N1)  BLOOD GAS, ARTERIAL  I-STAT CG4 LACTIC ACID, ED  Randolm Idol, ED    Imaging Review Dg Chest 2 View  03/05/2015  CLINICAL DATA:  Cough congestion, weakness shortness of breath for 5 days. Worsening this morning. EXAM: CHEST  2 VIEW COMPARISON:  03/04/2015; 03/04/2014; 12/25/2014 FINDINGS: Grossly unchanged cardiac silhouette and mediastinal contours. Atherosclerotic plaque within the thoracic aorta. Stable position of support apparatus. The lungs remain hyperexpanded with flattening of the diaphragms. No focal airspace opacities. No pleural effusion pneumothorax. No evidence of edema. Old right-sided sixth rib fracture. No acute osseus abnormalities. IMPRESSION: Hyperexpanded lungs without acute cardiopulmonary disease. Electronically Signed   By: Sandi Mariscal M.D.   On: 03/05/2015 13:51   Dg Chest 2 View  03/04/2015  CLINICAL DATA:  75 year old male with cough and shortness of breath for 2 days. EXAM: CHEST  2 VIEW COMPARISON:  01/02/2015 and prior exams FINDINGS: Cardiomegaly and left-sided ICD/ pacemaker again noted. COPD/emphysema again noted. There is no evidence of focal airspace disease, pulmonary edema, suspicious pulmonary nodule/mass, pleural effusion, or pneumothorax. No acute bony abnormalities are identified. IMPRESSION: No evidence of acute cardiopulmonary disease. COPD/ emphysema and cardiomegaly. Electronically Signed   By: Margarette Canada M.D.   On: 03/04/2015 12:51   I have personally  reviewed and evaluated these images and lab results as part of my medical decision-making.   EKG Interpretation   Date/Time:  Sunday March 05 2015 12:34:31 EST Ventricular Rate:  84 PR Interval:    QRS Duration: 132 QT Interval:  427 QTC Calculation: 505 R Axis:   74 Text Interpretation:  A-V  dual-paced complexes w/ some inhibition No  further analysis attempted due to paced rhythm Confirmed by Lita Mains  MD,  Demarious Kapur ([id number]) on 03/05/2015 3:56:41 PM      MDM   Final diagnoses:  COPD exacerbation ([place])  Renal insufficiency   Patient with persistent wheezing despite several nebulized treatment. He's had a bump in his creatinine. Patient requiring small amount of supplements option to keep oxygen saturations greater than 90. Will discuss with hospitalist and admit.   Patient has some improvement after hour-long breathing treatment but still has diffuse expiratory wheezes.    Julianne Rice, MD 03/05/15 1745

## 2015-03-05 NOTE — H&P (Signed)
[facility] History and Physical  Troy [name] [id number] DOB: [birth year] DOA: 03/05/2015  Referring physician: Dr. Lita Mains PCP: Barbette Merino, MD   Chief Complaint: Shortness of breath  HPI: Troy [name] is a 75 y.o. male  With history of coronary artery disease status post PCI to RCA, chronic atrial fibrillation on chronic anticoagulation, hypertension, gastroesophageal reflux disease, type 2 diabetes, history of CHF, history of gout who presents to the ED with a 5 day history of upper respiratory symptoms of productive cough of whitish sputum, congestion, rhinorrhea, worsening shortness of breath, chest tightness, wheezing. Patient denies any fevers, no chills, no nausea, no vomiting, no chest pain, no abdominal pain, no nausea, no vomiting, no diarrhea, no constipation, no dysuria, no melena, no hematemesis. Patient denies any sick contacts. Patient does endorse some occasional bright bloody streaks on the toilet paper. Patient was seen at [facility] emergency room 1 day prior to admission with similar symptoms was prescribed doxycycline and prednisone and discharged. Patient stated that he started taking the medications however he feels his cough has worsened and subsequently presented to the emergency room.  In the ED urinalysis which was done was nitrite negative leukocytes negative. Chest x-ray which was done was unremarkable for any acute infiltrate. INR was 2.41. Basic metabolic profile done at a BUN of 42 creatinine of 2.37 otherwise was within normal limits. Point-of-care troponin was negative. CBC had a white count of 14.8 hemoglobin of 12.8 platelets of 130 hours was within normal limits. BNP was 220.7. EKG was AV paced. Patient was given IV Solu-Medrol, continuous albuterol nebs in the emergency room.[facility] were called to admit the patient for further evaluation and management.   Review of Systems: As per history of present illness otherwise  negative. Constitutional:  No weight loss, night sweats, Fevers, chills, fatigue.  HEENT:  No headaches, Difficulty swallowing,Tooth/dental problems,Sore throat,  No sneezing, itching, ear ache, nasal congestion, post nasal drip,  Cardio-vascular:  No chest pain, Orthopnea, PND, swelling in lower extremities, anasarca, dizziness, palpitations  GI:  No heartburn, indigestion, abdominal pain, nausea, vomiting, diarrhea, change in bowel habits, loss of appetite  Resp:  No shortness of breath with exertion or at rest. No excess mucus, no productive cough, No non-productive cough, No coughing up of blood.No change in color of mucus.No wheezing.No chest wall deformity  Skin:  no rash or lesions.  GU:  no dysuria, change in color of urine, no urgency or frequency. No flank pain.  Musculoskeletal:  No joint pain or swelling. No decreased range of motion. No back pain.  Psych:  No change in mood or affect. No depression or anxiety. No memory loss.   Past Medical History  Diagnosis Date  . CAD (coronary artery disease) 1996    status post PCI of the RCA   . Ischemic dilated cardiomyopathy   . HTN (hypertension)   . GERD (gastroesophageal reflux disease)   . Personal history of DVT (deep vein thrombosis)   . Diabetes mellitus, type 2 ([place])   . DJD (degenerative joint disease)   . Gout   . Prostatitis   . Nephrolithiasis   . Atrial fibrillation ([place])   . Congestive heart failure, unspecified   . Other primary cardiomyopathies   . COPD (chronic obstructive pulmonary disease) (HCC)     Pt on home O2 at night and PRN, unsure of date of diagnosis.   Past Surgical History  Procedure Laterality Date  . Back surgery    . Coronary stent placement    .  Cardiac defibrillator placement    . Pacemaker insertion     Social History:  reports that he quit smoking about 35 years ago. His smoking use included Cigarettes. He has a 200 pack-year smoking history. He has never used smokeless tobacco.  He reports that he does not drink alcohol or use illicit drugs.  Allergies  Allergen Reactions  . Benadryl [Diphenhydramine Hcl] Nausea And Vomiting    Family History  Problem Relation Age of Onset  . Heart disease Father   . Cancer Father     LUNG  . Stroke Paternal Uncle   . Colon cancer Mother   . Heart attack Neg Hx   . Hyperlipidemia Neg Hx   . Hypertension Neg Hx    mother deceased in her 44s from cancer. Father deceased in his late 49s from natural causes.  Prior to Admission medications   Medication Sig Start Date End Date Taking? Authorizing Provider  acetaminophen (TYLENOL) 500 MG tablet Take 500 mg by mouth every 6 (six) hours as needed for mild pain or moderate pain.    Yes Historical Provider, MD  albuterol (ACCUNEB) 1.25 MG/3ML nebulizer solution INHALE ONE VIAL VIA NEBULIZER EVERY 6 HOURS AS NEEDED FOR WHEEZING. 02/09/14  Yes Collene Gobble, MD  alfuzosin (UROXATRAL) 10 MG 24 hr tablet Take 10 mg by mouth daily with breakfast.    Yes Historical Provider, MD  allopurinol (ZYLOPRIM) 100 MG tablet Take 100 mg by mouth daily.   Yes Historical Provider, MD  atorvastatin (LIPITOR) 10 MG tablet Take 1 tablet (10 mg total) by mouth at bedtime. 08/03/14  Yes Jettie Booze, MD  carvedilol (COREG) 12.5 MG tablet Take 1 tablet (12.5 mg total) by mouth 2 (two) times daily with a meal. 03/03/15  Yes Jettie Booze, MD  digoxin 62.5 MCG TABS Take 0.0625 mg by mouth daily. Patient taking differently: Take 0.0313 mg by mouth daily.  01/20/15  Yes Isaiah Serge, NP  doxycycline (VIBRAMYCIN) 100 MG capsule Take 1 capsule (100 mg total) by mouth 2 (two) times daily. Patient taking differently: Take 100 mg by mouth 2 (two) times daily. Started 02/12 for 10 days 03/04/15  Yes Tammy Triplett, PA-C  fluticasone (FLONASE) 50 MCG/ACT nasal spray Place 2 sprays into both nostrils daily as needed for allergies.  04/07/13  Yes Historical Provider, MD  furosemide (LASIX) 40 MG tablet Take 80  mg in the AM and 40 mg in the PM Patient taking differently: Take 40-80 mg by mouth 2 (two) times daily. Take 80 mg in the AM and 40 mg in the PM 03/03/15  Yes Jettie Booze, MD  guaiFENesin-dextromethorphan [facility] DM) 100-10 MG/5ML syrup Take 5 mLs by mouth every 4 (four) hours as needed for cough. 11/26/13  Yes Kinnie Feil, MD  hydrALAZINE (APRESOLINE) 25 MG tablet TAKE ONE TABLET BY MOUTH THREE TIMES DAILY. 02/20/15  Yes Jettie Booze, MD  loratadine (CLARITIN) 10 MG tablet Take 10 mg by mouth daily. For allergies   Yes Historical Provider, MD  losartan (COZAAR) 50 MG tablet Take 1 tablet (50 mg total) by mouth daily. 02/09/15  Yes Jettie Booze, MD  mometasone-formoterol [facility]) 100-5 MCG/ACT AERO Inhale 2 puffs into the lungs 2 (two) times daily. 11/26/13  Yes Kinnie Feil, MD  omeprazole (PRILOSEC) 40 MG capsule Take 40 mg by mouth at bedtime.    Yes Historical Provider, MD  potassium chloride SA (K-DUR,KLOR-CON) 20 MEQ tablet Take 1 tablet (20 mEq total) by  mouth daily. 09/24/14  Yes Milton Ferguson, MD  predniSONE (DELTASONE) 20 MG tablet Take 2 tablets (40 mg total) by mouth daily. For 5 days Patient taking differently: Take 40 mg by mouth daily. Started 02/12 For 5 days 03/04/15  Yes Tammy Triplett, PA-C  PROAIR HFA 108 (90 BASE) MCG/ACT inhaler INHALE 2 PUFFS EVERY 4 HOURS AS NEEDED FOR WHEEZING OR SHORTNESS OF BREATH. 09/05/14  Yes Collene Gobble, MD  spironolactone (ALDACTONE) 25 MG tablet Take 1 tablet (25 mg total) by mouth daily. 12/19/14  Yes Evans Lance, MD  warfarin (COUMADIN) 1 MG tablet Take 2-3 tablets (2-3 mg total) by mouth daily. As directed by [facility] Patient taking differently: Take 2-3 mg by mouth See admin instructions. Take 3 mg on Wednesday and Saturday.  Take 2 mg all other days. 01/24/15  Yes Jettie Booze, MD   Physical Exam: Filed Vitals:   03/05/15 1534 03/05/15 1603 03/05/15 1623 03/05/15 1625  BP: 115/65 107/56    Pulse:  90 78    Temp: 98 F (36.7 C)     TempSrc: Oral     Resp: 25 18    SpO2: 92% 90% 95% 95%    Wt Readings from Last 3 Encounters:  03/04/15 73.029 kg (161 lb)  03/03/15 72.576 kg (160 lb)  02/03/15 72.576 kg (160 lb)    General:  Well-developed well-nourished laying on a gurney no acute guarding or pulmonary distress. Speaking in full sentences. No use of accessory muscles of respiration. Eyes: PERRLA,EOMI, normal lids, irises & conjunctiva ENT: grossly normal hearing, lips & tongue. Nasal discharge noted. Neck: no LAD, masses or thyromegaly Cardiovascular: RRR, no m/r/g. No LE edema. Respiratory: Poor to fair air movement. Tight. Expiratory wheezing. No crackles. Some coarse rhonchorous breath sounds. Abdomen: soft, ntnd, positive bowel sounds, no rebound, no guarding Skin: no rash or induration seen on limited exam Musculoskeletal: grossly normal tone BUE/BLE Psychiatric: grossly normal mood and affect, speech fluent and appropriate Neurologic: Alert and oriented 3. Cranial nerves II through XII grossly intact. Sensation is intact. No focal deficits.           Labs on Admission:  Basic Metabolic Panel:  Recent Labs Lab 03/05/15 1300  NA 138  K 4.2  CL 103  CO2 24  GLUCOSE 137*  BUN 42*  CREATININE 2.37*  CALCIUM 9.4   Liver Function Tests: No results for input(s): AST, ALT, ALKPHOS, BILITOT, PROT, ALBUMIN in the last 168 hours. No results for input(s): LIPASE, AMYLASE in the last 168 hours. No results for input(s): AMMONIA in the last 168 hours. CBC:  Recent Labs Lab 03/05/15 1300  WBC 14.8*  NEUTROABS 12.5*  HGB 12.8*  HCT 37.2*  MCV 98.9  PLT 130*   Cardiac Enzymes: No results for input(s): CKTOTAL, CKMB, CKMBINDEX, TROPONINI in the last 168 hours.  BNP (last 3 results)  Recent Labs  12/25/14 0807 01/04/15 0825 03/05/15 1605  BNP 1078.9* 188.0* 220.7*    ProBNP (last 3 results)  Recent Labs  04/25/14 1205 05/02/14 1115  PROBNP 2433.0*  1499.0*    CBG: No results for input(s): GLUCAP in the last 168 hours.  Radiological Exams on Admission: Dg Chest 2 View  03/05/2015  CLINICAL DATA:  Cough congestion, weakness shortness of breath for 5 days. Worsening this morning. EXAM: CHEST  2 VIEW COMPARISON:  03/04/2015; 03/04/2014; 12/25/2014 FINDINGS: Grossly unchanged cardiac silhouette and mediastinal contours. Atherosclerotic plaque within the thoracic aorta. Stable position of support apparatus. The lungs  remain hyperexpanded with flattening of the diaphragms. No focal airspace opacities. No pleural effusion pneumothorax. No evidence of edema. Old right-sided sixth rib fracture. No acute osseus abnormalities. IMPRESSION: Hyperexpanded lungs without acute cardiopulmonary disease. Electronically Signed   By: Sandi Mariscal M.D.   On: 03/05/2015 13:51   Dg Chest 2 View  03/04/2015  CLINICAL DATA:  75 year old male with cough and shortness of breath for 2 days. EXAM: CHEST  2 VIEW COMPARISON:  01/02/2015 and prior exams FINDINGS: Cardiomegaly and left-sided ICD/ pacemaker again noted. COPD/emphysema again noted. There is no evidence of focal airspace disease, pulmonary edema, suspicious pulmonary nodule/mass, pleural effusion, or pneumothorax. No acute bony abnormalities are identified. IMPRESSION: No evidence of acute cardiopulmonary disease. COPD/ emphysema and cardiomegaly. Electronically Signed   By: Margarette Canada M.D.   On: 03/04/2015 12:51    EKG: Independently reviewed. AV paced  Assessment/Plan Principal Problem:   Acute exacerbation of chronic obstructive pulmonary disease (COPD) (HCC) Active Problems:   Acute renal failure superimposed on stage 3 chronic kidney disease (HCC)   Chronic systolic CHF (congestive heart failure) (HCC)   Biventricular implantable cardioverter-defibrillator in situ   Atrial fibrillation (HCC)   Coronary artery disease involving native coronary artery of native heart without angina pectoris   Paroxysmal  atrial fibrillation (HCC)   BPH (benign prostatic hyperplasia)   COPD exacerbation (HCC)   COPD with acute exacerbation (HCC)  #1 acute COPD exacerbation Questionable etiology. Patient presented on a 5 day history of worsening shortness of breath, wheezing, congestion, productive cough of whitish sputum, upper respiratory symptoms which have been worsening despite nebulizer treatments at home. Patient states he did get his influenza and Pneumovax. Will admit patient to telemetry. Will check an influenza PCR. Check a sputum Gram stain and culture. Repeat chest x-ray in the morning. Place on IV Solu-Medrol 80 mg every 6 hours, Mucinex, oxygen, Xopenex and Atrovent nebulizers, Pulmicort and Brovana, Claritin, Flonase, IV Levaquin. Follow.  #2 acute on chronic kidney disease stage III Likely a prerenal azotemia in the setting of diuretics and ARB. Creatinine at 2.37. Baseline creatinine approximate 1.5-1.8. Will hold diuretics and ARB. Neck a fractional excretion of sodium. Gentle hydration with IV fluids. Follow. If no significant improvement will need to get a renal ultrasound and consult with nephrology for further evaluation and management.  #3 paroxysmal atrial fibrillation Currently rate controlled. Continue Coreg and digoxin for rate control. Coumadin for anticoagulation.  #4 BPH Stable.  #5 gout Stable. Continue allopurinol.  #6 diabetes mellitus type 2 Check a hemoglobin A1c. Patient not on oral hypoglycemic agents. Place on sliding scale insulin as patient on steroids. Follow.  #7 coronary artery disease/chronic systolic heart failure/status post ICD Stable. Will hold diuretics and ARB secondary to acute on chronic kidney disease stage III. Will continue patient's Coreg, digoxin, aspirin, Lipitor.  #8 prophylaxis PPI for GI prophylaxis. Coumadin for DVT prophylaxis.   Code Status: DO NOT RESUSCITATE DVT Prophylaxis: On Coumadin. Family Communication: Updated patient. No family  at bedside. Disposition Plan: Admit to telemetry.  Time spent: 25 minutes  THOMPSON,DANIEL M.D. [facility] Pager [phone]

## 2015-03-06 ENCOUNTER — Inpatient Hospital Stay (HOSPITAL_COMMUNITY): Payer: Medicare HMO

## 2015-03-06 DIAGNOSIS — I482 Chronic atrial fibrillation: Secondary | ICD-10-CM

## 2015-03-06 LAB — GLUCOSE, CAPILLARY
GLUCOSE-CAPILLARY: 136 mg/dL — AB (ref 65–99)
Glucose-Capillary: 147 mg/dL — ABNORMAL HIGH (ref 65–99)
Glucose-Capillary: 180 mg/dL — ABNORMAL HIGH (ref 65–99)
Glucose-Capillary: 201 mg/dL — ABNORMAL HIGH (ref 65–99)

## 2015-03-06 LAB — BASIC METABOLIC PANEL
Anion gap: 11 (ref 5–15)
BUN: 51 mg/dL — AB (ref 6–20)
CALCIUM: 9.1 mg/dL (ref 8.9–10.3)
CO2: 25 mmol/L (ref 22–32)
CREATININE: 2.31 mg/dL — AB (ref 0.61–1.24)
Chloride: 106 mmol/L (ref 101–111)
GFR calc Af Amer: 30 mL/min — ABNORMAL LOW (ref >60)
GFR, EST NON AFRICAN AMERICAN: 26 mL/min — AB (ref >60)
GLUCOSE: 146 mg/dL — AB (ref 65–99)
Potassium: 3.8 mmol/L (ref 3.5–5.1)
Sodium: 142 mmol/L (ref 135–145)

## 2015-03-06 LAB — INFLUENZA PANEL BY PCR (TYPE A & B)
H1N1FLUPCR: NOT DETECTED
INFLAPCR: NEGATIVE
Influenza B By PCR: NEGATIVE

## 2015-03-06 LAB — PROTIME-INR
INR: 2.62 — AB (ref 0.00–1.49)
PROTHROMBIN TIME: 27.7 s — AB (ref 11.6–15.2)

## 2015-03-06 LAB — CBC
HEMATOCRIT: 36.7 % — AB (ref 39.0–52.0)
Hemoglobin: 12.3 g/dL — ABNORMAL LOW (ref 13.0–17.0)
MCH: 33.7 pg (ref 26.0–34.0)
MCHC: 33.5 g/dL (ref 30.0–36.0)
MCV: 100.5 fL — ABNORMAL HIGH (ref 78.0–100.0)
PLATELETS: 134 10*3/uL — AB (ref 150–400)
RBC: 3.65 MIL/uL — ABNORMAL LOW (ref 4.22–5.81)
RDW: 14.7 % (ref 11.5–15.5)
WBC: 12.4 10*3/uL — ABNORMAL HIGH (ref 4.0–10.5)

## 2015-03-06 LAB — URINE CULTURE: CULTURE: NO GROWTH

## 2015-03-06 MED ORDER — OSELTAMIVIR PHOSPHATE 30 MG PO CAPS
30.0000 mg | ORAL_CAPSULE | Freq: Every day | ORAL | Status: DC
  Administered 2015-03-07 – 2015-03-08 (×2): 30 mg via ORAL
  Filled 2015-03-06 (×3): qty 1

## 2015-03-06 MED ORDER — LEVOFLOXACIN IN D5W 250 MG/50ML IV SOLN
250.0000 mg | INTRAVENOUS | Status: DC
  Administered 2015-03-06: 250 mg via INTRAVENOUS
  Filled 2015-03-06: qty 50

## 2015-03-06 MED ORDER — MAGNESIUM SULFATE 4 GM/100ML IV SOLN
4.0000 g | Freq: Once | INTRAVENOUS | Status: AC
  Administered 2015-03-06: 4 g via INTRAVENOUS
  Filled 2015-03-06: qty 100

## 2015-03-06 MED ORDER — WARFARIN SODIUM 2 MG PO TABS
2.0000 mg | ORAL_TABLET | Freq: Once | ORAL | Status: AC
  Administered 2015-03-06: 2 mg via ORAL
  Filled 2015-03-06: qty 1

## 2015-03-06 MED ORDER — LEVALBUTEROL HCL 0.63 MG/3ML IN NEBU
0.6300 mg | INHALATION_SOLUTION | Freq: Three times a day (TID) | RESPIRATORY_TRACT | Status: DC
  Administered 2015-03-07: 0.63 mg via RESPIRATORY_TRACT
  Filled 2015-03-06: qty 3

## 2015-03-06 MED ORDER — IPRATROPIUM BROMIDE 0.02 % IN SOLN
0.5000 mg | Freq: Three times a day (TID) | RESPIRATORY_TRACT | Status: DC
Start: 2015-03-07 — End: 2015-03-07
  Administered 2015-03-07: 0.5 mg via RESPIRATORY_TRACT
  Filled 2015-03-06: qty 2.5

## 2015-03-06 NOTE — Evaluation (Signed)
Occupational Therapy Evaluation Patient Details Name: Troy [name] MRN: [id number] DOB: 01/27/1940 Today's Date: 03/06/2015    History of Present Illness Patient 75 year old male with history of coronary artery disease,  ischemic cardiomyopathy, hypertension, DVT, diabetes mellitus, atrial fibrillation, chronic systolic heart failure, COPD, ejection fraction was 20-25% with diffuse hypokinesis.  He has a Best boy.  Recent admission 12/25/14 with SOB. Pt now admitted 03/05/15 with COPD exacerbation, flu negative, RSV panel pending.    Clinical Impression   Patient evaluated by Occupational Therapy with no further acute OT needs identified. All education has been completed and the patient has no further questions. Pt requires supervision for ADLs and should progress quickly to mod I.  He is not interested in DME at this time.  See below for any follow-up Occupational Therapy or equipment needs. OT is signing off. Thank you for this referral.      Follow Up Recommendations  Supervision - Intermittent    Equipment Recommendations  None recommended by OT    Recommendations for Other Services       Precautions / Restrictions Precautions Precautions: Other (comment) Precaution Comments: monitor O2      Mobility Bed Mobility Overal bed mobility: Independent                Transfers Overall transfer level: Independent                    Balance Overall balance assessment: Independent                                          ADL                                         General ADL Comments: Pt is able to perform ADLs with supervision.  Pt became angry when asked to simulate shower in standing.   DOE at that time 3/4, but he would not allow me to take his 02 sat.  Pt reports he is independent, and he will continue to be independent, and "this is silly".  Pt calmed down, and later stated he is frustrated at being in  hospital and does worry about losing his independence.  At this time, however, he is not receptive to use of DME, or to other recommendations      Vision     Perception     Praxis      Pertinent Vitals/Pain Pain Assessment: No/denies pain     Hand Dominance Right   Extremity/Trunk Assessment Upper Extremity Assessment Upper Extremity Assessment: Overall WFL for tasks assessed   Lower Extremity Assessment Lower Extremity Assessment: Defer to PT evaluation   Cervical / Trunk Assessment Cervical / Trunk Assessment: Normal   Communication Communication Communication: No difficulties   Cognition Arousal/Alertness: Awake/alert Behavior During Therapy: WFL for tasks assessed/performed Overall Cognitive Status: Within Functional Limits for tasks assessed                     General Comments       Exercises       Shoulder Instructions      Home Living Family/patient expects to be discharged to:: Private residence Living Arrangements: Alone   Type of Home: Apartment Home Access: [place]  Layout: One level     Bathroom Shower/Tub: Occupational psychologist: Handicapped height     Home Equipment: Environmental consultant - 2 wheels;Cane - single point;Other (comment)          Prior Functioning/Environment Level of Independence: Independent with assistive device(s)        Comments: pt drives, uses SPC as needed, has home O2 but reports he doesn't use it    OT Diagnosis: Generalized weakness   OT Problem List:     OT Treatment/Interventions:      OT Goals(Current goals can be found in the care plan section) Acute Rehab OT Goals Patient Stated Goal: to go home  OT Goal Formulation: All assessment and education complete, DC therapy  OT Frequency:     Barriers to D/C:            Co-evaluation              End of Session    Activity Tolerance: Patient tolerated treatment well Patient left: in bed;with call bell/phone within reach    Time: 1720-1742 OT Time Calculation (min): 22 min Charges:  OT General Charges $OT Visit: 1 Procedure OT Evaluation $OT Eval Low Complexity: 1 Procedure G-Codes:    Virgia Kelner M 2015-03-15, 6:07 PM

## 2015-03-06 NOTE — Progress Notes (Signed)
  SATURATION QUALIFICATIONS: (This note is used to comply with regulatory documentation for home oxygen)  Patient Saturations on Room Air at Rest = 95%  Patient Saturations on Room Air while Ambulating = 85%  Will assess ambulating SaO2 with supplemental O2 next visit. Pt has O2 at home.    Blondell Reveal Kistler PT 03/06/2015  [phone]

## 2015-03-06 NOTE — Progress Notes (Signed)
[facility] PROGRESS NOTE  Troy [name] [id number] DOB: Nov 06, 1940 DOA: 03/05/2015 PCP: Barbette Merino, MD  Assessment/Plan: #1 acute COPD exacerbation Questionable etiology. Patient with clinical improvement. Influenza PCR is negative. RSV panel pending. Sputum Gram stain and culture pending. Repeat chest x-ray this morning was negative for any acute infiltrate. Continue IV Solu-Medrol, Mucinex, oxygen, Pulmicort and Brovana nebs, Xopenex and Atrovent nebs, Claritin, Flonase, IV Levaquin. Follow.  #2 acute on chronic kidney disease stage III Likely secondary to prerenal azotemia in the setting of diuretics and ARB. Creatinine today is 2.31 from 2.37 on admission. Baseline creatinine is 1.5-1.8. ARB and diuretics on hold. Fractional excretion of sodium is 2.30%. Increase IV fluids 100 mL/h. Check a renal ultrasound. Follow. If worsening or no significant improvement in the next 24 hours will consult with nephrology.  #3 paroxysmal atrial fibrillation Continue Coreg and digitoxin for rate control. Coumadin for anticoagulation.  #4 BPH Stable.  #5  Gout Stable. Continue allopurinol.  #6 diabetes mellitus type 2 CBGs of 147 this morning. Hemoglobin A1c pending. Sliding scale insulin.  #7 coronary artery disease/chronic systolic heart failure/status post ICD Stable. Diuretics and ARB on hold secondary to acute on chronic kidney disease stage III. Continue Coreg, digoxin, aspirin, Lipitor. Monitor closely with IV fluids.  #8 prophylaxis PPI for GI prophylaxis. Coumadin for DVT prophylaxis.  Code Status: DO NOT RESUSCITATE Family Communication: Updated patient. No family at bedside. Disposition Plan: Remain on telemetry. Home when respiratory status has improved and renal function back to baseline.   Consultants:  None  Procedures:  Chest x-ray 03/05/2015, 03/06/2015  Antibiotics:  IV Levaquin 03/05/2015  HPI/Subjective: Patient states he's feeling much better  than on admission. Improvement with shortness of breath. Patient denies chest pain.  Objective: Filed Vitals:   03/05/15 2013 03/06/15 0449  BP: 129/52 143/73  Pulse: 83 70  Temp: 97.3 F (36.3 C) 97.6 F (36.4 C)  Resp: 18 18    Intake/Output Summary (Last 24 hours) at 03/06/15 1110 Last data filed at 03/06/15 0700  Gross per 24 hour  Intake 1901.67 ml  Output    800 ml  Net 1101.67 ml   Filed Weights   03/05/15 2013 03/06/15 0449  Weight: 70.5 kg (155 lb 6.8 oz) 70.5 kg (155 lb 6.8 oz)    Exam:   General:  NAD  Cardiovascular: RRR  Respiratory: Fair air movement. Expiratory wheezing in the bases. No crackles.  Abdomen: Soft, nontender, nondistended, positive bowel sounds.  Musculoskeletal: No clubbing cyanosis or edema.  Data Reviewed: Basic Metabolic Panel:  Recent Labs Lab 03/05/15 1300 03/05/15 2142 03/06/15 0534  NA 138  --  142  K 4.2  --  3.8  CL 103  --  106  CO2 24  --  25  GLUCOSE 137*  --  146*  BUN 42*  --  51*  CREATININE 2.37*  --  2.31*  CALCIUM 9.4  --  9.1  MG  --  1.6*  --    Liver Function Tests: No results for input(s): AST, ALT, ALKPHOS, BILITOT, PROT, ALBUMIN in the last 168 hours. No results for input(s): LIPASE, AMYLASE in the last 168 hours. No results for input(s): AMMONIA in the last 168 hours. CBC:  Recent Labs Lab 03/05/15 1300 03/06/15 0534  WBC 14.8* 12.4*  NEUTROABS 12.5*  --   HGB 12.8* 12.3*  HCT 37.2* 36.7*  MCV 98.9 100.5*  PLT 130* 134*   Cardiac Enzymes: No results for input(s): CKTOTAL, CKMB, CKMBINDEX, TROPONINI in  the last 168 hours. BNP (last 3 results)  Recent Labs  12/25/14 0807 01/04/15 0825 03/05/15 1605  BNP 1078.9* 188.0* 220.7*    ProBNP (last 3 results)  Recent Labs  04/25/14 1205 05/02/14 1115  PROBNP 2433.0* 1499.0*    CBG:  Recent Labs Lab 03/05/15 2250 03/06/15 0800  GLUCAP 239* 147*    No results found for this or any previous visit (from the past 240  hour(s)).   Studies: X-ray Chest Pa And Lateral  03/06/2015  CLINICAL DATA:  Shortness of Breath EXAM: CHEST  2 VIEW COMPARISON:  03/05/2015 FINDINGS: Cardiac shadow is stable. A defibrillator is again noted. The lungs are well aerated bilaterally. No focal infiltrate or sizable effusion is seen. No acute bony abnormality is noted. IMPRESSION: No acute abnormality seen. Electronically Signed   By: Inez Catalina M.D.   On: 03/06/2015 09:02   Dg Chest 2 View  03/05/2015  CLINICAL DATA:  Cough congestion, weakness shortness of breath for 5 days. Worsening this morning. EXAM: CHEST  2 VIEW COMPARISON:  03/04/2015; 03/04/2014; 12/25/2014 FINDINGS: Grossly unchanged cardiac silhouette and mediastinal contours. Atherosclerotic plaque within the thoracic aorta. Stable position of support apparatus. The lungs remain hyperexpanded with flattening of the diaphragms. No focal airspace opacities. No pleural effusion pneumothorax. No evidence of edema. Old right-sided sixth rib fracture. No acute osseus abnormalities. IMPRESSION: Hyperexpanded lungs without acute cardiopulmonary disease. Electronically Signed   By: Sandi Mariscal M.D.   On: 03/05/2015 13:51   Dg Chest 2 View  03/04/2015  CLINICAL DATA:  75 year old male with cough and shortness of breath for 2 days. EXAM: CHEST  2 VIEW COMPARISON:  01/02/2015 and prior exams FINDINGS: Cardiomegaly and left-sided ICD/ pacemaker again noted. COPD/emphysema again noted. There is no evidence of focal airspace disease, pulmonary edema, suspicious pulmonary nodule/mass, pleural effusion, or pneumothorax. No acute bony abnormalities are identified. IMPRESSION: No evidence of acute cardiopulmonary disease. COPD/ emphysema and cardiomegaly. Electronically Signed   By: Margarette Canada M.D.   On: 03/04/2015 12:51    Scheduled Meds: . alfuzosin  10 mg Oral Q breakfast  . allopurinol  100 mg Oral Daily  . arformoterol  15 mcg Nebulization BID  . atorvastatin  10 mg Oral QHS  .  budesonide (PULMICORT) nebulizer solution  0.25 mg Nebulization BID  . carvedilol  12.5 mg Oral BID WC  . digoxin  0.0313 mg Oral Daily  . guaiFENesin  1,200 mg Oral BID  . hydrALAZINE  25 mg Oral TID  . insulin aspart  0-15 Units Subcutaneous TID WC  . ipratropium  0.5 mg Nebulization Q6H  . levalbuterol  0.63 mg Nebulization Q6H  . levofloxacin (LEVAQUIN) IV  500 mg Intravenous Q24H  . loratadine  10 mg Oral Daily  . magnesium sulfate 1 - 4 g bolus IVPB  4 g Intravenous Once  . methylPREDNISolone (SOLU-MEDROL) injection  80 mg Intravenous Q6H  . oseltamivir  30 mg Oral BID  . pantoprazole  40 mg Oral Daily  . sodium chloride flush  3 mL Intravenous Q12H  . Warfarin - Pharmacist Dosing Inpatient   Does not apply q1800   Continuous Infusions: . sodium chloride 100 mL/hr at 03/06/15 1008    Principal Problem:   Acute exacerbation of chronic obstructive pulmonary disease (COPD) ([place]) Active Problems:   Acute renal failure superimposed on stage 3 chronic kidney disease (HCC)   Chronic systolic CHF (congestive heart failure) (HCC)   Biventricular implantable cardioverter-defibrillator in situ   Atrial fibrillation (  [place])   Coronary artery disease involving native coronary artery of native heart without angina pectoris   Paroxysmal atrial fibrillation (HCC)   BPH (benign prostatic hyperplasia)   COPD exacerbation (HCC)   COPD with acute exacerbation ([place])    Time spent: [facility] Pager [phone]. If 7PM-7AM, please contact night-coverage at [email or web address], password [facility] 03/06/2015, 11:10 AM  LOS: 1 day

## 2015-03-06 NOTE — Evaluation (Signed)
Physical Therapy Evaluation Patient Details Name: Troy [name] MRN: [id number] DOB: 09-Feb-1940 Today's Date: 03/06/2015   History of Present Illness  Patient 75 year old male with history of coronary artery disease,  ischemic cardiomyopathy, hypertension, DVT, diabetes mellitus, atrial fibrillation, chronic systolic heart failure, COPD, ejection fraction was 20-25% with diffuse hypokinesis.  He has a Best boy.  Recent admission 12/25/14 with SOB. Pt now admitted 03/05/15 with COPD exacerbation, flu negative, RSV panel pending.   Clinical Impression  Pt admitted with above diagnosis. Pt currently with functional limitations due to the deficits listed below (see PT Problem List). Pt ambulated 275' with straight cane without loss of balance. SaO2 95% on RA at rest, 88-91% with walking on RA with brief drop to 85% at end of walk. Pt stated he has O2 at home but doesn't use it.  Pt will benefit from skilled PT to increase their independence and safety with mobility to allow discharge to the venue listed below.       Follow Up Recommendations No PT follow up    Equipment Recommendations  None recommended by PT    Recommendations for Other Services OT consult     Precautions / Restrictions Precautions Precautions: Other (comment) Precaution Comments: monitor O2 Restrictions Weight Bearing Restrictions: No      Mobility  Bed Mobility               General bed mobility comments: NT-up in recliner  Transfers Overall transfer level: Independent                  Ambulation/Gait Ambulation/Gait assistance: Supervision Ambulation Distance (Feet): 275 Feet Assistive device: Straight cane Gait Pattern/deviations: WFL(Within Functional Limits)   Gait velocity interpretation: at or above normal speed for age/gender General Gait Details: steady with SPC, SaO2 95% on RA at rest, 85-90% on RA with walking, 2/4 dyspnea  Stairs            Wheelchair Mobility     Modified Rankin (Stroke Patients Only)       Balance Overall balance assessment: Modified Independent                                           Pertinent Vitals/Pain Pain Assessment: No/denies pain    Home Living Family/patient expects to be discharged to:: Private residence Living Arrangements: Alone   Type of Home: Apartment Home Access: Elevator     Home Layout: One level Home Equipment: Environmental consultant - 2 wheels;Cane - single point;Other (comment) (oxygen)      Prior Function Level of Independence: Independent with assistive device(s)         Comments: pt drives, uses SPC as needed, has home O2 but reports he doesn't use it     Hand Dominance        Extremity/Trunk Assessment   Upper Extremity Assessment: Overall WFL for tasks assessed           Lower Extremity Assessment: Overall WFL for tasks assessed      Cervical / Trunk Assessment: Normal  Communication   Communication: No difficulties  Cognition Arousal/Alertness: Awake/alert Behavior During Therapy: WFL for tasks assessed/performed Overall Cognitive Status: Within Functional Limits for tasks assessed                      General Comments      Exercises  Assessment/Plan    PT Assessment Patient needs continued PT services  PT Diagnosis Generalized weakness   PT Problem List Cardiopulmonary status limiting activity;Decreased activity tolerance  PT Treatment Interventions Gait training;Functional mobility training;Therapeutic activities;Patient/family education;Therapeutic exercise;Balance training   PT Goals (Current goals can be found in the Care Plan section) Acute Rehab PT Goals Patient Stated Goal: likes to go fishing PT Goal Formulation: With patient Time For Goal Achievement: 03/20/15 Potential to Achieve Goals: Good    Frequency Min 3X/week   Barriers to discharge        Co-evaluation               End of Session Equipment  Utilized During Treatment: Gait belt Activity Tolerance: Patient tolerated treatment well Patient left: in chair;with call bell/phone within reach Nurse Communication: Mobility status         Time: 1340-1401 PT Time Calculation (min) (ACUTE ONLY): 21 min   Charges:   PT Evaluation $PT Eval Low Complexity: 1 Procedure     PT G CodesPhilomena Doheny 03/06/2015, 2:09 PM [phone]

## 2015-03-06 NOTE — Progress Notes (Signed)
ANTICOAGULATION CONSULT NOTE - Follow Up Consult  Pharmacy Consult for coumadin Indication: atrial fibrillation  Allergies  Allergen Reactions  . Benadryl [Diphenhydramine Hcl] Nausea And Vomiting    Patient Measurements: Height: 5\' 9"  (175.3 cm) Weight: 155 lb 6.8 oz (70.5 kg) IBW/kg (Calculated) : 70.7   Vital Signs: Temp: 97.6 F (36.4 C) (02/13 0449) Temp Source: Oral (02/13 0449) BP: 143/73 mmHg (02/13 0449) Pulse Rate: 70 (02/13 0449)  Labs:  Recent Labs  03/05/15 1300 03/05/15 1605 03/06/15 0534  HGB 12.8*  --  12.3*  HCT 37.2*  --  36.7*  PLT 130*  --  134*  LABPROT  --  26.0* 27.7*  INR  --  2.41* 2.62*  CREATININE 2.37*  --  2.31*    Estimated Creatinine Clearance: 27.6 mL/min (by C-G formula based on Cr of 2.31).   Medications:  Coumadin 2 mg daily except 3 mg on Wednesdays and Saturdays  Assessment: Patient's a 75 y.o M on coumadin PTA for Afib, presented to the ED on 1/12 with c/o cough and SOB.  To resume coumadin for hx Afib.  Today, 03/06/2015: - INR therapeutic at 2.62 - cbc relative stable - no bleeding documented - drug-drug intxns: levaquin - heart healthy diet  Goal of Therapy:  INR 2-3 Monitor platelets by anticoagulation protocol: Yes   Plan:  - coumadin 2mg  PO x1 today - monitor for s/s bleeding  Briseida Gittings P 03/06/2015,2:42 PM

## 2015-03-07 LAB — RENAL FUNCTION PANEL
ALBUMIN: 3 g/dL — AB (ref 3.5–5.0)
Anion gap: 10 (ref 5–15)
BUN: 53 mg/dL — AB (ref 6–20)
CALCIUM: 9 mg/dL (ref 8.9–10.3)
CO2: 24 mmol/L (ref 22–32)
CREATININE: 2.03 mg/dL — AB (ref 0.61–1.24)
Chloride: 109 mmol/L (ref 101–111)
GFR, EST AFRICAN AMERICAN: 35 mL/min — AB (ref >60)
GFR, EST NON AFRICAN AMERICAN: 30 mL/min — AB (ref >60)
Glucose, Bld: 147 mg/dL — ABNORMAL HIGH (ref 65–99)
PHOSPHORUS: 3.3 mg/dL (ref 2.5–4.6)
Potassium: 3.9 mmol/L (ref 3.5–5.1)
SODIUM: 143 mmol/L (ref 135–145)

## 2015-03-07 LAB — GLUCOSE, CAPILLARY
GLUCOSE-CAPILLARY: 142 mg/dL — AB (ref 65–99)
Glucose-Capillary: 227 mg/dL — ABNORMAL HIGH (ref 65–99)
Glucose-Capillary: 152 mg/dL — ABNORMAL HIGH (ref 65–99)
Glucose-Capillary: 157 mg/dL — ABNORMAL HIGH (ref 65–99)

## 2015-03-07 LAB — HEMOGLOBIN A1C
HEMOGLOBIN A1C: 6.7 % — AB (ref 4.8–5.6)
MEAN PLASMA GLUCOSE: 146 mg/dL

## 2015-03-07 LAB — CBC
HCT: 36.6 % — ABNORMAL LOW (ref 39.0–52.0)
Hemoglobin: 12.2 g/dL — ABNORMAL LOW (ref 13.0–17.0)
MCH: 33.8 pg (ref 26.0–34.0)
MCHC: 33.3 g/dL (ref 30.0–36.0)
MCV: 101.4 fL — AB (ref 78.0–100.0)
PLATELETS: 144 10*3/uL — AB (ref 150–400)
RBC: 3.61 MIL/uL — AB (ref 4.22–5.81)
RDW: 14.9 % (ref 11.5–15.5)
WBC: 19.7 10*3/uL — AB (ref 4.0–10.5)

## 2015-03-07 LAB — MAGNESIUM: MAGNESIUM: 2.5 mg/dL — AB (ref 1.7–2.4)

## 2015-03-07 LAB — PROTIME-INR
INR: 3.68 — AB (ref 0.00–1.49)
Prothrombin Time: 35.7 seconds — ABNORMAL HIGH (ref 11.6–15.2)

## 2015-03-07 MED ORDER — LEVALBUTEROL HCL 0.63 MG/3ML IN NEBU
0.6300 mg | INHALATION_SOLUTION | Freq: Three times a day (TID) | RESPIRATORY_TRACT | Status: DC
  Administered 2015-03-08 – 2015-03-11 (×12): 0.63 mg via RESPIRATORY_TRACT
  Filled 2015-03-07 (×11): qty 3

## 2015-03-07 MED ORDER — IPRATROPIUM BROMIDE 0.02 % IN SOLN
0.5000 mg | Freq: Two times a day (BID) | RESPIRATORY_TRACT | Status: DC
  Administered 2015-03-07: 0.5 mg via RESPIRATORY_TRACT
  Filled 2015-03-07: qty 2.5

## 2015-03-07 MED ORDER — METHYLPREDNISOLONE SODIUM SUCC 125 MG IJ SOLR
60.0000 mg | Freq: Two times a day (BID) | INTRAMUSCULAR | Status: DC
  Administered 2015-03-07 – 2015-03-08 (×2): 60 mg via INTRAVENOUS
  Filled 2015-03-07 (×2): qty 2

## 2015-03-07 MED ORDER — LEVALBUTEROL HCL 0.63 MG/3ML IN NEBU
0.6300 mg | INHALATION_SOLUTION | Freq: Two times a day (BID) | RESPIRATORY_TRACT | Status: DC
  Administered 2015-03-07: 0.63 mg via RESPIRATORY_TRACT
  Filled 2015-03-07: qty 3

## 2015-03-07 MED ORDER — AMOXICILLIN-POT CLAVULANATE 875-125 MG PO TABS
1.0000 | ORAL_TABLET | Freq: Two times a day (BID) | ORAL | Status: DC
  Administered 2015-03-07 – 2015-03-08 (×4): 1 via ORAL
  Filled 2015-03-07 (×5): qty 1

## 2015-03-07 NOTE — Progress Notes (Signed)
ANTICOAGULATION CONSULT NOTE - Follow Up Consult  Pharmacy Consult for coumadin Indication: atrial fibrillation  Allergies  Allergen Reactions  . Benadryl [Diphenhydramine Hcl] Nausea And Vomiting    Patient Measurements: Height: 5\' 9"  (175.3 cm) Weight: 164 lb 0.4 oz (74.4 kg) IBW/kg (Calculated) : 70.7   Vital Signs: Temp: 97.6 F (36.4 C) (02/14 0620) Temp Source: Oral (02/14 0620) BP: 134/59 mmHg (02/14 0620) Pulse Rate: 63 (02/14 0620)  Labs:  Recent Labs  03/05/15 1300 03/05/15 1605 03/06/15 0534 03/07/15 0526  HGB 12.8*  --  12.3* 12.2*  HCT 37.2*  --  36.7* 36.6*  PLT 130*  --  134* 144*  LABPROT  --  26.0* 27.7* 35.7*  INR  --  2.41* 2.62* 3.68*  CREATININE 2.37*  --  2.31* 2.03*    Estimated Creatinine Clearance: 31.4 mL/min (by C-G formula based on Cr of 2.03).   Medications:  Coumadin 2 mg daily except 3 mg on Wednesdays and Saturdays  Assessment: Patient's a 75 y.o M on coumadin PTA for Afib, presented to the ED on 1/12 with c/o cough and SOB.  To resume coumadin for hx Afib.  Today, 03/07/2015: - INR with large increase overnight.  Previously therapeutic, now supratherapeutic - Hgb sl low but stable, Plt low but improved - no bleeding documented - drug-drug intxns: levaquin - heart healthy diet, eating 100% of meals  Goal of Therapy:  INR 2-3 Monitor platelets by anticoagulation protocol: Yes   Plan:   Hold coumadin today  INR daily, CBC at least q72 hr while on coumadiin  monitor for s/s bleeding  Reuel Boom, PharmD, BCPS Pager: [phone] 03/07/2015, 12:50 PM

## 2015-03-07 NOTE — Progress Notes (Signed)
[facility] PROGRESS NOTE  AIREN MOTTE [id number] DOB: 24-Feb-1940 DOA: 03/05/2015 PCP: Barbette Merino, MD  Assessment/Plan: #1 acute COPD exacerbation Questionable etiology. Patient with clinical improvement. Influenza PCR is negative. RSV panel pending. Sputum Gram stain and culture pending. Repeat chest x-ray 03/06/2015, was negative for any acute infiltrate. Continue Mucinex, oxygen, Pulmicort and Brovana nebs, Xopenex and Atrovent nebs, Claritin, Flonase. Decrease IV Solu-Medrol to 60 mg every 12. Change IV Levaquin to oral Augmentin due to interaction with Coumadin. Follow.  #2 acute on chronic kidney disease stage III Likely secondary to prerenal azotemia in the setting of diuretics and ARB. Creatinine today is 2.03 from 2.31 from 2.37 on admission. Baseline creatinine is 1.5-1.8. ARB and diuretics on hold. Fractional excretion of sodium is 2.30%. Continue IV fluids 100 mL/h. Renal ultrasound negative for hydronephrosis. Follow.  #3 paroxysmal atrial fibrillation Continue Coreg and digoxin for rate control. Coumadin for anticoagulation. INR supratherapeutic likely interaction between Coumadin and Levaquin. Will change Levaquin to Augmentin.  #4 BPH Stable.  #5  Gout Stable. Continue allopurinol.  #6 diabetes mellitus type 2 CBGs of 142-227. Hemoglobin A1c is 6.7. Continue sliding scale insulin.   #7 coronary artery disease/chronic systolic heart failure/status post ICD Stable. Diuretics and ARB on hold secondary to acute on chronic kidney disease stage III. Continue Coreg, digoxin, aspirin, Lipitor. Monitor closely with IV fluids.  #8 prophylaxis PPI for GI prophylaxis. Coumadin for DVT prophylaxis.  Code Status: DO NOT RESUSCITATE Family Communication: Updated patient. No family at bedside. Disposition Plan: Remain on telemetry. Home when respiratory status has improved and renal function back to baseline.   Consultants:  None  Procedures:  Chest x-ray  03/05/2015, 03/06/2015  Renal ultrasound 03/06/2015  Antibiotics:  IV Levaquin 03/05/2015>>>>03/07/2015  Augmentin 03/07/2015  HPI/Subjective: Patient states feeling better. No CP. No SOB.   Objective: Filed Vitals:   03/06/15 2102 03/07/15 0620  BP: 126/60 134/59  Pulse: 70 63  Temp: 97.6 F (36.4 C) 97.6 F (36.4 C)  Resp: 18 20    Intake/Output Summary (Last 24 hours) at 03/07/15 1013 Last data filed at 03/07/15 0700  Gross per 24 hour  Intake 2731.67 ml  Output   1275 ml  Net 1456.67 ml   Filed Weights   03/05/15 2013 03/06/15 0449 03/07/15 0620  Weight: 70.5 kg (155 lb 6.8 oz) 70.5 kg (155 lb 6.8 oz) 74.4 kg (164 lb 0.4 oz)    Exam:   General:  NAD  Cardiovascular: RRR  Respiratory: Fair air movement. Minimal Expiratory wheezing in the bases. No crackles.  Abdomen: Soft, nontender, nondistended, positive bowel sounds.  Musculoskeletal: No clubbing cyanosis or edema.  Data Reviewed: Basic Metabolic Panel:  Recent Labs Lab 03/05/15 1300 03/05/15 2142 03/06/15 0534 03/07/15 0526  NA 138  --  142 143  K 4.2  --  3.8 3.9  CL 103  --  106 109  CO2 24  --  25 24  GLUCOSE 137*  --  146* 147*  BUN 42*  --  51* 53*  CREATININE 2.37*  --  2.31* 2.03*  CALCIUM 9.4  --  9.1 9.0  MG  --  1.6*  --  2.5*  PHOS  --   --   --  3.3   Liver Function Tests:  Recent Labs Lab 03/07/15 0526  ALBUMIN 3.0*   No results for input(s): LIPASE, AMYLASE in the last 168 hours. No results for input(s): AMMONIA in the last 168 hours. CBC:  Recent Labs Lab 03/05/15 1300  03/06/15 0534 03/07/15 0526  WBC 14.8* 12.4* 19.7*  NEUTROABS 12.5*  --   --   HGB 12.8* 12.3* 12.2*  HCT 37.2* 36.7* 36.6*  MCV 98.9 100.5* 101.4*  PLT 130* 134* 144*   Cardiac Enzymes: No results for input(s): CKTOTAL, CKMB, CKMBINDEX, TROPONINI in the last 168 hours. BNP (last 3 results)  Recent Labs  12/25/14 0807 01/04/15 0825 03/05/15 1605  BNP 1078.9* 188.0* 220.7*     ProBNP (last 3 results)  Recent Labs  04/25/14 1205 05/02/14 1115  PROBNP 2433.0* 1499.0*    CBG:  Recent Labs Lab 03/06/15 0800 03/06/15 1225 03/06/15 1820 03/06/15 2059 03/07/15 0745  GLUCAP 147* 180* 136* 201* 142*    Recent Results (from the past 240 hour(s))  Urine culture     Status: None   Collection Time: 03/05/15  1:45 PM  Result Value Ref Range Status   Specimen Description URINE, CLEAN CATCH  Final   Special Requests NONE  Final   Culture   Final    NO GROWTH 1 DAY Performed at [facility]    Report Status 03/06/2015 FINAL  Final     Studies: X-ray Chest Pa And Lateral  03/06/2015  CLINICAL DATA:  Shortness of Breath EXAM: CHEST  2 VIEW COMPARISON:  03/05/2015 FINDINGS: Cardiac shadow is stable. A defibrillator is again noted. The lungs are well aerated bilaterally. No focal infiltrate or sizable effusion is seen. No acute bony abnormality is noted. IMPRESSION: No acute abnormality seen. Electronically Signed   By: Inez Catalina M.D.   On: 03/06/2015 09:02   Dg Chest 2 View  03/05/2015  CLINICAL DATA:  Cough congestion, weakness shortness of breath for 5 days. Worsening this morning. EXAM: CHEST  2 VIEW COMPARISON:  03/04/2015; 03/04/2014; 12/25/2014 FINDINGS: Grossly unchanged cardiac silhouette and mediastinal contours. Atherosclerotic plaque within the thoracic aorta. Stable position of support apparatus. The lungs remain hyperexpanded with flattening of the diaphragms. No focal airspace opacities. No pleural effusion pneumothorax. No evidence of edema. Old right-sided sixth rib fracture. No acute osseus abnormalities. IMPRESSION: Hyperexpanded lungs without acute cardiopulmonary disease. Electronically Signed   By: Sandi Mariscal M.D.   On: 03/05/2015 13:51   US Renal  03/06/2015  CLINICAL DATA:  Acute on chronic renal failure EXAM: RENAL / URINARY TRACT ULTRASOUND COMPLETE COMPARISON:  None of a FINDINGS: Right Kidney: Length: 9.8 cm. Mild increased  cortical echogenicity and cortical thinning/ atrophy. No renal obstruction or hydronephrosis. No focal abnormality or mass. Left Kidney: Length: 10.5 cm. Mild cortical thinning but normal echogenicity. No acute process, hydronephrosis or focal abnormality. No mass evident. Bladder: Normal appearance for distension. Prostate is enlarged measuring 4.7 cm impressing upon the bladder base. IMPRESSION: Right renal increased echogenicity compatible with medical renal disease. No renal obstruction or hydronephrosis on either side. Enlarged prostate Electronically Signed   By: Jerilynn Mages.  Shick M.D.   On: 03/06/2015 21:31    Scheduled Meds: . alfuzosin  10 mg Oral Q breakfast  . allopurinol  100 mg Oral Daily  . arformoterol  15 mcg Nebulization BID  . atorvastatin  10 mg Oral QHS  . budesonide (PULMICORT) nebulizer solution  0.25 mg Nebulization BID  . carvedilol  12.5 mg Oral BID WC  . digoxin  0.0313 mg Oral Daily  . guaiFENesin  1,200 mg Oral BID  . hydrALAZINE  25 mg Oral TID  . insulin aspart  0-15 Units Subcutaneous TID WC  . ipratropium  0.5 mg Nebulization TID  . levalbuterol  0.63 mg Nebulization TID  . levofloxacin (LEVAQUIN) IV  250 mg Intravenous Q24H  . loratadine  10 mg Oral Daily  . methylPREDNISolone (SOLU-MEDROL) injection  80 mg Intravenous Q6H  . oseltamivir  30 mg Oral Daily  . pantoprazole  40 mg Oral Daily  . sodium chloride flush  3 mL Intravenous Q12H  . Warfarin - Pharmacist Dosing Inpatient   Does not apply q1800   Continuous Infusions: . sodium chloride 100 mL/hr at 03/07/15 0620    Principal Problem:   Acute exacerbation of chronic obstructive pulmonary disease (COPD) ([place]) Active Problems:   Acute renal failure superimposed on stage 3 chronic kidney disease (HCC)   Chronic systolic CHF (congestive heart failure) (HCC)   Biventricular implantable cardioverter-defibrillator in situ   Atrial fibrillation (HCC)   Coronary artery disease involving native coronary artery of  native heart without angina pectoris   Paroxysmal atrial fibrillation (HCC)   BPH (benign prostatic hyperplasia)   COPD exacerbation ([place])   COPD with acute exacerbation ([place])    Time spent: [facility] Pager [phone]. If 7PM-7AM, please contact night-coverage at [email or web address], password [email or web address] 03/07/2015, 10:13 AM  LOS: 2 days

## 2015-03-08 DIAGNOSIS — N183 Chronic kidney disease, stage 3 (moderate): Secondary | ICD-10-CM

## 2015-03-08 DIAGNOSIS — J441 Chronic obstructive pulmonary disease with (acute) exacerbation: Principal | ICD-10-CM

## 2015-03-08 DIAGNOSIS — I480 Paroxysmal atrial fibrillation: Secondary | ICD-10-CM

## 2015-03-08 DIAGNOSIS — J9601 Acute respiratory failure with hypoxia: Secondary | ICD-10-CM

## 2015-03-08 DIAGNOSIS — N179 Acute kidney failure, unspecified: Secondary | ICD-10-CM

## 2015-03-08 LAB — BASIC METABOLIC PANEL
Anion gap: 7 (ref 5–15)
BUN: 53 mg/dL — AB (ref 6–20)
CALCIUM: 8.5 mg/dL — AB (ref 8.9–10.3)
CO2: 23 mmol/L (ref 22–32)
Chloride: 110 mmol/L (ref 101–111)
Creatinine, Ser: 1.92 mg/dL — ABNORMAL HIGH (ref 0.61–1.24)
GFR calc Af Amer: 38 mL/min — ABNORMAL LOW (ref >60)
GFR, EST NON AFRICAN AMERICAN: 32 mL/min — AB (ref >60)
GLUCOSE: 173 mg/dL — AB (ref 65–99)
POTASSIUM: 3.8 mmol/L (ref 3.5–5.1)
Sodium: 140 mmol/L (ref 135–145)

## 2015-03-08 LAB — RESPIRATORY VIRUS PANEL
Adenovirus: NEGATIVE
INFLUENZA A: NEGATIVE
INFLUENZA B 1: NEGATIVE
Metapneumovirus: NEGATIVE
PARAINFLUENZA 2 A: NEGATIVE
PARAINFLUENZA 3 A: NEGATIVE
Parainfluenza 1: NEGATIVE
Respiratory Syncytial Virus A: POSITIVE — AB
Respiratory Syncytial Virus B: NEGATIVE
Rhinovirus: NEGATIVE

## 2015-03-08 LAB — PROTIME-INR
INR: 4.19 — ABNORMAL HIGH (ref 0.00–1.49)
Prothrombin Time: 39.3 seconds — ABNORMAL HIGH (ref 11.6–15.2)

## 2015-03-08 LAB — CBC
HEMATOCRIT: 34.9 % — AB (ref 39.0–52.0)
Hemoglobin: 11.7 g/dL — ABNORMAL LOW (ref 13.0–17.0)
MCH: 34 pg (ref 26.0–34.0)
MCHC: 33.5 g/dL (ref 30.0–36.0)
MCV: 101.5 fL — AB (ref 78.0–100.0)
PLATELETS: 140 10*3/uL — AB (ref 150–400)
RBC: 3.44 MIL/uL — ABNORMAL LOW (ref 4.22–5.81)
RDW: 15 % (ref 11.5–15.5)
WBC: 17.7 10*3/uL — ABNORMAL HIGH (ref 4.0–10.5)

## 2015-03-08 LAB — GLUCOSE, CAPILLARY
Glucose-Capillary: 168 mg/dL — ABNORMAL HIGH (ref 65–99)
Glucose-Capillary: 187 mg/dL — ABNORMAL HIGH (ref 65–99)
Glucose-Capillary: 164 mg/dL — ABNORMAL HIGH (ref 65–99)
Glucose-Capillary: 142 mg/dL — ABNORMAL HIGH (ref 65–99)

## 2015-03-08 MED ORDER — METHYLPREDNISOLONE SODIUM SUCC 125 MG IJ SOLR: 60.0000 mg | INTRAMUSCULAR | Status: DC

## 2015-03-08 NOTE — Progress Notes (Addendum)
Patient ID: Troy [name], male   DOB: 25-Sep-1940, 75 y.o.   MRN: [id number] [facility] PROGRESS NOTE  FUTURE MONRROY [id number] DOB: [birth year] DOA: 03/05/2015 PCP: Barbette Merino, MD  Brief narrative:    75 y.o. male with past medical history of CAD status post PCI to RCA, chronic atrial fibrillation on chronic anticoagulation with coumadin, hypertension, gastroesophageal reflux disease, type 2 diabetes, history of CHF, gout who presented to [facility] ED with a 5 day history of upper respiratory symptoms of productive cough of whitish sputum, congestion, rhinorrhea, worsening shortness of breath, chest tightness, wheezing. Patient was seen at [facility] emergency room 1 day prior to admission with similar symptoms, was prescribed doxycycline and prednisone and discharged.   In the ED, pt was hemodynamically stable. No acute findings on CXR. INR was 2.41. WBC count was 14.8, Cr 2.37. He was given IV solumedrol, continuous nebs and due to ongoing shortness of breath he was admitted for further evaluation.   Assessment/Plan:    Principal Problem: Acute respiratory failure with hypoxia / acute COPD exacerbation - Influenza PCR is negative.  - Resp viral panel pending  - Sputum Gram stain and culture pending.  - Repeat chest x-ray 03/06/2015 negative for acute infiltrate.  - Continue current nebs - Continue empiric Augmentin and tamiflu until RSV panel result is back - Taper down solumedrol (daily instead of Q 12 hours)  Active Problems: Acute on chronic kidney disease stage III - Likely in the setting of diuretics and ARB.  - ARB and diuretics on hold.  - Cr now better, 1.9 this am  Paroxysmal atrial fibrillation - CHADS vasc score 4 - Rate controlled with coreg and digoxin  - On AC with coumadin   Diabetes mellitus type 2 without complications without long term insulin use - Hemoglobin A1c is 6.7.  - Continue sliding scale insulin.   Coronary artery disease/chronic  systolic heart failure/status post ICD - Compensated   DVT Prophylaxis  - SCD's bilaterally and coumadin    Code Status: DNR/DNI Family Communication:  plan of care discussed with the patient Disposition Plan: Home by 03/10/2015   IV access:  Peripheral IV  Procedures and diagnostic studies:    X-ray Chest Pa And Lateral 03/06/2015  No acute abnormality seen. Electronically Signed   By: Inez Catalina M.D.   On: 03/06/2015 09:02   Dg Chest 2 View 03/05/2015   Hyperexpanded lungs without acute cardiopulmonary disease. Electronically Signed   By: Sandi Mariscal M.D.   On: 03/05/2015 13:51   US Renal 03/06/2015 Right renal increased echogenicity compatible with medical renal disease. No renal obstruction or hydronephrosis on either side. Enlarged prostate   Medical Consultants:  None   Other Consultants:  PT  IAnti-Infectives:   Levaquin 03/05/2015 --> 03/07/2015 Augmentin 03/07/2015 --> Oseltamivir    Leisa Lenz, MD  [facility] Pager [phone]  Time spent in minutes: 25 minutes  If 7PM-7AM, please contact night-coverage [email or web address] Password [facility] 03/08/2015, 4:47 PM   LOS: 3 days    HPI/Subjective: No acute overnight events. Patient reports coughing but otherwise feels better.   Objective: Filed Vitals:   03/08/15 0820 03/08/15 0839 03/08/15 1340 03/08/15 1554  BP: 112/74  126/58 125/73  Pulse: 66  104   Temp:   97.6 F (36.4 C)   TempSrc:   Oral   Resp:   18   Height:      Weight:      SpO2:  96% 96%     Intake/Output  Summary (Last 24 hours) at 03/08/15 1647 Last data filed at 03/08/15 1216  Gross per 24 hour  Intake   1740 ml  Output    850 ml  Net    890 ml    Exam:   General:  Pt is alert, follows commands appropriately, not in acute distress  Cardiovascular: Regular rate and rhythm, S1/S2 (+)  Respiratory: rhonchorous, no wheezing   Abdomen: Soft, non tender, non distended, bowel sounds present  Extremities: No edema, pulses palpable  bilaterally  Neuro: Grossly nonfocal  Data Reviewed: Basic Metabolic Panel:  Recent Labs Lab 03/05/15 1300 03/05/15 2142 03/06/15 0534 03/07/15 0526 03/08/15 0459  NA 138  --  142 143 140  K 4.2  --  3.8 3.9 3.8  CL 103  --  106 109 110  CO2 24  --  25 24 23   GLUCOSE 137*  --  146* 147* 173*  BUN 42*  --  51* 53* 53*  CREATININE 2.37*  --  2.31* 2.03* 1.92*  CALCIUM 9.4  --  9.1 9.0 8.5*  MG  --  1.6*  --  2.5*  --   PHOS  --   --   --  3.3  --    Liver Function Tests:  Recent Labs Lab 03/07/15 0526  ALBUMIN 3.0*   No results for input(s): LIPASE, AMYLASE in the last 168 hours. No results for input(s): AMMONIA in the last 168 hours. CBC:  Recent Labs Lab 03/05/15 1300 03/06/15 0534 03/07/15 0526 03/08/15 0459  WBC 14.8* 12.4* 19.7* 17.7*  NEUTROABS 12.5*  --   --   --   HGB 12.8* 12.3* 12.2* 11.7*  HCT 37.2* 36.7* 36.6* 34.9*  MCV 98.9 100.5* 101.4* 101.5*  PLT 130* 134* 144* 140*   Cardiac Enzymes: No results for input(s): CKTOTAL, CKMB, CKMBINDEX, TROPONINI in the last 168 hours. BNP: Invalid input(s): POCBNP CBG:  Recent Labs Lab 03/07/15 1212 03/07/15 1704 03/07/15 2208 03/08/15 0753 03/08/15 1214  GLUCAP 227* 152* 157* 168* 187*    Recent Results (from the past 240 hour(s))  Urine culture     Status: None   Collection Time: 03/05/15  1:45 PM  Result Value Ref Range Status   Specimen Description URINE, CLEAN CATCH  Final   Special Requests NONE  Final   Culture   Final    NO GROWTH 1 DAY Performed at [facility]    Report Status 03/06/2015 FINAL  Final     Scheduled Meds: . alfuzosin  10 mg Oral Q breakfast  . allopurinol  100 mg Oral Daily  . amoxicillin-clavulanate  1 tablet Oral Q12H  . arformoterol  15 mcg Nebulization BID  . atorvastatin  10 mg Oral QHS  . budesonide (PULMICORT) nebulizer solution  0.25 mg Nebulization BID  . carvedilol  12.5 mg Oral BID WC  . digoxin  0.0313 mg Oral Daily  . guaiFENesin  1,200 mg  Oral BID  . hydrALAZINE  25 mg Oral TID  . insulin aspart  0-15 Units Subcutaneous TID WC  . levalbuterol  0.63 mg Nebulization TID  . loratadine  10 mg Oral Daily  . methylPREDNISolone (SOLU-MEDROL) injection  60 mg Intravenous Q12H  . oseltamivir  30 mg Oral Daily  . pantoprazole  40 mg Oral Daily  . sodium chloride flush  3 mL Intravenous Q12H  . Warfarin - Pharmacist Dosing Inpatient   Does not apply q1800   Continuous Infusions: . sodium chloride 100 mL/hr at 03/08/15  1338         

## 2015-03-08 NOTE — Progress Notes (Signed)
ANTICOAGULATION CONSULT NOTE - Follow Up Consult  Pharmacy Consult for coumadin Indication: atrial fibrillation  Allergies  Allergen Reactions  . Benadryl [Diphenhydramine Hcl] Nausea And Vomiting    Patient Measurements: Height: 5\' 9"  (175.3 cm) Weight: 167 lb 1.7 oz (75.8 kg) IBW/kg (Calculated) : 70.7   Vital Signs: Temp: 97.4 F (36.3 C) (02/15 0539) Temp Source: Axillary (02/15 0539) BP: 138/49 mmHg (02/15 0539) Pulse Rate: 65 (02/15 0539)  Labs:  Recent Labs  03/06/15 0534 03/07/15 0526 03/08/15 0459  HGB 12.3* 12.2* 11.7*  HCT 36.7* 36.6* 34.9*  PLT 134* 144* 140*  LABPROT 27.7* 35.7* 39.3*  INR 2.62* 3.68* 4.19*  CREATININE 2.31* 2.03* 1.92*    Estimated Creatinine Clearance: 33.2 mL/min (by C-G formula based on Cr of 1.92).   Medications:  Coumadin 2 mg daily except 3 mg on Wednesdays and Saturdays  Assessment: Patient's a 75 y.o M on coumadin PTA for Afib, presented to the ED on 1/12 with c/o cough and SOB.  To resume coumadin for hx Afib.  Today, 03/08/2015: - INR with large increase again overnight, now grossly supratherapeutic after warfarin x 2 - Hgb continues to drop slowly but still acceptable, Plt stable around 140 - no bleeding documented - drug-drug intxns: levaquin x 2 doses, changed to Augmentin - heart healthy diet, eating 25% of meals  Goal of Therapy:  INR 2-3 Monitor platelets by anticoagulation protocol: Yes   Plan:   Continue to hold coumadin today.  Doubt that this increase in INR is solely d/t Levaquin interaction, suspect high dose steroids, decr PO intake, acute illness.  INR daily, CBC at least q72 hr while on coumadiin  monitor for s/s bleeding  Reuel Boom, PharmD, BCPS Pager: [phone] 03/08/2015, 8:23 AM

## 2015-03-08 NOTE — Care Management Important Message (Signed)
Important Message  Patient Details IM Letter given to Cookie/Case Manager to present to Patient Name: Troy [name] MRN: [id number] Date of Birth: 28-Jun-1940   Medicare Important Message Given:  Yes    Camillo Flaming 03/08/2015, 11:31 AMImportant Message  Patient Details  Name: Troy [name] MRN: [id number] Date of Birth: April 19, 1940   Medicare Important Message Given:  Yes    Camillo Flaming 03/08/2015, 11:31 AM

## 2015-03-09 LAB — PROTIME-INR
INR: 3.88 — AB (ref 0.00–1.49)
PROTHROMBIN TIME: 37.1 s — AB (ref 11.6–15.2)

## 2015-03-09 LAB — BASIC METABOLIC PANEL
Anion gap: 4 — ABNORMAL LOW (ref 5–15)
BUN: 52 mg/dL — AB (ref 6–20)
CHLORIDE: 111 mmol/L (ref 101–111)
CO2: 23 mmol/L (ref 22–32)
CREATININE: 1.68 mg/dL — AB (ref 0.61–1.24)
Calcium: 8.2 mg/dL — ABNORMAL LOW (ref 8.9–10.3)
GFR calc non Af Amer: 38 mL/min — ABNORMAL LOW (ref >60)
GFR, EST AFRICAN AMERICAN: 44 mL/min — AB (ref >60)
GLUCOSE: 148 mg/dL — AB (ref 65–99)
Potassium: 3.8 mmol/L (ref 3.5–5.1)
SODIUM: 138 mmol/L (ref 135–145)

## 2015-03-09 LAB — GLUCOSE, CAPILLARY
Glucose-Capillary: 129 mg/dL — ABNORMAL HIGH (ref 65–99)
Glucose-Capillary: 169 mg/dL — ABNORMAL HIGH (ref 65–99)
Glucose-Capillary: 132 mg/dL — ABNORMAL HIGH (ref 65–99)
Glucose-Capillary: 105 mg/dL — ABNORMAL HIGH (ref 65–99)

## 2015-03-09 MED ORDER — PREDNISONE 5 MG PO TABS: 10.0000 mg | ORAL_TABLET | Freq: Every day | ORAL | Status: DC

## 2015-03-09 MED ORDER — PREDNISONE 50 MG PO TABS
50.0000 mg | ORAL_TABLET | Freq: Every day | ORAL | Status: DC
  Administered 2015-03-10: 50 mg via ORAL
  Filled 2015-03-09: qty 1

## 2015-03-09 MED ORDER — LEVALBUTEROL HCL 0.63 MG/3ML IN NEBU: 0.6300 mg | INHALATION_SOLUTION | Freq: Four times a day (QID) | RESPIRATORY_TRACT | Status: DC | PRN

## 2015-03-09 MED ORDER — GUAIFENESIN 100 MG/5ML PO SOLN: 5.0000 mL | Freq: Four times a day (QID) | ORAL | Status: DC | PRN

## 2015-03-09 MED ORDER — DIGOXIN 0.05 MG/ML PO SOLN: 0.0313 mg | Freq: Every day | ORAL | Status: DC

## 2015-03-09 MED ORDER — GUAIFENESIN 100 MG/5ML PO SOLN
5.0000 mL | Freq: Four times a day (QID) | ORAL | Status: DC | PRN
  Administered 2015-03-09 – 2015-03-15 (×6): 100 mg via ORAL
  Filled 2015-03-09 (×5): qty 10

## 2015-03-09 MED ORDER — SENNOSIDES-DOCUSATE SODIUM 8.6-50 MG PO TABS: 1.0000 | ORAL_TABLET | Freq: Every evening | ORAL | Status: DC | PRN

## 2015-03-09 NOTE — Progress Notes (Signed)
Physical Therapy Treatment Patient Details Name: QUINNTON SCHILZ MRN: [id number] DOB: 01/29/1940 Today's Date: 03/09/2015    History of Present Illness Patient 75 year old male with history of coronary artery disease,  ischemic cardiomyopathy, hypertension, DVT, diabetes mellitus, atrial fibrillation, chronic systolic heart failure, COPD, ejection fraction was 20-25% with diffuse hypokinesis.  He has a Best boy.  Recent admission 12/25/14 with SOB. Pt now admitted 03/05/15 with COPD exacerbation, flu negative, RSV panel pending.     PT Comments    Pt reports not feeling as well today as previously however pleasant and agreeable to ambulate.  Pt required supplemental oxygen.  SATURATION QUALIFICATIONS: (This note is used to comply with regulatory documentation for home oxygen)  Patient Saturations on Room Air at Rest = 95%  Patient Saturations on Room Air while Ambulating = 79%  Patient Saturations on 3 Liters of oxygen while Ambulating = 99%  Please briefly explain why patient needs home oxygen: to maintain oxygen saturations above 88% during physical activity such as ambulation.   Follow Up Recommendations  No PT follow up     Equipment Recommendations  None recommended by PT    Recommendations for Other Services       Precautions / Restrictions Precautions Precautions: Fall Precaution Comments: monitor O2    Mobility  Bed Mobility               General bed mobility comments: pt up in recliner  Transfers Overall transfer level: Needs assistance Equipment used: Rolling walker (2 wheeled) Transfers: Sit to/from Stand Sit to Stand: Min guard         General transfer comment: pt reports not feeling well today, min/guard for safety and lines  Ambulation/Gait Ambulation/Gait assistance: Min guard Ambulation Distance (Feet): 200 Feet Assistive device: Rolling walker (2 wheeled) Gait Pattern/deviations: Step-through pattern;Decreased stride length      General Gait Details: verbal cues for RW positioning, SpO2 dropped to 79% on room so applied 3L O2, a couple short standing rest breaks for breathing   Stairs            Wheelchair Mobility    Modified Rankin (Stroke Patients Only)       Balance                                    Cognition Arousal/Alertness: Awake/alert Behavior During Therapy: WFL for tasks assessed/performed Overall Cognitive Status: Within Functional Limits for tasks assessed                      Exercises      General Comments        Pertinent Vitals/Pain Pain Assessment: No/denies pain    Home Living                      Prior Function            PT Goals (current goals can now be found in the care plan section) Progress towards PT goals: Progressing toward goals    Frequency  Min 3X/week    PT Plan Current plan remains appropriate    Co-evaluation             End of Session Equipment Utilized During Treatment: Oxygen Activity Tolerance: Patient tolerated treatment well Patient left: in chair;with call bell/phone within reach;with family/visitor present     Time: 1055-1110 PT Time Calculation (min) (ACUTE ONLY):  15 min  Charges:  $Gait Training: 8-22 mins                    G Codes:      Tyric Rodeheaver,KATHrine E 03-14-15, 1:18 PM Carmelia Bake, PT, DPT Mar 14, 2015 Pager: [phone]

## 2015-03-09 NOTE — Discharge Summary (Addendum)
Physician Discharge Summary  PACEY KOVAC [id number] DOB: July 09, 1940 DOA: 03/05/2015  PCP: Barbette Merino, MD  Admit date: 03/05/2015 Discharge date: 03/09/2015  Recommendations for Outpatient Follow-up:  1. Abx not needed on discharge  2. Held lasix and losartan due t renal insufficiency. Resume once Cr within normal limits  3. 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) ([place]) Active Problems:   Chronic systolic CHF (congestive heart failure) (HCC)   Biventricular implantable cardioverter-defibrillator in situ   Atrial fibrillation (HCC)   Coronary artery disease involving native coronary artery of native heart without angina pectoris   Paroxysmal atrial fibrillation (HCC)   BPH (benign prostatic hyperplasia)   COPD exacerbation (HCC)   Acute renal failure superimposed on stage 3 chronic kidney disease (HCC)   COPD with acute exacerbation ([place])    Discharge Condition: stable   Diet recommendation: as tolerated   History of present illness:  75 y.o. male with past medical history of CAD status post PCI to RCA, chronic atrial fibrillation on chronic anticoagulation with coumadin, hypertension, gastroesophageal reflux disease, type 2 diabetes, history of CHF, gout who presented to [facility] ED with a 5 day history of upper respiratory symptoms of productive cough of whitish sputum, congestion, rhinorrhea, worsening shortness of breath, chest tightness, wheezing. Patient was seen at [facility] emergency room 1 day prior to admission with similar symptoms, was prescribed doxycycline and prednisone and discharged.   In the ED, pt was hemodynamically stable. No acute findings on CXR. INR was 2.41. WBC count was 14.8, Cr 2.37. He was given IV solumedrol, continuous nebs and due to ongoing shortness of breath he was admitted for further evaluation.   Hospital Course:   Assessment/Plan:     Principal Problem: Acute respiratory failure with hypoxia / acute COPD exacerbation - Influenza PCR is negative.  - RSV (+) - Chest x-ray 03/06/2015 negative for acute infiltrate.  - Stopped Augmentin and tamiflu 2/16 - Stop prednisone 2/17  Active Problems: Acute on chronic kidney disease stage III - Likely in the setting of diuretics and ARB.  - ARB and diuretics on hold.  - Check BMP today   Paroxysmal atrial fibrillation - CHADS vasc score 4 - Rate controlled with coreg and digoxin  - Coumadin was on hold due to supratherapeutic iNR  Diabetes mellitus type 2 without complications without long term insulin use - Hemoglobin A1c is 6.7.  - No need for antihyperglycemic on discharge   Coronary artery disease/chronic systolic heart failure/status post ICD - Compensated   DVT Prophylaxis  - SCD's bilaterally  - Coumadin was on hold due to supratherapeutic INR    Code Status: DNR/DNI Family Communication: plan of care discussed with the patient   IV access:  Peripheral IV  Procedures and diagnostic studies:   X-ray Chest Pa And Lateral 03/06/2015 No acute abnormality seen. Electronically Signed By: Inez Catalina M.D. On: 03/06/2015 09:02   Dg Chest 2 View 03/05/2015 Hyperexpanded lungs without acute cardiopulmonary disease. Electronically Signed By: Sandi Mariscal M.D. On: 03/05/2015 13:51   US Renal 03/06/2015 Right renal increased echogenicity compatible with medical renal disease. No renal obstruction or hydronephrosis on either side. Enlarged prostate   Medical Consultants:  None   Other Consultants:  PT  IAnti-Infectives:   Levaquin 03/05/2015 --> 03/07/2015 Augmentin 03/07/2015 --> 03/09/15 Oseltamivir - Stopped 03/09/2015  Signed:  Leisa Lenz, MD  [facility] 03/09/2015, 5:56 PM  Pager #: [phone]  Time spent  in minutes: more than 30 minutes  Discharge Exam: Filed Vitals:   03/09/15 1340 03/09/15 1615   BP: 134/56 104/85  Pulse: 68 72  Temp: 97.5 F (36.4 C)   Resp: 18    Filed Vitals:   03/09/15 1037 03/09/15 1340 03/09/15 1503 03/09/15 1615  BP:  134/56  104/85  Pulse:  68  72  Temp:  97.5 F (36.4 C)    TempSrc:  Oral    Resp:  18    Height:      Weight:      SpO2: 95% 98% 97%     General: Pt is alert, follows commands appropriately, not in acute distress Cardiovascular: Regular rate and rhythm, S1/S2 (+) Respiratory: Clear to auscultation bilaterally, no wheezing, no crackles, no rhonchi Abdominal: Soft, non tender, non distended, bowel sounds +, no guarding Extremities: no edema, no cyanosis, pulses palpable bilaterally DP and PT Neuro: Grossly nonfocal  Discharge Instructions  Discharge Instructions    Call MD for:  difficulty breathing, headache or visual disturbances    Complete by:  As directed      Call MD for:  persistant dizziness or light-headedness    Complete by:  As directed      Call MD for:  persistant nausea and vomiting    Complete by:  As directed      Call MD for:  severe uncontrolled pain    Complete by:  As directed      Diet - low sodium heart healthy    Complete by:  As directed      Increase activity slowly    Complete by:  As directed             Medication List    STOP taking these medications        Digoxin 62.5 MCG Tabs  Replaced by:  digoxin 0.05 MG/ML solution     doxycycline 100 MG capsule  Commonly known as:  VIBRAMYCIN     furosemide 40 MG tablet  Commonly known as:  LASIX     guaiFENesin-dextromethorphan 100-10 MG/5ML syrup  Commonly known as:  ROBITUSSIN DM     losartan 50 MG tablet  Commonly known as:  COZAAR     potassium chloride SA 20 MEQ tablet  Commonly known as:  K-DUR,KLOR-CON     predniSONE 20 MG tablet  Commonly known as:  DELTASONE     warfarin 1 MG tablet  Commonly known as:  COUMADIN      TAKE these medications        acetaminophen 500 MG tablet  Commonly known as:  TYLENOL  Take 500 mg  by mouth every 6 (six) hours as needed for mild pain or moderate pain.     alfuzosin 10 MG 24 hr tablet  Commonly known as:  UROXATRAL  Take 10 mg by mouth daily with breakfast.     allopurinol 100 MG tablet  Commonly known as:  ZYLOPRIM  Take 100 mg by mouth daily.     atorvastatin 10 MG tablet  Commonly known as:  LIPITOR  Take 1 tablet (10 mg total) by mouth at bedtime.     carvedilol 12.5 MG tablet  Commonly known as:  COREG  Take 1 tablet (12.5 mg total) by mouth 2 (two) times daily with a meal.     digoxin 0.05 MG/ML solution  Commonly known as:  LANOXIN  Take 0.6 mLs (0.03 mg total) by mouth daily.     fluticasone 50 MCG/ACT  nasal spray  Commonly known as:  FLONASE  Place 2 sprays into both nostrils daily as needed for allergies.     guaiFENesin 100 MG/5ML Soln  Commonly known as:  ROBITUSSIN  Take 5 mLs (100 mg total) by mouth every 6 (six) hours as needed for cough or to loosen phlegm.     hydrALAZINE 25 MG tablet  Commonly known as:  APRESOLINE  TAKE ONE TABLET BY MOUTH THREE TIMES DAILY.     levalbuterol 0.63 MG/3ML nebulizer solution  Commonly known as:  XOPENEX  Take 3 mLs (0.63 mg total) by nebulization every 6 (six) hours as needed for wheezing or shortness of breath.     loratadine 10 MG tablet  Commonly known as:  CLARITIN  Take 10 mg by mouth daily. For allergies     mometasone-formoterol 100-5 MCG/ACT Aero  Commonly known as:  DULERA  Inhale 2 puffs into the lungs 2 (two) times daily.     omeprazole 40 MG capsule  Commonly known as:  PRILOSEC  Take 40 mg by mouth at bedtime.     PROAIR HFA 108 (90 Base) MCG/ACT inhaler  Generic drug:  albuterol  INHALE 2 PUFFS EVERY 4 HOURS AS NEEDED FOR WHEEZING OR SHORTNESS OF BREATH.     senna-docusate 8.6-50 MG tablet  Commonly known as:  Senokot-S  Take 1 tablet by mouth at bedtime as needed for mild constipation.     spironolactone 25 MG tablet  Commonly known as:  ALDACTONE  Take 1 tablet (25 mg  total) by mouth daily.           Follow-up Information    Follow up with [facility], MD. Schedule an appointment as soon as possible for a visit in 2 weeks.   Specialty:  Internal Medicine   Why:  Follow up appt after recent hospitalization   Contact information:   [address] [place] [address] [phone]        The results of significant diagnostics from this hospitalization (including imaging, microbiology, ancillary and laboratory) are listed below for reference.    Significant Diagnostic Studies: X-ray Chest Pa And Lateral  03/06/2015  CLINICAL DATA:  Shortness of Breath EXAM: CHEST  2 VIEW COMPARISON:  03/05/2015 FINDINGS: Cardiac shadow is stable. A defibrillator is again noted. The lungs are well aerated bilaterally. No focal infiltrate or sizable effusion is seen. No acute bony abnormality is noted. IMPRESSION: No acute abnormality seen. Electronically Signed   By: Inez Catalina M.D.   On: 03/06/2015 09:02   Dg Chest 2 View  03/05/2015  CLINICAL DATA:  Cough congestion, weakness shortness of breath for 5 days. Worsening this morning. EXAM: CHEST  2 VIEW COMPARISON:  03/04/2015; 03/04/2014; 12/25/2014 FINDINGS: Grossly unchanged cardiac silhouette and mediastinal contours. Atherosclerotic plaque within the thoracic aorta. Stable position of support apparatus. The lungs remain hyperexpanded with flattening of the diaphragms. No focal airspace opacities. No pleural effusion pneumothorax. No evidence of edema. Old right-sided sixth rib fracture. No acute osseus abnormalities. IMPRESSION: Hyperexpanded lungs without acute cardiopulmonary disease. Electronically Signed   By: Sandi Mariscal M.D.   On: 03/05/2015 13:51   Dg Chest 2 View  03/04/2015  CLINICAL DATA:  75 year old male with cough and shortness of breath for 2 days. EXAM: CHEST  2 VIEW COMPARISON:  01/02/2015 and prior exams FINDINGS: Cardiomegaly and left-sided ICD/ pacemaker again noted. COPD/emphysema again noted.  There is no evidence of focal airspace disease, pulmonary edema, suspicious pulmonary nodule/mass, pleural effusion, or pneumothorax. No  acute bony abnormalities are identified. IMPRESSION: No evidence of acute cardiopulmonary disease. COPD/ emphysema and cardiomegaly. Electronically Signed   By: Margarette Canada M.D.   On: 03/04/2015 12:51   US Renal  03/06/2015  CLINICAL DATA:  Acute on chronic renal failure EXAM: RENAL / URINARY TRACT ULTRASOUND COMPLETE COMPARISON:  None of a FINDINGS: Right Kidney: Length: 9.8 cm. Mild increased cortical echogenicity and cortical thinning/ atrophy. No renal obstruction or hydronephrosis. No focal abnormality or mass. Left Kidney: Length: 10.5 cm. Mild cortical thinning but normal echogenicity. No acute process, hydronephrosis or focal abnormality. No mass evident. Bladder: Normal appearance for distension. Prostate is enlarged measuring 4.7 cm impressing upon the bladder base. IMPRESSION: Right renal increased echogenicity compatible with medical renal disease. No renal obstruction or hydronephrosis on either side. Enlarged prostate Electronically Signed   By: Jerilynn Mages.  Shick M.D.   On: 03/06/2015 21:31    Microbiology: Recent Results (from the past 240 hour(s))  Urine culture     Status: None   Collection Time: 03/05/15  1:45 PM  Result Value Ref Range Status   Specimen Description URINE, CLEAN CATCH  Final   Special Requests NONE  Final   Culture   Final    NO GROWTH 1 DAY Performed at [facility]    Report Status 03/06/2015 FINAL  Final  Respiratory virus panel     Status: Abnormal   Collection Time: 03/06/15 10:44 AM  Result Value Ref Range Status   Respiratory Syncytial Virus A Positive (A) Negative Final   Respiratory Syncytial Virus B Negative Negative Final   Influenza A Negative Negative Final   Influenza B Negative Negative Final   Parainfluenza 1 Negative Negative Final   Parainfluenza 2 Negative Negative Final   Parainfluenza 3 Negative  Negative Final   Metapneumovirus Negative Negative Final   Rhinovirus Negative Negative Final   Adenovirus Negative Negative Final    Comment: (NOTE) Performed At: [facility] [place], [place] [id number] Lindon Romp MD [id number]      Labs: Basic Metabolic Panel:  Recent Labs Lab 03/05/15 1300 03/05/15 2142 03/06/15 0534 03/07/15 0526 03/08/15 0459 03/09/15 0507  NA 138  --  142 143 140 138  K 4.2  --  3.8 3.9 3.8 3.8  CL 103  --  106 109 110 111  CO2 24  --  25 24 23 23   GLUCOSE 137*  --  146* 147* 173* 148*  BUN 42*  --  51* 53* 53* 52*  CREATININE 2.37*  --  2.31* 2.03* 1.92* 1.68*  CALCIUM 9.4  --  9.1 9.0 8.5* 8.2*  MG  --  1.6*  --  2.5*  --   --   PHOS  --   --   --  3.3  --   --    Liver Function Tests:  Recent Labs Lab 03/07/15 0526  ALBUMIN 3.0*   No results for input(s): LIPASE, AMYLASE in the last 168 hours. No results for input(s): AMMONIA in the last 168 hours. CBC:  Recent Labs Lab 03/05/15 1300 03/06/15 0534 03/07/15 0526 03/08/15 0459  WBC 14.8* 12.4* 19.7* 17.7*  NEUTROABS 12.5*  --   --   --   HGB 12.8* 12.3* 12.2* 11.7*  HCT 37.2* 36.7* 36.6* 34.9*  MCV 98.9 100.5* 101.4* 101.5*  PLT 130* 134* 144* 140*   Cardiac Enzymes: No results for input(s): CKTOTAL, CKMB, CKMBINDEX, TROPONINI in the last 168 hours. BNP: BNP (last 3 results)  Recent Labs  12/25/14 0807 01/04/15 0825 03/05/15 1605  BNP 1078.9* 188.0* 220.7*    ProBNP (last 3 results)  Recent Labs  04/25/14 1205 05/02/14 1115  PROBNP 2433.0* 1499.0*    CBG:  Recent Labs Lab 03/08/15 1713 03/08/15 2155 03/09/15 0744 03/09/15 1156 03/09/15 1644  GLUCAP 164* 142* 129* 169* 132*

## 2015-03-09 NOTE — Progress Notes (Signed)
Dr Charlies Silvers notified me that order for pending discharge home is for tomorrow

## 2015-03-09 NOTE — Discharge Instructions (Signed)

## 2015-03-09 NOTE — Progress Notes (Signed)
ANTICOAGULATION CONSULT NOTE - Follow Up Consult  Pharmacy Consult for coumadin Indication: atrial fibrillation  Allergies  Allergen Reactions  . Benadryl [Diphenhydramine Hcl] Nausea And Vomiting    Patient Measurements: Height: 5\' 9"  (175.3 cm) Weight: 170 lb 6.7 oz (77.3 kg) IBW/kg (Calculated) : 70.7   Vital Signs: Temp: 97.5 F (36.4 C) (02/16 0631) Temp Source: Oral (02/16 0631) BP: 122/57 mmHg (02/16 0934) Pulse Rate: 74 (02/16 0631)  Labs:  Recent Labs  03/07/15 0526 03/08/15 0459 03/09/15 0447 03/09/15 0507  HGB 12.2* 11.7*  --   --   HCT 36.6* 34.9*  --   --   PLT 144* 140*  --   --   LABPROT 35.7* 39.3* 37.1*  --   INR 3.68* 4.19* 3.88*  --   CREATININE 2.03* 1.92*  --  1.68*    Estimated Creatinine Clearance: 38 mL/min (by C-G formula based on Cr of 1.68).   Medications:  Coumadin 2 mg daily except 3 mg on Wednesdays and Saturdays  Assessment: Patient's a 75 y.o M on coumadin PTA for Afib, presented to the ED on 1/12 with c/o cough and SOB.  To resume coumadin for hx Afib.  Today, 03/09/2015: - INR finally peaked yesterday > 4, now trending down but still supratherapeutic - No CBC today; Hgb/Plt lower yesterday but non-alarming - no bleeding documented - drug-drug intxns: levaquin x 2 doses, changed to Augmentin - heart healthy diet, eating 25% of meals  Goal of Therapy:  INR 2-3 Monitor platelets by anticoagulation protocol: Yes   Plan:   Continue to hold coumadin today.  Doubt that this increase in INR is solely d/t Levaquin interaction, suspect high dose steroids, decr PO intake, acute illness.  INR daily, CBC at least q72 hr while on coumadiin  monitor for s/s bleeding  Reuel Boom, PharmD, BCPS Pager: [phone] 03/09/2015, 1:14 PM

## 2015-03-09 NOTE — Progress Notes (Signed)
Patient ID: Troy [name], male   DOB: [birth year], 75 y.o.   MRN: [id number] [facility] PROGRESS NOTE  DURAND BUSSELL [id number] DOB: 1940-09-16 DOA: 03/05/2015 PCP: Barbette Merino, MD  Brief narrative:    75 y.o. male with past medical history of CAD status post PCI to RCA, chronic atrial fibrillation on chronic anticoagulation with coumadin, hypertension, gastroesophageal reflux disease, type 2 diabetes, history of CHF, gout who presented to [facility] ED with a 5 day history of upper respiratory symptoms of productive cough of whitish sputum, congestion, rhinorrhea, worsening shortness of breath, chest tightness, wheezing. Patient was seen at [facility] emergency room 1 day prior to admission with similar symptoms, was prescribed doxycycline and prednisone and discharged.   In the ED, pt was hemodynamically stable. No acute findings on CXR. INR was 2.41. WBC count was 14.8, Cr 2.37. He was given IV solumedrol, continuous nebs and due to ongoing shortness of breath he was admitted for further evaluation.   Assessment/Plan:    Principal Problem: Acute respiratory failure with hypoxia / acute COPD exacerbation - Influenza PCR is negative.  - RSV (+) - Chest x-ray 03/06/2015 negative for acute infiltrate.  - Stop Augmentin and tamiflu today - Change solumedrol to prednisone today   Active Problems: Acute on chronic kidney disease stage III - Likely in the setting of diuretics and ARB.  - ARB and diuretics on hold.  - Check BMP today   Paroxysmal atrial fibrillation - CHADS vasc score 4 - Rate controlled with coreg and digoxin  - INR 3.8 today, coumadin dosing per pharmacy   Diabetes mellitus type 2 without complications without long term insulin use - Hemoglobin A1c is 6.7.  - Continue sliding scale insulin.  - CBG's in past 24 hours: 147, 173  Coronary artery disease/chronic systolic heart failure/status post ICD - Compensated   DVT Prophylaxis  - SCD's bilaterally  -  Coumadin    Code Status: DNR/DNI Family Communication:  plan of care discussed with the patient Disposition Plan: Home 03/10/2015   IV access:  Peripheral IV  Procedures and diagnostic studies:    X-ray Chest Pa And Lateral 03/06/2015  No acute abnormality seen. Electronically Signed   By: Inez Catalina M.D.   On: 03/06/2015 09:02   Dg Chest 2 View 03/05/2015   Hyperexpanded lungs without acute cardiopulmonary disease. Electronically Signed   By: Sandi Mariscal M.D.   On: 03/05/2015 13:51   US Renal 03/06/2015 Right renal increased echogenicity compatible with medical renal disease. No renal obstruction or hydronephrosis on either side. Enlarged prostate   Medical Consultants:  None   Other Consultants:  PT  IAnti-Infectives:   Levaquin 03/05/2015 --> 03/07/2015 Augmentin 03/07/2015 --> 03/09/15 Oseltamivir  Stopped 03/09/2015   Leisa Lenz, MD  [facility] Pager [phone]  Time spent in minutes: 25 minutes  If 7PM-7AM, please contact night-coverage [email or web address] Password [facility] 03/09/2015, 11:17 AM   LOS: 4 days    HPI/Subjective: No acute overnight events. Patient reports coughing.  Objective: Filed Vitals:   03/08/15 2154 03/09/15 0631 03/09/15 0934 03/09/15 1037  BP:  112/58 122/57   Pulse: 66 74    Temp: 97.6 F (36.4 C) 97.5 F (36.4 C)    TempSrc: Oral Oral    Resp: 18 18    Height:      Weight:  77.3 kg (170 lb 6.7 oz)    SpO2: 97% 93%  95%    Intake/Output Summary (Last 24 hours) at 03/09/15 1117 Last data filed  at 03/09/15 [id number]  Gross per 24 hour  Intake   1700 ml  Output    700 ml  Net   1000 ml    Exam:   General:  Pt is alert, not in acute distress  Cardiovascular: RRR, S1/S2 appreciated   Respiratory: coarse breath sounds, no wheezing   Abdomen: (+) BS, non tender   Extremities: No swelling, palpable pulses   Neuro: Nonfocal  Data Reviewed: Basic Metabolic Panel:  Recent Labs Lab 03/05/15 1300 03/05/15 2142 03/06/15 0534  03/07/15 0526 03/08/15 0459  NA 138  --  142 143 140  K 4.2  --  3.8 3.9 3.8  CL 103  --  106 109 110  CO2 24  --  25 24 23   GLUCOSE 137*  --  146* 147* 173*  BUN 42*  --  51* 53* 53*  CREATININE 2.37*  --  2.31* 2.03* 1.92*  CALCIUM 9.4  --  9.1 9.0 8.5*  MG  --  1.6*  --  2.5*  --   PHOS  --   --   --  3.3  --    Liver Function Tests:  Recent Labs Lab 03/07/15 0526  ALBUMIN 3.0*   No results for input(s): LIPASE, AMYLASE in the last 168 hours. No results for input(s): AMMONIA in the last 168 hours. CBC:  Recent Labs Lab 03/05/15 1300 03/06/15 0534 03/07/15 0526 03/08/15 0459  WBC 14.8* 12.4* 19.7* 17.7*  NEUTROABS 12.5*  --   --   --   HGB 12.8* 12.3* 12.2* 11.7*  HCT 37.2* 36.7* 36.6* 34.9*  MCV 98.9 100.5* 101.4* 101.5*  PLT 130* 134* 144* 140*   Cardiac Enzymes: No results for input(s): CKTOTAL, CKMB, CKMBINDEX, TROPONINI in the last 168 hours. BNP: Invalid input(s): POCBNP CBG:  Recent Labs Lab 03/08/15 0753 03/08/15 1214 03/08/15 1713 03/08/15 2155 03/09/15 0744  GLUCAP 168* 187* 164* 142* 129*    Urine culture     Status: None   Collection Time: 03/05/15  1:45 PM  Result Value Ref Range Status   Specimen Description URINE, CLEAN CATCH  Final   Special Requests NONE  Final   Culture   Final    NO GROWTH 1 DAY Performed at [facility]    Report Status 03/06/2015 FINAL  Final  Respiratory virus panel     Status: Abnormal   Collection Time: 03/06/15 10:44 AM  Result Value Ref Range Status   Respiratory Syncytial Virus A Positive (A) Negative Final   Respiratory Syncytial Virus B Negative Negative Final   Influenza A Negative Negative Final   Influenza B Negative Negative Final   Parainfluenza 1 Negative Negative Final   Parainfluenza 2 Negative Negative Final   Parainfluenza 3 Negative Negative Final   Metapneumovirus Negative Negative Final   Rhinovirus Negative Negative Final   Adenovirus Negative Negative Final      Scheduled Meds: . alfuzosin  10 mg Oral Q breakfast  . allopurinol  100 mg Oral Daily  . arformoterol  15 mcg Nebulization BID  . atorvastatin  10 mg Oral QHS  . budesonide (PULMICORT) nebulizer solution  0.25 mg Nebulization BID  . carvedilol  12.5 mg Oral BID WC  . digoxin  0.0313 mg Oral Daily  . guaiFENesin  1,200 mg Oral BID  . hydrALAZINE  25 mg Oral TID  . insulin aspart  0-15 Units Subcutaneous TID WC  . levalbuterol  0.63 mg Nebulization TID  . loratadine  10 mg Oral  Daily  . pantoprazole  40 mg Oral Daily  . [START ON 03/10/2015] predniSONE  50 mg Oral Q breakfast  . sodium chloride flush  3 mL Intravenous Q12H  . Warfarin - Pharmacist Dosing Inpatient   Does not apply q1800   Continuous Infusions: . sodium chloride 100 mL/hr at 03/08/15 2344

## 2015-03-10 LAB — GLUCOSE, CAPILLARY
GLUCOSE-CAPILLARY: 75 mg/dL (ref 65–99)
GLUCOSE-CAPILLARY: 174 mg/dL — AB (ref 65–99)
Glucose-Capillary: 77 mg/dL (ref 65–99)

## 2015-03-10 LAB — PROTIME-INR
INR: 3.11 — ABNORMAL HIGH (ref 0.00–1.49)
Prothrombin Time: 31.4 seconds — ABNORMAL HIGH (ref 11.6–15.2)

## 2015-03-10 MED ORDER — WARFARIN SODIUM 1 MG PO TABS
1.0000 mg | ORAL_TABLET | Freq: Once | ORAL | Status: AC
  Administered 2015-03-10: 1 mg via ORAL
  Filled 2015-03-10: qty 1

## 2015-03-10 NOTE — Progress Notes (Signed)
[facility] is providing the following services: Patient is currently on oxygen at 2lpm cont with [facility]  If patient discharges after hours, please call [phone].   Linward Headland 03/10/2015, 12:29 PM

## 2015-03-10 NOTE — Progress Notes (Addendum)
ANTICOAGULATION CONSULT NOTE - Follow Up Consult  Pharmacy Consult for Warfarin Indication: atrial fibrillation  Allergies  Allergen Reactions  . Benadryl [Diphenhydramine Hcl] Nausea And Vomiting   Patient Measurements: Height: 5\' 9"  (175.3 cm) Weight: 181 lb (82.101 kg) IBW/kg (Calculated) : 70.7  Vital Signs: Temp: 97.3 F (36.3 C) (02/17 1000) Temp Source: Oral (02/17 1000) BP: 130/52 mmHg (02/17 1000) Pulse Rate: 69 (02/17 1000)  Labs:  Recent Labs  03/08/15 0459 03/09/15 0447 03/09/15 0507 03/10/15 0502  HGB 11.7*  --   --   --   HCT 34.9*  --   --   --   PLT 140*  --   --   --   LABPROT 39.3* 37.1*  --  31.4*  INR 4.19* 3.88*  --  3.11*  CREATININE 1.92*  --  1.68*  --     Estimated Creatinine Clearance: 38 mL/min (by C-G formula based on Cr of 1.68).  Assessment: Patient's a 75 y.o M on warfarin PTA for Afib, presented to the ED on 1/12 with c/o cough and SOB.  Pharmacy is consulted to resume warfarin for hx Afib. Home warfarin dose reported as: 2 mg daily except 3 mg on Wednesdays and Saturdays.  INR 2.41 was therapeutic on admission.  Today, 03/10/2015: - INR 3.11, trending down but still supratherapeutic (warfarin held x3 days) - No CBC today; Hgb/Plt low/decreased (2/15), but no bleeding documented - Diet:  heart healthy diet, eating 25% of meals - Drug-drug intxns: levaquin x 2 doses, then Augmentin x2 days, now d/c.  Goal of Therapy:  INR 2-3 Monitor platelets by anticoagulation protocol: Yes   Plan:   Warfarin 1mg  PO today at 1800  Suspect elevated INR multifactorial d/t drug interactions, decreased PO intake, acute illness.  INR daily, CBC at least q72 hr while on warfarin  Monitor for s/s bleeding  For discharge, would recommend resuming previous home warfarin dose with INR re-check in ~3 days.  Gretta Arab PharmD, BCPS Pager [phone] 03/10/2015 12:25 PM

## 2015-03-10 NOTE — Progress Notes (Signed)
Pt seen and examined at the bedside. Still coughing a lot so will hold off on discharge until tomorrow   Leisa Lenz [facility] [phone]

## 2015-03-10 NOTE — Progress Notes (Signed)
Physical Therapy Treatment Patient Details Name: Troy [name] MRN: [id number] DOB: 1940/04/19 Today's Date: 2015/03/23    History of Present Illness Patient 75 year old male with history of coronary artery disease,  ischemic cardiomyopathy, hypertension, DVT, diabetes mellitus, atrial fibrillation, chronic systolic heart failure, COPD, ejection fraction was 20-25% with diffuse hypokinesis.  He has a Best boy.  Recent admission 12/25/14 with SOB. Pt now admitted 03/05/15 with COPD exacerbation, flu negative, RSV panel pending.     PT Comments    Pt reports not feeling well again today however very agreeable to mobilize.  Pt ambulated in hallway and remained on 2L O2 [place].  Follow Up Recommendations  No PT follow up     Equipment Recommendations  None recommended by PT    Recommendations for Other Services       Precautions / Restrictions Precautions Precautions: Fall Precaution Comments: monitor O2    Mobility  Bed Mobility               General bed mobility comments: pt up in recliner  Transfers Overall transfer level: Needs assistance Equipment used: Rolling walker (2 wheeled) Transfers: Sit to/from Stand Sit to Stand: Supervision         General transfer comment: pt reports still not feeling well today  Ambulation/Gait Ambulation/Gait assistance: Supervision Ambulation Distance (Feet): 200 Feet Assistive device: Rolling walker (2 wheeled) Gait Pattern/deviations: Step-through pattern;Decreased stride length     General Gait Details: verbal cues for RW positioning, SpO2 90% on 2L O2 [place] during gait, a couple short standing rest breaks for breathing required   Stairs            Wheelchair Mobility    Modified Rankin (Stroke Patients Only)       Balance                                    Cognition Arousal/Alertness: Awake/alert Behavior During Therapy: WFL for tasks assessed/performed Overall Cognitive Status:  Within Functional Limits for tasks assessed                      Exercises      General Comments        Pertinent Vitals/Pain Pain Assessment: No/denies pain    Home Living                      Prior Function            PT Goals (current goals can now be found in the care plan section) Progress towards PT goals: Progressing toward goals    Frequency  Min 3X/week    PT Plan Current plan remains appropriate    Co-evaluation             End of Session Equipment Utilized During Treatment: Oxygen Activity Tolerance: Patient tolerated treatment well Patient left: in chair;with call bell/phone within reach     Time: [id number] PT Time Calculation (min) (ACUTE ONLY): 17 min  Charges:  $Gait Training: 8-22 mins                    G Codes:      Roald Lukacs,KATHrine E 2015/03/23, 1:16 PM Carmelia Bake, PT, DPT 2015-03-23 Pager: [phone]

## 2015-03-11 LAB — CBC
HEMATOCRIT: 38.9 % — AB (ref 39.0–52.0)
HEMOGLOBIN: 13.1 g/dL (ref 13.0–17.0)
MCH: 34.2 pg — AB (ref 26.0–34.0)
MCHC: 33.7 g/dL (ref 30.0–36.0)
MCV: 101.6 fL — ABNORMAL HIGH (ref 78.0–100.0)
Platelets: 122 10*3/uL — ABNORMAL LOW (ref 150–400)
RBC: 3.83 MIL/uL — AB (ref 4.22–5.81)
RDW: 15.2 % (ref 11.5–15.5)
WBC: 14.1 10*3/uL — ABNORMAL HIGH (ref 4.0–10.5)

## 2015-03-11 LAB — GLUCOSE, CAPILLARY
GLUCOSE-CAPILLARY: 111 mg/dL — AB (ref 65–99)
GLUCOSE-CAPILLARY: 114 mg/dL — AB (ref 65–99)
GLUCOSE-CAPILLARY: 205 mg/dL — AB (ref 65–99)
GLUCOSE-CAPILLARY: 100 mg/dL — AB (ref 65–99)
Glucose-Capillary: 152 mg/dL — ABNORMAL HIGH (ref 65–99)

## 2015-03-11 LAB — EXPECTORATED SPUTUM ASSESSMENT W REFEX TO RESP CULTURE

## 2015-03-11 LAB — EXPECTORATED SPUTUM ASSESSMENT W GRAM STAIN, RFLX TO RESP C

## 2015-03-11 LAB — PROTIME-INR
INR: 2.46 — AB (ref 0.00–1.49)
PROTHROMBIN TIME: 26.4 s — AB (ref 11.6–15.2)

## 2015-03-11 MED ORDER — BENZONATATE 100 MG PO CAPS
200.0000 mg | ORAL_CAPSULE | Freq: Three times a day (TID) | ORAL | Status: DC
  Administered 2015-03-11 – 2015-03-13 (×7): 200 mg via ORAL
  Filled 2015-03-11 (×7): qty 2

## 2015-03-11 MED ORDER — WARFARIN SODIUM 2 MG PO TABS
2.0000 mg | ORAL_TABLET | Freq: Once | ORAL | Status: AC
  Administered 2015-03-11: 2 mg via ORAL
  Filled 2015-03-11: qty 1

## 2015-03-11 MED ORDER — HYDROCOD POLST-CPM POLST ER 10-8 MG/5ML PO SUER
5.0000 mL | Freq: Two times a day (BID) | ORAL | Status: DC
  Administered 2015-03-11 – 2015-03-13 (×5): 5 mL via ORAL
  Filled 2015-03-11 (×5): qty 5

## 2015-03-11 NOTE — Progress Notes (Signed)
ANTICOAGULATION CONSULT NOTE - Follow Up Consult  Pharmacy Consult for Warfarin Indication: atrial fibrillation  Allergies  Allergen Reactions  . Benadryl [Diphenhydramine Hcl] Nausea And Vomiting   Patient Measurements: Height: 5\' 9"  (175.3 cm) Weight: 186 lb 8.2 oz (84.6 kg) IBW/kg (Calculated) : 70.7  Vital Signs: Temp: 97.7 F (36.5 C) (02/18 0511) Temp Source: Oral (02/18 0511) BP: 108/67 mmHg (02/18 0511) Pulse Rate: 85 (02/18 0511)  Labs:  Recent Labs  03/09/15 0447 03/09/15 0507 03/10/15 0502 03/11/15 0532  HGB  --   --   --  13.1  HCT  --   --   --  38.9*  PLT  --   --   --  122*  LABPROT 37.1*  --  31.4* 26.4*  INR 3.88*  --  3.11* 2.46*  CREATININE  --  1.68*  --   --     Estimated Creatinine Clearance: 38 mL/min (by C-G formula based on Cr of 1.68).  Assessment: Patient's a 75 y.o M on warfarin PTA for Afib, presented to the ED on 1/12 with c/o cough and SOB.  Pharmacy is consulted to resume warfarin for hx Afib. Home warfarin dose reported as: 2 mg daily except 3 mg on Wednesdays and Saturdays.  INR 2.41 was therapeutic on admission.  Today, 03/11/2015: - INR 2.46, trending down to therapeutic (warfarin held x3 days) - CBC:  Hgb improved and Plt decreased. - No bleeding documented - Diet:  heart healthy diet, eating 75-80% of meals - Drug-drug intxns: levaquin x 2 doses, then Augmentin x2 days, now d/c.  Goal of Therapy:  INR 2-3 Monitor platelets by anticoagulation protocol: Yes   Plan:   Warfarin 2 mg PO today at 1800  Suspect elevated INR multifactorial d/t drug interactions, decreased PO intake, acute illness.  INR daily, CBC at least q72 hr while on warfarin  Monitor for s/s bleeding  For discharge, would recommend resuming previous home warfarin dose with INR re-check in ~3 days.  Gretta Arab PharmD, BCPS Pager [phone] 03/11/2015 9:43 AM

## 2015-03-11 NOTE — Progress Notes (Signed)
Patient ID: Troy [name], male   DOB: December 14, 1940, 75 y.o.   MRN: [id number] [facility] PROGRESS NOTE  Troy [name] [id number] DOB: 09-01-1940 DOA: 03/05/2015 PCP: Barbette Merino, MD  Brief narrative:    75 y.o. male with past medical history of CAD status post PCI to RCA, chronic atrial fibrillation on chronic anticoagulation with coumadin, hypertension, gastroesophageal reflux disease, type 2 diabetes, history of CHF, gout who presented to [facility] ED with a 5 day history of upper respiratory symptoms of productive cough of whitish sputum, congestion, rhinorrhea, worsening shortness of breath, chest tightness, wheezing. Patient was seen at [facility] emergency room 1 day prior to admission with similar symptoms, was prescribed doxycycline and prednisone and discharged.   In the ED, pt was hemodynamically stable. No acute findings on CXR. INR was 2.41. WBC count was 14.8, Cr 2.37. He was given IV solumedrol, continuous nebs and due to ongoing shortness of breath he was admitted for further evaluation.   Barrier to discharge: ongoing cough.   Assessment/Plan:    Principal Problem: Acute respiratory failure with hypoxia / acute COPD exacerbation / cough - Influenza PCR is negative.  - RSV (+) - Chest x-ray 03/06/2015 negative for acute infiltrate.  - Stopped Augmentin and tamiflu 2/16 - Stopped prednisone 2/17 - Start tussionex and stop mucinex   Active Problems: Acute on chronic kidney disease stage III - Likely in the setting of diuretics and ARB.  - ARB and diuretics on hold.   Paroxysmal atrial fibrillation - CHADS vasc score 4 - Rate controlled with coreg and digoxin  - Coumadin dosing per pharmacy   Diabetes mellitus type 2 without complications without long term insulin use - Hemoglobin A1c is 6.7.   Coronary artery disease/chronic systolic heart failure/status post ICD - Compensated   DVT Prophylaxis  - SCD's bilaterally  - Coumadin was on hold due  to supratherapeutic INR    Code Status: DNR/DNI Family Communication: plan of care discussed with the patient Disposition Plan: Home 2/20   IV access:  Peripheral IV  Procedures and diagnostic studies:    X-ray Chest Pa And Lateral 03/06/2015 No acute abnormality seen. Electronically Signed By: Inez Catalina M.D. On: 03/06/2015 09:02   Dg Chest 2 View 03/05/2015 Hyperexpanded lungs without acute cardiopulmonary disease. Electronically Signed By: Sandi Mariscal M.D. On: 03/05/2015 13:51   US Renal 03/06/2015 Right renal increased echogenicity compatible with medical renal disease. No renal obstruction or hydronephrosis on either side. Enlarged prostate   IAnti-Infectives:   Levaquin 03/05/2015 --> 03/07/2015 Augmentin 03/07/2015 --> 03/09/15 Oseltamivir - Stopped 03/09/2015   Leisa Lenz, MD  [facility] Pager [phone]  Time spent in minutes: 25 minutes  If 7PM-7AM, please contact night-coverage [email or web address] Password [email or web address] 03/11/2015, 1:18 PM   LOS: 6 days    HPI/Subjective: No acute overnight events. Patient reports still coughing a lot.   Objective: Filed Vitals:   03/10/15 2218 03/11/15 0511 03/11/15 0900 03/11/15 1012  BP: 136/58 108/67 140/50   Pulse: 73 85 67   Temp: 97.8 F (36.6 C) 97.7 F (36.5 C)    TempSrc: Oral Oral    Resp: 18 18    Height:      Weight:  84.6 kg (186 lb 8.2 oz)    SpO2: 98% 98% 100% 98%    Intake/Output Summary (Last 24 hours) at 03/11/15 1318 Last data filed at 03/11/15 0944  Gross per 24 hour  Intake   3505 ml  Output    675 ml  Net  2830 ml    Exam:   General:  Pt is alert, not in acute distress  Cardiovascular: Regular rate and rhythm, S1/S2 (+)  Respiratory: Congested, no wheezing   Abdomen: Soft, non tender, non distended, bowel sounds present  Extremities: No edema, pulses DP and PT palpable bilaterally  Neuro: Grossly nonfocal  Data Reviewed: Basic Metabolic Panel:  Recent Labs Lab  03/05/15 1300 03/05/15 2142 03/06/15 0534 03/07/15 0526 03/08/15 0459 03/09/15 0507  NA 138  --  142 143 140 138  K 4.2  --  3.8 3.9 3.8 3.8  CL 103  --  106 109 110 111  CO2 24  --  25 24 23 23   GLUCOSE 137*  --  146* 147* 173* 148*  BUN 42*  --  51* 53* 53* 52*  CREATININE 2.37*  --  2.31* 2.03* 1.92* 1.68*  CALCIUM 9.4  --  9.1 9.0 8.5* 8.2*  MG  --  1.6*  --  2.5*  --   --   PHOS  --   --   --  3.3  --   --    Liver Function Tests:  Recent Labs Lab 03/07/15 0526  ALBUMIN 3.0*   No results for input(s): LIPASE, AMYLASE in the last 168 hours. No results for input(s): AMMONIA in the last 168 hours. CBC:  Recent Labs Lab 03/05/15 1300 03/06/15 0534 03/07/15 0526 03/08/15 0459 03/11/15 0532  WBC 14.8* 12.4* 19.7* 17.7* 14.1*  NEUTROABS 12.5*  --   --   --   --   HGB 12.8* 12.3* 12.2* 11.7* 13.1  HCT 37.2* 36.7* 36.6* 34.9* 38.9*  MCV 98.9 100.5* 101.4* 101.5* 101.6*  PLT 130* 134* 144* 140* 122*   Cardiac Enzymes: No results for input(s): CKTOTAL, CKMB, CKMBINDEX, TROPONINI in the last 168 hours. BNP: Invalid input(s): POCBNP CBG:  Recent Labs Lab 03/09/15 2142 03/10/15 0746 03/10/15 1200 03/10/15 1602 03/11/15 0739  GLUCAP 105* 77 75 174* 111*    Recent Results (from the past 240 hour(s))  Urine culture     Status: None   Collection Time: 03/05/15  1:45 PM  Result Value Ref Range Status   Specimen Description URINE, CLEAN CATCH  Final   Special Requests NONE  Final   Culture   Final    NO GROWTH 1 DAY Performed at [facility]    Report Status 03/06/2015 FINAL  Final  Respiratory virus panel     Status: Abnormal   Collection Time: 03/06/15 10:44 AM  Result Value Ref Range Status   Respiratory Syncytial Virus A Positive (A) Negative Final   Respiratory Syncytial Virus B Negative Negative Final   Influenza A Negative Negative Final   Influenza B Negative Negative Final   Parainfluenza 1 Negative Negative Final   Parainfluenza 2  Negative Negative Final   Parainfluenza 3 Negative Negative Final   Metapneumovirus Negative Negative Final   Rhinovirus Negative Negative Final   Adenovirus Negative Negative Final    Comment: (NOTE) Performed At: [facility] [place], [place] [id number] Lindon Romp MD [id number]      Scheduled Meds: . alfuzosin  10 mg Oral Q breakfast  . allopurinol  100 mg Oral Daily  . arformoterol  15 mcg Nebulization BID  . atorvastatin  10 mg Oral QHS  . benzonatate  200 mg Oral TID  . budesonide (PULMICORT) nebulizer solution  0.25 mg Nebulization BID  . carvedilol  12.5 mg Oral BID WC  . chlorpheniramine-HYDROcodone  5 mL Oral Q12H  . digoxin  0.0313 mg Oral Daily  . hydrALAZINE  25 mg Oral TID  . insulin aspart  0-15 Units Subcutaneous TID WC  . levalbuterol  0.63 mg Nebulization TID  . loratadine  10 mg Oral Daily  . pantoprazole  40 mg Oral Daily  . sodium chloride flush  3 mL Intravenous Q12H  . warfarin  2 mg Oral ONCE-1800  . Warfarin - Pharmacist Dosing Inpatient   Does not apply q1800   Continuous Infusions: . sodium chloride Stopped (03/11/15 0515)

## 2015-03-11 NOTE — Progress Notes (Signed)
Taking over care of pt, agree with previous RN assessment. Pt resting comfortably at this time. Will continue to monitor.

## 2015-03-12 DIAGNOSIS — I2510 Atherosclerotic heart disease of native coronary artery without angina pectoris: Secondary | ICD-10-CM

## 2015-03-12 LAB — PROTIME-INR
INR: 2.42 — AB (ref 0.00–1.49)
PROTHROMBIN TIME: 26 s — AB (ref 11.6–15.2)

## 2015-03-12 LAB — GLUCOSE, CAPILLARY
GLUCOSE-CAPILLARY: 143 mg/dL — AB (ref 65–99)
GLUCOSE-CAPILLARY: 109 mg/dL — AB (ref 65–99)
Glucose-Capillary: 73 mg/dL (ref 65–99)
Glucose-Capillary: 120 mg/dL — ABNORMAL HIGH (ref 65–99)

## 2015-03-12 MED ORDER — WARFARIN SODIUM 2 MG PO TABS
2.0000 mg | ORAL_TABLET | Freq: Once | ORAL | Status: AC
  Administered 2015-03-12: 2 mg via ORAL
  Filled 2015-03-12: qty 1

## 2015-03-12 NOTE — Progress Notes (Signed)
ANTICOAGULATION CONSULT NOTE - Follow Up Consult  Pharmacy Consult for Warfarin Indication: atrial fibrillation  Allergies  Allergen Reactions  . Benadryl [Diphenhydramine Hcl] Nausea And Vomiting   Patient Measurements: Height: 5\' 9"  (175.3 cm) Weight: 183 lb 6.8 oz (83.2 kg) IBW/kg (Calculated) : 70.7  Vital Signs: Temp: 98.2 F (36.8 C) (02/19 0536) Temp Source: Oral (02/19 0536) BP: 133/88 mmHg (02/19 0536) Pulse Rate: 66 (02/19 0536)  Labs:  Recent Labs  03/10/15 0502 03/11/15 0532 03/12/15 0553  HGB  --  13.1  --   HCT  --  38.9*  --   PLT  --  122*  --   LABPROT 31.4* 26.4* 26.0*  INR 3.11* 2.46* 2.42*    Estimated Creatinine Clearance: 38 mL/min (by C-G formula based on Cr of 1.68).  Assessment: Patient's a 75 y.o M on warfarin PTA for Afib, presented to the ED on 1/12 with c/o cough and SOB.  Pharmacy is consulted to resume warfarin for hx Afib. Home warfarin dose reported as: 2 mg daily except 3 mg on Wednesdays and Saturdays.  INR 2.41 was therapeutic on admission.  Today, 03/12/2015: - INR 2.42, trending down and remains therapeutic - CBC:  Hgb improved and Plt decreased. (2/18) - No bleeding documented - Diet:  heart healthy diet, eating 75-80% of meals - Drug-drug intxns: levaquin x 2 doses, then Augmentin x2 days, now d/c.  Goal of Therapy:  INR 2-3 Monitor platelets by anticoagulation protocol: Yes   Plan:   Warfarin 2 mg PO today at 1800  Suspect elevated INR multifactorial d/t drug interactions, decreased PO intake, acute illness.  INR daily, CBC at least q72 hr while on warfarin  Monitor for s/s bleeding  For discharge, would recommend resuming previous home warfarin dose with INR re-check in ~3 days.  Gretta Arab PharmD, BCPS Pager [phone] 03/12/2015 11:15 AM

## 2015-03-12 NOTE — Progress Notes (Signed)
Patient ID: Troy [name], male   DOB: 1940/12/14, 75 y.o.   MRN: [id number] [facility] PROGRESS NOTE  Troy [name] [id number] DOB: 06-May-1940 DOA: 03/05/2015 PCP: Barbette Merino, MD  Brief narrative:    75 y.o. male with past medical history of CAD status post PCI to RCA, chronic atrial fibrillation on chronic anticoagulation with coumadin, hypertension, gastroesophageal reflux disease, type 2 diabetes, history of CHF, gout who presented to [facility] ED with a 5 day history of upper respiratory symptoms of productive cough of whitish sputum, congestion, rhinorrhea, worsening shortness of breath, chest tightness, wheezing. Patient was seen at [facility] emergency room 1 day prior to admission with similar symptoms, was prescribed doxycycline and prednisone and discharged.   In the ED, pt was hemodynamically stable. No acute findings on CXR. INR was 2.41. WBC count was 14.8, Cr 2.37. He was given IV solumedrol, continuous nebs and due to ongoing shortness of breath he was admitted for further evaluation.   Assessment/Plan:    Principal Problem: Acute respiratory failure with hypoxia / acute COPD exacerbation / cough - Influenza PCR is negative.  - RSV (+) - Chest x-ray 03/06/2015 negative for acute infiltrate.  - Stopped Augmentin and tamiflu 2/16 - Stopped prednisone 2/17 - Continue antitussives as needed for cough   Active Problems: Acute on chronic kidney disease stage III - Likely in the setting of diuretics and ARB.  - ARB and diuretics on hold.  - Check BMP in am  Paroxysmal atrial fibrillation - CHADS vasc score 4 - Rate controlled with coreg and digoxin  - Coumadin dosing per pharmacy   Diabetes mellitus type 2 without complications without long term insulin use - Hemoglobin A1c is 6.7.   Coronary artery disease/chronic systolic heart failure/status post ICD - Compensated   DVT Prophylaxis  - SCD's bilaterally  - Coumadin per pharmacy  dosing.   Code Status: DNR/DNI Family Communication: plan of care discussed with the patient Disposition Plan: Home 2/20   IV access:  Peripheral IV  Procedures and diagnostic studies:    X-ray Chest Pa And Lateral 03/06/2015 No acute abnormality seen. Electronically Signed By: Inez Catalina M.D. On: 03/06/2015 09:02   Dg Chest 2 View 03/05/2015 Hyperexpanded lungs without acute cardiopulmonary disease. Electronically Signed By: Sandi Mariscal M.D. On: 03/05/2015 13:51   US Renal 03/06/2015 Right renal increased echogenicity compatible with medical renal disease. No renal obstruction or hydronephrosis on either side. Enlarged prostate   IAnti-Infectives:   Levaquin 03/05/2015 --> 03/07/2015 Augmentin 03/07/2015 --> 03/09/15 Oseltamivir - Stopped 03/09/2015   Leisa Lenz, MD  [facility] Pager [phone]  Time spent in minutes: 25 minutes  If 7PM-7AM, please contact night-coverage [email or web address] Password [facility] 03/12/2015, 3:14 PM   LOS: 7 days    HPI/Subjective: No acute overnight events. Patient reports cough in am.  Objective: Filed Vitals:   03/11/15 2106 03/12/15 0509 03/12/15 0536 03/12/15 1453  BP:   133/88 106/52  Pulse:   66 59  Temp:   98.2 F (36.8 C) 97.7 F (36.5 C)  TempSrc:   Oral Oral  Resp:   19 16  Height:      Weight:  83.2 kg (183 lb 6.8 oz)    SpO2: 95%  97% 89%    Intake/Output Summary (Last 24 hours) at 03/12/15 1514 Last data filed at 03/12/15 1300  Gross per 24 hour  Intake    540 ml  Output   2100 ml  Net  -1560 ml    Exam:  General:  Pt is not in acute distress  Cardiovascular: RRR, S1/S2 appreciated   Respiratory: Coarse sounds, no wheezing    Abdomen: (+) BS, non tender   Extremities: No swelling, palpable pulses   Neuro: Nonfocal  Data Reviewed: Basic Metabolic Panel:  Recent Labs Lab 03/05/15 2142 03/06/15 0534 03/07/15 0526 03/08/15 0459 03/09/15 0507  NA  --  142 143 140 138  K  --  3.8  3.9 3.8 3.8  CL  --  106 109 110 111  CO2  --  25 24 23 23   GLUCOSE  --  146* 147* 173* 148*  BUN  --  51* 53* 53* 52*  CREATININE  --  2.31* 2.03* 1.92* 1.68*  CALCIUM  --  9.1 9.0 8.5* 8.2*  MG 1.6*  --  2.5*  --   --   PHOS  --   --  3.3  --   --    Liver Function Tests:  Recent Labs Lab 03/07/15 0526  ALBUMIN 3.0*   No results for input(s): LIPASE, AMYLASE in the last 168 hours. No results for input(s): AMMONIA in the last 168 hours. CBC:  Recent Labs Lab 03/06/15 0534 03/07/15 0526 03/08/15 0459 03/11/15 0532  WBC 12.4* 19.7* 17.7* 14.1*  HGB 12.3* 12.2* 11.7* 13.1  HCT 36.7* 36.6* 34.9* 38.9*  MCV 100.5* 101.4* 101.5* 101.6*  PLT 134* 144* 140* 122*   Cardiac Enzymes: No results for input(s): CKTOTAL, CKMB, CKMBINDEX, TROPONINI in the last 168 hours. BNP: Invalid input(s): POCBNP CBG:  Recent Labs Lab 03/11/15 1222 03/11/15 1630 03/11/15 2036 03/12/15 0725 03/12/15 1141  GLUCAP 114* 205* 100* 73 143*    Recent Results (from the past 240 hour(s))  Urine culture     Status: None   Collection Time: 03/05/15  1:45 PM  Result Value Ref Range Status   Specimen Description URINE, CLEAN CATCH  Final   Special Requests NONE  Final   Culture   Final    NO GROWTH 1 DAY Performed at [facility]    Report Status 03/06/2015 FINAL  Final  Respiratory virus panel     Status: Abnormal   Collection Time: 03/06/15 10:44 AM  Result Value Ref Range Status   Respiratory Syncytial Virus A Positive (A) Negative Final   Respiratory Syncytial Virus B Negative Negative Final   Influenza A Negative Negative Final   Influenza B Negative Negative Final   Parainfluenza 1 Negative Negative Final   Parainfluenza 2 Negative Negative Final   Parainfluenza 3 Negative Negative Final   Metapneumovirus Negative Negative Final   Rhinovirus Negative Negative Final   Adenovirus Negative Negative Final    Comment: (NOTE) Performed At: [facility] [address] [place], [place] [id number] Lindon Romp MD [id number]   Culture, sputum-assessment     Status: None   Collection Time: 03/11/15  1:26 PM  Result Value Ref Range Status   Specimen Description SPUTUM  Final   Special Requests NONE  Final   Sputum evaluation THIS SPECIMEN IS ACCEPTABLE FOR SPUTUM CULTURE  Final   Report Status 03/11/2015 FINAL  Final     Scheduled Meds: . alfuzosin  10 mg Oral Q breakfast  . allopurinol  100 mg Oral Daily  . arformoterol  15 mcg Nebulization BID  . atorvastatin  10 mg Oral QHS  . benzonatate  200 mg Oral TID  . budesonide (PULMICORT) nebulizer solution  0.25 mg Nebulization BID  . carvedilol  12.5 mg Oral BID  WC  . chlorpheniramine-HYDROcodone  5 mL Oral Q12H  . digoxin  0.0313 mg Oral Daily  . hydrALAZINE  25 mg Oral TID  . insulin aspart  0-15 Units Subcutaneous TID WC  . loratadine  10 mg Oral Daily  . pantoprazole  40 mg Oral Daily  . sodium chloride flush  3 mL Intravenous Q12H  . warfarin  2 mg Oral ONCE-1800  . Warfarin - Pharmacist Dosing Inpatient   Does not apply q1800   Continuous Infusions:

## 2015-03-13 LAB — BASIC METABOLIC PANEL
Anion gap: 3 — ABNORMAL LOW (ref 5–15)
BUN: 30 mg/dL — AB (ref 6–20)
CHLORIDE: 111 mmol/L (ref 101–111)
CO2: 27 mmol/L (ref 22–32)
CREATININE: 1.43 mg/dL — AB (ref 0.61–1.24)
Calcium: 8.6 mg/dL — ABNORMAL LOW (ref 8.9–10.3)
GFR calc non Af Amer: 46 mL/min — ABNORMAL LOW (ref >60)
GFR, EST AFRICAN AMERICAN: 54 mL/min — AB (ref >60)
GLUCOSE: 150 mg/dL — AB (ref 65–99)
Potassium: 4.5 mmol/L (ref 3.5–5.1)
Sodium: 141 mmol/L (ref 135–145)

## 2015-03-13 LAB — GLUCOSE, CAPILLARY
GLUCOSE-CAPILLARY: 112 mg/dL — AB (ref 65–99)
GLUCOSE-CAPILLARY: 148 mg/dL — AB (ref 65–99)
Glucose-Capillary: 134 mg/dL — ABNORMAL HIGH (ref 65–99)
Glucose-Capillary: 231 mg/dL — ABNORMAL HIGH (ref 65–99)

## 2015-03-13 LAB — PROTIME-INR
INR: 2.74 — ABNORMAL HIGH (ref 0.00–1.49)
PROTHROMBIN TIME: 28.6 s — AB (ref 11.6–15.2)

## 2015-03-13 MED ORDER — IPRATROPIUM-ALBUTEROL 0.5-2.5 (3) MG/3ML IN SOLN
3.0000 mL | Freq: Three times a day (TID) | RESPIRATORY_TRACT | Status: DC
  Administered 2015-03-14 – 2015-03-15 (×4): 3 mL via RESPIRATORY_TRACT
  Filled 2015-03-13 (×4): qty 3

## 2015-03-13 MED ORDER — IPRATROPIUM-ALBUTEROL 0.5-2.5 (3) MG/3ML IN SOLN: 3.0000 mL | RESPIRATORY_TRACT | Status: DC | PRN

## 2015-03-13 MED ORDER — IPRATROPIUM-ALBUTEROL 0.5-2.5 (3) MG/3ML IN SOLN
3.0000 mL | Freq: Four times a day (QID) | RESPIRATORY_TRACT | Status: DC
  Administered 2015-03-13: 3 mL via RESPIRATORY_TRACT
  Filled 2015-03-13: qty 3

## 2015-03-13 MED ORDER — IPRATROPIUM-ALBUTEROL 0.5-2.5 (3) MG/3ML IN SOLN
3.0000 mL | RESPIRATORY_TRACT | Status: DC
  Administered 2015-03-13: 3 mL via RESPIRATORY_TRACT

## 2015-03-13 NOTE — Progress Notes (Signed)
Spoke wit pt concerning HH or SNF.  Pt states that he has to think about.

## 2015-03-13 NOTE — Progress Notes (Signed)
PT Cancellation Note  Patient Details Name: Troy [name] MRN: [id number] DOB: 1940/04/15   Cancelled Treatment:     spoke with nurse and patient at length. Educated how he needed to get up with me today and see how he is feeling. Pt agrees but states he feels so bad "I just have never had something like this before" while he was coughing. He agreed to work with me after lunch today.   We will return today.    Clide Dales 03/13/2015, 11:25 AM  Clide Dales, PT Pager: [phone] 03/13/2015

## 2015-03-13 NOTE — Progress Notes (Signed)
ANTICOAGULATION CONSULT NOTE - Follow Up Consult  Pharmacy Consult for Warfarin Indication: atrial fibrillation  Allergies  Allergen Reactions  . Benadryl [Diphenhydramine Hcl] Nausea And Vomiting    Patient Measurements: Height: 5\' 9"  (175.3 cm) Weight: 179 lb 14.3 oz (81.6 kg) IBW/kg (Calculated) : 70.7   Vital Signs: Temp: 97.6 F (36.4 C) (02/20 0537) Temp Source: Oral (02/20 0537) BP: 130/58 mmHg (02/20 0537) Pulse Rate: 79 (02/20 0537)  Labs:  Recent Labs  03/11/15 0532 03/12/15 0553 03/13/15 0517  HGB 13.1  --   --   HCT 38.9*  --   --   PLT 122*  --   --   LABPROT 26.4* 26.0* 28.6*  INR 2.46* 2.42* 2.74*    Estimated Creatinine Clearance: 38 mL/min (by C-G formula based on Cr of 1.68).   Medications:  PTA warfarin: 2mg  daily except 3mg  on Wednesday and Saturday  Assessment: Pt is a 58 yoM on warfarin PTA for paroxysmal atrial fibrillation, presented to the ED on 2/11 with c/o cough and shortness of breath. Pharmacy is consulted to resume warfarin while inpatient and discharge recommendation.  INR on admission was therapeutic at 2.41.  Today, 03/13/2015: - INR 2.74, slight bump but remains therapeutic - CBC: Hgb improved and plt decreased (2/18) - No bleeding documented - Diet: Heart healthy diet, eating between 25-100% of meals - Drug-drug interactions: levaquin x 2 doses, then augmentin x 2 days, now d/c  Goal of Therapy:  INR 2-3 Monitor platelets by anticoagulation protocol: Yes   Plan:   For discharge planning, resume home dose of warfarin (2mg  daily except 3mg  on Wednesday and Saturday) and check INR this week.  Monitor for s/s of bleeding  If pt remains inpatient, recommend Warfarin 2mg  PO today at [place] PharmD Candidate 03/13/2015,7:54 AM

## 2015-03-13 NOTE — Progress Notes (Signed)
Physical Therapy Treatment Patient Details Name: Troy [name] MRN: [id number] DOB: Sep 02, 1940 Today's Date: 03/13/2015    History of Present Illness Patient 75 year old male with history of coronary artery disease,  ischemic cardiomyopathy, hypertension, DVT, diabetes mellitus, atrial fibrillation, chronic systolic heart failure, COPD, ejection fraction was 20-25% with diffuse hypokinesis.  He has a Best boy.  Recent admission 12/25/14 with SOB. Pt now admitted 03/05/15 with COPD exacerbation, flu negative, RSV panel pending.     PT Comments    Pt showing a decline in mobility ability. He states he is just not feeling good. He stated, I normally like to get up first thing in the morning and get to the recliner, and I didn't even want to do that.   Upon entering room , pt on 1 liter O2  continuous and at 88% . Once sat EOB increased pt to 2L with pursed lip breathing exercises and pt states did not increase that much, just to 90-91%. Noticed L arm with increased swelling and pt states it is tight as well. If pt was to DC at this point, feel he would benefit from ST-SNF for I would feel it would be difficult to manage everything on his own at this time. We will continue to follow pt while here on acute care.   Encourage OOB with nursing as he can tolerate to increase his activity tolerance.     Follow Up Recommendations  SNF     Equipment Recommendations   (pt states he owns a RW . )    Recommendations for Other Services OT consult (please order OT if you agree . )     Precautions / Restrictions Precautions Precautions: Fall Precaution Comments: monitor O2    Mobility  Bed Mobility Overal bed mobility: Needs Assistance Bed Mobility: Supine to Sit     Supine to sit: Min assist;HOB elevated     General bed mobility comments: Pt needed to use my arm to pull up on and HOB elevated and use of rail.   Transfers Overall transfer level: Needs assistance Equipment  used: Rolling walker (2 wheeled) Transfers: Sit to/from Stand Sit to Stand: Min assist         General transfer comment: Pt reproted not feeling well today , and noticable since he required more help today than before  Ambulation/Gait Ambulation/Gait assistance: Min guard Ambulation Distance (Feet): 80 Feet Assistive device: Rolling walker (2 wheeled) Gait Pattern/deviations: Step-to pattern     General Gait Details: pt would take 2-3 steps then a standing pasue to breathe, then proceed with 2-3 more steps. He did this his entire walk and even stated, " I will not be able to walk very far for I don't feel nromal, I don't feel good".    Stairs            Wheelchair Mobility    Modified Rankin (Stroke Patients Only)       Balance                                    Cognition Arousal/Alertness: Awake/alert Behavior During Therapy: WFL for tasks assessed/performed Overall Cognitive Status: Within Functional Limits for tasks assessed                      Exercises      General Comments        Pertinent Vitals/Pain Pain Assessment: No/denies pain  Home Living                      Prior Function            PT Goals (current goals can now be found in the care plan section) Acute Rehab PT Goals Patient Stated Goal: to go home  PT Goal Formulation: With patient Time For Goal Achievement: 03/20/15 Potential to Achieve Goals: Good Progress towards PT goals: Progressing toward goals    Frequency  Min 3X/week    PT Plan Discharge plan needs to be updated    Co-evaluation             End of Session Equipment Utilized During Treatment: Oxygen;Gait belt Activity Tolerance: Patient tolerated treatment well Patient left: in chair;with call bell/phone within reach;with chair alarm set     Time: 1510-1536 PT Time Calculation (min) (ACUTE ONLY): 26 min  Charges:  $Gait Training: 8-22 mins $Therapeutic Activity: 8-22  mins                    G CodesClide [name] 03/28/15, 4:00 PM  Troy [name], PT Pager: [phone] 03-28-2015

## 2015-03-13 NOTE — Progress Notes (Signed)
Patient ID: Troy [name], male   DOB: 05/28/1940, 75 y.o.   MRN: [id number] [facility] PROGRESS NOTE  Troy [name] [id number] DOB: March 29, 1940 DOA: 03/05/2015 PCP: Barbette Merino, MD  Brief narrative:    75 y.o. male with past medical history of CAD status post PCI to RCA, chronic atrial fibrillation on chronic anticoagulation with coumadin, hypertension, gastroesophageal reflux disease, type 2 diabetes, history of CHF, gout who presented to [facility] ED with a 5 day history of upper respiratory symptoms of productive cough of whitish sputum, congestion, rhinorrhea, worsening shortness of breath, chest tightness, wheezing. Patient was seen at [facility] emergency room 1 day prior to admission with similar symptoms, was prescribed doxycycline and prednisone and discharged.   In the ED, pt was hemodynamically stable. No acute findings on CXR. INR was 2.41. WBC count was 14.8, Cr 2.37. He was given IV solumedrol, continuous nebs and due to ongoing shortness of breath he was admitted for further evaluation.   Assessment/Plan:    Principal Problem: Acute respiratory failure with hypoxia / acute COPD exacerbation / cough - Influenza PCR is negative.  - RSV (+) - Chest x-ray 03/06/2015 negative for acute infiltrate.  - Stopped Augmentin and tamiflu 2/16 - Stopped prednisone 2/17 - Continue antitussives as needed for cough  - Continue to monitor resp status, intermittent hypoxia - Will stop brovana and pulmicort and will use duoneb every 6 hours scheduled and as needed for shortness of breath and /or wheezing   Active Problems: Acute on chronic kidney disease stage III - Likely in the setting of diuretics and ARB.  - ARB and diuretics on hold.  - BMP pending this am   Paroxysmal atrial fibrillation - CHADS vasc score 4 - Rate controlled with coreg and digoxin  - Continue coumadin for anticoagulation   Diabetes mellitus type 2 without complications without long term insulin  use - Hemoglobin A1c is 6.7.   Coronary artery disease/chronic systolic heart failure/status post ICD - Compensated   DVT Prophylaxis  - On full dose AC with coumadin  Code Status: DNR/DNI Family Communication: plan of care discussed with the patient Disposition Plan: Home 2/22   IV access:  Peripheral IV  Procedures and diagnostic studies:    X-ray Chest Pa And Lateral 03/06/2015 No acute abnormality seen. Electronically Signed By: Inez Catalina M.D. On: 03/06/2015 09:02   Dg Chest 2 View 03/05/2015 Hyperexpanded lungs without acute cardiopulmonary disease. Electronically Signed By: Sandi Mariscal M.D. On: 03/05/2015 13:51   US Renal 03/06/2015 Right renal increased echogenicity compatible with medical renal disease. No renal obstruction or hydronephrosis on either side. Enlarged prostate   IAnti-Infectives:   Levaquin 03/05/2015 --> 03/07/2015 Augmentin 03/07/2015 --> 03/09/15 Oseltamivir - Stopped 03/09/2015   Troy Lenz, MD  [facility] Pager [phone]  Time spent in minutes: 25 minutes  If 7PM-7AM, please contact night-coverage [email or web address] Password [email or web address] 03/13/2015, 1:04 PM   LOS: 8 days    HPI/Subjective: No acute overnight events. Patient hypoxic intermittently after exertion.   Objective: Filed Vitals:   03/12/15 2017 03/12/15 2126 03/13/15 0537 03/13/15 0948  BP: 135/78  130/58   Pulse: 85  79   Temp: 97.5 F (36.4 C)  97.6 F (36.4 C)   TempSrc: Oral  Oral   Resp: 19  18   Height:      Weight:   81.6 kg (179 lb 14.3 oz)   SpO2: 96% 96% 92% 97%    Intake/Output Summary (Last 24 hours) at 03/13/15 1304 Last data  filed at 03/13/15 0934  Gross per 24 hour  Intake    240 ml  Output   1250 ml  Net  -1010 ml    Exam:   General:  Pt is alert, awake   Cardiovascular: Rate controlled, S1/S2 (+)   Respiratory: Coarse sounds, mild wheezing in upper lung lobes   Abdomen: non tender, non distended   Extremities: No edema,  palpable pulses    Neuro: No focal deficits   Data Reviewed: Basic Metabolic Panel:  Recent Labs Lab 03/07/15 0526 03/08/15 0459 03/09/15 0507  NA 143 140 138  K 3.9 3.8 3.8  CL 109 110 111  CO2 24 23 23   GLUCOSE 147* 173* 148*  BUN 53* 53* 52*  CREATININE 2.03* 1.92* 1.68*  CALCIUM 9.0 8.5* 8.2*  MG 2.5*  --   --   PHOS 3.3  --   --    Liver Function Tests:  Recent Labs Lab 03/07/15 0526  ALBUMIN 3.0*   No results for input(s): LIPASE, AMYLASE in the last 168 hours. No results for input(s): AMMONIA in the last 168 hours. CBC:  Recent Labs Lab 03/07/15 0526 03/08/15 0459 03/11/15 0532  WBC 19.7* 17.7* 14.1*  HGB 12.2* 11.7* 13.1  HCT 36.6* 34.9* 38.9*  MCV 101.4* 101.5* 101.6*  PLT 144* 140* 122*   Cardiac Enzymes: No results for input(s): CKTOTAL, CKMB, CKMBINDEX, TROPONINI in the last 168 hours. BNP: Invalid input(s): POCBNP CBG:  Recent Labs Lab 03/12/15 1141 03/12/15 1635 03/12/15 2052 03/13/15 0742 03/13/15 1151  GLUCAP 143* 109* 120* 112* 134*    Recent Results (from the past 240 hour(s))  Urine culture     Status: None   Collection Time: 03/05/15  1:45 PM  Result Value Ref Range Status   Specimen Description URINE, CLEAN CATCH  Final   Special Requests NONE  Final   Culture   Final    NO GROWTH 1 DAY Performed at [facility]    Report Status 03/06/2015 FINAL  Final  Respiratory virus panel     Status: Abnormal   Collection Time: 03/06/15 10:44 AM  Result Value Ref Range Status   Respiratory Syncytial Virus A Positive (A) Negative Final   Respiratory Syncytial Virus B Negative Negative Final   Influenza A Negative Negative Final   Influenza B Negative Negative Final   Parainfluenza 1 Negative Negative Final   Parainfluenza 2 Negative Negative Final   Parainfluenza 3 Negative Negative Final   Metapneumovirus Negative Negative Final   Rhinovirus Negative Negative Final   Adenovirus Negative Negative Final    Comment:  (NOTE) Performed At: [facility] [address] [place], [place] [id number] Troy Romp MD [id number]   Culture, sputum-assessment     Status: None   Collection Time: 03/11/15  1:26 PM  Result Value Ref Range Status   Specimen Description SPUTUM  Final   Special Requests NONE  Final   Sputum evaluation THIS SPECIMEN IS ACCEPTABLE FOR SPUTUM CULTURE  Final   Report Status 03/11/2015 FINAL  Final  Culture, respiratory (NON-Expectorated)     Status: None (Preliminary result)   Collection Time: 03/11/15  1:26 PM  Result Value Ref Range Status   Specimen Description SPUTUM  Final   Special Requests NONE  Final   Gram Stain   Final    MODERATE WBC PRESENT,BOTH PMN AND MONONUCLEAR FEW SQUAMOUS EPITHELIAL CELLS PRESENT FEW YEAST Performed at [facility]    Culture   Final  MODERATE YEAST CONSISTENT WITH CANDIDA SPECIES Performed at [facility]    Report Status PENDING  Incomplete     Scheduled Meds: . alfuzosin  10 mg Oral Q breakfast  . allopurinol  100 mg Oral Daily  . arformoterol  15 mcg Nebulization BID  . atorvastatin  10 mg Oral QHS  . benzonatate  200 mg Oral TID  . budesonide (PULMICORT) nebulizer solution  0.25 mg Nebulization BID  . carvedilol  12.5 mg Oral BID WC  . chlorpheniramine-HYDROcodone  5 mL Oral Q12H  . digoxin  0.0313 mg Oral Daily  . hydrALAZINE  25 mg Oral TID  . insulin aspart  0-15 Units Subcutaneous TID WC  . loratadine  10 mg Oral Daily  . pantoprazole  40 mg Oral Daily  . sodium chloride flush  3 mL Intravenous Q12H  . Warfarin - Pharmacist Dosing Inpatient   Does not apply [phone]

## 2015-03-13 NOTE — Care Management Important Message (Signed)
Important Message  Patient Details  Name: Troy [name] MRN: [id number] Date of Birth: [birth year]   Medicare Important Message Given:  Yes    Camillo Flaming 03/13/2015, 3:05 [place] Message  Patient Details  Name: Troy [name] MRN: [id number] Date of Birth: [birth year]   Medicare Important Message Given:  Yes    Camillo Flaming 03/13/2015, 3:05 PM

## 2015-03-14 ENCOUNTER — Ambulatory Visit (HOSPITAL_COMMUNITY): Payer: Medicare HMO

## 2015-03-14 DIAGNOSIS — I10 Essential (primary) hypertension: Secondary | ICD-10-CM

## 2015-03-14 DIAGNOSIS — R609 Edema, unspecified: Secondary | ICD-10-CM

## 2015-03-14 LAB — CULTURE, RESPIRATORY

## 2015-03-14 LAB — GLUCOSE, CAPILLARY
GLUCOSE-CAPILLARY: 78 mg/dL (ref 65–99)
GLUCOSE-CAPILLARY: 139 mg/dL — AB (ref 65–99)
Glucose-Capillary: 133 mg/dL — ABNORMAL HIGH (ref 65–99)
Glucose-Capillary: 147 mg/dL — ABNORMAL HIGH (ref 65–99)

## 2015-03-14 LAB — BASIC METABOLIC PANEL
Anion gap: 4 — ABNORMAL LOW (ref 5–15)
BUN: 26 mg/dL — AB (ref 6–20)
CHLORIDE: 110 mmol/L (ref 101–111)
CO2: 27 mmol/L (ref 22–32)
CREATININE: 1.25 mg/dL — AB (ref 0.61–1.24)
Calcium: 8.4 mg/dL — ABNORMAL LOW (ref 8.9–10.3)
GFR calc Af Amer: >60 mL/min (ref >60)
GFR calc non Af Amer: 55 mL/min — ABNORMAL LOW (ref >60)
Glucose, Bld: 95 mg/dL (ref 65–99)
POTASSIUM: 4.3 mmol/L (ref 3.5–5.1)
SODIUM: 141 mmol/L (ref 135–145)

## 2015-03-14 LAB — CBC
HEMATOCRIT: 35.6 % — AB (ref 39.0–52.0)
HEMOGLOBIN: 11.9 g/dL — AB (ref 13.0–17.0)
MCH: 34 pg (ref 26.0–34.0)
MCHC: 33.4 g/dL (ref 30.0–36.0)
MCV: 101.7 fL — AB (ref 78.0–100.0)
Platelets: 104 10*3/uL — ABNORMAL LOW (ref 150–400)
RBC: 3.5 MIL/uL — AB (ref 4.22–5.81)
RDW: 15.5 % (ref 11.5–15.5)
WBC: 14.3 10*3/uL — ABNORMAL HIGH (ref 4.0–10.5)

## 2015-03-14 LAB — PROTIME-INR
INR: 2.9 — ABNORMAL HIGH (ref 0.00–1.49)
Prothrombin Time: 29.8 seconds — ABNORMAL HIGH (ref 11.6–15.2)

## 2015-03-14 LAB — CULTURE, RESPIRATORY W GRAM STAIN

## 2015-03-14 MED ORDER — WARFARIN SODIUM 2 MG PO TABS
2.0000 mg | ORAL_TABLET | Freq: Once | ORAL | Status: AC
  Administered 2015-03-14: 2 mg via ORAL
  Filled 2015-03-14: qty 1

## 2015-03-14 NOTE — Progress Notes (Signed)
Physical Therapy Treatment Patient Details Name: Troy [name] MRN: [id number] DOB: 1940/07/25 Today's Date: 03/14/2015    History of Present Illness Patient 75 year old male with history of coronary artery disease,  ischemic cardiomyopathy, hypertension, DVT, diabetes mellitus, atrial fibrillation, chronic systolic heart failure, COPD, ejection fraction was 20-25% with diffuse hypokinesis.  He has a Best boy.  Recent admission 12/25/14 with SOB. Pt now admitted 03/05/15 with COPD exacerbation, flu negative, RSV panel pending.     PT Comments    Pt performing better.  Amb a greater distance in hallway on RA avg sats 94%.  Pt lived home alone.  Never married.  No children.  Lives in an apt and still drives  Follow Up Recommendations  SNF per LPT eval (pt wants home)  D/C to home if cont to show progress   Equipment Recommendations       Recommendations for Other Services       Precautions / Restrictions Precautions Precautions: Fall Precaution Comments: monitor O2 Restrictions Weight Bearing Restrictions: No    Mobility  Bed Mobility               General bed mobility comments: Pt OOB in recliner  Transfers Overall transfer level: Needs assistance Equipment used: Rolling walker (2 wheeled) Transfers: Sit to/from Stand Sit to Stand: Min guard         General transfer comment: good safety cognition and use of hands  Ambulation/Gait Ambulation/Gait assistance: Supervision;Min guard Ambulation Distance (Feet): 115 Feet Assistive device: Rolling walker (2 wheeled) Gait Pattern/deviations: Step-through pattern;Decreased stride length;Trunk flexed Gait velocity: WFL   General Gait Details: amb an increased distance using RW on RA avg sats 94% with HR 88   Stairs            Wheelchair Mobility    Modified Rankin (Stroke Patients Only)       Balance                                    Cognition Arousal/Alertness:  Awake/alert Behavior During Therapy: WFL for tasks assessed/performed Overall Cognitive Status: Within Functional Limits for tasks assessed                      Exercises      General Comments        Pertinent Vitals/Pain Pain Assessment: No/denies pain    Home Living                      Prior Function            PT Goals (current goals can now be found in the care plan section) Progress towards PT goals: Progressing toward goals    Frequency  Min 3X/week    PT Plan Discharge plan needs to be updated    Co-evaluation             End of Session Equipment Utilized During Treatment: Gait belt Activity Tolerance: Patient tolerated treatment well Patient left: in chair;with call bell/phone within reach;with chair alarm set     Time: 1105-1135 PT Time Calculation (min) (ACUTE ONLY): 30 min  Charges:  $Gait Training: 8-22 mins $Therapeutic Activity: 8-22 mins                    G Codes:      Rica Koyanagi  PTA [facility]  Acute  Rehab Pager  [phone]   

## 2015-03-14 NOTE — Progress Notes (Signed)
ANTICOAGULATION CONSULT NOTE - Follow Up Consult  Pharmacy Consult for Warfarin Indication: atrial fibrillation  Allergies  Allergen Reactions  . Benadryl [Diphenhydramine Hcl] Nausea And Vomiting    Patient Measurements: Height: 5\' 9"  (175.3 cm) Weight: 184 lb 4.9 oz (83.6 kg) IBW/kg (Calculated) : 70.7   Vital Signs: Temp: 98.4 F (36.9 C) (02/21 0435) Temp Source: Oral (02/21 0435) BP: 134/45 mmHg (02/21 0435) Pulse Rate: 67 (02/21 0435)  Labs:  Recent Labs  03/12/15 0553 03/13/15 0517 03/13/15 1350 03/14/15 0526  HGB  --   --   --  11.9*  HCT  --   --   --  35.6*  PLT  --   --   --  104*  LABPROT 26.0* 28.6*  --  29.8*  INR 2.42* 2.74*  --  2.90*  CREATININE  --   --  1.43* 1.25*    Estimated Creatinine Clearance: 51.1 mL/min (by C-G formula based on Cr of 1.25).   Medications:  PTA warfarin: 2mg  daily except 3mg  on Wednesday and Saturday  Assessment: Pt is a 71 yoM on warfarin PTA for paroxysmal atrial fibrillation, presented to the ED on 2/11 with c/o cough and shortness of breath. Pharmacy is consulted to resume warfarin while inpatient and discharge recommendation.  INR on admission was therapeutic at 2.41.  Today, 03/14/2015: - INR 2.90, slight bump but remains therapeutic. Warfarin dose was missed last night. - CBC: Hgb 11.9 and plt low at 104K - No bleeding documented - Diet: Heart healthy diet, eating between 25-50% of meals - Drug-drug interactions: no further interacting drugs  Goal of Therapy:  INR 2-3 Monitor platelets by anticoagulation protocol: Yes   Plan:   Warfarin 2 mg dose this morning since patient missed dose last night.  For discharge planning, resume home dose of warfarin (2mg  daily except 3mg  on Wednesday and Saturday).  Hershal Coria  03/14/2015,7:44 AM

## 2015-03-14 NOTE — Progress Notes (Signed)
VASCULAR LAB PRELIMINARY  PRELIMINARY  PRELIMINARY  PRELIMINARY  Left upper extremity venous duplex completed.    Preliminary report:  Left:  No evidence of DVT.  Localized superficial thrombosis of the cephalic vein in the mid forearm.  Walls appear thickened elsewhere.  Severe interstitial fluid throughout the arm.   Juanangel Soderholm, RVT 03/14/2015, 12:20 PM

## 2015-03-14 NOTE — Progress Notes (Addendum)
Patient ID: Troy [name], male   DOB: Mar 16, 1940, 75 y.o.   MRN: [id number] [facility] PROGRESS NOTE  Troy [name] [id number] DOB: 1940-07-29 DOA: 03/05/2015 PCP: Barbette Merino, MD  Brief narrative:    75 y.o. male with past medical history of CAD status post PCI to RCA, chronic atrial fibrillation on chronic anticoagulation with coumadin, hypertension, gastroesophageal reflux disease, type 2 diabetes, history of CHF, gout who presented to [facility] ED with a 5 day history of upper respiratory symptoms of productive cough of whitish sputum, congestion, rhinorrhea, worsening shortness of breath, chest tightness, wheezing. Patient was seen at [facility] emergency room 1 day prior to admission with similar symptoms, was prescribed doxycycline and prednisone and discharged.   In the ED, pt was hemodynamically stable. No acute findings on CXR. INR was 2.41. WBC count was 14.8, Cr 2.37. He was given IV solumedrol, continuous nebs and due to ongoing shortness of breath he was admitted for further evaluation.   Assessment/Plan:    Principal Problem: Acute respiratory failure with hypoxia / acute COPD exacerbation / cough - Influenza PCR is negative.  - RSV (+) - Chest x-ray 03/06/2015 negative for acute infiltrate.  - Stopped Augmentin and tamiflu 2/16 - Stopped prednisone 2/17 - Continue DuoNeb 3 times daily scheduled and every 4 hours as needed for shortness of breath or wheezing - Continue guaifenesin every 6 hours as needed for cough  Active Problems: Acute on chronic kidney disease stage III - Likely in the setting of diuretics and ARB.  - ARB and diuretics on hold.  - Creatinine much better this morning, 1.25  Left upper extremity swelling - Obtain Doppler to rule out DVT  Essential hypertension - Losartan and Lasix are on hold until kidney function normalizes - He is on carvedilol 12.5 mg twice daily and digoxin - He is also on hydralazine 25 mg 3 times  daily  Paroxysmal atrial fibrillation - CHADS vasc score 4 - Rate controlled with coreg and digoxin  - Continue coumadin for anticoagulation   Diabetes mellitus type 2 without complications without long term insulin use - Hemoglobin A1c is 6.7.   Coronary artery disease/chronic systolic heart failure/status post ICD - Compensated   DVT Prophylaxis  - On full dose AC with coumadin - INR is 2.9 this morning  Code Status: DNR/DNI Family Communication: plan of care discussed with the patient Disposition Plan: Home 2/22, declined SNF placement    IV access:  Peripheral IV  Procedures and diagnostic studies:    X-ray Chest Pa And Lateral 03/06/2015 No acute abnormality seen. Electronically Signed By: Inez Catalina M.D. On: 03/06/2015 09:02   Dg Chest 2 View 03/05/2015 Hyperexpanded lungs without acute cardiopulmonary disease. Electronically Signed By: Sandi Mariscal M.D. On: 03/05/2015 13:51   US Renal 03/06/2015 Right renal increased echogenicity compatible with medical renal disease. No renal obstruction or hydronephrosis on either side. Enlarged prostate   IAnti-Infectives:   Levaquin 03/05/2015 --> 03/07/2015 Augmentin 03/07/2015 --> 03/09/15 Oseltamivir - Stopped 03/09/2015   Leisa Lenz, MD  [facility] Pager [phone]  Time spent in minutes: 25 minutes  If 7PM-7AM, please contact night-coverage [email or web address] Password [email or web address] 03/14/2015, 12:09 PM   LOS: 9 days    HPI/Subjective: No acute overnight events. Patient says he is still coughing a lot. He is now complaining about his left arm being swollen.  Objective: Filed Vitals:   03/13/15 2046 03/13/15 2105 03/14/15 0435 03/14/15 0825  BP: 150/57  134/45   Pulse: 69  67  Temp: 98.2 F (36.8 C)  98.4 F (36.9 C)   TempSrc: Oral  Oral   Resp: 12  19   Height:      Weight:   83.6 kg (184 lb 4.9 oz)   SpO2: 98% 98% 98% 97%    Intake/Output Summary (Last 24 hours) at 03/14/15 1209 Last data  filed at 03/14/15 [id number]  Gross per 24 hour  Intake    360 ml  Output    950 ml  Net   -590 ml    Exam:   General:  Pt is coughing, no distress   Cardiovascular: RRR, (+) S1, S2   Respiratory: Coarse sounds, no wheezing   Abdomen: (+) BS, non tender, non distended   Extremities: No lower extremity swelling, palpable pulses; his left arm does seem to be more swollen than the right arm  Neuro: Nonfocal   Data Reviewed: Basic Metabolic Panel:  Recent Labs Lab 03/08/15 0459 03/09/15 0507 03/13/15 1350 03/14/15 0526  NA 140 138 141 141  K 3.8 3.8 4.5 4.3  CL 110 111 111 110  CO2 23 23 27 27   GLUCOSE 173* 148* 150* 95  BUN 53* 52* 30* 26*  CREATININE 1.92* 1.68* 1.43* 1.25*  CALCIUM 8.5* 8.2* 8.6* 8.4*   Liver Function Tests: No results for input(s): AST, ALT, ALKPHOS, BILITOT, PROT, ALBUMIN in the last 168 hours. No results for input(s): LIPASE, AMYLASE in the last 168 hours. No results for input(s): AMMONIA in the last 168 hours. CBC:  Recent Labs Lab 03/08/15 0459 03/11/15 0532 03/14/15 0526  WBC 17.7* 14.1* 14.3*  HGB 11.7* 13.1 11.9*  HCT 34.9* 38.9* 35.6*  MCV 101.5* 101.6* 101.7*  PLT 140* 122* 104*   Cardiac Enzymes: No results for input(s): CKTOTAL, CKMB, CKMBINDEX, TROPONINI in the last 168 hours. BNP: Invalid input(s): POCBNP CBG:  Recent Labs Lab 03/13/15 0742 03/13/15 1151 03/13/15 1646 03/13/15 2044 03/14/15 0741  GLUCAP 112* 134* 231* 148* 78    Recent Results (from the past 240 hour(s))  Urine culture     Status: None   Collection Time: 03/05/15  1:45 PM  Result Value Ref Range Status   Specimen Description URINE, CLEAN CATCH  Final   Special Requests NONE  Final   Culture   Final    NO GROWTH 1 DAY Performed at [facility]    Report Status 03/06/2015 FINAL  Final  Respiratory virus panel     Status: Abnormal   Collection Time: 03/06/15 10:44 AM  Result Value Ref Range Status   Respiratory Syncytial Virus A  Positive (A) Negative Final   Respiratory Syncytial Virus B Negative Negative Final   Influenza A Negative Negative Final   Influenza B Negative Negative Final   Parainfluenza 1 Negative Negative Final   Parainfluenza 2 Negative Negative Final   Parainfluenza 3 Negative Negative Final   Metapneumovirus Negative Negative Final   Rhinovirus Negative Negative Final   Adenovirus Negative Negative Final    Comment: (NOTE) Performed At: [facility] [address] [place], [place] [id number] Lindon Romp MD [id number]   Culture, sputum-assessment     Status: None   Collection Time: 03/11/15  1:26 PM  Result Value Ref Range Status   Specimen Description SPUTUM  Final   Special Requests NONE  Final   Sputum evaluation THIS SPECIMEN IS ACCEPTABLE FOR SPUTUM CULTURE  Final   Report Status 03/11/2015 FINAL  Final  Culture, respiratory (NON-Expectorated)     Status: None  Collection Time: 03/11/15  1:26 PM  Result Value Ref Range Status   Specimen Description SPUTUM  Final   Special Requests NONE  Final   Gram Stain   Final    MODERATE WBC PRESENT,BOTH PMN AND MONONUCLEAR FEW SQUAMOUS EPITHELIAL CELLS PRESENT FEW YEAST Performed at [facility]    Culture   Final    MODERATE CANDIDA ALBICANS Performed at [facility]    Report Status 03/14/2015 FINAL  Final     Scheduled Meds: . alfuzosin  10 mg Oral Q breakfast  . allopurinol  100 mg Oral Daily  . atorvastatin  10 mg Oral QHS  . carvedilol  12.5 mg Oral BID WC  . digoxin  0.0313 mg Oral Daily  . hydrALAZINE  25 mg Oral TID  . insulin aspart  0-15 Units Subcutaneous TID WC  . ipratropium-albuterol  3 mL Nebulization TID  . loratadine  10 mg Oral Daily  . pantoprazole  40 mg Oral Daily  . sodium chloride flush  3 mL Intravenous Q12H  . Warfarin - Pharmacist Dosing Inpatient   Does not apply [phone]

## 2015-03-14 NOTE — Progress Notes (Signed)
CSW reviewed chart and noted PT recommendation changed yesterday to SNF.   CSW met with pt at bedside.   Pt adamantly declining rehab at SNF at this time. CSW explored if pt would at least be agreeable to exploring options, but pt not agreeable and states that he does not want to go to rehab. CSW discussed that RNCM can assist with home health at home.   CSW notified RNCM.  CSW signing off.   Please re-consult if social work needs arise.   Alison Murray, MSW, [place] Work [phone]

## 2015-03-15 DIAGNOSIS — B974 Respiratory syncytial virus as the cause of diseases classified elsewhere: Secondary | ICD-10-CM

## 2015-03-15 LAB — GLUCOSE, CAPILLARY
GLUCOSE-CAPILLARY: 97 mg/dL (ref 65–99)
GLUCOSE-CAPILLARY: 132 mg/dL — AB (ref 65–99)
GLUCOSE-CAPILLARY: 111 mg/dL — AB (ref 65–99)

## 2015-03-15 LAB — CBC
HCT: 35.1 % — ABNORMAL LOW (ref 39.0–52.0)
HEMOGLOBIN: 11.6 g/dL — AB (ref 13.0–17.0)
MCH: 33.7 pg (ref 26.0–34.0)
MCHC: 33 g/dL (ref 30.0–36.0)
MCV: 102 fL — AB (ref 78.0–100.0)
Platelets: 88 10*3/uL — ABNORMAL LOW (ref 150–400)
RBC: 3.44 MIL/uL — AB (ref 4.22–5.81)
RDW: 15.6 % — ABNORMAL HIGH (ref 11.5–15.5)
WBC: 12.7 10*3/uL — ABNORMAL HIGH (ref 4.0–10.5)

## 2015-03-15 LAB — PROTIME-INR
INR: 2.77 — ABNORMAL HIGH (ref 0.00–1.49)
Prothrombin Time: 28 seconds — ABNORMAL HIGH (ref 11.6–15.2)

## 2015-03-15 MED ORDER — WARFARIN SODIUM 2 MG PO TABS
2.0000 mg | ORAL_TABLET | Freq: Once | ORAL | Status: DC
  Filled 2015-03-15: qty 1

## 2015-03-15 NOTE — Clinical Social Work Placement (Signed)
   CLINICAL SOCIAL WORK PLACEMENT  NOTE  Date:  03/15/2015  Patient Details  Name: Troy [name] MRN: [id number] Date of Birth: 1940-02-23  Clinical Social Work is seeking post-discharge placement for this patient at the [place] level of care (*CSW will initial, date and re-position this form in  chart as items are completed):  Yes   Patient/family provided with [place] Work Department's list of facilities offering this level of care within the geographic area requested by the patient (or if unable, by the patient's family).  Yes   Patient/family informed of their freedom to choose among providers that offer the needed level of care, that participate in Medicare, Medicaid or managed care program needed by the patient, have an available bed and are willing to accept the patient.  Yes   Patient/family informed of [facility]'s ownership interest in [facility] and [facility], as well as of the fact that they are under no obligation to receive care at these facilities.  PASRR submitted to EDS on       PASRR number received on       Existing PASRR number confirmed on 03/15/15     FL2 transmitted to all facilities in geographic area requested by pt/family on 03/15/15     FL2 transmitted to all facilities within larger geographic area on       Patient informed that his/her managed care company has contracts with or will negotiate with certain facilities, including the following:        Yes   Patient/family informed of bed offers received.  Patient chooses bed at [place] [facility]     Physician recommends and patient chooses bed at      Patient to be transferred to [facility] [place] on 03/15/15.  Patient to be transferred to facility by ambulance Corey Harold)     Patient family notified on 03/15/15 of transfer.  Name of family member notified:  pt notified at bedside.      PHYSICIAN Please sign FL2     Additional Comment:     _______________________________________________ Ladell Pier, LCSW 03/15/2015, 4:23 PM

## 2015-03-15 NOTE — Progress Notes (Signed)
ANTICOAGULATION CONSULT NOTE - Follow Up Consult  Pharmacy Consult for Warfarin Indication: atrial fibrillation  Allergies  Allergen Reactions  . Benadryl [Diphenhydramine Hcl] Nausea And Vomiting    Patient Measurements: Height: 5\' 9"  (175.3 cm) Weight: 184 lb 1.4 oz (83.5 kg) IBW/kg (Calculated) : 70.7   Vital Signs: Temp: 98.2 F (36.8 C) (02/22 1334) Temp Source: Oral (02/22 1334) BP: 121/42 mmHg (02/22 1334) Pulse Rate: 76 (02/22 1334)  Labs:  Recent Labs  03/13/15 0517 03/13/15 1350 03/14/15 0526 03/15/15 0508  HGB  --   --  11.9* 11.6*  HCT  --   --  35.6* 35.1*  PLT  --   --  104* 88*  LABPROT 28.6*  --  29.8* 28.0*  INR 2.74*  --  2.90* 2.77*  CREATININE  --  1.43* 1.25*  --     Estimated Creatinine Clearance: 51.1 mL/min (by C-G formula based on Cr of 1.25).   Medications:  PTA warfarin: 2mg  daily except 3mg  on Wednesday and Saturday  Assessment: Pt is a 21 yoM on warfarin PTA for paroxysmal atrial fibrillation, presented to the ED on 2/11 with c/o cough and shortness of breath. Pharmacy is consulted to resume warfarin while inpatient and discharge recommendation.  INR on admission was therapeutic at 2.41.  Today, 03/15/2015: - INR therapeutic - CBC: Hgb low, stable, and plt low and trending down, today 88K.  - No bleeding or issues per RN - Diet: Heart healthy diet, eating between 25-50% of meals - Drug-drug interactions: no further interacting drugs  Goal of Therapy:  INR 2-3 Monitor platelets by anticoagulation protocol: Yes   Plan:   Warfarin 2mg  PO x 1 tonight (conservative due to platelets)  Daily PT/INR  F/u Plts  Ralene Bathe, PharmD, BCPS 03/15/2015, 1:41 PM  Pager: [phone]

## 2015-03-15 NOTE — Progress Notes (Signed)
CSW continuing to follow for discharge to SNF.   CSW followed up with pt at bedside to provide bed offers.  Pt chooses [facility] [place].   CSW contacted [facility] and facility confirmed that facility could accept pt. [facility] [place] will initiate [employer] authorization and facility is willing to accept 5 day Letter of Guarantee (LOG) until insurance authorization received in order for pt to discharge today as pt medically ready today.  CSW spoke with MD and MD confirmed pt stable for discharge today.   CSW to facilitate pt discharge needs this afternoon to [facility] [place].   Alison Murray, MSW, [place] Work [phone]

## 2015-03-15 NOTE — NC FL2 (Signed)
[place] LEVEL OF CARE SCREENING TOOL     IDENTIFICATION  Patient Name: Troy [name] Birthdate: 21-Jan-1941 Sex: male Admission Date (Current Location): 03/05/2015  [facility] and [place] Number:  Engineer, manufacturing systems and Address:  [facility],  [place] [address], [place]      Provider Number: [id number]  Attending Physician Name and Address:  Robbie Lis, MD  Relative Name and Phone Number:       Current Level of Care: Hospital Recommended Level of Care: [place] Prior Approval Number:    Date Approved/Denied:   PASRR Number: [id number]  Discharge Plan: SNF    Current Diagnoses: Patient Active Problem List   Diagnosis Date Noted  . Acute exacerbation of chronic obstructive pulmonary disease (COPD) ([place]) 03/05/2015  . Acute renal failure superimposed on stage 3 chronic kidney disease ([place]) 03/05/2015  . COPD with acute exacerbation ([place]) 03/05/2015  . Renal failure (ARF), acute on chronic (HCC)   . Stomach ache 01/18/2015  . COPD exacerbation ([place])   . AKI (acute kidney injury) ([place]) 12/26/2014  . BPH (benign prostatic hyperplasia) 12/25/2014  . FTT (failure to thrive) in adult 12/25/2014  . Encounter for therapeutic drug monitoring 08/05/2014  . Pulmonary nodules 12/23/2013  . Hemoptysis   . Dyspnea   . Acute on chronic systolic CHF (congestive heart failure) ([place])   . Chronic kidney disease, stage 3   . Coronary artery disease involving native coronary artery of native heart without angina pectoris   . Paroxysmal atrial fibrillation (HCC)   . Frequent PVCs   . Hypoxia   . Acute on chronic respiratory failure with hypoxia ([place])   . Chronic obstructive pulmonary disease with acute exacerbation ([place])   . COPD (chronic obstructive pulmonary disease) ([place]) 06/16/2013  . Lipoma of neck 09/11/2011  . 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 Coronary artery disease 01/30/2011   . Atrial fibrillation ([place]) 01/30/2011  . Chronic systolic CHF (congestive heart failure) ([place])   . Other primary cardiomyopathies   . Diverticulosis of colon with hemorrhage 08/28/2010    Orientation RESPIRATION BLADDER Height & Weight     Self, Time, Situation, Place  O2 (2L) Continent Weight: 184 lb 1.4 oz (83.5 kg) Height:  5\' 9"  (175.3 cm)  BEHAVIORAL SYMPTOMS/MOOD NEUROLOGICAL BOWEL NUTRITION STATUS   (no behaviors)  (NONE) Continent Diet (Diet Heart)  AMBULATORY STATUS COMMUNICATION OF NEEDS Skin   Limited Assist Verbally Normal                       Personal Care Assistance Level of Assistance  Bathing, Feeding, Dressing Bathing Assistance: Limited assistance Feeding assistance: Independent Dressing Assistance: Limited assistance     Functional Limitations Info  Sight, Hearing, Speech Sight Info: Impaired Hearing Info: Adequate Speech Info: Adequate    SPECIAL CARE FACTORS FREQUENCY  PT (By licensed PT), OT (By licensed OT)     PT Frequency: 5 x a week OT Frequency: 5 x a week            Contractures Contractures Info: Not present    Additional Factors Info  Code Status, Allergies Code Status Info: DNR code status Allergies Info: Benadryl           Current Medications (03/15/2015):  This is the current hospital active medication list Current Facility-Administered Medications  Medication Dose Route Frequency Provider Last Rate Last Dose  . acetaminophen (TYLENOL) tablet 650 mg  650 mg Oral  Q6H PRN Eugenie Filler, MD   650 mg at 03/08/15 2137   Or  . acetaminophen (TYLENOL) suppository 650 mg  650 mg Rectal Q6H PRN Eugenie Filler, MD      . alfuzosin (UROXATRAL) 24 hr tablet 10 mg  10 mg Oral Q breakfast Eugenie Filler, MD   10 mg at 03/15/15 0912  . allopurinol (ZYLOPRIM) tablet 100 mg  100 mg Oral Daily Eugenie Filler, MD   100 mg at 03/15/15 0912  . alum & mag hydroxide-simeth (MAALOX/MYLANTA) 200-200-20 MG/5ML suspension 30 mL   30 mL Oral Q6H PRN Eugenie Filler, MD   30 mL at 03/11/15 1857  . atorvastatin (LIPITOR) tablet 10 mg  10 mg Oral QHS Eugenie Filler, MD   10 mg at 03/14/15 2200  . carvedilol (COREG) tablet 12.5 mg  12.5 mg Oral BID WC Eugenie Filler, MD   12.5 mg at 03/15/15 0912  . digoxin (LANOXIN) 0.05 MG/ML solution 0.0313 mg  0.0313 mg Oral Daily Eugenie Filler, MD   0.0313 mg at 03/15/15 0912  . fluticasone (FLONASE) 50 MCG/ACT nasal spray 2 spray  2 spray Each Nare Daily PRN Irine Seal V, MD      . guaiFENesin (ROBITUSSIN) 100 MG/5ML solution 100 mg  5 mL Oral Q6H PRN Robbie Lis, MD   100 mg at 03/15/15 [id number]  . hydrALAZINE (APRESOLINE) tablet 25 mg  25 mg Oral TID Eugenie Filler, MD   25 mg at 03/15/15 0912  . insulin aspart (novoLOG) injection 0-15 Units  0-15 Units Subcutaneous TID WC Eugenie Filler, MD   2 Units at 03/14/15 1734  . ipratropium-albuterol (DUONEB) 0.5-2.5 (3) MG/3ML nebulizer solution 3 mL  3 mL Nebulization Q4H PRN Robbie Lis, MD      . ipratropium-albuterol (DUONEB) 0.5-2.5 (3) MG/3ML nebulizer solution 3 mL  3 mL Nebulization TID Robbie Lis, MD   3 mL at 03/14/15 2142  . loratadine (CLARITIN) tablet 10 mg  10 mg Oral Daily Eugenie Filler, MD   10 mg at 03/15/15 0912  . ondansetron (ZOFRAN) tablet 4 mg  4 mg Oral Q6H PRN Eugenie Filler, MD       Or  . ondansetron [facility]) injection 4 mg  4 mg Intravenous Q6H PRN Eugenie Filler, MD      . pantoprazole (PROTONIX) EC tablet 40 mg  40 mg Oral Daily Eugenie Filler, MD   40 mg at 03/15/15 0912  . senna-docusate (Senokot-S) tablet 1 tablet  1 tablet Oral QHS PRN Eugenie Filler, MD   1 tablet at 03/07/15 2342  . sodium chloride flush (NS) 0.9 % injection 3 mL  3 mL Intravenous Q12H Eugenie Filler, MD   3 mL at 03/13/15 2053  . sorbitol 70 % solution 30 mL  30 mL Oral Daily PRN Eugenie Filler, MD      . Warfarin - Pharmacist Dosing Inpatient   Does not apply q1800 Nilda Simmer, [facility]    Stopped at 03/08/15 1737     Discharge Medications: Please see discharge summary for a list of discharge medications.  Relevant Imaging Results:  Relevant Lab Results:   Additional Information SSN: [id number]  Maisley Hainsworth A, LCSW

## 2015-03-15 NOTE — Clinical Social Work Placement (Signed)
   CLINICAL SOCIAL WORK PLACEMENT  NOTE  Date:  03/15/2015  Patient Details  Name: Troy [name] MRN: [id number] Date of Birth: 28-Sep-1940  Clinical Social Work is seeking post-discharge placement for this patient at the [place] level of care (*CSW will initial, date and re-position this form in  chart as items are completed):  Yes   Patient/family provided with [place] Work Department's list of facilities offering this level of care within the geographic area requested by the patient (or if unable, by the patient's family).  Yes   Patient/family informed of their freedom to choose among providers that offer the needed level of care, that participate in Medicare, Medicaid or managed care program needed by the patient, have an available bed and are willing to accept the patient.  Yes   Patient/family informed of [facility]'s ownership interest in [facility] and [facility], as well as of the fact that they are under no obligation to receive care at these facilities.  PASRR submitted to EDS on       PASRR number received on       Existing PASRR number confirmed on 03/15/15     FL2 transmitted to all facilities in geographic area requested by pt/family on 03/15/15     FL2 transmitted to all facilities within larger geographic area on       Patient informed that his/her managed care company has contracts with or will negotiate with certain facilities, including the following:            Patient/family informed of bed offers received.  Patient chooses bed at       Physician recommends and patient chooses bed at      Patient to be transferred to   on  .  Patient to be transferred to facility by       Patient family notified on   of transfer.  Name of family member notified:        PHYSICIAN Please sign FL2     Additional Comment:    _______________________________________________ Ladell Pier, LCSW 03/15/2015, 2:33  PM

## 2015-03-15 NOTE — Discharge Summary (Addendum)
Physician Discharge Summary  Troy [name] [id number] DOB: 1940/07/12 DOA: 03/05/2015  PCP: Barbette Merino, MD  Admit date: 03/05/2015 Discharge date: 03/15/2015  Recommendations for Outpatient Follow-up:  Platelets are 88 today 2/22. Please hold coumadin for 2-3 days, recheck platelets and if platelets are above 100 please resume coumadin. INR today is 2.9.  If platelets are not 100 or above then continue to hold coumadin as risk of bleeding increases with thrombocytopenia.  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 male with past medical history of CAD status post PCI to RCA, chronic atrial fibrillation on chronic anticoagulation with coumadin, hypertension, gastroesophageal reflux disease, type 2 diabetes, history of CHF, gout who presented to [facility] ED with a 5 day history of upper respiratory symptoms of productive cough of whitish sputum, congestion, rhinorrhea, worsening shortness of breath, chest tightness, wheezing. Patient was seen at [facility] emergency room 1 day prior to admission with similar symptoms, was prescribed doxycycline and prednisone and discharged.   In the ED, pt was hemodynamically stable. No acute findings on CXR. INR was 2.41. WBC count was 14.8, Cr 2.37. He was given IV solumedrol, continuous nebs and due to  ongoing shortness of breath he was admitted for further evaluation.   Hospital Course:   Assessment/Plan:    Principal Problem: Acute respiratory failure with hypoxia / acute COPD exacerbation / cough - Influenza PCR is negative.  - RSV (+) - Chest x-ray 03/06/2015 negative for acute infiltrate.  - Stopped Augmentin and tamiflu 2/16 - Stopped prednisone 2/17 - Stable respiratory status   Active Problems: Acute on chronic kidney disease stage III - Likely in the setting of diuretics and ARB.  - ARB and diuretics on hold.  - Creatinine 1.25 prior to discharge   Left upper extremity swelling - He has superficial thrombosis ut no DVT  Essential hypertension - Losartan and Lasix are on hold until kidney function normalizes - Continue carvedilol 12.5 mg twice daily and digoxin - Continue aldactone and hydralazine 25 mg 3 times daily  Paroxysmal atrial fibrillation - CHADS vasc score 4 - Rate controlled with coreg and digoxin  - Coumadin on hold on discharge due to thrombocytopenia   Diabetes mellitus type 2 without complications without long term insulin use - Hemoglobin A1c is 6.7.   Coronary artery disease/chronic systolic heart failure/status post ICD - Compensated   DVT Prophylaxis  - On full dose AC with coumadin but will hold on discharge due to thrombocytopenia   Code Status: DNR/DNI Family Communication: plan of care discussed with the patient    IV access:  Peripheral IV  Procedures and diagnostic studies:   X-ray Chest Pa And Lateral 03/06/2015 No acute abnormality seen. Electronically Signed By: Inez Catalina M.D. On: 03/06/2015 09:02   Dg Chest 2 View 03/05/2015 Hyperexpanded lungs without acute cardiopulmonary disease. Electronically Signed By: Sandi Mariscal M.D. On: 03/05/2015 13:51   US  Renal 03/06/2015 Right renal increased echogenicity compatible with medical renal disease. No renal obstruction or hydronephrosis on either  side. Enlarged prostate   IAnti-Infectives:   Levaquin 03/05/2015 --> 03/07/2015 Augmentin 03/07/2015 --> 03/09/15 Oseltamivir - Stopped 03/09/2015  Signed:  Leisa Lenz, MD  [facility] 03/15/2015, 4:05 PM  Pager #: [phone]  Time spent in minutes: more than 30 minutes   Discharge Exam: Filed Vitals:   03/15/15 0556 03/15/15 1334  BP: 116/42 121/42  Pulse: 70 76  Temp: 97.8 F (36.6 C) 98.2 F (36.8 C)  Resp: 20 20   Filed Vitals:   03/14/15 2127 03/15/15 0556 03/15/15 1115 03/15/15 1334  BP: 147/50 116/42  121/42  Pulse: 63 70  76  Temp: 98 F (36.7 C) 97.8 F (36.6 C)  98.2 F (36.8 C)  TempSrc: Oral Oral  Oral  Resp: 20 20  20   Height:      Weight:  83.5 kg (184 lb 1.4 oz)    SpO2: 95% 96% 97% 97%    General: Pt is alert, follows commands appropriately, not in acute distress Cardiovascular: Regular rate and rhythm, S1/S2 +, no murmurs Respiratory: Clear to auscultation bilaterally, no wheezing, no crackles, no rhonchi Abdominal: Soft, non tender, non distended, bowel sounds +, no guarding Extremities: no edema, no cyanosis, pulses palpable bilaterally DP and PT Neuro: Grossly nonfocal  Discharge Instructions  Discharge Instructions    Call MD for:  difficulty breathing, headache or visual disturbances    Complete by:  As directed      Call MD for:  difficulty breathing, headache or visual disturbances    Complete by:  As directed      Call MD for:  persistant dizziness or light-headedness    Complete by:  As directed      Call MD for:  persistant dizziness or light-headedness    Complete by:  As directed      Call MD for:  persistant nausea and vomiting    Complete by:  As directed      Call MD for:  persistant nausea and vomiting    Complete by:  As directed      Call MD for:  severe uncontrolled pain    Complete by:  As directed      Call MD for:  severe uncontrolled pain    Complete by:  As directed      Diet - low sodium heart  healthy    Complete by:  As directed      Diet - low sodium heart healthy    Complete by:  As directed      Discharge instructions    Complete by:  As directed   Platelets are 88 today 2/22. Please hold coumadin for 2-3 days, recheck platelets and if platelets are above 100 please resume coumadin. INR today is 2.7 If platelets are not 100 or above then continue to hold coumadin as risk of bleeding increases with thrombocytopenia.     Increase activity slowly    Complete by:  As directed      Increase activity slowly    Complete by:  As directed             Medication List    STOP taking these medications        Digoxin 62.5 MCG Tabs  Replaced by:  digoxin 0.05 MG/ML solution     doxycycline 100 MG capsule  Commonly known as:  VIBRAMYCIN     furosemide 40 MG  tablet  Commonly known as:  LASIX     guaiFENesin-dextromethorphan 100-10 MG/5ML syrup  Commonly known as:  ROBITUSSIN DM     losartan 50 MG tablet  Commonly known as:  COZAAR     potassium chloride SA 20 MEQ tablet  Commonly known as:  K-DUR,KLOR-CON     predniSONE 20 MG tablet  Commonly known as:  DELTASONE     warfarin 1 MG tablet  Commonly known as:  COUMADIN      TAKE these medications        acetaminophen 500 MG tablet  Commonly known as:  TYLENOL  Take 500 mg by mouth every 6 (six) hours as needed for mild pain or moderate pain.     alfuzosin 10 MG 24 hr tablet  Commonly known as:  UROXATRAL  Take 10 mg by mouth daily with breakfast.     allopurinol 100 MG tablet  Commonly known as:  ZYLOPRIM  Take 100 mg by mouth daily.     atorvastatin 10 MG tablet  Commonly known as:  LIPITOR  Take 1 tablet (10 mg total) by mouth at bedtime.     carvedilol 12.5 MG tablet  Commonly known as:  COREG  Take 1 tablet (12.5 mg total) by mouth 2 (two) times daily with a meal.     digoxin 0.05 MG/ML solution  Commonly known as:  LANOXIN  Take 0.6 mLs (0.03 mg total) by mouth daily.     fluticasone 50 MCG/ACT  nasal spray  Commonly known as:  FLONASE  Place 2 sprays into both nostrils daily as needed for allergies.     guaiFENesin 100 MG/5ML Soln  Commonly known as:  ROBITUSSIN  Take 5 mLs (100 mg total) by mouth every 6 (six) hours as needed for cough or to loosen phlegm.     hydrALAZINE 25 MG tablet  Commonly known as:  APRESOLINE  TAKE ONE TABLET BY MOUTH THREE TIMES DAILY.     levalbuterol 0.63 MG/3ML nebulizer solution  Commonly known as:  XOPENEX  Take 3 mLs (0.63 mg total) by nebulization every 6 (six) hours as needed for wheezing or shortness of breath.     loratadine 10 MG tablet  Commonly known as:  CLARITIN  Take 10 mg by mouth daily. For allergies     mometasone-formoterol 100-5 MCG/ACT Aero  Commonly known as:  DULERA  Inhale 2 puffs into the lungs 2 (two) times daily.     omeprazole 40 MG capsule  Commonly known as:  PRILOSEC  Take 40 mg by mouth at bedtime.     PROAIR HFA 108 (90 Base) MCG/ACT inhaler  Generic drug:  albuterol  INHALE 2 PUFFS EVERY 4 HOURS AS NEEDED FOR WHEEZING OR SHORTNESS OF BREATH.     senna-docusate 8.6-50 MG tablet  Commonly known as:  Senokot-S  Take 1 tablet by mouth at bedtime as needed for mild constipation.     spironolactone 25 MG tablet  Commonly known as:  ALDACTONE  Take 1 tablet (25 mg total) by mouth daily.           Follow-up Information    Follow up with [facility], [place]. Schedule an appointment as soon as possible for a visit in 2 weeks.   Specialty:  Internal Medicine   Why:  Follow up appt after recent hospitalization   Contact information:   [address]. [place] [address] [phone]        The results of significant diagnostics from this hospitalization (including  imaging, microbiology, ancillary and laboratory) are listed below for reference.    Significant Diagnostic Studies: X-ray Chest Pa And Lateral  03/06/2015  CLINICAL DATA:  Shortness of Breath EXAM: CHEST  2 VIEW COMPARISON:  03/05/2015  FINDINGS: Cardiac shadow is stable. A defibrillator is again noted. The lungs are well aerated bilaterally. No focal infiltrate or sizable effusion is seen. No acute bony abnormality is noted. IMPRESSION: No acute abnormality seen. Electronically Signed   By: Inez Catalina M.D.   On: 03/06/2015 09:02   Dg Chest 2 View  03/05/2015  CLINICAL DATA:  Cough congestion, weakness shortness of breath for 5 days. Worsening this morning. EXAM: CHEST  2 VIEW COMPARISON:  03/04/2015; 03/04/2014; 12/25/2014 FINDINGS: Grossly unchanged cardiac silhouette and mediastinal contours. Atherosclerotic plaque within the thoracic aorta. Stable position of support apparatus. The lungs remain hyperexpanded with flattening of the diaphragms. No focal airspace opacities. No pleural effusion pneumothorax. No evidence of edema. Old right-sided sixth rib fracture. No acute osseus abnormalities. IMPRESSION: Hyperexpanded lungs without acute cardiopulmonary disease. Electronically Signed   By: Sandi Mariscal M.D.   On: 03/05/2015 13:51   Dg Chest 2 View  03/04/2015  CLINICAL DATA:  75 year old male with cough and shortness of breath for 2 days. EXAM: CHEST  2 VIEW COMPARISON:  01/02/2015 and prior exams FINDINGS: Cardiomegaly and left-sided ICD/ pacemaker again noted. COPD/emphysema again noted. There is no evidence of focal airspace disease, pulmonary edema, suspicious pulmonary nodule/mass, pleural effusion, or pneumothorax. No acute bony abnormalities are identified. IMPRESSION: No evidence of acute cardiopulmonary disease. COPD/ emphysema and cardiomegaly. Electronically Signed   By: Margarette Canada M.D.   On: 03/04/2015 12:51   US Renal  03/06/2015  CLINICAL DATA:  Acute on chronic renal failure EXAM: RENAL / URINARY TRACT ULTRASOUND COMPLETE COMPARISON:  None of a FINDINGS: Right Kidney: Length: 9.8 cm. Mild increased cortical echogenicity and cortical thinning/ atrophy. No renal obstruction or hydronephrosis. No focal abnormality or mass.  Left Kidney: Length: 10.5 cm. Mild cortical thinning but normal echogenicity. No acute process, hydronephrosis or focal abnormality. No mass evident. Bladder: Normal appearance for distension. Prostate is enlarged measuring 4.7 cm impressing upon the bladder base. IMPRESSION: Right renal increased echogenicity compatible with medical renal disease. No renal obstruction or hydronephrosis on either side. Enlarged prostate Electronically Signed   By: Jerilynn Mages.  Shick M.D.   On: 03/06/2015 21:31    Microbiology: Recent Results (from the past 240 hour(s))  Respiratory virus panel     Status: Abnormal   Collection Time: 03/06/15 10:44 AM  Result Value Ref Range Status   Respiratory Syncytial Virus A Positive (A) Negative Final   Respiratory Syncytial Virus B Negative Negative Final   Influenza A Negative Negative Final   Influenza B Negative Negative Final   Parainfluenza 1 Negative Negative Final   Parainfluenza 2 Negative Negative Final   Parainfluenza 3 Negative Negative Final   Metapneumovirus Negative Negative Final   Rhinovirus Negative Negative Final   Adenovirus Negative Negative Final    Comment: (NOTE) Performed At: [facility] [address] [place], [place] [id number] Lindon Romp MD [id number]   Culture, sputum-assessment     Status: None   Collection Time: 03/11/15  1:26 PM  Result Value Ref Range Status   Specimen Description SPUTUM  Final   Special Requests NONE  Final   Sputum evaluation THIS SPECIMEN IS ACCEPTABLE FOR SPUTUM CULTURE  Final   Report Status 03/11/2015 FINAL  Final  Culture, respiratory (NON-Expectorated)  Status: None   Collection Time: 03/11/15  1:26 PM  Result Value Ref Range Status   Specimen Description SPUTUM  Final   Special Requests NONE  Final   Gram Stain   Final    MODERATE WBC PRESENT,BOTH PMN AND MONONUCLEAR FEW SQUAMOUS EPITHELIAL CELLS PRESENT FEW YEAST Performed at [facility]    Culture   Final    MODERATE  CANDIDA ALBICANS Performed at [facility]    Report Status 03/14/2015 FINAL  Final     Labs: Basic Metabolic Panel:  Recent Labs Lab 03/09/15 0507 03/13/15 1350 03/14/15 0526  NA 138 141 141  K 3.8 4.5 4.3  CL 111 111 110  CO2 23 27 27   GLUCOSE 148* 150* 95  BUN 52* 30* 26*  CREATININE 1.68* 1.43* 1.25*  CALCIUM 8.2* 8.6* 8.4*   Liver Function Tests: No results for input(s): AST, ALT, ALKPHOS, BILITOT, PROT, ALBUMIN in the last 168 hours. No results for input(s): LIPASE, AMYLASE in the last 168 hours. No results for input(s): AMMONIA in the last 168 hours. CBC:  Recent Labs Lab 03/11/15 0532 03/14/15 0526 03/15/15 0508  WBC 14.1* 14.3* 12.7*  HGB 13.1 11.9* 11.6*  HCT 38.9* 35.6* 35.1*  MCV 101.6* 101.7* 102.0*  PLT 122* 104* 88*   Cardiac Enzymes: No results for input(s): CKTOTAL, CKMB, CKMBINDEX, TROPONINI in the last 168 hours. BNP: BNP (last 3 results)  Recent Labs  12/25/14 0807 01/04/15 0825 03/05/15 1605  BNP 1078.9* 188.0* 220.7*    ProBNP (last 3 results)  Recent Labs  04/25/14 1205 05/02/14 1115  PROBNP 2433.0* 1499.0*    CBG:  Recent Labs Lab 03/14/15 1159 03/14/15 1626 03/14/15 2124 03/15/15 0732 03/15/15 1141  GLUCAP 133* 147* 139* 97 132*

## 2015-03-15 NOTE — Progress Notes (Signed)
Pt for discharge to [place] [facility].   CSW facilitated pt discharge needs including contacting facility, faxing pt discharge information via epic hub, sent LOG request for LOG while awaiting insurance auth, discussing with pt at bedside, providing RN phone number to call report, and arranging ambulance transport for pt to [facility] at [place].  No further social work needs identified at this time.  CSW signing off.   Alison Murray, MSW, [place] Work [phone]

## 2015-03-15 NOTE — Clinical Social Work Note (Signed)
Clinical Social Work Assessment  Patient Details  Name: ADEYEMI HAMAD MRN: [id number] Date of Birth: 11-Jan-1941  Date of referral:  03/15/15               Reason for consult:  Discharge Planning                Permission sought to share information with:    Permission granted to share information::  No  Name::        Agency::     Relationship::     Contact Information:     Housing/Transportation Living arrangements for the past 2 months:  Apartment Source of Information:  Patient Patient Interpreter Needed:  None Criminal Activity/Legal Involvement Pertinent to Current Situation/Hospitalization:  No - Comment as needed Significant Relationships:  Friend Lives with:  Self Do you feel safe going back to the place where you live?  No Need for family participation in patient care:  No (Coment)  Care giving concerns:  CSW received notification from MD that pt now wanting SNF for rehab and agreeable to exploring SNF options.    Social Worker assessment / plan:    CSW received referral that pt now agreeable to SNF. Pt had been declining SNF the past few days. CSW met with pt at bedside and confirmed he is agreeable to SNF. CSW discussed with pt process of SNF search and pt agreeable to [facility] search. CSW discussed with pt about need for pt to apply for Medicaid as pt would benefit from [facility] for longer term planning needs. Pt reports that he does not have family for support and wants to remain independent for as long as possible, but does agree that he may benefit from applying for Medicaid.   CSW completed FL2 and initiated SNF search to [facility]. Pt has [employer] with requires authorization prior to pt discharge.   CSW to follow up with pt re: SNF bed offers.   CSW to continue to follow to provide support and assist with pt discharge planning needs.  Employment status:  Retired Nurse, adult PT Recommendations:  [place] / Referral to community resources:  [place]  Patient/Family's Response to care:  Pt alert and oriented x 4. Pt now realizes that he cannot return home. Pt agreeable to rehab at SNF.   Patient/Family's Understanding of and Emotional Response to Diagnosis, Current Treatment, and Prognosis:  Pt recognizes that he has a chronic medical condition that will likely continue to limit his ability to care for himself at home. Pt agreeable to exploring applying for Medicaid from rehab facility as pt primary insurance will only provide rehab coverage for a short amount of time.   Emotional Assessment Appearance:  Appears stated age Attitude/Demeanor/Rapport:  Other (pt cooperative) Affect (typically observed):  Appropriate Orientation:  Oriented to Self, Oriented to Place, Oriented to  Time, Oriented to Situation Alcohol / Substance use:  Not Applicable Psych involvement (Current and /or in the community):  No (Comment)  Discharge Needs  Concerns to be addressed:  Discharge Planning Concerns Readmission within the last 30 days:  No Current discharge risk:  Lack of support system Barriers to Discharge:      Ladell Pier, LCSW 03/15/2015, 2:28 PM  [phone]

## 2015-04-13 ENCOUNTER — Other Ambulatory Visit (HOSPITAL_COMMUNITY): Payer: Self-pay | Admitting: Hospice and Palliative Medicine

## 2015-04-13 DIAGNOSIS — I509 Heart failure, unspecified: Secondary | ICD-10-CM

## 2015-04-18 ENCOUNTER — Ambulatory Visit (HOSPITAL_COMMUNITY): Payer: Medicare HMO

## 2015-04-20 ENCOUNTER — Ambulatory Visit (INDEPENDENT_AMBULATORY_CARE_PROVIDER_SITE_OTHER): Payer: Medicare HMO | Admitting: *Deleted

## 2015-04-20 ENCOUNTER — Encounter: Payer: Self-pay | Admitting: Internal Medicine

## 2015-04-20 DIAGNOSIS — I480 Paroxysmal atrial fibrillation: Secondary | ICD-10-CM | POA: Diagnosis not present

## 2015-04-20 DIAGNOSIS — I493 Ventricular premature depolarization: Secondary | ICD-10-CM | POA: Diagnosis not present

## 2015-04-20 DIAGNOSIS — I5022 Chronic systolic (congestive) heart failure: Secondary | ICD-10-CM | POA: Diagnosis not present

## 2015-04-20 LAB — CUP PACEART INCLINIC DEVICE CHECK
Battery Remaining Longevity: 40 mo
Brady Statistic RA Percent Paced: 38 %
Brady Statistic RV Percent Paced: 88 %
Date Time Interrogation Session: 20170330131158
HIGH POWER IMPEDANCE MEASURED VALUE: 40 Ohm
Implantable Lead Implant Date: 20120801
Implantable Lead Location: 753858
Implantable Lead Location: 753859
Lead Channel Impedance Value: 400 Ohm
Lead Channel Impedance Value: 410 Ohm
Lead Channel Pacing Threshold Amplitude: 0.75 V
Lead Channel Pacing Threshold Pulse Width: 0.4 ms
Lead Channel Pacing Threshold Pulse Width: 0.5 ms
Lead Channel Sensing Intrinsic Amplitude: 5 mV
Lead Channel Setting Sensing Sensitivity: 0.5 mV
MDC IDC LEAD IMPLANT DT: 20120801
MDC IDC LEAD IMPLANT DT: 20120801
MDC IDC LEAD LOCATION: 753860
MDC IDC MSMT BATTERY REMAINING PERCENTAGE: 46 %
MDC IDC MSMT LEADCHNL LV IMPEDANCE VALUE: 840 Ohm
MDC IDC MSMT LEADCHNL LV PACING THRESHOLD AMPLITUDE: 0.75 V
MDC IDC MSMT LEADCHNL RV PACING THRESHOLD AMPLITUDE: 0.75 V
MDC IDC MSMT LEADCHNL RV PACING THRESHOLD PULSEWIDTH: 0.5 ms
MDC IDC SET LEADCHNL LV PACING AMPLITUDE: 2 V
MDC IDC SET LEADCHNL LV PACING PULSEWIDTH: 0.5 ms
MDC IDC SET LEADCHNL RA PACING AMPLITUDE: 1.75 V
MDC IDC SET LEADCHNL RV PACING AMPLITUDE: 2 V
MDC IDC SET LEADCHNL RV PACING PULSEWIDTH: 0.5 ms
Pulse Gen Serial Number: 7001284

## 2015-04-20 NOTE — Progress Notes (Signed)
CRT-D device check in office. Stable device pocket. Thresholds and sensing consistent with previous device measurements. Lead impedance trends stable over time. 2632 mode switch episodes recorded (7.9%)--max dur. 14 hrs 44 mins, Max A 640 + warfarin. No ventricular arrhythmia episodes recorded. Patient bi-ventricularly pacing 88% of the time. Device programmed with appropriate safety margins. Heart failure diagnostics reviewed and trends are stable for patient. No changes made this session. Estimated longevity 3.4 years.  Patient will follow up with the Device clinic in 3 months.

## 2015-04-25 ENCOUNTER — Ambulatory Visit (INDEPENDENT_AMBULATORY_CARE_PROVIDER_SITE_OTHER): Payer: Medicare HMO | Admitting: *Deleted

## 2015-04-25 DIAGNOSIS — I82409 Acute embolism and thrombosis of unspecified deep veins of unspecified lower extremity: Secondary | ICD-10-CM | POA: Diagnosis not present

## 2015-04-25 DIAGNOSIS — I480 Paroxysmal atrial fibrillation: Secondary | ICD-10-CM

## 2015-04-25 DIAGNOSIS — Z5181 Encounter for therapeutic drug level monitoring: Secondary | ICD-10-CM | POA: Diagnosis not present

## 2015-04-25 LAB — POCT INR: INR: 3.4

## 2015-05-16 ENCOUNTER — Ambulatory Visit (INDEPENDENT_AMBULATORY_CARE_PROVIDER_SITE_OTHER): Payer: Medicare HMO | Admitting: *Deleted

## 2015-05-16 DIAGNOSIS — I480 Paroxysmal atrial fibrillation: Secondary | ICD-10-CM | POA: Diagnosis not present

## 2015-05-16 DIAGNOSIS — Z5181 Encounter for therapeutic drug level monitoring: Secondary | ICD-10-CM | POA: Diagnosis not present

## 2015-05-16 DIAGNOSIS — I82409 Acute embolism and thrombosis of unspecified deep veins of unspecified lower extremity: Secondary | ICD-10-CM

## 2015-05-16 LAB — POCT INR: INR: 2.1

## 2015-05-16 MED ORDER — WARFARIN SODIUM 1 MG PO TABS: ORAL_TABLET | ORAL | Status: DC

## 2015-05-22 ENCOUNTER — Telehealth: Payer: Self-pay | Admitting: *Deleted

## 2015-05-22 NOTE — Telephone Encounter (Signed)
Attempted to call pt back.  Phone rang several times with no answer and no VM.  Unable to leave message.

## 2015-05-22 NOTE — Telephone Encounter (Signed)
Patient is confused about his medication since his hospital discharge. He is still taking lasix 40 mg which was discontinued at discharge. He states he doesn't have spironolactone whish he is suppose to be taking and is on his list. Please advise him as he is about out of his medication.

## 2015-05-23 NOTE — Telephone Encounter (Signed)
I called and spoke with the patient. He was hospitalized in February with a COPD exacerbation. He has a history of CHF with Bi-V ICD in place. Per the patient's discharge summary dated- 03/15/15, he was to discontinue lasix and start spironolactone 25 mg once daily due to elevated renal function. In speaking with the patient today, he has been in a nursing facility in [place] (he is unsure of which one) until about 1 month ago. His question today is what dose of lasix does [name] want him to take. Per the patient, he was not on spironolactone in the nursing facility. He was on lasix 40 mg in the AM and 20 mg in the PM. He has run out of his 20 mg tablets of lasix in the last few days. He states he only has 40 mg tablets of lasix and he has been taking 40 mg BID.  I advised the patient the he can split a 40 mg tablet in the evening. He states that he "doesn't want to gain any fluid."  He denies swelling or SOB at this time- he does not weigh. He is adament that he wants to know from [name] if he should take lasix 40 mg BID or 40mg  (am)/ 20mg  (pm). I advised the patient that I would forward to [name] to review, but that it may be hard to determine what he should be on without follow/ labs. He is due to see Dr. Jonelle Sidle tomorrow.

## 2015-05-23 NOTE — Telephone Encounter (Signed)
OK to stay on Lasix 40 mg BID if he is not on Spironolactone.

## 2015-05-23 NOTE — Telephone Encounter (Signed)
The patient is aware of Dr. Hassell Done recommendations.

## 2015-05-23 NOTE — Addendum Note (Signed)
Addended by: Alvis Lemmings C on: 05/23/2015 11:20 AM   Modules accepted: Orders, Medications

## 2015-05-31 ENCOUNTER — Other Ambulatory Visit: Payer: Self-pay

## 2015-05-31 ENCOUNTER — Telehealth: Payer: Self-pay | Admitting: Interventional Cardiology

## 2015-05-31 MED ORDER — HYDRALAZINE HCL 25 MG PO TABS: 25.0000 mg | ORAL_TABLET | Freq: Three times a day (TID) | ORAL | Status: DC

## 2015-05-31 NOTE — Telephone Encounter (Signed)
NeW Message  Pt requestd to speak w/ RN- discuss meds- hydralazine and another medication- could not remember name. Please call back and discuss.

## 2015-05-31 NOTE — Telephone Encounter (Signed)
The pt is requesting refills on his Hydralazine and is questioning if he is to take Spironolactone as directed at his last hosp d/c. He is advised not to take Spironolactone as Dr Irish Lack has advised that he go back on Furosemide 40 mg BID. He verbalized understanding. Hydralazine RX sent to [facility] in [place] to fill per the pts request.

## 2015-05-31 NOTE — Telephone Encounter (Signed)
**Note De-identified Tamberly Pomplun Obfuscation** No answer and no way to leave a message. Will continue to call. 

## 2015-06-08 ENCOUNTER — Other Ambulatory Visit: Payer: Self-pay | Admitting: Nurse Practitioner

## 2015-06-08 NOTE — Telephone Encounter (Signed)
Patient called about this medication because he is unsure if he is still supposed to be taking it. He wants Dr Irish Lack to advise. Thanks, MI

## 2015-06-09 NOTE — Telephone Encounter (Signed)
The pt was previously taking Potassium 20 meq daily. He is taking Furosemide 40 mg BID.  Please advise.

## 2015-06-12 ENCOUNTER — Telehealth: Payer: Self-pay | Admitting: Interventional Cardiology

## 2015-06-12 DIAGNOSIS — I5022 Chronic systolic (congestive) heart failure: Secondary | ICD-10-CM

## 2015-06-12 DIAGNOSIS — I480 Paroxysmal atrial fibrillation: Secondary | ICD-10-CM

## 2015-06-12 DIAGNOSIS — I2510 Atherosclerotic heart disease of native coronary artery without angina pectoris: Secondary | ICD-10-CM

## 2015-06-12 NOTE — Telephone Encounter (Signed)
Pt calling to verify his digoxin dose. Pt states he it taking  digoxin 0.125mg  daily. Pt states insurance company has directions of 1/2 of a 0.125mg  tablet. Pt states for as long as he can remember he has taken 1 of a 0.125mg  tablet, he does not remember taking 1/2 of a 0.125mg  tablet.   Pt advised I will forward to Dr Irish Lack to confirm pt should continue 1 of a 0.125mg  tablet daily, his current dose.

## 2015-06-12 NOTE — Telephone Encounter (Signed)
New problem    Pt need to speak to you concerning Lanoxin that he take. Please call pt.

## 2015-06-13 NOTE — Telephone Encounter (Signed)
Ok to take 0.125 mg daily  He should have  A digoxin level and BMet done in a few weeks.

## 2015-06-14 NOTE — Telephone Encounter (Signed)
No answer and no way to leave a message. Will continue to call. 

## 2015-06-14 NOTE — Telephone Encounter (Signed)
**Note De-Identified Buffie Herne Obfuscation** No answer/no way to leave message. Will continue to call.

## 2015-06-15 NOTE — Telephone Encounter (Signed)
COntinue potassium.  Can check BMet with upcoming Dig level

## 2015-06-15 NOTE — Telephone Encounter (Signed)
Pt advised to continue 0.125mg  daily, pt advised  BMET/Digoxin scheduled for 06/28/15.

## 2015-06-16 ENCOUNTER — Telehealth: Payer: Self-pay | Admitting: *Deleted

## 2015-06-16 ENCOUNTER — Other Ambulatory Visit: Payer: Self-pay | Admitting: Interventional Cardiology

## 2015-06-16 ENCOUNTER — Other Ambulatory Visit: Payer: Self-pay | Admitting: *Deleted

## 2015-06-16 MED ORDER — POTASSIUM CHLORIDE ER 10 MEQ PO TBCR: 20.0000 meq | EXTENDED_RELEASE_TABLET | Freq: Every day | ORAL | Status: DC

## 2015-06-16 NOTE — Telephone Encounter (Signed)
Spoke with patient and made him aware that per Dr Irish Lack, he is to continue taking the potassium. The rx request came from [facility], so that is where I sent his rx but the patient stated that since they are overpriced, he would like the rx to be sent to [facility]. I will resend rx, which will be for a thirty day supply as he has an upcoming lab appt. Patient is aware that we will send in a long term rx once his labs have been resulted.

## 2015-06-21 ENCOUNTER — Other Ambulatory Visit (INDEPENDENT_AMBULATORY_CARE_PROVIDER_SITE_OTHER): Payer: Medicare HMO | Admitting: *Deleted

## 2015-06-21 DIAGNOSIS — I480 Paroxysmal atrial fibrillation: Secondary | ICD-10-CM | POA: Diagnosis not present

## 2015-06-21 DIAGNOSIS — I5022 Chronic systolic (congestive) heart failure: Secondary | ICD-10-CM | POA: Diagnosis not present

## 2015-06-21 DIAGNOSIS — I2510 Atherosclerotic heart disease of native coronary artery without angina pectoris: Secondary | ICD-10-CM

## 2015-06-21 LAB — BASIC METABOLIC PANEL
BUN: 29 mg/dL — AB (ref 7–25)
CHLORIDE: 105 mmol/L (ref 98–110)
CO2: 29 mmol/L (ref 20–31)
CREATININE: 1.73 mg/dL — AB (ref 0.70–1.18)
Calcium: 9 mg/dL (ref 8.6–10.3)
Glucose, Bld: 93 mg/dL (ref 65–99)
Potassium: 4.8 mmol/L (ref 3.5–5.3)
Sodium: 142 mmol/L (ref 135–146)

## 2015-06-21 NOTE — Addendum Note (Signed)
Addended by: Eulis Foster on: 06/21/2015 11:12 AM   Modules accepted: Orders

## 2015-06-22 LAB — DIGOXIN LEVEL: Digoxin Level: 1.7 ug/L (ref 0.8–2.0)

## 2015-06-23 ENCOUNTER — Telehealth: Payer: Self-pay | Admitting: *Deleted

## 2015-06-23 MED ORDER — DIGOXIN 125 MCG PO TABS: 0.0625 mg | ORAL_TABLET | Freq: Every day | ORAL | Status: DC

## 2015-06-23 NOTE — Telephone Encounter (Signed)
Spoke with pt and informed him of new orders to take Digoxin 0.0625mg  QD. Pt verbalized understanding and was in agreement with this plan.

## 2015-06-23 NOTE — Telephone Encounter (Signed)
-----   Message from Jettie Booze, MD sent at 06/23/2015  3:29 PM EDT ----- Would decrease to 0.0625 tab daily of digoxin, or if that tab is not available, could change to 0.125 mg every other day.

## 2015-06-27 ENCOUNTER — Other Ambulatory Visit: Payer: Self-pay | Admitting: Interventional Cardiology

## 2015-06-28 ENCOUNTER — Other Ambulatory Visit: Payer: Medicare HMO

## 2015-07-13 ENCOUNTER — Other Ambulatory Visit: Payer: Self-pay | Admitting: Interventional Cardiology

## 2015-08-09 ENCOUNTER — Telehealth: Payer: Self-pay | Admitting: Interventional Cardiology

## 2015-08-09 NOTE — Telephone Encounter (Signed)
No answer and no way to leave a message. I did not call the pt but he is scheduled in [facility] tomorrow at 1:30. Call may have been from [place] office or our [facility] call reminding the pt of his appt tomorrow.

## 2015-08-09 NOTE — Telephone Encounter (Signed)
**Note De-Identified Troy [name] Obfuscation** The pt is advised that I did not call him and that after reviewing his chart I dont see any documentation that someone called him from this office. I reminded him of his future appts with our office and advised him that he may have missed his [facility] call reminding him of his appt tomorrow at our [place] office. He verbalized understanding and thanked me for calling him back.

## 2015-08-09 NOTE — Telephone Encounter (Signed)
Follow-up     The pt states some-one called him did not leave a message

## 2015-08-10 ENCOUNTER — Encounter: Payer: Self-pay | Admitting: Internal Medicine

## 2015-08-10 ENCOUNTER — Ambulatory Visit (INDEPENDENT_AMBULATORY_CARE_PROVIDER_SITE_OTHER): Payer: Medicare HMO | Admitting: *Deleted

## 2015-08-10 DIAGNOSIS — I480 Paroxysmal atrial fibrillation: Secondary | ICD-10-CM

## 2015-08-10 DIAGNOSIS — I493 Ventricular premature depolarization: Secondary | ICD-10-CM

## 2015-08-10 DIAGNOSIS — I5022 Chronic systolic (congestive) heart failure: Secondary | ICD-10-CM

## 2015-08-10 LAB — CUP PACEART INCLINIC DEVICE CHECK
Brady Statistic RA Percent Paced: 66 %
Brady Statistic RV Percent Paced: 94 %
HighPow Impedance: 45.205
Implantable Lead Implant Date: 20120801
Implantable Lead Implant Date: 20120801
Implantable Lead Location: 753859
Implantable Lead Location: 753860
Lead Channel Impedance Value: 475 Ohm
Lead Channel Impedance Value: 950 Ohm
Lead Channel Pacing Threshold Amplitude: 0.5 V
Lead Channel Pacing Threshold Amplitude: 0.75 V
Lead Channel Pacing Threshold Amplitude: 1.25 V
Lead Channel Pacing Threshold Pulse Width: 0.5 ms
Lead Channel Pacing Threshold Pulse Width: 0.5 ms
Lead Channel Pacing Threshold Pulse Width: 0.5 ms
Lead Channel Pacing Threshold Pulse Width: 0.5 ms
Lead Channel Setting Pacing Amplitude: 1.625
Lead Channel Setting Pacing Amplitude: 2.5 V
Lead Channel Setting Pacing Pulse Width: 0.5 ms
MDC IDC LEAD IMPLANT DT: 20120801
MDC IDC LEAD LOCATION: 753858
MDC IDC MSMT BATTERY REMAINING LONGEVITY: 37.2
MDC IDC MSMT LEADCHNL LV PACING THRESHOLD AMPLITUDE: 1.25 V
MDC IDC MSMT LEADCHNL LV PACING THRESHOLD PULSEWIDTH: 0.5 ms
MDC IDC MSMT LEADCHNL RA IMPEDANCE VALUE: 437.5 Ohm
MDC IDC MSMT LEADCHNL RA PACING THRESHOLD AMPLITUDE: 0.75 V
MDC IDC MSMT LEADCHNL RA SENSING INTR AMPL: 4.4 mV
MDC IDC MSMT LEADCHNL RV PACING THRESHOLD AMPLITUDE: 0.5 V
MDC IDC MSMT LEADCHNL RV PACING THRESHOLD PULSEWIDTH: 0.5 ms
MDC IDC MSMT LEADCHNL RV SENSING INTR AMPL: 12 mV
MDC IDC SESS DTM: 20170720154502
MDC IDC SET LEADCHNL LV PACING PULSEWIDTH: 0.5 ms
MDC IDC SET LEADCHNL RV PACING AMPLITUDE: 2 V
MDC IDC SET LEADCHNL RV SENSING SENSITIVITY: 0.5 mV
Pulse Gen Serial Number: 7001284

## 2015-08-10 NOTE — Progress Notes (Signed)
CRT-D device check in office. Thresholds and sensing consistent with previous device measurements. Lead impedance trends stable over time. 3.8% AT/AF burden--max dur. 14hrs, Max A 640 + warfarin. No ventricular arrhythmia episodes recorded. Patient bi-ventricularly pacing 94% of the time. Device programmed with appropriate safety margins. Heart failure diagnostics reviewed and trends are stable for patient. Vibratory alerts demonstrated for patient. No changes made this session. Estimated longevity 3.1 years. Patient will follow up with [facility] in 3 months. Patient education completed including shock plan.

## 2015-08-14 ENCOUNTER — Ambulatory Visit (INDEPENDENT_AMBULATORY_CARE_PROVIDER_SITE_OTHER): Payer: Medicare HMO | Admitting: Pharmacist

## 2015-08-14 ENCOUNTER — Other Ambulatory Visit: Payer: Self-pay | Admitting: *Deleted

## 2015-08-14 ENCOUNTER — Telehealth: Payer: Self-pay | Admitting: Interventional Cardiology

## 2015-08-14 DIAGNOSIS — I480 Paroxysmal atrial fibrillation: Secondary | ICD-10-CM

## 2015-08-14 DIAGNOSIS — I82409 Acute embolism and thrombosis of unspecified deep veins of unspecified lower extremity: Secondary | ICD-10-CM | POA: Diagnosis not present

## 2015-08-14 DIAGNOSIS — Z5181 Encounter for therapeutic drug level monitoring: Secondary | ICD-10-CM

## 2015-08-14 LAB — POCT INR: INR: 2.2

## 2015-08-14 MED ORDER — POTASSIUM CHLORIDE ER 10 MEQ PO TBCR
20.0000 meq | EXTENDED_RELEASE_TABLET | Freq: Every day | ORAL | 1 refills | Status: DC
Start: 2015-08-14 — End: 2015-09-13

## 2015-08-14 NOTE — Telephone Encounter (Signed)
NEW MESSAGE   *STAT* If patient is at the pharmacy, call can be transferred to refill team.   1. Which medications need to be refilled? (please list name of each medication and dose if known) POTASSIUM CL MICRO 10 MEQ ER TABS  2. Which pharmacy/location (including street and city if local pharmacy) is medication to be sent to? [facility]  3. Do they need a 30 day or 90 day supply? 30DAY

## 2015-08-16 IMAGING — CR DG CHEST 1V PORT
1 series · 1 of 1 positions shown · non-contrast
Comparison: 11/07/2013.

CLINICAL DATA: Shortness of breath.

EXAM:
PORTABLE CHEST - 1 VIEW

[AP]
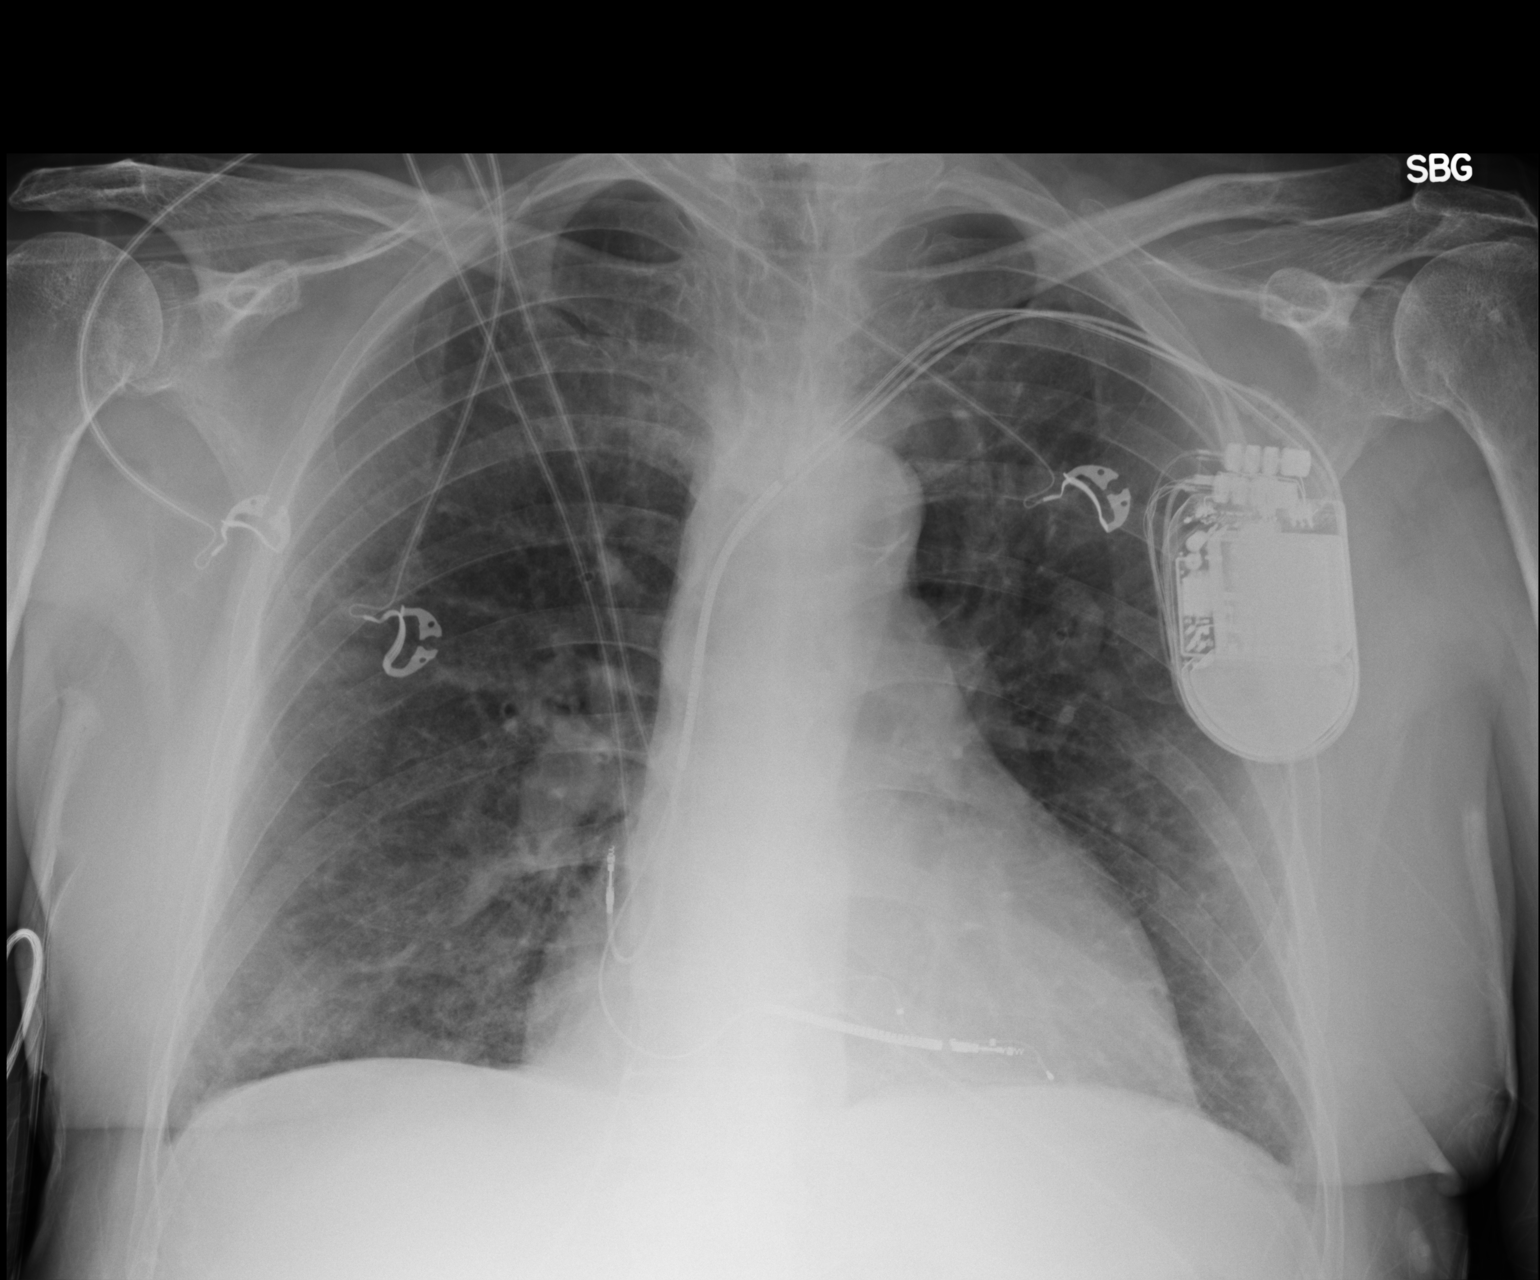

[1 of 1 positions shown; findings below may reference images not displayed]

FINDINGS: Mediastinum and hilar structures are normal. Cardiomegaly. Mild
pulmonary vascular prominence. Mild interstitial prominence.
Partially cleared from prior exam. Left pleural effusion has
cleared. Cardiac pacer and lead tips in right atrium right
ventricle. No pneumothorax. No acute bony abnormality.
IMPRESSION: 1. Mild congestive heart failure. Mild interstitial edema has
partially cleared from prior exam. Left pleural effusion has
cleared.
2. Cardiac pacer with lead tips in right atrium right ventricle.

## 2015-08-17 ENCOUNTER — Telehealth: Payer: Self-pay | Admitting: Interventional Cardiology

## 2015-08-17 MED ORDER — FUROSEMIDE 40 MG PO TABS: 80.0000 mg | ORAL_TABLET | Freq: Every day | ORAL | 1 refills | Status: DC

## 2015-08-17 NOTE — Telephone Encounter (Signed)
**Note De-Identified Troy [name] Obfuscation** Pt is requesting a refill on his Furosemide. Per his request I have sent his refill to [facility] in [place] to fill.

## 2015-08-17 NOTE — Telephone Encounter (Signed)
New MEssage  Pt calling to speak w/ RN about medications- would not specify which med. Please call back and discuss.

## 2015-08-24 ENCOUNTER — Inpatient Hospital Stay (HOSPITAL_COMMUNITY)
Admission: EM | Admit: 2015-08-24 | Discharge: 2015-08-27 | DRG: 190 | Disposition: A | Discharge status: Home Health | Payer: Medicare HMO | Attending: Family Medicine | Admitting: Family Medicine

## 2015-08-24 ENCOUNTER — Emergency Department (HOSPITAL_COMMUNITY): Payer: Medicare HMO

## 2015-08-24 ENCOUNTER — Encounter (HOSPITAL_COMMUNITY): Payer: Self-pay | Admitting: Emergency Medicine

## 2015-08-24 DIAGNOSIS — Z888 Allergy status to other drugs, medicaments and biological substances status: Secondary | ICD-10-CM

## 2015-08-24 DIAGNOSIS — J309 Allergic rhinitis, unspecified: Secondary | ICD-10-CM | POA: Diagnosis present

## 2015-08-24 DIAGNOSIS — Z955 Presence of coronary angioplasty implant and graft: Secondary | ICD-10-CM

## 2015-08-24 DIAGNOSIS — I2510 Atherosclerotic heart disease of native coronary artery without angina pectoris: Secondary | ICD-10-CM | POA: Diagnosis present

## 2015-08-24 DIAGNOSIS — J440 Chronic obstructive pulmonary disease with acute lower respiratory infection: Principal | ICD-10-CM | POA: Diagnosis present

## 2015-08-24 DIAGNOSIS — Z79899 Other long term (current) drug therapy: Secondary | ICD-10-CM

## 2015-08-24 DIAGNOSIS — Z7951 Long term (current) use of inhaled steroids: Secondary | ICD-10-CM | POA: Diagnosis not present

## 2015-08-24 DIAGNOSIS — Z87891 Personal history of nicotine dependence: Secondary | ICD-10-CM | POA: Diagnosis not present

## 2015-08-24 DIAGNOSIS — I130 Hypertensive heart and chronic kidney disease with heart failure and stage 1 through stage 4 chronic kidney disease, or unspecified chronic kidney disease: Secondary | ICD-10-CM | POA: Diagnosis present

## 2015-08-24 DIAGNOSIS — I255 Ischemic cardiomyopathy: Secondary | ICD-10-CM | POA: Diagnosis present

## 2015-08-24 DIAGNOSIS — N183 Chronic kidney disease, stage 3 unspecified: Secondary | ICD-10-CM | POA: Diagnosis present

## 2015-08-24 DIAGNOSIS — J9611 Chronic respiratory failure with hypoxia: Secondary | ICD-10-CM | POA: Diagnosis present

## 2015-08-24 DIAGNOSIS — J189 Pneumonia, unspecified organism: Secondary | ICD-10-CM | POA: Diagnosis present

## 2015-08-24 DIAGNOSIS — I480 Paroxysmal atrial fibrillation: Secondary | ICD-10-CM | POA: Diagnosis present

## 2015-08-24 DIAGNOSIS — Z800 Family history of malignant neoplasm of digestive organs: Secondary | ICD-10-CM

## 2015-08-24 DIAGNOSIS — K219 Gastro-esophageal reflux disease without esophagitis: Secondary | ICD-10-CM | POA: Diagnosis present

## 2015-08-24 DIAGNOSIS — M1990 Unspecified osteoarthritis, unspecified site: Secondary | ICD-10-CM | POA: Diagnosis present

## 2015-08-24 DIAGNOSIS — Z95810 Presence of automatic (implantable) cardiac defibrillator: Secondary | ICD-10-CM

## 2015-08-24 DIAGNOSIS — R791 Abnormal coagulation profile: Secondary | ICD-10-CM | POA: Diagnosis present

## 2015-08-24 DIAGNOSIS — I5022 Chronic systolic (congestive) heart failure: Secondary | ICD-10-CM | POA: Diagnosis present

## 2015-08-24 DIAGNOSIS — M109 Gout, unspecified: Secondary | ICD-10-CM | POA: Diagnosis present

## 2015-08-24 DIAGNOSIS — Z8249 Family history of ischemic heart disease and other diseases of the circulatory system: Secondary | ICD-10-CM

## 2015-08-24 DIAGNOSIS — Z9981 Dependence on supplemental oxygen: Secondary | ICD-10-CM

## 2015-08-24 DIAGNOSIS — I420 Dilated cardiomyopathy: Secondary | ICD-10-CM | POA: Diagnosis present

## 2015-08-24 DIAGNOSIS — N400 Enlarged prostate without lower urinary tract symptoms: Secondary | ICD-10-CM | POA: Diagnosis present

## 2015-08-24 DIAGNOSIS — Z86718 Personal history of other venous thrombosis and embolism: Secondary | ICD-10-CM | POA: Diagnosis not present

## 2015-08-24 DIAGNOSIS — Z7901 Long term (current) use of anticoagulants: Secondary | ICD-10-CM

## 2015-08-24 DIAGNOSIS — R0602 Shortness of breath: Secondary | ICD-10-CM | POA: Diagnosis present

## 2015-08-24 DIAGNOSIS — J441 Chronic obstructive pulmonary disease with (acute) exacerbation: Secondary | ICD-10-CM | POA: Diagnosis present

## 2015-08-24 DIAGNOSIS — E1122 Type 2 diabetes mellitus with diabetic chronic kidney disease: Secondary | ICD-10-CM | POA: Diagnosis present

## 2015-08-24 DIAGNOSIS — Z823 Family history of stroke: Secondary | ICD-10-CM

## 2015-08-24 DIAGNOSIS — Z87442 Personal history of urinary calculi: Secondary | ICD-10-CM

## 2015-08-24 HISTORY — DX: Pneumonia, unspecified organism: J18.9

## 2015-08-24 LAB — BASIC METABOLIC PANEL
ANION GAP: 7 (ref 5–15)
BUN: 36 mg/dL — ABNORMAL HIGH (ref 6–20)
CALCIUM: 9.1 mg/dL (ref 8.9–10.3)
CO2: 30 mmol/L (ref 22–32)
Chloride: 101 mmol/L (ref 101–111)
Creatinine, Ser: 1.79 mg/dL — ABNORMAL HIGH (ref 0.61–1.24)
GFR, EST AFRICAN AMERICAN: 41 mL/min — AB (ref >60)
GFR, EST NON AFRICAN AMERICAN: 35 mL/min — AB (ref >60)
Glucose, Bld: 121 mg/dL — ABNORMAL HIGH (ref 65–99)
Potassium: 4.4 mmol/L (ref 3.5–5.1)
SODIUM: 138 mmol/L (ref 135–145)

## 2015-08-24 LAB — PROTIME-INR
INR: 3
PROTHROMBIN TIME: 31.8 s — AB (ref 11.4–15.2)

## 2015-08-24 LAB — CBC WITH DIFFERENTIAL/PLATELET
BASOS PCT: 0 %
Basophils Absolute: 0 10*3/uL (ref 0.0–0.1)
EOS ABS: 0.1 10*3/uL (ref 0.0–0.7)
EOS PCT: 0 %
HCT: 40.4 % (ref 39.0–52.0)
Hemoglobin: 13.5 g/dL (ref 13.0–17.0)
LYMPHS ABS: 1.3 10*3/uL (ref 0.7–4.0)
Lymphocytes Relative: 9 %
MCH: 33.9 pg (ref 26.0–34.0)
MCHC: 33.4 g/dL (ref 30.0–36.0)
MCV: 101.5 fL — ABNORMAL HIGH (ref 78.0–100.0)
MONO ABS: 1.6 10*3/uL — AB (ref 0.1–1.0)
MONOS PCT: 11 %
Neutro Abs: 11.9 10*3/uL — ABNORMAL HIGH (ref 1.7–7.7)
Neutrophils Relative %: 80 %
Platelets: 123 10*3/uL — ABNORMAL LOW (ref 150–400)
RBC: 3.98 MIL/uL — ABNORMAL LOW (ref 4.22–5.81)
RDW: 14.1 % (ref 11.5–15.5)
WBC: 14.8 10*3/uL — ABNORMAL HIGH (ref 4.0–10.5)

## 2015-08-24 LAB — DIGOXIN LEVEL: DIGOXIN LVL: 0.5 ng/mL — AB (ref 0.8–2.0)

## 2015-08-24 LAB — STREP PNEUMONIAE URINARY ANTIGEN: STREP PNEUMO URINARY ANTIGEN: NEGATIVE

## 2015-08-24 MED ORDER — METHYLPREDNISOLONE SODIUM SUCC 125 MG IJ SOLR
125.0000 mg | Freq: Once | INTRAMUSCULAR | Status: AC
  Administered 2015-08-24: 125 mg via INTRAVENOUS
  Filled 2015-08-24: qty 2

## 2015-08-24 MED ORDER — CEFTRIAXONE SODIUM 1 G IJ SOLR
1.0000 g | INTRAMUSCULAR | Status: DC
  Administered 2015-08-25 – 2015-08-26 (×2): 1 g via INTRAVENOUS
  Filled 2015-08-24 (×2): qty 10

## 2015-08-24 MED ORDER — PANTOPRAZOLE SODIUM 40 MG PO TBEC
40.0000 mg | DELAYED_RELEASE_TABLET | Freq: Every day | ORAL | Status: DC
  Administered 2015-08-24 – 2015-08-27 (×4): 40 mg via ORAL
  Filled 2015-08-24 (×4): qty 1

## 2015-08-24 MED ORDER — HYDRALAZINE HCL 25 MG PO TABS
25.0000 mg | ORAL_TABLET | Freq: Three times a day (TID) | ORAL | Status: DC
  Administered 2015-08-24 – 2015-08-27 (×9): 25 mg via ORAL
  Filled 2015-08-24 (×9): qty 1

## 2015-08-24 MED ORDER — ATORVASTATIN CALCIUM 10 MG PO TABS
10.0000 mg | ORAL_TABLET | Freq: Every day | ORAL | Status: DC
  Administered 2015-08-24 – 2015-08-26 (×3): 10 mg via ORAL
  Filled 2015-08-24 (×3): qty 1

## 2015-08-24 MED ORDER — GUAIFENESIN ER 600 MG PO TB12
600.0000 mg | ORAL_TABLET | Freq: Two times a day (BID) | ORAL | Status: DC
  Administered 2015-08-24 – 2015-08-27 (×6): 600 mg via ORAL
  Filled 2015-08-24 (×6): qty 1

## 2015-08-24 MED ORDER — IPRATROPIUM-ALBUTEROL 0.5-2.5 (3) MG/3ML IN SOLN
3.0000 mL | Freq: Four times a day (QID) | RESPIRATORY_TRACT | Status: DC
  Administered 2015-08-25 – 2015-08-27 (×10): 3 mL via RESPIRATORY_TRACT
  Filled 2015-08-24 (×10): qty 3

## 2015-08-24 MED ORDER — FUROSEMIDE 40 MG PO TABS
40.0000 mg | ORAL_TABLET | Freq: Two times a day (BID) | ORAL | Status: DC
  Administered 2015-08-24 – 2015-08-27 (×6): 40 mg via ORAL
  Filled 2015-08-24 (×6): qty 1

## 2015-08-24 MED ORDER — IPRATROPIUM-ALBUTEROL 0.5-2.5 (3) MG/3ML IN SOLN
3.0000 mL | Freq: Four times a day (QID) | RESPIRATORY_TRACT | Status: DC
  Administered 2015-08-24: 3 mL via RESPIRATORY_TRACT
  Filled 2015-08-24: qty 3

## 2015-08-24 MED ORDER — CETYLPYRIDINIUM CHLORIDE 0.05 % MT LIQD
7.0000 mL | Freq: Two times a day (BID) | OROMUCOSAL | Status: DC
  Administered 2015-08-25 – 2015-08-26 (×4): 7 mL via OROMUCOSAL

## 2015-08-24 MED ORDER — WARFARIN - PHARMACIST DOSING INPATIENT: Freq: Every day | Status: DC

## 2015-08-24 MED ORDER — LORATADINE 10 MG PO TABS
10.0000 mg | ORAL_TABLET | Freq: Every day | ORAL | Status: DC
  Administered 2015-08-25 – 2015-08-26 (×2): 10 mg via ORAL
  Filled 2015-08-24 (×2): qty 1

## 2015-08-24 MED ORDER — ALLOPURINOL 100 MG PO TABS
100.0000 mg | ORAL_TABLET | Freq: Every day | ORAL | Status: DC
  Administered 2015-08-25 – 2015-08-27 (×3): 100 mg via ORAL
  Filled 2015-08-24 (×3): qty 1

## 2015-08-24 MED ORDER — AZITHROMYCIN 250 MG PO TABS
500.0000 mg | ORAL_TABLET | ORAL | Status: DC
  Administered 2015-08-25 – 2015-08-26 (×2): 500 mg via ORAL
  Filled 2015-08-24 (×2): qty 2

## 2015-08-24 MED ORDER — DIGOXIN 0.0625 MG HALF TABLET
0.0625 mg | ORAL_TABLET | Freq: Every day | ORAL | Status: DC
  Administered 2015-08-25 – 2015-08-27 (×3): 0.0625 mg via ORAL
  Filled 2015-08-24 (×3): qty 1

## 2015-08-24 MED ORDER — SODIUM CHLORIDE 0.9 % IV SOLN: 250.0000 mL | INTRAVENOUS | Status: DC | PRN

## 2015-08-24 MED ORDER — SODIUM CHLORIDE 0.9% FLUSH
3.0000 mL | Freq: Two times a day (BID) | INTRAVENOUS | Status: DC
  Administered 2015-08-24 – 2015-08-27 (×6): 3 mL via INTRAVENOUS

## 2015-08-24 MED ORDER — SODIUM CHLORIDE 0.9% FLUSH: 3.0000 mL | INTRAVENOUS | Status: DC | PRN

## 2015-08-24 MED ORDER — CHLORHEXIDINE GLUCONATE 0.12 % MT SOLN
15.0000 mL | Freq: Two times a day (BID) | OROMUCOSAL | Status: DC
  Administered 2015-08-24 – 2015-08-27 (×5): 15 mL via OROMUCOSAL
  Filled 2015-08-24 (×6): qty 15

## 2015-08-24 MED ORDER — LEVOFLOXACIN IN D5W 750 MG/150ML IV SOLN
750.0000 mg | Freq: Once | INTRAVENOUS | Status: AC
  Administered 2015-08-24: 750 mg via INTRAVENOUS
  Filled 2015-08-24: qty 150

## 2015-08-24 MED ORDER — POTASSIUM CHLORIDE ER 10 MEQ PO TBCR
20.0000 meq | EXTENDED_RELEASE_TABLET | Freq: Every day | ORAL | Status: DC
  Administered 2015-08-25 – 2015-08-27 (×3): 20 meq via ORAL
  Filled 2015-08-24 (×6): qty 2

## 2015-08-24 MED ORDER — ALFUZOSIN HCL ER 10 MG PO TB24
10.0000 mg | ORAL_TABLET | Freq: Every day | ORAL | Status: DC
  Administered 2015-08-25 – 2015-08-27 (×3): 10 mg via ORAL
  Filled 2015-08-24 (×3): qty 1

## 2015-08-24 MED ORDER — FLUTICASONE PROPIONATE 50 MCG/ACT NA SUSP
2.0000 | Freq: Every day | NASAL | Status: DC | PRN
  Administered 2015-08-25: 2 via NASAL
  Filled 2015-08-24: qty 16

## 2015-08-24 MED ORDER — METHYLPREDNISOLONE SODIUM SUCC 125 MG IJ SOLR
60.0000 mg | Freq: Three times a day (TID) | INTRAMUSCULAR | Status: DC
  Administered 2015-08-24 – 2015-08-26 (×6): 60 mg via INTRAVENOUS
  Filled 2015-08-24 (×6): qty 2

## 2015-08-24 MED ORDER — IPRATROPIUM-ALBUTEROL 0.5-2.5 (3) MG/3ML IN SOLN
3.0000 mL | Freq: Once | RESPIRATORY_TRACT | Status: AC
Start: 2015-08-24 — End: 2015-08-24
  Administered 2015-08-24: 3 mL via RESPIRATORY_TRACT
  Filled 2015-08-24: qty 3

## 2015-08-24 MED ORDER — ALBUTEROL SULFATE (2.5 MG/3ML) 0.083% IN NEBU
2.5000 mg | INHALATION_SOLUTION | RESPIRATORY_TRACT | Status: DC | PRN
  Administered 2015-08-25: 2.5 mg via RESPIRATORY_TRACT
  Filled 2015-08-24: qty 3

## 2015-08-24 MED ORDER — LOSARTAN POTASSIUM 50 MG PO TABS
50.0000 mg | ORAL_TABLET | Freq: Every day | ORAL | Status: DC
  Administered 2015-08-24 – 2015-08-27 (×4): 50 mg via ORAL
  Filled 2015-08-24 (×4): qty 1

## 2015-08-24 MED ORDER — CARVEDILOL 12.5 MG PO TABS
12.5000 mg | ORAL_TABLET | Freq: Two times a day (BID) | ORAL | Status: DC
  Administered 2015-08-24 – 2015-08-27 (×6): 12.5 mg via ORAL
  Filled 2015-08-24 (×6): qty 1

## 2015-08-24 MED ORDER — IPRATROPIUM-ALBUTEROL 0.5-2.5 (3) MG/3ML IN SOLN
3.0000 mL | Freq: Once | RESPIRATORY_TRACT | Status: AC
  Administered 2015-08-24: 3 mL via RESPIRATORY_TRACT
  Filled 2015-08-24: qty 3

## 2015-08-24 MED ORDER — WARFARIN SODIUM 2 MG PO TABS
2.0000 mg | ORAL_TABLET | Freq: Once | ORAL | Status: AC
  Administered 2015-08-24: 2 mg via ORAL
  Filled 2015-08-24: qty 1

## 2015-08-24 NOTE — ED Notes (Signed)
Patient transported to X-ray 

## 2015-08-24 NOTE — ED Provider Notes (Signed)
[facility] DEPT Provider Note   CSN: [id number] Arrival date & time: 08/24/15  1159  First Provider Contact:  First MD Initiated Contact with Patient 08/24/15 1246        History   Chief Complaint Chief Complaint  Patient presents with  . Shortness of Breath    HPI Troy [name] is a 75 y.o. male with a history of COPD and CHF presenting with shortness of breath. Started about 2 days ago. Has been having cough and shortness of breath. He felt severe chills and the first day of illness but no fever since. His doctor, Dr. Jonelle Sidle, prescribed azithromycin and he has taken 2 doses, has not taken today's dose yet. Not currently on steroids. Patient has been taking his albuterol with no relief. He thinks this is not similar [place] when he was diagnosed with a PE. He is currently on warfarin. Denies any chest pain or leg swelling. He's not sure if this is similar to prior bronchitis. He was noted to be mildly hypoxic at 87%, states he wears oxygen intermittently during the day and mostly at night.  HPI  Past Medical History:  Diagnosis Date  . Atrial fibrillation ([place])   . CAD (coronary artery disease) 1996   status post PCI of the RCA   . Congestive heart failure, unspecified   . COPD (chronic obstructive pulmonary disease) (HCC)    Pt on home O2 at night and PRN, unsure of date of diagnosis.  . Diabetes mellitus, type 2 ([place])   . DJD (degenerative joint disease)   . GERD (gastroesophageal reflux disease)   . Gout   . HTN (hypertension)   . Ischemic dilated cardiomyopathy   . Nephrolithiasis   . Other primary cardiomyopathies   . Personal history of DVT (deep vein thrombosis)   . Prostatitis     Patient Active Problem List   Diagnosis Date Noted  . Acute exacerbation of chronic obstructive pulmonary disease (COPD) ([place]) 03/05/2015  . Acute renal failure superimposed on stage 3 chronic kidney disease ([place]) 03/05/2015  . COPD with acute exacerbation ([place]) 03/05/2015  .  Renal failure (ARF), acute on chronic (HCC)   . Stomach ache 01/18/2015  . COPD exacerbation ([place])   . AKI (acute kidney injury) ([place]) 12/26/2014  . BPH (benign prostatic hyperplasia) 12/25/2014  . FTT (failure to thrive) in adult 12/25/2014  . Encounter for therapeutic drug monitoring 08/05/2014  . Pulmonary nodules 12/23/2013  . Hemoptysis   . Dyspnea   . Acute on chronic systolic CHF (congestive heart failure) ([place])   . Chronic kidney disease, stage 3   . Coronary artery disease involving native coronary artery of native heart without angina pectoris   . Paroxysmal atrial fibrillation (HCC)   . Frequent PVCs   . Hypoxia   . Acute on chronic respiratory failure with hypoxia ([place])   . Chronic obstructive pulmonary disease with acute exacerbation ([place])   . COPD (chronic obstructive pulmonary disease) ([place]) 06/16/2013  . Lipoma of neck 09/11/2011  . 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 Coronary artery disease 01/30/2011  . Atrial fibrillation ([place]) 01/30/2011  . Chronic systolic CHF (congestive heart failure) ([place])   . Other primary cardiomyopathies   . Diverticulosis of colon with hemorrhage 08/28/2010    Past Surgical History:  Procedure Laterality Date  . BACK SURGERY    . CARDIAC DEFIBRILLATOR PLACEMENT    . CORONARY STENT PLACEMENT    . PACEMAKER INSERTION  Home Medications    Prior to Admission medications   Medication Sig Start Date End Date Taking? Authorizing Provider  acetaminophen (TYLENOL) 500 MG tablet Take 500 mg by mouth every 6 (six) hours as needed for mild pain or moderate pain.     Historical Provider, MD  alfuzosin (UROXATRAL) 10 MG 24 hr tablet Take 10 mg by mouth daily with breakfast.     Historical Provider, MD  allopurinol (ZYLOPRIM) 100 MG tablet Take 100 mg by mouth daily.    Historical Provider, MD  atorvastatin (LIPITOR) 10 MG tablet Take 1 tablet (10 mg total) by mouth at bedtime. 08/03/14   Jettie Booze, MD  carvedilol (COREG) 12.5 MG tablet Take 1 tablet (12.5 mg total) by mouth 2 (two) times daily with a meal. 03/03/15   Jettie Booze, MD  digoxin (LANOXIN) 0.125 MG tablet Take 0.5 tablets (0.0625 mg total) by mouth daily. 06/23/15   Jettie Booze, MD  fluticasone (FLONASE) 50 MCG/ACT nasal spray Place 2 sprays into both nostrils daily as needed for allergies.  04/07/13   Historical Provider, MD  furosemide (LASIX) 40 MG tablet Take 2 tablets (80 mg total) by mouth daily. with food 08/17/15   Jettie Booze, MD  guaiFENesin (ROBITUSSIN) 100 MG/5ML SOLN Take 5 mLs (100 mg total) by mouth every 6 (six) hours as needed for cough or to loosen phlegm. 03/09/15   Robbie Lis, MD  hydrALAZINE (APRESOLINE) 25 MG tablet Take 1 tablet (25 mg total) by mouth 3 (three) times daily. 05/31/15   Jettie Booze, MD  levalbuterol Penne Lash) 0.63 MG/3ML nebulizer solution Take 3 mLs (0.63 mg total) by nebulization every 6 (six) hours as needed for wheezing or shortness of breath. 03/09/15   Robbie Lis, MD  loratadine (CLARITIN) 10 MG tablet Take 10 mg by mouth daily. For allergies    Historical Provider, MD  mometasone-formoterol (DULERA) 100-5 MCG/ACT AERO Inhale 2 puffs into the lungs 2 (two) times daily. 11/26/13   Kinnie Feil, MD  omeprazole (PRILOSEC) 40 MG capsule Take 40 mg by mouth at bedtime.     Historical Provider, MD  potassium chloride (K-DUR) 10 MEQ tablet Take 2 tablets (20 mEq total) by mouth daily. 08/14/15   Jettie Booze, MD  PROAIR HFA 108 (90 BASE) MCG/ACT inhaler INHALE 2 PUFFS EVERY 4 HOURS AS NEEDED FOR WHEEZING OR SHORTNESS OF BREATH. 09/05/14   Collene Gobble, MD  senna-docusate (SENOKOT-S) 8.6-50 MG tablet Take 1 tablet by mouth at bedtime as needed for mild constipation. 03/09/15   Robbie Lis, MD  tamsulosin (FLOMAX) 0.4 MG CAPS capsule Take 0.4 mg by mouth.    Historical Provider, MD  warfarin (COUMADIN) 1 MG tablet Take 2 tabs daily except 3 tabs  on Wednesday & Saturday or as directed by [facility] 05/16/15   Jettie Booze, MD    Family History Family History  Problem Relation Age of Onset  . Heart disease Father   . Cancer Father     LUNG  . Colon cancer Mother   . Stroke Paternal Uncle   . Heart attack Neg Hx   . Hyperlipidemia Neg Hx   . Hypertension Neg Hx     Social History Social History  Substance Use Topics  . Smoking status: Former Smoker    Packs/day: 4.00    Years: 50.00    Types: Cigarettes    Quit date: 01/22/1980  . Smokeless tobacco: Never Used  .  Alcohol use No     Comment: denies     Allergies   Benadryl [diphenhydramine hcl]   Review of Systems Review of Systems  Constitutional: Positive for chills and fever.  Respiratory: Positive for cough and shortness of breath.   Cardiovascular: Negative for chest pain and leg swelling.  All other systems reviewed and are negative.    Physical Exam Updated Vital Signs BP 120/70 (BP Location: Left Arm)   Pulse 101   Temp 97.8 F (36.6 C) (Oral)   SpO2 95%   Physical Exam  Constitutional: He is oriented to person, place, and time. He appears well-developed and well-nourished. No distress.  HENT:  Head: Normocephalic and atraumatic.  Right Ear: External ear normal.  Left Ear: External ear normal.  Nose: Nose normal.  Eyes: Right eye exhibits no discharge. Left eye exhibits no discharge.  Neck: Neck supple.  Cardiovascular: Normal rate, regular rhythm, normal heart sounds and intact distal pulses.   Pulmonary/Chest: Effort normal. No accessory muscle usage. No respiratory distress. He has decreased breath sounds (diffuse).  Prolonged expiration Speaks in complete sentences  Abdominal: Soft. There is no tenderness.  Musculoskeletal: He exhibits no edema.  Neurological: He is alert and oriented to person, place, and time.  Skin: Skin is warm and dry. He is not diaphoretic.  Nursing note and vitals reviewed.    ED Treatments /  Results  Labs (all labs ordered are listed, but only abnormal results are displayed) Labs Reviewed  BASIC METABOLIC PANEL - Abnormal; Notable for the following:       Result Value   Glucose, Bld 121 (*)    BUN 36 (*)    Creatinine, Ser 1.79 (*)    GFR calc non Af Amer 35 (*)    GFR calc Af Amer 41 (*)    All other components within normal limits  CBC WITH DIFFERENTIAL/PLATELET - Abnormal; Notable for the following:    WBC 14.8 (*)    RBC 3.98 (*)    MCV 101.5 (*)    Platelets 123 (*)    Neutro Abs 11.9 (*)    Monocytes Absolute 1.6 (*)    All other components within normal limits  PROTIME-INR - Abnormal; Notable for the following:    Prothrombin Time 31.8 (*)    All other components within normal limits  CULTURE, BLOOD (ROUTINE X 2)  CULTURE, BLOOD (ROUTINE X 2)    EKG  EKG Interpretation  Date/Time:  Thursday August 24 2015 12:55:50 EDT Ventricular Rate:  91 PR Interval:    QRS Duration: 117 QT Interval:  417 QTC Calculation: 514 R Axis:   -137 Text Interpretation:  A-V dual-paced rhythm with some inhibition No further analysis attempted due to paced rhythm no significant change since Feb 2017 Confirmed by Regenia Skeeter MD, Deaisa Merida [phone]) on 08/24/2015 1:00:45 PM       Radiology Dg Chest 2 View  Result Date: 08/24/2015 CLINICAL DATA:  Short of breath to 3 days. EXAM: CHEST  2 VIEW COMPARISON:  Radiograph 03/06/2015 FINDINGS: LEFT-sided pacemaker with 2 continuous leads overlies normal cardiac silhouette. Lungs are hyperinflated. There is mild airspace disease in the RIGHT LEFT lower lobe. No pneumothorax. Small effusions. IMPRESSION: 1. Bibasilar airspace disease consistent with multifocal pneumonia versus pulmonary edema. Recommend clinical correlation. 2. Hyperinflated lungs. Electronically Signed   By: Suzy Bouchard M.D.   On: 08/24/2015 14:22    Procedures Procedures (including critical care time)  Medications Ordered in ED Medications  levofloxacin (LEVAQUIN) IVPB  750  mg (not administered)  ipratropium-albuterol (DUONEB) 0.5-2.5 (3) MG/3ML nebulizer solution 3 mL (3 mLs Nebulization Given 08/24/15 1308)  methylPREDNISolone sodium succinate (SOLU-MEDROL) 125 mg/2 mL injection 125 mg (125 mg Intravenous Given 08/24/15 1328)  ipratropium-albuterol (DUONEB) 0.5-2.5 (3) MG/3ML nebulizer solution 3 mL (3 mLs Nebulization Given 08/24/15 1528)     Initial Impression / Assessment and Plan / ED Course  I have reviewed the triage vital signs and the nursing notes.  Pertinent labs & imaging results that were available during my care of the patient were reviewed by me and considered in my medical decision making (see chart for details).  Clinical Course  Comment By Time  Exam shows tight lungs but no distress. Will try duoneb, IV solumedrol, get CXR and check labs including INR. I think this is probably all COPD, doubt ACS or PE. Sherwood Gambler, MD 08/03 1252  Patient's breathing is mildly improved. Chest is less tight and some more wheezing and a little better air movement. I think his Xray represents PNA rather than edema. Has been on azithro but not helping. At this point I think admission with IV antibiotics is warranted. Will consult hospitalist.  Sherwood Gambler, MD 08/03 1451  Dr. Sloan Leiter to admit. Inpatient tele Sherwood Gambler, MD 08/03 1517     Final Clinical Impressions(s) / ED Diagnoses   Final diagnoses:  CAP (community acquired pneumonia)    New Prescriptions New Prescriptions   No medications on file     Sherwood Gambler, MD 08/24/15 1541

## 2015-08-24 NOTE — Progress Notes (Signed)
Utilization Review completed.  Radley Barto RN CM  

## 2015-08-24 NOTE — ED Notes (Signed)
Unsuccessful IV attempt by this RN. Asked Morey Hummingbird RN to attempt and at bedside at this time.

## 2015-08-24 NOTE — ED Notes (Signed)
Hospitalist at bedside 

## 2015-08-24 NOTE — ED Notes (Signed)
RN aware of o2 sat

## 2015-08-24 NOTE — ED Notes (Signed)
Respiratory called for breathing treatment.

## 2015-08-24 NOTE — ED Notes (Signed)
RN at bedside collecting labs 

## 2015-08-24 NOTE — H&P (Signed)
HISTORY AND PHYSICAL       PATIENT DETAILS Name: Troy [name] Age: 75 y.o. Sex: male Date of Birth: 09-May-1940 Admit Date: 08/24/2015 SK:[id number], MD   Patient coming from: Home   CHIEF COMPLAINT:  Shortness of breath, cough-for the past 2-3 days.  HPI: Troy [name] is a 75 y.o. male with medical history significant of atrial fibrillation on Coumadin, permanent pacemaker/defibrillator implantation, chronic systolic heart failure with last EF around 20-25% in 2015 presents, chronic hypoxic respiratory failure on home O2, chronic kidney disease stage III to the hospital for evaluation of worsening shortness of breath and cough. Per patient for the past 2- 3 days he started developing shortness of breath that is very typical for his COPD flare. Shortness of breath has gradually worsened, and it has now been associated with a cough that is mostly dry. He claims that he did have subjective fever at home with chills and sweats. He has been taking his bronchodilators at home without any significant relief, as a result he presented to the ED for further evaluation and treatment.  Does acknowledge subjective fevers. No history of chest pain, nausea, vomiting or diarrhea. No history of abdominal pain.   ED Course: In the emergency room, patient was found to have bilateral pneumonia and chest x-ray, he was found to be wheezing, I was asked to admit this patient for further evaluation and treatment.   Lives at: Home Mobility: Cane/Walker Chronic Indwelling Foley:yes   REVIEW OF SYSTEMS:  Constitutional:   No  weight loss, night sweats,  Fevers, chills, fatigue.  HEENT:    No headaches, Dysphagia,Tooth/dental problems,Sore throat,  No sneezing, itching, ear ache, nasal congestion, post nasal drip  Cardio-vascular: No chest pain,Orthopnea, PND,lower extremity edema, anasarca, palpitations  GI:  No heartburn, indigestion, abdominal pain, nausea, vomiting,  diarrhea, melena or hematochezia  Resp: No  hemoptysis,plueritic chest pain.   Skin:  No rash or lesions.  GU:  No dysuria, change in color of urine, no urgency or frequency.  No flank pain.  Musculoskeletal: No joint pain or swelling.  No decreased range of motion.  No back pain.  Endocrine: No heat intolerance, no cold intolerance, no polyuria, no polydipsia  Psych: No change in mood or affect. No depression or anxiety.  No memory loss.   ALLERGIES:   Allergies  Allergen Reactions  . Benadryl [Diphenhydramine Hcl] Nausea And Vomiting    PAST MEDICAL HISTORY: Past Medical History:  Diagnosis Date  . Atrial fibrillation ([place])   . CAD (coronary artery disease) 1996   status post PCI of the RCA   . Congestive heart failure, unspecified   . COPD (chronic obstructive pulmonary disease) (HCC)    Pt on home O2 at night and PRN, unsure of date of diagnosis.  . Diabetes mellitus, type 2 ([place])   . DJD (degenerative joint disease)   . GERD (gastroesophageal reflux disease)   . Gout   . HTN (hypertension)   . Ischemic dilated cardiomyopathy   . Nephrolithiasis   . Other primary cardiomyopathies   . Personal history of DVT (deep vein thrombosis)   . Prostatitis     PAST SURGICAL HISTORY: Past Surgical History:  Procedure Laterality Date  . BACK SURGERY    . CARDIAC DEFIBRILLATOR PLACEMENT    . CORONARY STENT PLACEMENT    . PACEMAKER INSERTION      MEDICATIONS AT HOME: Prior to Admission medications   Medication Sig Start Date End Date  Taking? Authorizing Provider  albuterol (ACCUNEB) 1.25 MG/3ML nebulizer solution Take 3 mLs by nebulization 4 (four) times daily. 08/21/15  Yes Historical Provider, MD  alfuzosin (UROXATRAL) 10 MG 24 hr tablet Take 10 mg by mouth daily with breakfast.    Yes Historical Provider, MD  allopurinol (ZYLOPRIM) 100 MG tablet Take 100 mg by mouth daily.   Yes Historical Provider, MD  atorvastatin (LIPITOR) 10 MG tablet Take 1 tablet (10 mg  total) by mouth at bedtime. 08/03/14  Yes Jettie Booze, MD  carvedilol (COREG) 12.5 MG tablet Take 1 tablet (12.5 mg total) by mouth 2 (two) times daily with a meal. 03/03/15  Yes Jettie Booze, MD  digoxin (LANOXIN) 0.125 MG tablet Take 0.5 tablets (0.0625 mg total) by mouth daily. 06/23/15  Yes Jettie Booze, MD  fluticasone (FLONASE) 50 MCG/ACT nasal spray Place 2 sprays into both nostrils daily as needed for allergies.  04/07/13  Yes Historical Provider, MD  furosemide (LASIX) 40 MG tablet Take 2 tablets (80 mg total) by mouth daily. with food Patient taking differently: Take 40 mg by mouth 2 (two) times daily. with food 08/17/15  Yes Jettie Booze, MD  hydrALAZINE (APRESOLINE) 25 MG tablet Take 1 tablet (25 mg total) by mouth 3 (three) times daily. 05/31/15  Yes Jettie Booze, MD  loratadine (CLARITIN) 10 MG tablet Take 10 mg by mouth daily. For allergies   Yes Historical Provider, MD  losartan (COZAAR) 50 MG tablet Take 50 mg by mouth daily. 06/08/15  Yes Historical Provider, MD  potassium chloride (K-DUR) 10 MEQ tablet Take 2 tablets (20 mEq total) by mouth daily. Patient taking differently: Take 10 mEq by mouth 2 (two) times daily.  08/14/15  Yes Jettie Booze, MD  PROAIR HFA 108 [phone] BASE) MCG/ACT inhaler INHALE 2 PUFFS EVERY 4 HOURS AS NEEDED FOR WHEEZING OR SHORTNESS OF BREATH. 09/05/14  Yes Collene Gobble, MD  warfarin (COUMADIN) 1 MG tablet Take 2 tabs daily except 3 tabs on Wednesday & Saturday or as directed by [facility] Patient taking differently: Take 2-3 mg by mouth daily. Take 2 tabs daily except 3 tabs on Wednesday & Saturday or as directed by [facility] 05/16/15  Yes Jettie Booze, MD  guaiFENesin (ROBITUSSIN) 100 MG/5ML SOLN Take 5 mLs (100 mg total) by mouth every 6 (six) hours as needed for cough or to loosen phlegm. Patient not taking: Reported on 08/24/2015 03/09/15   Robbie Lis, MD  levalbuterol Penne Lash) 0.63 MG/3ML nebulizer  solution Take 3 mLs (0.63 mg total) by nebulization every 6 (six) hours as needed for wheezing or shortness of breath. Patient not taking: Reported on 08/24/2015 03/09/15   Robbie Lis, MD  mometasone-formoterol [facility]) 100-5 MCG/ACT AERO Inhale 2 puffs into the lungs 2 (two) times daily. Patient not taking: Reported on 08/24/2015 11/26/13   Kinnie Feil, MD  senna-docusate (SENOKOT-S) 8.6-50 MG tablet Take 1 tablet by mouth at bedtime as needed for mild constipation. Patient not taking: Reported on 08/24/2015 03/09/15   Robbie Lis, MD    FAMILY HISTORY: Family History  Problem Relation Age of Onset  . Heart disease Father   . Cancer Father     LUNG  . Colon cancer Mother   . Stroke Paternal Uncle   . Heart attack Neg Hx   . Hyperlipidemia Neg Hx   . Hypertension Neg Hx     SOCIAL HISTORY:  reports that he quit smoking about 35 years  ago. His smoking use included Cigarettes. He has a 200.00 pack-year smoking history. He has never used smokeless tobacco. He reports that he does not drink alcohol or use drugs.  PHYSICAL EXAM: Blood pressure (!) 145/45, pulse 100, temperature 97.8 F (36.6 C), temperature source Oral, resp. rate 20, height 5\' 8"  (1.727 m), weight 73.6 kg (162 lb 3.2 oz), SpO2 98 %.  General appearance :Awake, alert, not in any distress. Speech Clear. Not toxic Looking HEENT: Atraumatic and Normocephalic, pupils equally reactive to light and accomodation Neck: supple, no JVD. No cervical lymphadenopathy.  Chest:Good air entry bilaterally, coarse rhonchi all over  CVS: S1 S2 regular, no murmurs.  Abdomen: Bowel sounds present, Non tender and not distended with no gaurding, rigidity or rebound. Extremities: B/L Lower Ext shows trace edema, both legs are warm to touch Neurology:  Non focal Psychiatric: Normal judgment and insight. Alert and oriented x 3. Normal mood. Skin:No Rash Wounds:N/A  LABS ON ADMISSION:  I have personally reviewed following labs and imaging  studies  CBC:  Recent Labs Lab 08/24/15 1324  WBC 14.8*  NEUTROABS 11.9*  HGB 13.5  HCT 40.4  MCV 101.5*  PLT 123*    Basic Metabolic Panel:  Recent Labs Lab 08/24/15 1324  NA 138  K 4.4  CL 101  CO2 30  GLUCOSE 121*  BUN 36*  CREATININE 1.79*  CALCIUM 9.1    GFR: Estimated Creatinine Clearance: 34.5 mL/min (by C-G formula based on SCr of 1.79 mg/dL).  Liver Function Tests: No results for input(s): AST, ALT, ALKPHOS, BILITOT, PROT, ALBUMIN in the last 168 hours. No results for input(s): LIPASE, AMYLASE in the last 168 hours. No results for input(s): AMMONIA in the last 168 hours.  Coagulation Profile:  Recent Labs Lab 08/24/15 1324  INR 3.00    Cardiac Enzymes: No results for input(s): CKTOTAL, CKMB, CKMBINDEX, TROPONINI in the last 168 hours.  BNP (last 3 results) No results for input(s): PROBNP in the last 8760 hours.  HbA1C: No results for input(s): HGBA1C in the last 72 hours.  CBG: No results for input(s): GLUCAP in the last 168 hours.  Lipid Profile: No results for input(s): CHOL, HDL, LDLCALC, TRIG, CHOLHDL, LDLDIRECT in the last 72 hours.  Thyroid Function Tests: No results for input(s): TSH, T4TOTAL, FREET4, T3FREE, THYROIDAB in the last 72 hours.  Anemia Panel: No results for input(s): VITAMINB12, FOLATE, FERRITIN, TIBC, IRON, RETICCTPCT in the last 72 hours.  Urine analysis:    Component Value Date/Time   COLORURINE YELLOW 03/05/2015 1345   APPEARANCEUR CLOUDY (A) 03/05/2015 1345   LABSPEC 1.011 03/05/2015 1345   PHURINE 5.0 03/05/2015 1345   GLUCOSEU NEGATIVE 03/05/2015 1345   HGBUR NEGATIVE 03/05/2015 1345   BILIRUBINUR NEGATIVE 03/05/2015 1345   KETONESUR NEGATIVE 03/05/2015 1345   PROTEINUR NEGATIVE 03/05/2015 1345   UROBILINOGEN 0.2 11/21/2013 1946   NITRITE NEGATIVE 03/05/2015 1345   LEUKOCYTESUR NEGATIVE 03/05/2015 1345    Sepsis Labs: Lactic Acid, Venous    Component Value Date/Time   LATICACIDVEN 1.13  03/05/2015 1432     Microbiology: No results found for this or any previous visit (from the past 240 hour(s)).    RADIOLOGIC STUDIES ON ADMISSION: Dg Chest 2 View  Result Date: 08/24/2015 CLINICAL DATA:  Short of breath to 3 days. EXAM: CHEST  2 VIEW COMPARISON:  Radiograph 03/06/2015 FINDINGS: LEFT-sided pacemaker with 2 continuous leads overlies normal cardiac silhouette. Lungs are hyperinflated. There is mild airspace disease in the RIGHT LEFT lower lobe. No  pneumothorax. Small effusions. IMPRESSION: 1. Bibasilar airspace disease consistent with multifocal pneumonia versus pulmonary edema. Recommend clinical correlation. 2. Hyperinflated lungs. Electronically Signed   By: Suzy Bouchard M.D.   On: 08/24/2015 14:22    I have personally reviewed images of chest xray   EKG:  Personally reviewed. Paced rhythm  ASSESSMENT AND PLAN: Present on Admission: . Community acquired pneumonia: Start empiric Rocephin and Zithromax, await sputum/blood cultures. Follow clinically.  . Chronic obstructive pulmonary disease with acute exacerbation: Start intravenous steroids, scheduled bronchodilators, empiric antibiotics as above. Encourage incentive spirometry. Follow and taper steroids accordingly.  . Coronary artery disease involving native coronary artery of native heart without angina pectoris: Without chest pain-suspect not on aspirin as on Coumadin. Continue statin and beta blocker.  . Chronic systolic heart failure: Appears compensated. Continue Lasix, Coreg.losartan and digoxin. Digoxin levels will be obtained. Follow weights and strict I&O's.  . Paroxysmal atrial fibrillation: Monitor in telemetry, rate controlled with beta blocker-on Coumadin-we'll asked pharmacy to manage while inpatient  . Biventricular implantable cardioverter-defibrillator in situ  . BPH (benign prostatic hyperplasia): Continue with Alfuzosin  . Chronic kidney disease, stage 3: Creatinine appears to be close to his  usual baseline, follow  Further plan will depend as patient's clinical course evolves and further radiologic and laboratory data become available. Patient will be monitored closely.  Above noted plan was discussed with patient face to face at bedside, the was in agreement.   CONSULTS: None  DVT Prophylaxis: On coumadin  Code Status: Full Code  Disposition Plan:  Discharge back home  in 2-3 days, depending on clinical course  Admission status:  Inpatient going to tele  Total time spent  55 minutes.Greater than 50% of this time was spent in counseling, explanation of diagnosis, planning of further management, and coordination of care.  [facility] Pager [phone]  If 7PM-7AM, please contact night-coverage [email or web address] Password [facility] 08/24/2015, 4:41 PM

## 2015-08-24 NOTE — ED Triage Notes (Signed)
Patient presents for SOB x2-3 days. Wears 2L [place] intermittently. O2 86% on RA. Denies CP pain, no swelling, no fever, non productive cough, no lightheadedness or dizziness.

## 2015-08-24 NOTE — Progress Notes (Signed)
ANTICOAGULATION CONSULT NOTE - Initial Consult  Pharmacy Consult for Warfarin Indication: atrial fibrillation  Allergies  Allergen Reactions  . Benadryl [Diphenhydramine Hcl] Nausea And Vomiting   Patient Measurements: Height: 5\' 8"  (172.7 cm) Weight: 162 lb 3.2 oz (73.6 kg) IBW/kg (Calculated) : 68.4   Vital Signs: Temp: 97.8 F (36.6 C) (08/03 1631) Temp Source: Oral (08/03 1631) BP: 145/45 (08/03 1631) Pulse Rate: 100 (08/03 1631)  Labs:  Recent Labs  08/24/15 1324  HGB 13.5  HCT 40.4  PLT 123*  LABPROT 31.8*  INR 3.00  CREATININE 1.79*   Estimated Creatinine Clearance: 34.5 mL/min (by C-G formula based on SCr of 1.79 mg/dL).  Medical History: Past Medical History:  Diagnosis Date  . Atrial fibrillation ([place])   . CAD (coronary artery disease) 1996   status post PCI of the RCA   . Congestive heart failure, unspecified   . COPD (chronic obstructive pulmonary disease) (HCC)    Pt on home O2 at night and PRN, unsure of date of diagnosis.  . Diabetes mellitus, type 2 ([place])   . DJD (degenerative joint disease)   . GERD (gastroesophageal reflux disease)   . Gout   . HTN (hypertension)   . Ischemic dilated cardiomyopathy   . Nephrolithiasis   . Other primary cardiomyopathies   . Personal history of DVT (deep vein thrombosis)   . Prostatitis    Medications:  Scheduled:  . [START ON 08/25/2015] alfuzosin  10 mg Oral Q breakfast  . [START ON 08/25/2015] allopurinol  100 mg Oral Daily  . atorvastatin  10 mg Oral QHS  . [START ON 08/25/2015] azithromycin  500 mg Oral Q24H  . carvedilol  12.5 mg Oral BID WC  . [START ON 08/25/2015] cefTRIAXone (ROCEPHIN)  IV  1 g Intravenous Q24H  . [START ON 08/25/2015] digoxin  0.0625 mg Oral Daily  . furosemide  40 mg Oral BID  . guaiFENesin  600 mg Oral BID  . hydrALAZINE  25 mg Oral TID  . ipratropium-albuterol  3 mL Nebulization Q6H  . levofloxacin (LEVAQUIN) IV  750 mg Intravenous Once  . [START ON 08/25/2015] loratadine  10 mg  Oral Daily  . losartan  50 mg Oral Daily  . methylPREDNISolone (SOLU-MEDROL) injection  60 mg Intravenous Q8H  . pantoprazole  40 mg Oral Daily  . [START ON 08/25/2015] potassium chloride  20 mEq Oral Daily  . sodium chloride flush  3 mL Intravenous Q12H   Assessment: 50 yoM with hx of Afib on chronic Warfarin, to ED with cough and SHOB.  Resume Warfarin dosed per Pharmacy, admit INR 3.0 on home dose 3mg  Wed,Sat; 2mg  other days with last dose 8/2 at 1730.  Abx may increase INR, would see effect in 2-3 days  Regular diet   Goal of Therapy:  INR 2-3 Monitor platelets by anticoagulation protocol: Yes   Plan:   Daily PT/INR  Warfarin 2mg  today at [place], Jessie PharmD Pager [phone] 08/24/2015, 4:59 PM

## 2015-08-24 NOTE — ED Notes (Signed)
Admitting nurse at the bedside.

## 2015-08-25 DIAGNOSIS — N183 Chronic kidney disease, stage 3 (moderate): Secondary | ICD-10-CM

## 2015-08-25 DIAGNOSIS — N400 Enlarged prostate without lower urinary tract symptoms: Secondary | ICD-10-CM

## 2015-08-25 DIAGNOSIS — I2510 Atherosclerotic heart disease of native coronary artery without angina pectoris: Secondary | ICD-10-CM

## 2015-08-25 DIAGNOSIS — Z95810 Presence of automatic (implantable) cardiac defibrillator: Secondary | ICD-10-CM

## 2015-08-25 DIAGNOSIS — J441 Chronic obstructive pulmonary disease with (acute) exacerbation: Secondary | ICD-10-CM

## 2015-08-25 DIAGNOSIS — J189 Pneumonia, unspecified organism: Secondary | ICD-10-CM

## 2015-08-25 DIAGNOSIS — I480 Paroxysmal atrial fibrillation: Secondary | ICD-10-CM

## 2015-08-25 LAB — CBC
HCT: 37 % — ABNORMAL LOW (ref 39.0–52.0)
Hemoglobin: 12.5 g/dL — ABNORMAL LOW (ref 13.0–17.0)
MCH: 33.6 pg (ref 26.0–34.0)
MCHC: 33.8 g/dL (ref 30.0–36.0)
MCV: 99.5 fL (ref 78.0–100.0)
PLATELETS: 136 10*3/uL — AB (ref 150–400)
RBC: 3.72 MIL/uL — ABNORMAL LOW (ref 4.22–5.81)
RDW: 14.1 % (ref 11.5–15.5)
WBC: 11.2 10*3/uL — AB (ref 4.0–10.5)

## 2015-08-25 LAB — LEGIONELLA PNEUMOPHILA SEROGP 1 UR AG: L. PNEUMOPHILA SEROGP 1 UR AG: NEGATIVE

## 2015-08-25 LAB — BASIC METABOLIC PANEL
Anion gap: 9 (ref 5–15)
BUN: 38 mg/dL — ABNORMAL HIGH (ref 6–20)
CALCIUM: 9 mg/dL (ref 8.9–10.3)
CO2: 26 mmol/L (ref 22–32)
CREATININE: 1.59 mg/dL — AB (ref 0.61–1.24)
Chloride: 102 mmol/L (ref 101–111)
GFR, EST AFRICAN AMERICAN: 47 mL/min — AB (ref >60)
GFR, EST NON AFRICAN AMERICAN: 41 mL/min — AB (ref >60)
Glucose, Bld: 163 mg/dL — ABNORMAL HIGH (ref 65–99)
Potassium: 4 mmol/L (ref 3.5–5.1)
SODIUM: 137 mmol/L (ref 135–145)

## 2015-08-25 LAB — PROTIME-INR
INR: 3.52
Prothrombin Time: 36.1 seconds — ABNORMAL HIGH (ref 11.4–15.2)

## 2015-08-25 LAB — EXPECTORATED SPUTUM ASSESSMENT W GRAM STAIN, RFLX TO RESP C

## 2015-08-25 LAB — EXPECTORATED SPUTUM ASSESSMENT W REFEX TO RESP CULTURE

## 2015-08-25 LAB — HIV ANTIBODY (ROUTINE TESTING W REFLEX): HIV Screen 4th Generation wRfx: NONREACTIVE

## 2015-08-25 NOTE — Progress Notes (Signed)
ANTICOAGULATION CONSULT NOTE - Initial Consult  Pharmacy Consult for Warfarin Indication: atrial fibrillation  Allergies  Allergen Reactions  . Benadryl [Diphenhydramine Hcl] Nausea And Vomiting   Patient Measurements: Height: 5\' 8"  (172.7 cm) Weight: 162 lb 3.2 oz (73.6 kg) IBW/kg (Calculated) : 68.4   Vital Signs: Temp: 97.4 F (36.3 C) (08/04 0541) Temp Source: Oral (08/04 0541) BP: 126/68 (08/04 0541) Pulse Rate: 68 (08/04 0541)  Labs:  Recent Labs  08/24/15 1324 08/25/15 0556 08/25/15 0859  HGB 13.5 12.5*  --   HCT 40.4 37.0*  --   PLT 123* 136*  --   LABPROT 31.8*  --  36.1*  INR 3.00  --  3.52  CREATININE 1.79* 1.59*  --    Estimated Creatinine Clearance: 38.8 mL/min (by C-G formula based on SCr of 1.59 mg/dL).  Medical History: Past Medical History:  Diagnosis Date  . Atrial fibrillation ([place])   . CAD (coronary artery disease) 1996   status post PCI of the RCA   . Congestive heart failure, unspecified   . COPD (chronic obstructive pulmonary disease) (HCC)    Pt on home O2 at night and PRN, unsure of date of diagnosis.  . Diabetes mellitus, type 2 ([place])   . DJD (degenerative joint disease)   . GERD (gastroesophageal reflux disease)   . Gout   . HTN (hypertension)   . Ischemic dilated cardiomyopathy   . Nephrolithiasis   . Other primary cardiomyopathies   . Personal history of DVT (deep vein thrombosis)   . Prostatitis    Medications:  Scheduled:  . alfuzosin  10 mg Oral Q breakfast  . allopurinol  100 mg Oral Daily  . antiseptic oral rinse  7 mL Mouth Rinse q12n4p  . atorvastatin  10 mg Oral QHS  . azithromycin  500 mg Oral Q24H  . carvedilol  12.5 mg Oral BID WC  . cefTRIAXone (ROCEPHIN)  IV  1 g Intravenous Q24H  . chlorhexidine  15 mL Mouth Rinse BID  . digoxin  0.0625 mg Oral Daily  . furosemide  40 mg Oral BID  . guaiFENesin  600 mg Oral BID  . hydrALAZINE  25 mg Oral TID  . ipratropium-albuterol  3 mL Nebulization QID  . loratadine   10 mg Oral Daily  . losartan  50 mg Oral Daily  . methylPREDNISolone (SOLU-MEDROL) injection  60 mg Intravenous Q8H  . pantoprazole  40 mg Oral Daily  . potassium chloride  20 mEq Oral Daily  . sodium chloride flush  3 mL Intravenous Q12H  . Warfarin - Pharmacist Dosing Inpatient   Does not apply q1800   Assessment: 89 yoM with hx of Afib on chronic Warfarin, to ED with cough and SHOB.  Resume Warfarin dosed per Pharmacy, admit INR 3.0 on home dose 3mg  Wed,Sat; 2mg  other days with last dose 8/2 at 1730.  INR supratherapeutic (3.52)  H/H slightly decreased; Plts stable  No bleeding reported  Abx may potentiate effects of warfarin resulting in increased INR  Regular diet   Goal of Therapy:  INR 2-3 Monitor platelets by anticoagulation protocol: Yes   Plan:   No warfarin today  Monitor for signs/symptoms of bleeding  Daily PT/INR  Peggyann Juba, PharmD, BCPS Pager: [phone] 08/25/2015, 10:10 AM

## 2015-08-25 NOTE — Progress Notes (Signed)
PROGRESS NOTE    Troy [name]  [id number]  DOB: 02-Apr-1940  DOA: 08/24/2015 PCP: Barbette Merino, MD Outpatient Specialists:  Hospital course: Troy [name] is a 75 y.o. male with medical history significant of atrial fibrillation on Coumadin, permanent pacemaker/defibrillator implantation, chronic systolic heart failure with last EF around 20-25% in 2015 presents, chronic hypoxic respiratory failure on home O2, chronic kidney disease stage III to the hospital for evaluation of worsening shortness of breath and cough. Per patient for the past 2- 3 days he started developing shortness of breath that is very typical for his COPD flare. Shortness of breath has gradually worsened, and it has now been associated with a cough that is mostly dry. He claims that he did have subjective fever at home with chills and sweats. He has been taking his bronchodilators at home without any significant relief, as a result he presented to the ED for further evaluation and treatment.  Assessment & Plan:   . Community acquired pneumonia: Start empiric Rocephin and Zithromax, await sputum/blood cultures. Follow clinically.  . Chronic obstructive pulmonary disease with acute exacerbation: Start intravenous steroids, scheduled bronchodilators, empiric antibiotics as above. Encourage incentive spirometry. Follow and taper steroids accordingly.  . Coronary artery disease involving native coronary artery of native heart without angina pectoris: Without chest pain-suspect not on aspirin as on Coumadin. Continue statin and beta blocker.  . Chronic systolic heart failure: Appears compensated. Continue Lasix, Coreg.losartan and digoxin. Digoxin levels will be obtained. Follow weights and strict I&O's.  . Paroxysmal atrial fibrillation: Monitor in telemetry, rate controlled with beta blocker-on Coumadin-we'll asked pharmacy to manage while inpatient  . Biventricular implantable cardioverter-defibrillator in  situ  . BPH (benign prostatic hyperplasia): Continue with Alfuzosin  . Chronic kidney disease, stage 3: Creatinine appears to be close to his usual baseline, follow  DVT prophylaxis: warfarin Code Status: FULL Family Communication: none at bedside   Antimicrobials: Anti-infectives    Start     Dose/Rate Route Frequency Ordered Stop   08/25/15 1600  cefTRIAXone (ROCEPHIN) 1 g in dextrose 5 % 50 mL IVPB     1 g 100 mL/hr over 30 Minutes Intravenous Every 24 hours 08/24/15 1638 09/01/15 1559   08/25/15 1600  azithromycin (ZITHROMAX) tablet 500 mg     500 mg Oral Every 24 hours 08/24/15 1638 09/01/15 1559   08/24/15 1500  levofloxacin (LEVAQUIN) IVPB 750 mg     750 mg 100 mL/hr over 90 Minutes Intravenous  Once 08/24/15 1448 08/24/15 2034       Subjective: Pt sitting up in chair, says he is starting to feel better.   Objective: Vitals:   08/25/15 0541 08/25/15 0755 08/25/15 1149 08/25/15 1321  BP: 126/68   (!) 142/52  Pulse: 68   79  Resp: 18   18  Temp: 97.4 F (36.3 C)   97.5 F (36.4 C)  TempSrc: Oral   Oral  SpO2: 97% 95% 92% 98%  Weight:      Height:        Intake/Output Summary (Last 24 hours) at 08/25/15 1520 Last data filed at 08/25/15 1229  Gross per 24 hour  Intake              360 ml  Output              850 ml  Net             -490 ml   Filed Weights   08/24/15 1631  Weight: 73.6 kg (  162 lb 3.2 oz)    Exam:  General appearance :Awake, alert, not in any distress. Speech Clear. Not toxic Looking HEENT: Atraumatic and Normocephalic, pupils equally reactive to light and accomodation Neck: supple, no JVD. No cervical lymphadenopathy.  Chest:Good air entry bilaterally, coarse rhonchi all over  CVS: S1 S2 regular, no murmurs.  Abdomen: Bowel sounds present, Non tender and not distended with no gaurding, rigidity or rebound. Extremities: B/L Lower Ext shows trace edema, both legs are warm to touch Neurology:  Non focal Psychiatric: Normal judgment  and insight. Alert and oriented x 3. Normal mood. Skin:No Rash  Data Reviewed: Basic Metabolic Panel:  Recent Labs Lab 08/24/15 1324 08/25/15 0556  NA 138 137  K 4.4 4.0  CL 101 102  CO2 30 26  GLUCOSE 121* 163*  BUN 36* 38*  CREATININE 1.79* 1.59*  CALCIUM 9.1 9.0   Liver Function Tests: No results for input(s): AST, ALT, ALKPHOS, BILITOT, PROT, ALBUMIN in the last 168 hours. No results for input(s): LIPASE, AMYLASE in the last 168 hours. No results for input(s): AMMONIA in the last 168 hours. CBC:  Recent Labs Lab 08/24/15 1324 08/25/15 0556  WBC 14.8* 11.2*  NEUTROABS 11.9*  --   HGB 13.5 12.5*  HCT 40.4 37.0*  MCV 101.5* 99.5  PLT 123* 136*   Cardiac Enzymes: No results for input(s): CKTOTAL, CKMB, CKMBINDEX, TROPONINI in the last 168 hours. CBG (last 3)  No results for input(s): GLUCAP in the last 72 hours. Recent Results (from the past 240 hour(s))  Blood culture (routine x 2)     Status: None (Preliminary result)   Collection Time: 08/24/15  3:09 PM  Result Value Ref Range Status   Specimen Description BLOOD RIGHT ANTECUBITAL  Final   Special Requests BOTTLES DRAWN AEROBIC AND ANAEROBIC 5ML  Final   Culture   Final    NO GROWTH < 24 HOURS Performed at [facility]    Report Status PENDING  Incomplete  Blood culture (routine x 2)     Status: None (Preliminary result)   Collection Time: 08/24/15  3:25 PM  Result Value Ref Range Status   Specimen Description BLOOD LEFT ANTECUBITAL  Final   Special Requests BOTTLES DRAWN AEROBIC AND ANAEROBIC 5ML  Final   Culture   Final    NO GROWTH < 24 HOURS Performed at [facility]    Report Status PENDING  Incomplete  Culture, sputum-assessment     Status: None   Collection Time: 08/25/15  8:53 AM  Result Value Ref Range Status   Specimen Description SPUTUM  Final   Special Requests NONE  Final   Sputum evaluation   Final    THIS SPECIMEN IS ACCEPTABLE. RESPIRATORY CULTURE REPORT TO FOLLOW.    Report Status 08/25/2015 FINAL  Final  Culture, respiratory (NON-Expectorated)     Status: None (Preliminary result)   Collection Time: 08/25/15  9:00 AM  Result Value Ref Range Status   Specimen Description SPUTUM  Final   Special Requests NONE  Final   Gram Stain   Final    MODERATE WBC PRESENT, PREDOMINANTLY PMN FEW GRAM POSITIVE COCCI IN PAIRS IN CHAINS FEW GRAM NEGATIVE COCCOBACILLI Performed at [facility]    Culture PENDING  Incomplete   Report Status PENDING  Incomplete     Studies: Dg Chest 2 View  Result Date: 08/24/2015 CLINICAL DATA:  Short of breath to 3 days. EXAM: CHEST  2 VIEW COMPARISON:  Radiograph 03/06/2015 FINDINGS: LEFT-sided  pacemaker with 2 continuous leads overlies normal cardiac silhouette. Lungs are hyperinflated. There is mild airspace disease in the RIGHT LEFT lower lobe. No pneumothorax. Small effusions. IMPRESSION: 1. Bibasilar airspace disease consistent with multifocal pneumonia versus pulmonary edema. Recommend clinical correlation. 2. Hyperinflated lungs. Electronically Signed   By: Suzy Bouchard M.D.   On: 08/24/2015 14:22     Scheduled Meds: . alfuzosin  10 mg Oral Q breakfast  . allopurinol  100 mg Oral Daily  . antiseptic oral rinse  7 mL Mouth Rinse q12n4p  . atorvastatin  10 mg Oral QHS  . azithromycin  500 mg Oral Q24H  . carvedilol  12.5 mg Oral BID WC  . cefTRIAXone (ROCEPHIN)  IV  1 g Intravenous Q24H  . chlorhexidine  15 mL Mouth Rinse BID  . digoxin  0.0625 mg Oral Daily  . furosemide  40 mg Oral BID  . guaiFENesin  600 mg Oral BID  . hydrALAZINE  25 mg Oral TID  . ipratropium-albuterol  3 mL Nebulization QID  . loratadine  10 mg Oral Daily  . losartan  50 mg Oral Daily  . methylPREDNISolone (SOLU-MEDROL) injection  60 mg Intravenous Q8H  . pantoprazole  40 mg Oral Daily  . potassium chloride  20 mEq Oral Daily  . sodium chloride flush  3 mL Intravenous Q12H  . Warfarin - Pharmacist Dosing Inpatient   Does not apply  q1800   Continuous Infusions:   Principal Problem:   Community acquired pneumonia Active Problems:   Biventricular implantable cardioverter-defibrillator in situ   Chronic kidney disease, stage 3   Coronary artery disease involving native coronary artery of native heart without angina pectoris   Paroxysmal atrial fibrillation (HCC)   Chronic obstructive pulmonary disease with acute exacerbation (HCC)   BPH (benign prostatic hyperplasia)  Time spent:   Troy Brakeman, MD, FAAFP [facility] Pager [phone]  If 7PM-7AM, please contact night-coverage [email or web address] Password [email or web address] 08/25/2015, 3:20 PM    LOS: 1 day

## 2015-08-26 LAB — PROTIME-INR
INR: 4.21 — AB
Prothrombin Time: 41.7 seconds — ABNORMAL HIGH (ref 11.4–15.2)

## 2015-08-26 MED ORDER — ZOLPIDEM TARTRATE 5 MG PO TABS
5.0000 mg | ORAL_TABLET | Freq: Every evening | ORAL | Status: DC | PRN
  Administered 2015-08-26: 5 mg via ORAL
  Filled 2015-08-26: qty 1

## 2015-08-26 MED ORDER — NON FORMULARY: 10.0000 mg | Freq: Every day | Status: DC

## 2015-08-26 MED ORDER — CETIRIZINE HCL 10 MG PO TABS
10.0000 mg | ORAL_TABLET | Freq: Every day | ORAL | Status: DC
  Administered 2015-08-26 – 2015-08-27 (×2): 10 mg via ORAL
  Filled 2015-08-26 (×2): qty 1

## 2015-08-26 MED ORDER — DEXTROMETHORPHAN POLISTIREX ER 30 MG/5ML PO SUER
30.0000 mg | Freq: Two times a day (BID) | ORAL | Status: DC | PRN
  Administered 2015-08-26 – 2015-08-27 (×2): 30 mg via ORAL
  Filled 2015-08-26 (×3): qty 5

## 2015-08-26 MED ORDER — METHYLPREDNISOLONE SODIUM SUCC 40 MG IJ SOLR
40.0000 mg | Freq: Three times a day (TID) | INTRAMUSCULAR | Status: DC
  Administered 2015-08-26 – 2015-08-27 (×3): 40 mg via INTRAVENOUS
  Filled 2015-08-26 (×3): qty 1

## 2015-08-26 MED ORDER — FLUTICASONE PROPIONATE 50 MCG/ACT NA SUSP
2.0000 | Freq: Every day | NASAL | Status: DC
  Administered 2015-08-26 – 2015-08-27 (×2): 2 via NASAL
  Filled 2015-08-26: qty 16

## 2015-08-26 NOTE — Progress Notes (Signed)
ANTICOAGULATION CONSULT NOTE - Initial Consult  Pharmacy Consult for Warfarin Indication: atrial fibrillation  Allergies  Allergen Reactions  . Benadryl [Diphenhydramine Hcl] Nausea And Vomiting   Patient Measurements: Height: 5\' 8"  (172.7 cm) Weight: 162 lb 3.2 oz (73.6 kg) IBW/kg (Calculated) : 68.4   Vital Signs: Temp: 97.7 F (36.5 C) (08/05 0445) Temp Source: Oral (08/05 0445) BP: 116/74 (08/05 0445) Pulse Rate: 67 (08/05 0445)  Labs:  Recent Labs  08/24/15 1324 08/25/15 0556 08/25/15 0859 08/26/15 0459  HGB 13.5 12.5*  --   --   HCT 40.4 37.0*  --   --   PLT 123* 136*  --   --   LABPROT 31.8*  --  36.1* 41.7*  INR 3.00  --  3.52 4.21*  CREATININE 1.79* 1.59*  --   --    Estimated Creatinine Clearance: 38.8 mL/min (by C-G formula based on SCr of 1.59 mg/dL).  Medical History: Past Medical History:  Diagnosis Date  . Atrial fibrillation ([place])   . CAD (coronary artery disease) 1996   status post PCI of the RCA   . Congestive heart failure, unspecified   . COPD (chronic obstructive pulmonary disease) (HCC)    Pt on home O2 at night and PRN, unsure of date of diagnosis.  . Diabetes mellitus, type 2 ([place])   . DJD (degenerative joint disease)   . GERD (gastroesophageal reflux disease)   . Gout   . HTN (hypertension)   . Ischemic dilated cardiomyopathy   . Nephrolithiasis   . Other primary cardiomyopathies   . Personal history of DVT (deep vein thrombosis)   . Prostatitis    Medications:  Scheduled:  . alfuzosin  10 mg Oral Q breakfast  . allopurinol  100 mg Oral Daily  . antiseptic oral rinse  7 mL Mouth Rinse q12n4p  . atorvastatin  10 mg Oral QHS  . azithromycin  500 mg Oral Q24H  . carvedilol  12.5 mg Oral BID WC  . cefTRIAXone (ROCEPHIN)  IV  1 g Intravenous Q24H  . chlorhexidine  15 mL Mouth Rinse BID  . digoxin  0.0625 mg Oral Daily  . furosemide  40 mg Oral BID  . guaiFENesin  600 mg Oral BID  . hydrALAZINE  25 mg Oral TID  .  ipratropium-albuterol  3 mL Nebulization QID  . loratadine  10 mg Oral Daily  . losartan  50 mg Oral Daily  . methylPREDNISolone (SOLU-MEDROL) injection  60 mg Intravenous Q8H  . pantoprazole  40 mg Oral Daily  . potassium chloride  20 mEq Oral Daily  . sodium chloride flush  3 mL Intravenous Q12H  . Warfarin - Pharmacist Dosing Inpatient   Does not apply q1800   Assessment: 42 yoM with hx of Afib on chronic Warfarin, to ED with cough and SOB.  Resume Warfarin dosed per Pharmacy, admit INR 3.0 on home dose 3mg  Wed,Sat; 2mg  other days with last dose 8/2 at 1730. 08/26/2015:  INR supratherapeutic (4.21)  H/H slightly decreased; Plts stable  No bleeding reported  Acute illness and Zithromax may potentiate effects of warfarin resulting in increased INR  Regular diet - eating 100%  Goal of Therapy:  INR 2-3   Plan:   No warfarin today  Monitor for signs/symptoms of bleeding  Daily PT/INR  Netta Cedars, PharmD, BCPS Pager: [phone] 08/26/2015, 8:50 AM

## 2015-08-26 NOTE — Progress Notes (Signed)
CRITICAL VALUE ALERT  Critical value received:  INR 4.21  Date of notification:  08/26/2015  Time of notification:  0630  Critical value read back:Yes.    Nurse who received alert:  Carnella Guadalajara I   MD notified (1st page):  D. Crosley  Time of first page:  [phone]

## 2015-08-26 NOTE — Progress Notes (Signed)
PROGRESS NOTE    Troy [name]  [id number]  DOB: 1940/07/01  DOA: 08/24/2015 PCP: Barbette Merino, MD Outpatient Specialists:  Hospital course: Troy [name] is a 75 y.o. male with medical history significant of atrial fibrillation on Coumadin, permanent pacemaker/defibrillator implantation, chronic systolic heart failure with last EF around 20-25% in 2015 presents, chronic hypoxic respiratory failure on home O2, chronic kidney disease stage III to the hospital for evaluation of worsening shortness of breath and cough. Per patient for the past 2- 3 days he started developing shortness of breath that is very typical for his COPD flare. Shortness of breath has gradually worsened, and it has now been associated with a cough that is mostly dry. He claims that he did have subjective fever at home with chills and sweats. He has been taking his bronchodilators at home without any significant relief, as a result he presented to the ED for further evaluation and treatment.  Assessment & Plan:   . Community acquired pneumonia: Continue Rocephin and Zithromax, await sputum/blood cultures. Follow clinically. Slowly improving.  . Chronic obstructive pulmonary disease with acute exacerbation: Start intravenous steroids, scheduled bronchodilators, empiric antibiotics as above. Encourage incentive spirometry. Taper down steroids today.  Get PT eval.  . Coronary artery disease involving native coronary artery of native heart without angina pectoris: Without chest pain-suspect not on aspirin as on Coumadin. Continue statin and beta blocker.  . Chronic systolic heart failure: Appears compensated. Continue Lasix, Coreg. losartan and digoxin. Follow weights and strict I&O's. . Elevated INR - no active bleeding, hold warfarin dose. Recheck in AM.   . Paroxysmal atrial fibrillation: DC tele, rate controlled with beta blocker-on Coumadin-we'll asked pharmacy to manage while inpatient  . Biventricular  implantable cardioverter-defibrillator in situ  . Allergic Rhinitis - flonase and cetirizine.    Marland Kitchen BPH (benign prostatic hyperplasia): Continue with Alfuzosin  . Chronic kidney disease, stage 3: Creatinine appears to be close to his usual baseline, follow  DVT prophylaxis: warfarin Code Status: FULL Family Communication: none at bedside   Antimicrobials: Anti-infectives    Start     Dose/Rate Route Frequency Ordered Stop   08/25/15 1600  cefTRIAXone (ROCEPHIN) 1 g in dextrose 5 % 50 mL IVPB     1 g 100 mL/hr over 30 Minutes Intravenous Every 24 hours 08/24/15 1638 09/01/15 1559   08/25/15 1600  azithromycin (ZITHROMAX) tablet 500 mg     500 mg Oral Every 24 hours 08/24/15 1638 09/01/15 1559   08/24/15 1500  levofloxacin (LEVAQUIN) IVPB 750 mg     750 mg 100 mL/hr over 90 Minutes Intravenous  Once 08/24/15 1448 08/24/15 2034      Subjective: Pt sitting up in chair, Pt didn't sleep well.    Objective: Vitals:   08/25/15 1609 08/25/15 2059 08/26/15 0445 08/26/15 0814  BP:  (!) 118/51 116/74   Pulse:  78 67   Resp:  20 20   Temp:  97.9 F (36.6 C) 97.7 F (36.5 C)   TempSrc:  Oral Oral   SpO2: 98% 97% 94% 91%  Weight:      Height:        Intake/Output Summary (Last 24 hours) at 08/26/15 1252 Last data filed at 08/26/15 0900  Gross per 24 hour  Intake              240 ml  Output             1225 ml  Net             -  985 ml   Filed Weights   08/24/15 1631  Weight: 73.6 kg (162 lb 3.2 oz)    Exam:  General appearance :Awake, alert, not in any distress. Speech Clear. Not toxic Looking HEENT: Atraumatic and Normocephalic, pupils equally reactive to light and accomodation Neck: supple, no JVD. No cervical lymphadenopathy.  Chest:Good air entry bilaterally, coarse rhonchi all over  CVS: S1 S2 regular, no murmurs.  Abdomen: Bowel sounds present, Non tender and not distended with no gaurding, rigidity or rebound. Extremities: B/L Lower Ext shows trace edema, both  legs are warm to touch Neurology:  Non focal Psychiatric: Normal judgment and insight. Alert and oriented x 3. Normal mood. Skin:No Rash  Data Reviewed: Basic Metabolic Panel:  Recent Labs Lab 08/24/15 1324 08/25/15 0556  NA 138 137  K 4.4 4.0  CL 101 102  CO2 30 26  GLUCOSE 121* 163*  BUN 36* 38*  CREATININE 1.79* 1.59*  CALCIUM 9.1 9.0   Liver Function Tests: No results for input(s): AST, ALT, ALKPHOS, BILITOT, PROT, ALBUMIN in the last 168 hours. No results for input(s): LIPASE, AMYLASE in the last 168 hours. No results for input(s): AMMONIA in the last 168 hours. CBC:  Recent Labs Lab 08/24/15 1324 08/25/15 0556  WBC 14.8* 11.2*  NEUTROABS 11.9*  --   HGB 13.5 12.5*  HCT 40.4 37.0*  MCV 101.5* 99.5  PLT 123* 136*   Cardiac Enzymes: No results for input(s): CKTOTAL, CKMB, CKMBINDEX, TROPONINI in the last 168 hours. CBG (last 3)  No results for input(s): GLUCAP in the last 72 hours. Recent Results (from the past 240 hour(s))  Blood culture (routine x 2)     Status: None (Preliminary result)   Collection Time: 08/24/15  3:09 PM  Result Value Ref Range Status   Specimen Description BLOOD RIGHT ANTECUBITAL  Final   Special Requests BOTTLES DRAWN AEROBIC AND ANAEROBIC 5ML  Final   Culture   Final    NO GROWTH 1 DAY Performed at [facility]    Report Status PENDING  Incomplete  Blood culture (routine x 2)     Status: None (Preliminary result)   Collection Time: 08/24/15  3:25 PM  Result Value Ref Range Status   Specimen Description BLOOD LEFT ANTECUBITAL  Final   Special Requests BOTTLES DRAWN AEROBIC AND ANAEROBIC 5ML  Final   Culture   Final    NO GROWTH 1 DAY Performed at [facility]    Report Status PENDING  Incomplete  Culture, sputum-assessment     Status: None   Collection Time: 08/25/15  8:53 AM  Result Value Ref Range Status   Specimen Description SPUTUM  Final   Special Requests NONE  Final   Sputum evaluation   Final    THIS  SPECIMEN IS ACCEPTABLE. RESPIRATORY CULTURE REPORT TO FOLLOW.   Report Status 08/25/2015 FINAL  Final  Culture, respiratory (NON-Expectorated)     Status: None (Preliminary result)   Collection Time: 08/25/15  9:00 AM  Result Value Ref Range Status   Specimen Description SPUTUM  Final   Special Requests NONE  Final   Gram Stain   Final    MODERATE WBC PRESENT, PREDOMINANTLY PMN FEW GRAM POSITIVE COCCI IN PAIRS IN CHAINS FEW GRAM NEGATIVE COCCOBACILLI    Culture   Final    CULTURE REINCUBATED FOR BETTER GROWTH Performed at [facility]    Report Status PENDING  Incomplete     Studies: Dg Chest 2 View  Result Date:  08/24/2015 CLINICAL DATA:  Short of breath to 3 days. EXAM: CHEST  2 VIEW COMPARISON:  Radiograph 03/06/2015 FINDINGS: LEFT-sided pacemaker with 2 continuous leads overlies normal cardiac silhouette. Lungs are hyperinflated. There is mild airspace disease in the RIGHT LEFT lower lobe. No pneumothorax. Small effusions. IMPRESSION: 1. Bibasilar airspace disease consistent with multifocal pneumonia versus pulmonary edema. Recommend clinical correlation. 2. Hyperinflated lungs. Electronically Signed   By: Suzy Bouchard M.D.   On: 08/24/2015 14:22     Scheduled Meds: . alfuzosin  10 mg Oral Q breakfast  . allopurinol  100 mg Oral Daily  . antiseptic oral rinse  7 mL Mouth Rinse q12n4p  . atorvastatin  10 mg Oral QHS  . azithromycin  500 mg Oral Q24H  . carvedilol  12.5 mg Oral BID WC  . cefTRIAXone (ROCEPHIN)  IV  1 g Intravenous Q24H  . chlorhexidine  15 mL Mouth Rinse BID  . digoxin  0.0625 mg Oral Daily  . fluticasone  2 spray Each Nare Daily  . furosemide  40 mg Oral BID  . guaiFENesin  600 mg Oral BID  . hydrALAZINE  25 mg Oral TID  . ipratropium-albuterol  3 mL Nebulization QID  . losartan  50 mg Oral Daily  . methylPREDNISolone (SOLU-MEDROL) injection  40 mg Intravenous Q8H  . NON FORMULARY 10 mg  10 mg Oral Daily  . pantoprazole  40 mg Oral Daily  .  potassium chloride  20 mEq Oral Daily  . sodium chloride flush  3 mL Intravenous Q12H  . Warfarin - Pharmacist Dosing Inpatient   Does not apply q1800   Continuous Infusions:   Principal Problem:   Community acquired pneumonia Active Problems:   Biventricular implantable cardioverter-defibrillator in situ   Chronic kidney disease, stage 3   Coronary artery disease involving native coronary artery of native heart without angina pectoris   Paroxysmal atrial fibrillation (HCC)   Chronic obstructive pulmonary disease with acute exacerbation (HCC)   BPH (benign prostatic hyperplasia)  Time spent:   Irwin Brakeman, MD, FAAFP [facility] Pager [phone]  If 7PM-7AM, please contact night-coverage [email or web address] Password [email or web address] 08/26/2015, 12:52 PM    LOS: 2 days

## 2015-08-27 LAB — CULTURE, RESPIRATORY

## 2015-08-27 LAB — CULTURE, RESPIRATORY W GRAM STAIN: Culture: NORMAL

## 2015-08-27 LAB — PROTIME-INR
INR: 4.31 — AB
PROTHROMBIN TIME: 42.5 s — AB (ref 11.4–15.2)

## 2015-08-27 MED ORDER — DEXTROMETHORPHAN POLISTIREX ER 30 MG/5ML PO SUER: 30.0000 mg | Freq: Two times a day (BID) | ORAL | 0 refills | Status: DC | PRN

## 2015-08-27 MED ORDER — AZITHROMYCIN 250 MG PO TABS: ORAL_TABLET | ORAL | 0 refills | Status: AC

## 2015-08-27 MED ORDER — PREDNISONE 20 MG PO TABS: ORAL_TABLET | ORAL | 0 refills | Status: AC

## 2015-08-27 MED ORDER — WARFARIN SODIUM 1 MG PO TABS: ORAL_TABLET | ORAL | Status: DC

## 2015-08-27 MED ORDER — CEFTRIAXONE SODIUM 1 G IJ SOLR
1.0000 g | INTRAMUSCULAR | Status: DC
  Filled 2015-08-27: qty 10

## 2015-08-27 NOTE — Progress Notes (Signed)
Patient discharged home with friend, discharge instructions given and explained to patient and he verbalized understanding, denies any pain.distress. No wound noted, skin intact. Accompanied by friend, transported to the car by staff.

## 2015-08-27 NOTE — Care Management Note (Signed)
Case Management Note  Patient Details  Name: Troy [name] MRN: [id number] Date of Birth: 03-06-1940  Subjective/Objective:       CHF, PNA, COPD             Action/Plan: Discharge Planning: AVS reviewed:  NCM spoke to pt and offered choice for HH/provided [facility] list. Pt requested [facility]. Contacted [facility] for [facility] RN, PT, SW and aide for scheduled dc today. Pt reports friend [name] assist with care as needed. Pt states he lives at home alone. And recalls his wife having an aide that come in to assist her in the past. Discussed with pt [place] Medicaid PCS, and faxed form to pt's PCP office. Gave pt instructions to follow up with PCP about services. Will have [place] SW to assist with getting services started.   PCP- Elwyn Reach MD  Expected Discharge Date:  08/27/2015              Expected Discharge Plan:  Icard  In-House Referral:  NA  Discharge planning Services  CM Consult  Post Acute Care Choice:  Home Health Choice offered to:  Patient  DME Arranged:  N/A DME Agency:  NA  HH Arranged:  PT, Nurse's Aide, Social Work [employer] Agency:  [place]  Status of Service:  Completed, signed off  If discussed at [employer], dates discussed:    Additional Comments:  Erenest Rasher, RN 08/27/2015, 12:03 PM

## 2015-08-27 NOTE — Progress Notes (Signed)
CRITICAL VALUE ALERT  Critical value received: INR 4.31  Date of notification:  08/27/15  Time of notification:  0612  Critical value read back:Yes.    Nurse who received alert:  Reynold Bowen, RN  MD notified (1st page):  Georges Mouse  Time of first page:  0630  MD notified (2nd page):  Time of second page:  Responding MD:  Georges Mouse  Time MD responded:  [phone]

## 2015-08-27 NOTE — Discharge Summary (Signed)
Physician Discharge Summary  TALLIN EHLY [id number] DOB: Aug 08, 1940 DOA: 08/24/2015  PCP: Barbette Merino, MD  Admit date: 08/24/2015 Discharge date: 08/27/2015  Admitted From: Home Disposition: Home  Recommendations for Outpatient Follow-up:  1. Follow up with PCP in 2-5 days to get PT/INR tested.  2. Please check PT/INR in 2-3 days.   Home Health: Yes, PT, Aide  Discharge Condition: Stable CODE STATUS: FULL  Brief/Interim Summary: Hospital course: Troy Pane Straderis a 75 y.o.malewith medical history significant ofatrial fibrillation on Coumadin, permanent pacemaker/defibrillator implantation, chronic systolic heart failure with last EF around 20-25% in 2015 presents, chronic hypoxic respiratory failure on home O2, chronic kidney disease stage III to the hospital for evaluation of worsening shortness of breath and cough. Per patient for the past 2- 3 days he started developing shortness of breath that is very typical for his COPD flare. Shortness of breath has gradually worsened, and it has now been associated with a cough that is mostly dry. He claims that he did have subjective fever at home with chills and sweats. He has been taking his bronchodilators at home without any significant relief, as a result he presented to the ED for further evaluation and treatment.  Assessment & Plan:   .Community acquired pneumonia: Improved, finish 2 more days of azithromycin.   .Chronic obstructive pulmonary disease with acute exacerbation:Improved with steroids, scheduled bronchodilators, empiric antibiotics as above. Home on 5 more days of prednisone.  .Coronary artery disease involving native coronary artery of native heart without angina pectoris:Without chest pain-suspect not on aspirin as on Coumadin. Continue statin and beta blocker.  .Chronic systolic heart failure: Appears compensated. Continue Lasix, Coreg. losartan and digoxin. Follow weights and strict I&O's. . Elevated  INR - no active bleeding, hold warfarin dose for next couple of days then restart. Recheck PT/INR with PCP or coumadin clinic in 2-3 days.  Instructions given to patient who verbalized understanding.   .Paroxysmal atrial fibrillation:  rate controlled with beta blocker-on warfarin for anticoagulation.  .Biventricular implantable cardioverter-defibrillator in situ - stable  . Allergic Rhinitis - flonase and cetirizine used in hospital.    .BPH (benign prostatic hyperplasia):Continue with Alfuzosin  .Chronic kidney disease, stage 3: Creatinine appears to be close to his usual baseline, follow  DVT prophylaxis: warfarin Code Status: FULL  Discharge Diagnoses:  Principal Problem:   Community acquired pneumonia Active Problems:   Biventricular implantable cardioverter-defibrillator in situ   Chronic kidney disease, stage 3   Coronary artery disease involving native coronary artery of native heart without angina pectoris   Paroxysmal atrial fibrillation (HCC)   Chronic obstructive pulmonary disease with acute exacerbation (HCC)   BPH (benign prostatic hyperplasia)  Discharge Instructions  Discharge Instructions    Diet - low sodium heart healthy    Complete by:  As directed   Discharge instructions    Complete by:  As directed   PLEASE DO NOT TAKE WARFARIN FOR THE NEXT 2 DAYS. RESTART WARFARIN ON WEDNESDAY, AUGUST 9.   Please have your PT/INR tested with your primary care physician in 2 days.  Or go to the Coumadin clinic to have here INR tested in 2 days' time.   Increase activity slowly    Complete by:  As directed       Medication List    STOP taking these medications   guaiFENesin 100 MG/5ML Soln Commonly known as:  ROBITUSSIN   levalbuterol 0.63 MG/3ML nebulizer solution Commonly known as:  XOPENEX   senna-docusate 8.6-50 MG tablet Commonly known as:  Senokot-S     TAKE these medications   alfuzosin 10 MG 24 hr tablet Commonly known as:  UROXATRAL Take 10  mg by mouth daily with breakfast.   allopurinol 100 MG tablet Commonly known as:  ZYLOPRIM Take 100 mg by mouth daily.   atorvastatin 10 MG tablet Commonly known as:  LIPITOR Take 1 tablet (10 mg total) by mouth at bedtime.   azithromycin 250 MG tablet Commonly known as:  ZITHROMAX Take 1 tab po daily Start taking on:  08/28/2015   carvedilol 12.5 MG tablet Commonly known as:  COREG Take 1 tablet (12.5 mg total) by mouth 2 (two) times daily with a meal.   dextromethorphan 30 MG/5ML liquid Commonly known as:  DELSYM Take 5 mLs (30 mg total) by mouth 2 (two) times daily as needed for cough.   digoxin 0.125 MG tablet Commonly known as:  LANOXIN Take 0.5 tablets (0.0625 mg total) by mouth daily.   fluticasone 50 MCG/ACT nasal spray Commonly known as:  FLONASE Place 2 sprays into both nostrils daily as needed for allergies.   furosemide 40 MG tablet Commonly known as:  LASIX Take 2 tablets (80 mg total) by mouth daily. with food What changed:  how much to take  when to take this  additional instructions   hydrALAZINE 25 MG tablet Commonly known as:  APRESOLINE Take 1 tablet (25 mg total) by mouth 3 (three) times daily.   loratadine 10 MG tablet Commonly known as:  CLARITIN Take 10 mg by mouth daily. For allergies   losartan 50 MG tablet Commonly known as:  COZAAR Take 50 mg by mouth daily.   mometasone-formoterol 100-5 MCG/ACT Aero Commonly known as:  DULERA Inhale 2 puffs into the lungs 2 (two) times daily.   potassium chloride 10 MEQ tablet Commonly known as:  K-DUR Take 2 tablets (20 mEq total) by mouth daily. What changed:  how much to take  when to take this   predniSONE 20 MG tablet Commonly known as:  DELTASONE Take 2 tabs once daily in the morning with breakfast. Start taking on:  08/28/2015   PROAIR HFA 108 (90 Base) MCG/ACT inhaler Generic drug:  albuterol INHALE 2 PUFFS EVERY 4 HOURS AS NEEDED FOR WHEEZING OR SHORTNESS OF BREATH.    albuterol 1.25 MG/3ML nebulizer solution Commonly known as:  ACCUNEB Take 3 mLs by nebulization 4 (four) times daily.   warfarin 1 MG tablet Commonly known as:  COUMADIN Take 2 tabs daily except 3 tabs on Wednesday & Saturday or as directed by Coumadin Clinic Start taking on:  08/30/2015 What changed:  how much to take  how to take this  when to take this  additional instructions      Follow-up Information    GARBA,LAWAL, MD. Schedule an appointment as soon as possible for a visit in 5 day(s).   Specialty:  Internal Medicine Why:  Hospital Follow Up Contact information: [address] [place] [address] [phone]        [place] Office. Schedule an appointment as soon as possible for a visit in 2 day(s).   Specialty:  Cardiology Why:  PLEASE GET PT/INR TESTED IN 2 DAYS AT COUMADIN CLINIC Contact information: [address] [place] [address] [phone]         Allergies  Allergen Reactions  . Benadryl [Diphenhydramine Hcl] Nausea And Vomiting   Procedures/Studies: Dg Chest 2 View  Result Date: 08/24/2015 CLINICAL DATA:  Short of breath  to 3 days. EXAM: CHEST  2 VIEW COMPARISON:  Radiograph 03/06/2015 FINDINGS: LEFT-sided pacemaker with 2 continuous leads overlies normal cardiac silhouette. Lungs are hyperinflated. There is mild airspace disease in the RIGHT LEFT lower lobe. No pneumothorax. Small effusions. IMPRESSION: 1. Bibasilar airspace disease consistent with multifocal pneumonia versus pulmonary edema. Recommend clinical correlation. 2. Hyperinflated lungs. Electronically Signed   By: Suzy Bouchard M.D.   On: 08/24/2015 14:22    Subjective: Pt breathing much better, feels like he is at his baseline.  He will need HHPT when he goes home which is being arranged and Home Aide.  Discharge Exam: Vitals:   08/27/15 0941 08/27/15 0943  BP: (!) 136/50   Pulse:  72  Resp:    Temp:     Vitals:    08/27/15 0741 08/27/15 0846 08/27/15 0941 08/27/15 0943  BP:  (!) 107/56 (!) 136/50   Pulse:  66  72  Resp:      Temp:      TempSrc:      SpO2: 95%     Weight:      Height:       General appearance :Awake, alert, not in any distress. Speech Clear. Not toxic Looking HEENT: Atraumatic and Normocephalic, pupils equally reactive to light and accomodation Neck: supple, no JVD. No cervical lymphadenopathy.  Chest:Good air entry bilaterally, clear to auscultation. CVS: S1 S2 regular, no murmurs.  Abdomen: Bowel sounds present, Non tender and not distended with no gaurding, rigidity or rebound. Extremities: B/L Lower Ext showstraceedema, both legs are warm to touch Neurology: Non focal Psychiatric: Normal judgment and insight. Alert and oriented x 3. Normal mood.           Skin:No Rash  The results of significant diagnostics from this hospitalization (including imaging, microbiology, ancillary and laboratory) are listed below for reference.     Microbiology: Recent Results (from the past 240 hour(s))  Blood culture (routine x 2)     Status: None (Preliminary result)   Collection Time: 08/24/15  3:09 PM  Result Value Ref Range Status   Specimen Description BLOOD RIGHT ANTECUBITAL  Final   Special Requests BOTTLES DRAWN AEROBIC AND ANAEROBIC 5ML  Final   Culture   Final    NO GROWTH 2 DAYS Performed at [facility]    Report Status PENDING  Incomplete  Blood culture (routine x 2)     Status: None (Preliminary result)   Collection Time: 08/24/15  3:25 PM  Result Value Ref Range Status   Specimen Description BLOOD LEFT ANTECUBITAL  Final   Special Requests BOTTLES DRAWN AEROBIC AND ANAEROBIC 5ML  Final   Culture   Final    NO GROWTH 2 DAYS Performed at [facility]    Report Status PENDING  Incomplete  Culture, sputum-assessment     Status: None   Collection Time: 08/25/15  8:53 AM  Result Value Ref Range Status   Specimen Description SPUTUM  Final   Special  Requests NONE  Final   Sputum evaluation   Final    THIS SPECIMEN IS ACCEPTABLE. RESPIRATORY CULTURE REPORT TO FOLLOW.   Report Status 08/25/2015 FINAL  Final  Culture, respiratory (NON-Expectorated)     Status: None (Preliminary result)   Collection Time: 08/25/15  9:00 AM  Result Value Ref Range Status   Specimen Description SPUTUM  Final   Special Requests NONE  Final   Gram Stain   Final    MODERATE WBC PRESENT, PREDOMINANTLY PMN FEW GRAM  POSITIVE COCCI IN PAIRS IN CHAINS FEW GRAM NEGATIVE COCCOBACILLI    Culture   Final    CULTURE REINCUBATED FOR BETTER GROWTH Performed at [facility]    Report Status PENDING  Incomplete    Labs: BNP (last 3 results)  Recent Labs  01/04/15 0825 01/20/15 1223 03/05/15 1605  BNP 188.0* 144.7* 0000000*   Basic Metabolic Panel:  Recent Labs Lab 08/24/15 1324 08/25/15 0556  NA 138 137  K 4.4 4.0  CL 101 102  CO2 30 26  GLUCOSE 121* 163*  BUN 36* 38*  CREATININE 1.79* 1.59*  CALCIUM 9.1 9.0   Liver Function Tests: No results for input(s): AST, ALT, ALKPHOS, BILITOT, PROT, ALBUMIN in the last 168 hours. No results for input(s): LIPASE, AMYLASE in the last 168 hours. No results for input(s): AMMONIA in the last 168 hours. CBC:  Recent Labs Lab 08/24/15 1324 08/25/15 0556  WBC 14.8* 11.2*  NEUTROABS 11.9*  --   HGB 13.5 12.5*  HCT 40.4 37.0*  MCV 101.5* 99.5  PLT 123* 136*   Cardiac Enzymes: No results for input(s): CKTOTAL, CKMB, CKMBINDEX, TROPONINI in the last 168 hours. BNP: Invalid input(s): POCBNP CBG: No results for input(s): GLUCAP in the last 168 hours. D-Dimer No results for input(s): DDIMER in the last 72 hours. Hgb A1c No results for input(s): HGBA1C in the last 72 hours. Lipid Profile No results for input(s): CHOL, HDL, LDLCALC, TRIG, CHOLHDL, LDLDIRECT in the last 72 hours. Thyroid function studies No results for input(s): TSH, T4TOTAL, T3FREE, THYROIDAB in the last 72 hours.  Invalid  input(s): FREET3 Anemia work up No results for input(s): VITAMINB12, FOLATE, FERRITIN, TIBC, IRON, RETICCTPCT in the last 72 hours. Urinalysis    Component Value Date/Time   COLORURINE YELLOW 03/05/2015 1345   APPEARANCEUR CLOUDY (A) 03/05/2015 1345   LABSPEC 1.011 03/05/2015 1345   PHURINE 5.0 03/05/2015 1345   GLUCOSEU NEGATIVE 03/05/2015 1345   HGBUR NEGATIVE 03/05/2015 1345   BILIRUBINUR NEGATIVE 03/05/2015 1345   KETONESUR NEGATIVE 03/05/2015 1345   PROTEINUR NEGATIVE 03/05/2015 1345   UROBILINOGEN 0.2 11/21/2013 1946   NITRITE NEGATIVE 03/05/2015 1345   LEUKOCYTESUR NEGATIVE 03/05/2015 1345   Sepsis Labs Invalid input(s): PROCALCITONIN,  WBC,  LACTICIDVEN Microbiology Recent Results (from the past 240 hour(s))  Blood culture (routine x 2)     Status: None (Preliminary result)   Collection Time: 08/24/15  3:09 PM  Result Value Ref Range Status   Specimen Description BLOOD RIGHT ANTECUBITAL  Final   Special Requests BOTTLES DRAWN AEROBIC AND ANAEROBIC 5ML  Final   Culture   Final    NO GROWTH 2 DAYS Performed at [facility]    Report Status PENDING  Incomplete  Blood culture (routine x 2)     Status: None (Preliminary result)   Collection Time: 08/24/15  3:25 PM  Result Value Ref Range Status   Specimen Description BLOOD LEFT ANTECUBITAL  Final   Special Requests BOTTLES DRAWN AEROBIC AND ANAEROBIC 5ML  Final   Culture   Final    NO GROWTH 2 DAYS Performed at [facility]    Report Status PENDING  Incomplete  Culture, sputum-assessment     Status: None   Collection Time: 08/25/15  8:53 AM  Result Value Ref Range Status   Specimen Description SPUTUM  Final   Special Requests NONE  Final   Sputum evaluation   Final    THIS SPECIMEN IS ACCEPTABLE. RESPIRATORY CULTURE REPORT TO FOLLOW.  Report Status 08/25/2015 FINAL  Final  Culture, respiratory (NON-Expectorated)     Status: None (Preliminary result)   Collection Time: 08/25/15  9:00 AM  Result  Value Ref Range Status   Specimen Description SPUTUM  Final   Special Requests NONE  Final   Gram Stain   Final    MODERATE WBC PRESENT, PREDOMINANTLY PMN FEW GRAM POSITIVE COCCI IN PAIRS IN CHAINS FEW GRAM NEGATIVE COCCOBACILLI    Culture   Final    CULTURE REINCUBATED FOR BETTER GROWTH Performed at [facility]    Report Status PENDING  Incomplete   Time coordinating discharge: 32 minutes  SIGNED:  Irwin Brakeman, MD  [facility] 08/27/2015, 11:12 AM Pager   If 7PM-7AM, please contact night-coverage [email or web address] Password [email or web address]

## 2015-08-27 NOTE — Discharge Instructions (Signed)
PLEASE DO NOT TAKE WARFARIN FOR THE NEXT 2 DAYS. RESTART WARFARIN ON WEDNESDAY, AUGUST 9.   Please have your PT/INR tested with your primary care physician in 2 days.  Or go to the Coumadin clinic to have here INR tested in 2 days' time.   Community-Acquired Pneumonia, Adult Pneumonia is an infection of the lungs. One type of pneumonia can happen while a person is in a hospital. A different type can happen when a person is not in a hospital (community-acquired pneumonia). It is easy for this kind to spread from person to person. It can spread to you if you breathe near an infected person who coughs or sneezes. Some symptoms include:  A dry cough.  A wet (productive) cough.  Fever.  Sweating.  Chest pain. HOME CARE  Take over-the-counter and prescription medicines only as told by your doctor.  Only take cough medicine if you are losing sleep.  If you were prescribed an antibiotic medicine, take it as told by your doctor. Do not stop taking the antibiotic even if you start to feel better.  Sleep with your head and neck raised (elevated). You can do this by putting a few pillows under your head, or you can sleep in a recliner.  Do not use tobacco products. These include cigarettes, chewing tobacco, and e-cigarettes. If you need help quitting, ask your doctor.  Drink enough water to keep your pee (urine) clear or pale yellow. A shot (vaccine) can help prevent pneumonia. Shots are often suggested for:  People older than 76 years of age.  People older than 76 years of age:  Who are having cancer treatment.  Who have long-term (chronic) lung disease.  Who have problems with their body's defense system (immune system). You may also prevent pneumonia if you take these actions:  Get the flu (influenza) shot every year.  Go to the dentist as often as told.  Wash your hands often. If soap and water are not available, use hand sanitizer. GET HELP IF:  You have a fever.  You lose  sleep because your cough medicine does not help. GET HELP RIGHT AWAY IF:  You are short of breath and it gets worse.  You have more chest pain.  Your sickness gets worse. This is very serious if:  You are an older adult.  Your body's defense system is weak.  You cough up blood.   This information is not intended to replace advice given to you by your health care provider. Make sure you discuss any questions you have with your health care provider.   Document Released: 06/26/2007 Document Revised: 09/28/2014 Document Reviewed: 05/04/2014 Elsevier Interactive Patient Education 2016 Else[employer] Inc[employer]    Chronic Obstructive Pulmonary Disease Chronic obstructive pulmonary disease (COPD) is a common lung problem. In COPD, the flow of air from the lungs is limited. The way your lungs work will probably never return to normal, but there are things you can do to improve your lungs and make yourself feel better. Your doctor may treat your condition with:  Medicines.  Oxygen.  Lung surgery.  Changes to your diet.  Rehabilitation. This may involve a team of specialists. HOME CARE  Take all medicines as told by your doctor.  Avoid medicines or cough syrups that dry up your airway (such as antihistamines) and do not allow you to get rid of thick spit. You do not need to avoid them if told differently by your doctor.  If you smoke, stop. Smoking makes the  problem worse.  Avoid being around things that make your breathing worse (like smoke, chemicals, and fumes).  Use oxygen therapy and therapy to help improve your lungs (pulmonary rehabilitation) if told by your doctor. If you need home oxygen therapy, ask your doctor if you should buy a tool to measure your oxygen level (oximeter).  Avoid people who have a sickness you can catch (contagious).  Avoid going outside when it is very hot, cold, or humid.  Eat healthy foods. Eat smaller meals more often. Rest before meals.  Stay active,  but remember to also rest.  Make sure to get all the shots (vaccines) your doctor recommends. Ask your doctor if you need a pneumonia shot.  Learn and use tips on how to relax.  Learn and use tips on how to control your breathing as told by your doctor. Try:  Breathing in (inhaling) through your nose for 1 second. Then, pucker your lips and breath out (exhale) through your lips for 2 seconds.  Putting one hand on your belly (abdomen). Breathe in slowly through your nose for 1 second. Your hand on your belly should move out. Pucker your lips and breathe out slowly through your lips. Your hand on your belly should move in as you breathe out.  Learn and use controlled coughing to clear thick spit from your lungs. The steps are: 1. Lean your head a little forward. 2. Breathe in deeply. 3. Try to hold your breath for 3 seconds. 4. Keep your mouth slightly open while coughing 2 times. 5. Spit any thick spit out into a tissue. 6. Rest and do the steps again 1 or 2 times as needed. GET HELP IF:  You cough up more thick spit than usual.  There is a change in the color or thickness of the spit.  It is harder to breathe than usual.  Your breathing is faster than usual. GET HELP RIGHT AWAY IF:  You have shortness of breath while resting.  You have shortness of breath that stops you from:  Being able to talk.  Doing normal activities.  You chest hurts for longer than 5 minutes.  Your skin color is more blue than usual.  Your pulse oximeter shows that you have low oxygen for longer than 5 minutes. MAKE SURE YOU:  Understand these instructions.  Will watch your condition.  Will get help right away if you are not doing well or get worse.   This information is not intended to replace advice given to you by your health care provider. Make sure you discuss any questions you have with your health care provider.   Document Released: 06/26/2007 Document Revised: 01/28/2014 Document  Reviewed: 09/03/2012 Elsevier Interactive Patient Education 2016 [employer].   Warfarin Coagulopathy Warfarin (Coumadin) coagulopathy refers to bleeding that may occur as a complication of the medicine warfarin. Warfarin is an oral blood thinner (anticoagulant). Warfarin is used for medical conditions where thinning of the blood is needed to prevent blood clots.  CAUSES Bleeding is the most common and most serious complication of warfarin. The amount of bleeding is related to the warfarin dose and length of treatment. In addition, bleeding complications can also occur due to:  Intentional or accidental warfarin overdose.  Underlying medical conditions.  Dietary changes.  Medicine, herbal, supplement, or alcohol interactions. SYMPTOMS Severe bleeding while on warfarin may occur from any tissue or organ. Symptoms of the blood being too thin may include:  Bleeding from the nose or gums.  Blood in  bowel movements which may appear as bright red, dark, or black tarry stools.  Blood in the urine which may appear as pink, red, or brown urine.  Unusual bruising or bruising easily.  A cut that does not stop bleeding within 10 minutes.  Vomiting blood or continuous nausea for more than 1 day.  Coughing up blood.  Broken blood vessels in your eye (subconjunctival hemorrhage).  Abdominal or back pain with or without flank bruising.  Sudden, severe headache.  Sudden weakness or numbness of the face, arm, or leg, especially on one side of the body.  Sudden confusion.  Trouble speaking (aphasia) or understanding.  Sudden trouble seeing in one or both eyes.  Sudden trouble walking.  Dizziness.  Loss of balance or coordination.  Vaginal bleeding.  Swelling or pain at an injection site.  Superficial fat tissue death (necrosis) which may cause skin scarring. This is more common in women and may first present as pain in the waist, thighs, or buttocks. HOME CARE  INSTRUCTIONS  Always contact your health care provider of any concerns or signs of possible warfarin coagulopathy as soon as possible.  Take warfarin exactly as directed by your health care provider. It is recommended that you take your warfarin dose at the same time of the day. If you have been told to stop taking warfarin, do not resume taking warfarin until directed to do so by your health care provider. Follow your health care provider's instructions if you accidentally take an extra dose or miss a dose of warfarin. It is very important to take warfarin as directed since bleeding or blood clots could result in chronic or permanent injury, pain, or disability.  Keep all follow-up appointments with your health care provider as directed. It is very important to keep your appointments. Not keeping appointments could result in a chronic or permanent injury, pain, or disability because warfarin is a medicine that requires close monitoring.  While taking warfarin, you will need to have regular blood tests to measure your blood clotting time. These blood tests usually include both the prothrombin time (PT) and International Normalized Ratio (INR) tests. The PT and INR results allow your health care provider to adjust your dose of warfarin. The dose can change for many reasons. It is critically important that you have your PT and INR levels drawn exactly as directed. Your warfarin dose may stay the same or change depending on what the PT and INR results are. Be sure to follow up with your health care provider regarding your PT and INR test results and what your warfarin dosage should be.  Many medicines can interfere with warfarin and affect the PT and INR results. You must tell your health care provider about any and all medicines you take. This includes all vitamins and supplements. Ask your health care provider before taking these. Prescription and over-the-counter medicine consistency is critical to  warfarin management. It is important that potential interactions are checked before you start a new medicine. Be especially cautious with aspirin and anti-inflammatory medicines. Ask your health care provider before taking these. Medicines such as antibiotics and acid-reducing medicine can interact with warfarin and can cause an increased warfarin effect. Warfarin can also interfere with the effectiveness of medicines you are taking. Do not take or discontinue any prescribed or over-the-counter medicine except on the advice of your health care provider or pharmacist.  Some vitamins, supplements, and herbal products interfere with the effectiveness of warfarin. Vitamin E may increase the anticoagulant effects  of warfarin. Vitamin K can cause warfarin to be less effective. Do not take or discontinue any vitamin, supplement, or herbal product except on the advice of your health care provider or pharmacist.  Eat what you normally eat and keep the vitamin K content of your diet consistent. Avoid major changes in your diet, or notify your health care provider before changing your diet. Suddenly getting a lot more vitamin K could cause your blood to clot too quickly. A sudden decrease in vitamin K intake could cause your blood to clot too slowly. These changes in vitamin K intake could lead to dangerous blood clotsor to bleeding. To keep your vitamin K intake consistent, you must be aware of which foods contain moderate or high amounts of vitamin K. Some foods that are high in vitamin K include spinach, kale, broccoli, cabbage, greens, Brussels sprouts, asparagus, bok choy, coleslaw, and parsley. If you drink green tea, drink the same amount each day. Arrange a visit with a dietitian to answer your questions.  If you have a loss of appetite or get the stomach flu (viral gastroenteritis), talk to your health care provider as soon as possible. A decrease in your normal vitamin K intake can make you more sensitive to  your usual dose of warfarin.  Some medical conditions may increase your risk for bleeding while you are taking warfarin. A fever, diarrhea lasting more than a day, worsening heart failure, or worsening liver function are some medical conditions that could affect warfarin. Contact your health care provider if you have any of these medical conditions.  Be careful not to cut yourself when using sharp objects or while shaving.  Alcohol can change the body's ability to handle warfarin. It is best to avoid alcoholic drinks or consume only very small amounts while taking warfarin. Notify your health care provider if you change your alcohol intake. A sudden increase in alcohol use can increase your risk of bleeding. Chronic alcohol use can cause warfarin to be less effective.  Limit physical activities or sports that could result in a fall or cause injury.  Do not use warfarin if you are pregnant.  Inform all your health care providers and your dentist that you take warfarin.  Inform all health care providers if you are taking warfarin and aspirin or platelet inhibitor medicines such as clopidogrel, ticagrelor, or prasugrel. Use of these medicines in addition to warfarin can increase your risk of bleeding or death. Taking these medicines together should only be done under the direct care of your health care providers. SEEK IMMEDIATE MEDICAL CARE IF:  You cough up blood.  You have dark or black stools or there is bright red blood coming from your rectum.  You vomit blood or have nausea for more than 1 day.  You have blood in the urine or pink-colored urine.  You have unusual bruising or have increased bruising.  You have bleeding from the nose or gums that does not stop quickly.  You have a cut that does not stop bleeding within 2-3 minutes.  You have sudden weakness or numbness of the face, arm, or leg, especially on one side of the body.  You have sudden confusion.  You have trouble  speaking (aphasia) or understanding.  You have sudden trouble seeing in one or both eyes.  You have sudden trouble walking.  You have dizziness.  You have a loss of balance or coordination.  You have a sudden, severe headache.  You have a serious fall or  head injury, even if you are not bleeding.  You have swelling or pain at an injection site.  You have unexplained tenderness or pain in the abdomen, back, waist, thighs, or buttocks. Any of these symptoms may represent a serious problem that is an emergency. Do not wait to see if the symptoms will go away. Get medical help right away. Call your local emergency services (911 in [place] Do not drive yourself to the hospital.   This information is not intended to replace advice given to you by your health care provider. Make sure you discuss any questions you have with your health care provider.   Document Released: 12/16/2005 Document Revised: 05/24/2014 Document Reviewed: 06/18/2011 Elsevier Interactive Patient Education [employer].

## 2015-08-27 NOTE — Progress Notes (Signed)
Referred to CSW today for personal care services- CSW reviewed chart and noted plans for dc to home with [facility]. RNCM made aware of order for personal care and to assess/assist with eligibility etermination and arrangement. CSW to sign off- please contact us if SW needs arise. Eduard Clos, MSW, LCSW [phone] Weekend coverage

## 2015-08-27 NOTE — Evaluation (Signed)
Physical Therapy Treatment Patient Details Name: Troy [name] MRN: [id number] DOB: March 11, 1940 Today's Date: 08/27/2015    History of Present Illness Pt admitted with worsening shortness of breath and cough. 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  He wants to return to his home as he feels he is close to baseline for mobility but will benefit from Home health PT and aide to help him get stronger and improve safety.   Follow Up Recommendations  Home health PT;Other (comment) (home health aide )     Equipment Recommendations  None recommended by PT    Recommendations for Other Services OT consult     Precautions / Restrictions Precautions Precautions: Fall;ICD/Pacemaker Restrictions Weight Bearing Restrictions: No    Mobility  Bed Mobility Overal bed mobility: Modified Independent             General bed mobility comments:  (moves quickly and impulsively )  Transfers Overall transfer level: Modified independent Equipment used: Straight cane             General transfer comment: moves quickly, reaches with hands for support on furniture or cane   Ambulation/Gait Ambulation/Gait assistance: Min assist Ambulation Distance (Feet): 100 Feet Assistive device: Straight cane Gait Pattern/deviations: Wide base of support Gait velocity: tends to move quickly, but stops when he needs to rest "i have to take my time "    General Gait Details: appears to have decrased control of right arm in gait and tends to lose balance but corrects it with straight cane on turns    Stairs Stairs: Yes Stairs assistance: Min assist Stair Management: One rail Left;Forwards;With  cane   General stair comments: 2  Wheelchair Mobility    Modified Rankin (Stroke Patients Only)       Balance Overall balance assessment: Modified Independent (needs cane for support )                                  Cognition Arousal/Alertness: Awake/alert Behavior During Therapy: Impulsive (pt gets up on his own without regard to safety ) Overall Cognitive Status: Within Functional Limits for tasks assessed                      Exercises      General Comments General comments (skin integrity, edema, etc.): pt has impaired focus direction of right eye, poor posture and impulsivity with movment is apparent, but he states this is the way he usually is and he does OK       Pertinent Vitals/Pain Pain Assessment: Faces Faces Pain Scale: Hurts even more Pain Location: abdomen Pain Descriptors / Indicators: Burning Pain Intervention(s): Limited activity within patient's tolerance;Patient requesting pain meds-RN notified    Home Living Family/patient expects to be discharged to:: Private residence Living Arrangements: Alone Available Help at Discharge: Family Type of Home: Apartment Home Access: [place]: One level Home Equipment: Environmental consultant - 2 wheels;Cane - single point Additional Comments: pt prefers to use a straight cane  has O2 at home     Prior Function Level of Independence: Independent with assistive device(s)  PT Goals (current goals can now be found in the care plan section) Acute Rehab PT Goals Patient Stated Goal: to get some help when he first goes home  PT Goal Formulation: With patient Time For Goal Achievement: 09/10/15 Potential to Achieve Goals: Good    Frequency  Min 3X/week    PT Plan      Co-evaluation             End of Session Equipment Utilized During Treatment: Gait belt Activity Tolerance: Other (comment) (O2 sats down to 87% when walking on RA  improved with rest) Patient left: in  bed;with bed alarm set     Time: [id number] PT Time Calculation (min) (ACUTE ONLY): 25 min  Charges:                       G Codes:     Teresa K. Owens Shark, PT  Norwood Levo 08/27/2015, 9:38 AM

## 2015-08-27 NOTE — Progress Notes (Signed)
[facility] for Warfarin Indication: atrial fibrillation  Allergies  Allergen Reactions  . Benadryl [Diphenhydramine Hcl] Nausea And Vomiting   Patient Measurements: Height: 5\' 8"  (172.7 cm) Weight: 162 lb 3.2 oz (73.6 kg) IBW/kg (Calculated) : 68.4   Vital Signs: Temp: 97.9 F (36.6 C) (08/06 0456) Temp Source: Oral (08/06 0456) BP: 141/67 (08/06 0456) Pulse Rate: 113 (08/06 0456)  Labs:  Recent Labs  08/24/15 1324 08/25/15 0556 08/25/15 0859 08/26/15 0459 08/27/15 0541  HGB 13.5 12.5*  --   --   --   HCT 40.4 37.0*  --   --   --   PLT 123* 136*  --   --   --   LABPROT 31.8*  --  36.1* 41.7* 42.5*  INR 3.00  --  3.52 4.21* 4.31*  CREATININE 1.79* 1.59*  --   --   --    Estimated Creatinine Clearance: 38.8 mL/min (by C-G formula based on SCr of 1.59 mg/dL).  Medical History: Past Medical History:  Diagnosis Date  . Atrial fibrillation ([place])   . CAD (coronary artery disease) 1996   status post PCI of the RCA   . Congestive heart failure, unspecified   . COPD (chronic obstructive pulmonary disease) ([facility])    Pt on home O2 at night and PRN, unsure of date of diagnosis.  . Diabetes mellitus, type 2 ([place])   . DJD (degenerative joint disease)   . GERD (gastroesophageal reflux disease)   . Gout   . HTN (hypertension)   . Ischemic dilated cardiomyopathy   . Nephrolithiasis   . Other primary cardiomyopathies   . Personal history of DVT (deep vein thrombosis)   . Prostatitis    Medications:  Scheduled:  . alfuzosin  10 mg Oral Q breakfast  . allopurinol  100 mg Oral Daily  . antiseptic oral rinse  7 mL Mouth Rinse q12n4p  . atorvastatin  10 mg Oral QHS  . azithromycin  500 mg Oral Q24H  . carvedilol  12.5 mg Oral BID WC  . cefTRIAXone (ROCEPHIN)  IV  1 g Intravenous Q24H  . cetirizine  10 mg Oral Daily  . chlorhexidine  15 mL Mouth Rinse BID  . digoxin  0.0625 mg Oral Daily  . fluticasone  2 spray Each Nare Daily  .  furosemide  40 mg Oral BID  . guaiFENesin  600 mg Oral BID  . hydrALAZINE  25 mg Oral TID  . ipratropium-albuterol  3 mL Nebulization QID  . losartan  50 mg Oral Daily  . methylPREDNISolone (SOLU-MEDROL) injection  40 mg Intravenous Q8H  . pantoprazole  40 mg Oral Daily  . potassium chloride  20 mEq Oral Daily  . sodium chloride flush  3 mL Intravenous Q12H  . Warfarin - Pharmacist Dosing Inpatient   Does not apply q1800   Assessment: 38 yoM with hx of Afib on chronic Warfarin, to ED with cough and SOB.  Resume Warfarin dosed per Pharmacy, admit INR 3.0 on home dose 3mg  Wed,Sat; 2mg  other days with last dose 8/2 at 1730. 08/27/2015:  INR supratherapeutic & still trending up (4.31)  H/H slightly decreased; Plts stable  No bleeding reported  Acute illness and Zithromax may potentiate effects of warfarin resulting in increased INR  Regular diet - eating 100%  Goal of Therapy:  INR 2-3   Plan:   No warfarin today  Monitor for signs/symptoms of bleeding  Daily PT/INR  Netta Cedars, PharmD, BCPS Pager: [phone] 08/27/2015,  8:30 AM

## 2015-08-29 LAB — CULTURE, BLOOD (ROUTINE X 2)
Culture: NO GROWTH
Culture: NO GROWTH

## 2015-08-31 ENCOUNTER — Telehealth: Payer: Self-pay | Admitting: *Deleted

## 2015-08-31 NOTE — Telephone Encounter (Signed)
Pt states he agrees to have [place] come to his office & have INR checked

## 2015-08-31 NOTE — Telephone Encounter (Signed)
Spoke with Verdis Frederickson RN with [facility] & she stated the pt declined for them to visit with him today because he states he is too busy for them to interrupt his day.  She stated that he agreed for them to visit on 09/01/15 at noon & she would call us them.  Also, I called the pt to remind him that if he does not allow Home Health to visit that he would have to be seen in the office.

## 2015-09-01 ENCOUNTER — Ambulatory Visit (INDEPENDENT_AMBULATORY_CARE_PROVIDER_SITE_OTHER): Payer: Medicare HMO | Admitting: Cardiology

## 2015-09-01 DIAGNOSIS — I480 Paroxysmal atrial fibrillation: Secondary | ICD-10-CM

## 2015-09-01 DIAGNOSIS — Z5181 Encounter for therapeutic drug level monitoring: Secondary | ICD-10-CM

## 2015-09-01 LAB — POCT INR: INR: 2.3

## 2015-09-04 ENCOUNTER — Ambulatory Visit: Payer: Medicare HMO | Admitting: Interventional Cardiology

## 2015-09-04 DIAGNOSIS — R0989 Other specified symptoms and signs involving the circulatory and respiratory systems: Secondary | ICD-10-CM

## 2015-09-06 ENCOUNTER — Encounter: Payer: Self-pay | Admitting: Interventional Cardiology

## 2015-09-08 ENCOUNTER — Ambulatory Visit (INDEPENDENT_AMBULATORY_CARE_PROVIDER_SITE_OTHER): Payer: Medicare HMO | Admitting: Pharmacist

## 2015-09-08 DIAGNOSIS — Z5181 Encounter for therapeutic drug level monitoring: Secondary | ICD-10-CM

## 2015-09-08 DIAGNOSIS — I480 Paroxysmal atrial fibrillation: Secondary | ICD-10-CM

## 2015-09-08 LAB — POCT INR: INR: 3.3

## 2015-09-11 ENCOUNTER — Inpatient Hospital Stay (HOSPITAL_COMMUNITY)
Admission: EM | Admit: 2015-09-11 | Discharge: 2015-09-13 | DRG: 378 | Disposition: A | Discharge status: Home / Self Care | Payer: Medicare HMO | Attending: Internal Medicine | Admitting: Internal Medicine

## 2015-09-11 ENCOUNTER — Encounter (HOSPITAL_COMMUNITY): Payer: Self-pay

## 2015-09-11 DIAGNOSIS — N183 Chronic kidney disease, stage 3 unspecified: Secondary | ICD-10-CM | POA: Diagnosis present

## 2015-09-11 DIAGNOSIS — K922 Gastrointestinal hemorrhage, unspecified: Secondary | ICD-10-CM

## 2015-09-11 DIAGNOSIS — Z95810 Presence of automatic (implantable) cardiac defibrillator: Secondary | ICD-10-CM | POA: Diagnosis not present

## 2015-09-11 DIAGNOSIS — Z888 Allergy status to other drugs, medicaments and biological substances status: Secondary | ICD-10-CM | POA: Diagnosis not present

## 2015-09-11 DIAGNOSIS — J449 Chronic obstructive pulmonary disease, unspecified: Secondary | ICD-10-CM | POA: Diagnosis present

## 2015-09-11 DIAGNOSIS — Z7901 Long term (current) use of anticoagulants: Secondary | ICD-10-CM | POA: Diagnosis not present

## 2015-09-11 DIAGNOSIS — I482 Chronic atrial fibrillation, unspecified: Secondary | ICD-10-CM | POA: Diagnosis present

## 2015-09-11 DIAGNOSIS — E1122 Type 2 diabetes mellitus with diabetic chronic kidney disease: Secondary | ICD-10-CM | POA: Diagnosis present

## 2015-09-11 DIAGNOSIS — I5022 Chronic systolic (congestive) heart failure: Secondary | ICD-10-CM | POA: Diagnosis present

## 2015-09-11 DIAGNOSIS — I130 Hypertensive heart and chronic kidney disease with heart failure and stage 1 through stage 4 chronic kidney disease, or unspecified chronic kidney disease: Secondary | ICD-10-CM | POA: Diagnosis present

## 2015-09-11 DIAGNOSIS — Z79899 Other long term (current) drug therapy: Secondary | ICD-10-CM | POA: Diagnosis not present

## 2015-09-11 DIAGNOSIS — I255 Ischemic cardiomyopathy: Secondary | ICD-10-CM | POA: Diagnosis present

## 2015-09-11 DIAGNOSIS — Z86718 Personal history of other venous thrombosis and embolism: Secondary | ICD-10-CM

## 2015-09-11 DIAGNOSIS — Z87891 Personal history of nicotine dependence: Secondary | ICD-10-CM

## 2015-09-11 DIAGNOSIS — I2510 Atherosclerotic heart disease of native coronary artery without angina pectoris: Secondary | ICD-10-CM | POA: Diagnosis present

## 2015-09-11 DIAGNOSIS — K5731 Diverticulosis of large intestine without perforation or abscess with bleeding: Principal | ICD-10-CM | POA: Diagnosis present

## 2015-09-11 DIAGNOSIS — Z9981 Dependence on supplemental oxygen: Secondary | ICD-10-CM

## 2015-09-11 DIAGNOSIS — K625 Hemorrhage of anus and rectum: Secondary | ICD-10-CM | POA: Diagnosis present

## 2015-09-11 DIAGNOSIS — Z955 Presence of coronary angioplasty implant and graft: Secondary | ICD-10-CM

## 2015-09-11 LAB — COMPREHENSIVE METABOLIC PANEL
ALBUMIN: 3.2 g/dL — AB (ref 3.5–5.0)
ALK PHOS: 83 U/L (ref 38–126)
ALT: 23 U/L (ref 17–63)
AST: 17 U/L (ref 15–41)
Anion gap: 4 — ABNORMAL LOW (ref 5–15)
BUN: 23 mg/dL — ABNORMAL HIGH (ref 6–20)
CALCIUM: 9.2 mg/dL (ref 8.9–10.3)
CO2: 35 mmol/L — AB (ref 22–32)
CREATININE: 1.37 mg/dL — AB (ref 0.61–1.24)
Chloride: 102 mmol/L (ref 101–111)
GFR calc Af Amer: 57 mL/min — ABNORMAL LOW (ref >60)
GFR calc non Af Amer: 49 mL/min — ABNORMAL LOW (ref >60)
GLUCOSE: 103 mg/dL — AB (ref 65–99)
Potassium: 4.1 mmol/L (ref 3.5–5.1)
SODIUM: 141 mmol/L (ref 135–145)
Total Bilirubin: 0.8 mg/dL (ref 0.3–1.2)
Total Protein: 6.8 g/dL (ref 6.5–8.1)

## 2015-09-11 LAB — CBC
HCT: 34.4 % — ABNORMAL LOW (ref 39.0–52.0)
Hemoglobin: 11.8 g/dL — ABNORMAL LOW (ref 13.0–17.0)
MCH: 34.2 pg — AB (ref 26.0–34.0)
MCHC: 34.3 g/dL (ref 30.0–36.0)
MCV: 99.7 fL (ref 78.0–100.0)
PLATELETS: 128 10*3/uL — AB (ref 150–400)
RBC: 3.45 MIL/uL — ABNORMAL LOW (ref 4.22–5.81)
RDW: 14.3 % (ref 11.5–15.5)
WBC: 7.3 10*3/uL (ref 4.0–10.5)

## 2015-09-11 LAB — TYPE AND SCREEN
ABO/RH(D): O POS
ANTIBODY SCREEN: NEGATIVE
DAT, IgG: NEGATIVE

## 2015-09-11 LAB — PROTIME-INR
INR: 2.69
Prothrombin Time: 29.2 seconds — ABNORMAL HIGH (ref 11.4–15.2)

## 2015-09-11 LAB — POC OCCULT BLOOD, ED: FECAL OCCULT BLD: POSITIVE — AB

## 2015-09-11 MED ORDER — HYDRALAZINE HCL 25 MG PO TABS
25.0000 mg | ORAL_TABLET | Freq: Three times a day (TID) | ORAL | Status: DC
  Administered 2015-09-11 – 2015-09-12 (×3): 25 mg via ORAL
  Filled 2015-09-11 (×4): qty 1

## 2015-09-11 MED ORDER — SODIUM CHLORIDE 0.9% FLUSH
3.0000 mL | Freq: Two times a day (BID) | INTRAVENOUS | Status: DC
  Administered 2015-09-11 – 2015-09-12 (×3): 3 mL via INTRAVENOUS

## 2015-09-11 MED ORDER — FUROSEMIDE 40 MG PO TABS
40.0000 mg | ORAL_TABLET | Freq: Two times a day (BID) | ORAL | Status: DC
  Administered 2015-09-11 – 2015-09-13 (×4): 40 mg via ORAL
  Filled 2015-09-11 (×4): qty 1

## 2015-09-11 MED ORDER — ATORVASTATIN CALCIUM 10 MG PO TABS
10.0000 mg | ORAL_TABLET | Freq: Every day | ORAL | Status: DC
  Administered 2015-09-11 – 2015-09-12 (×2): 10 mg via ORAL
  Filled 2015-09-11 (×2): qty 1

## 2015-09-11 MED ORDER — DIGOXIN 125 MCG PO TABS
0.0625 mg | ORAL_TABLET | Freq: Every day | ORAL | Status: DC
  Administered 2015-09-13: 0.0625 mg via ORAL
  Filled 2015-09-11: qty 0.5

## 2015-09-11 MED ORDER — TRAMADOL HCL 50 MG PO TABS: 50.0000 mg | ORAL_TABLET | Freq: Four times a day (QID) | ORAL | Status: DC | PRN

## 2015-09-11 MED ORDER — ALBUTEROL SULFATE (2.5 MG/3ML) 0.083% IN NEBU
1.5000 mL | INHALATION_SOLUTION | Freq: Four times a day (QID) | RESPIRATORY_TRACT | Status: DC
  Administered 2015-09-11 – 2015-09-12 (×4): 1.5 mL via RESPIRATORY_TRACT
  Filled 2015-09-11 (×4): qty 3

## 2015-09-11 MED ORDER — ONDANSETRON HCL 4 MG PO TABS: 4.0000 mg | ORAL_TABLET | Freq: Four times a day (QID) | ORAL | Status: DC | PRN

## 2015-09-11 MED ORDER — SODIUM CHLORIDE 0.9% FLUSH: 3.0000 mL | INTRAVENOUS | Status: DC | PRN

## 2015-09-11 MED ORDER — ONDANSETRON HCL 4 MG/2ML IJ SOLN: 4.0000 mg | Freq: Four times a day (QID) | INTRAMUSCULAR | Status: DC | PRN

## 2015-09-11 MED ORDER — SODIUM CHLORIDE 0.9 % IV SOLN
250.0000 mL | INTRAVENOUS | Status: DC | PRN
  Administered 2015-09-11: 250 mL via INTRAVENOUS

## 2015-09-11 MED ORDER — ACETAMINOPHEN 650 MG RE SUPP
650.0000 mg | Freq: Four times a day (QID) | RECTAL | Status: DC | PRN
Start: 2015-09-11 — End: 2015-09-13

## 2015-09-11 MED ORDER — ALLOPURINOL 100 MG PO TABS
100.0000 mg | ORAL_TABLET | Freq: Every day | ORAL | Status: DC
  Administered 2015-09-12 – 2015-09-13 (×2): 100 mg via ORAL
  Filled 2015-09-11 (×3): qty 1

## 2015-09-11 MED ORDER — CARVEDILOL 12.5 MG PO TABS
12.5000 mg | ORAL_TABLET | Freq: Two times a day (BID) | ORAL | Status: DC
  Administered 2015-09-11 – 2015-09-13 (×4): 12.5 mg via ORAL
  Filled 2015-09-11 (×4): qty 1

## 2015-09-11 MED ORDER — LOSARTAN POTASSIUM 50 MG PO TABS
50.0000 mg | ORAL_TABLET | Freq: Every day | ORAL | Status: DC
  Administered 2015-09-12 – 2015-09-13 (×2): 50 mg via ORAL
  Filled 2015-09-11 (×2): qty 1

## 2015-09-11 MED ORDER — POTASSIUM CHLORIDE CRYS ER 10 MEQ PO TBCR
10.0000 meq | EXTENDED_RELEASE_TABLET | Freq: Two times a day (BID) | ORAL | Status: DC
  Administered 2015-09-11 – 2015-09-13 (×4): 10 meq via ORAL
  Filled 2015-09-11 (×8): qty 1

## 2015-09-11 MED ORDER — ALFUZOSIN HCL ER 10 MG PO TB24
10.0000 mg | ORAL_TABLET | Freq: Every day | ORAL | Status: DC
  Administered 2015-09-12 – 2015-09-13 (×2): 10 mg via ORAL
  Filled 2015-09-11 (×2): qty 1

## 2015-09-11 MED ORDER — ACETAMINOPHEN 325 MG PO TABS: 650.0000 mg | ORAL_TABLET | Freq: Four times a day (QID) | ORAL | Status: DC | PRN

## 2015-09-11 NOTE — Progress Notes (Signed)
ANTICOAGULATION CONSULT NOTE - Follow Up Consult  Pharmacy Consult for warfarin Indication: hx atrial fibrillation  Allergies  Allergen Reactions  . Benadryl [Diphenhydramine Hcl] Nausea And Vomiting    Patient Measurements: Height: 5\' 6"  (167.6 cm) Weight: 150 lb (68 kg) IBW/kg (Calculated) : 63.8 Heparin Dosing Weight:   Vital Signs: Temp: 98.2 F (36.8 C) (08/21 1800) Temp Source: Oral (08/21 1800) BP: 151/76 (08/21 1800) Pulse Rate: 101 (08/21 1800)  Labs:  Recent Labs  09/11/15 1243  HGB 11.8*  HCT 34.4*  PLT 128*  LABPROT 29.2*  INR 2.69  CREATININE 1.37*    Estimated Creatinine Clearance: 42 mL/min (by C-G formula based on SCr of 1.37 mg/dL).   Medications:  Home warfarin regimen: 2 mg daily except 3 mg on Wed and Sat (LD 8/20)  Assessment: Patient is a 75 y.o M on warfarin PTA for hx Afib, presented to the ED on 8/21 with c/o bright red blood per rectum. INR therapeutic at 2.69 on admission.  Per Dr. Aileen Fass, to hold warfarin for now until further notice.  Goal of Therapy:  INR 2-3 Monitor platelets by anticoagulation protocol: Yes   Plan:  - hold warfarin for GIB -  Pharmacy will follow patient along with you.  Please advise pharmacy if/when you want [place] to resume warfarin back for him  Cherl Gorney P 09/11/2015,6:20 PM

## 2015-09-11 NOTE — ED Triage Notes (Addendum)
Pt here with rectal bleeding x today.  Pt is on coumadin.  Mixed in stool.  Denies hemorrhoids.  No abdominal pain.  No n/v.  Pt states recently had pt/inr checked and was too thin.  Pt held coumadin x 1 day.

## 2015-09-11 NOTE — ED Provider Notes (Signed)
[facility] DEPT Provider Note   CSN: [id number] Arrival date & time: 09/11/15  1202     History   Chief Complaint Chief Complaint  Patient presents with  . Rectal Bleeding    HPI Troy [name] is a 75 y.o. male with a history of atrial fibrillation on Coumadin. His INR was high recently and he held his Coumadin for one day. He has been having rectal bleeding since this morning. He describes it as bright red blood mixed with stool. He has had multiple episodes. He does not have any perianal hemorrhoids or other lesions that he is aware of. He denies abdominal pain, nausea and vomiting. He denies chest pain and shortness of breath.  HPI  Past Medical History:  Diagnosis Date  . Atrial fibrillation ([place])   . CAD (coronary artery disease) 1996   status post PCI of the RCA   . Congestive heart failure, unspecified   . COPD (chronic obstructive pulmonary disease) (HCC)    Pt on home O2 at night and PRN, unsure of date of diagnosis.  . Diabetes mellitus, type 2 ([place])   . DJD (degenerative joint disease)   . GERD (gastroesophageal reflux disease)   . Gout   . HTN (hypertension)   . Ischemic dilated cardiomyopathy   . Nephrolithiasis   . Other primary cardiomyopathies   . Personal history of DVT (deep vein thrombosis)   . Prostatitis     Patient Active Problem List   Diagnosis Date Noted  . Community acquired pneumonia 08/24/2015  . Acute exacerbation of chronic obstructive pulmonary disease (COPD) ([place]) 03/05/2015  . Acute renal failure superimposed on stage 3 chronic kidney disease ([place]) 03/05/2015  . COPD with acute exacerbation ([place]) 03/05/2015  . Renal failure (ARF), acute on chronic (HCC)   . Stomach ache 01/18/2015  . COPD exacerbation ([place])   . AKI (acute kidney injury) ([place]) 12/26/2014  . BPH (benign prostatic hyperplasia) 12/25/2014  . FTT (failure to thrive) in adult 12/25/2014  . Encounter for therapeutic drug monitoring 08/05/2014  . Pulmonary nodules  12/23/2013  . Hemoptysis   . Dyspnea   . Acute on chronic systolic CHF (congestive heart failure) ([place])   . Chronic kidney disease, stage 3   . Coronary artery disease involving native coronary artery of native heart without angina pectoris   . Paroxysmal atrial fibrillation (HCC)   . Frequent PVCs   . Hypoxia   . Acute on chronic respiratory failure with hypoxia ([place])   . Chronic obstructive pulmonary disease with acute exacerbation ([place])   . COPD (chronic obstructive pulmonary disease) ([place]) 06/16/2013  . Lipoma of neck 09/11/2011  . 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 Coronary artery disease 01/30/2011  . Atrial fibrillation ([place]) 01/30/2011  . Chronic systolic CHF (congestive heart failure) ([place])   . Other primary cardiomyopathies   . Diverticulosis of colon with hemorrhage 08/28/2010    Past Surgical History:  Procedure Laterality Date  . BACK SURGERY    . CARDIAC DEFIBRILLATOR PLACEMENT    . CORONARY STENT PLACEMENT    . PACEMAKER INSERTION         Home Medications    Prior to Admission medications   Medication Sig Start Date End Date Taking? Authorizing Provider  albuterol (ACCUNEB) 1.25 MG/3ML nebulizer solution Take 3 mLs by nebulization 4 (four) times daily. 08/21/15   Historical Provider, MD  alfuzosin (UROXATRAL) 10 MG 24 hr tablet Take 10 mg by mouth daily with breakfast.  Historical Provider, MD  allopurinol (ZYLOPRIM) 100 MG tablet Take 100 mg by mouth daily.    Historical Provider, MD  atorvastatin (LIPITOR) 10 MG tablet Take 1 tablet (10 mg total) by mouth at bedtime. 08/03/14   Jettie Booze, MD  carvedilol (COREG) 12.5 MG tablet Take 1 tablet (12.5 mg total) by mouth 2 (two) times daily with a meal. 03/03/15   Jettie Booze, MD  dextromethorphan (DELSYM) 30 MG/5ML liquid Take 5 mLs (30 mg total) by mouth 2 (two) times daily as needed for cough. 08/27/15   Clanford Marisa Hua, MD  digoxin (LANOXIN) 0.125 MG  tablet Take 0.5 tablets (0.0625 mg total) by mouth daily. 06/23/15   Jettie Booze, MD  fluticasone (FLONASE) 50 MCG/ACT nasal spray Place 2 sprays into both nostrils daily as needed for allergies.  04/07/13   Historical Provider, MD  furosemide (LASIX) 40 MG tablet Take 2 tablets (80 mg total) by mouth daily. with food Patient taking differently: Take 40 mg by mouth 2 (two) times daily. with food 08/17/15   Jettie Booze, MD  hydrALAZINE (APRESOLINE) 25 MG tablet Take 1 tablet (25 mg total) by mouth 3 (three) times daily. 05/31/15   Jettie Booze, MD  loratadine (CLARITIN) 10 MG tablet Take 10 mg by mouth daily. For allergies    Historical Provider, MD  losartan (COZAAR) 50 MG tablet Take 50 mg by mouth daily. 06/08/15   Historical Provider, MD  mometasone-formoterol (DULERA) 100-5 MCG/ACT AERO Inhale 2 puffs into the lungs 2 (two) times daily. Patient not taking: Reported on 08/24/2015 11/26/13   Kinnie Feil, MD  potassium chloride (K-DUR) 10 MEQ tablet Take 2 tablets (20 mEq total) by mouth daily. Patient taking differently: Take 10 mEq by mouth 2 (two) times daily.  08/14/15   Jettie Booze, MD  PROAIR HFA 108 [phone] BASE) MCG/ACT inhaler INHALE 2 PUFFS EVERY 4 HOURS AS NEEDED FOR WHEEZING OR SHORTNESS OF BREATH. 09/05/14   Collene Gobble, MD  warfarin (COUMADIN) 1 MG tablet Take 2 tabs daily except 3 tabs on Wednesday & Saturday or as directed by Coumadin Clinic 08/30/15   Clanford Marisa Hua, MD    Family History Family History  Problem Relation Age of Onset  . Heart disease Father   . Cancer Father     LUNG  . Colon cancer Mother   . Stroke Paternal Uncle   . Heart attack Neg Hx   . Hyperlipidemia Neg Hx   . Hypertension Neg Hx     Social History Social History  Substance Use Topics  . Smoking status: Former Smoker    Packs/day: 4.00    Years: 50.00    Types: Cigarettes    Quit date: 01/22/1980  . Smokeless tobacco: Never Used  . Alcohol use No     Comment:  denies     Allergies   Benadryl [diphenhydramine hcl]   Review of Systems Review of Systems  All other systems reviewed and are negative.    Physical Exam Updated Vital Signs BP 140/73 (BP Location: Left Arm)   Pulse 102   Temp 97.9 F (36.6 C) (Oral)   Resp 18   Ht 5\' 6"  (1.676 m)   Wt 150 lb (68 kg)   SpO2 97%   BMI 24.21 kg/m   Physical Exam  General: Well-developed, well-nourished male in no acute distress; appearance consistent with age of record HENT: normocephalic; atraumatic Eyes: pupils equal, round and reactive to light; left lateral  strabismus Neck: supple Heart: regular rate and rhythm Lungs: clear to auscultation bilaterally Abdomen: soft; nondistended; nontender; no masses or hepatosplenomegaly; bowel sounds present Rectal: Perianal irritation without hemorrhoids or other bleeding lesions; stool on examining glove brown and heme positive Extremities: No deformity; full range of motion; pulses normal Neurologic: Awake, alert and oriented; motor function intact in all extremities and symmetric; no facial droop Skin: Warm and dry Psychiatric: Normal mood and affect   Procedures (including critical care time)  Final Clinical Impressions(s) / ED Diagnoses   Nursing notes and vitals signs, including pulse oximetry, reviewed.  Summary of this visit's results, reviewed by myself:  Labs:  Results for orders placed or performed during the hospital encounter of 09/11/15 (from the past 24 hour(s))  Comprehensive metabolic panel     Status: Abnormal   Collection Time: 09/11/15 12:43 PM  Result Value Ref Range   Sodium 141 135 - 145 mmol/L   Potassium 4.1 3.5 - 5.1 mmol/L   Chloride 102 101 - 111 mmol/L   CO2 35 (H) 22 - 32 mmol/L   Glucose, Bld 103 (H) 65 - 99 mg/dL   BUN 23 (H) 6 - 20 mg/dL   Creatinine, Ser 1.37 (H) 0.61 - 1.24 mg/dL   Calcium 9.2 8.9 - 10.3 mg/dL   Total Protein 6.8 6.5 - 8.1 g/dL   Albumin 3.2 (L) 3.5 - 5.0 g/dL   AST 17 15 - 41  U/L   ALT 23 17 - 63 U/L   Alkaline Phosphatase 83 38 - 126 U/L   Total Bilirubin 0.8 0.3 - 1.2 mg/dL   GFR calc non Af Amer 49 (L) >60 mL/min   GFR calc Af Amer 57 (L) >60 mL/min   Anion gap 4 (L) 5 - 15  CBC     Status: Abnormal   Collection Time: 09/11/15 12:43 PM  Result Value Ref Range   WBC 7.3 4.0 - 10.5 K/uL   RBC 3.45 (L) 4.22 - 5.81 MIL/uL   Hemoglobin 11.8 (L) 13.0 - 17.0 g/dL   HCT 34.4 (L) 39.0 - 52.0 %   MCV 99.7 78.0 - 100.0 fL   MCH 34.2 (H) 26.0 - 34.0 pg   MCHC 34.3 30.0 - 36.0 g/dL   RDW 14.3 11.5 - 15.5 %   Platelets 128 (L) 150 - 400 K/uL  Protime-INR     Status: Abnormal   Collection Time: 09/11/15 12:43 PM  Result Value Ref Range   Prothrombin Time 29.2 (H) 11.4 - 15.2 seconds   INR 2.69   Type and screen [place]     Status: None   Collection Time: 09/11/15 12:43 PM  Result Value Ref Range   ABO/RH(D) O POS    Antibody Screen NEG    Sample Expiration 09/14/2015    DAT, IgG NEG   POC occult blood, ED     Status: Abnormal   Collection Time: 09/11/15  3:37 PM  Result Value Ref Range   Fecal Occult Bld POSITIVE (A) NEGATIVE    Final diagnoses:  Lower GI bleed      Shanon Rosser, MD 09/11/15 1544

## 2015-09-11 NOTE — H&P (Signed)
History and Physical    Troy [name] [id number] DOB: Aug 14, 1940 DOA: 09/11/2015  PCP: Barbette Merino, MD  Patient coming from: home  Chief Complaint: Bright red blood per rectum  HPI: Troy [name] is a 75 y.o. male with medical history significant of atrial fibrillation on Coumadin, AICD, chronic systolic heart failure with an EF of 25%, chronic respiratory failure on home oxygen, chronic NEC stage 3, rheumatic colonoscopy in 2012 by Dr. Orvan July or bright red blood per rectum and at that time he had diverticulosis with no polyps. Comes in today for bright red blood per rectum that started on the day of admission he relates he has had to bloody bowel movements. He denies any chest pain shortness of breath diarrhea lightheadedness or dizziness.  ED Course: He was started on IV fluids, CBC was done that showed a hemoglobin of 11.8 (previous hemoglobin on 08/25/2015 showed a hemoglobin of [id number]), basic metabolic panel showed a baseline creatinine of 1.3 and an INR that was therapeutic at 2.6.  Review of Systems: As per HPI otherwise 10 point review of systems negative.    Past Medical History:  Diagnosis Date  . Atrial fibrillation ([place])   . CAD (coronary artery disease) 1996   status post PCI of the RCA   . Congestive heart failure, unspecified   . COPD (chronic obstructive pulmonary disease) (HCC)    Pt on home O2 at night and PRN, unsure of date of diagnosis.  . Diabetes mellitus, type 2 ([place])   . DJD (degenerative joint disease)   . GERD (gastroesophageal reflux disease)   . Gout   . HTN (hypertension)   . Ischemic dilated cardiomyopathy   . Nephrolithiasis   . Other primary cardiomyopathies   . Personal history of DVT (deep vein thrombosis)   . Prostatitis     Past Surgical History:  Procedure Laterality Date  . BACK SURGERY    . CARDIAC DEFIBRILLATOR PLACEMENT    . CORONARY STENT PLACEMENT    . PACEMAKER INSERTION       reports that he quit smoking about 35  years ago. His smoking use included Cigarettes. He has a 200.00 pack-year smoking history. He has never used smokeless tobacco. He reports that he does not drink alcohol or use drugs.  Allergies  Allergen Reactions  . Benadryl [Diphenhydramine Hcl] Nausea And Vomiting    Family History  Problem Relation Age of Onset  . Heart disease Father   . Cancer Father     LUNG  . Colon cancer Mother   . Stroke Paternal Uncle   . Heart attack Neg Hx   . Hyperlipidemia Neg Hx   . Hypertension Neg Hx     Prior to Admission medications   Medication Sig Start Date End Date Taking? Authorizing Provider  albuterol (ACCUNEB) 1.25 MG/3ML nebulizer solution Take 3 mLs by nebulization 4 (four) times daily. 08/21/15  Yes Historical Provider, MD  alfuzosin (UROXATRAL) 10 MG 24 hr tablet Take 10 mg by mouth daily with breakfast.    Yes Historical Provider, MD  allopurinol (ZYLOPRIM) 100 MG tablet Take 100 mg by mouth daily.   Yes Historical Provider, MD  atorvastatin (LIPITOR) 10 MG tablet Take 1 tablet (10 mg total) by mouth at bedtime. 08/03/14  Yes Jettie Booze, MD  carvedilol (COREG) 12.5 MG tablet Take 1 tablet (12.5 mg total) by mouth 2 (two) times daily with a meal. 03/03/15  Yes Jettie Booze, MD  cetirizine (ZYRTEC) 10 MG tablet Take 10  mg by mouth daily.   Yes Historical Provider, MD  digoxin (LANOXIN) 0.125 MG tablet Take 0.5 tablets (0.0625 mg total) by mouth daily. 06/23/15  Yes Jettie Booze, MD  fluticasone (FLONASE) 50 MCG/ACT nasal spray Place 2 sprays into both nostrils daily as needed for allergies.  04/07/13  Yes Historical Provider, MD  furosemide (LASIX) 40 MG tablet Take 2 tablets (80 mg total) by mouth daily. with food Patient taking differently: Take 40 mg by mouth 2 (two) times daily. with food 08/17/15  Yes Jettie Booze, MD  hydrALAZINE (APRESOLINE) 25 MG tablet Take 1 tablet (25 mg total) by mouth 3 (three) times daily. 05/31/15  Yes Jettie Booze, MD    losartan (COZAAR) 50 MG tablet Take 50 mg by mouth daily. 06/08/15  Yes Historical Provider, MD  potassium chloride (K-DUR) 10 MEQ tablet Take 2 tablets (20 mEq total) by mouth daily. Patient taking differently: Take 10 mEq by mouth 2 (two) times daily.  08/14/15  Yes Jettie Booze, MD  PROAIR HFA 108 [phone] BASE) MCG/ACT inhaler INHALE 2 PUFFS EVERY 4 HOURS AS NEEDED FOR WHEEZING OR SHORTNESS OF BREATH. 09/05/14  Yes Collene Gobble, MD  warfarin (COUMADIN) 1 MG tablet Take 2 tabs daily except 3 tabs on Wednesday & Saturday or as directed by Coumadin Clinic Patient taking differently: Take 2-3 mg by mouth one time only at 6 PM. Take 2 tabs daily except 3 tabs on Wednesday & Saturday or as directed by Coumadin Clinic 08/30/15  Yes Clanford Marisa Hua, MD  dextromethorphan (DELSYM) 30 MG/5ML liquid Take 5 mLs (30 mg total) by mouth 2 (two) times daily as needed for cough. Patient not taking: Reported on 09/11/2015 08/27/15   Clanford Marisa Hua, MD  mometasone-formoterol (DULERA) 100-5 MCG/ACT AERO Inhale 2 puffs into the lungs 2 (two) times daily. Patient not taking: Reported on 08/24/2015 11/26/13   Kinnie Feil, MD    Physical Exam: Vitals:   09/11/15 1208 09/11/15 1209 09/11/15 1536  BP: 128/60  140/73  Pulse: 76  102  Resp: 16  18  Temp: 97.9 F (36.6 C)  97.9 F (36.6 C)  TempSrc: Oral  Oral  SpO2: 96%  97%  Weight:  68 kg (150 lb)   Height:  5\' 6"  (1.676 m)       Constitutional: NAD, calm, comfortable Vitals:   09/11/15 1208 09/11/15 1209 09/11/15 1536  BP: 128/60  140/73  Pulse: 76  102  Resp: 16  18  Temp: 97.9 F (36.6 C)  97.9 F (36.6 C)  TempSrc: Oral  Oral  SpO2: 96%  97%  Weight:  68 kg (150 lb)   Height:  5\' 6"  (1.676 m)    Eyes: PERRL, lids and conjunctivae normal ENMT: Mucous membranes are moist. Posterior pharynx clear of any exudate or lesions.Normal dentition.  Neck: normal, supple, no masses, no thyromegaly Respiratory: clear to auscultation bilaterally,  no wheezing, no crackles. Normal respiratory effort. No accessory muscle use.  Cardiovascular: Regular rate and rhythm, no murmurs / rubs / gallops. No extremity edema. 2+ pedal pulses. No carotid bruits.  Abdomen: no tenderness, no masses palpated. No hepatosplenomegaly. Bowel sounds positive.  Musculoskeletal: no clubbing / cyanosis. No joint deformity upper and lower extremities. Good ROM, no contractures. Normal muscle tone.  Skin: no rashes, lesions, ulcers. No induration Neurologic: CN 2-12 grossly intact. Sensation intact, DTR normal. Strength 5/5 in all 4.  Psychiatric: Normal judgment and insight. Alert and oriented x 3. Normal  mood.    Labs on Admission: I have personally reviewed following labs and imaging studies  CBC:  Recent Labs Lab 09/11/15 1243  WBC 7.3  HGB 11.8*  HCT 34.4*  MCV 99.7  PLT 0000000*   Basic Metabolic Panel:  Recent Labs Lab 09/11/15 1243  NA 141  K 4.1  CL 102  CO2 35*  GLUCOSE 103*  BUN 23*  CREATININE 1.37*  CALCIUM 9.2   GFR: Estimated Creatinine Clearance: 42 mL/min (by C-G formula based on SCr of 1.37 mg/dL). Liver Function Tests:  Recent Labs Lab 09/11/15 1243  AST 17  ALT 23  ALKPHOS 83  BILITOT 0.8  PROT 6.8  ALBUMIN 3.2*   No results for input(s): LIPASE, AMYLASE in the last 168 hours. No results for input(s): AMMONIA in the last 168 hours. Coagulation Profile:  Recent Labs Lab 09/08/15 09/11/15 1243  INR 3.3 2.69   Cardiac Enzymes: No results for input(s): CKTOTAL, CKMB, CKMBINDEX, TROPONINI in the last 168 hours. BNP (last 3 results) No results for input(s): PROBNP in the last 8760 hours. HbA1C: No results for input(s): HGBA1C in the last 72 hours. CBG: No results for input(s): GLUCAP in the last 168 hours. Lipid Profile: No results for input(s): CHOL, HDL, LDLCALC, TRIG, CHOLHDL, LDLDIRECT in the last 72 hours. Thyroid Function Tests: No results for input(s): TSH, T4TOTAL, FREET4, T3FREE, THYROIDAB in the  last 72 hours. Anemia Panel: No results for input(s): VITAMINB12, FOLATE, FERRITIN, TIBC, IRON, RETICCTPCT in the last 72 hours. Urine analysis:    Component Value Date/Time   COLORURINE YELLOW 03/05/2015 1345   APPEARANCEUR CLOUDY (A) 03/05/2015 1345   LABSPEC 1.011 03/05/2015 1345   PHURINE 5.0 03/05/2015 1345   GLUCOSEU NEGATIVE 03/05/2015 1345   HGBUR NEGATIVE 03/05/2015 1345   BILIRUBINUR NEGATIVE 03/05/2015 1345   KETONESUR NEGATIVE 03/05/2015 1345   PROTEINUR NEGATIVE 03/05/2015 1345   UROBILINOGEN 0.2 11/21/2013 1946   NITRITE NEGATIVE 03/05/2015 1345   LEUKOCYTESUR NEGATIVE 03/05/2015 1345   Sepsis Labs: !!!!!!!!!!!!!!!!!!!!!!!!!!!!!!!!!!!!!!!!!!!! @LABRCNTIP (procalcitonin:4,lacticidven:4) )No results found for this or any previous visit (from the past 240 hour(s)).   Radiological Exams on Admission: No results found.  EKG: Independently reviewed.  Assessment/Plan Lower GI bleed Probably due to Diverticulosis of colon with hemorrhage I will admitted to [facility], place 2 large-bore was on IVs hold his Coumadin check INRs daily, type and screen 2 units pack red blood cells. And monitor his hemoglobin closely. 2012 he had a colonoscopy that showed diverticulosis with no polyps, so this is most likely cause.  Chronic systolic CHF (congestive heart failure) (HCC) Seems to be euvolemic continue current home regimen. We'll place him on a low sodium diet fluid resected.  Chronic atrial fibrillation (HCC) Rate controlled on Coumadin see above for further details.  COPD (chronic obstructive pulmonary disease) ([place]) Baseline continue current home oxygen and regimen.  Chronic kidney disease, stage 3 Ending at baseline we'll continue current home oxygen regimen.     DVT prophylaxis: SCD's Code Status: full Family Communication: noe Disposition Plan: 2-3days Consults called: none Admission status: observation   Charlynne Cousins MD [facility] Pager [phone]  If 7PM-7AM, please contact night-coverage [email or web address] Password [facility]  09/11/2015, 4:44 PM

## 2015-09-11 NOTE — ED Notes (Signed)
PT CAN GO TO FLOOR AT 17:15.

## 2015-09-11 NOTE — ED Notes (Signed)
Admitting RN at bedside.  

## 2015-09-12 DIAGNOSIS — I482 Chronic atrial fibrillation: Secondary | ICD-10-CM

## 2015-09-12 DIAGNOSIS — K625 Hemorrhage of anus and rectum: Secondary | ICD-10-CM

## 2015-09-12 DIAGNOSIS — J449 Chronic obstructive pulmonary disease, unspecified: Secondary | ICD-10-CM

## 2015-09-12 LAB — CBC
HEMATOCRIT: 32.1 % — AB (ref 39.0–52.0)
HEMOGLOBIN: 10.6 g/dL — AB (ref 13.0–17.0)
MCH: 33.4 pg (ref 26.0–34.0)
MCHC: 33 g/dL (ref 30.0–36.0)
MCV: 101.3 fL — ABNORMAL HIGH (ref 78.0–100.0)
Platelets: 128 10*3/uL — ABNORMAL LOW (ref 150–400)
RBC: 3.17 MIL/uL — AB (ref 4.22–5.81)
RDW: 14.4 % (ref 11.5–15.5)
WBC: 7.3 10*3/uL (ref 4.0–10.5)

## 2015-09-12 LAB — PROTIME-INR
INR: 2.81
PROTHROMBIN TIME: 30.2 s — AB (ref 11.4–15.2)

## 2015-09-12 MED ORDER — MOMETASONE FURO-FORMOTEROL FUM 100-5 MCG/ACT IN AERO
2.0000 | INHALATION_SPRAY | Freq: Two times a day (BID) | RESPIRATORY_TRACT | Status: DC
  Administered 2015-09-12 – 2015-09-13 (×2): 2 via RESPIRATORY_TRACT
  Filled 2015-09-12: qty 8.8

## 2015-09-12 MED ORDER — ALBUTEROL SULFATE (2.5 MG/3ML) 0.083% IN NEBU
2.5000 mL | INHALATION_SOLUTION | Freq: Four times a day (QID) | RESPIRATORY_TRACT | Status: DC
  Administered 2015-09-12 – 2015-09-13 (×3): 2.5 mL via RESPIRATORY_TRACT
  Filled 2015-09-12 (×3): qty 3

## 2015-09-12 NOTE — Progress Notes (Signed)
PROGRESS NOTE    Troy [name]  [id number] DOB: 07/02/1940 DOA: 09/11/2015 PCP: Barbette Merino, MD   Outpatient Specialists:     Brief Narrative:  Troy [name] is a 75 y.o. male with a history of atrial fibrillation on Coumadin. His INR was high recently and he held his Coumadin for one day. He has been having rectal bleeding since this morning. He describes it as bright red blood mixed with stool. He has had multiple episodes. He does not have any perianal hemorrhoids or other lesions that he is aware of. He denies abdominal pain, nausea and vomiting. He denies chest pain and shortness of breath.  Had colonoscopy in 2012 by Dr. Cristina Gong for bright red blood per rectum and at that time he had diverticulosis with no polyps   Assessment & Plan:   Active Problems:   Chronic systolic CHF (congestive heart failure) (HCC)   Chronic atrial fibrillation (HCC)   COPD (chronic obstructive pulmonary disease) (HCC)   Chronic kidney disease, stage 3   Diverticulosis of colon with hemorrhage   Lower GI bleed   Bright red blood per rectum   Lower GI bleed Probably due to Diverticulosis of colon with hemorrhage Monitor Hgb- check in AM 2012 he had a colonoscopy that showed diverticulosis with no polyps, so this is most likely cause.  Chronic systolic CHF (congestive heart failure) ([place]) -euvolemic continue current home regimen. We'll place him on a low sodium diet fluid resected.  Chronic atrial fibrillation (HCC) Rate controlled  -on Coumadin   COPD (chronic obstructive pulmonary disease) (HCC) -severe in nature -was seeing Dr. Lamonte Sakai in 2015 but never went back -needs to follow up  Chronic kidney disease, stage 3 -at baseline  Infected cyst -upper back -apply wet heat  DVT prophylaxis:  SCD's  Code Status: Full Code   Family Communication: No family at bedside  Disposition Plan:     Consultants:     Procedures:        Subjective: Had a normal  BM  Objective: Vitals:   09/12/15 0720 09/12/15 0905 09/12/15 1500 09/12/15 1607  BP:  132/63    Pulse:  93 64   Resp:   18   Temp:   97.9 F (36.6 C)   TempSrc:   Oral   SpO2: 99%  97% 93%  Weight:      Height:        Intake/Output Summary (Last 24 hours) at 09/12/15 1642 Last data filed at 09/12/15 1500  Gross per 24 hour  Intake           603.17 ml  Output             1825 ml  Net         -1221.83 ml   Filed Weights   09/11/15 1209  Weight: 68 kg (150 lb)    Examination:  General exam: Appears calm and comfortable  Respiratory system: exp wheezes Cardiovascular system: S1 & S2 heard, RRR. No JVD, murmurs, rubs, gallops or clicks. No pedal edema. Gastrointestinal system: Abdomen is nondistended, soft and nontender. No organomegaly or masses felt. Normal bowel sounds heard. Central nervous system: Alert and oriented. No focal neurological deficits. Extremities: Symmetric 5 x 5 power. Skin: No rashes, lesions or ulcers Psychiatry: Judgement and insight appear normal. Mood & affect appropriate.     Data Reviewed: I have personally reviewed following labs and imaging studies  CBC:  Recent Labs Lab 09/11/15 1243 09/12/15 0458  WBC 7.3 7.3  HGB 11.8* 10.6*  HCT 34.4* 32.1*  MCV 99.7 101.3*  PLT 128* 0000000*   Basic Metabolic Panel:  Recent Labs Lab 09/11/15 1243  NA 141  K 4.1  CL 102  CO2 35*  GLUCOSE 103*  BUN 23*  CREATININE 1.37*  CALCIUM 9.2   GFR: Estimated Creatinine Clearance: 42 mL/min (by C-G formula based on SCr of 1.37 mg/dL). Liver Function Tests:  Recent Labs Lab 09/11/15 1243  AST 17  ALT 23  ALKPHOS 83  BILITOT 0.8  PROT 6.8  ALBUMIN 3.2*   No results for input(s): LIPASE, AMYLASE in the last 168 hours. No results for input(s): AMMONIA in the last 168 hours. Coagulation Profile:  Recent Labs Lab 09/08/15 09/11/15 1243 09/12/15 0458  INR 3.3 2.69 2.81   Cardiac Enzymes: No results for input(s): CKTOTAL, CKMB,  CKMBINDEX, TROPONINI in the last 168 hours. BNP (last 3 results) No results for input(s): PROBNP in the last 8760 hours. HbA1C: No results for input(s): HGBA1C in the last 72 hours. CBG: No results for input(s): GLUCAP in the last 168 hours. Lipid Profile: No results for input(s): CHOL, HDL, LDLCALC, TRIG, CHOLHDL, LDLDIRECT in the last 72 hours. Thyroid Function Tests: No results for input(s): TSH, T4TOTAL, FREET4, T3FREE, THYROIDAB in the last 72 hours. Anemia Panel: No results for input(s): VITAMINB12, FOLATE, FERRITIN, TIBC, IRON, RETICCTPCT in the last 72 hours. Urine analysis:    Component Value Date/Time   COLORURINE YELLOW 03/05/2015 1345   APPEARANCEUR CLOUDY (A) 03/05/2015 1345   LABSPEC 1.011 03/05/2015 1345   PHURINE 5.0 03/05/2015 1345   GLUCOSEU NEGATIVE 03/05/2015 1345   HGBUR NEGATIVE 03/05/2015 1345   BILIRUBINUR NEGATIVE 03/05/2015 1345   KETONESUR NEGATIVE 03/05/2015 1345   PROTEINUR NEGATIVE 03/05/2015 1345   UROBILINOGEN 0.2 11/21/2013 1946   NITRITE NEGATIVE 03/05/2015 1345   LEUKOCYTESUR NEGATIVE 03/05/2015 1345     )No results found for this or any previous visit (from the past 240 hour(s)).    Anti-infectives    None       Radiology Studies: No results found.      Scheduled Meds: . albuterol  2.5 mL Nebulization QID  . alfuzosin  10 mg Oral Q breakfast  . allopurinol  100 mg Oral Daily  . atorvastatin  10 mg Oral QHS  . carvedilol  12.5 mg Oral BID WC  . digoxin  0.0625 mg Oral Daily  . furosemide  40 mg Oral BID  . losartan  50 mg Oral Daily  . mometasone-formoterol  2 puff Inhalation BID  . potassium chloride  10 mEq Oral BID  . sodium chloride flush  3 mL Intravenous Q12H   Continuous Infusions:    LOS: 1 day    Time spent: 35 min    [place], DO [facility] Pager [phone]  If 7PM-7AM, please contact night-coverage [email or web address] Password [facility] 09/12/2015, 4:42 PM

## 2015-09-13 DIAGNOSIS — N183 Chronic kidney disease, stage 3 (moderate): Secondary | ICD-10-CM

## 2015-09-13 LAB — BASIC METABOLIC PANEL
Anion gap: 6 (ref 5–15)
BUN: 22 mg/dL — AB (ref 6–20)
CALCIUM: 8.6 mg/dL — AB (ref 8.9–10.3)
CO2: 36 mmol/L — ABNORMAL HIGH (ref 22–32)
Chloride: 97 mmol/L — ABNORMAL LOW (ref 101–111)
Creatinine, Ser: 1.43 mg/dL — ABNORMAL HIGH (ref 0.61–1.24)
GFR calc Af Amer: 54 mL/min — ABNORMAL LOW (ref >60)
GFR, EST NON AFRICAN AMERICAN: 46 mL/min — AB (ref >60)
GLUCOSE: 92 mg/dL (ref 65–99)
Potassium: 4.1 mmol/L (ref 3.5–5.1)
Sodium: 139 mmol/L (ref 135–145)

## 2015-09-13 LAB — PROTIME-INR
INR: 2.31
PROTHROMBIN TIME: 25.8 s — AB (ref 11.4–15.2)

## 2015-09-13 LAB — CBC
HCT: 31.3 % — ABNORMAL LOW (ref 39.0–52.0)
Hemoglobin: 10.4 g/dL — ABNORMAL LOW (ref 13.0–17.0)
MCH: 32.8 pg (ref 26.0–34.0)
MCHC: 33.2 g/dL (ref 30.0–36.0)
MCV: 98.7 fL (ref 78.0–100.0)
PLATELETS: 127 10*3/uL — AB (ref 150–400)
RBC: 3.17 MIL/uL — ABNORMAL LOW (ref 4.22–5.81)
RDW: 13.9 % (ref 11.5–15.5)
WBC: 6.7 10*3/uL (ref 4.0–10.5)

## 2015-09-13 MED ORDER — CEPHALEXIN 500 MG PO CAPS: 500.0000 mg | ORAL_CAPSULE | Freq: Two times a day (BID) | ORAL | 0 refills | Status: DC

## 2015-09-13 MED ORDER — WARFARIN SODIUM 3 MG PO TABS
3.0000 mg | ORAL_TABLET | Freq: Once | ORAL | Status: DC
  Filled 2015-09-13: qty 1

## 2015-09-13 MED ORDER — WARFARIN SODIUM 1 MG PO TABS: ORAL_TABLET | ORAL | Status: DC

## 2015-09-13 MED ORDER — FUROSEMIDE 40 MG PO TABS: 40.0000 mg | ORAL_TABLET | Freq: Two times a day (BID) | ORAL | Status: DC

## 2015-09-13 MED ORDER — MOMETASONE FURO-FORMOTEROL FUM 100-5 MCG/ACT IN AERO: 2.0000 | INHALATION_SPRAY | Freq: Two times a day (BID) | RESPIRATORY_TRACT | 2 refills | Status: DC

## 2015-09-13 MED ORDER — WARFARIN - PHARMACIST DOSING INPATIENT
Freq: Every day | Status: DC
Start: 2015-09-13 — End: 2015-09-13

## 2015-09-13 MED ORDER — CEPHALEXIN 500 MG PO CAPS
500.0000 mg | ORAL_CAPSULE | Freq: Two times a day (BID) | ORAL | Status: DC
  Administered 2015-09-13: 500 mg via ORAL
  Filled 2015-09-13: qty 1

## 2015-09-13 MED ORDER — POTASSIUM CHLORIDE ER 10 MEQ PO TBCR: 10.0000 meq | EXTENDED_RELEASE_TABLET | Freq: Two times a day (BID) | ORAL | Status: DC

## 2015-09-13 NOTE — Discharge Summary (Signed)
Physician Discharge Summary  Troy [name] [id number] DOB: [birth year] DOA: 09/11/2015  PCP: Barbette Merino, MD  Admit date: 09/11/2015 Discharge date: 09/13/2015   Recommendations for Outpatient Follow-Up:   1. Home health 2. INR follow up   Discharge Diagnosis:   Active Problems:   Chronic systolic CHF (congestive heart failure) (HCC)   Chronic atrial fibrillation (HCC)   COPD (chronic obstructive pulmonary disease) (HCC)   Chronic kidney disease, stage 3   Diverticulosis of colon with hemorrhage   Lower GI bleed   Bright red blood per rectum   Discharge disposition:  Home.  Discharge Condition: Improved.  Diet recommendation: Low sodium, heart healthy.  Carbohydrate-modified  Wound care: None.   History of Present Illness:   Troy [name] a 75 y.o.malewith a history of atrial fibrillation on Coumadin. His INR was high recently and he held his Coumadin for one day. He has been having rectal bleeding since this morning. He describes it as bright red blood mixed with stool. He has had multiple episodes. He does not have any perianal hemorrhoids or other lesions that he is aware of. He denies abdominal pain, nausea and vomiting. He denies chest pain and shortness of breath.  Had colonoscopy in 2012 by Dr. Cristina Gong for bright red blood per rectum and at that time he had diverticulosis with no polyps   Hospital Course by Problem:   Lower GI bleed Probably due to Diverticulosis of colon with hemorrhage No further bleeding 2012 he had a colonoscopy that showed diverticulosis with no polyps, so this is most likely cause.  Chronic systolic CHF (congestive heart failure) ([place]) -euvolemic continue current home regimen. We'll place him on alow sodium diet fluid resected.  Chronic atrial fibrillation (HCC) Rate controlled  -on Coumadin   COPD (chronic obstructive pulmonary disease) (HCC) -severe in nature -was seeing Dr. Lamonte Sakai in 2015 but never went  back -needs to follow up  Chronic kidney disease, stage 3 -at baseline  Infected sebaceous cyst -upper back -apply heat Keflex PO-- if not improved follow up with derm    Medical Consultants:    None.   Discharge Exam:   Vitals:   09/12/15 2055 09/13/15 0437  BP: (!) 128/52 129/60  Pulse: 64 64  Resp: 20 20  Temp: 99 F (37.2 C) 99 F (37.2 C)   Vitals:   09/12/15 2055 09/13/15 0437 09/13/15 0743 09/13/15 1216  BP: (!) 128/52 129/60    Pulse: 64 64    Resp: 20 20    Temp: 99 F (37.2 C) 99 F (37.2 C)    TempSrc: Oral Oral    SpO2: 95% 98% 98% 94%  Weight:  72.4 kg (159 lb 9.6 oz)    Height:        Gen:  NAD   The results of significant diagnostics from this hospitalization (including imaging, microbiology, ancillary and laboratory) are listed below for reference.     Procedures and Diagnostic Studies:   No results found.   Labs:   Basic Metabolic Panel:  Recent Labs Lab 09/11/15 1243 09/13/15 0505  NA 141 139  K 4.1 4.1  CL 102 97*  CO2 35* 36*  GLUCOSE 103* 92  BUN 23* 22*  CREATININE 1.37* 1.43*  CALCIUM 9.2 8.6*   GFR Estimated Creatinine Clearance: 40.3 mL/min (by C-G formula based on SCr of 1.43 mg/dL). Liver Function Tests:  Recent Labs Lab 09/11/15 1243  AST 17  ALT 23  ALKPHOS 83  BILITOT 0.8  PROT 6.8  ALBUMIN 3.2*   No results for input(s): LIPASE, AMYLASE in the last 168 hours. No results for input(s): AMMONIA in the last 168 hours. Coagulation profile  Recent Labs Lab 09/08/15 09/11/15 1243 09/12/15 0458 09/13/15 0505  INR 3.3 2.69 2.81 2.31    CBC:  Recent Labs Lab 09/11/15 1243 09/12/15 0458 09/13/15 0505  WBC 7.3 7.3 6.7  HGB 11.8* 10.6* 10.4*  HCT 34.4* 32.1* 31.3*  MCV 99.7 101.3* 98.7  PLT 128* 128* 127*   Cardiac Enzymes: No results for input(s): CKTOTAL, CKMB, CKMBINDEX, TROPONINI in the last 168 hours. BNP: Invalid input(s): POCBNP CBG: No results for input(s): GLUCAP in the  last 168 hours. D-Dimer No results for input(s): DDIMER in the last 72 hours. Hgb A1c No results for input(s): HGBA1C in the last 72 hours. Lipid Profile No results for input(s): CHOL, HDL, LDLCALC, TRIG, CHOLHDL, LDLDIRECT in the last 72 hours. Thyroid function studies No results for input(s): TSH, T4TOTAL, T3FREE, THYROIDAB in the last 72 hours.  Invalid input(s): FREET3 Anemia work up No results for input(s): VITAMINB12, FOLATE, FERRITIN, TIBC, IRON, RETICCTPCT in the last 72 hours. Microbiology No results found for this or any previous visit (from the past 240 hour(s)).   Discharge Instructions:   Discharge Instructions    Diet - low sodium heart healthy    Complete by:  As directed   Discharge instructions    Complete by:  As directed   Home health RN -follow up with Pulm for your severe COPD-- Dr. Ashok Cordia or Dr. Lamonte Sakai -dermatology follow up for infected sebaceous cyst   Increase activity slowly    Complete by:  As directed       Medication List    STOP taking these medications   dextromethorphan 30 MG/5ML liquid Commonly known as:  DELSYM   hydrALAZINE 25 MG tablet Commonly known as:  APRESOLINE     TAKE these medications   alfuzosin 10 MG 24 hr tablet Commonly known as:  UROXATRAL Take 10 mg by mouth daily with breakfast.   allopurinol 100 MG tablet Commonly known as:  ZYLOPRIM Take 100 mg by mouth daily.   atorvastatin 10 MG tablet Commonly known as:  LIPITOR Take 1 tablet (10 mg total) by mouth at bedtime.   carvedilol 12.5 MG tablet Commonly known as:  COREG Take 1 tablet (12.5 mg total) by mouth 2 (two) times daily with a meal.   cephALEXin 500 MG capsule Commonly known as:  KEFLEX Take 1 capsule (500 mg total) by mouth every 12 (twelve) hours.   cetirizine 10 MG tablet Commonly known as:  ZYRTEC Take 10 mg by mouth daily.   digoxin 0.125 MG tablet Commonly known as:  LANOXIN Take 0.5 tablets (0.0625 mg total) by mouth daily.   fluticasone  50 MCG/ACT nasal spray Commonly known as:  FLONASE Place 2 sprays into both nostrils daily as needed for allergies.   furosemide 40 MG tablet Commonly known as:  LASIX Take 1 tablet (40 mg total) by mouth 2 (two) times daily. What changed:  how much to take  when to take this  additional instructions   losartan 50 MG tablet Commonly known as:  COZAAR Take 50 mg by mouth daily.   mometasone-formoterol 100-5 MCG/ACT Aero Commonly known as:  DULERA Inhale 2 puffs into the lungs 2 (two) times daily.   potassium chloride 10 MEQ tablet Commonly known as:  K-DUR Take 1 tablet (10 mEq total) by mouth 2 (two) times daily.   PROAIR  HFA 108 (90 Base) MCG/ACT inhaler Generic drug:  albuterol INHALE 2 PUFFS EVERY 4 HOURS AS NEEDED FOR WHEEZING OR SHORTNESS OF BREATH.   albuterol 1.25 MG/3ML nebulizer solution Commonly known as:  ACCUNEB Take 3 mLs by nebulization 4 (four) times daily.   warfarin 1 MG tablet Commonly known as:  COUMADIN Take 2 tabs daily except 3 tabs on Wednesday & Saturday or as directed by Coumadin Clinic What changed:  how much to take  how to take this  when to take this  additional instructions      Follow-up Information    GARBA,LAWAL, MD Follow up in 1 week(s).   Specialty:  Internal Medicine Contact information: [address]. [place] [address] [id number]        Collene Gobble., MD .   Specialty:  Pulmonary Disease Why:  for severe COPD-- not using inhalers Contact information: [address] [address] [phone]            Time coordinating discharge: 35 min  Signed:  Rukia Mcgillivray [place]   [facility] 09/13/2015, 3:18 PM

## 2015-09-13 NOTE — Progress Notes (Signed)
ANTICOAGULATION CONSULT NOTE - Initial Consult  Pharmacy Consult for warfarin Indication: atrial fibrillation  Allergies  Allergen Reactions  . Benadryl [Diphenhydramine Hcl] Nausea And Vomiting    Patient Measurements: Height: 5\' 6"  (167.6 cm) Weight: 159 lb 9.6 oz (72.4 kg) IBW/kg (Calculated) : 63.8 Heparin Dosing Weight:   Vital Signs: Temp: 99 F (37.2 C) (08/23 0437) Temp Source: Oral (08/23 0437) BP: 129/60 (08/23 0437) Pulse Rate: 64 (08/23 0437)  Labs:  Recent Labs  09/11/15 1243 09/12/15 0458 09/13/15 0505  HGB 11.8* 10.6* 10.4*  HCT 34.4* 32.1* 31.3*  PLT 128* 128* 127*  LABPROT 29.2* 30.2* 25.8*  INR 2.69 2.81 2.31  CREATININE 1.37*  --  1.43*    Estimated Creatinine Clearance: 40.3 mL/min (by C-G formula based on SCr of 1.43 mg/dL).   Medical History: Past Medical History:  Diagnosis Date  . Atrial fibrillation ([place])   . CAD (coronary artery disease) 1996   status post PCI of the RCA   . Congestive heart failure, unspecified   . COPD (chronic obstructive pulmonary disease) (HCC)    Pt on home O2 at night and PRN, unsure of date of diagnosis.  . Diabetes mellitus, type 2 ([place])   . DJD (degenerative joint disease)   . GERD (gastroesophageal reflux disease)   . Gout   . HTN (hypertension)   . Ischemic dilated cardiomyopathy   . Nephrolithiasis   . Other primary cardiomyopathies   . Personal history of DVT (deep vein thrombosis)   . Prostatitis     Medications:  Prescriptions Prior to Admission  Medication Sig Dispense Refill Last Dose  . albuterol (ACCUNEB) 1.25 MG/3ML nebulizer solution Take 3 mLs by nebulization 4 (four) times daily.  2 09/11/2015 at Unknown time  . alfuzosin (UROXATRAL) 10 MG 24 hr tablet Take 10 mg by mouth daily with breakfast.    09/11/2015 at Unknown time  . allopurinol (ZYLOPRIM) 100 MG tablet Take 100 mg by mouth daily.   09/11/2015 at Unknown time  . atorvastatin (LIPITOR) 10 MG tablet Take 1 tablet (10 mg total) by  mouth at bedtime. 90 tablet 1 09/10/2015 at Unknown time  . carvedilol (COREG) 12.5 MG tablet Take 1 tablet (12.5 mg total) by mouth 2 (two) times daily with a meal. 60 tablet 6 09/11/2015 at 0800  . cetirizine (ZYRTEC) 10 MG tablet Take 10 mg by mouth daily.   09/11/2015 at Unknown time  . digoxin (LANOXIN) 0.125 MG tablet Take 0.5 tablets (0.0625 mg total) by mouth daily. 45 tablet 3 09/11/2015 at Unknown time  . fluticasone (FLONASE) 50 MCG/ACT nasal spray Place 2 sprays into both nostrils daily as needed for allergies.    Past Week at Unknown time  . furosemide (LASIX) 40 MG tablet Take 2 tablets (80 mg total) by mouth daily. with food (Patient taking differently: Take 40 mg by mouth 2 (two) times daily. with food) 270 tablet 1 09/11/2015 at Unknown time  . hydrALAZINE (APRESOLINE) 25 MG tablet Take 1 tablet (25 mg total) by mouth 3 (three) times daily. 90 tablet 11 09/11/2015 at Unknown time  . losartan (COZAAR) 50 MG tablet Take 50 mg by mouth daily.  2 09/11/2015 at Unknown time  . potassium chloride (K-DUR) 10 MEQ tablet Take 2 tablets (20 mEq total) by mouth daily. (Patient taking differently: Take 10 mEq by mouth 2 (two) times daily. ) 180 tablet 1 09/11/2015 at Unknown time  . PROAIR HFA 108 (90 BASE) MCG/ACT inhaler INHALE 2 PUFFS EVERY 4  HOURS AS NEEDED FOR WHEEZING OR SHORTNESS OF BREATH. 8.5 g 1 Past Week at Unknown time  . warfarin (COUMADIN) 1 MG tablet Take 2 tabs daily except 3 tabs on Wednesday & Saturday or as directed by Coumadin Clinic (Patient taking differently: Take 2-3 mg by mouth one time only at 6 PM. Take 2 tabs daily except 3 tabs on Wednesday & Saturday or as directed by Coumadin Clinic)   09/10/2015 at 1700  . dextromethorphan (DELSYM) 30 MG/5ML liquid Take 5 mLs (30 mg total) by mouth 2 (two) times daily as needed for cough. (Patient not taking: Reported on 09/11/2015) 89 mL 0 Completed Course at Unknown time  . mometasone-formoterol (DULERA) 100-5 MCG/ACT AERO Inhale 2 puffs into  the lungs 2 (two) times daily. (Patient not taking: Reported on 08/24/2015) 1 Inhaler 1 Not Taking at Unknown time    Assessment: Pharmacy is consulted to dose warfarin in 75 yo male with history of afib. Pt's warfarin was initially held on admission as pt was admitted with possible GI bleed. Last dose pt took was on 8/20. Pt home regimen is warfarin 2 mg daily except 3 mg PO daily on Wednesdays and Saturdays.    Today, 09/13/15  Pt did not note any further bleeding, so it was felt safe to resume warfarin  Therapeutic INR 2.31, no doses in 2 days  Hgb has been stable, plt WNL  No New drug interactions  Goal of Therapy:  INR 2-3 Monitor platelets by anticoagulation protocol: Yes   Plan:   Warfarin 3 mg PO x 1   INR may drop as pt has not had doses in two days, so will order 3 mg dose  for today   Daily PT/INR  Monitor for signs and symptoms of bleeding  Royetta Asal, PharmD, BCPS Pager [phone] 09/13/2015 11:13 AM

## 2015-09-13 NOTE — Progress Notes (Signed)
Pt selected [place] for [facility]. Referral given to in house rep.

## 2015-09-14 ENCOUNTER — Ambulatory Visit (INDEPENDENT_AMBULATORY_CARE_PROVIDER_SITE_OTHER): Payer: Medicare HMO

## 2015-09-14 DIAGNOSIS — Z5181 Encounter for therapeutic drug level monitoring: Secondary | ICD-10-CM

## 2015-09-14 DIAGNOSIS — I482 Chronic atrial fibrillation, unspecified: Secondary | ICD-10-CM

## 2015-09-14 LAB — POCT INR: INR: 2.2

## 2015-09-21 ENCOUNTER — Ambulatory Visit (INDEPENDENT_AMBULATORY_CARE_PROVIDER_SITE_OTHER): Payer: Medicare HMO | Admitting: Internal Medicine

## 2015-09-21 DIAGNOSIS — I482 Chronic atrial fibrillation, unspecified: Secondary | ICD-10-CM

## 2015-09-21 DIAGNOSIS — Z5181 Encounter for therapeutic drug level monitoring: Secondary | ICD-10-CM

## 2015-09-21 LAB — POCT INR: INR: 1.9

## 2015-09-29 ENCOUNTER — Ambulatory Visit (INDEPENDENT_AMBULATORY_CARE_PROVIDER_SITE_OTHER): Payer: Medicare HMO | Admitting: Interventional Cardiology

## 2015-09-29 DIAGNOSIS — Z5181 Encounter for therapeutic drug level monitoring: Secondary | ICD-10-CM

## 2015-09-29 DIAGNOSIS — I482 Chronic atrial fibrillation, unspecified: Secondary | ICD-10-CM

## 2015-09-29 LAB — POCT INR: INR: 2.3

## 2015-10-05 ENCOUNTER — Encounter (HOSPITAL_COMMUNITY): Payer: Self-pay | Admitting: Emergency Medicine

## 2015-10-05 ENCOUNTER — Ambulatory Visit (INDEPENDENT_AMBULATORY_CARE_PROVIDER_SITE_OTHER): Payer: Medicare HMO | Admitting: Cardiovascular Disease

## 2015-10-05 ENCOUNTER — Emergency Department (HOSPITAL_COMMUNITY): Payer: Medicare HMO

## 2015-10-05 ENCOUNTER — Emergency Department (HOSPITAL_COMMUNITY)
Admission: EM | Admit: 2015-10-05 | Discharge: 2015-10-05 | Disposition: A | Discharge status: Home / Self Care | Payer: Medicare HMO | Attending: Emergency Medicine | Admitting: Emergency Medicine

## 2015-10-05 DIAGNOSIS — Z7901 Long term (current) use of anticoagulants: Secondary | ICD-10-CM | POA: Diagnosis not present

## 2015-10-05 DIAGNOSIS — J449 Chronic obstructive pulmonary disease, unspecified: Secondary | ICD-10-CM | POA: Insufficient documentation

## 2015-10-05 DIAGNOSIS — I482 Chronic atrial fibrillation, unspecified: Secondary | ICD-10-CM

## 2015-10-05 DIAGNOSIS — I509 Heart failure, unspecified: Secondary | ICD-10-CM | POA: Diagnosis not present

## 2015-10-05 DIAGNOSIS — I130 Hypertensive heart and chronic kidney disease with heart failure and stage 1 through stage 4 chronic kidney disease, or unspecified chronic kidney disease: Secondary | ICD-10-CM | POA: Insufficient documentation

## 2015-10-05 DIAGNOSIS — Z87891 Personal history of nicotine dependence: Secondary | ICD-10-CM | POA: Diagnosis not present

## 2015-10-05 DIAGNOSIS — Z79899 Other long term (current) drug therapy: Secondary | ICD-10-CM | POA: Diagnosis not present

## 2015-10-05 DIAGNOSIS — N183 Chronic kidney disease, stage 3 (moderate): Secondary | ICD-10-CM | POA: Insufficient documentation

## 2015-10-05 DIAGNOSIS — R55 Syncope and collapse: Secondary | ICD-10-CM | POA: Diagnosis present

## 2015-10-05 DIAGNOSIS — Z7951 Long term (current) use of inhaled steroids: Secondary | ICD-10-CM | POA: Insufficient documentation

## 2015-10-05 DIAGNOSIS — R42 Dizziness and giddiness: Secondary | ICD-10-CM | POA: Insufficient documentation

## 2015-10-05 DIAGNOSIS — Z5181 Encounter for therapeutic drug level monitoring: Secondary | ICD-10-CM

## 2015-10-05 DIAGNOSIS — E1122 Type 2 diabetes mellitus with diabetic chronic kidney disease: Secondary | ICD-10-CM | POA: Diagnosis not present

## 2015-10-05 DIAGNOSIS — I2510 Atherosclerotic heart disease of native coronary artery without angina pectoris: Secondary | ICD-10-CM | POA: Diagnosis not present

## 2015-10-05 LAB — COMPREHENSIVE METABOLIC PANEL
ALBUMIN: 3.6 g/dL (ref 3.5–5.0)
ALT: 15 U/L — AB (ref 17–63)
AST: 18 U/L (ref 15–41)
Alkaline Phosphatase: 75 U/L (ref 38–126)
Anion gap: 7 (ref 5–15)
BUN: 28 mg/dL — AB (ref 6–20)
CHLORIDE: 105 mmol/L (ref 101–111)
CO2: 31 mmol/L (ref 22–32)
CREATININE: 1.8 mg/dL — AB (ref 0.61–1.24)
Calcium: 9.1 mg/dL (ref 8.9–10.3)
GFR calc Af Amer: 41 mL/min — ABNORMAL LOW (ref >60)
GFR calc non Af Amer: 35 mL/min — ABNORMAL LOW (ref >60)
GLUCOSE: 95 mg/dL (ref 65–99)
POTASSIUM: 4.6 mmol/L (ref 3.5–5.1)
SODIUM: 143 mmol/L (ref 135–145)
Total Bilirubin: 0.8 mg/dL (ref 0.3–1.2)
Total Protein: 6.9 g/dL (ref 6.5–8.1)

## 2015-10-05 LAB — CBG MONITORING, ED
GLUCOSE-CAPILLARY: 85 mg/dL (ref 65–99)
Glucose-Capillary: 87 mg/dL (ref 65–99)

## 2015-10-05 LAB — CBC WITH DIFFERENTIAL/PLATELET
Basophils Absolute: 0 10*3/uL (ref 0.0–0.1)
Basophils Relative: 0 %
EOS ABS: 0.2 10*3/uL (ref 0.0–0.7)
EOS PCT: 2 %
HCT: 37.3 % — ABNORMAL LOW (ref 39.0–52.0)
HEMOGLOBIN: 12.4 g/dL — AB (ref 13.0–17.0)
Lymphocytes Relative: 19 %
Lymphs Abs: 1.8 10*3/uL (ref 0.7–4.0)
MCH: 32.8 pg (ref 26.0–34.0)
MCHC: 33.2 g/dL (ref 30.0–36.0)
MCV: 98.7 fL (ref 78.0–100.0)
MONOS PCT: 8 %
Monocytes Absolute: 0.8 10*3/uL (ref 0.1–1.0)
Neutro Abs: 6.9 10*3/uL (ref 1.7–7.7)
Neutrophils Relative %: 71 %
PLATELETS: 129 10*3/uL — AB (ref 150–400)
RBC: 3.78 MIL/uL — ABNORMAL LOW (ref 4.22–5.81)
RDW: 14.4 % (ref 11.5–15.5)
WBC: 9.6 10*3/uL (ref 4.0–10.5)

## 2015-10-05 LAB — URINALYSIS, ROUTINE W REFLEX MICROSCOPIC
BILIRUBIN URINE: NEGATIVE
GLUCOSE, UA: NEGATIVE mg/dL
HGB URINE DIPSTICK: NEGATIVE
KETONES UR: NEGATIVE mg/dL
Leukocytes, UA: NEGATIVE
Nitrite: NEGATIVE
PH: 6.5 (ref 5.0–8.0)
Protein, ur: NEGATIVE mg/dL
Specific Gravity, Urine: 1.016 (ref 1.005–1.030)

## 2015-10-05 LAB — PROTIME-INR
INR: 2.17
PROTHROMBIN TIME: 24.5 s — AB (ref 11.4–15.2)

## 2015-10-05 LAB — I-STAT TROPONIN, ED: Troponin i, poc: 0.03 ng/mL (ref 0.00–0.08)

## 2015-10-05 LAB — POCT INR: INR: 2.4

## 2015-10-05 LAB — DIGOXIN LEVEL: Digoxin Level: 0.6 ng/mL — ABNORMAL LOW (ref 0.8–2.0)

## 2015-10-05 MED ORDER — SODIUM CHLORIDE 0.9 % IV BOLUS (SEPSIS)
1000.0000 mL | Freq: Once | INTRAVENOUS | Status: AC
  Administered 2015-10-05: 1000 mL via INTRAVENOUS

## 2015-10-05 NOTE — ED Notes (Signed)
Pt tolerated ambulation well. O2 stats stayed at 93%

## 2015-10-05 NOTE — ED Notes (Signed)
Pt made aware of urine sample needed. Not able to give now.

## 2015-10-05 NOTE — ED Triage Notes (Signed)
Pt complaining of fall this morning. States that he felt dizzy and landed on his left side on the floor. Denies hitting head, denies LOC. States that he has had "spells" like this in the past with no diagnosis. Pt complaining of pain to left shoulder and hip.

## 2015-10-05 NOTE — ED Provider Notes (Signed)
[facility] DEPT Provider Note   CSN: [id number] Arrival date & time: 10/05/15  1012     History   Chief Complaint Chief Complaint  Patient presents with  . Fall  . Near Syncope    HPI Troy [name] is a 75 y.o. male with a past medical history of A. fib, coronary artery disease, status post PCI, CHF, COPD, diabetes mellitus. She does not take any medications at this time, ischemic cardiomyopathy, paroxysmal atrial fibrillation, status post pacemaker and history of previous syncopal episodes who presents to the emergency department for  Pre-syncope. The patient is a poor historian. He states that he was doing well today when suddenly his "head started spinning." Patient states that normally if he consented down, it resolves. However, today he fell to the floor. He denies hitting his head. He never lost any consciousness. He has a history of arthritis in his left shoulder and states it hurts a little bit more now. The patient is on Coumadin but denies any melena or hematochezia. He was admitted to the hospital last month for a GI bleed. Patient denies any abdominal pain, nausea, vomiting, recent volume loss, racing or skipping in his heart. He denies headaches, visual disturbance, unilateral weakness, difficulty with gait or speech. HPI  Past Medical History:  Diagnosis Date  . Atrial fibrillation ([place])   . CAD (coronary artery disease) 1996   status post PCI of the RCA   . Congestive heart failure, unspecified   . COPD (chronic obstructive pulmonary disease) (HCC)    Pt on home O2 at night and PRN, unsure of date of diagnosis.  . Diabetes mellitus, type 2 ([place])   . DJD (degenerative joint disease)   . GERD (gastroesophageal reflux disease)   . Gout   . HTN (hypertension)   . Ischemic dilated cardiomyopathy   . Nephrolithiasis   . Other primary cardiomyopathies   . Personal history of DVT (deep vein thrombosis)   . Prostatitis     Patient Active Problem List   Diagnosis  Date Noted  . Lower GI bleed 09/11/2015  . Bright red blood per rectum 09/11/2015  . Community acquired pneumonia 08/24/2015  . Acute exacerbation of chronic obstructive pulmonary disease (COPD) ([place]) 03/05/2015  . Acute renal failure superimposed on stage 3 chronic kidney disease ([place]) 03/05/2015  . COPD with acute exacerbation ([place]) 03/05/2015  . Renal failure (ARF), acute on chronic (HCC)   . Stomach ache 01/18/2015  . COPD exacerbation ([place])   . AKI (acute kidney injury) ([place]) 12/26/2014  . BPH (benign prostatic hyperplasia) 12/25/2014  . FTT (failure to thrive) in adult 12/25/2014  . Encounter for therapeutic drug monitoring 08/05/2014  . Pulmonary nodules 12/23/2013  . Hemoptysis   . Dyspnea   . Acute on chronic systolic CHF (congestive heart failure) ([place])   . Chronic kidney disease, stage 3   . Coronary artery disease involving native coronary artery of native heart without angina pectoris   . Paroxysmal atrial fibrillation (HCC)   . Frequent PVCs   . Hypoxia   . Acute on chronic respiratory failure with hypoxia ([place])   . Chronic obstructive pulmonary disease with acute exacerbation ([place])   . COPD (chronic obstructive pulmonary disease) ([place]) 06/16/2013  . Lipoma of neck 09/11/2011  . 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 Coronary artery disease 01/30/2011  . Chronic atrial fibrillation ([place]) 01/30/2011  . Chronic systolic CHF (congestive heart failure) ([place])   . Other primary cardiomyopathies   . Diverticulosis  of colon with hemorrhage 08/28/2010    Past Surgical History:  Procedure Laterality Date  . BACK SURGERY    . CARDIAC DEFIBRILLATOR PLACEMENT    . CORONARY STENT PLACEMENT    . PACEMAKER INSERTION         Home Medications    Prior to Admission medications   Medication Sig Start Date End Date Taking? Authorizing Provider  acetaminophen (TYLENOL) 500 MG tablet Take 500-1,000 mg by mouth every 6 (six) hours as needed  for mild pain.   Yes Historical Provider, MD  albuterol (ACCUNEB) 1.25 MG/3ML nebulizer solution Take 3 mLs by nebulization 4 (four) times daily. 08/21/15  Yes Historical Provider, MD  alfuzosin (UROXATRAL) 10 MG 24 hr tablet Take 10 mg by mouth daily with breakfast.    Yes Historical Provider, MD  allopurinol (ZYLOPRIM) 100 MG tablet Take 100 mg by mouth daily.   Yes Historical Provider, MD  atorvastatin (LIPITOR) 10 MG tablet Take 1 tablet (10 mg total) by mouth at bedtime. 08/03/14  Yes Jettie Booze, MD  carvedilol (COREG) 12.5 MG tablet Take 1 tablet (12.5 mg total) by mouth 2 (two) times daily with a meal. 03/03/15  Yes Jettie Booze, MD  cetirizine (ZYRTEC) 10 MG tablet Take 10 mg by mouth daily.   Yes Historical Provider, MD  digoxin (LANOXIN) 0.125 MG tablet Take 0.5 tablets (0.0625 mg total) by mouth daily. 06/23/15  Yes Jettie Booze, MD  fluticasone (FLONASE) 50 MCG/ACT nasal spray Place 2 sprays into both nostrils daily as needed for allergies.  04/07/13  Yes Historical Provider, MD  furosemide (LASIX) 40 MG tablet Take 1 tablet (40 mg total) by mouth 2 (two) times daily. 09/13/15  Yes Geradine Girt, DO  losartan (COZAAR) 50 MG tablet Take 50 mg by mouth daily. 06/08/15  Yes Historical Provider, MD  mometasone-formoterol (DULERA) 100-5 MCG/ACT AERO Inhale 2 puffs into the lungs 2 (two) times daily. 09/13/15  Yes Geradine Girt, DO  potassium chloride (K-DUR) 10 MEQ tablet Take 1 tablet (10 mEq total) by mouth 2 (two) times daily. 09/13/15  Yes Jessica U Vann, DO  PROAIR HFA 108 (90 BASE) MCG/ACT inhaler INHALE 2 PUFFS EVERY 4 HOURS AS NEEDED FOR WHEEZING OR SHORTNESS OF BREATH. 09/05/14  Yes Collene Gobble, MD  warfarin (COUMADIN) 1 MG tablet Take 2 tabs daily except 3 tabs on Wednesday & Saturday or as directed by [facility] 09/13/15  Yes Jessica U Vann, DO  cephALEXin (KEFLEX) 500 MG capsule Take 1 capsule (500 mg total) by mouth every 12 (twelve) hours. Patient not taking:  Reported on 10/05/2015 09/13/15   Geradine Girt, DO    Family History Family History  Problem Relation Age of Onset  . Heart disease Father   . Cancer Father     LUNG  . Colon cancer Mother   . Stroke Paternal Uncle   . Heart attack Neg Hx   . Hyperlipidemia Neg Hx   . Hypertension Neg Hx     Social History Social History  Substance Use Topics  . Smoking status: Former Smoker    Packs/day: 4.00    Years: 50.00    Types: Cigarettes    Quit date: 01/22/1980  . Smokeless tobacco: Never Used  . Alcohol use No     Comment: denies     Allergies   Benadryl [diphenhydramine hcl]   Review of Systems Review of Systems  Ten systems reviewed and are negative for acute change, except as  noted in the HPI.   Physical Exam Updated Vital Signs BP 148/60   Pulse 78   Temp 97.9 F (36.6 C) (Oral)   Resp 15   Ht 5\' 6"  (1.676 m)   Wt 72.1 kg   SpO2 97%   BMI 25.66 kg/m   Physical Exam  Constitutional: He is oriented to person, place, and time. He appears well-developed and well-nourished. No distress.  HENT:  Head: Normocephalic and atraumatic.  Eyes: Conjunctivae are normal. Pupils are equal, round, and reactive to light. No scleral icterus.  Disconjugate gaze  Neck: Normal range of motion. Neck supple.  Cardiovascular: Normal rate, regular rhythm and normal heart sounds.   Pulmonary/Chest: Effort normal and breath sounds normal. No respiratory distress.  Abdominal: Soft. There is no tenderness.  Musculoskeletal: He exhibits no edema.  Neurological: He is alert and oriented to person, place, and time.  Speech is clear and goal oriented, follows commands Major Cranial nerves without deficit, no facial droop Normal strength in upper and lower extremities bilaterally including dorsiflexion and plantar flexion, strong and equal grip strength Sensation normal to light and sharp touch Moves extremities without ataxia, coordination intact Normal finger to nose and rapid  alternating movements Neg romberg, no pronator drift Normal gait Normal heel-shin and balance   Skin: Skin is warm and dry. He is not diaphoretic.  Psychiatric: His behavior is normal.  Nursing note and vitals reviewed.    ED Treatments / Results  Labs (all labs ordered are listed, but only abnormal results are displayed) Labs Reviewed  CBC WITH DIFFERENTIAL/PLATELET - Abnormal; Notable for the following:       Result Value   RBC 3.78 (*)    Hemoglobin 12.4 (*)    HCT 37.3 (*)    Platelets 129 (*)    All other components within normal limits  COMPREHENSIVE METABOLIC PANEL - Abnormal; Notable for the following:    BUN 28 (*)    Creatinine, Ser 1.80 (*)    ALT 15 (*)    GFR calc non Af Amer 35 (*)    GFR calc Af Amer 41 (*)    All other components within normal limits  PROTIME-INR - Abnormal; Notable for the following:    Prothrombin Time 24.5 (*)    All other components within normal limits  DIGOXIN LEVEL - Abnormal; Notable for the following:    Digoxin Level 0.6 (*)    All other components within normal limits  URINALYSIS, ROUTINE W REFLEX MICROSCOPIC (NOT AT [facility])  CBG MONITORING, ED  POCT CBG (FASTING - GLUCOSE)-MANUAL ENTRY  I-STAT TROPOININ, ED  CBG MONITORING, ED    EKG  EKG Interpretation  Date/Time:  Thursday October 05 2015 11:28:21 EDT Ventricular Rate:  64 PR Interval:    QRS Duration: 181 QT Interval:  492 QTC Calculation: 516 R Axis:   -170 Text Interpretation:  A-V dual-paced rhythm with some inhibition No further analysis attempted due to paced rhythm Confirmed by KNOTT MD, DANIEL [phone]) on 10/05/2015 11:40:30 AM       Radiology Ct Head Wo Contrast  Result Date: 10/05/2015 CLINICAL DATA:  [place] this morning, dizziness, landed on LEFT side on floor, denies loss of consciousness or striking head, has had similar spells in the past ; history hypertension, CHF, COPD, atrial fibrillation, coronary artery disease, ischemic dilated cardiomyopathy,  former smoker EXAM: CT HEAD WITHOUT CONTRAST TECHNIQUE: Contiguous axial images were obtained from the base of the skull through the vertex without intravenous contrast. COMPARISON:  06/13/2011 FINDINGS: Mild atrophy. Normal ventricular morphology. No midline shift or mass effect. Minimal small vessel chronic ischemic changes of deep cerebral white matter. No intracranial hemorrhage, mass lesion or evidence acute infarction. No extra-axial fluid collections. Bones and sinuses unremarkable. Atherosclerotic calcification at carotid siphons. IMPRESSION: Atrophy with minimal small vessel chronic ischemic changes of deep cerebral white matter. No acute intracranial abnormalities. Electronically Signed   By: Lavonia Dana M.D.   On: 10/05/2015 13:40    Procedures Procedures (including critical care time)  Medications Ordered in ED Medications  sodium chloride 0.9 % bolus 1,000 mL (0 mLs Intravenous Stopped 10/05/15 1648)     Initial Impression / Assessment and Plan / ED Course  I have reviewed the triage vital signs and the nursing notes.  Pertinent labs & imaging results that were available during my care of the patient were reviewed by me and considered in my medical decision making (see chart for details).  Clinical Course  Comment By Time  Patient appears to be orthostatic. Margarita Mail, PA-C 09/14 1428    Patient's workup is negative today. He does appear to have some orthostatic hypotension. Have his hemoglobin is improved since previous. Patient was able to ambulate in the emergency Department today without any difficulty after being given fluids. I have question if one of his medications like a diuretic or his antihypertensives could be potentiating his orthostasis. The patient is a multiple from his workup for syncope. 4. Patient is safe for discharge at this time. Discussed and reviewed the case with Dr. Alberteen Sam who has also seen the patient. Patient will be discharged at this time. Follow up  with his prescribing providers and I have given the patient a neuro consult.  Final Clinical Impressions(s) / ED Diagnoses   Final diagnoses:  Orthostatic dizziness  Pre-syncope    New Prescriptions Discharge Medication List as of 10/05/2015  4:37 PM       Margarita Mail, PA-C 10/05/15 2105    Leo Grosser, MD 10/07/15 1125

## 2015-10-05 NOTE — ED Notes (Signed)
MD at bedside. 

## 2015-10-05 NOTE — ED Notes (Signed)
Bed: WA04 Expected date:  Expected time:  Means of arrival:  Comments: 

## 2015-10-05 NOTE — ED Notes (Signed)
Pt states that home health nurse checked his INR this morning and it was 2.4

## 2015-10-06 ENCOUNTER — Telehealth: Payer: Self-pay | Admitting: Interventional Cardiology

## 2015-10-06 NOTE — Telephone Encounter (Signed)
Calling stating yesterday AM his head started "spinning" and was dizzy and fell to floor.  States he did not lose consciousness.  States he was alone in the home.  States he went to [facility] ER. According to the ER note his BP was 148/60 HR 78 (paced).  EKG performed and showed paced rhythm.  Labs normal.  Diagnosis was orthostatic hypotension.  States he feels fine today. He does not have a BP cuff at home to monitor his BP.  Suggested that he might consider purchasing a cuff to monitor his BP. Tried to give him an appointment to come in Monday 9/18 but he states he has another appointment.  He is scheduled to see [name] on 10/2 and just wants to keep that appointment.  Advised if he has any further symptoms to call our office.  He verbalizes understanding and will call if has any problems.

## 2015-10-06 NOTE — Telephone Encounter (Signed)
New message   Pt calling wanting to speak to the nurse. He states he had a problem yesterday with syncope.    Pt c/o Syncope: STAT if syncope occurred within 30 minutes and pt complains of lightheadedness High Priority if episode of passing out, completely, today or in last 24 hours   1. Did you pass out today? no  2. When is the last time you passed out?yesterday  3. Has this occurred multiple times? One time   4. Did you have any symptoms prior to passing out? No warning head spinning

## 2015-10-09 ENCOUNTER — Encounter (HOSPITAL_COMMUNITY): Payer: Self-pay | Admitting: Emergency Medicine

## 2015-10-09 ENCOUNTER — Emergency Department (HOSPITAL_COMMUNITY)
Admission: EM | Admit: 2015-10-09 | Discharge: 2015-10-09 | Disposition: A | Discharge status: Home / Self Care | Payer: Medicare HMO | Attending: Emergency Medicine | Admitting: Emergency Medicine

## 2015-10-09 DIAGNOSIS — J441 Chronic obstructive pulmonary disease with (acute) exacerbation: Secondary | ICD-10-CM | POA: Diagnosis not present

## 2015-10-09 DIAGNOSIS — Z87891 Personal history of nicotine dependence: Secondary | ICD-10-CM | POA: Diagnosis not present

## 2015-10-09 DIAGNOSIS — N183 Chronic kidney disease, stage 3 (moderate): Secondary | ICD-10-CM | POA: Insufficient documentation

## 2015-10-09 DIAGNOSIS — I5023 Acute on chronic systolic (congestive) heart failure: Secondary | ICD-10-CM | POA: Diagnosis not present

## 2015-10-09 DIAGNOSIS — I959 Hypotension, unspecified: Secondary | ICD-10-CM | POA: Diagnosis present

## 2015-10-09 DIAGNOSIS — I2510 Atherosclerotic heart disease of native coronary artery without angina pectoris: Secondary | ICD-10-CM | POA: Diagnosis not present

## 2015-10-09 DIAGNOSIS — E1122 Type 2 diabetes mellitus with diabetic chronic kidney disease: Secondary | ICD-10-CM | POA: Diagnosis not present

## 2015-10-09 DIAGNOSIS — R531 Weakness: Secondary | ICD-10-CM | POA: Insufficient documentation

## 2015-10-09 DIAGNOSIS — I130 Hypertensive heart and chronic kidney disease with heart failure and stage 1 through stage 4 chronic kidney disease, or unspecified chronic kidney disease: Secondary | ICD-10-CM | POA: Diagnosis not present

## 2015-10-09 DIAGNOSIS — Z79899 Other long term (current) drug therapy: Secondary | ICD-10-CM | POA: Diagnosis not present

## 2015-10-09 DIAGNOSIS — I951 Orthostatic hypotension: Secondary | ICD-10-CM

## 2015-10-09 LAB — CBC WITH DIFFERENTIAL/PLATELET
BASOS ABS: 0 10*3/uL (ref 0.0–0.1)
BASOS PCT: 0 %
EOS ABS: 0.3 10*3/uL (ref 0.0–0.7)
EOS PCT: 3 %
HCT: 37.6 % — ABNORMAL LOW (ref 39.0–52.0)
Hemoglobin: 12.6 g/dL — ABNORMAL LOW (ref 13.0–17.0)
Lymphocytes Relative: 23 %
Lymphs Abs: 1.9 10*3/uL (ref 0.7–4.0)
MCH: 33.9 pg (ref 26.0–34.0)
MCHC: 33.5 g/dL (ref 30.0–36.0)
MCV: 101.1 fL — ABNORMAL HIGH (ref 78.0–100.0)
MONO ABS: 0.7 10*3/uL (ref 0.1–1.0)
Monocytes Relative: 8 %
Neutro Abs: 5.6 10*3/uL (ref 1.7–7.7)
Neutrophils Relative %: 66 %
PLATELETS: 121 10*3/uL — AB (ref 150–400)
RBC: 3.72 MIL/uL — AB (ref 4.22–5.81)
RDW: 14.8 % (ref 11.5–15.5)
WBC: 8.5 10*3/uL (ref 4.0–10.5)

## 2015-10-09 LAB — BASIC METABOLIC PANEL
ANION GAP: 7 (ref 5–15)
BUN: 26 mg/dL — ABNORMAL HIGH (ref 6–20)
CALCIUM: 9.1 mg/dL (ref 8.9–10.3)
CO2: 29 mmol/L (ref 22–32)
Chloride: 105 mmol/L (ref 101–111)
Creatinine, Ser: 1.68 mg/dL — ABNORMAL HIGH (ref 0.61–1.24)
GFR calc Af Amer: 44 mL/min — ABNORMAL LOW (ref >60)
GFR, EST NON AFRICAN AMERICAN: 38 mL/min — AB (ref >60)
GLUCOSE: 91 mg/dL (ref 65–99)
POTASSIUM: 4.5 mmol/L (ref 3.5–5.1)
SODIUM: 141 mmol/L (ref 135–145)

## 2015-10-09 LAB — URINALYSIS, ROUTINE W REFLEX MICROSCOPIC
Bilirubin Urine: NEGATIVE
GLUCOSE, UA: NEGATIVE mg/dL
HGB URINE DIPSTICK: NEGATIVE
KETONES UR: NEGATIVE mg/dL
LEUKOCYTES UA: NEGATIVE
Nitrite: NEGATIVE
PROTEIN: NEGATIVE mg/dL
Specific Gravity, Urine: 1.016 (ref 1.005–1.030)
pH: 6 (ref 5.0–8.0)

## 2015-10-09 LAB — PROTIME-INR
INR: 1.99
PROTHROMBIN TIME: 22.9 s — AB (ref 11.4–15.2)

## 2015-10-09 MED ORDER — SODIUM CHLORIDE 0.9 % IV SOLN
1000.0000 mL | Freq: Once | INTRAVENOUS | Status: AC
  Administered 2015-10-09: 1000 mL via INTRAVENOUS

## 2015-10-09 MED ORDER — SODIUM CHLORIDE 0.9 % IV SOLN
1000.0000 mL | INTRAVENOUS | Status: DC
  Administered 2015-10-09: 1000 mL via INTRAVENOUS

## 2015-10-09 MED ORDER — FUROSEMIDE 40 MG PO TABS: 40.0000 mg | ORAL_TABLET | Freq: Every day | ORAL | 0 refills | Status: DC

## 2015-10-09 MED ORDER — LOSARTAN POTASSIUM 25 MG PO TABS: 25.0000 mg | ORAL_TABLET | Freq: Every day | ORAL | 0 refills | Status: DC

## 2015-10-09 NOTE — ED Triage Notes (Signed)
Pt c/o intermittent hypotension x "weeks now." Pt sts he has been seen here multiple times for the same thing. Pt sts last time he was seen here "they didn't know what was wrong with me." Pt sts his BP was 90 over something this morning. Currently BP is 135/64. Pt denies chest pain, dizziness, lightheadedness but c/o feeling "swimmy headed." Denies pain. A&Ox4.

## 2015-10-09 NOTE — ED Notes (Signed)
With attempt to start IV and administer medications as ordered. Pt refuses verbalizes "I do not want this patch work. They did this last time. This has been going on way too long. They need to do something else or I am leaving." This nurse verbalizes will have MD speak with pt regarding plan of care. [place] notified.

## 2015-10-09 NOTE — ED Notes (Signed)
[place] at bedside.

## 2015-10-11 ENCOUNTER — Telehealth: Payer: Self-pay | Admitting: Interventional Cardiology

## 2015-10-11 NOTE — Telephone Encounter (Signed)
Spoke with pt and informed him of recommendations per [name].  Advised pt to monitor and record daily BPs.  Pt has appt on 10/2 with [name].  Advised him to bring BP readings to this appt and update [place] on dizziness.  Advised is dizziness does not improve or worsens after holding Losartan to call us sooner.  Pt states he is feeling a little better at this time.  Pt verbalized understanding of instructions and was in agreement with this plan.

## 2015-10-11 NOTE — Telephone Encounter (Signed)
OK to hold losartan for now.  See how BP and dizziness do.

## 2015-10-11 NOTE — Telephone Encounter (Signed)
New message     Pt calling wanting to speak to the nurse. No info given. Please call.

## 2015-10-11 NOTE — Telephone Encounter (Signed)
Pt took 1/2 tab of his Losartan today and about 15 mins later he became dizzy.  Pt felt fine this morning. Pt states BP is 95/50, HR normal. Dizziness goes away as long as pt is sitting down. Pt recently hospitalized for orthostatic dizziness and again for weakness.  Pt does not want to go back to hospital. Pt fell late last week due to dizziness.  Pt states he has home BP cuff now but has a hard time using it so unsure how accurate BP may be. Advised pt to continue monitoring BP and to be careful when changing positions. Advised I will send message to [name] for review and advisement.  Pt very appreciative for assistance.

## 2015-10-12 ENCOUNTER — Ambulatory Visit (INDEPENDENT_AMBULATORY_CARE_PROVIDER_SITE_OTHER): Payer: Medicare HMO | Admitting: Cardiovascular Disease

## 2015-10-12 DIAGNOSIS — Z5181 Encounter for therapeutic drug level monitoring: Secondary | ICD-10-CM

## 2015-10-12 DIAGNOSIS — I482 Chronic atrial fibrillation, unspecified: Secondary | ICD-10-CM

## 2015-10-12 LAB — POCT INR: INR: 1.6

## 2015-10-16 NOTE — ED Provider Notes (Signed)
[facility] DEPT Provider Note   CSN: [id number] Arrival date & time: 10/09/15  1016     History   Chief Complaint Chief Complaint  Patient presents with  . Hypotension    HPI GERSON FAUTH is a 75 y.o. male.  Patient ports lightheadedness with standing up and dizziness over the past several weeks.  He is currently on blood pressure medications and no recent changes to been made to his medications.  This morning his blood pressure was 90 systolic and he was feeling lightheaded when he stood.  On arrival to emergency department his blood pressure is 135/64.  No chest pain shortness of breath.  Denies vertiginous symptoms.  Denies weakness in his arms or legs.  No difficulty with speech.  Friend reports no confusion.  Patient reports normal appetite otherwise.      Past Medical History:  Diagnosis Date  . Atrial fibrillation ([place])   . CAD (coronary artery disease) 1996   status post PCI of the RCA   . Congestive heart failure, unspecified   . COPD (chronic obstructive pulmonary disease) (HCC)    Pt on home O2 at night and PRN, unsure of date of diagnosis.  . Diabetes mellitus, type 2 ([place])   . DJD (degenerative joint disease)   . GERD (gastroesophageal reflux disease)   . Gout   . HTN (hypertension)   . Ischemic dilated cardiomyopathy   . Nephrolithiasis   . Other primary cardiomyopathies   . Personal history of DVT (deep vein thrombosis)   . Prostatitis     Patient Active Problem List   Diagnosis Date Noted  . Lower GI bleed 09/11/2015  . Bright red blood per rectum 09/11/2015  . Community acquired pneumonia 08/24/2015  . Acute exacerbation of chronic obstructive pulmonary disease (COPD) ([place]) 03/05/2015  . Acute renal failure superimposed on stage 3 chronic kidney disease ([place]) 03/05/2015  . COPD with acute exacerbation ([place]) 03/05/2015  . Renal failure (ARF), acute on chronic (HCC)   . Stomach ache 01/18/2015  . COPD exacerbation ([place])   . AKI (acute  kidney injury) ([place]) 12/26/2014  . BPH (benign prostatic hyperplasia) 12/25/2014  . FTT (failure to thrive) in adult 12/25/2014  . Encounter for therapeutic drug monitoring 08/05/2014  . Pulmonary nodules 12/23/2013  . Hemoptysis   . Dyspnea   . Acute on chronic systolic CHF (congestive heart failure) ([place])   . Chronic kidney disease, stage 3   . Coronary artery disease involving native coronary artery of native heart without angina pectoris   . Paroxysmal atrial fibrillation (HCC)   . Frequent PVCs   . Hypoxia   . Acute on chronic respiratory failure with hypoxia ([place])   . Chronic obstructive pulmonary disease with acute exacerbation ([place])   . COPD (chronic obstructive pulmonary disease) ([place]) 06/16/2013  . Lipoma of neck 09/11/2011  . 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 Coronary artery disease 01/30/2011  . Chronic atrial fibrillation ([place]) 01/30/2011  . Chronic systolic CHF (congestive heart failure) ([place])   . Other primary cardiomyopathies   . Diverticulosis of colon with hemorrhage 08/28/2010    Past Surgical History:  Procedure Laterality Date  . BACK SURGERY    . CARDIAC DEFIBRILLATOR PLACEMENT    . CORONARY STENT PLACEMENT    . PACEMAKER INSERTION         Home Medications    Prior to Admission medications   Medication Sig Start Date End Date Taking? Authorizing Provider  acetaminophen (TYLENOL) 500 MG tablet  Take 500-1,000 mg by mouth every 6 (six) hours as needed for mild pain.   Yes Historical Provider, MD  albuterol (ACCUNEB) 1.25 MG/3ML nebulizer solution Take 3 mLs by nebulization 4 (four) times daily. 08/21/15  Yes Historical Provider, MD  alfuzosin (UROXATRAL) 10 MG 24 hr tablet Take 10 mg by mouth daily with breakfast.    Yes Historical Provider, MD  allopurinol (ZYLOPRIM) 100 MG tablet Take 100 mg by mouth daily.   Yes Historical Provider, MD  atorvastatin (LIPITOR) 10 MG tablet Take 1 tablet (10 mg total) by mouth at  bedtime. 08/03/14  Yes Jettie Booze, MD  carvedilol (COREG) 12.5 MG tablet Take 1 tablet (12.5 mg total) by mouth 2 (two) times daily with a meal. 03/03/15  Yes Jettie Booze, MD  cetirizine (ZYRTEC) 10 MG tablet Take 10 mg by mouth daily.   Yes Historical Provider, MD  digoxin (LANOXIN) 0.125 MG tablet Take 0.5 tablets (0.0625 mg total) by mouth daily. 06/23/15  Yes Jettie Booze, MD  fluticasone (FLONASE) 50 MCG/ACT nasal spray Place 2 sprays into both nostrils daily as needed for allergies.  04/07/13  Yes Historical Provider, MD  mometasone-formoterol (DULERA) 100-5 MCG/ACT AERO Inhale 2 puffs into the lungs 2 (two) times daily. 09/13/15  Yes Geradine Girt, DO  potassium chloride (K-DUR) 10 MEQ tablet Take 1 tablet (10 mEq total) by mouth 2 (two) times daily. 09/13/15  Yes Jessica U Vann, DO  PROAIR HFA 108 (90 BASE) MCG/ACT inhaler INHALE 2 PUFFS EVERY 4 HOURS AS NEEDED FOR WHEEZING OR SHORTNESS OF BREATH. 09/05/14  Yes Collene Gobble, MD  warfarin (COUMADIN) 1 MG tablet Take 2 tabs daily except 3 tabs on Wednesday & Saturday or as directed by Coumadin Clinic 09/13/15  Yes Jessica U Vann, DO  furosemide (LASIX) 40 MG tablet Take 1 tablet (40 mg total) by mouth daily. 10/09/15   Jola Schmidt, MD  losartan (COZAAR) 25 MG tablet Take 1 tablet (25 mg total) by mouth daily. 10/09/15   Jola Schmidt, MD    Family History Family History  Problem Relation Age of Onset  . Heart disease Father   . Cancer Father     LUNG  . Colon cancer Mother   . Stroke Paternal Uncle   . Heart attack Neg Hx   . Hyperlipidemia Neg Hx   . Hypertension Neg Hx     Social History Social History  Substance Use Topics  . Smoking status: Former Smoker    Packs/day: 4.00    Years: 50.00    Types: Cigarettes    Quit date: 01/22/1980  . Smokeless tobacco: Never Used  . Alcohol use No     Comment: denies     Allergies   Benadryl [diphenhydramine hcl]   Review of Systems Review of Systems  All other  systems reviewed and are negative.    Physical Exam Updated Vital Signs BP 125/82   Pulse 84   Temp 98.5 F (36.9 C) (Oral)   Resp 18   SpO2 97%   Physical Exam  Constitutional: He is oriented to person, place, and time. He appears well-developed and well-nourished.  HENT:  Head: Normocephalic and atraumatic.  Eyes: EOM are normal. Pupils are equal, round, and reactive to light.  Neck: Normal range of motion.  Cardiovascular: Normal rate, regular rhythm, normal heart sounds and intact distal pulses.   Pulmonary/Chest: Effort normal and breath sounds normal. No respiratory distress.  Abdominal: Soft. He exhibits no distension. There is  no tenderness.  Musculoskeletal: Normal range of motion.  Neurological: He is alert and oriented to person, place, and time.  5/5 strength in major muscle groups of  bilateral upper and lower extremities. Speech normal. No facial asymetry.   Skin: Skin is warm and dry.  Psychiatric: He has a normal mood and affect. Judgment normal.  Nursing note and vitals reviewed.    ED Treatments / Results  Labs (all labs ordered are listed, but only abnormal results are displayed) Labs Reviewed  CBC WITH DIFFERENTIAL/PLATELET - Abnormal; Notable for the following:       Result Value   RBC 3.72 (*)    Hemoglobin 12.6 (*)    HCT 37.6 (*)    MCV 101.1 (*)    Platelets 121 (*)    All other components within normal limits  BASIC METABOLIC PANEL - Abnormal; Notable for the following:    BUN 26 (*)    Creatinine, Ser 1.68 (*)    GFR calc non Af Amer 38 (*)    GFR calc Af Amer 44 (*)    All other components within normal limits  PROTIME-INR - Abnormal; Notable for the following:    Prothrombin Time 22.9 (*)    All other components within normal limits  URINALYSIS, ROUTINE W REFLEX MICROSCOPIC (NOT AT [facility])    EKG  EKG Interpretation None       Radiology No results found.  Procedures Procedures (including critical care time)  Medications  Ordered in ED Medications  0.9 %  sodium chloride infusion (0 mLs Intravenous Stopped 10/09/15 1300)     Initial Impression / Assessment and Plan / ED Course  I have reviewed the triage vital signs and the nursing notes.  Pertinent labs & imaging results that were available during my care of the patient were reviewed by me and considered in my medical decision making (see chart for details).  Clinical Course    Able stand the bedside.  Slightly lightheaded.  Feels better after IV fluids.  I will decrease the patient's Lasix to once a day from twice a day.  I will also cut the patient's losartan from twice a day to once a day.  No change will be made in his Coreg.  Primary care follow-up.  Patient understands to return to the ER for new or worsening symptoms  Final Clinical Impressions(s) / ED Diagnoses   Final diagnoses:  Weakness  Orthostasis    New Prescriptions Discharge Medication List as of 10/09/2015  2:19 PM       Jola Schmidt, MD 10/16/15 [phone]

## 2015-10-18 ENCOUNTER — Ambulatory Visit (INDEPENDENT_AMBULATORY_CARE_PROVIDER_SITE_OTHER): Payer: Medicare HMO | Admitting: Interventional Cardiology

## 2015-10-18 DIAGNOSIS — I482 Chronic atrial fibrillation, unspecified: Secondary | ICD-10-CM

## 2015-10-18 DIAGNOSIS — Z5181 Encounter for therapeutic drug level monitoring: Secondary | ICD-10-CM

## 2015-10-18 LAB — POCT INR: INR: 2

## 2015-10-22 NOTE — Progress Notes (Signed)
Cardiology Office Note   Date:  10/23/2015   ID:  Troy [name], DOB 04-Mar-1940, MRN [id number]  PCP:  Troy Merino, MD    No chief complaint on file. Chronic systolic heart failure   Wt Readings from Last 3 Encounters:  10/23/15 170 lb (77.1 kg)  10/05/15 159 lb (72.1 kg)  09/13/15 159 lb 9.6 oz (72.4 kg)       History of Present Illness: Troy [name] is a 75 y.o. male  who presents for chronic systolic HF.   He has a mixed CM, chronic class 2 CHF, s/p biv-ICD-PLACED 2012, chb, and PAF. CAD, PCI of RCA in 1996 .   Last Echo 11/22/13 with EF 20-25%.   Hospitalized in 12/16 for acute on chronic systolic HF. Was neg 5 L with diuresis- discharged with oxygen and to SNF for rehab. He is now back home and doing well.  He feels he gained more strength form being at home than going to rehab. Pt lives alone. He is on nebulizers for his COPD and his SOB is controlled. No chest pain. No edema. His energy has improved a great deal.   His appetite has improved with lower dose of dig, but still not what it used to be. The level was 1.8 so I decreased. Nausea resolved.  Hospitalized in 8/17 for presumed Lower GI bleed form diverticulosis.  No colonoscopy was done.    Overall, he feels that he is better.  He is not walking much.  No chest pain.  No further bleeding problems.  Uses just one pillow.  He has some DOE.    He lives in an apartment and he is told he may have to move.      Past Medical History:  Diagnosis Date  . Atrial fibrillation ([place])   . CAD (coronary artery disease) 1996   status post PCI of the RCA   . Congestive heart failure, unspecified   . COPD (chronic obstructive pulmonary disease) (HCC)    Pt on home O2 at night and PRN, unsure of date of diagnosis.  . Diabetes mellitus, type 2 ([place])   . DJD (degenerative joint disease)   . GERD (gastroesophageal reflux disease)   . Gout   . HTN (hypertension)   . Ischemic dilated  cardiomyopathy ([place])   . Nephrolithiasis   . Other primary cardiomyopathies   . Personal history of DVT (deep vein thrombosis)   . Prostatitis     Past Surgical History:  Procedure Laterality Date  . BACK SURGERY    . CARDIAC DEFIBRILLATOR PLACEMENT    . CORONARY STENT PLACEMENT    . PACEMAKER INSERTION       Current Outpatient Prescriptions  Medication Sig Dispense Refill  . acetaminophen (TYLENOL) 500 MG tablet Take 500-1,000 mg by mouth every 6 (six) hours as needed for mild pain.    Marland Kitchen albuterol (ACCUNEB) 1.25 MG/3ML nebulizer solution Take 3 mLs by nebulization 4 (four) times daily.  2  . alfuzosin (UROXATRAL) 10 MG 24 hr tablet Take 10 mg by mouth daily with breakfast.     . allopurinol (ZYLOPRIM) 100 MG tablet Take 100 mg by mouth daily.    Marland Kitchen atorvastatin (LIPITOR) 10 MG tablet Take 1 tablet (10 mg total) by mouth at bedtime. 90 tablet 1  . carvedilol (COREG) 12.5 MG tablet Take 1 tablet (12.5 mg total) by mouth 2 (two) times daily with a meal. 60 tablet 6  . cetirizine (ZYRTEC) 10 MG tablet Take 10  mg by mouth daily.    . digoxin (LANOXIN) 0.125 MG tablet Take 0.5 tablets (0.0625 mg total) by mouth daily. 45 tablet 3  . fluticasone (FLONASE) 50 MCG/ACT nasal spray Place 2 sprays into both nostrils daily as needed for allergies.     . furosemide (LASIX) 40 MG tablet Take 1 tablet (40 mg total) by mouth daily. 30 tablet 0  . losartan (COZAAR) 25 MG tablet Take 1 tablet (25 mg total) by mouth daily. 30 tablet 0  . mometasone-formoterol (DULERA) 100-5 MCG/ACT AERO Inhale 2 puffs into the lungs 2 (two) times daily. 1 Inhaler 2  . potassium chloride (K-DUR) 10 MEQ tablet Take 1 tablet (10 mEq total) by mouth 2 (two) times daily.    Marland Kitchen PROAIR HFA 108 (90 BASE) MCG/ACT inhaler INHALE 2 PUFFS EVERY 4 HOURS AS NEEDED FOR WHEEZING OR SHORTNESS OF BREATH. 8.5 g 1  . warfarin (COUMADIN) 1 MG tablet Take 2 tabs daily except 3 tabs on Wednesday & Saturday or as directed by Coumadin Clinic      No current facility-administered medications for this visit.     Allergies:   Benadryl [diphenhydramine hcl]    Social History:  The patient  reports that he quit smoking about 35 years ago. His smoking use included Cigarettes. He has a 200.00 pack-year smoking history. He has never used smokeless tobacco. He reports that he does not drink alcohol or use drugs.   Family History:  The patient's family history includes Cancer in his father; Colon cancer in his mother; Heart disease in his father; Stroke in his paternal uncle.    ROS:  Please see the history of present illness.   Otherwise, review of systems are positive for DOE.   All other systems are reviewed and negative.    PHYSICAL EXAM: VS:  BP 130/60   Pulse 66   Ht 5\' 6"  (1.676 m)   Wt 170 lb (77.1 kg)   BMI 27.44 kg/m  , BMI Body mass index is 27.44 kg/m. GEN: Well nourished, well developed, in no acute distress  HEENT: normal  Neck: no JVD, carotid bruits, or masses Cardiac: RRR; no murmurs, rubs, or gallops,no edema  Respiratory:  clear to auscultation bilaterally, normal work of breathing GI: soft, nontender, nondistended, + BS MS: no deformity or atrophy  Skin: warm and dry, no rash, scattered varicose veins Neuro:  Strength and sensation are intact Psych: euthymic mood, full affect   EKG:   The ekg ordered in 9/17 demonstrates paced rhythm   Recent Labs: 03/05/2015: B Natriuretic Peptide 220.7 03/07/2015: Magnesium 2.5 10/05/2015: ALT 15 10/09/2015: BUN 26; Creatinine, Ser 1.68; Hemoglobin 12.6; Platelets 121; Potassium 4.5; Sodium 141   Lipid Panel    Component Value Date/Time   CHOL 102 08/05/2014 0847   TRIG 84.0 08/05/2014 0847   HDL 32.80 (L) 08/05/2014 0847   CHOLHDL 3 08/05/2014 0847   VLDL 16.8 08/05/2014 0847   LDLCALC 52 08/05/2014 0847     Other studies Reviewed: Additional studies/ records that were reviewed today with results demonstrating: echo result.   ASSESSMENT AND  PLAN:  1. Chronic systolic heart failure:  Appears euvolemic. Continue current dose of diuretics.   Stressed the importance of daily weights. 2. CAD:  Status post PCI of RCA many years ago. No anginal symptoms at this time. Continue aggressive secondary prevention. Lipids well controlled.  Some DOE, but this is likely due to a combination of factors including COPD, deconditioning.  3. CRI:  Stable.  Avoid nephrotoxins.  4. AFib:   Sometimes difficult to tell by ECG since he has a paced rhythm. There is underlying atrial fibrillation. No bleeding issues with his current anticoagulation, since the GI issue in August.  COntinue Coumadin for now.     Current medicines are reviewed at length with the patient today.  The patient concerns regarding his medicines were addressed.  The following changes have been made:  No change  Labs/ tests ordered today include:  No orders of the defined types were placed in this encounter.   Recommend 150 minutes/week of aerobic exercise Low fat, low carb, high fiber diet recommended  Disposition:   FU in 6 months   Signed, Larae Grooms, MD  10/23/2015 11:53 AM    [facility] [place], [place], [place]  [address] Phone: [phone]; Fax: [phone]

## 2015-10-23 ENCOUNTER — Encounter: Payer: Self-pay | Admitting: Interventional Cardiology

## 2015-10-23 ENCOUNTER — Ambulatory Visit (INDEPENDENT_AMBULATORY_CARE_PROVIDER_SITE_OTHER): Payer: Medicare HMO | Admitting: Interventional Cardiology

## 2015-10-23 VITALS — BP 130/60 | HR 66 | Ht 66.0 in | Wt 170.0 lb

## 2015-10-23 DIAGNOSIS — I2510 Atherosclerotic heart disease of native coronary artery without angina pectoris: Secondary | ICD-10-CM | POA: Diagnosis not present

## 2015-10-23 DIAGNOSIS — I482 Chronic atrial fibrillation, unspecified: Secondary | ICD-10-CM

## 2015-10-23 DIAGNOSIS — I5022 Chronic systolic (congestive) heart failure: Secondary | ICD-10-CM | POA: Diagnosis not present

## 2015-10-23 DIAGNOSIS — N183 Chronic kidney disease, stage 3 unspecified: Secondary | ICD-10-CM

## 2015-10-23 NOTE — Patient Instructions (Signed)
Medication Instructions:  Same-no changes  Labwork: None  Testing/Procedures: None  Follow-Up: Your physician wants you to follow-up in: 6 months. You will receive a reminder letter in the mail two months in advance. If you don't receive a letter, please call our office to schedule the follow-up appointment.      If you need a refill on your cardiac medications before your next appointment, please call your pharmacy.   

## 2015-10-26 ENCOUNTER — Ambulatory Visit (INDEPENDENT_AMBULATORY_CARE_PROVIDER_SITE_OTHER): Payer: Medicare HMO | Admitting: Internal Medicine

## 2015-10-26 DIAGNOSIS — I482 Chronic atrial fibrillation, unspecified: Secondary | ICD-10-CM

## 2015-10-26 DIAGNOSIS — Z5181 Encounter for therapeutic drug level monitoring: Secondary | ICD-10-CM

## 2015-10-26 LAB — POCT INR: INR: 1.9

## 2015-11-09 ENCOUNTER — Ambulatory Visit (INDEPENDENT_AMBULATORY_CARE_PROVIDER_SITE_OTHER): Payer: Medicare HMO

## 2015-11-09 DIAGNOSIS — I480 Paroxysmal atrial fibrillation: Secondary | ICD-10-CM

## 2015-11-09 DIAGNOSIS — I482 Chronic atrial fibrillation, unspecified: Secondary | ICD-10-CM

## 2015-11-09 DIAGNOSIS — Z5181 Encounter for therapeutic drug level monitoring: Secondary | ICD-10-CM | POA: Diagnosis not present

## 2015-11-09 DIAGNOSIS — I82409 Acute embolism and thrombosis of unspecified deep veins of unspecified lower extremity: Secondary | ICD-10-CM

## 2015-11-09 LAB — POCT INR: INR: 1.8

## 2015-11-14 ENCOUNTER — Encounter: Payer: Self-pay | Admitting: Internal Medicine

## 2015-11-24 ENCOUNTER — Ambulatory Visit (INDEPENDENT_AMBULATORY_CARE_PROVIDER_SITE_OTHER): Payer: Medicare HMO | Admitting: Internal Medicine

## 2015-11-24 ENCOUNTER — Encounter (INDEPENDENT_AMBULATORY_CARE_PROVIDER_SITE_OTHER): Payer: Self-pay

## 2015-11-24 ENCOUNTER — Encounter: Payer: Self-pay | Admitting: Internal Medicine

## 2015-11-24 ENCOUNTER — Ambulatory Visit (INDEPENDENT_AMBULATORY_CARE_PROVIDER_SITE_OTHER): Payer: Medicare HMO | Admitting: *Deleted

## 2015-11-24 VITALS — BP 130/58 | HR 66 | Ht 66.0 in | Wt 167.1 lb

## 2015-11-24 DIAGNOSIS — I82409 Acute embolism and thrombosis of unspecified deep veins of unspecified lower extremity: Secondary | ICD-10-CM | POA: Diagnosis not present

## 2015-11-24 DIAGNOSIS — I480 Paroxysmal atrial fibrillation: Secondary | ICD-10-CM

## 2015-11-24 DIAGNOSIS — I482 Chronic atrial fibrillation, unspecified: Secondary | ICD-10-CM

## 2015-11-24 DIAGNOSIS — I5022 Chronic systolic (congestive) heart failure: Secondary | ICD-10-CM | POA: Diagnosis not present

## 2015-11-24 DIAGNOSIS — Z5181 Encounter for therapeutic drug level monitoring: Secondary | ICD-10-CM

## 2015-11-24 LAB — POCT INR: INR: 2

## 2015-11-24 NOTE — Progress Notes (Signed)
HPI Mr. Bauer returns today for followup. He is a pleasant 75 yo man with a mixed CM, chronic class 2 CHF, s/p ICD, and PAF. In the interim he has had more CHF symptoms. He has mild peripheral edema due to venous insufficiency. He denies syncope or ICD shock. He does not feel palpitations. He has a recall device.  Allergies  Allergen Reactions  . Benadryl [Diphenhydramine Hcl] Nausea And Vomiting     Current Outpatient Prescriptions  Medication Sig Dispense Refill  . acetaminophen (TYLENOL) 500 MG tablet Take 500-1,000 mg by mouth every 6 (six) hours as needed for mild pain.    Marland Kitchen albuterol (ACCUNEB) 1.25 MG/3ML nebulizer solution Take 3 mLs by nebulization 4 (four) times daily.  2  . alfuzosin (UROXATRAL) 10 MG 24 hr tablet Take 10 mg by mouth daily with breakfast.     . allopurinol (ZYLOPRIM) 100 MG tablet Take 100 mg by mouth daily.    Marland Kitchen atorvastatin (LIPITOR) 10 MG tablet Take 1 tablet (10 mg total) by mouth at bedtime. 90 tablet 1  . carvedilol (COREG) 12.5 MG tablet Take 1 tablet (12.5 mg total) by mouth 2 (two) times daily with a meal. 60 tablet 6  . cetirizine (ZYRTEC) 10 MG tablet Take 10 mg by mouth daily.    . digoxin (LANOXIN) 0.125 MG tablet Take 0.5 tablets (0.0625 mg total) by mouth daily. 45 tablet 3  . fluticasone (FLONASE) 50 MCG/ACT nasal spray Place 2 sprays into both nostrils daily as needed for allergies.     . furosemide (LASIX) 40 MG tablet Take 1 tablet (40 mg total) by mouth daily. 30 tablet 0  . losartan (COZAAR) 25 MG tablet Take 1 tablet (25 mg total) by mouth daily. 30 tablet 0  . mometasone-formoterol (DULERA) 100-5 MCG/ACT AERO Inhale 2 puffs into the lungs 2 (two) times daily. 1 Inhaler 2  . potassium chloride (K-DUR) 10 MEQ tablet Take 1 tablet (10 mEq total) by mouth 2 (two) times daily.    Marland Kitchen PROAIR HFA 108 (90 BASE) MCG/ACT inhaler INHALE 2 PUFFS EVERY 4 HOURS AS NEEDED FOR WHEEZING OR SHORTNESS OF BREATH. 8.5 g 1  . warfarin (COUMADIN) 1 MG tablet Take 2  tabs daily except 3 tabs on Wednesday & Saturday or as directed by Coumadin Clinic     No current facility-administered medications for this visit.      Past Medical History:  Diagnosis Date  . Atrial fibrillation ([place])   . CAD (coronary artery disease) 1996   status post PCI of the RCA   . Congestive heart failure, unspecified   . COPD (chronic obstructive pulmonary disease) (HCC)    Pt on home O2 at night and PRN, unsure of date of diagnosis.  . Diabetes mellitus, type 2 ([place])   . DJD (degenerative joint disease)   . GERD (gastroesophageal reflux disease)   . Gout   . HTN (hypertension)   . 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 Nephrolithiasis   . Other primary cardiomyopathies   . Personal history of DVT (deep vein thrombosis)   . Prostatitis     ROS:   All systems reviewed and negative except as noted in the HPI.   Past Surgical History:  Procedure Laterality Date  . BACK SURGERY    . CARDIAC DEFIBRILLATOR PLACEMENT    . CORONARY STENT PLACEMENT    . PACEMAKER INSERTION       Family History  Problem Relation Age of Onset  . Heart disease Father   .  Cancer Father     LUNG  . Colon cancer Mother   . Stroke Paternal Uncle   . Heart attack Neg Hx   . Hyperlipidemia Neg Hx   . Hypertension Neg Hx      Social History   Social History  . Marital status: Single    Spouse name: N/A  . Number of children: 0  . Years of education: N/A   Occupational History  . Retired from [employer]    Social History Main Topics  . Smoking status: Former Smoker    Packs/day: 4.00    Years: 50.00    Types: Cigarettes    Quit date: 01/22/1980  . Smokeless tobacco: Never Used  . Alcohol use No     Comment: denies  . Drug use: No  . Sexual activity: No   Other Topics Concern  . Not on file   Social History Narrative   Lives alone.  Single.  No children.  Ambulates with a cane.  Has a deceased brother and is estranged from sisters.     BP (!) 130/58    Pulse 66   Ht 5\' 6"  (1.676 m)   Wt 167 lb 1.9 oz (75.8 kg)   BMI 26.97 kg/m   Physical Exam:  stable appearing 75 yo man, NAD HEENT: Unremarkable Neck:  7 cm JVD, no thyromegally, 4 cm soft lipoma Back:  No CVA tenderness Lungs:  Clear with no wheezes, rales, or rhonchi HEART:  IRegular rate rhythm, no murmurs, no rubs, no clicks Abd:  soft, positive bowel sounds, no organomegally, no rebound, no guarding Ext:  2 plus pulses, no edema, no cyanosis, no clubbing Skin:  No rashes no nodules Neuro:  CN II through XII intact, motor grossly intact   DEVICE  Normal device function.  See PaceArt for details.   Assess/Plan: 1. Chronic systolic heart failure - his symptoms are well compensated. He will continue his current meds. 2. BiV ICD - His [employer] Jude device is working normally. He will continue his current meds. 3. PAF - his symptoms are well controlled. Will continue his current meds.  Mikle Bosworth.D.

## 2015-11-24 NOTE — Patient Instructions (Signed)
Medication Instructions:  Your physician recommends that you continue on your current medications as directed. Please refer to the Current Medication list given to you today.   Labwork: None Ordered   Testing/Procedures: None Ordered   Follow-Up: Your physician wants you to follow-up in: 1 year with Dr. Taylor. You will receive a reminder letter in the mail two months in advance. If you don't receive a letter, please call our office to schedule the follow-up appointment.  Remote monitoring is used to monitor your ICD from home. This monitoring reduces the number of office visits required to check your device to one time per year. It allows us to keep an eye on the functioning of your device to ensure it is working properly. You are scheduled for a device check from home on 02/26/16. You may send your transmission at any time that day. If you have a wireless device, the transmission will be sent automatically. After your physician reviews your transmission, you will receive a postcard with your next transmission date.    Any Other Special Instructions Will Be Listed Below (If Applicable).     If you need a refill on your cardiac medications before your next appointment, please call your pharmacy.   

## 2015-11-28 LAB — CUP PACEART INCLINIC DEVICE CHECK
Brady Statistic RV Percent Paced: 88 %
Date Time Interrogation Session: 20171103160008
HighPow Impedance: 46.5776
Implantable Lead Implant Date: 20120801
Implantable Lead Implant Date: 20120801
Implantable Lead Location: 753858
Implantable Lead Location: 753860
Implantable Pulse Generator Implant Date: 20120801
Lead Channel Impedance Value: 400 Ohm
Lead Channel Impedance Value: 487.5 Ohm
Lead Channel Pacing Threshold Pulse Width: 0.5 ms
Lead Channel Pacing Threshold Pulse Width: 0.5 ms
Lead Channel Sensing Intrinsic Amplitude: 11.7 mV
Lead Channel Sensing Intrinsic Amplitude: 4 mV
Lead Channel Setting Pacing Amplitude: 2 V
Lead Channel Setting Pacing Pulse Width: 0.5 ms
Lead Channel Setting Pacing Pulse Width: 0.5 ms
MDC IDC LEAD IMPLANT DT: 20120801
MDC IDC LEAD LOCATION: 753859
MDC IDC MSMT BATTERY REMAINING LONGEVITY: 34 mo
MDC IDC MSMT LEADCHNL LV IMPEDANCE VALUE: 850 Ohm
MDC IDC MSMT LEADCHNL LV PACING THRESHOLD AMPLITUDE: 1 V
MDC IDC MSMT LEADCHNL RA PACING THRESHOLD AMPLITUDE: 0.75 V
MDC IDC MSMT LEADCHNL RA PACING THRESHOLD PULSEWIDTH: 0.5 ms
MDC IDC MSMT LEADCHNL RV PACING THRESHOLD AMPLITUDE: 0.75 V
MDC IDC SET LEADCHNL RA PACING AMPLITUDE: 1.625
MDC IDC SET LEADCHNL RV PACING AMPLITUDE: 2 V
MDC IDC SET LEADCHNL RV SENSING SENSITIVITY: 0.5 mV
MDC IDC STAT BRADY RA PERCENT PACED: 60 %
Pulse Gen Serial Number: 7001284

## 2015-11-29 ENCOUNTER — Telehealth: Payer: Self-pay

## 2015-11-29 NOTE — Telephone Encounter (Signed)
Left message to call back to make new pt appt with dr nelson. 

## 2015-12-05 ENCOUNTER — Encounter (HOSPITAL_COMMUNITY): Payer: Self-pay | Admitting: Emergency Medicine

## 2015-12-05 ENCOUNTER — Emergency Department (HOSPITAL_COMMUNITY)
Admission: EM | Admit: 2015-12-05 | Discharge: 2015-12-05 | Disposition: A | Discharge status: Home / Self Care | Payer: Medicare HMO | Attending: Emergency Medicine | Admitting: Emergency Medicine

## 2015-12-05 ENCOUNTER — Emergency Department (HOSPITAL_COMMUNITY): Payer: Medicare HMO

## 2015-12-05 DIAGNOSIS — R0981 Nasal congestion: Secondary | ICD-10-CM | POA: Diagnosis not present

## 2015-12-05 DIAGNOSIS — I130 Hypertensive heart and chronic kidney disease with heart failure and stage 1 through stage 4 chronic kidney disease, or unspecified chronic kidney disease: Secondary | ICD-10-CM | POA: Diagnosis not present

## 2015-12-05 DIAGNOSIS — R05 Cough: Secondary | ICD-10-CM | POA: Diagnosis present

## 2015-12-05 DIAGNOSIS — E1122 Type 2 diabetes mellitus with diabetic chronic kidney disease: Secondary | ICD-10-CM | POA: Insufficient documentation

## 2015-12-05 DIAGNOSIS — Z79899 Other long term (current) drug therapy: Secondary | ICD-10-CM | POA: Diagnosis not present

## 2015-12-05 DIAGNOSIS — I2510 Atherosclerotic heart disease of native coronary artery without angina pectoris: Secondary | ICD-10-CM | POA: Insufficient documentation

## 2015-12-05 DIAGNOSIS — J441 Chronic obstructive pulmonary disease with (acute) exacerbation: Secondary | ICD-10-CM | POA: Diagnosis not present

## 2015-12-05 DIAGNOSIS — I5023 Acute on chronic systolic (congestive) heart failure: Secondary | ICD-10-CM | POA: Insufficient documentation

## 2015-12-05 DIAGNOSIS — N183 Chronic kidney disease, stage 3 (moderate): Secondary | ICD-10-CM | POA: Insufficient documentation

## 2015-12-05 DIAGNOSIS — Z7901 Long term (current) use of anticoagulants: Secondary | ICD-10-CM | POA: Diagnosis not present

## 2015-12-05 DIAGNOSIS — R059 Cough, unspecified: Secondary | ICD-10-CM

## 2015-12-05 DIAGNOSIS — Z87891 Personal history of nicotine dependence: Secondary | ICD-10-CM | POA: Insufficient documentation

## 2015-12-05 NOTE — ED Provider Notes (Signed)
[facility] DEPT Provider Note   CSN: [id number] Arrival date & time: 12/05/15  1117  By signing my name below, I, Higinio Plan, attest that this documentation has been prepared under the direction and in the presence of Daleen Bo, MD . Electronically Signed: Higinio Plan, Scribe. 12/05/2015. 12:31 PM.  History   Chief Complaint Chief Complaint  Patient presents with  . Cough   The history is provided by the patient. No language interpreter was used.   HPI Comments: Troy [name] is a 75 y.o. male with PMHx of CAD, COPD and DM, who presents to the Emergency Department complaining of gradually worsening, dry cough and congestion that began 4 days ago. Pt denies chest pain, shortness of breath, abdominal pain, back pain, dizziness, weakness, fever and leg swelling. He is taking his usual medications. There are no other known modifying factors.  PCP: Dr. Jonelle Sidle   Past Medical History:  Diagnosis Date  . Atrial fibrillation ([place])   . CAD (coronary artery disease) 1996   status post PCI of the RCA   . Congestive heart failure, unspecified   . COPD (chronic obstructive pulmonary disease) (HCC)    Pt on home O2 at night and PRN, unsure of date of diagnosis.  . Diabetes mellitus, type 2 ([place])   . DJD (degenerative joint disease)   . GERD (gastroesophageal reflux disease)   . Gout   . HTN (hypertension)   . 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 Nephrolithiasis   . Other primary cardiomyopathies   . Personal history of DVT (deep vein thrombosis)   . Prostatitis     Patient Active Problem List   Diagnosis Date Noted  . Lower GI bleed 09/11/2015  . Bright red blood per rectum 09/11/2015  . Community acquired pneumonia 08/24/2015  . Acute exacerbation of chronic obstructive pulmonary disease (COPD) ([place]) 03/05/2015  . Acute renal failure superimposed on stage 3 chronic kidney disease ([place]) 03/05/2015  . COPD with acute exacerbation ([place]) 03/05/2015  . Renal failure  (ARF), acute on chronic (HCC)   . Stomach ache 01/18/2015  . COPD exacerbation ([place])   . AKI (acute kidney injury) ([place]) 12/26/2014  . BPH (benign prostatic hyperplasia) 12/25/2014  . FTT (failure to thrive) in adult 12/25/2014  . Encounter for therapeutic drug monitoring 08/05/2014  . Pulmonary nodules 12/23/2013  . Hemoptysis   . Dyspnea   . Acute on chronic systolic CHF (congestive heart failure) ([place])   . Chronic kidney disease, stage 3   . Coronary artery disease involving native coronary artery of native heart without angina pectoris   . Paroxysmal atrial fibrillation (HCC)   . Frequent PVCs   . Hypoxia   . Acute on chronic respiratory failure with hypoxia ([place])   . Chronic obstructive pulmonary disease with acute exacerbation ([place])   . COPD (chronic obstructive pulmonary disease) ([place]) 06/16/2013  . Lipoma of neck 09/11/2011  . 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 Coronary artery disease 01/30/2011  . Chronic atrial fibrillation ([place]) 01/30/2011  . Chronic systolic CHF (congestive heart failure) ([place])   . Other primary cardiomyopathies   . Diverticulosis of colon with hemorrhage 08/28/2010    Past Surgical History:  Procedure Laterality Date  . BACK SURGERY    . CARDIAC DEFIBRILLATOR PLACEMENT    . CORONARY STENT PLACEMENT    . PACEMAKER INSERTION       Home Medications    Prior to Admission medications   Medication Sig Start Date End Date Taking?  Authorizing Provider  acetaminophen (TYLENOL) 500 MG tablet Take 500-1,000 mg by mouth every 6 (six) hours as needed for mild pain.    Historical Provider, MD  albuterol (ACCUNEB) 1.25 MG/3ML nebulizer solution Take 3 mLs by nebulization 4 (four) times daily. 08/21/15   Historical Provider, MD  alfuzosin (UROXATRAL) 10 MG 24 hr tablet Take 10 mg by mouth daily with breakfast.     Historical Provider, MD  allopurinol (ZYLOPRIM) 100 MG tablet Take 100 mg by mouth daily.    Historical  Provider, MD  atorvastatin (LIPITOR) 10 MG tablet Take 1 tablet (10 mg total) by mouth at bedtime. 08/03/14   Jettie Booze, MD  carvedilol (COREG) 12.5 MG tablet Take 1 tablet (12.5 mg total) by mouth 2 (two) times daily with a meal. 03/03/15   Jettie Booze, MD  cetirizine (ZYRTEC) 10 MG tablet Take 10 mg by mouth daily.    Historical Provider, MD  digoxin (LANOXIN) 0.125 MG tablet Take 0.5 tablets (0.0625 mg total) by mouth daily. 06/23/15   Jettie Booze, MD  fluticasone (FLONASE) 50 MCG/ACT nasal spray Place 2 sprays into both nostrils daily as needed for allergies.  04/07/13   Historical Provider, MD  furosemide (LASIX) 40 MG tablet Take 1 tablet (40 mg total) by mouth daily. 10/09/15   Jola Schmidt, MD  losartan (COZAAR) 25 MG tablet Take 1 tablet (25 mg total) by mouth daily. 10/09/15   Jola Schmidt, MD  mometasone-formoterol [facility]) 100-5 MCG/ACT AERO Inhale 2 puffs into the lungs 2 (two) times daily. 09/13/15   Geradine Girt, DO  potassium chloride (K-DUR) 10 MEQ tablet Take 1 tablet (10 mEq total) by mouth 2 (two) times daily. 09/13/15   Jessica U Vann, DO  PROAIR HFA 108 (90 BASE) MCG/ACT inhaler INHALE 2 PUFFS EVERY 4 HOURS AS NEEDED FOR WHEEZING OR SHORTNESS OF BREATH. 09/05/14   Collene Gobble, MD  warfarin (COUMADIN) 1 MG tablet Take 2 tabs daily except 3 tabs on Wednesday & Saturday or as directed by [facility] 09/13/15   Geradine Girt, DO    Family History Family History  Problem Relation Age of Onset  . Heart disease Father   . Cancer Father     LUNG  . Colon cancer Mother   . Stroke Paternal Uncle   . Heart attack Neg Hx   . Hyperlipidemia Neg Hx   . Hypertension Neg Hx     Social History Social History  Substance Use Topics  . Smoking status: Former Smoker    Packs/day: 4.00    Years: 50.00    Types: Cigarettes    Quit date: 01/22/1980  . Smokeless tobacco: Never Used  . Alcohol use No     Comment: denies     Allergies   Benadryl  [diphenhydramine hcl]   Review of Systems Review of Systems  Constitutional: Negative for fever.  HENT: Positive for congestion.   Respiratory: Positive for cough. Negative for shortness of breath.   Cardiovascular: Negative for chest pain and leg swelling.  Gastrointestinal: Negative for abdominal pain.  Neurological: Negative for dizziness.  All other systems reviewed and are negative.  Physical Exam Updated Vital Signs BP (!) 128/48 (BP Location: Left Arm)   Pulse 60   Temp 97.8 F (36.6 C) (Oral)   Resp 20   Ht 5\' 6"  (1.676 m)   Wt 167 lb (75.8 kg)   SpO2 99%   BMI 26.95 kg/m   Physical Exam  Constitutional: He  is oriented to person, place, and time. He appears well-developed. No distress.  Elderly, frail  HENT:  Head: Normocephalic.  Eyes: EOM are normal.  Neck: Normal range of motion.  Cardiovascular: Normal rate, regular rhythm and normal heart sounds.   Pulmonary/Chest: Effort normal and breath sounds normal. No respiratory distress. He has no wheezes. He exhibits no tenderness.  Abdominal: He exhibits no distension.  Musculoskeletal: Normal range of motion.  Neurological: He is alert and oriented to person, place, and time.  Psychiatric: He has a normal mood and affect.  Nursing note and vitals reviewed.  ED Treatments / Results  Labs (all labs ordered are listed, but only abnormal results are displayed) Labs Reviewed - No data to display  EKG  EKG Interpretation None       Radiology Dg Chest 2 View  Result Date: 12/05/2015 CLINICAL DATA:  75 year old male with cough for 5 days. Initial encounter. Former smoker. EXAM: CHEST  2 VIEW COMPARISON:  08/24/2015 and earlier. FINDINGS: Stable lung volumes. Stable cardiac size and mediastinal contours. Stable left chest cardiac AICD. Visualized tracheal air column is within normal limits. No pneumothorax, pulmonary edema, pleural effusion or confluent pulmonary opacity. Calcified aortic atherosclerosis. No  acute osseous abnormality identified. IMPRESSION: No acute cardiopulmonary abnormality. Calcified aortic atherosclerosis. Electronically Signed   By: Genevie Ann M.D.   On: 12/05/2015 12:24    Procedures Procedures (including critical care time)  Medications Ordered in ED Medications - No data to display  DIAGNOSTIC STUDIES:  Oxygen Saturation is 99% on RA, normal by my interpretation.    COORDINATION OF CARE:  12:28 PM Discussed treatment plan with pt at bedside and pt agreed to plan.  Initial Impression / Assessment and Plan / ED Course  I have reviewed the triage vital signs and the nursing notes.  Pertinent labs & imaging results that were available during my care of the patient were reviewed by me and considered in my medical decision making (see chart for details).  Clinical Course     Medications - No data to display  Patient Vitals for the past 24 hrs:  BP Temp Temp src Pulse Resp SpO2 Height Weight  12/05/15 1146 - - - - - - 5\' 6"  (1.676 m) 167 lb (75.8 kg)  12/05/15 1144 (!) 128/48 97.8 F (36.6 C) Oral 60 20 99 % - -    At D/C Reevaluation with update and discussion. After initial assessment and treatment, an updated evaluation reveals No change in clinical status. Findings discussed with patient and all questions were answered. Ivan Maskell L    I personally performed the services described in this documentation, which was scribed in my presence. The recorded information has been reviewed and is accurate.   Final Clinical Impressions(s) / ED Diagnoses   Final diagnoses:  Cough    Nonspecific cough, without clinical findings for serious bacterial infection metabolic instability or impending vascular collapse.  Irritant cough, cause nonspecific. Doubt pneumonia, or metabolic instability.  Nursing Notes Reviewed/ Care Coordinated Applicable Imaging Reviewed Interpretation of Laboratory Data incorporated into ED treatment  The patient appears reasonably  screened and/or stabilized for discharge and I doubt any other medical condition or other [facility] requiring further screening, evaluation, or treatment in the ED at this time prior to discharge.  Plan: Home Medications- continue usual, try Robitussin or Mucinex; Home Treatments- rest, fluids; return here if the recommended treatment, does not improve the symptoms; Recommended follow up- PCP prn  New Prescriptions Discharge Medication List as of  12/05/2015 12:32 PM       Daleen Bo, MD 12/05/15 1256

## 2015-12-05 NOTE — ED Triage Notes (Signed)
Pt reports non productive cough since Friday.

## 2015-12-05 NOTE — Discharge Instructions (Signed)
Use Robitussin DM, or Mucinex as needed for the congestion and cough

## 2015-12-06 ENCOUNTER — Emergency Department (HOSPITAL_COMMUNITY): Payer: Medicare HMO

## 2015-12-06 ENCOUNTER — Emergency Department (HOSPITAL_COMMUNITY)
Admission: EM | Admit: 2015-12-06 | Discharge: 2015-12-06 | Disposition: A | Discharge status: Home / Self Care | Payer: Medicare HMO | Attending: Emergency Medicine | Admitting: Emergency Medicine

## 2015-12-06 DIAGNOSIS — Z79899 Other long term (current) drug therapy: Secondary | ICD-10-CM | POA: Diagnosis not present

## 2015-12-06 DIAGNOSIS — Z955 Presence of coronary angioplasty implant and graft: Secondary | ICD-10-CM | POA: Diagnosis not present

## 2015-12-06 DIAGNOSIS — N183 Chronic kidney disease, stage 3 (moderate): Secondary | ICD-10-CM | POA: Diagnosis not present

## 2015-12-06 DIAGNOSIS — I5023 Acute on chronic systolic (congestive) heart failure: Secondary | ICD-10-CM | POA: Insufficient documentation

## 2015-12-06 DIAGNOSIS — Z7901 Long term (current) use of anticoagulants: Secondary | ICD-10-CM | POA: Insufficient documentation

## 2015-12-06 DIAGNOSIS — J441 Chronic obstructive pulmonary disease with (acute) exacerbation: Secondary | ICD-10-CM | POA: Insufficient documentation

## 2015-12-06 DIAGNOSIS — R0602 Shortness of breath: Secondary | ICD-10-CM | POA: Diagnosis present

## 2015-12-06 DIAGNOSIS — I130 Hypertensive heart and chronic kidney disease with heart failure and stage 1 through stage 4 chronic kidney disease, or unspecified chronic kidney disease: Secondary | ICD-10-CM | POA: Insufficient documentation

## 2015-12-06 DIAGNOSIS — I2510 Atherosclerotic heart disease of native coronary artery without angina pectoris: Secondary | ICD-10-CM | POA: Diagnosis not present

## 2015-12-06 DIAGNOSIS — Z950 Presence of cardiac pacemaker: Secondary | ICD-10-CM | POA: Diagnosis not present

## 2015-12-06 LAB — CBC
HCT: 40.7 % (ref 39.0–52.0)
Hemoglobin: 13.3 g/dL (ref 13.0–17.0)
MCH: 32.8 pg (ref 26.0–34.0)
MCHC: 32.7 g/dL (ref 30.0–36.0)
MCV: 100.5 fL — ABNORMAL HIGH (ref 78.0–100.0)
Platelets: 112 10*3/uL — ABNORMAL LOW (ref 150–400)
RBC: 4.05 MIL/uL — ABNORMAL LOW (ref 4.22–5.81)
RDW: 15.1 % (ref 11.5–15.5)
WBC: 6.4 10*3/uL (ref 4.0–10.5)

## 2015-12-06 LAB — BASIC METABOLIC PANEL
Anion gap: 6 (ref 5–15)
BUN: 21 mg/dL — ABNORMAL HIGH (ref 6–20)
CO2: 32 mmol/L (ref 22–32)
Calcium: 9.1 mg/dL (ref 8.9–10.3)
Chloride: 103 mmol/L (ref 101–111)
Creatinine, Ser: 1.52 mg/dL — ABNORMAL HIGH (ref 0.61–1.24)
GFR calc Af Amer: 50 mL/min — ABNORMAL LOW (ref >60)
GFR calc non Af Amer: 43 mL/min — ABNORMAL LOW (ref >60)
Glucose, Bld: 95 mg/dL (ref 65–99)
Potassium: 4.7 mmol/L (ref 3.5–5.1)
Sodium: 141 mmol/L (ref 135–145)

## 2015-12-06 MED ORDER — PREDNISONE 20 MG PO TABS
60.0000 mg | ORAL_TABLET | Freq: Once | ORAL | Status: AC
  Administered 2015-12-06: 60 mg via ORAL
  Filled 2015-12-06: qty 3

## 2015-12-06 MED ORDER — DOXYCYCLINE HYCLATE 100 MG IV SOLR
100.0000 mg | Freq: Once | INTRAVENOUS | Status: AC
  Administered 2015-12-06: 100 mg via INTRAVENOUS
  Filled 2015-12-06: qty 100

## 2015-12-06 MED ORDER — ALBUTEROL SULFATE (2.5 MG/3ML) 0.083% IN NEBU
5.0000 mg | INHALATION_SOLUTION | Freq: Once | RESPIRATORY_TRACT | Status: AC
  Administered 2015-12-06: 5 mg via RESPIRATORY_TRACT
  Filled 2015-12-06: qty 6

## 2015-12-06 MED ORDER — PREDNISONE 20 MG PO TABS: 40.0000 mg | ORAL_TABLET | Freq: Every day | ORAL | 0 refills | Status: DC

## 2015-12-06 MED ORDER — AZITHROMYCIN 250 MG PO TABS: 250.0000 mg | ORAL_TABLET | Freq: Every day | ORAL | 0 refills | Status: DC

## 2015-12-06 NOTE — ED Provider Notes (Signed)
[facility] DEPT Provider Note   CSN: [id number] Arrival date & time: 12/06/15  1134  By signing my name below, I, Sonum Patel, attest that this documentation has been prepared under the direction and in the presence of Virgel Manifold, MD. Electronically Signed: Sonum Patel, Education administrator. 12/06/15. 12:28 PM. History   Chief Complaint Chief Complaint  Patient presents with  . Shortness of Breath  . Cough    The history is provided by the patient. No language interpreter was used.     HPI Comments: YECHESKEL KUREK is a 75 y.o. male who presents to the Emergency Department complaining of gradually worsened SOB with associated productive cough for the past few days. He states the SOB becomes slightly worse with lying in a supine position. He states he has home oxygen but uses it mostly at night; he is unsure of the flow rate. He denies any body pain, fever, CP. He is a former smoker.   Past Medical History:  Diagnosis Date  . Atrial fibrillation ([place])   . CAD (coronary artery disease) 1996   status post PCI of the RCA   . Congestive heart failure, unspecified   . COPD (chronic obstructive pulmonary disease) (HCC)    Pt on home O2 at night and PRN, unsure of date of diagnosis.  . Diabetes mellitus, type 2 ([place])   . DJD (degenerative joint disease)   . GERD (gastroesophageal reflux disease)   . Gout   . HTN (hypertension)   . 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 Nephrolithiasis   . Other primary cardiomyopathies   . Personal history of DVT (deep vein thrombosis)   . Prostatitis     Patient Active Problem List   Diagnosis Date Noted  . Lower GI bleed 09/11/2015  . Bright red blood per rectum 09/11/2015  . Community acquired pneumonia 08/24/2015  . Acute exacerbation of chronic obstructive pulmonary disease (COPD) ([place]) 03/05/2015  . Acute renal failure superimposed on stage 3 chronic kidney disease ([place]) 03/05/2015  . COPD with acute exacerbation ([place]) 03/05/2015  .  Renal failure (ARF), acute on chronic (HCC)   . Stomach ache 01/18/2015  . COPD exacerbation ([place])   . AKI (acute kidney injury) ([place]) 12/26/2014  . BPH (benign prostatic hyperplasia) 12/25/2014  . FTT (failure to thrive) in adult 12/25/2014  . Encounter for therapeutic drug monitoring 08/05/2014  . Pulmonary nodules 12/23/2013  . Hemoptysis   . Dyspnea   . Acute on chronic systolic CHF (congestive heart failure) ([place])   . Chronic kidney disease, stage 3   . Coronary artery disease involving native coronary artery of native heart without angina pectoris   . Paroxysmal atrial fibrillation (HCC)   . Frequent PVCs   . Hypoxia   . Acute on chronic respiratory failure with hypoxia ([place])   . Chronic obstructive pulmonary disease with acute exacerbation ([place])   . COPD (chronic obstructive pulmonary disease) ([place]) 06/16/2013  . Lipoma of neck 09/11/2011  . 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 Coronary artery disease 01/30/2011  . Chronic atrial fibrillation ([place]) 01/30/2011  . Chronic systolic CHF (congestive heart failure) ([place])   . Other primary cardiomyopathies   . Diverticulosis of colon with hemorrhage 08/28/2010    Past Surgical History:  Procedure Laterality Date  . BACK SURGERY    . CARDIAC DEFIBRILLATOR PLACEMENT    . CORONARY STENT PLACEMENT    . PACEMAKER INSERTION         Home Medications    Prior  to Admission medications   Medication Sig Start Date End Date Taking? Authorizing Provider  acetaminophen (TYLENOL) 500 MG tablet Take 500-1,000 mg by mouth every 6 (six) hours as needed for mild pain.    Historical Provider, MD  albuterol (ACCUNEB) 1.25 MG/3ML nebulizer solution Take 3 mLs by nebulization 4 (four) times daily. 08/21/15   Historical Provider, MD  alfuzosin (UROXATRAL) 10 MG 24 hr tablet Take 10 mg by mouth daily with breakfast.     Historical Provider, MD  allopurinol (ZYLOPRIM) 100 MG tablet Take 100 mg by mouth daily.     Historical Provider, MD  atorvastatin (LIPITOR) 10 MG tablet Take 1 tablet (10 mg total) by mouth at bedtime. 08/03/14   Jettie Booze, MD  carvedilol (COREG) 12.5 MG tablet Take 1 tablet (12.5 mg total) by mouth 2 (two) times daily with a meal. 03/03/15   Jettie Booze, MD  cetirizine (ZYRTEC) 10 MG tablet Take 10 mg by mouth daily.    Historical Provider, MD  digoxin (LANOXIN) 0.125 MG tablet Take 0.5 tablets (0.0625 mg total) by mouth daily. 06/23/15   Jettie Booze, MD  fluticasone (FLONASE) 50 MCG/ACT nasal spray Place 2 sprays into both nostrils daily as needed for allergies.  04/07/13   Historical Provider, MD  furosemide (LASIX) 40 MG tablet Take 1 tablet (40 mg total) by mouth daily. 10/09/15   Jola Schmidt, MD  losartan (COZAAR) 25 MG tablet Take 1 tablet (25 mg total) by mouth daily. 10/09/15   Jola Schmidt, MD  mometasone-formoterol [facility]) 100-5 MCG/ACT AERO Inhale 2 puffs into the lungs 2 (two) times daily. 09/13/15   Geradine Girt, DO  potassium chloride (K-DUR) 10 MEQ tablet Take 1 tablet (10 mEq total) by mouth 2 (two) times daily. 09/13/15   Jessica U Vann, DO  PROAIR HFA 108 (90 BASE) MCG/ACT inhaler INHALE 2 PUFFS EVERY 4 HOURS AS NEEDED FOR WHEEZING OR SHORTNESS OF BREATH. 09/05/14   Collene Gobble, MD  warfarin (COUMADIN) 1 MG tablet Take 2 tabs daily except 3 tabs on Wednesday & Saturday or as directed by [facility] 09/13/15   Geradine Girt, DO    Family History Family History  Problem Relation Age of Onset  . Heart disease Father   . Cancer Father     LUNG  . Colon cancer Mother   . Stroke Paternal Uncle   . Heart attack Neg Hx   . Hyperlipidemia Neg Hx   . Hypertension Neg Hx     Social History Social History  Substance Use Topics  . Smoking status: Former Smoker    Packs/day: 4.00    Years: 50.00    Types: Cigarettes    Quit date: 01/22/1980  . Smokeless tobacco: Never Used  . Alcohol use No     Comment: denies     Allergies     Benadryl [diphenhydramine hcl]   Review of Systems Review of Systems  A complete 10 system review of systems was obtained and all systems are negative except as noted in the HPI and PMH.    Physical Exam Updated Vital Signs BP 134/70 (BP Location: Right Arm)   Pulse 82   Temp 97.9 F (36.6 C) (Oral)   Resp 20   SpO2 100%   Physical Exam  Constitutional: He is oriented to person, place, and time. He appears well-developed and well-nourished.  HENT:  Head: Normocephalic and atraumatic.  Eyes: EOM are normal.  Neck: Normal range of motion.  Cardiovascular:  Normal rate, regular rhythm, normal heart sounds and intact distal pulses.   Pulmonary/Chest: Effort normal. No respiratory distress. He has wheezes.  Diminished breath sounds bilaterally with faint expiratory wheezing.   Abdominal: Soft. He exhibits no distension. There is no tenderness.  Musculoskeletal: Normal range of motion. He exhibits edema ( Mild symmetric lower extremity edema. ).  Neurological: He is alert and oriented to person, place, and time.  Skin: Skin is warm and dry.  Psychiatric: He has a normal mood and affect. Judgment normal.  Nursing note and vitals reviewed.    ED Treatments / Results  DIAGNOSTIC STUDIES: Oxygen Saturation is 100% on RA, normal by my interpretation.    COORDINATION OF CARE: 12:32 PM Discussed treatment plan with pt at bedside and pt agreed to plan.    Labs (all labs ordered are listed, but only abnormal results are displayed) Labs Reviewed  BASIC METABOLIC PANEL  CBC    EKG  EKG Interpretation None       Radiology Dg Chest 2 View  Result Date: 12/05/2015 CLINICAL DATA:  75 year old male with cough for 5 days. Initial encounter. Former smoker. EXAM: CHEST  2 VIEW COMPARISON:  08/24/2015 and earlier. FINDINGS: Stable lung volumes. Stable cardiac size and mediastinal contours. Stable left chest cardiac AICD. Visualized tracheal air column is within normal limits. No  pneumothorax, pulmonary edema, pleural effusion or confluent pulmonary opacity. Calcified aortic atherosclerosis. No acute osseous abnormality identified. IMPRESSION: No acute cardiopulmonary abnormality. Calcified aortic atherosclerosis. Electronically Signed   By: Genevie Ann M.D.   On: 12/05/2015 12:24    Procedures Procedures (including critical care time)  Medications Ordered in ED Medications  albuterol (PROVENTIL) (2.5 MG/3ML) 0.083% nebulizer solution 5 mg (5 mg Nebulization Given 12/06/15 1201)  predniSONE (DELTASONE) tablet 60 mg (60 mg Oral Given 12/06/15 1256)  doxycycline (VIBRAMYCIN) 100 mg in dextrose 5 % 250 mL IVPB (0 mg Intravenous Stopped 12/06/15 1531)     Initial Impression / Assessment and Plan / ED Course  I have reviewed the triage vital signs and the nursing notes.  Pertinent labs & imaging results that were available during my care of the patient were reviewed by me and considered in my medical decision making (see chart for details).  Clinical Course     75 year old male with what is clinically COPD exacerbation. I feel is appropriate for discharge at this time with continued symptomatic treatment. Return precautions were discussed. Outpatient follow-up otherwise.  Final Clinical Impressions(s) / ED Diagnoses   Final diagnoses:  COPD exacerbation ([place])    New Prescriptions New Prescriptions   No medications on file   I personally preformed the services scribed in my presence. The recorded information has been reviewed is accurate. Virgel Manifold, MD.    Virgel Manifold, MD 12/18/15 [phone]

## 2015-12-06 NOTE — ED Triage Notes (Signed)
Pt reports SOB and Productive Cough that started this past Saturday. Pt demonstrating pursed lip breathing. Pt reports hx of COPD. Pt reports using inhaler today w/o relief. Pt reports chest tightness at this time. Pt A+OX4, ambulatory w/ cane to triage. SPO2 on RA 89%. Pt placed on 2L to 100%

## 2015-12-06 NOTE — ED Notes (Signed)
Bed: RN16 Expected date:  Expected time:  Means of arrival:  Comments: Hold TR2

## 2015-12-06 NOTE — ED Notes (Signed)
Patient transported to X-ray 

## 2015-12-07 ENCOUNTER — Telehealth: Payer: Self-pay | Admitting: Interventional Cardiology

## 2015-12-07 NOTE — Telephone Encounter (Signed)
New Message  Pt voiced wanting nurse to call him about his ED visit previously.  Please f/u

## 2015-12-07 NOTE — Telephone Encounter (Signed)
ED visits reviewed. Pt was seen at [facility] and [facility] this week. Pt is calling to let Dr. Beau Fanny know.  He reports he went to ED for shortness of breath and coughing.  Pt reports no weight gain or edema.  Treated with antibiotic.  Pt thinks he may need to see a lung specialist.  I encourage pt to follow up with primary care.  He is thinking about changing primary care doctors.  I gave him name of [facility] on [address] [place].

## 2015-12-12 ENCOUNTER — Ambulatory Visit (INDEPENDENT_AMBULATORY_CARE_PROVIDER_SITE_OTHER): Payer: Medicare HMO | Admitting: *Deleted

## 2015-12-12 DIAGNOSIS — I482 Chronic atrial fibrillation, unspecified: Secondary | ICD-10-CM

## 2015-12-12 DIAGNOSIS — Z5181 Encounter for therapeutic drug level monitoring: Secondary | ICD-10-CM | POA: Diagnosis not present

## 2015-12-12 DIAGNOSIS — I480 Paroxysmal atrial fibrillation: Secondary | ICD-10-CM | POA: Diagnosis not present

## 2015-12-12 DIAGNOSIS — I82409 Acute embolism and thrombosis of unspecified deep veins of unspecified lower extremity: Secondary | ICD-10-CM

## 2015-12-12 LAB — POCT INR: INR: 2.9

## 2015-12-15 ENCOUNTER — Emergency Department (HOSPITAL_COMMUNITY): Payer: Medicare HMO

## 2015-12-15 ENCOUNTER — Emergency Department (HOSPITAL_COMMUNITY)
Admission: EM | Admit: 2015-12-15 | Discharge: 2015-12-15 | Disposition: A | Discharge status: Home / Self Care | Payer: Medicare HMO | Attending: Emergency Medicine | Admitting: Emergency Medicine

## 2015-12-15 DIAGNOSIS — E1122 Type 2 diabetes mellitus with diabetic chronic kidney disease: Secondary | ICD-10-CM | POA: Diagnosis not present

## 2015-12-15 DIAGNOSIS — I130 Hypertensive heart and chronic kidney disease with heart failure and stage 1 through stage 4 chronic kidney disease, or unspecified chronic kidney disease: Secondary | ICD-10-CM | POA: Insufficient documentation

## 2015-12-15 DIAGNOSIS — J441 Chronic obstructive pulmonary disease with (acute) exacerbation: Secondary | ICD-10-CM | POA: Insufficient documentation

## 2015-12-15 DIAGNOSIS — Z950 Presence of cardiac pacemaker: Secondary | ICD-10-CM | POA: Diagnosis not present

## 2015-12-15 DIAGNOSIS — I2510 Atherosclerotic heart disease of native coronary artery without angina pectoris: Secondary | ICD-10-CM | POA: Insufficient documentation

## 2015-12-15 DIAGNOSIS — Z7901 Long term (current) use of anticoagulants: Secondary | ICD-10-CM | POA: Diagnosis not present

## 2015-12-15 DIAGNOSIS — R1032 Left lower quadrant pain: Secondary | ICD-10-CM | POA: Diagnosis not present

## 2015-12-15 DIAGNOSIS — N183 Chronic kidney disease, stage 3 (moderate): Secondary | ICD-10-CM | POA: Diagnosis not present

## 2015-12-15 DIAGNOSIS — Z79899 Other long term (current) drug therapy: Secondary | ICD-10-CM | POA: Diagnosis not present

## 2015-12-15 DIAGNOSIS — Z87891 Personal history of nicotine dependence: Secondary | ICD-10-CM | POA: Diagnosis not present

## 2015-12-15 DIAGNOSIS — J40 Bronchitis, not specified as acute or chronic: Secondary | ICD-10-CM | POA: Diagnosis present

## 2015-12-15 DIAGNOSIS — I5023 Acute on chronic systolic (congestive) heart failure: Secondary | ICD-10-CM | POA: Diagnosis not present

## 2015-12-15 LAB — URINALYSIS, ROUTINE W REFLEX MICROSCOPIC
BILIRUBIN URINE: NEGATIVE
GLUCOSE, UA: NEGATIVE mg/dL
HGB URINE DIPSTICK: NEGATIVE
KETONES UR: NEGATIVE mg/dL
Leukocytes, UA: NEGATIVE
NITRITE: NEGATIVE
PH: 5 (ref 5.0–8.0)
Protein, ur: NEGATIVE mg/dL
Specific Gravity, Urine: 1.009 (ref 1.005–1.030)

## 2015-12-15 LAB — CBC WITH DIFFERENTIAL/PLATELET
BASOS PCT: 0 %
Basophils Absolute: 0 10*3/uL (ref 0.0–0.1)
Eosinophils Absolute: 0.1 10*3/uL (ref 0.0–0.7)
Eosinophils Relative: 1 %
HEMATOCRIT: 42.7 % (ref 39.0–52.0)
Hemoglobin: 14.5 g/dL (ref 13.0–17.0)
Lymphocytes Relative: 10 %
Lymphs Abs: 1.3 10*3/uL (ref 0.7–4.0)
MCH: 33.2 pg (ref 26.0–34.0)
MCHC: 34 g/dL (ref 30.0–36.0)
MCV: 97.7 fL (ref 78.0–100.0)
MONO ABS: 0.7 10*3/uL (ref 0.1–1.0)
MONOS PCT: 5 %
NEUTROS ABS: 11.2 10*3/uL — AB (ref 1.7–7.7)
Neutrophils Relative %: 84 %
Platelets: 92 10*3/uL — ABNORMAL LOW (ref 150–400)
RBC: 4.37 MIL/uL (ref 4.22–5.81)
RDW: 14.9 % (ref 11.5–15.5)
WBC: 13.3 10*3/uL — ABNORMAL HIGH (ref 4.0–10.5)

## 2015-12-15 LAB — BASIC METABOLIC PANEL
Anion gap: 8 (ref 5–15)
BUN: 29 mg/dL — AB (ref 6–20)
CO2: 26 mmol/L (ref 22–32)
Calcium: 8.9 mg/dL (ref 8.9–10.3)
Chloride: 104 mmol/L (ref 101–111)
Creatinine, Ser: 1.35 mg/dL — ABNORMAL HIGH (ref 0.61–1.24)
GFR calc Af Amer: 58 mL/min — ABNORMAL LOW (ref >60)
GFR, EST NON AFRICAN AMERICAN: 50 mL/min — AB (ref >60)
GLUCOSE: 99 mg/dL (ref 65–99)
POTASSIUM: 3.9 mmol/L (ref 3.5–5.1)
Sodium: 138 mmol/L (ref 135–145)

## 2015-12-15 LAB — BRAIN NATRIURETIC PEPTIDE: B Natriuretic Peptide: 244 pg/mL — ABNORMAL HIGH (ref 0.0–100.0)

## 2015-12-15 MED ORDER — IOPAMIDOL (ISOVUE-300) INJECTION 61%
100.0000 mL | Freq: Once | INTRAVENOUS | Status: AC | PRN
  Administered 2015-12-15: 80 mL via INTRAVENOUS

## 2015-12-15 MED ORDER — IOPAMIDOL (ISOVUE-300) INJECTION 61%
INTRAVENOUS | Status: AC
  Filled 2015-12-15: qty 150

## 2015-12-15 MED ORDER — PREDNISONE 20 MG PO TABS
40.0000 mg | ORAL_TABLET | Freq: Once | ORAL | Status: AC
  Administered 2015-12-15: 40 mg via ORAL
  Filled 2015-12-15: qty 2

## 2015-12-15 MED ORDER — SODIUM CHLORIDE 0.9 % IJ SOLN
INTRAMUSCULAR | Status: AC
  Filled 2015-12-15: qty 50

## 2015-12-15 MED ORDER — ALBUTEROL SULFATE (2.5 MG/3ML) 0.083% IN NEBU
5.0000 mg | INHALATION_SOLUTION | Freq: Once | RESPIRATORY_TRACT | Status: AC
  Administered 2015-12-15: 5 mg via RESPIRATORY_TRACT
  Filled 2015-12-15: qty 6

## 2015-12-15 MED ORDER — IOPAMIDOL (ISOVUE-300) INJECTION 61%
INTRAVENOUS | Status: AC
  Filled 2015-12-15: qty 100

## 2015-12-15 MED ORDER — ALBUTEROL SULFATE (2.5 MG/3ML) 0.083% IN NEBU: 2.5000 mg | INHALATION_SOLUTION | Freq: Once | RESPIRATORY_TRACT | Status: DC

## 2015-12-15 MED ORDER — PREDNISONE 10 MG PO TABS: 40.0000 mg | ORAL_TABLET | Freq: Every day | ORAL | 0 refills | Status: AC

## 2015-12-15 MED ORDER — ALBUTEROL (5 MG/ML) CONTINUOUS INHALATION SOLN
10.0000 mg/h | INHALATION_SOLUTION | RESPIRATORY_TRACT | Status: DC
  Administered 2015-12-15: 10 mg/h via RESPIRATORY_TRACT
  Filled 2015-12-15: qty 20

## 2015-12-15 MED ORDER — IPRATROPIUM-ALBUTEROL 0.5-2.5 (3) MG/3ML IN SOLN
3.0000 mL | RESPIRATORY_TRACT | Status: AC
  Administered 2015-12-15 (×3): 3 mL via RESPIRATORY_TRACT
  Filled 2015-12-15: qty 9

## 2015-12-15 NOTE — ED Provider Notes (Signed)
[facility] DEPT Provider Note   CSN: [id number] Arrival date & time: 12/15/15  1257     History   Chief Complaint Chief Complaint  Patient presents with  . Bronchitis  . Abdominal Pain  . Dehydration    HPI Troy [name] is a 75 y.o. male.  The history is provided by the patient.  Cough  This is a recurrent problem. The current episode started more than 1 week ago. Episode frequency: frequent. The problem has not changed since onset.The cough is productive of sputum. There has been no fever. Associated symptoms include shortness of breath and wheezing. Pertinent negatives include no chest pain, no chills, no headaches and no rhinorrhea. Treatments tried: inhalers. The treatment provided mild relief. His past medical history is significant for bronchitis and COPD. Past medical history comments: h/o prostatitis.  Abdominal Pain   This is a new problem. Episode onset: 2 weeks. Episode frequency: intermittent; lasting for several minutes then self resolves. The problem has been gradually worsening. The pain is located in the suprapubic region. The quality of the pain is burning. The pain is mild. Associated symptoms include constipation. Pertinent negatives include fever, diarrhea, melena, nausea, dysuria, frequency and headaches. Nothing aggravates the symptoms. Nothing relieves the symptoms. Past medical history comments: h/o prostatitis.    Past Medical History:  Diagnosis Date  . Atrial fibrillation ([place])   . CAD (coronary artery disease) 1996   status post PCI of the RCA   . Congestive heart failure, unspecified   . COPD (chronic obstructive pulmonary disease) (HCC)    Pt on home O2 at night and PRN, unsure of date of diagnosis.  . Diabetes mellitus, type 2 ([place])   . DJD (degenerative joint disease)   . GERD (gastroesophageal reflux disease)   . Gout   . HTN (hypertension)   . 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 Nephrolithiasis   . Other primary  cardiomyopathies   . Personal history of DVT (deep vein thrombosis)   . Prostatitis     Patient Active Problem List   Diagnosis Date Noted  . Lower GI bleed 09/11/2015  . Bright red blood per rectum 09/11/2015  . Community acquired pneumonia 08/24/2015  . Acute exacerbation of chronic obstructive pulmonary disease (COPD) ([place]) 03/05/2015  . Acute renal failure superimposed on stage 3 chronic kidney disease ([place]) 03/05/2015  . COPD with acute exacerbation ([place]) 03/05/2015  . Renal failure (ARF), acute on chronic (HCC)   . Stomach ache 01/18/2015  . COPD exacerbation ([place])   . AKI (acute kidney injury) ([place]) 12/26/2014  . BPH (benign prostatic hyperplasia) 12/25/2014  . FTT (failure to thrive) in adult 12/25/2014  . Encounter for therapeutic drug monitoring 08/05/2014  . Pulmonary nodules 12/23/2013  . Hemoptysis   . Dyspnea   . Acute on chronic systolic CHF (congestive heart failure) ([place])   . Chronic kidney disease, stage 3   . Coronary artery disease involving native coronary artery of native heart without angina pectoris   . Paroxysmal atrial fibrillation (HCC)   . Frequent PVCs   . Hypoxia   . Acute on chronic respiratory failure with hypoxia ([place])   . Chronic obstructive pulmonary disease with acute exacerbation ([place])   . COPD (chronic obstructive pulmonary disease) ([place]) 06/16/2013  . Lipoma of neck 09/11/2011  . 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 Coronary artery disease 01/30/2011  . Chronic atrial fibrillation ([place]) 01/30/2011  . Chronic systolic CHF (congestive heart failure) ([place])   . Other  primary cardiomyopathies   . Diverticulosis of colon with hemorrhage 08/28/2010    Past Surgical History:  Procedure Laterality Date  . BACK SURGERY    . CARDIAC DEFIBRILLATOR PLACEMENT    . CORONARY STENT PLACEMENT    . PACEMAKER INSERTION         Home Medications    Prior to Admission medications   Medication Sig Start Date  End Date Taking? Authorizing Provider  acetaminophen (TYLENOL) 500 MG tablet Take 500-1,000 mg by mouth every 6 (six) hours as needed for mild pain.    Historical Provider, MD  albuterol (ACCUNEB) 1.25 MG/3ML nebulizer solution Take 3 mLs by nebulization 4 (four) times daily. 08/21/15   Historical Provider, MD  alfuzosin (UROXATRAL) 10 MG 24 hr tablet Take 10 mg by mouth daily with breakfast.     Historical Provider, MD  allopurinol (ZYLOPRIM) 100 MG tablet Take 100 mg by mouth daily.    Historical Provider, MD  atorvastatin (LIPITOR) 10 MG tablet Take 1 tablet (10 mg total) by mouth at bedtime. 08/03/14   Jettie Booze, MD  azithromycin (ZITHROMAX Z-PAK) 250 MG tablet Take 1 tablet (250 mg total) by mouth daily. 2 pills (500mg ) day 1 then 1 pill (250mg ) days 2-5. 12/06/15   Virgel Manifold, MD  carvedilol (COREG) 12.5 MG tablet Take 1 tablet (12.5 mg total) by mouth 2 (two) times daily with a meal. 03/03/15   Jettie Booze, MD  cetirizine (ZYRTEC) 10 MG tablet Take 10 mg by mouth daily.    Historical Provider, MD  digoxin (LANOXIN) 0.125 MG tablet Take 0.5 tablets (0.0625 mg total) by mouth daily. 06/23/15   Jettie Booze, MD  fluticasone (FLONASE) 50 MCG/ACT nasal spray Place 2 sprays into both nostrils daily as needed for allergies.  04/07/13   Historical Provider, MD  furosemide (LASIX) 40 MG tablet Take 1 tablet (40 mg total) by mouth daily. 10/09/15   Jola Schmidt, MD  losartan (COZAAR) 25 MG tablet Take 1 tablet (25 mg total) by mouth daily. 10/09/15   Jola Schmidt, MD  mometasone-formoterol [facility] 2) 100-5 MCG/ACT AERO Inhale 2 puffs into the lungs 2 (two) times daily. 09/13/15   Geradine Girt, DO  potassium chloride (K-DUR) 10 MEQ tablet Take 1 tablet (10 mEq total) by mouth 2 (two) times daily. 09/13/15   Geradine Girt, DO  predniSONE (DELTASONE) 10 MG tablet Take 4 tablets (40 mg total) by mouth daily. 12/16/15 12/20/15  Fatima Blank, MD  predniSONE (DELTASONE) 20 MG tablet  Take 2 tablets (40 mg total) by mouth daily. 12/06/15   Virgel Manifold, MD  predniSONE (DELTASONE) 20 MG tablet Take 2 tablets (40 mg total) by mouth daily. 12/06/15   Virgel Manifold, MD  PROAIR HFA 108 (90 BASE) MCG/ACT inhaler INHALE 2 PUFFS EVERY 4 HOURS AS NEEDED FOR WHEEZING OR SHORTNESS OF BREATH. 09/05/14   Collene Gobble, MD  warfarin (COUMADIN) 1 MG tablet Take 2 tabs daily except 3 tabs on Wednesday & Saturday or as directed by [facility] Clinic Patient taking differently: Take 2mg  by mouth on Tuesday, Thursday, Friday, Sunday. Take 3mg  by mouth on Monday, Wednesday & Saturday as directed by Coumadin Clinic 09/13/15   Geradine Girt, DO    Family History Family History  Problem Relation Age of Onset  . Heart disease Father   . Cancer Father     LUNG  . Colon cancer Mother   . Stroke Paternal Uncle   . Heart attack Neg Hx   .  Hyperlipidemia Neg Hx   . Hypertension Neg Hx     Social History Social History  Substance Use Topics  . Smoking status: Former Smoker    Packs/day: 4.00    Years: 50.00    Types: Cigarettes    Quit date: 01/22/1980  . Smokeless tobacco: Never Used  . Alcohol use No     Comment: denies     Allergies   Benadryl [diphenhydramine hcl]   Review of Systems Review of Systems  Constitutional: Negative for chills and fever.  HENT: Negative for rhinorrhea.   Respiratory: Positive for cough, shortness of breath and wheezing.   Cardiovascular: Negative for chest pain.  Gastrointestinal: Positive for abdominal pain and constipation. Negative for diarrhea, melena and nausea.  Genitourinary: Negative for dysuria and frequency.  Neurological: Negative for headaches.  Ten systems are reviewed and are negative for acute change except as noted in the HPI    Physical Exam Updated Vital Signs BP 167/78   Pulse 75   Temp 97.5 F (36.4 C) (Oral)   Resp 18   Ht 5\' 7"  (1.702 m)   Wt 170 lb (77.1 kg)   SpO2 96%   BMI 26.63 kg/m   Physical Exam    Constitutional: He is oriented to person, place, and time. He appears well-developed and well-nourished. No distress.  HENT:  Head: Normocephalic and atraumatic.  Nose: Nose normal.  Eyes: Conjunctivae and EOM are normal. Pupils are equal, round, and reactive to light. Right eye exhibits no discharge. Left eye exhibits no discharge. No scleral icterus.  Neck: Normal range of motion. Neck supple.  Cardiovascular: Normal rate and regular rhythm.  Exam reveals no gallop and no friction rub.   No murmur heard. Pulmonary/Chest: Effort normal and breath sounds normal. No stridor. No respiratory distress. He has no rales.  Abdominal: Soft. He exhibits no distension. There is tenderness in the left lower quadrant. There is no rigidity, no rebound and no guarding.  Musculoskeletal: He exhibits no edema or tenderness.  Neurological: He is alert and oriented to person, place, and time.  Skin: Skin is warm and dry. No rash noted. He is not diaphoretic. No erythema.  Psychiatric: He has a normal mood and affect.  Vitals reviewed.    ED Treatments / Results  Labs (all labs ordered are listed, but only abnormal results are displayed) Labs Reviewed  BASIC METABOLIC PANEL - Abnormal; Notable for the following:       Result Value   BUN 29 (*)    Creatinine, Ser 1.35 (*)    GFR calc non Af Amer 50 (*)    GFR calc Af Amer 58 (*)    All other components within normal limits  BRAIN NATRIURETIC PEPTIDE - Abnormal; Notable for the following:    B Natriuretic Peptide 244.0 (*)    All other components within normal limits  CBC WITH DIFFERENTIAL/PLATELET - Abnormal; Notable for the following:    WBC 13.3 (*)    Platelets 92 (*)    Neutro Abs 11.2 (*)    All other components within normal limits  URINALYSIS, ROUTINE W REFLEX MICROSCOPIC (NOT AT [facility])  CBC WITH DIFFERENTIAL/PLATELET    EKG  EKG Interpretation  Date/Time:  Friday December 15 2015 13:46:42 EST Ventricular Rate:  78 PR Interval:     QRS Duration: 120 QT Interval:  410 QTC Calculation: 467 R Axis:   -110 Text Interpretation:  A-V dual-paced rhythm with some inhibition No further analysis attempted due to paced rhythm  No significant change since last tracing Confirmed by Winfred Leeds  MD, SAM [id number]) on 12/15/2015 2:30:11 PM       Radiology Dg Chest 2 View  Result Date: 12/15/2015 CLINICAL DATA:  SOB and cough with phlegm x 1 week COPD, bronchitis, a-fib, CAD, GERD, DM, HTN, CHF, ischemic dilated cardiomyopathy, former smoker x 25 years ago Sx: pacemaker, coronary stent placement, cardiac defib placement EXAM: CHEST  2 VIEW COMPARISON:  Chest x-rays dated 12/06/2015, 03/04/2015 in 12/2010 2016. FINDINGS: Heart size is upper normal, stable. Overall cardiomediastinal silhouette appears stable. Atherosclerotic changes again noted at the aortic arch. Left chest wall pacemaker/ICD in place without evidence of wire discontinuity or malposition. Lungs are clear. No pleural effusion or pneumothorax seen. Lungs are hyperexpanded. Degenerative spurring and ankylosis again noted throughout the kyphotic thoracic spine. No acute or suspicious osseous finding. IMPRESSION: 1. No active cardiopulmonary disease. No evidence of pneumonia or pulmonary edema. 2. COPD/emphysema. 3. Aortic atherosclerosis. Electronically Signed   By: Franki Cabot M.D.   On: 12/15/2015 14:23   Ct Abdomen Pelvis W Contrast  Result Date: 12/15/2015 CLINICAL DATA:  Persistent LLQ pain with constipation. EXAM: CT ABDOMEN AND PELVIS WITH CONTRAST TECHNIQUE: Multidetector CT imaging of the abdomen and pelvis was performed using the standard protocol following bolus administration of intravenous contrast. CONTRAST:  67mL ISOVUE-300 IOPAMIDOL (ISOVUE-300) INJECTION 61% COMPARISON:  CT abdomen dated 08/01/2015 FINDINGS: Lower chest: No acute abnormality. Hepatobiliary: Subtle hypodense lesion within the upper aspect of the right liver lobe, stable or slightly smaller compared  to the earlier CT of 06/21/2005, indicating benignity, presumed cyst. Liver otherwise normal. Gallbladder appears normal. No bile duct dilatation. Pancreas: Unremarkable. No pancreatic ductal dilatation or surrounding inflammatory changes. Spleen: Normal in size without focal abnormality. Adrenals/Urinary Tract: Adrenal glands appear normal. Kidneys are unremarkable without mass, stone or hydronephrosis. No ureteral or bladder calculi identified. Prostate gland is enlarged causing at least mild mass effect on the bladder base. Stomach/Bowel: Bowel is normal in caliber. Extensive diverticulosis again noted within the sigmoid colon without evidence of acute diverticulitis. Appendix is not convincingly seen but there are no inflammatory changes about the cecum to suggest acute appendicitis. Stomach appears normal. Vascular/Lymphatic: Atherosclerotic changes of the normal caliber abdominal aorta and aortic branch vessels. No enlarged lymph nodes appreciated in the abdomen or pelvis. Reproductive: Prostate enlargement, as previously described. Other: No free fluid or abscess collection. No free intraperitoneal air. Musculoskeletal: Degenerative changes of the thoracolumbar spine, moderate in degree. No acute or suspicious osseous finding. Superficial soft tissues are unremarkable. IMPRESSION: 1. No acute findings. No bowel obstruction or evidence of bowel wall inflammation. No CT evidence of constipation. No evidence of acute solid organ abnormality. No renal or ureteral calculi. 2. Extensive sigmoid colon diverticulosis without evidence of acute diverticulitis. 3. Aortic atherosclerosis. 4. Prostate enlargement, causing at least mild mass effect on the bladder base. No associated bladder distension. Electronically Signed   By: Franki Cabot M.D.   On: 12/15/2015 18:49    Procedures Procedures (including critical care time)  Medications Ordered in ED Medications  albuterol (PROVENTIL) (2.5 MG/3ML) 0.083%  nebulizer solution 5 mg (5 mg Nebulization Given 12/15/15 1351)  ipratropium-albuterol (DUONEB) 0.5-2.5 (3) MG/3ML nebulizer solution 3 mL (3 mLs Nebulization Given 12/15/15 1747)  predniSONE (DELTASONE) tablet 40 mg (40 mg Oral Given 12/15/15 1736)  iopamidol (ISOVUE-300) 61 % injection 100 mL (80 mLs Intravenous Contrast Given 12/15/15 1822)     Initial Impression / Assessment and Plan / ED Course  I  have reviewed the triage vital signs and the nursing notes.  Pertinent labs & imaging results that were available during my care of the patient were reviewed by me and considered in my medical decision making (see chart for details).  Clinical Course     1. Cough No evidence of pneumonia on chest x-ray. Likely COPD exacerbation related to bronchitis. Patient given breathing treatments and steroids. Significant improvement in his symptomatology following the treatment.  2. Abdominal pain Intermittent in nature. Patient however patient does have significant left lower quadrant abdominal pain. Note to have leukocytosis. CT abdomen pelvis revealed no acute intra-abdominal inflammatory or infectious process.  Safe for discharge with strict return precautions.  Final Clinical Impressions(s) / ED Diagnoses   Final diagnoses:  COPD exacerbation ([place])  Left lower quadrant pain   Disposition: Discharge  Condition: Good  I have discussed the results, Dx and Tx plan with the patient who expressed understanding and agree(s) with the plan. Discharge instructions discussed at great length. The patient was given strict return precautions who verbalized understanding of the instructions. No further questions at time of discharge.    Discharge Medication List as of 12/15/2015 10:26 PM    START taking these medications   Details  !! predniSONE (DELTASONE) 10 MG tablet Take 4 tablets (40 mg total) by mouth daily., Starting Sat 12/16/2015, Until Wed 12/20/2015, Print     !! - Potential duplicate  medications found. Please discuss with provider.      Follow Up: Elwyn Reach, MD [place] [id number] [phone]  Schedule an appointment as soon as possible for a visit  in 5-7 days, If symptoms do not improve or  worsen      Fatima Blank, MD 12/16/15 [phone]

## 2015-12-15 NOTE — ED Notes (Signed)
Pt given urinal and pt and visitor instructed that we need a sample.

## 2015-12-15 NOTE — ED Triage Notes (Signed)
Pt states he was seen her last week and dx with bronchitis and took antibiotics for 5 days. Pt still c/o cough/congestion and lower abdominal pain. Pt states last bowel movement was today (hard balls). Pt alert and oriented x4. Hx of COPD. Pt able to speak in full sentences.

## 2015-12-19 ENCOUNTER — Ambulatory Visit (INDEPENDENT_AMBULATORY_CARE_PROVIDER_SITE_OTHER): Payer: Medicare HMO

## 2015-12-19 ENCOUNTER — Telehealth: Payer: Self-pay | Admitting: Emergency Medicine

## 2015-12-19 DIAGNOSIS — I82409 Acute embolism and thrombosis of unspecified deep veins of unspecified lower extremity: Secondary | ICD-10-CM | POA: Diagnosis not present

## 2015-12-19 DIAGNOSIS — I480 Paroxysmal atrial fibrillation: Secondary | ICD-10-CM

## 2015-12-19 DIAGNOSIS — Z5181 Encounter for therapeutic drug level monitoring: Secondary | ICD-10-CM

## 2015-12-19 DIAGNOSIS — I482 Chronic atrial fibrillation, unspecified: Secondary | ICD-10-CM

## 2015-12-19 LAB — PROTIME-INR
INR: 5.25
PROTHROMBIN TIME: 49.8 s — AB (ref 11.4–15.2)

## 2015-12-19 LAB — POCT INR: INR: 6.6

## 2015-12-19 NOTE — Telephone Encounter (Signed)
ATC pt. Voicemail box is full. WCB.

## 2015-12-19 NOTE — Telephone Encounter (Signed)
Attempted to contact pt. No answer and I could not leave a message due to his voicemail not being set up. Will try back.

## 2015-12-19 NOTE — Telephone Encounter (Signed)
Pt returning call.Troy [name] ° °

## 2015-12-19 NOTE — Telephone Encounter (Signed)
Spoke with pt. He wants to be changed to another MD other than RB. I tried to explain to the pt our office policy with switching physicians. He became very upset and started to yell at me. Stated, "Just do what I said and give me an appointment." I again tried to explain to the pt our office policy for switching physicians. Pt proceeded to tell me to shut up and do what he told me to do. I then advised that pt that I was going to end the conversation and have one of my team leads contact him about this matter. There were 2 of my coworkers in the same room with me during this conversation that could hear him yelling at me on the other end of the line. This message will be given to Elise/Jessica/Katie to handle.

## 2015-12-20 NOTE — Telephone Encounter (Signed)
ATC pt. Voicemail box is full. WCB.

## 2015-12-22 NOTE — Telephone Encounter (Signed)
ATC pt. VM full. Will sign off on message due to several unsuccessful attempts to reach pt, without return call. If pt returns call he will need to speak with a team lead.

## 2016-01-05 ENCOUNTER — Telehealth: Payer: Self-pay | Admitting: Interventional Cardiology

## 2016-01-05 NOTE — Telephone Encounter (Signed)
New message  Pt is returning call  Unable to determine who called pt  Please call back

## 2016-01-05 NOTE — Telephone Encounter (Signed)
Advised pt that I was unable to locate who called.  Told him it was possibly just a reminder call about his [facility] appt on 12/19.  Reminded pt of this appt.  Pt appreciative for call back.

## 2016-01-09 ENCOUNTER — Ambulatory Visit (INDEPENDENT_AMBULATORY_CARE_PROVIDER_SITE_OTHER): Payer: Medicare HMO

## 2016-01-09 DIAGNOSIS — I482 Chronic atrial fibrillation, unspecified: Secondary | ICD-10-CM

## 2016-01-09 DIAGNOSIS — Z5181 Encounter for therapeutic drug level monitoring: Secondary | ICD-10-CM

## 2016-01-09 DIAGNOSIS — I82409 Acute embolism and thrombosis of unspecified deep veins of unspecified lower extremity: Secondary | ICD-10-CM

## 2016-01-09 DIAGNOSIS — I480 Paroxysmal atrial fibrillation: Secondary | ICD-10-CM | POA: Diagnosis not present

## 2016-01-09 LAB — POCT INR: INR: 2.9

## 2016-01-17 ENCOUNTER — Telehealth: Payer: Self-pay | Admitting: Pharmacist

## 2016-01-17 NOTE — Telephone Encounter (Signed)
Patient called clinic today 12/27/7 to request information about holding doses for incoming Dentist visit.    Patient unaware of any surgical procedure, extractions, root canals, etc.  Informed he has an appointment with the dentist in January/9 and "may need some work done".  Stated that "they may not do nothing this time".  **Instrcuted to continue warfarin/coumadin as prescribed. DO NOT hold warfarin doses unless multiple extractions needed. Inform dentist of chronic  warfarin therapy. Follow dentist instructions about incoming procedure ** Also instructed to call coumadin clinic with date of any procedures or medication changes**

## 2016-02-09 ENCOUNTER — Ambulatory Visit (INDEPENDENT_AMBULATORY_CARE_PROVIDER_SITE_OTHER): Payer: Medicare Other | Admitting: Pharmacist

## 2016-02-09 DIAGNOSIS — I482 Chronic atrial fibrillation, unspecified: Secondary | ICD-10-CM

## 2016-02-09 DIAGNOSIS — I82409 Acute embolism and thrombosis of unspecified deep veins of unspecified lower extremity: Secondary | ICD-10-CM | POA: Diagnosis not present

## 2016-02-09 DIAGNOSIS — I480 Paroxysmal atrial fibrillation: Secondary | ICD-10-CM

## 2016-02-09 DIAGNOSIS — Z5181 Encounter for therapeutic drug level monitoring: Secondary | ICD-10-CM | POA: Diagnosis not present

## 2016-02-09 LAB — POCT INR: INR: 2.8

## 2016-03-08 ENCOUNTER — Inpatient Hospital Stay (HOSPITAL_COMMUNITY)
Admission: EM | Admit: 2016-03-08 | Discharge: 2016-03-10 | DRG: 291 | Disposition: A | Discharge status: Home Health | Payer: Medicare Other | Attending: Internal Medicine | Admitting: Internal Medicine

## 2016-03-08 ENCOUNTER — Ambulatory Visit (INDEPENDENT_AMBULATORY_CARE_PROVIDER_SITE_OTHER): Payer: Medicare Other | Admitting: *Deleted

## 2016-03-08 ENCOUNTER — Emergency Department (HOSPITAL_COMMUNITY): Payer: Medicare Other

## 2016-03-08 ENCOUNTER — Encounter (HOSPITAL_COMMUNITY): Payer: Self-pay | Admitting: Emergency Medicine

## 2016-03-08 DIAGNOSIS — I420 Dilated cardiomyopathy: Secondary | ICD-10-CM | POA: Diagnosis present

## 2016-03-08 DIAGNOSIS — N183 Chronic kidney disease, stage 3 unspecified: Secondary | ICD-10-CM | POA: Diagnosis present

## 2016-03-08 DIAGNOSIS — J441 Chronic obstructive pulmonary disease with (acute) exacerbation: Secondary | ICD-10-CM | POA: Diagnosis present

## 2016-03-08 DIAGNOSIS — J9622 Acute and chronic respiratory failure with hypercapnia: Secondary | ICD-10-CM | POA: Diagnosis present

## 2016-03-08 DIAGNOSIS — Z7951 Long term (current) use of inhaled steroids: Secondary | ICD-10-CM | POA: Diagnosis not present

## 2016-03-08 DIAGNOSIS — I5033 Acute on chronic diastolic (congestive) heart failure: Secondary | ICD-10-CM | POA: Diagnosis present

## 2016-03-08 DIAGNOSIS — Z7901 Long term (current) use of anticoagulants: Secondary | ICD-10-CM | POA: Diagnosis not present

## 2016-03-08 DIAGNOSIS — I482 Chronic atrial fibrillation, unspecified: Secondary | ICD-10-CM

## 2016-03-08 DIAGNOSIS — I2510 Atherosclerotic heart disease of native coronary artery without angina pectoris: Secondary | ICD-10-CM | POA: Diagnosis present

## 2016-03-08 DIAGNOSIS — Z95810 Presence of automatic (implantable) cardiac defibrillator: Secondary | ICD-10-CM

## 2016-03-08 DIAGNOSIS — I82409 Acute embolism and thrombosis of unspecified deep veins of unspecified lower extremity: Secondary | ICD-10-CM | POA: Diagnosis not present

## 2016-03-08 DIAGNOSIS — E876 Hypokalemia: Secondary | ICD-10-CM | POA: Diagnosis present

## 2016-03-08 DIAGNOSIS — Z888 Allergy status to other drugs, medicaments and biological substances status: Secondary | ICD-10-CM | POA: Diagnosis not present

## 2016-03-08 DIAGNOSIS — K219 Gastro-esophageal reflux disease without esophagitis: Secondary | ICD-10-CM | POA: Diagnosis present

## 2016-03-08 DIAGNOSIS — I255 Ischemic cardiomyopathy: Secondary | ICD-10-CM | POA: Diagnosis present

## 2016-03-08 DIAGNOSIS — R0602 Shortness of breath: Secondary | ICD-10-CM

## 2016-03-08 DIAGNOSIS — Z79899 Other long term (current) drug therapy: Secondary | ICD-10-CM | POA: Diagnosis not present

## 2016-03-08 DIAGNOSIS — Z9981 Dependence on supplemental oxygen: Secondary | ICD-10-CM

## 2016-03-08 DIAGNOSIS — Z823 Family history of stroke: Secondary | ICD-10-CM | POA: Diagnosis not present

## 2016-03-08 DIAGNOSIS — Z5181 Encounter for therapeutic drug level monitoring: Secondary | ICD-10-CM | POA: Diagnosis not present

## 2016-03-08 DIAGNOSIS — Z800 Family history of malignant neoplasm of digestive organs: Secondary | ICD-10-CM | POA: Diagnosis not present

## 2016-03-08 DIAGNOSIS — Z955 Presence of coronary angioplasty implant and graft: Secondary | ICD-10-CM

## 2016-03-08 DIAGNOSIS — I5023 Acute on chronic systolic (congestive) heart failure: Secondary | ICD-10-CM | POA: Diagnosis not present

## 2016-03-08 DIAGNOSIS — Z86718 Personal history of other venous thrombosis and embolism: Secondary | ICD-10-CM

## 2016-03-08 DIAGNOSIS — J9621 Acute and chronic respiratory failure with hypoxia: Secondary | ICD-10-CM | POA: Diagnosis present

## 2016-03-08 DIAGNOSIS — Z87891 Personal history of nicotine dependence: Secondary | ICD-10-CM | POA: Diagnosis not present

## 2016-03-08 DIAGNOSIS — I480 Paroxysmal atrial fibrillation: Secondary | ICD-10-CM | POA: Diagnosis present

## 2016-03-08 DIAGNOSIS — I509 Heart failure, unspecified: Secondary | ICD-10-CM | POA: Diagnosis not present

## 2016-03-08 DIAGNOSIS — Z8249 Family history of ischemic heart disease and other diseases of the circulatory system: Secondary | ICD-10-CM | POA: Diagnosis not present

## 2016-03-08 DIAGNOSIS — E1122 Type 2 diabetes mellitus with diabetic chronic kidney disease: Secondary | ICD-10-CM | POA: Diagnosis present

## 2016-03-08 DIAGNOSIS — I10 Essential (primary) hypertension: Secondary | ICD-10-CM | POA: Diagnosis not present

## 2016-03-08 DIAGNOSIS — I130 Hypertensive heart and chronic kidney disease with heart failure and stage 1 through stage 4 chronic kidney disease, or unspecified chronic kidney disease: Secondary | ICD-10-CM | POA: Diagnosis present

## 2016-03-08 LAB — CBC WITH DIFFERENTIAL/PLATELET
Basophils Absolute: 0 10*3/uL (ref 0.0–0.1)
Basophils Relative: 0 %
EOS ABS: 0.1 10*3/uL (ref 0.0–0.7)
EOS PCT: 1 %
HCT: 37.2 % — ABNORMAL LOW (ref 39.0–52.0)
Hemoglobin: 12.6 g/dL — ABNORMAL LOW (ref 13.0–17.0)
LYMPHS PCT: 26 %
Lymphs Abs: 1.8 10*3/uL (ref 0.7–4.0)
MCH: 33.4 pg (ref 26.0–34.0)
MCHC: 33.9 g/dL (ref 30.0–36.0)
MCV: 98.7 fL (ref 78.0–100.0)
Monocytes Absolute: 0.7 10*3/uL (ref 0.1–1.0)
Monocytes Relative: 10 %
Neutro Abs: 4.3 10*3/uL (ref 1.7–7.7)
Neutrophils Relative %: 62 %
PLATELETS: 114 10*3/uL — AB (ref 150–400)
RBC: 3.77 MIL/uL — AB (ref 4.22–5.81)
RDW: 15.2 % (ref 11.5–15.5)
WBC: 6.9 10*3/uL (ref 4.0–10.5)

## 2016-03-08 LAB — DIGOXIN LEVEL: DIGOXIN LVL: 0.6 ng/mL — AB (ref 0.8–2.0)

## 2016-03-08 LAB — PROTIME-INR
INR: 2.44
Prothrombin Time: 26.9 seconds — ABNORMAL HIGH (ref 11.4–15.2)

## 2016-03-08 LAB — BASIC METABOLIC PANEL
ANION GAP: 7 (ref 5–15)
BUN: 16 mg/dL (ref 6–20)
CHLORIDE: 100 mmol/L — AB (ref 101–111)
CO2: 32 mmol/L (ref 22–32)
Calcium: 8.9 mg/dL (ref 8.9–10.3)
Creatinine, Ser: 1.4 mg/dL — ABNORMAL HIGH (ref 0.61–1.24)
GFR calc Af Amer: 55 mL/min — ABNORMAL LOW (ref >60)
GFR calc non Af Amer: 47 mL/min — ABNORMAL LOW (ref >60)
GLUCOSE: 101 mg/dL — AB (ref 65–99)
POTASSIUM: 2.8 mmol/L — AB (ref 3.5–5.1)
SODIUM: 139 mmol/L (ref 135–145)

## 2016-03-08 LAB — TROPONIN I: TROPONIN I: 0.05 ng/mL — AB (ref <0.03)

## 2016-03-08 LAB — BRAIN NATRIURETIC PEPTIDE: B NATRIURETIC PEPTIDE 5: 1075 pg/mL — AB (ref 0.0–100.0)

## 2016-03-08 LAB — POCT INR: INR: 2.8

## 2016-03-08 MED ORDER — ENOXAPARIN SODIUM 40 MG/0.4ML ~~LOC~~ SOLN
40.0000 mg | SUBCUTANEOUS | Status: DC
Start: 2016-03-08 — End: 2016-03-08

## 2016-03-08 MED ORDER — ALFUZOSIN HCL ER 10 MG PO TB24
10.0000 mg | ORAL_TABLET | Freq: Every day | ORAL | Status: DC
  Administered 2016-03-09 – 2016-03-10 (×2): 10 mg via ORAL
  Filled 2016-03-08 (×3): qty 1

## 2016-03-08 MED ORDER — ALBUTEROL (5 MG/ML) CONTINUOUS INHALATION SOLN
10.0000 mg/h | INHALATION_SOLUTION | Freq: Once | RESPIRATORY_TRACT | Status: AC
  Administered 2016-03-08: 10 mg/h via RESPIRATORY_TRACT
  Filled 2016-03-08: qty 20

## 2016-03-08 MED ORDER — METHYLPREDNISOLONE SODIUM SUCC 125 MG IJ SOLR
125.0000 mg | Freq: Once | INTRAMUSCULAR | Status: AC
  Administered 2016-03-08: 125 mg via INTRAVENOUS
  Filled 2016-03-08: qty 2

## 2016-03-08 MED ORDER — ALLOPURINOL 100 MG PO TABS
100.0000 mg | ORAL_TABLET | Freq: Every day | ORAL | Status: DC
  Administered 2016-03-09 – 2016-03-10 (×2): 100 mg via ORAL
  Filled 2016-03-08 (×2): qty 1

## 2016-03-08 MED ORDER — METHYLPREDNISOLONE SODIUM SUCC 125 MG IJ SOLR
60.0000 mg | Freq: Two times a day (BID) | INTRAMUSCULAR | Status: DC
  Administered 2016-03-09: 60 mg via INTRAVENOUS
  Filled 2016-03-08: qty 2

## 2016-03-08 MED ORDER — IPRATROPIUM BROMIDE 0.02 % IN SOLN
1.0000 mg | Freq: Once | RESPIRATORY_TRACT | Status: AC
  Administered 2016-03-08: 1 mg via RESPIRATORY_TRACT
  Filled 2016-03-08: qty 5

## 2016-03-08 MED ORDER — GUAIFENESIN ER 600 MG PO TB12
600.0000 mg | ORAL_TABLET | Freq: Two times a day (BID) | ORAL | Status: DC
  Administered 2016-03-08 – 2016-03-10 (×4): 600 mg via ORAL
  Filled 2016-03-08 (×4): qty 1

## 2016-03-08 MED ORDER — TIOTROPIUM BROMIDE MONOHYDRATE 18 MCG IN CAPS
18.0000 ug | ORAL_CAPSULE | Freq: Every day | RESPIRATORY_TRACT | Status: DC
  Administered 2016-03-09 – 2016-03-10 (×2): 18 ug via RESPIRATORY_TRACT
  Filled 2016-03-08: qty 5

## 2016-03-08 MED ORDER — LOSARTAN POTASSIUM 25 MG PO TABS
25.0000 mg | ORAL_TABLET | Freq: Every day | ORAL | Status: DC
  Administered 2016-03-09: 25 mg via ORAL
  Filled 2016-03-08 (×3): qty 1

## 2016-03-08 MED ORDER — MOMETASONE FURO-FORMOTEROL FUM 100-5 MCG/ACT IN AERO
INHALATION_SPRAY | RESPIRATORY_TRACT | Status: AC
  Filled 2016-03-08: qty 8.8

## 2016-03-08 MED ORDER — MOMETASONE FURO-FORMOTEROL FUM 100-5 MCG/ACT IN AERO
2.0000 | INHALATION_SPRAY | Freq: Two times a day (BID) | RESPIRATORY_TRACT | Status: DC
  Administered 2016-03-08 – 2016-03-10 (×4): 2 via RESPIRATORY_TRACT
  Filled 2016-03-08: qty 8.8

## 2016-03-08 MED ORDER — POTASSIUM CHLORIDE CRYS ER 20 MEQ PO TBCR
40.0000 meq | EXTENDED_RELEASE_TABLET | Freq: Once | ORAL | Status: AC
  Administered 2016-03-08: 40 meq via ORAL
  Filled 2016-03-08: qty 2

## 2016-03-08 MED ORDER — POTASSIUM CHLORIDE CRYS ER 20 MEQ PO TBCR
40.0000 meq | EXTENDED_RELEASE_TABLET | Freq: Three times a day (TID) | ORAL | Status: DC
  Administered 2016-03-08 – 2016-03-09 (×2): 40 meq via ORAL
  Filled 2016-03-08 (×2): qty 2

## 2016-03-08 MED ORDER — ATORVASTATIN CALCIUM 10 MG PO TABS
10.0000 mg | ORAL_TABLET | Freq: Every day | ORAL | Status: DC
  Administered 2016-03-08 – 2016-03-09 (×2): 10 mg via ORAL
  Filled 2016-03-08 (×2): qty 1

## 2016-03-08 MED ORDER — FLUTICASONE PROPIONATE 50 MCG/ACT NA SUSP: 2.0000 | Freq: Every evening | NASAL | Status: DC | PRN

## 2016-03-08 MED ORDER — WARFARIN - PHARMACIST DOSING INPATIENT
Status: DC
  Administered 2016-03-09: 16:00:00

## 2016-03-08 MED ORDER — WARFARIN SODIUM 2 MG PO TABS
2.0000 mg | ORAL_TABLET | Freq: Once | ORAL | Status: AC
  Administered 2016-03-08: 2 mg via ORAL
  Filled 2016-03-08: qty 1

## 2016-03-08 MED ORDER — CARVEDILOL 12.5 MG PO TABS
12.5000 mg | ORAL_TABLET | Freq: Two times a day (BID) | ORAL | Status: DC
  Administered 2016-03-08 – 2016-03-10 (×4): 12.5 mg via ORAL
  Filled 2016-03-08 (×4): qty 1

## 2016-03-08 MED ORDER — ALBUTEROL SULFATE (2.5 MG/3ML) 0.083% IN NEBU
2.5000 mg | INHALATION_SOLUTION | RESPIRATORY_TRACT | Status: DC | PRN
  Filled 2016-03-08: qty 3

## 2016-03-08 MED ORDER — ALBUTEROL SULFATE (2.5 MG/3ML) 0.083% IN NEBU
2.5000 mg | INHALATION_SOLUTION | Freq: Four times a day (QID) | RESPIRATORY_TRACT | Status: DC
  Administered 2016-03-08 – 2016-03-10 (×5): 2.5 mg via RESPIRATORY_TRACT
  Filled 2016-03-08 (×5): qty 3

## 2016-03-08 MED ORDER — DIGOXIN 125 MCG PO TABS
0.0625 mg | ORAL_TABLET | Freq: Every day | ORAL | Status: DC
  Administered 2016-03-09: 0.125 mg via ORAL
  Administered 2016-03-10: 0.0625 mg via ORAL
  Filled 2016-03-08 (×5): qty 1

## 2016-03-08 MED ORDER — ONDANSETRON HCL 4 MG/2ML IJ SOLN: 4.0000 mg | Freq: Four times a day (QID) | INTRAMUSCULAR | Status: DC | PRN

## 2016-03-08 MED ORDER — POLYETHYLENE GLYCOL 3350 17 G PO PACK: 17.0000 g | PACK | Freq: Every day | ORAL | Status: DC | PRN

## 2016-03-08 MED ORDER — FUROSEMIDE 10 MG/ML IJ SOLN
40.0000 mg | Freq: Two times a day (BID) | INTRAMUSCULAR | Status: DC
  Administered 2016-03-09 – 2016-03-10 (×3): 40 mg via INTRAVENOUS
  Filled 2016-03-08 (×3): qty 4

## 2016-03-08 MED ORDER — TIOTROPIUM BROMIDE MONOHYDRATE 18 MCG IN CAPS
ORAL_CAPSULE | RESPIRATORY_TRACT | Status: AC
  Filled 2016-03-08: qty 5

## 2016-03-08 MED ORDER — LORATADINE 10 MG PO TABS
10.0000 mg | ORAL_TABLET | Freq: Every day | ORAL | Status: DC
Start: 2016-03-08 — End: 2016-03-10
  Administered 2016-03-09 – 2016-03-10 (×2): 10 mg via ORAL
  Filled 2016-03-08 (×2): qty 1

## 2016-03-08 MED ORDER — ONDANSETRON HCL 4 MG PO TABS: 4.0000 mg | ORAL_TABLET | Freq: Four times a day (QID) | ORAL | Status: DC | PRN

## 2016-03-08 MED ORDER — FUROSEMIDE 10 MG/ML IJ SOLN
40.0000 mg | Freq: Once | INTRAMUSCULAR | Status: AC
  Administered 2016-03-08: 40 mg via INTRAVENOUS
  Filled 2016-03-08: qty 4

## 2016-03-08 NOTE — ED Notes (Signed)
CRITICAL VALUE ALERT  Critical value received:  Troponin 0.05  Date of notification:  03/08/16  Time of notification:  1910  Critical value read back:Yes.   Nurse who received alert:  BKN  MD notified (1st page):  Thurnell Garbe  Time of first page:  1911  MD notified (2nd page):  Time of second page:  Responding MD:   Time MD responded:

## 2016-03-08 NOTE — ED Notes (Signed)
Pt with mild mid to right CP

## 2016-03-08 NOTE — ED Notes (Signed)
Patient transported to X-ray 

## 2016-03-08 NOTE — Progress Notes (Signed)
ANTICOAGULATION CONSULT NOTE - Initial Consult  Pharmacy Consult for Warfarin Indication: atrial fibrillation  Allergies  Allergen Reactions  . Benadryl [Diphenhydramine Hcl] Nausea And Vomiting    Patient Measurements: Height: 5\' 7"  (170.2 cm) Weight: 160 lb (72.6 kg) IBW/kg (Calculated) : 66.1 Heparin Dosing Weight:   Vital Signs: Temp: 98.1 F (36.7 C) (02/16 1513) Temp Source: Oral (02/16 1513) BP: 172/70 (02/16 2030) Pulse Rate: 76 (02/16 2030)  Labs:  Recent Labs  03/08/16 1155 03/08/16 1821  HGB  --  12.6*  HCT  --  37.2*  PLT  --  114*  LABPROT  --  26.9*  INR 2.8 2.44  CREATININE  --  1.40*  TROPONINI  --  0.05*    Estimated Creatinine Clearance: 42 mL/min (by C-G formula based on SCr of 1.4 mg/dL (H)).   Medical History: Past Medical History:  Diagnosis Date  . Atrial fibrillation ([place])   . CAD (coronary artery disease) 1996   status post PCI of the RCA   . Congestive heart failure, unspecified   . COPD (chronic obstructive pulmonary disease) (HCC)    Pt on home O2 at night and PRN, unsure of date of diagnosis.  . Diabetes mellitus, type 2 ([place])   . DJD (degenerative joint disease)   . GERD (gastroesophageal reflux disease)   . Gout   . HTN (hypertension)   . 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 Nephrolithiasis   . Other primary cardiomyopathies   . Personal history of DVT (deep vein thrombosis)   . Prostatitis     Medications:  Prescriptions Prior to Admission  Medication Sig Dispense Refill Last Dose  . acetaminophen (TYLENOL) 500 MG tablet Take 500-1,000 mg by mouth every 6 (six) hours as needed for mild pain.   Past Month at Unknown time  . albuterol (ACCUNEB) 1.25 MG/3ML nebulizer solution Take 3 mLs by nebulization 4 (four) times daily.  2 03/08/2016 at Unknown time  . alfuzosin (UROXATRAL) 10 MG 24 hr tablet Take 10 mg by mouth daily with breakfast.    03/08/2016 at Unknown time  . allopurinol (ZYLOPRIM) 100 MG tablet Take 100 mg  by mouth daily.   03/08/2016 at Unknown time  . atorvastatin (LIPITOR) 10 MG tablet Take 1 tablet (10 mg total) by mouth at bedtime. 90 tablet 1 03/07/2016 at Unknown time  . carvedilol (COREG) 12.5 MG tablet Take 1 tablet (12.5 mg total) by mouth 2 (two) times daily with a meal. 60 tablet 6 03/08/2016 at 7-800a  . cetirizine (ZYRTEC) 10 MG tablet Take 10 mg by mouth daily.   03/08/2016 at Unknown time  . digoxin (LANOXIN) 0.125 MG tablet Take 0.5 tablets (0.0625 mg total) by mouth daily. 45 tablet 3 03/08/2016 at Unknown time  . fluticasone (FLONASE) 50 MCG/ACT nasal spray Place 2 sprays into both nostrils at bedtime as needed for allergies.    unknown  . furosemide (LASIX) 40 MG tablet Take 1 tablet (40 mg total) by mouth daily. 30 tablet 0 03/08/2016 at Unknown time  . losartan (COZAAR) 25 MG tablet Take 1 tablet (25 mg total) by mouth daily. 30 tablet 0 03/08/2016 at Unknown time  . mometasone-formoterol (DULERA) 100-5 MCG/ACT AERO Inhale 2 puffs into the lungs 2 (two) times daily. 1 Inhaler 2 03/08/2016 at Unknown time  . potassium chloride (K-DUR) 10 MEQ tablet Take 1 tablet (10 mEq total) by mouth 2 (two) times daily.   Past Week at Unknown time  . PROAIR HFA 108 (90 BASE)  MCG/ACT inhaler INHALE 2 PUFFS EVERY 4 HOURS AS NEEDED FOR WHEEZING OR SHORTNESS OF BREATH. 8.5 g 1 03/08/2016 at Unknown time  . warfarin (COUMADIN) 1 MG tablet Take 2 tabs daily except 3 tabs on Wednesday & Saturday or as directed by [facility]madin Clinic (Patient taking differently: Take 2mg  by mouth on Tuesday, Thursday, Friday, Sunday. Take 3mg  by mouth on Monday, Wednesday & Saturday as directed by Coumadin Clinic)   03/07/2016 at 1700  . azithromycin (ZITHROMAX Z-PAK) 250 MG tablet Take 1 tablet (250 mg total) by mouth daily. 2 pills (500mg ) day 1 then 1 pill (250mg ) days 2-5. (Patient not taking: Reported on 03/08/2016) 6 tablet 0 Not Taking at Unknown time  . predniSONE (DELTASONE) 20 MG tablet Take 2 tablets (40 mg total) by mouth  daily. (Patient not taking: Reported on 03/08/2016) 10 tablet 0 Not Taking at Unknown time  . predniSONE (DELTASONE) 20 MG tablet Take 2 tablets (40 mg total) by mouth daily. (Patient not taking: Reported on 03/08/2016) 10 tablet 0 Not Taking at Unknown time  . predniSONE (STERAPRED UNI-PAK 21 TAB) 10 MG (21) TBPK tablet Take 10-60 mg by mouth See admin instructions. 6,5,4,3,2,1 starting on 03/08/2016      Infusions:    Assessment: Continuation of Warfarin PTA for AFIB INR therapeutic on admission  Goal of Therapy:  INR 2-3 Monitor platelets by anticoagulation protocol: Yes   Plan:  Coumadin 2 mg po x 1 dose tonight INR/PT daily  Abner Greenspan, Zell [place] 03/08/2016,9:41 PM

## 2016-03-08 NOTE — H&P (Signed)
History and Physical  [name]:[id number] DOB: 04/04/1940 DOA: 03/08/2016  Referring physician: Dr Thurnell Garbe, ED physician PCP: Barbette Merino, MD  Outpatient Specialists:   Dr Lovena Le (Cards)   Patient Coming From: home  Chief Complaint: SOB  HPI: Troy [name] is a 76 y.o. male with a history of COPD, CAD, Systolic CHF with EF of 71-69% on 11/2013, DM2 diet controlled, GERD, A fib, pacemaker - dually A-V paced. Patient presents with SOB that started 1 week ago and gradually progressed. Persistent cough without sputum production. MDI and nebs provide transient relief. Denies weight gain, minimal peripheral edema. Has had multiple admissions in past. No other provoking or palliating factors. Did see his PCP, who prescribed steroids, but hadn't started them yet.  Emergency Department Course: Hour long neb, steroids, lasix in ED. CXR showed pulm edema. Patient desated on stretcher  Review of Systems:   Pt denies any fevers, chills, nausea, vomiting, diarrhea, constipation, abdominal pain, palpitations, headache, vision changes, lightheadedness, dizziness, melena, rectal bleeding.  Review of systems are otherwise negative  Past Medical History:  Diagnosis Date  . Atrial fibrillation ([place])   . CAD (coronary artery disease) 1996   status post PCI of the RCA   . Congestive heart failure, unspecified   . COPD (chronic obstructive pulmonary disease) (HCC)    Pt on home O2 at night and PRN, unsure of date of diagnosis.  . Diabetes mellitus, type 2 ([place])   . DJD (degenerative joint disease)   . GERD (gastroesophageal reflux disease)   . Gout   . HTN (hypertension)   . 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 Nephrolithiasis   . Other primary cardiomyopathies   . Personal history of DVT (deep vein thrombosis)   . Prostatitis    Past Surgical History:  Procedure Laterality Date  . BACK SURGERY    . CARDIAC DEFIBRILLATOR PLACEMENT    . CORONARY STENT PLACEMENT    .  PACEMAKER INSERTION     Social History:  reports that he quit smoking about 36 years ago. His smoking use included Cigarettes. He has a 200.00 pack-year smoking history. He has never used smokeless tobacco. He reports that he does not drink alcohol or use drugs. Patient lives at home  Allergies  Allergen Reactions  . Benadryl [Diphenhydramine Hcl] Nausea And Vomiting    Family History  Problem Relation Age of Onset  . Heart disease Father   . Cancer Father     LUNG  . Colon cancer Mother   . Stroke Paternal Uncle   . Heart attack Neg Hx   . Hyperlipidemia Neg Hx   . Hypertension Neg Hx      Prior to Admission medications   Medication Sig Start Date End Date Taking? Authorizing Provider  acetaminophen (TYLENOL) 500 MG tablet Take 500-1,000 mg by mouth every 6 (six) hours as needed for mild pain.   Yes Historical Provider, MD  albuterol (ACCUNEB) 1.25 MG/3ML nebulizer solution Take 3 mLs by nebulization 4 (four) times daily. 08/21/15  Yes Historical Provider, MD  alfuzosin (UROXATRAL) 10 MG 24 hr tablet Take 10 mg by mouth daily with breakfast.    Yes Historical Provider, MD  allopurinol (ZYLOPRIM) 100 MG tablet Take 100 mg by mouth daily.   Yes Historical Provider, MD  atorvastatin (LIPITOR) 10 MG tablet Take 1 tablet (10 mg total) by mouth at bedtime. 08/03/14  Yes Jettie Booze, MD  carvedilol (COREG) 12.5 MG tablet Take 1 tablet (12.5 mg total) by mouth  2 (two) times daily with a meal. 03/03/15  Yes Jettie Booze, MD  cetirizine (ZYRTEC) 10 MG tablet Take 10 mg by mouth daily.   Yes Historical Provider, MD  digoxin (LANOXIN) 0.125 MG tablet Take 0.5 tablets (0.0625 mg total) by mouth daily. 06/23/15  Yes Jettie Booze, MD  fluticasone (FLONASE) 50 MCG/ACT nasal spray Place 2 sprays into both nostrils at bedtime as needed for allergies.  04/07/13  Yes Historical Provider, MD  furosemide (LASIX) 40 MG tablet Take 1 tablet (40 mg total) by mouth daily. 10/09/15  Yes Jola Schmidt, MD  losartan (COZAAR) 25 MG tablet Take 1 tablet (25 mg total) by mouth daily. 10/09/15  Yes Jola Schmidt, MD  mometasone-formoterol [facility]) 100-5 MCG/ACT AERO Inhale 2 puffs into the lungs 2 (two) times daily. 09/13/15  Yes Geradine Girt, DO  potassium chloride (K-DUR) 10 MEQ tablet Take 1 tablet (10 mEq total) by mouth 2 (two) times daily. 09/13/15  Yes Jessica U Vann, DO  PROAIR HFA 108 (90 BASE) MCG/ACT inhaler INHALE 2 PUFFS EVERY 4 HOURS AS NEEDED FOR WHEEZING OR SHORTNESS OF BREATH. 09/05/14  Yes Collene Gobble, MD  warfarin (COUMADIN) 1 MG tablet Take 2 tabs daily except 3 tabs on Wednesday & Saturday or as directed by [facility] Patient taking differently: Take 2mg  by mouth on Tuesday, Thursday, Friday, Sunday. Take 3mg  by mouth on Monday, Wednesday & Saturday as directed by [facility] 09/13/15  Yes Jessica U Vann, DO  azithromycin (ZITHROMAX Z-PAK) 250 MG tablet Take 1 tablet (250 mg total) by mouth daily. 2 pills (500mg ) day 1 then 1 pill (250mg ) days 2-5. Patient not taking: Reported on 03/08/2016 12/06/15   Virgel Manifold, MD  predniSONE (DELTASONE) 20 MG tablet Take 2 tablets (40 mg total) by mouth daily. Patient not taking: Reported on 03/08/2016 12/06/15   Virgel Manifold, MD  predniSONE (DELTASONE) 20 MG tablet Take 2 tablets (40 mg total) by mouth daily. Patient not taking: Reported on 03/08/2016 12/06/15   Virgel Manifold, MD  predniSONE (STERAPRED UNI-PAK 21 TAB) 10 MG (21) TBPK tablet Take 10-60 mg by mouth See admin instructions. 6,5,4,3,2,1 starting on 03/08/2016 03/08/16   Historical Provider, MD    Physical Exam: BP 172/70   Pulse 76   Temp 98.1 F (36.7 C) (Oral)   Resp 15   Ht 5\' 7"  (1.702 m)   Wt 72.6 kg (160 lb)   SpO2 96%   BMI 25.06 kg/m   General: Elderly caucasian male. Awake and alert and oriented x3. No acute cardiopulmonary distress.  HEENT: Normocephalic atraumatic.  Right and left ears normal in appearance.  Pupils equal, round, reactive to  light. Extraocular muscles are intact. Sclerae anicteric and noninjected.  Moist mucosal membranes. No mucosal lesions.  Neck: Neck supple without lymphadenopathy. No carotid bruits. No masses palpated.  Cardiovascular: Regular rate with normal S1-S2 sounds. No murmurs, rubs, gallops auscultated. No JVD.  Respiratory: Prolonged exhalation phase. Rales in bases. No wheezes on my exam, although heard by EDP. Abdomen: Soft, nontender, nondistended. Active bowel sounds. No masses or hepatosplenomegaly  Skin: No rashes, lesions, or ulcerations.  Dry, warm to touch. 2+ dorsalis pedis and radial pulses. Musculoskeletal: No calf or leg pain. All major joints not erythematous nontender.  No upper or lower joint deformation.  Good ROM.  No contractures  Psychiatric: Intact judgment and insight. Pleasant and cooperative. Neurologic: No focal neurological deficits. Strength is 5/5 and symmetric in upper and lower extremities.  Cranial  nerves II through XII are grossly intact.           Labs on Admission: I have personally reviewed following labs and imaging studies  CBC:  Recent Labs Lab 03/08/16 1821  WBC 6.9  NEUTROABS 4.3  HGB 12.6*  HCT 37.2*  MCV 98.7  PLT 389*   Basic Metabolic Panel:  Recent Labs Lab 03/08/16 1821  NA 139  K 2.8*  CL 100*  CO2 32  GLUCOSE 101*  BUN 16  CREATININE 1.40*  CALCIUM 8.9   GFR: Estimated Creatinine Clearance: 42 mL/min (by C-G formula based on SCr of 1.4 mg/dL (H)). Liver Function Tests: No results for input(s): AST, ALT, ALKPHOS, BILITOT, PROT, ALBUMIN in the last 168 hours. No results for input(s): LIPASE, AMYLASE in the last 168 hours. No results for input(s): AMMONIA in the last 168 hours. Coagulation Profile:  Recent Labs Lab 03/08/16 1155 03/08/16 1821  INR 2.8 2.44   Cardiac Enzymes:  Recent Labs Lab 03/08/16 1821  TROPONINI 0.05*   BNP (last 3 results) No results for input(s): PROBNP in the last 8760 hours. HbA1C: No  results for input(s): HGBA1C in the last 72 hours. CBG: No results for input(s): GLUCAP in the last 168 hours. Lipid Profile: No results for input(s): CHOL, HDL, LDLCALC, TRIG, CHOLHDL, LDLDIRECT in the last 72 hours. Thyroid Function Tests: No results for input(s): TSH, T4TOTAL, FREET4, T3FREE, THYROIDAB in the last 72 hours. Anemia Panel: No results for input(s): VITAMINB12, FOLATE, FERRITIN, TIBC, IRON, RETICCTPCT in the last 72 hours. Urine analysis:    Component Value Date/Time   COLORURINE YELLOW 12/15/2015 2154   APPEARANCEUR CLEAR 12/15/2015 2154   LABSPEC 1.009 12/15/2015 2154   PHURINE 5.0 12/15/2015 2154   GLUCOSEU NEGATIVE 12/15/2015 2154   HGBUR NEGATIVE 12/15/2015 2154   BILIRUBINUR NEGATIVE 12/15/2015 2154   KETONESUR NEGATIVE 12/15/2015 2154   PROTEINUR NEGATIVE 12/15/2015 2154   UROBILINOGEN 0.2 11/21/2013 1946   NITRITE NEGATIVE 12/15/2015 2154   LEUKOCYTESUR NEGATIVE 12/15/2015 2154   Sepsis Labs: @LABRCNTIP (procalcitonin:4,lacticidven:4) )No results found for this or any previous visit (from the past 240 hour(s)).   Radiological Exams on Admission: Dg Chest 2 View  Result Date: 03/08/2016 CLINICAL DATA:  Shortness breath with wheezing and nonproductive cough for 1 week. History of congestive heart failure and diabetes. EXAM: CHEST  2 VIEW COMPARISON:  12/15/2015 and 12/06/2015. FINDINGS: The heart size and mediastinal contours are stable. Left subclavian AICD leads are unchanged within the right atrium, right ventricle and coronary sinus. There is interval increased interstitial prominence at both lung bases suspicious for mild edema. There are small bilateral pleural effusions. No confluent airspace opacity or pneumothorax identified. There are stable degenerative changes throughout the spine. IMPRESSION: New interstitial prominence in both lung bases suspicious for edema with small bilateral pleural effusions, consistent with mild congestive heart failure.  Electronically Signed   By: Richardean Sale M.D.   On: 03/08/2016 19:57    EKG: Independently reviewed. A-V dually paced.  Assessment/Plan: Active Problems:   Chronic atrial fibrillation (HCC)   Chronic kidney disease, stage 3   Chronic obstructive pulmonary disease with acute exacerbation (HCC)   Acute exacerbation of CHF (congestive heart failure) (HCC)   Acute on chronic respiratory failure with hypoxia and hypercapnia ([place])    This patient was discussed with the ED physician, including pertinent vitals, physical exam findings, labs, and imaging.  We also discussed care given by the ED provider.  #1 Acute on Chronic resp failure with hypoxia  Admit  Tele  O2 #2 Acute CHF Telemetry monitoring Strict I/O Daily Weights Diuresis: Lasix 40mg  BID Potassium: 40 mEq three a day by mouth Echo cardiac exam tomorrow Repeat BMP tomorrow #3 COPD exacerbation  Antibiotics: none  DuoNeb's every 6 scheduled with albuterol every 2 when necessary  Continue inhaled steroids and LA bronchodilator  Solu-Medrol 60 mg IV every 12 hours  Mucinex #4 CKD stage 3  Renally dose medications #5 A fib  Pharmacy consult for coumadin management  DVT prophylaxis: Coumadin Consultants: none Code Status: FUll Family Communication: None  Disposition Plan: patient should be able to return home following admission   Truett Mainland, DO [facility] Pager [phone]  If 7PM-7AM, please contact night-coverage [email or web address] Password [email or web address]

## 2016-03-08 NOTE — ED Provider Notes (Signed)
[facility] DEPT Provider Note   CSN: [id number] Arrival date & time: 03/08/16  1454     History   Chief Complaint Chief Complaint  Patient presents with  . Shortness of Breath    for the last week    HPI Troy [name] is a 76 y.o. male.  HPI  Pt was seen at 1800.  Per pt, c/o gradual onset and worsening of persistent cough, wheezing and SOB for the past 1 week. Has been using home MDI and nebs with transient relief. Has been associated with generalized chest "tightness" and peripheral edema.  Pt endorses hx of COPD and CHF.  Denies palpitations, no back pain, no abd pain, no N/V/D, no fevers, no rash.    Past Medical History:  Diagnosis Date  . Atrial fibrillation ([place])   . CAD (coronary artery disease) 1996   status post PCI of the RCA   . Congestive heart failure, unspecified   . COPD (chronic obstructive pulmonary disease) (HCC)    Pt on home O2 at night and PRN, unsure of date of diagnosis.  . Diabetes mellitus, type 2 ([place])   . DJD (degenerative joint disease)   . GERD (gastroesophageal reflux disease)   . Gout   . HTN (hypertension)   . 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 Nephrolithiasis   . Other primary cardiomyopathies   . Personal history of DVT (deep vein thrombosis)   . Prostatitis     Patient Active Problem List   Diagnosis Date Noted  . Lower GI bleed 09/11/2015  . Bright red blood per rectum 09/11/2015  . Community acquired pneumonia 08/24/2015  . Acute exacerbation of chronic obstructive pulmonary disease (COPD) ([place]) 03/05/2015  . Acute renal failure superimposed on stage 3 chronic kidney disease ([place]) 03/05/2015  . COPD with acute exacerbation ([place]) 03/05/2015  . Renal failure (ARF), acute on chronic (HCC)   . Stomach ache 01/18/2015  . COPD exacerbation ([place])   . AKI (acute kidney injury) ([place]) 12/26/2014  . BPH (benign prostatic hyperplasia) 12/25/2014  . FTT (failure to thrive) in adult 12/25/2014  . Encounter for therapeutic  drug monitoring 08/05/2014  . Pulmonary nodules 12/23/2013  . Hemoptysis   . Dyspnea   . Acute on chronic systolic CHF (congestive heart failure) ([place])   . Chronic kidney disease, stage 3   . Coronary artery disease involving native coronary artery of native heart without angina pectoris   . Paroxysmal atrial fibrillation (HCC)   . Frequent PVCs   . Hypoxia   . Acute on chronic respiratory failure with hypoxia ([place])   . Chronic obstructive pulmonary disease with acute exacerbation ([place])   . COPD (chronic obstructive pulmonary disease) ([place]) 06/16/2013  . Lipoma of neck 09/11/2011  . 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 Coronary artery disease 01/30/2011  . Chronic atrial fibrillation ([place]) 01/30/2011  . Chronic systolic CHF (congestive heart failure) ([place])   . Other primary cardiomyopathies   . Diverticulosis of colon with hemorrhage 08/28/2010    Past Surgical History:  Procedure Laterality Date  . BACK SURGERY    . CARDIAC DEFIBRILLATOR PLACEMENT    . CORONARY STENT PLACEMENT    . PACEMAKER INSERTION         Home Medications    Prior to Admission medications   Medication Sig Start Date End Date Taking? Authorizing Provider  acetaminophen (TYLENOL) 500 MG tablet Take 500-1,000 mg by mouth every 6 (six) hours as needed for mild pain.  Historical Provider, MD  albuterol (ACCUNEB) 1.25 MG/3ML nebulizer solution Take 3 mLs by nebulization 4 (four) times daily. 08/21/15   Historical Provider, MD  alfuzosin (UROXATRAL) 10 MG 24 hr tablet Take 10 mg by mouth daily with breakfast.     Historical Provider, MD  allopurinol (ZYLOPRIM) 100 MG tablet Take 100 mg by mouth daily.    Historical Provider, MD  atorvastatin (LIPITOR) 10 MG tablet Take 1 tablet (10 mg total) by mouth at bedtime. 08/03/14   Jettie Booze, MD  azithromycin (ZITHROMAX Z-PAK) 250 MG tablet Take 1 tablet (250 mg total) by mouth daily. 2 pills (500mg ) day 1 then 1 pill  (250mg ) days 2-5. 12/06/15   Virgel Manifold, MD  carvedilol (COREG) 12.5 MG tablet Take 1 tablet (12.5 mg total) by mouth 2 (two) times daily with a meal. 03/03/15   Jettie Booze, MD  cetirizine (ZYRTEC) 10 MG tablet Take 10 mg by mouth daily.    Historical Provider, MD  digoxin (LANOXIN) 0.125 MG tablet Take 0.5 tablets (0.0625 mg total) by mouth daily. 06/23/15   Jettie Booze, MD  fluticasone (FLONASE) 50 MCG/ACT nasal spray Place 2 sprays into both nostrils daily as needed for allergies.  04/07/13   Historical Provider, MD  furosemide (LASIX) 40 MG tablet Take 1 tablet (40 mg total) by mouth daily. 10/09/15   Jola Schmidt, MD  losartan (COZAAR) 25 MG tablet Take 1 tablet (25 mg total) by mouth daily. 10/09/15   Jola Schmidt, MD  mometasone-formoterol [facility]) 100-5 MCG/ACT AERO Inhale 2 puffs into the lungs 2 (two) times daily. 09/13/15   Geradine Girt, DO  potassium chloride (K-DUR) 10 MEQ tablet Take 1 tablet (10 mEq total) by mouth 2 (two) times daily. 09/13/15   Geradine Girt, DO  predniSONE (DELTASONE) 20 MG tablet Take 2 tablets (40 mg total) by mouth daily. 12/06/15   Virgel Manifold, MD  predniSONE (DELTASONE) 20 MG tablet Take 2 tablets (40 mg total) by mouth daily. 12/06/15   Virgel Manifold, MD  PROAIR HFA 108 (90 BASE) MCG/ACT inhaler INHALE 2 PUFFS EVERY 4 HOURS AS NEEDED FOR WHEEZING OR SHORTNESS OF BREATH. 09/05/14   Collene Gobble, MD  warfarin (COUMADIN) 1 MG tablet Take 2 tabs daily except 3 tabs on Wednesday & Saturday or as directed by [facility] Clinic Patient taking differently: Take 2mg  by mouth on Tuesday, Thursday, Friday, Sunday. Take 3mg  by mouth on Monday, Wednesday & Saturday as directed by [facility]madin Clinic 09/13/15   Geradine Girt, DO    Family History Family History  Problem Relation Age of Onset  . Heart disease Father   . Cancer Father     LUNG  . Colon cancer Mother   . Stroke Paternal Uncle   . Heart attack Neg Hx   . Hyperlipidemia Neg Hx   .  Hypertension Neg Hx     Social History Social History  Substance Use Topics  . Smoking status: Former Smoker    Packs/day: 4.00    Years: 50.00    Types: Cigarettes    Quit date: 01/22/1980  . Smokeless tobacco: Never Used  . Alcohol use No     Comment: denies     Allergies   Benadryl [diphenhydramine hcl]   Review of Systems Review of Systems ROS: Statement: All systems negative except as marked or noted in the HPI; Constitutional: Negative for fever and chills. ; ; Eyes: Negative for eye pain, redness and discharge. ; ; ENMT: Negative  for ear pain, hoarseness, nasal congestion, sinus pressure and sore throat. ; ; Cardiovascular: Negative for palpitations, diaphoresis, and +peripheral edema. ; ; Respiratory: +cough, wheezing, SOB. Negative for stridor. ; ; Gastrointestinal: Negative for nausea, vomiting, diarrhea, abdominal pain, blood in stool, hematemesis, jaundice and rectal bleeding. . ; ; Genitourinary: Negative for dysuria, flank pain and hematuria. ; ; Musculoskeletal: Negative for back pain and neck pain. Negative for swelling and trauma.; ; Skin: Negative for pruritus, rash, abrasions, blisters, bruising and skin lesion.; ; Neuro: Negative for headache, lightheadedness and neck stiffness. Negative for weakness, altered level of consciousness, altered mental status, extremity weakness, paresthesias, involuntary movement, seizure and syncope.       Physical Exam Updated Vital Signs BP 154/64 (BP Location: Left Arm)   Pulse 69   Temp 98.1 F (36.7 C) (Oral)   Resp 18   Ht 5\' 7"  (1.702 m)   Wt 160 lb (72.6 kg)   SpO2 97%   BMI 25.06 kg/m   Physical Exam 1805: Physical examination:  Nursing notes reviewed; Vital signs and O2 SAT reviewed;  Constitutional: Well developed, Well nourished, Well hydrated, Mildly uncomfortable appearing.; Head:  Normocephalic, atraumatic; Eyes: EOMI, PERRL, No scleral icterus; ENMT: Mouth and pharynx normal, Mucous membranes moist; Neck:  Supple, Full range of motion, No lymphadenopathy; Cardiovascular: Regular rate and rhythm, No gallop; Respiratory: Breath sounds diminished & equal bilaterally, faint scattered wheezes. No audible wheezing. Speaking short sentences, sitting upright. Normal respiratory effort/excursion; Chest: Nontender, Movement normal; Abdomen: Soft, Nontender, Nondistended, Normal bowel sounds; Genitourinary: No CVA tenderness; Extremities: Pulses normal, No tenderness, +2 pedal edema bilat. No calf asymmetry.; Neuro: AA&Ox3, Major CN grossly intact.  Speech clear. No gross focal motor or sensory deficits in extremities.; Skin: Color normal, Warm, Dry.   ED Treatments / Results  Labs (all labs ordered are listed, but only abnormal results are displayed)   EKG  EKG Interpretation  Date/Time:  Friday March 08 2016 18:13:53 EST Ventricular Rate:  73 PR Interval:    QRS Duration: 133 QT Interval:  435 QTC Calculation: 453 R Axis:   -112 Text Interpretation:  A-V dual-paced complexes w/ some inhibition No further analysis attempted due to paced rhythm Baseline wander When compared with ECG of 12/15/2015 No significant change was found Confirmed by [facility]  MD, Nunzio Cory [phone]) on 03/08/2016 6:17:29 PM       Radiology   Procedures Procedures (including critical care time)  Medications Ordered in ED Medications  methylPREDNISolone sodium succinate (SOLU-MEDROL) 125 mg/2 mL injection 125 mg (not administered)  albuterol (PROVENTIL,VENTOLIN) solution continuous neb (10 mg/hr Nebulization Given 03/08/16 1826)  ipratropium (ATROVENT) nebulizer solution 1 mg (1 mg Nebulization Given 03/08/16 1825)     Initial Impression / Assessment and Plan / ED Course  I have reviewed the triage vital signs and the nursing notes.  Pertinent labs & imaging results that were available during my care of the patient were reviewed by me and considered in my medical decision making (see chart for details).  MDM Reviewed:  previous chart, nursing note and vitals Reviewed previous: labs and ECG Interpretation: labs, ECG and x-ray Total time providing critical care: 30-74 minutes. This excludes time spent performing separately reportable procedures and services. Consults: admitting MD   CRITICAL CARE Performed by: Alfonzo Feller Total critical care time: 35 minutes Critical care time was exclusive of separately billable procedures and treating other patients. Critical care was necessary to treat or prevent imminent or life-threatening deterioration. Critical care was time spent personally  by me on the following activities: development of treatment plan with patient and/or surrogate as well as nursing, discussions with consultants, evaluation of patient's response to treatment, examination of patient, obtaining history from patient or surrogate, ordering and performing treatments and interventions, ordering and review of laboratory studies, ordering and review of radiographic studies, pulse oximetry and re-evaluation of patient's condition.   Results for orders placed or performed during the hospital encounter of 03/08/16  CBC with Differential  Result Value Ref Range   WBC 6.9 4.0 - 10.5 K/uL   RBC 3.77 (L) 4.22 - 5.81 MIL/uL   Hemoglobin 12.6 (L) 13.0 - 17.0 g/dL   HCT 37.2 (L) 39.0 - 52.0 %   MCV 98.7 78.0 - 100.0 fL   MCH 33.4 26.0 - 34.0 pg   MCHC 33.9 30.0 - 36.0 g/dL   RDW 15.2 11.5 - 15.5 %   Platelets 114 (L) 150 - 400 K/uL   Neutrophils Relative % 62 %   Neutro Abs 4.3 1.7 - 7.7 K/uL   Lymphocytes Relative 26 %   Lymphs Abs 1.8 0.7 - 4.0 K/uL   Monocytes Relative 10 %   Monocytes Absolute 0.7 0.1 - 1.0 K/uL   Eosinophils Relative 1 %   Eosinophils Absolute 0.1 0.0 - 0.7 K/uL   Basophils Relative 0 %   Basophils Absolute 0.0 0.0 - 0.1 K/uL  Troponin I  Result Value Ref Range   Troponin I 0.05 (HH) <0.03 ng/mL  Brain natriuretic peptide  Result Value Ref Range   B Natriuretic Peptide  1,075.0 (H) 0.0 - 100.0 pg/mL  Basic metabolic panel  Result Value Ref Range   Sodium 139 135 - 145 mmol/L   Potassium 2.8 (L) 3.5 - 5.1 mmol/L   Chloride 100 (L) 101 - 111 mmol/L   CO2 32 22 - 32 mmol/L   Glucose, Bld 101 (H) 65 - 99 mg/dL   BUN 16 6 - 20 mg/dL   Creatinine, Ser 1.40 (H) 0.61 - 1.24 mg/dL   Calcium 8.9 8.9 - 10.3 mg/dL   GFR calc non Af Amer 47 (L) >60 mL/min   GFR calc Af Amer 55 (L) >60 mL/min   Anion gap 7 5 - 15  Protime-INR  Result Value Ref Range   Prothrombin Time 26.9 (H) 11.4 - 15.2 seconds   INR 2.44    Results for LYDIA, MENG (MRN [id number]) as of 03/08/2016 19:20  Ref. Range 10/05/2015 12:18 10/09/2015 11:28 12/06/2015 12:11 12/15/2015 19:17 03/08/2016 18:21  Hemoglobin Latest Ref Range: 13.0 - 17.0 g/dL 12.4 (L) 12.6 (L) 13.3 14.5 12.6 (L)  HCT Latest Ref Range: 39.0 - 52.0 % 37.3 (L) 37.6 (L) 40.7 42.7 37.2 (L)  Platelets Latest Ref Range: 150 - 400 K/uL 129 (L) 121 (L) 112 (L) 92 (L) 114 (L)   Results for SHANN, MERRICK (MRN [id number]) as of 03/08/2016 19:20  Ref. Range 04/21/2014 10:16 12/25/2014 16:41 12/25/2014 22:59 12/26/2014 04:10 03/08/2016 18:21  Troponin I Latest Ref Range: <0.03 ng/mL 0.04 (H) 0.08 (H) 0.07 (H) 0.06 (H) 0.05 (HH)   Results for ANTONIE, BORJON (MRN [id number]) as of 03/08/2016 19:20  Ref. Range 12/25/2014 08:07 01/04/2015 08:25 03/05/2015 16:05 12/15/2015 19:17 03/08/2016 18:21  B Natriuretic Peptide Latest Ref Range: 0.0 - 100.0 pg/mL 1,078.9 (H) 188.0 (H) 220.7 (H) 244.0 (H) 1,075.0 (H)    Dg Chest 2 View Result Date: 03/08/2016 CLINICAL DATA:  Shortness breath with wheezing and nonproductive cough for 1 week. History of congestive heart  failure and diabetes. EXAM: CHEST  2 VIEW COMPARISON:  12/15/2015 and 12/06/2015. FINDINGS: The heart size and mediastinal contours are stable. Left subclavian AICD leads are unchanged within the right atrium, right ventricle and coronary sinus. There is interval increased interstitial  prominence at both lung bases suspicious for mild edema. There are small bilateral pleural effusions. No confluent airspace opacity or pneumothorax identified. There are stable degenerative changes throughout the spine. IMPRESSION: New interstitial prominence in both lung bases suspicious for edema with small bilateral pleural effusions, consistent with mild congestive heart failure. Electronically Signed   By: Richardean Sale M.D.   On: 03/08/2016 19:57     2025:  On arrival: Pt with decreased lung sounds, hx CHF and COPD. IV solumedrol and hour long neb started. BNP elevated and CXR with acute CHF; IV lasix given. Troponin chronically elevated. H/H and platelets per baseline. As pt sitting on stretcher talking with me, O2 Sats dropped from 96% R/A to 90% R/A; will not ambulate.  Dx and testing d/w pt.  Questions answered.  Verb understanding, agreeable to admit. T/C to [facility]. [name], case discussed, including:  HPI, pertinent PM/SHx, VS/PE, dx testing, ED course and treatment:  Agreeable to admit.     Final Clinical Impressions(s) / ED Diagnoses   Final diagnoses:  None    New Prescriptions New Prescriptions   No medications on file     Francine Graven, DO 03/11/16 1535

## 2016-03-08 NOTE — ED Triage Notes (Signed)
Trouble breathing for the last week - Pt reports that he has oxygen at home, but he hasnt used it- began as wheezing- last treatment at 1030 today and using his proair Dr Everlene Farrier is his physician

## 2016-03-09 ENCOUNTER — Inpatient Hospital Stay (HOSPITAL_COMMUNITY): Payer: Medicare Other

## 2016-03-09 DIAGNOSIS — E876 Hypokalemia: Secondary | ICD-10-CM

## 2016-03-09 DIAGNOSIS — I509 Heart failure, unspecified: Secondary | ICD-10-CM

## 2016-03-09 DIAGNOSIS — I10 Essential (primary) hypertension: Secondary | ICD-10-CM

## 2016-03-09 LAB — ECHOCARDIOGRAM COMPLETE
AV Mean grad: 7 mmHg
AV area mean vel ind: 1.06 cm2/m2
AV pk vel: 193 cm/s
AV vel: 1.66
AVA: 1.66 cm2
AVAREAMEANV: 2.05 cm2
AVAREAVTI: 1.85 cm2
AVAREAVTIIND: 0.86 cm2/m2
AVCELMEANRAT: 0.59
AVLVOTPG: 4 mmHg
AVPG: 15 mmHg
Ao pk vel: 0.53 m/s
CHL CUP AV PEAK INDEX: 0.96
CHL CUP AV VALUE AREA INDEX: 0.86
CHL CUP DOP CALC LVOT VTI: 22.1 cm
CHL CUP STROKE VOLUME: 42 mL
DOP CAL AO MEAN VELOCITY: 118 cm/s
EERAT: 17.74
EWDT: 236 ms
FS: 13 % — AB (ref 28–44)
HEIGHTINCHES: 67 in
IVS/LV PW RATIO, ED: 1.02
LA ID, A-P, ES: 52 mm
LA diam end sys: 52 mm
LA diam index: 2.7 cm/m2
LA vol A4C: 59.1 ml
LAVOL: 67.4 mL
LAVOLIN: 35 mL/m2
LV E/e' medial: 17.74
LV E/e'average: 17.74
LV PW d: 11 mm — AB (ref 0.6–1.1)
LV TDI E'LATERAL: 4.24
LV TDI E'MEDIAL: 3.37
LV dias vol: 122 mL (ref 62–150)
LV e' LATERAL: 4.24 cm/s
LV sys vol: 80 mL — AB (ref 21–61)
LVDIAVOLIN: 63 mL/m2
LVOT SV: 76 mL
LVOT area: 3.46 cm2
LVOT peak VTI: 0.48 cm
LVOT peak vel: 103 cm/s
LVOTD: 21 mm
LVSYSVOLIN: 42 mL/m2
Lateral S' vel: 13.1 cm/s
MV Dec: 236
MV pk A vel: 131 m/s
MVPG: 2 mmHg
MVPKEVEL: 75.2 m/s
P 1/2 time: 701 ms
RV TAPSE: 22.3 mm
RV sys press: 53 mmHg
Reg peak vel: 352 cm/s
Simpson's disk: 34
TR max vel: 352 cm/s
VTI: 46.2 cm
WEIGHTICAEL: 2730.18 [oz_av]

## 2016-03-09 LAB — CBC
HEMATOCRIT: 39.4 % (ref 39.0–52.0)
HEMOGLOBIN: 13.2 g/dL (ref 13.0–17.0)
MCH: 33 pg (ref 26.0–34.0)
MCHC: 33.5 g/dL (ref 30.0–36.0)
MCV: 98.5 fL (ref 78.0–100.0)
Platelets: 118 10*3/uL — ABNORMAL LOW (ref 150–400)
RBC: 4 MIL/uL — AB (ref 4.22–5.81)
RDW: 15.1 % (ref 11.5–15.5)
WBC: 5.7 10*3/uL (ref 4.0–10.5)

## 2016-03-09 LAB — BASIC METABOLIC PANEL
ANION GAP: 9 (ref 5–15)
BUN: 19 mg/dL (ref 6–20)
CALCIUM: 9 mg/dL (ref 8.9–10.3)
CO2: 30 mmol/L (ref 22–32)
Chloride: 103 mmol/L (ref 101–111)
Creatinine, Ser: 1.38 mg/dL — ABNORMAL HIGH (ref 0.61–1.24)
GFR, EST AFRICAN AMERICAN: 56 mL/min — AB (ref >60)
GFR, EST NON AFRICAN AMERICAN: 48 mL/min — AB (ref >60)
Glucose, Bld: 168 mg/dL — ABNORMAL HIGH (ref 65–99)
Potassium: 3.7 mmol/L (ref 3.5–5.1)
SODIUM: 142 mmol/L (ref 135–145)

## 2016-03-09 LAB — PROTIME-INR
INR: 2.19
PROTHROMBIN TIME: 24.7 s — AB (ref 11.4–15.2)

## 2016-03-09 LAB — INFLUENZA PANEL BY PCR (TYPE A & B)
INFLBPCR: NEGATIVE
Influenza A By PCR: NEGATIVE

## 2016-03-09 MED ORDER — POTASSIUM CHLORIDE CRYS ER 20 MEQ PO TBCR
40.0000 meq | EXTENDED_RELEASE_TABLET | Freq: Every day | ORAL | Status: DC
  Administered 2016-03-10: 40 meq via ORAL
  Filled 2016-03-09: qty 2

## 2016-03-09 MED ORDER — WARFARIN SODIUM 1 MG PO TABS
3.0000 mg | ORAL_TABLET | Freq: Once | ORAL | Status: AC
  Administered 2016-03-09: 3 mg via ORAL
  Filled 2016-03-09: qty 1

## 2016-03-09 MED ORDER — PREDNISONE 10 MG PO TABS
50.0000 mg | ORAL_TABLET | Freq: Every day | ORAL | Status: DC
  Administered 2016-03-10: 08:00:00 50 mg via ORAL
  Filled 2016-03-09: qty 2

## 2016-03-09 NOTE — Progress Notes (Signed)
ANTICOAGULATION CONSULT NOTE -   Pharmacy Consult for Warfarin Indication: atrial fibrillation  Allergies  Allergen Reactions  . Benadryl [Diphenhydramine Hcl] Nausea And Vomiting    Patient Measurements: Height: 5\' 7"  (170.2 cm) Weight: 170 lb 10.2 oz (77.4 kg) IBW/kg (Calculated) : 66.1 Heparin Dosing Weight:   Vital Signs: Temp: 97.7 F (36.5 C) (02/17 0700) Temp Source: Oral (02/17 0700) BP: 165/66 (02/17 0700) Pulse Rate: 68 (02/17 0700)  Labs:  Recent Labs  03/08/16 1155 03/08/16 1821 03/09/16 0609  HGB  --  12.6* 13.2  HCT  --  37.2* 39.4  PLT  --  114* 118*  LABPROT  --  26.9* 24.7*  INR 2.8 2.44 2.19  CREATININE  --  1.40* 1.38*  TROPONINI  --  0.05*  --     Estimated Creatinine Clearance: 42.6 mL/min (by C-G formula based on SCr of 1.38 mg/dL (H)).   Medical History: Past Medical History:  Diagnosis Date  . Atrial fibrillation ([place])   . CAD (coronary artery disease) 1996   status post PCI of the RCA   . Congestive heart failure, unspecified   . COPD (chronic obstructive pulmonary disease) (HCC)    Pt on home O2 at night and PRN, unsure of date of diagnosis.  . Diabetes mellitus, type 2 ([place])   . DJD (degenerative joint disease)   . GERD (gastroesophageal reflux disease)   . Gout   . HTN (hypertension)   . 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 Nephrolithiasis   . Other primary cardiomyopathies   . Personal history of DVT (deep vein thrombosis)   . Prostatitis     Medications:  Prescriptions Prior to Admission  Medication Sig Dispense Refill Last Dose  . acetaminophen (TYLENOL) 500 MG tablet Take 500-1,000 mg by mouth every 6 (six) hours as needed for mild pain.   Past Month at Unknown time  . albuterol (ACCUNEB) 1.25 MG/3ML nebulizer solution Take 3 mLs by nebulization 4 (four) times daily.  2 03/08/2016 at Unknown time  . alfuzosin (UROXATRAL) 10 MG 24 hr tablet Take 10 mg by mouth daily with breakfast.    03/08/2016 at Unknown time  .  allopurinol (ZYLOPRIM) 100 MG tablet Take 100 mg by mouth daily.   03/08/2016 at Unknown time  . atorvastatin (LIPITOR) 10 MG tablet Take 1 tablet (10 mg total) by mouth at bedtime. 90 tablet 1 03/07/2016 at Unknown time  . carvedilol (COREG) 12.5 MG tablet Take 1 tablet (12.5 mg total) by mouth 2 (two) times daily with a meal. 60 tablet 6 03/08/2016 at 7-800a  . cetirizine (ZYRTEC) 10 MG tablet Take 10 mg by mouth daily.   03/08/2016 at Unknown time  . digoxin (LANOXIN) 0.125 MG tablet Take 0.5 tablets (0.0625 mg total) by mouth daily. 45 tablet 3 03/08/2016 at Unknown time  . fluticasone (FLONASE) 50 MCG/ACT nasal spray Place 2 sprays into both nostrils at bedtime as needed for allergies.    unknown  . furosemide (LASIX) 40 MG tablet Take 1 tablet (40 mg total) by mouth daily. 30 tablet 0 03/08/2016 at Unknown time  . losartan (COZAAR) 25 MG tablet Take 1 tablet (25 mg total) by mouth daily. 30 tablet 0 03/08/2016 at Unknown time  . mometasone-formoterol (DULERA) 100-5 MCG/ACT AERO Inhale 2 puffs into the lungs 2 (two) times daily. 1 Inhaler 2 03/08/2016 at Unknown time  . potassium chloride (K-DUR) 10 MEQ tablet Take 1 tablet (10 mEq total) by mouth 2 (two) times daily.  Past Week at Unknown time  . PROAIR HFA 108 (90 BASE) MCG/ACT inhaler INHALE 2 PUFFS EVERY 4 HOURS AS NEEDED FOR WHEEZING OR SHORTNESS OF BREATH. 8.5 g 1 03/08/2016 at Unknown time  . warfarin (COUMADIN) 1 MG tablet Take 2 tabs daily except 3 tabs on Wednesday & Saturday or as directed by [facility] (Patient taking differently: Take 2mg  by mouth on Tuesday, Thursday, Friday, Sunday. Take 3mg  by mouth on Monday, Wednesday & Saturday as directed by [facility])   03/07/2016 at 1700  . azithromycin (ZITHROMAX Z-PAK) 250 MG tablet Take 1 tablet (250 mg total) by mouth daily. 2 pills (500mg ) day 1 then 1 pill (250mg ) days 2-5. (Patient not taking: Reported on 03/08/2016) 6 tablet 0 Not Taking at Unknown time  . predniSONE (DELTASONE) 20 MG  tablet Take 2 tablets (40 mg total) by mouth daily. (Patient not taking: Reported on 03/08/2016) 10 tablet 0 Not Taking at Unknown time  . predniSONE (DELTASONE) 20 MG tablet Take 2 tablets (40 mg total) by mouth daily. (Patient not taking: Reported on 03/08/2016) 10 tablet 0 Not Taking at Unknown time  . predniSONE (STERAPRED UNI-PAK 21 TAB) 10 MG (21) TBPK tablet Take 10-60 mg by mouth See admin instructions. 6,5,4,3,2,1 starting on 03/08/2016      Infusions:    Assessment: Continuation of Warfarin PTA for AFIB INR therapeutic on admission INR therapeutic this AM No signs of bleeding  Goal of Therapy:  INR 2-3 Monitor platelets by anticoagulation protocol: Yes   Plan:  Coumadin 3 mg po x 1 dose today (home regiment) INR/PT daily  Abner Greenspan, Emmajo Bennette Bennett 03/09/2016,8:29 AM

## 2016-03-09 NOTE — Progress Notes (Addendum)
PROGRESS NOTE  Troy [name] XLK:[id number] DOB: [birth year] DOA: 03/08/2016 PCP: Barbette Merino, MD  HPI/Recap of past 24 hours:  Feeling better, though he reports still not at his baseline yet He is sitting up in chair, no room air at rest, denies chest pain   Assessment/Plan: Active Problems:   Chronic atrial fibrillation (HCC)   Chronic kidney disease, stage 3   Chronic obstructive pulmonary disease with acute exacerbation (HCC)   Acute exacerbation of CHF (congestive heart failure) ([place])   Acute on chronic respiratory failure with hypoxia and hypercapnia ([place])   Acute on chronic systolic chf exacerbation: Last echo from 2015 showed EF25%, s/p bivICD Repeat echo pending, he does have significant elevated bnp on presentation. No significant lower extremity edema  on presentation. cxr on presentation: "New interstitial prominence in both lung bases suspicious for edema with small bilateral pleural effusions, consistent with mild congestive heart failure." Improving with iv lasix, though not back to baseline yet, will continue iv lasix, daily weight, strict intake and output.   COPD exacerbation: per EDP and admitting MD, there was wheezing on exam , he is started on steroids  No wheezing on today's exam, taper steroids, continue nebs  Hypokalemia: k 2.8 on admission, replacek, check mag  PAF, currently v paced rhythm, continue coreg/dig, coumadin per pharmacy.  HTN; continue coreg/cozaar   CKD III, stable at baseline  Code Status: full  Family Communication: patient   Disposition Plan: home in 24-48hrs if he continue to improve and return to baseline, currently not back to baseline yet   Consultants:  none  Procedures:  none  Antibiotics:  none   Objective: BP (!) 165/66 (BP Location: Left Arm)   Pulse 68   Temp 97.7 F (36.5 C) (Oral)   Resp 15   Ht 5\' 7"  (1.702 m)   Wt 77.4 kg (170 lb 10.2 oz)   SpO2 95%   BMI 26.73 kg/m   Intake/Output  Summary (Last 24 hours) at 03/09/16 1328 Last data filed at 03/09/16 0708  Gross per 24 hour  Intake                0 ml  Output             2275 ml  Net            -2275 ml   Filed Weights   03/08/16 1511 03/08/16 2246 03/09/16 0700  Weight: 72.6 kg (160 lb) 78.2 kg (172 lb 6.4 oz) 77.4 kg (170 lb 10.2 oz)    Exam:   General:  NAD  Cardiovascular: paced rhythm  Respiratory: over all diminished, but no wheezing, no rales, no rhonchi  Abdomen: Soft/ND/NT, positive BS  Musculoskeletal: No Edema  Neuro: aaox3  Data Reviewed: Basic Metabolic Panel:  Recent Labs Lab 03/08/16 1821 03/09/16 0609  NA 139 142  K 2.8* 3.7  CL 100* 103  CO2 32 30  GLUCOSE 101* 168*  BUN 16 19  CREATININE 1.40* 1.38*  CALCIUM 8.9 9.0   Liver Function Tests: No results for input(s): AST, ALT, ALKPHOS, BILITOT, PROT, ALBUMIN in the last 168 hours. No results for input(s): LIPASE, AMYLASE in the last 168 hours. No results for input(s): AMMONIA in the last 168 hours. CBC:  Recent Labs Lab 03/08/16 1821 03/09/16 0609  WBC 6.9 5.7  NEUTROABS 4.3  --   HGB 12.6* 13.2  HCT 37.2* 39.4  MCV 98.7 98.5  PLT 114* 118*   Cardiac Enzymes:    Recent  Labs Lab 03/08/16 1821  TROPONINI 0.05*   BNP (last 3 results)  Recent Labs  12/15/15 1917 03/08/16 1821  BNP 244.0* 1,075.0*    ProBNP (last 3 results) No results for input(s): PROBNP in the last 8760 hours.  CBG: No results for input(s): GLUCAP in the last 168 hours.  No results found for this or any previous visit (from the past 240 hour(s)).   Studies: Dg Chest 2 View  Result Date: 03/08/2016 CLINICAL DATA:  Shortness breath with wheezing and nonproductive cough for 1 week. History of congestive heart failure and diabetes. EXAM: CHEST  2 VIEW COMPARISON:  12/15/2015 and 12/06/2015. FINDINGS: The heart size and mediastinal contours are stable. Left subclavian AICD leads are unchanged within the right atrium, right ventricle  and coronary sinus. There is interval increased interstitial prominence at both lung bases suspicious for mild edema. There are small bilateral pleural effusions. No confluent airspace opacity or pneumothorax identified. There are stable degenerative changes throughout the spine. IMPRESSION: New interstitial prominence in both lung bases suspicious for edema with small bilateral pleural effusions, consistent with mild congestive heart failure. Electronically Signed   By: Richardean Sale M.D.   On: 03/08/2016 19:57    Scheduled Meds: . albuterol  2.5 mg Nebulization Q6H  . alfuzosin  10 mg Oral Q breakfast  . allopurinol  100 mg Oral Daily  . atorvastatin  10 mg Oral QHS  . carvedilol  12.5 mg Oral BID WC  . digoxin  0.0625 mg Oral Daily  . furosemide  40 mg Intravenous BID  . guaiFENesin  600 mg Oral BID  . loratadine  10 mg Oral Daily  . losartan  25 mg Oral Daily  . methylPREDNISolone (SOLU-MEDROL) injection  60 mg Intravenous Q12H  . mometasone-formoterol  2 puff Inhalation BID  . potassium chloride  40 mEq Oral TID  . tiotropium  18 mcg Inhalation Daily  . warfarin  3 mg Oral Once  . Warfarin - Pharmacist Dosing Inpatient   Does not apply Q24H    Continuous Infusions:   Time spent: 33mins  Kynzi Levay MD, PhD  [facility] Pager [phone]. If 7PM-7AM, please contact night-coverage at [email or web address], password [facility] 03/09/2016, 1:28 PM  LOS: 1 day

## 2016-03-09 NOTE — Progress Notes (Signed)
*  PRELIMINARY RESULTS* Echocardiogram 2D Echocardiogram has been performed.  Samuel Germany 03/09/2016, 3:00 PM

## 2016-03-10 DIAGNOSIS — J9621 Acute and chronic respiratory failure with hypoxia: Secondary | ICD-10-CM

## 2016-03-10 DIAGNOSIS — N183 Chronic kidney disease, stage 3 (moderate): Secondary | ICD-10-CM

## 2016-03-10 DIAGNOSIS — I5023 Acute on chronic systolic (congestive) heart failure: Secondary | ICD-10-CM

## 2016-03-10 DIAGNOSIS — J9622 Acute and chronic respiratory failure with hypercapnia: Secondary | ICD-10-CM

## 2016-03-10 DIAGNOSIS — J441 Chronic obstructive pulmonary disease with (acute) exacerbation: Secondary | ICD-10-CM

## 2016-03-10 DIAGNOSIS — I482 Chronic atrial fibrillation: Secondary | ICD-10-CM

## 2016-03-10 LAB — CBC
HEMATOCRIT: 37.2 % — AB (ref 39.0–52.0)
HEMOGLOBIN: 12.7 g/dL — AB (ref 13.0–17.0)
MCH: 33.5 pg (ref 26.0–34.0)
MCHC: 34.1 g/dL (ref 30.0–36.0)
MCV: 98.2 fL (ref 78.0–100.0)
Platelets: 120 10*3/uL — ABNORMAL LOW (ref 150–400)
RBC: 3.79 MIL/uL — ABNORMAL LOW (ref 4.22–5.81)
RDW: 15.2 % (ref 11.5–15.5)
WBC: 17.3 10*3/uL — ABNORMAL HIGH (ref 4.0–10.5)

## 2016-03-10 LAB — BASIC METABOLIC PANEL
Anion gap: 6 (ref 5–15)
BUN: 28 mg/dL — AB (ref 6–20)
CALCIUM: 8.8 mg/dL — AB (ref 8.9–10.3)
CO2: 34 mmol/L — AB (ref 22–32)
CREATININE: 1.45 mg/dL — AB (ref 0.61–1.24)
Chloride: 100 mmol/L — ABNORMAL LOW (ref 101–111)
GFR calc non Af Amer: 45 mL/min — ABNORMAL LOW (ref >60)
GFR, EST AFRICAN AMERICAN: 52 mL/min — AB (ref >60)
Glucose, Bld: 149 mg/dL — ABNORMAL HIGH (ref 65–99)
Potassium: 3.7 mmol/L (ref 3.5–5.1)
Sodium: 140 mmol/L (ref 135–145)

## 2016-03-10 LAB — PROTIME-INR
INR: 2.97
Prothrombin Time: 31.5 seconds — ABNORMAL HIGH (ref 11.4–15.2)

## 2016-03-10 LAB — BRAIN NATRIURETIC PEPTIDE: B NATRIURETIC PEPTIDE 5: 620 pg/mL — AB (ref 0.0–100.0)

## 2016-03-10 LAB — MAGNESIUM: Magnesium: 1.7 mg/dL (ref 1.7–2.4)

## 2016-03-10 MED ORDER — TIOTROPIUM BROMIDE MONOHYDRATE 18 MCG IN CAPS: 18.0000 ug | ORAL_CAPSULE | Freq: Every day | RESPIRATORY_TRACT | 12 refills | Status: DC

## 2016-03-10 MED ORDER — GUAIFENESIN ER 600 MG PO TB12: 600.0000 mg | ORAL_TABLET | Freq: Two times a day (BID) | ORAL | 0 refills | Status: DC

## 2016-03-10 MED ORDER — WARFARIN SODIUM 1 MG PO TABS: 1.0000 mg | ORAL_TABLET | Freq: Once | ORAL | Status: DC

## 2016-03-10 MED ORDER — FUROSEMIDE 40 MG PO TABS: 60.0000 mg | ORAL_TABLET | Freq: Every day | ORAL | 0 refills | Status: DC

## 2016-03-10 MED ORDER — PREDNISONE 10 MG PO TABS: ORAL_TABLET | ORAL | 0 refills | Status: DC

## 2016-03-10 NOTE — Progress Notes (Signed)
ANTICOAGULATION CONSULT NOTE -   Pharmacy Consult for Warfarin Indication: atrial fibrillation  Allergies  Allergen Reactions  . Benadryl [Diphenhydramine Hcl] Nausea And Vomiting    Patient Measurements: Height: 5\' 7"  (170.2 cm) Weight: 172 lb 1.6 oz (78.1 kg) IBW/kg (Calculated) : 66.1 Heparin Dosing Weight:   Vital Signs: Temp: 98.2 F (36.8 C) (02/18 0650) Temp Source: Oral (02/18 0650) BP: 140/67 (02/18 0650) Pulse Rate: 63 (02/18 0650)  Labs:  Recent Labs  03/08/16 1821 03/09/16 0609 03/10/16 0603  HGB 12.6* 13.2 12.7*  HCT 37.2* 39.4 37.2*  PLT 114* 118* 120*  LABPROT 26.9* 24.7* 31.5*  INR 2.44 2.19 2.97  CREATININE 1.40* 1.38* 1.45*  TROPONINI 0.05*  --   --     Estimated Creatinine Clearance: 40.5 mL/min (by C-G formula based on SCr of 1.45 mg/dL (H)).   Medical History: Past Medical History:  Diagnosis Date  . Atrial fibrillation ([place])   . CAD (coronary artery disease) 1996   status post PCI of the RCA   . Congestive heart failure, unspecified   . COPD (chronic obstructive pulmonary disease) (HCC)    Pt on home O2 at night and PRN, unsure of date of diagnosis.  . Diabetes mellitus, type 2 ([place])   . DJD (degenerative joint disease)   . GERD (gastroesophageal reflux disease)   . Gout   . HTN (hypertension)   . 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 Nephrolithiasis   . Other primary cardiomyopathies   . Personal history of DVT (deep vein thrombosis)   . Prostatitis     Medications:  Prescriptions Prior to Admission  Medication Sig Dispense Refill Last Dose  . acetaminophen (TYLENOL) 500 MG tablet Take 500-1,000 mg by mouth every 6 (six) hours as needed for mild pain.   Past Month at Unknown time  . albuterol (ACCUNEB) 1.25 MG/3ML nebulizer solution Take 3 mLs by nebulization 4 (four) times daily.  2 03/08/2016 at Unknown time  . alfuzosin (UROXATRAL) 10 MG 24 hr tablet Take 10 mg by mouth daily with breakfast.    03/08/2016 at Unknown time   . allopurinol (ZYLOPRIM) 100 MG tablet Take 100 mg by mouth daily.   03/08/2016 at Unknown time  . atorvastatin (LIPITOR) 10 MG tablet Take 1 tablet (10 mg total) by mouth at bedtime. 90 tablet 1 03/07/2016 at Unknown time  . carvedilol (COREG) 12.5 MG tablet Take 1 tablet (12.5 mg total) by mouth 2 (two) times daily with a meal. 60 tablet 6 03/08/2016 at 7-800a  . cetirizine (ZYRTEC) 10 MG tablet Take 10 mg by mouth daily.   03/08/2016 at Unknown time  . digoxin (LANOXIN) 0.125 MG tablet Take 0.5 tablets (0.0625 mg total) by mouth daily. 45 tablet 3 03/08/2016 at Unknown time  . fluticasone (FLONASE) 50 MCG/ACT nasal spray Place 2 sprays into both nostrils at bedtime as needed for allergies.    unknown  . furosemide (LASIX) 40 MG tablet Take 1 tablet (40 mg total) by mouth daily. 30 tablet 0 03/08/2016 at Unknown time  . losartan (COZAAR) 25 MG tablet Take 1 tablet (25 mg total) by mouth daily. 30 tablet 0 03/08/2016 at Unknown time  . mometasone-formoterol (DULERA) 100-5 MCG/ACT AERO Inhale 2 puffs into the lungs 2 (two) times daily. 1 Inhaler 2 03/08/2016 at Unknown time  . potassium chloride (K-DUR) 10 MEQ tablet Take 1 tablet (10 mEq total) by mouth 2 (two) times daily.   Past Week at Unknown time  . PROAIR HFA 108 (  90 BASE) MCG/ACT inhaler INHALE 2 PUFFS EVERY 4 HOURS AS NEEDED FOR WHEEZING OR SHORTNESS OF BREATH. 8.5 g 1 03/08/2016 at Unknown time  . warfarin (COUMADIN) 1 MG tablet Take 2 tabs daily except 3 tabs on Wednesday & Saturday or as directed by [facility] (Patient taking differently: Take 2mg  by mouth on Tuesday, Thursday, Friday, Sunday. Take 3mg  by mouth on Monday, Wednesday & Saturday as directed by [facility])   03/07/2016 at 1700  . azithromycin (ZITHROMAX Z-PAK) 250 MG tablet Take 1 tablet (250 mg total) by mouth daily. 2 pills (500mg ) day 1 then 1 pill (250mg ) days 2-5. (Patient not taking: Reported on 03/08/2016) 6 tablet 0 Not Taking at Unknown time  . predniSONE (DELTASONE)  20 MG tablet Take 2 tablets (40 mg total) by mouth daily. (Patient not taking: Reported on 03/08/2016) 10 tablet 0 Not Taking at Unknown time  . predniSONE (DELTASONE) 20 MG tablet Take 2 tablets (40 mg total) by mouth daily. (Patient not taking: Reported on 03/08/2016) 10 tablet 0 Not Taking at Unknown time  . predniSONE (STERAPRED UNI-PAK 21 TAB) 10 MG (21) TBPK tablet Take 10-60 mg by mouth See admin instructions. 6,5,4,3,2,1 starting on 03/08/2016      Infusions:    Assessment: Continuation of Warfarin PTA for AFIB INR therapeutic on admission INR therapeutic this AM, however at upper range of therapeutic No signs of bleeding  Goal of Therapy:  INR 2-3 Monitor platelets by anticoagulation protocol: Yes   Plan:  Coumadin1 mg po x 1 dose today (lower dose to slow INR rise and keep therapeutic) INR/PT daily  Abner Greenspan, Zorana Brockwell Bennett 03/10/2016,11:21 AM

## 2016-03-10 NOTE — Discharge Summary (Signed)
Physician Discharge Summary  Troy [name] KYH:[id number] DOB: 31-Mar-1940 DOA: 03/08/2016  PCP: Barbette Merino, MD  Admit date: 03/08/2016 Discharge date: 03/10/2016  Admitted From: home Disposition:  home  Recommendations for Outpatient Follow-up:  1. Follow up with PCP in 1-2 weeks 2. Please obtain BMP/CBC in one week 3. Follow-up with cardiology in the next 1-2 weeks  Home Health: Home health RN Equipment/Devices:  Discharge Condition:stable CODE STATUS: full Diet recommendation: Heart Healthy   Brief/Interim Summary: 76 year old male with a history of COPD, systolic heart failure with ejection fraction of 20-25%, diabetes, atrial fibrillation, presented with shortness of breath and cough for 1 week prior to admission. He was found to have decompensated congestive heart failure as well as COPD exacerbation. Patient was admitted for further management.  He was started on intravenous Lasix with good urine output. Renal function remained relatively stable, but has trended up overnight. Volume status has significantly improved. He is no longer has lower extremity edema and shortness of breath has resolved. Repeat echocardiogram shows improvement of EF to 30-35%. He will need to follow-up with his cardiologist. He is continued on beta blocker and ARB. We'll increase Lasix from 40 mg daily to 60 mg daily. Repeat bmet in 1 week.  For COPD, he was treated with bronchodilators and intravenous steroids. He is in place on a prednisone taper. He is no longer having any wheezing. Respiratory status appears to be a baseline.  Remainder of his medical issues remained stable.  Discharge Diagnoses:  Active Problems:   Chronic atrial fibrillation (HCC)   Chronic kidney disease, stage 3   Chronic obstructive pulmonary disease with acute exacerbation (HCC)   Acute exacerbation of CHF (congestive heart failure) (HCC)   Acute on chronic respiratory failure with hypoxia and hypercapnia  [facility])    Discharge Instructions  Discharge Instructions    Diet - low sodium heart healthy    Complete by:  As directed    Increase activity slowly    Complete by:  As directed      Allergies as of 03/10/2016      Reactions   Benadryl [diphenhydramine Hcl] Nausea And Vomiting      Medication List    STOP taking these medications   azithromycin 250 MG tablet Commonly known as:  ZITHROMAX Z-PAK     TAKE these medications   acetaminophen 500 MG tablet Commonly known as:  TYLENOL Take 500-1,000 mg by mouth every 6 (six) hours as needed for mild pain.   alfuzosin 10 MG 24 hr tablet Commonly known as:  UROXATRAL Take 10 mg by mouth daily with breakfast.   allopurinol 100 MG tablet Commonly known as:  ZYLOPRIM Take 100 mg by mouth daily.   atorvastatin 10 MG tablet Commonly known as:  LIPITOR Take 1 tablet (10 mg total) by mouth at bedtime.   carvedilol 12.5 MG tablet Commonly known as:  COREG Take 1 tablet (12.5 mg total) by mouth 2 (two) times daily with a meal.   cetirizine 10 MG tablet Commonly known as:  ZYRTEC Take 10 mg by mouth daily.   digoxin 0.125 MG tablet Commonly known as:  LANOXIN Take 0.5 tablets (0.0625 mg total) by mouth daily.   fluticasone 50 MCG/ACT nasal spray Commonly known as:  FLONASE Place 2 sprays into both nostrils at bedtime as needed for allergies.   furosemide 40 MG tablet Commonly known as:  LASIX Take 1.5 tablets (60 mg total) by mouth daily. What changed:  how much to take  guaiFENesin 600 MG 12 hr tablet Commonly known as:  MUCINEX Take 1 tablet (600 mg total) by mouth 2 (two) times daily.   losartan 25 MG tablet Commonly known as:  COZAAR Take 1 tablet (25 mg total) by mouth daily.   mometasone-formoterol 100-5 MCG/ACT Aero Commonly known as:  DULERA Inhale 2 puffs into the lungs 2 (two) times daily.   potassium chloride 10 MEQ tablet Commonly known as:  K-DUR Take 1 tablet (10 mEq total) by mouth 2 (two) times  daily.   predniSONE 10 MG tablet Commonly known as:  DELTASONE Take 40mg  po daily for 1 day then 30mg  daily for 1 day then 20mg  daily for 1 day then 10mg  daily for 1 day What changed:  medication strength  how much to take  how to take this  when to take this  additional instructions  Another medication with the same name was removed. Continue taking this medication, and follow the directions you see here.   PROAIR HFA 108 (90 Base) MCG/ACT inhaler Generic drug:  albuterol INHALE 2 PUFFS EVERY 4 HOURS AS NEEDED FOR WHEEZING OR SHORTNESS OF BREATH.   albuterol 1.25 MG/3ML nebulizer solution Commonly known as:  ACCUNEB Take 3 mLs by nebulization 4 (four) times daily.   tiotropium 18 MCG inhalation capsule Commonly known as:  SPIRIVA Place 1 capsule (18 mcg total) into inhaler and inhale daily. Start taking on:  03/11/2016   warfarin 1 MG tablet Commonly known as:  COUMADIN Take 2 tabs daily except 3 tabs on Wednesday & Saturday or as directed by Coumadin Clinic What changed:  additional instructions       Allergies  Allergen Reactions  . Benadryl [Diphenhydramine Hcl] Nausea And Vomiting    Consultations:     Procedures/Studies: Dg Chest 2 View  Result Date: 03/08/2016 CLINICAL DATA:  Shortness breath with wheezing and nonproductive cough for 1 week. History of congestive heart failure and diabetes. EXAM: CHEST  2 VIEW COMPARISON:  12/15/2015 and 12/06/2015. FINDINGS: The heart size and mediastinal contours are stable. Left subclavian AICD leads are unchanged within the right atrium, right ventricle and coronary sinus. There is interval increased interstitial prominence at both lung bases suspicious for mild edema. There are small bilateral pleural effusions. No confluent airspace opacity or pneumothorax identified. There are stable degenerative changes throughout the spine. IMPRESSION: New interstitial prominence in both lung bases suspicious for edema with small  bilateral pleural effusions, consistent with mild congestive heart failure. Electronically Signed   By: Richardean Sale M.D.   On: 03/08/2016 19:57    Echo:- Left ventricle: The cavity size was moderately dilated. Wall   thickness was increased in a pattern of mild LVH. Systolic   function was moderately to severely reduced. The estimated   ejection fraction was in the range of 30% to 35%. Doppler   parameters are consistent with abnormal left ventricular   relaxation (grade 1 diastolic dysfunction). Doppler parameters   are consistent with high ventricular filling pressure. Basal   inferolateral severely hypokinetic. Diffusely hypokinetic. - Aortic valve: Moderately calcified annulus. Trileaflet. There was   moderate regurgitation. - Mitral valve: Calcified annulus. There was mild regurgitation. - Left atrium: The atrium was mildly to moderately dilated. - Right ventricle: Pacer wire or catheter noted in right ventricle. - Right atrium: Pacer wire or catheter noted in right atrium. - Tricuspid valve: There was mild regurgitation. - Pulmonary arteries: PA peak pressure: 58 mm Hg (S). - Systemic veins: IVC is dilated with normal  respiratory variation.   Estimated CVP 8 mmHg. - Pericardium, extracardiac: Small pericardial effusion, more   moderate in size posteriorly. No evidence for tamponade   physiology.   Subjective: No shortness of breath or chest pain  Discharge Exam: Vitals:   03/09/16 0700 03/10/16 0650  BP: (!) 165/66 140/67  Pulse: 68 63  Resp: 15 20  Temp: 97.7 F (36.5 C) 98.2 F (36.8 C)   Vitals:   03/10/16 0650 03/10/16 0725 03/10/16 0731 03/10/16 0733  BP: 140/67     Pulse: 63     Resp: 20     Temp: 98.2 F (36.8 C)     TempSrc: Oral     SpO2: 95% 94% 94% 94%  Weight: 78.1 kg (172 lb 1.6 oz)     Height:        General: Pt is alert, awake, not in acute distress Cardiovascular: RRR, S1/S2 +, no rubs, no gallops Respiratory: CTA bilaterally, no  wheezing, no rhonchi Abdominal: Soft, NT, ND, bowel sounds + Extremities: no edema, no cyanosis    The results of significant diagnostics from this hospitalization (including imaging, microbiology, ancillary and laboratory) are listed below for reference.     Microbiology: No results found for this or any previous visit (from the past 240 hour(s)).   Labs: BNP (last 3 results)  Recent Labs  12/15/15 1917 03/08/16 1821 03/10/16 0604  BNP 244.0* 1,075.0* 573.2*   Basic Metabolic Panel:  Recent Labs Lab 03/08/16 1821 03/09/16 0609 03/10/16 0603  NA 139 142 140  K 2.8* 3.7 3.7  CL 100* 103 100*  CO2 32 30 34*  GLUCOSE 101* 168* 149*  BUN 16 19 28*  CREATININE 1.40* 1.38* 1.45*  CALCIUM 8.9 9.0 8.8*  MG  --   --  1.7   Liver Function Tests: No results for input(s): AST, ALT, ALKPHOS, BILITOT, PROT, ALBUMIN in the last 168 hours. No results for input(s): LIPASE, AMYLASE in the last 168 hours. No results for input(s): AMMONIA in the last 168 hours. CBC:  Recent Labs Lab 03/08/16 1821 03/09/16 0609 03/10/16 0603  WBC 6.9 5.7 17.3*  NEUTROABS 4.3  --   --   HGB 12.6* 13.2 12.7*  HCT 37.2* 39.4 37.2*  MCV 98.7 98.5 98.2  PLT 114* 118* 120*   Cardiac Enzymes:  Recent Labs Lab 03/08/16 1821  TROPONINI 0.05*   BNP: Invalid input(s): POCBNP CBG: No results for input(s): GLUCAP in the last 168 hours. D-Dimer No results for input(s): DDIMER in the last 72 hours. Hgb A1c No results for input(s): HGBA1C in the last 72 hours. Lipid Profile No results for input(s): CHOL, HDL, LDLCALC, TRIG, CHOLHDL, LDLDIRECT in the last 72 hours. Thyroid function studies No results for input(s): TSH, T4TOTAL, T3FREE, THYROIDAB in the last 72 hours.  Invalid input(s): FREET3 Anemia work up No results for input(s): VITAMINB12, FOLATE, FERRITIN, TIBC, IRON, RETICCTPCT in the last 72 hours. Urinalysis    Component Value Date/Time   COLORURINE YELLOW 12/15/2015 2154    APPEARANCEUR CLEAR 12/15/2015 2154   LABSPEC 1.009 12/15/2015 2154   PHURINE 5.0 12/15/2015 2154   GLUCOSEU NEGATIVE 12/15/2015 2154   HGBUR NEGATIVE 12/15/2015 2154   BILIRUBINUR NEGATIVE 12/15/2015 2154   KETONESUR NEGATIVE 12/15/2015 2154   PROTEINUR NEGATIVE 12/15/2015 2154   UROBILINOGEN 0.2 11/21/2013 1946   NITRITE NEGATIVE 12/15/2015 2154   LEUKOCYTESUR NEGATIVE 12/15/2015 2154   Sepsis Labs Invalid input(s): PROCALCITONIN,  WBC,  LACTICIDVEN Microbiology No results found for this or any  previous visit (from the past 240 hour(s)).   Time coordinating discharge: Over 30 minutes  SIGNED:   Kathie Dike, MD  [facility] 03/10/2016, 11:59 AM Pager   If 7PM-7AM, please contact night-coverage [email or web address] Password [email or web address]

## 2016-03-11 ENCOUNTER — Telehealth: Payer: Self-pay | Admitting: Interventional Cardiology

## 2016-03-11 MED ORDER — POTASSIUM CHLORIDE ER 10 MEQ PO TBCR: 10.0000 meq | EXTENDED_RELEASE_TABLET | Freq: Two times a day (BID) | ORAL | 3 refills | Status: DC

## 2016-03-11 NOTE — Telephone Encounter (Addendum)
The pt called stating that someone from [place] (where he went to the ER on 2/16) called him today and advised him that his Potassium level is low and gave him instruction on how to take his medications.  I checked his labs from 2/18 and his potassium is normal at 3.7.  He then said well "I dont know what the hell they are talking about then". I advised him to call [facility] back to ask which lab they are talking about and he said "I dont need to call them back cause Im getting ready to get rid of all of y'all".   I asked if the person that called him from [facility] gave him any instructions when they called and he said yes they told me to take a pill and a half of his medicine. I advised him to do as recommended.  He then stated "you need to call them and find out what the hell is going on with me". I asked him to stop cussing but he continued to talk and cuss over me so I hung up.  When the call was ended I called [facility] and spoke with Katharine Look, [place] Director. Katharine Look and I both looked through the pts records from his hospital stay from 2/16-2/18 and saw that his Potassium level was low at 2.8 on 2/16 when he was admitted into [facility]. He was treated with additional Potassium and his level was at 3.7 on 2/18 at his discharge.  I called the pt back and explained what we found and he stated "oh yeah, I get so confused"  And he apologized for getting so upset during our first conversation. I advised him that I understand.  I advised the pt to be sure to take his Potassium 10 meq twice daily and not to miss any doses if possible. He is advised that I have called in his Potassium RX to [facility] in [place].   He verbalized understanding and thanked me for my assistance and again apologized and I again told him I understand.

## 2016-03-11 NOTE — Telephone Encounter (Signed)
New Message    Pt states that he went to [facility] in [place] and they stated that his potassium is very low and he would like to know what he should do about it. He does not think he needs a refill of his medicications he just needs advice.

## 2016-03-12 ENCOUNTER — Ambulatory Visit (INDEPENDENT_AMBULATORY_CARE_PROVIDER_SITE_OTHER): Payer: Medicare Other | Admitting: *Deleted

## 2016-03-12 ENCOUNTER — Ambulatory Visit (INDEPENDENT_AMBULATORY_CARE_PROVIDER_SITE_OTHER): Payer: Medicare Other | Admitting: Emergency Medicine

## 2016-03-12 ENCOUNTER — Encounter: Payer: Self-pay | Admitting: Emergency Medicine

## 2016-03-12 DIAGNOSIS — I480 Paroxysmal atrial fibrillation: Secondary | ICD-10-CM

## 2016-03-12 DIAGNOSIS — I482 Chronic atrial fibrillation, unspecified: Secondary | ICD-10-CM

## 2016-03-12 DIAGNOSIS — J449 Chronic obstructive pulmonary disease, unspecified: Secondary | ICD-10-CM | POA: Diagnosis not present

## 2016-03-12 DIAGNOSIS — I82409 Acute embolism and thrombosis of unspecified deep veins of unspecified lower extremity: Secondary | ICD-10-CM | POA: Diagnosis not present

## 2016-03-12 DIAGNOSIS — Z5181 Encounter for therapeutic drug level monitoring: Secondary | ICD-10-CM | POA: Diagnosis not present

## 2016-03-12 LAB — POCT INR: INR: 2.9

## 2016-03-12 MED ORDER — AEROCHAMBER MV MISC: 0 refills | Status: DC

## 2016-03-12 NOTE — Progress Notes (Signed)
Subjective:    Patient ID: Troy [name], male    DOB: 09-30-1940, 76 y.o.   MRN: [id number]  HPI 76 yo former smoker (100 pk-yrs), hx CAD and dilated CM, A Fib, DM, DVT's, AICD device. Dx w COPD several years ago. Referred by Dr Deforest Hoyles for dyspnea, cough and congestion. For the last month he has thick white mucous that is very difficult to get up. His SOB is worse. He can only walk 25 feet. He occasionally wheezes. He went to ED last Sunday, received nebs. He is on loratadine, fluticasone. Takes Advair.   ROV 07/29/13 -- follows for COPD, MMP as above. We changed advair to Anoro last visit > he feels much better, breathing is much improved. His cough has almost fully resolved. Also treated with pred taper + mucinex.  He is also on loratadine and fluticasone nasal spray.  We did not start Oxygen last time - I wanted to see if he would improve on BDs.   PFT > very severe AFL with borderline BD response. Decreased DLCO, normal volumes.   ROV 12/23/13 -- follow up visit for multifactorial dyspnea, severe COPD. He has been managed on Anoro.  Takes it once a day in the evening. He has not needed any albuterol . He has some occasional cough, non-productive. He doesn't have any wheezing. He was admitted 11/15 for pulmonary edema and hypoxemia.  He had a CT scan chest that showed GGI and a new 23mm RUL nodule that will need to be followed.   ROV 03/12/16 -- This follow-up visit to reestablish care. The patient has a history of very severe COPD. I reviewed his pulmonary function testing from 07/29/13. These show hyperinflation with an FEV1 of 1.11 L (44% predicted), decreased diffusion capacity.  He reports that his breathing has ups and downs. Has occasionally has dyspnea that is responsive to diuretics. He reports occasional wheeze, cough. He has baseline exertional dyspnea. Recent worsening with admission to [facility] at which time he was treated w diuresis and also with corticosteroids. He was wheezing at the  time per the d/c summary. He tells me that he is no longer on Anoro, is currently on Spiriva + Dulera. He cannot remember when this was changed. He believes that the Spiriva is new from the recent hospitalization.   Review of Systems  Constitutional: Negative for fever and unexpected weight change.  HENT: Negative for congestion, dental problem, ear pain, nosebleeds, postnasal drip, rhinorrhea, sinus pressure, sneezing, sore throat and trouble swallowing.   Eyes: Negative for redness and itching.  Respiratory: Positive for cough and shortness of breath. Negative for chest tightness and wheezing.        Congestion  Cardiovascular: Negative for palpitations and leg swelling.  Gastrointestinal: Negative for nausea and vomiting.  Genitourinary: Negative for dysuria.  Musculoskeletal: Negative for joint swelling.  Skin: Negative for rash.  Neurological: Negative for headaches.  Hematological: Does not bruise/bleed easily.  Psychiatric/Behavioral: Negative for dysphoric mood. The patient is not nervous/anxious.       Objective:   Physical Exam Vitals:   03/12/16 1545  BP: (!) 142/80  BP Location: Left Arm  Cuff Size: Normal  Pulse: 65  SpO2: 95%  Weight: 176 lb (79.8 kg)  Height: 5\' 6"  (1.676 m)  Gen: Pleasant, well-nourished, in no distress,  normal affect  ENT: No lesions,  mouth clear,  oropharynx clear, disconjugate gaze, no postnasal drip  Neck: No JVD, no TMG, no carotid bruits  Lungs: normal excursion, somewhat  hyperinflated, no wheeze or crackles.   Cardiovascular: RRR, heart sounds normal, no murmur or gallops, no peripheral edema  Musculoskeletal: No deformities, no cyanosis or clubbing  Neuro: alert, non focal  Skin: Warm, no lesions or rashes     Assessment & Plan:  COPD (chronic obstructive pulmonary disease) (HCC) Poor insight into his disease. He does have daily symptoms including exertional dyspnea. I'm sure that some of these are related to his obstructive  lung disease, to his CHF. Recent hospitalization with apparent exacerbation that responded to corticosteroids. He doesn't know when he was taken off Anoro, isn't sure how long he has been on [place]. He is pretty sure that Spiriva was just added after the recent hospitalization. We discussed O2 compliance - he doesn;t want to wear it  Please continue Spiriva once a day Please continue Dulera 2 puffs twice a day using a spacer. Rinse your mouth after taking.  Take ProAir 2 puffs up to every 4 hours if needed for shortness of breath.  Start using your oxygen 2L/min with exertions, walking etcetera.  Follow with Dr Lamonte Sakai in 3 months or sooner if you have any problems.  Baltazar Apo, MD, PhD 03/12/2016, 4:13 PM [facility] [phone] or if no answer [phone]

## 2016-03-12 NOTE — Assessment & Plan Note (Signed)
Poor insight into his disease. He does have daily symptoms including exertional dyspnea. I'm sure that some of these are related to his obstructive lung disease, to his CHF. Recent hospitalization with apparent exacerbation that responded to corticosteroids. He doesn't know when he was taken off Anoro, isn't sure how long he has been on [place]. He is pretty sure that Spiriva was just added after the recent hospitalization. We discussed O2 compliance - he doesn;t want to wear it  Please continue Spiriva once a day Please continue Dulera 2 puffs twice a day using a spacer. Rinse your mouth after taking.  Take ProAir 2 puffs up to every 4 hours if needed for shortness of breath.  Start using your oxygen 2L/min with exertions, walking etcetera.  Follow with Dr Lamonte Sakai in 3 months or sooner if you have any problems.

## 2016-03-12 NOTE — Patient Instructions (Addendum)
Please continue Spiriva once a day Please continue Dulera 2 puffs twice a day using a spacer. Rinse your mouth after taking.  Take ProAir 2 puffs up to every 4 hours if needed for shortness of breath.  Start using your oxygen 2L/min with exertions, walking etcetera.  Follow with Dr Lamonte Sakai in 3 months or sooner if you have any problems.

## 2016-04-18 ENCOUNTER — Ambulatory Visit (INDEPENDENT_AMBULATORY_CARE_PROVIDER_SITE_OTHER): Payer: Medicare Other | Admitting: *Deleted

## 2016-04-18 DIAGNOSIS — I482 Chronic atrial fibrillation, unspecified: Secondary | ICD-10-CM

## 2016-04-18 DIAGNOSIS — Z5181 Encounter for therapeutic drug level monitoring: Secondary | ICD-10-CM | POA: Diagnosis not present

## 2016-04-18 DIAGNOSIS — I480 Paroxysmal atrial fibrillation: Secondary | ICD-10-CM

## 2016-04-18 DIAGNOSIS — I82409 Acute embolism and thrombosis of unspecified deep veins of unspecified lower extremity: Secondary | ICD-10-CM | POA: Diagnosis not present

## 2016-04-18 LAB — POCT INR: INR: 3.3

## 2016-05-02 ENCOUNTER — Ambulatory Visit (INDEPENDENT_AMBULATORY_CARE_PROVIDER_SITE_OTHER): Payer: Medicare Other | Admitting: *Deleted

## 2016-05-02 ENCOUNTER — Telehealth: Payer: Self-pay | Admitting: Interventional Cardiology

## 2016-05-02 DIAGNOSIS — Z5181 Encounter for therapeutic drug level monitoring: Secondary | ICD-10-CM

## 2016-05-02 DIAGNOSIS — I482 Chronic atrial fibrillation, unspecified: Secondary | ICD-10-CM

## 2016-05-02 DIAGNOSIS — I480 Paroxysmal atrial fibrillation: Secondary | ICD-10-CM | POA: Diagnosis not present

## 2016-05-02 DIAGNOSIS — I82409 Acute embolism and thrombosis of unspecified deep veins of unspecified lower extremity: Secondary | ICD-10-CM | POA: Diagnosis not present

## 2016-05-02 LAB — POCT INR: INR: 2.3

## 2016-05-02 NOTE — Telephone Encounter (Signed)
Patient states that he noticed he was getting low on his losartan. He states that when he looked at the bottle he noticed that it said losartan 50 mg daily. The patient is suppose to be taking 25 mg daily. He has almost completed this 50 mg bottle and needs a refill on the medication. The patient states that he has not checked his BP recently, but he said that he was seen the other day and it was 175/80. Please advise if you would like for me to refill the losartan for 25 mg or 50 mg QD

## 2016-05-02 NOTE — Telephone Encounter (Signed)
New Message  Pt c/o medication issue:  1. Name of Medication: losartan  2. How are you currently taking this medication (dosage and times per day)? 25mg    3. Are you having a reaction (difficulty breathing--STAT)?   4. What is your medication issue? Per pt would like to explain to RN when call is returned. Please call back to discuss

## 2016-05-03 MED ORDER — LOSARTAN POTASSIUM 50 MG PO TABS: 50.0000 mg | ORAL_TABLET | Freq: Every day | ORAL | 3 refills | Status: DC

## 2016-05-03 NOTE — Telephone Encounter (Signed)
OK to refill 50 mg tabs since that is what he is taking now.

## 2016-05-03 NOTE — Telephone Encounter (Signed)
Patient made aware that since he has been taking losartan 50 mg daily that he should continue to take that. A new prescription for losartan 50 mg daily sent to patient's preferred pharmacy. Patient instructed to monitor BP with the new prescription. Patient verbalized understanding and is in agreement with this plan.

## 2016-05-14 ENCOUNTER — Emergency Department (HOSPITAL_COMMUNITY)
Admission: EM | Admit: 2016-05-14 | Discharge: 2016-05-14 | Disposition: A | Discharge status: Home / Self Care | Payer: Medicare Other | Attending: Emergency Medicine | Admitting: Emergency Medicine

## 2016-05-14 ENCOUNTER — Emergency Department (HOSPITAL_COMMUNITY): Payer: Medicare Other

## 2016-05-14 ENCOUNTER — Encounter (HOSPITAL_COMMUNITY): Payer: Self-pay | Admitting: *Deleted

## 2016-05-14 DIAGNOSIS — N183 Chronic kidney disease, stage 3 (moderate): Secondary | ICD-10-CM | POA: Insufficient documentation

## 2016-05-14 DIAGNOSIS — Z87891 Personal history of nicotine dependence: Secondary | ICD-10-CM | POA: Diagnosis not present

## 2016-05-14 DIAGNOSIS — I5022 Chronic systolic (congestive) heart failure: Secondary | ICD-10-CM | POA: Diagnosis not present

## 2016-05-14 DIAGNOSIS — I130 Hypertensive heart and chronic kidney disease with heart failure and stage 1 through stage 4 chronic kidney disease, or unspecified chronic kidney disease: Secondary | ICD-10-CM | POA: Diagnosis not present

## 2016-05-14 DIAGNOSIS — R600 Localized edema: Secondary | ICD-10-CM | POA: Insufficient documentation

## 2016-05-14 DIAGNOSIS — Z7901 Long term (current) use of anticoagulants: Secondary | ICD-10-CM | POA: Diagnosis not present

## 2016-05-14 DIAGNOSIS — M7989 Other specified soft tissue disorders: Secondary | ICD-10-CM | POA: Diagnosis present

## 2016-05-14 DIAGNOSIS — E1122 Type 2 diabetes mellitus with diabetic chronic kidney disease: Secondary | ICD-10-CM | POA: Insufficient documentation

## 2016-05-14 DIAGNOSIS — J441 Chronic obstructive pulmonary disease with (acute) exacerbation: Secondary | ICD-10-CM | POA: Diagnosis not present

## 2016-05-14 DIAGNOSIS — Z79899 Other long term (current) drug therapy: Secondary | ICD-10-CM | POA: Diagnosis not present

## 2016-05-14 DIAGNOSIS — R609 Edema, unspecified: Secondary | ICD-10-CM

## 2016-05-14 HISTORY — DX: Zoster without complications: B02.9

## 2016-05-14 LAB — CBC WITH DIFFERENTIAL/PLATELET
BASOS ABS: 0 10*3/uL (ref 0.0–0.1)
BASOS PCT: 0 %
Eosinophils Absolute: 0.2 10*3/uL (ref 0.0–0.7)
Eosinophils Relative: 2 %
HEMATOCRIT: 39.7 % (ref 39.0–52.0)
HEMOGLOBIN: 13.3 g/dL (ref 13.0–17.0)
Lymphocytes Relative: 22 %
Lymphs Abs: 1.5 10*3/uL (ref 0.7–4.0)
MCH: 33.6 pg (ref 26.0–34.0)
MCHC: 33.5 g/dL (ref 30.0–36.0)
MCV: 100.3 fL — ABNORMAL HIGH (ref 78.0–100.0)
MONOS PCT: 10 %
Monocytes Absolute: 0.7 10*3/uL (ref 0.1–1.0)
NEUTROS PCT: 65 %
Neutro Abs: 4.4 10*3/uL (ref 1.7–7.7)
Platelets: 127 10*3/uL — ABNORMAL LOW (ref 150–400)
RBC: 3.96 MIL/uL — AB (ref 4.22–5.81)
RDW: 15.9 % — ABNORMAL HIGH (ref 11.5–15.5)
WBC: 6.7 10*3/uL (ref 4.0–10.5)

## 2016-05-14 LAB — BASIC METABOLIC PANEL
ANION GAP: 8 (ref 5–15)
BUN: 17 mg/dL (ref 6–20)
CO2: 33 mmol/L — AB (ref 22–32)
Calcium: 9.2 mg/dL (ref 8.9–10.3)
Chloride: 99 mmol/L — ABNORMAL LOW (ref 101–111)
Creatinine, Ser: 1.44 mg/dL — ABNORMAL HIGH (ref 0.61–1.24)
GFR calc non Af Amer: 46 mL/min — ABNORMAL LOW (ref >60)
GFR, EST AFRICAN AMERICAN: 53 mL/min — AB (ref >60)
GLUCOSE: 104 mg/dL — AB (ref 65–99)
POTASSIUM: 3.2 mmol/L — AB (ref 3.5–5.1)
Sodium: 140 mmol/L (ref 135–145)

## 2016-05-14 LAB — BRAIN NATRIURETIC PEPTIDE: B Natriuretic Peptide: 1534 pg/mL — ABNORMAL HIGH (ref 0.0–100.0)

## 2016-05-14 MED ORDER — FUROSEMIDE 80 MG PO TABS: 80.0000 mg | ORAL_TABLET | Freq: Two times a day (BID) | ORAL | 0 refills | Status: DC

## 2016-05-14 NOTE — Discharge Instructions (Signed)
You were seen in the ED today with peripheral edema. We are increasing your lasix for the next 3 days but you will need to follow up with your PCP for repeat lab testing to make sure your kidney function remains normal.   Return to the ED with any worsening difficulty breathing, chest pain, or fever.

## 2016-05-14 NOTE — ED Notes (Signed)
ED Provider at bedside. 

## 2016-05-14 NOTE — ED Triage Notes (Signed)
Pt comes in for bilateral leg swelling starting a couple of weeks ago. Pt has increased in fluid pill last week. Pt states he has a small cough, no shortness of breath. Pt was treated for shingles 3 weeks ago and still has pain at this area (on his right side abdomen). Area is healing well and there are no signs of blisters.

## 2016-05-14 NOTE — ED Provider Notes (Signed)
Emergency Department Provider Note   I have reviewed the triage vital signs and the nursing notes.   HISTORY  Chief Complaint Leg Swelling   HPI Troy [name] is a 76 y.o. male with PMH of CAD, a-fib, COPD, DM, GERD, HTN, and gout presents to the ED with continued LE edema despite increased lasix at home over the last week. The patient is prescribed 60 mg of Lasix daily. He reports taking 40 mg twice a day over the last week and attempt to increase his diuresis and decrease his leg swelling. Patient states that he's had some mild difficulty breathing but states it's not worse than normal. He does not have any chest pain. No significant change with exertion. He has had lower extremity edema in the past and often increasing his Lasix improves it but this time it did not. No fevers or chills. No vomiting or diarrhea.   Past Medical History:  Diagnosis Date  . Atrial fibrillation ([place])   . CAD (coronary artery disease) 1996   status post PCI of the RCA   . Congestive heart failure, unspecified   . COPD (chronic obstructive pulmonary disease) (HCC)    Pt on home O2 at night and PRN, unsure of date of diagnosis.  . Diabetes mellitus, type 2 ([place])   . DJD (degenerative joint disease)   . GERD (gastroesophageal reflux disease)   . Gout   . HTN (hypertension)   . Ischemic dilated cardiomyopathy ([place])   . Nephrolithiasis   . Other primary cardiomyopathies   . Personal history of DVT (deep vein thrombosis)   . Prostatitis   . Shingles     Patient Active Problem List   Diagnosis Date Noted  . Acute on chronic respiratory failure with hypoxia and hypercapnia ([place]) 03/08/2016  . Lower GI bleed 09/11/2015  . Bright red blood per rectum 09/11/2015  . Community acquired pneumonia 08/24/2015  . Acute exacerbation of chronic obstructive pulmonary disease (COPD) ([place]) 03/05/2015  . Acute renal failure superimposed on stage 3 chronic kidney disease ([place]) 03/05/2015  . Renal failure  (ARF), acute on chronic (HCC)   . Stomach ache 01/18/2015  . AKI (acute kidney injury) ([place]) 12/26/2014  . Acute exacerbation of CHF (congestive heart failure) ([place]) 12/25/2014  . BPH (benign prostatic hyperplasia) 12/25/2014  . FTT (failure to thrive) in adult 12/25/2014  . Encounter for therapeutic drug monitoring 08/05/2014  . Pulmonary nodules 12/23/2013  . Hemoptysis   . Dyspnea   . Chronic kidney disease, stage 3   . Coronary artery disease involving native coronary artery of native heart without angina pectoris   . Paroxysmal atrial fibrillation (HCC)   . Frequent PVCs   . Hypoxia   . Acute on chronic respiratory failure with hypoxia ([place])   . Chronic obstructive pulmonary disease with acute exacerbation ([place])   . COPD (chronic obstructive pulmonary disease) ([place]) 06/16/2013  . Lipoma of neck 09/11/2011  . 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 Coronary artery disease 01/30/2011  . Chronic atrial fibrillation ([place]) 01/30/2011  . Chronic systolic CHF (congestive heart failure) ([place])   . Other primary cardiomyopathies   . Diverticulosis of colon with hemorrhage 08/28/2010    Past Surgical History:  Procedure Laterality Date  . BACK SURGERY    . CARDIAC DEFIBRILLATOR PLACEMENT    . CORONARY STENT PLACEMENT    . PACEMAKER INSERTION      Current Outpatient Rx  . Order #: [id number] Class: Historical Med  . Order #:  [id number] Class: Historical Med  . Order #: [id number] Class: Historical Med  . Order #: [id number] Class: Historical Med  . Order #: [id number] Class: Normal  . Order #: [id number] Class: Normal  . Order #: [id number] Class: Historical Med  . Order #: [id number] Class: Normal  . Order #: [id number] Class: Historical Med  . Order #: [id number] Class: Normal  . Order #: [id number] Class: Normal  . Order #: [id number] Class: Print  . Order #: [id number] Class: Normal  . Order #: [id number] Class: Print  . Order #: [id number] Class: Normal  . Order #:  [id number] Class: No Print  . Order #: [id number] Class: Print  . Order #: [id number] Class: No Print    Allergies Benadryl [diphenhydramine hcl]  Family History  Problem Relation Age of Onset  . Heart disease Father   . Cancer Father     LUNG  . Colon cancer Mother   . Stroke Paternal Uncle   . Heart attack Neg Hx   . Hyperlipidemia Neg Hx   . Hypertension Neg Hx     Social History Social History  Substance Use Topics  . Smoking status: Former Smoker    Packs/day: 4.00    Years: 50.00    Types: Cigarettes    Quit date: 01/22/1980  . Smokeless tobacco: Never Used  . Alcohol use No     Comment: denies    Review of Systems  Constitutional: No fever/chills Eyes: No visual changes. ENT: No sore throat. Cardiovascular: Denies chest pain. Respiratory: Denies shortness of breath. Gastrointestinal: No abdominal pain.  No nausea, no vomiting.  No diarrhea.  No constipation. Genitourinary: Negative for dysuria. Musculoskeletal: Negative for back pain. Positive LE edema.  Skin: Negative for rash. Neurological: Negative for headaches, focal weakness or numbness.  10-point ROS otherwise negative.  ____________________________________________   PHYSICAL EXAM:  VITAL SIGNS: ED Triage Vitals [05/14/16 1202]  Enc Vitals Group     BP (!) 150/65     Pulse Rate 63     Resp 18     Temp 98.5 F (36.9 C)     Temp Source Oral     SpO2 91 %     Weight 175 lb (79.4 kg)     Height 5\' 6"  (1.676 m)     Pain Score 5   Constitutional: Alert and oriented. Well appearing and in no acute distress. Eyes: Conjunctivae are normal.  Head: Atraumatic. Nose: No congestion/rhinnorhea. Mouth/Throat: Mucous membranes are moist.   Neck: No stridor.  Cardiovascular: Normal rate, regular rhythm. Good peripheral circulation. Grossly normal heart sounds.   Respiratory: Normal respiratory effort.  No retractions. Lungs CTAB. Gastrointestinal: Soft and nontender. No distention.  Musculoskeletal: No  lower extremity tenderness. Positive 1+ pitting edema. No gross deformities of extremities. Neurologic:  Normal speech and language. No gross focal neurologic deficits are appreciated.  Skin:  Skin is warm, dry and intact. No rash noted.   ____________________________________________   LABS (all labs ordered are listed, but only abnormal results are displayed)  Labs Reviewed  CBC WITH DIFFERENTIAL/PLATELET - Abnormal; Notable for the following:       Result Value   RBC 3.96 (*)    MCV 100.3 (*)    RDW 15.9 (*)    Platelets 127 (*)    All other components within normal limits  BASIC METABOLIC PANEL - Abnormal; Notable for the following:    Potassium 3.2 (*)    Chloride 99 (*)    CO2 33 (*)    Glucose, Bld 104 (*)    Creatinine, Ser 1.44 (*)  GFR calc non Af Amer 46 (*)    GFR calc Af Amer 53 (*)    All other components within normal limits  BRAIN NATRIURETIC PEPTIDE - Abnormal; Notable for the following:    B Natriuretic Peptide 1,534.0 (*)    All other components within normal limits   ____________________________________________  RADIOLOGY  Dg Chest 2 View  Result Date: 05/14/2016 CLINICAL DATA:  Cough this morning EXAM: CHEST  2 VIEW COMPARISON:  04/16/2016 FINDINGS: Cardiac shadow remains enlarged. A defibrillator is again seen and stable. Mild vascular prominence is again identified. Chronic blunting of costophrenic angles is seen. Mild scarring is noted in the bases bilaterally. IMPRESSION: Chronic changes in the bases. Mild vascular congestion stable from the prior exam. No acute abnormality is noted. Electronically Signed   By: Inez Catalina M.D.   On: 05/14/2016 12:23    ____________________________________________   PROCEDURES  Procedure(s) performed:   Procedures  None ____________________________________________   INITIAL IMPRESSION / ASSESSMENT AND PLAN / ED COURSE  Pertinent labs & imaging results that were available during my care of the patient  were reviewed by me and considered in my medical decision making (see chart for details).  Patient presents to the emergency department for evaluation of worsening lower extremity edema. He has 1+ pitting edema to the mid calf. No overlying cellulitis or venous stasis dermatitis. Lungs are clear to auscultation bilaterally. Chest x-ray is unchanged from prior. Patient has baseline kidney function. BNP is elevated slightly from baseline which correlates clinically. Plan for brief increased lasix for the next 3 days along with PCP follow up for repeat lab work.   At this time, I do not feel there is any life-threatening condition present. I have reviewed and discussed all results (EKG, imaging, lab, urine as appropriate), exam findings with patient. I have reviewed nursing notes and appropriate previous records.  I feel the patient is safe to be discharged home without further emergent workup. Discussed usual and customary return precautions. Patient and family (if present) verbalize understanding and are comfortable with this plan.  Patient will follow-up with their primary care provider. If they do not have a primary care provider, information for follow-up has been provided to them. All questions have been answered.  ____________________________________________  FINAL CLINICAL IMPRESSION(S) / ED DIAGNOSES  Final diagnoses:  Peripheral edema     MEDICATIONS GIVEN DURING THIS VISIT:  Medications - No data to display   NEW OUTPATIENT MEDICATIONS STARTED DURING THIS VISIT:  None   Note:  This document was prepared using Dragon voice recognition software and may include unintentional dictation errors.  Nanda Quinton, MD Emergency Medicine   Margette Fast, MD 05/14/16 Einar Crow

## 2016-05-27 ENCOUNTER — Ambulatory Visit (INDEPENDENT_AMBULATORY_CARE_PROVIDER_SITE_OTHER): Payer: Medicare Other | Admitting: *Deleted

## 2016-05-27 DIAGNOSIS — I5022 Chronic systolic (congestive) heart failure: Secondary | ICD-10-CM

## 2016-05-27 DIAGNOSIS — J441 Chronic obstructive pulmonary disease with (acute) exacerbation: Secondary | ICD-10-CM | POA: Diagnosis not present

## 2016-05-27 LAB — CUP PACEART INCLINIC DEVICE CHECK
HighPow Impedance: 40.9764
Implantable Lead Implant Date: 20120801
Implantable Lead Implant Date: 20120801
Implantable Lead Location: 753858
Implantable Lead Location: 753859
Implantable Pulse Generator Implant Date: 20120801
Lead Channel Impedance Value: 350 Ohm
Lead Channel Impedance Value: 775 Ohm
Lead Channel Pacing Threshold Amplitude: 0.75 V
Lead Channel Pacing Threshold Pulse Width: 0.5 ms
Lead Channel Pacing Threshold Pulse Width: 0.5 ms
Lead Channel Setting Pacing Pulse Width: 0.5 ms
Lead Channel Setting Sensing Sensitivity: 0.5 mV
MDC IDC LEAD IMPLANT DT: 20120801
MDC IDC LEAD LOCATION: 753860
MDC IDC MSMT LEADCHNL LV PACING THRESHOLD PULSEWIDTH: 0.5 ms
MDC IDC MSMT LEADCHNL RA PACING THRESHOLD AMPLITUDE: 0.75 V
MDC IDC MSMT LEADCHNL RA SENSING INTR AMPL: 4.9 mV
MDC IDC MSMT LEADCHNL RV IMPEDANCE VALUE: 437.5 Ohm
MDC IDC MSMT LEADCHNL RV PACING THRESHOLD AMPLITUDE: 0.75 V
MDC IDC MSMT LEADCHNL RV SENSING INTR AMPL: 12 mV
MDC IDC PG SERIAL: 7001284
MDC IDC SESS DTM: 20180507123948
MDC IDC SET LEADCHNL LV PACING AMPLITUDE: 2 V
MDC IDC SET LEADCHNL RA PACING AMPLITUDE: 1.75 V
MDC IDC SET LEADCHNL RV PACING AMPLITUDE: 2 V
MDC IDC SET LEADCHNL RV PACING PULSEWIDTH: 0.5 ms
MDC IDC STAT BRADY RA PERCENT PACED: 57 %
MDC IDC STAT BRADY RV PERCENT PACED: 90 %

## 2016-05-27 NOTE — Progress Notes (Signed)
CRT-D device check in office. Thresholds and sensing consistent with previous device measurements. Lead impedance trends stable over time. 2,139 mode switch episodes recorded, (4.0% burden)+ Warfarin, longest 10 hours 15 minutes. No ventricular arrhythmia episodes recorded. PMT episode noted, no EGM available, no VA conduction during test this session. Patient bi-ventricularly pacing 90% of the time. Device programmed with appropriate safety margins. [place] is below baseline, possible fluid accumulation. No changes made this session. Estimated longevity 2.4 years. Patient education completed including shock plan. ROV with GT in 6 months.

## 2016-05-28 ENCOUNTER — Encounter: Payer: Self-pay | Admitting: Interventional Cardiology

## 2016-05-30 ENCOUNTER — Ambulatory Visit (INDEPENDENT_AMBULATORY_CARE_PROVIDER_SITE_OTHER): Payer: Medicare Other | Admitting: *Deleted

## 2016-05-30 DIAGNOSIS — I82409 Acute embolism and thrombosis of unspecified deep veins of unspecified lower extremity: Secondary | ICD-10-CM | POA: Diagnosis not present

## 2016-05-30 DIAGNOSIS — I482 Chronic atrial fibrillation, unspecified: Secondary | ICD-10-CM

## 2016-05-30 DIAGNOSIS — I480 Paroxysmal atrial fibrillation: Secondary | ICD-10-CM | POA: Diagnosis not present

## 2016-05-30 DIAGNOSIS — Z5181 Encounter for therapeutic drug level monitoring: Secondary | ICD-10-CM | POA: Diagnosis not present

## 2016-05-30 LAB — POCT INR: INR: 3.3

## 2016-06-06 ENCOUNTER — Ambulatory Visit (INDEPENDENT_AMBULATORY_CARE_PROVIDER_SITE_OTHER): Payer: Medicare Other | Admitting: *Deleted

## 2016-06-06 DIAGNOSIS — Z5181 Encounter for therapeutic drug level monitoring: Secondary | ICD-10-CM | POA: Diagnosis not present

## 2016-06-06 DIAGNOSIS — I82409 Acute embolism and thrombosis of unspecified deep veins of unspecified lower extremity: Secondary | ICD-10-CM

## 2016-06-06 DIAGNOSIS — I480 Paroxysmal atrial fibrillation: Secondary | ICD-10-CM | POA: Diagnosis not present

## 2016-06-06 DIAGNOSIS — I482 Chronic atrial fibrillation, unspecified: Secondary | ICD-10-CM

## 2016-06-06 LAB — POCT INR: INR: 3.3

## 2016-06-09 ENCOUNTER — Encounter (HOSPITAL_COMMUNITY): Payer: Self-pay | Admitting: Emergency Medicine

## 2016-06-09 ENCOUNTER — Emergency Department (HOSPITAL_COMMUNITY): Payer: Medicare Other

## 2016-06-09 ENCOUNTER — Observation Stay (HOSPITAL_COMMUNITY)
Admission: EM | Admit: 2016-06-09 | Discharge: 2016-06-11 | Disposition: A | Discharge status: Home / Self Care | Payer: Medicare Other | Attending: Family Medicine | Admitting: Family Medicine

## 2016-06-09 DIAGNOSIS — I480 Paroxysmal atrial fibrillation: Secondary | ICD-10-CM | POA: Diagnosis not present

## 2016-06-09 DIAGNOSIS — N183 Chronic kidney disease, stage 3 unspecified: Secondary | ICD-10-CM | POA: Diagnosis present

## 2016-06-09 DIAGNOSIS — R600 Localized edema: Secondary | ICD-10-CM

## 2016-06-09 DIAGNOSIS — Z79899 Other long term (current) drug therapy: Secondary | ICD-10-CM | POA: Diagnosis not present

## 2016-06-09 DIAGNOSIS — R0902 Hypoxemia: Secondary | ICD-10-CM | POA: Diagnosis present

## 2016-06-09 DIAGNOSIS — J449 Chronic obstructive pulmonary disease, unspecified: Secondary | ICD-10-CM | POA: Diagnosis not present

## 2016-06-09 DIAGNOSIS — Z7901 Long term (current) use of anticoagulants: Secondary | ICD-10-CM | POA: Diagnosis not present

## 2016-06-09 DIAGNOSIS — Z87891 Personal history of nicotine dependence: Secondary | ICD-10-CM | POA: Insufficient documentation

## 2016-06-09 DIAGNOSIS — R059 Cough, unspecified: Secondary | ICD-10-CM

## 2016-06-09 DIAGNOSIS — E1122 Type 2 diabetes mellitus with diabetic chronic kidney disease: Secondary | ICD-10-CM | POA: Insufficient documentation

## 2016-06-09 DIAGNOSIS — I130 Hypertensive heart and chronic kidney disease with heart failure and stage 1 through stage 4 chronic kidney disease, or unspecified chronic kidney disease: Secondary | ICD-10-CM | POA: Diagnosis not present

## 2016-06-09 DIAGNOSIS — R05 Cough: Secondary | ICD-10-CM | POA: Diagnosis present

## 2016-06-09 DIAGNOSIS — I5023 Acute on chronic systolic (congestive) heart failure: Secondary | ICD-10-CM | POA: Diagnosis not present

## 2016-06-09 DIAGNOSIS — Z95810 Presence of automatic (implantable) cardiac defibrillator: Secondary | ICD-10-CM | POA: Diagnosis not present

## 2016-06-09 DIAGNOSIS — Z955 Presence of coronary angioplasty implant and graft: Secondary | ICD-10-CM | POA: Diagnosis not present

## 2016-06-09 DIAGNOSIS — D631 Anemia in chronic kidney disease: Secondary | ICD-10-CM | POA: Insufficient documentation

## 2016-06-09 DIAGNOSIS — I2510 Atherosclerotic heart disease of native coronary artery without angina pectoris: Secondary | ICD-10-CM | POA: Diagnosis not present

## 2016-06-09 DIAGNOSIS — I509 Heart failure, unspecified: Secondary | ICD-10-CM

## 2016-06-09 LAB — MAGNESIUM: MAGNESIUM: 2 mg/dL (ref 1.7–2.4)

## 2016-06-09 LAB — URINALYSIS, ROUTINE W REFLEX MICROSCOPIC
Bacteria, UA: NONE SEEN
Bilirubin Urine: NEGATIVE
Glucose, UA: NEGATIVE mg/dL
Ketones, ur: NEGATIVE mg/dL
Leukocytes, UA: NEGATIVE
Nitrite: NEGATIVE
Protein, ur: NEGATIVE mg/dL
Specific Gravity, Urine: 1.011 (ref 1.005–1.030)
Squamous Epithelial / LPF: NONE SEEN
pH: 7 (ref 5.0–8.0)

## 2016-06-09 LAB — CBC WITH DIFFERENTIAL/PLATELET
Basophils Absolute: 0 10*3/uL (ref 0.0–0.1)
Basophils Relative: 0 %
Eosinophils Absolute: 0.1 10*3/uL (ref 0.0–0.7)
Eosinophils Relative: 2 %
HCT: 37.6 % — ABNORMAL LOW (ref 39.0–52.0)
Hemoglobin: 12.5 g/dL — ABNORMAL LOW (ref 13.0–17.0)
Lymphocytes Relative: 14 %
Lymphs Abs: 1.1 10*3/uL (ref 0.7–4.0)
MCH: 32.6 pg (ref 26.0–34.0)
MCHC: 33.2 g/dL (ref 30.0–36.0)
MCV: 97.9 fL (ref 78.0–100.0)
Monocytes Absolute: 0.9 10*3/uL (ref 0.1–1.0)
Monocytes Relative: 12 %
Neutro Abs: 5.7 10*3/uL (ref 1.7–7.7)
Neutrophils Relative %: 73 %
Platelets: 84 10*3/uL — ABNORMAL LOW (ref 150–400)
RBC: 3.84 MIL/uL — ABNORMAL LOW (ref 4.22–5.81)
RDW: 16.3 % — ABNORMAL HIGH (ref 11.5–15.5)
WBC: 7.9 10*3/uL (ref 4.0–10.5)

## 2016-06-09 LAB — COMPREHENSIVE METABOLIC PANEL
ALT: 15 U/L — ABNORMAL LOW (ref 17–63)
AST: 18 U/L (ref 15–41)
Albumin: 3.2 g/dL — ABNORMAL LOW (ref 3.5–5.0)
Alkaline Phosphatase: 59 U/L (ref 38–126)
Anion gap: 9 (ref 5–15)
BUN: 20 mg/dL (ref 6–20)
CO2: 31 mmol/L (ref 22–32)
Calcium: 8.2 mg/dL — ABNORMAL LOW (ref 8.9–10.3)
Chloride: 98 mmol/L — ABNORMAL LOW (ref 101–111)
Creatinine, Ser: 1.38 mg/dL — ABNORMAL HIGH (ref 0.61–1.24)
GFR calc Af Amer: 56 mL/min — ABNORMAL LOW (ref >60)
GFR calc non Af Amer: 48 mL/min — ABNORMAL LOW (ref >60)
Glucose, Bld: 129 mg/dL — ABNORMAL HIGH (ref 65–99)
Potassium: 3.7 mmol/L (ref 3.5–5.1)
Sodium: 138 mmol/L (ref 135–145)
Total Bilirubin: 1.6 mg/dL — ABNORMAL HIGH (ref 0.3–1.2)
Total Protein: 5.9 g/dL — ABNORMAL LOW (ref 6.5–8.1)

## 2016-06-09 LAB — PROTIME-INR
INR: 1.77
Prothrombin Time: 20.8 seconds — ABNORMAL HIGH (ref 11.4–15.2)

## 2016-06-09 LAB — BRAIN NATRIURETIC PEPTIDE: B Natriuretic Peptide: 1356.7 pg/mL — ABNORMAL HIGH (ref 0.0–100.0)

## 2016-06-09 LAB — TSH: TSH: 2.153 u[IU]/mL (ref 0.350–4.500)

## 2016-06-09 LAB — TROPONIN I: TROPONIN I: 0.08 ng/mL — AB (ref <0.03)

## 2016-06-09 LAB — DIGOXIN LEVEL: DIGOXIN LVL: 0.4 ng/mL — AB (ref 0.8–2.0)

## 2016-06-09 MED ORDER — ALLOPURINOL 100 MG PO TABS
100.0000 mg | ORAL_TABLET | Freq: Every day | ORAL | Status: DC
  Administered 2016-06-10 – 2016-06-11 (×2): 100 mg via ORAL
  Filled 2016-06-09 (×2): qty 1

## 2016-06-09 MED ORDER — TIOTROPIUM BROMIDE MONOHYDRATE 18 MCG IN CAPS
18.0000 ug | ORAL_CAPSULE | Freq: Every day | RESPIRATORY_TRACT | Status: DC
  Administered 2016-06-10 – 2016-06-11 (×2): 18 ug via RESPIRATORY_TRACT
  Filled 2016-06-09: qty 5

## 2016-06-09 MED ORDER — ACETAMINOPHEN 325 MG PO TABS: 650.0000 mg | ORAL_TABLET | Freq: Four times a day (QID) | ORAL | Status: DC | PRN

## 2016-06-09 MED ORDER — MOMETASONE FURO-FORMOTEROL FUM 100-5 MCG/ACT IN AERO
2.0000 | INHALATION_SPRAY | Freq: Two times a day (BID) | RESPIRATORY_TRACT | Status: DC
  Administered 2016-06-10 – 2016-06-11 (×3): 2 via RESPIRATORY_TRACT
  Filled 2016-06-09: qty 8.8

## 2016-06-09 MED ORDER — LORATADINE 10 MG PO TABS
10.0000 mg | ORAL_TABLET | Freq: Every day | ORAL | Status: DC
  Administered 2016-06-10 – 2016-06-11 (×2): 10 mg via ORAL
  Filled 2016-06-09 (×2): qty 1

## 2016-06-09 MED ORDER — ATORVASTATIN CALCIUM 10 MG PO TABS
10.0000 mg | ORAL_TABLET | Freq: Every day | ORAL | Status: DC
  Administered 2016-06-09 – 2016-06-10 (×2): 10 mg via ORAL
  Filled 2016-06-09 (×2): qty 1

## 2016-06-09 MED ORDER — FUROSEMIDE 10 MG/ML IJ SOLN
40.0000 mg | Freq: Two times a day (BID) | INTRAMUSCULAR | Status: DC
  Administered 2016-06-10 – 2016-06-11 (×3): 40 mg via INTRAVENOUS
  Filled 2016-06-09 (×3): qty 4

## 2016-06-09 MED ORDER — FLUTICASONE PROPIONATE 50 MCG/ACT NA SUSP
2.0000 | Freq: Every evening | NASAL | Status: DC | PRN
  Administered 2016-06-10: 2 via NASAL
  Filled 2016-06-09: qty 16

## 2016-06-09 MED ORDER — CARVEDILOL 12.5 MG PO TABS
12.5000 mg | ORAL_TABLET | Freq: Two times a day (BID) | ORAL | Status: DC
  Administered 2016-06-10 – 2016-06-11 (×4): 12.5 mg via ORAL
  Filled 2016-06-09 (×4): qty 1

## 2016-06-09 MED ORDER — LOSARTAN POTASSIUM 50 MG PO TABS
50.0000 mg | ORAL_TABLET | Freq: Every day | ORAL | Status: DC
  Administered 2016-06-10 – 2016-06-11 (×2): 50 mg via ORAL
  Filled 2016-06-09 (×2): qty 1

## 2016-06-09 MED ORDER — WARFARIN - PHARMACIST DOSING INPATIENT: Freq: Every day | Status: DC

## 2016-06-09 MED ORDER — GUAIFENESIN ER 600 MG PO TB12
600.0000 mg | ORAL_TABLET | Freq: Two times a day (BID) | ORAL | Status: DC
  Administered 2016-06-09 – 2016-06-11 (×4): 600 mg via ORAL
  Filled 2016-06-09 (×4): qty 1

## 2016-06-09 MED ORDER — ALBUTEROL SULFATE (2.5 MG/3ML) 0.083% IN NEBU: 3.0000 mL | INHALATION_SOLUTION | RESPIRATORY_TRACT | Status: DC | PRN

## 2016-06-09 MED ORDER — ALFUZOSIN HCL ER 10 MG PO TB24
10.0000 mg | ORAL_TABLET | Freq: Every day | ORAL | Status: DC
  Administered 2016-06-10 – 2016-06-11 (×2): 10 mg via ORAL
  Filled 2016-06-09 (×2): qty 1

## 2016-06-09 MED ORDER — ACETAMINOPHEN 650 MG RE SUPP: 650.0000 mg | Freq: Four times a day (QID) | RECTAL | Status: DC | PRN

## 2016-06-09 MED ORDER — WARFARIN SODIUM 3 MG PO TABS
3.0000 mg | ORAL_TABLET | ORAL | Status: AC
  Administered 2016-06-09: 3 mg via ORAL
  Filled 2016-06-09: qty 1

## 2016-06-09 MED ORDER — DIGOXIN 125 MCG PO TABS: 0.0625 mg | ORAL_TABLET | Freq: Every day | ORAL | Status: DC

## 2016-06-09 MED ORDER — FUROSEMIDE 10 MG/ML IJ SOLN
80.0000 mg | Freq: Once | INTRAMUSCULAR | Status: AC
  Administered 2016-06-09: 80 mg via INTRAVENOUS
  Filled 2016-06-09: qty 8

## 2016-06-09 NOTE — ED Notes (Signed)
ED Provider at bedside. 

## 2016-06-09 NOTE — ED Provider Notes (Signed)
Medical screening examination/treatment/procedure(s) were conducted as a shared visit with non-physician practitioner(s) and myself.  I personally evaluated the patient during the encounter.   EKG Interpretation  Date/Time:  Sunday Jun 09 2016 15:42:23 EDT Ventricular Rate:  74 PR Interval:    QRS Duration: 130 QT Interval:  416 QTC Calculation: 446 R Axis:   -154 Text Interpretation:  A-V dual-paced complexes w/ some inhibition No further analysis attempted due to paced rhythm similar to prior EKG  Confirmed by Marija Calamari MD, [name] ([id number]) on 06/09/2016 6:37:07 PM        75  year old male who presents with bilateral lower extremity swelling, progressively worsening over one to 2 months. History of coronary artery disease, chronic systolic heart failure with EF of 30-35%, status post AICD, chronic atrial fibrillation on Coumadin, COPD, and diabetes. Wears 2 L oxygen when necessary. He states that he has been seen at [facility] recently and was told to increase his Lasix. States that he is taking 40 mg of Lasix twice a day over the last week without significant improvement. States chronic dyspnea on exertion, that is unchanged. Denies any orthopnea, PND, chest pain, syncope or near syncope.   With bilateral pitting edema. Breathing comfortably with 2L [place]. CXR visualized and with mild pulmonary vascular congestion. No frank pulmonary edema. BNP elevated 1000s as well. Will give 80 mg IV lasix for likely CHF exacerbation. He does have good response to lasix. However, with ambulation, he desaturates to 82% and with significant dyspnea. Will plan on admission for diuresis.    Forde Dandy, MD 06/09/16 2017

## 2016-06-09 NOTE — ED Triage Notes (Signed)
Patient here today from home with complaints of bilateral leg swelling that started months ago. Reports that he had an increase in his fluid pill but "still retaining fluid". States that he has a cough, no SOB.

## 2016-06-09 NOTE — Progress Notes (Addendum)
CRITICAL VALUE ALERT  Critical value received:  Troponin 0.08  Date of notification:  06/09/16  Time of notification:  2331  Critical value read back:Yes.    Nurse who received alert:  Willene Hatchet, RN  MD notified (1st page):  Hal Hope  Time of first page:  2334  MD notified (2nd page):  Time of second page:  Responding MD: Hal Hope  Time MD responded:  2340

## 2016-06-09 NOTE — H&P (Signed)
History and Physical    Troy [name] JQZ:[id number] DOB: 03/20/1940 DOA: 06/09/2016  PCP: Elwyn Reach, MD  Patient coming from: Home.  Chief Complaint: Lower extremity swelling and shortness of breath.  HPI: Troy [name] is a 76 y.o. male with history of CHF, paroxysmal atrial fibrillation, COPD, chronic anemia and thrombocytopenia, chronic kidney disease presents to the ER with complaints of increasing lower extremity swelling. Shortness of breath. Patient states he has been having increasing lower extremity edema over the last few weeks. Yesterday became more short of breath even at rest. Has been having some cough but denies any chest pain fever or chills. Patient states he may have gained at least 5 pounds.   ED Course: In the ER patient's chest x-ray shows cardiomegaly with congestion and pleural effusion. EKG shows paced rhythm. Patient was given Lasix 80 mg IV in the ER. Patient states he was instructed by his PCP to increase his Lasix a few days ago and had taken 1 dose of increased Lasix 2 days ago.  Review of Systems: As per HPI, rest all negative.   Past Medical History:  Diagnosis Date  . Atrial fibrillation ([place])   . CAD (coronary artery disease) 1996   status post PCI of the RCA   . Congestive heart failure, unspecified   . COPD (chronic obstructive pulmonary disease) (HCC)    Pt on home O2 at night and PRN, unsure of date of diagnosis.  . Diabetes mellitus, type 2 ([place])   . DJD (degenerative joint disease)   . GERD (gastroesophageal reflux disease)   . Gout   . HTN (hypertension)   . 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 Nephrolithiasis   . Other primary cardiomyopathies   . Personal history of DVT (deep vein thrombosis)   . Prostatitis   . Shingles     Past Surgical History:  Procedure Laterality Date  . BACK SURGERY    . CARDIAC DEFIBRILLATOR PLACEMENT    . CORONARY STENT PLACEMENT    . PACEMAKER INSERTION       reports that he quit  smoking about 36 years ago. His smoking use included Cigarettes. He has a 200.00 pack-year smoking history. He has never used smokeless tobacco. He reports that he does not drink alcohol or use drugs.  Allergies  Allergen Reactions  . Benadryl [Diphenhydramine Hcl] Nausea And Vomiting    Family History  Problem Relation Age of Onset  . Heart disease Father   . Cancer Father        LUNG  . Colon cancer Mother   . Stroke Paternal Uncle   . Heart attack Neg Hx   . Hyperlipidemia Neg Hx   . Hypertension Neg Hx     Prior to Admission medications   Medication Sig Start Date End Date Taking? Authorizing Provider  albuterol (ACCUNEB) 1.25 MG/3ML nebulizer solution Take 3 mLs by nebulization 4 (four) times daily. 08/21/15  Yes [provider]  alfuzosin (UROXATRAL) 10 MG 24 hr tablet Take 10 mg by mouth daily with breakfast.    Yes [provider]  allopurinol (ZYLOPRIM) 100 MG tablet Take 100 mg by mouth daily.   Yes [provider]  atorvastatin (LIPITOR) 10 MG tablet Take 1 tablet (10 mg total) by mouth at bedtime. 08/03/14  Yes Jettie Booze, MD  carvedilol (COREG) 12.5 MG tablet Take 1 tablet (12.5 mg total) by mouth 2 (two) times daily with a meal. 03/03/15  Yes Larae Grooms S,  MD  cetirizine (ZYRTEC) 10 MG tablet Take 10 mg by mouth daily.   Yes [provider]  digoxin (LANOXIN) 0.125 MG tablet Take 0.5 tablets (0.0625 mg total) by mouth daily. 06/23/15  Yes Jettie Booze, MD  fluticasone [facility]) 50 MCG/ACT nasal spray Place 2 sprays into both nostrils at bedtime as needed for allergies.  04/07/13  Yes [provider]  furosemide (LASIX) 40 MG tablet Take 40 mg by mouth 2 (two) times daily.   Yes [provider]  guaiFENesin (MUCINEX) 600 MG 12 hr tablet Take 1 tablet (600 mg total) by mouth 2 (two) times daily. 03/10/16  Yes Kathie Dike, MD  losartan (COZAAR) 50 MG tablet Take 1 tablet (50 mg total) by mouth  daily. 05/03/16 08/01/16 Yes Jettie Booze, MD  mometasone-formoterol [facility]) 100-5 MCG/ACT AERO Inhale 2 puffs into the lungs 2 (two) times daily. 09/13/15  Yes Vann, Jessica U, DO  PROAIR HFA 108 (90 BASE) MCG/ACT inhaler INHALE 2 PUFFS EVERY 4 HOURS AS NEEDED FOR WHEEZING OR SHORTNESS OF BREATH. 09/05/14  Yes Byrum, Rose Fillers, MD  Spacer/Aero-Holding Chambers (AEROCHAMBER MV) inhaler Use as instructed 03/12/16  Yes Collene Gobble, MD  tiotropium (SPIRIVA) 18 MCG inhalation capsule Place 1 capsule (18 mcg total) into inhaler and inhale daily. 03/11/16  Yes Kathie Dike, MD  warfarin (COUMADIN) 1 MG tablet Take 2 tabs daily except 3 tabs on Wednesday & Saturday or as directed by [facility] Patient taking differently: Take 2mg  by mouth on Tues, Wed, Fri, Sat & Sun. Take 3mg  by mouth on Monday andThursday as directed by clinic. 09/13/15  Yes Vann, Jessica U, DO  furosemide (LASIX) 80 MG tablet Take 1 tablet (80 mg total) by mouth 2 (two) times daily. 05/14/16 05/17/16  Long, Wonda Olds, MD  potassium chloride (K-DUR) 10 MEQ tablet Take 1 tablet (10 mEq total) by mouth 2 (two) times daily. Patient not taking: Reported on 06/09/2016 03/11/16   Jettie Booze, MD    Physical Exam: Vitals:   06/09/16 1740 06/09/16 1945 06/09/16 2059 06/09/16 2203  BP: (!) 143/75 (!) 142/112 (!) 161/69 (!) 154/67  Pulse: 74 96 (!) 102 80  Resp: 19 (!) 21 17 18   Temp:    98.8 F (37.1 C)  TempSrc:    Oral  SpO2: 94% 97% 97% 96%  Weight:    79.6 kg (175 lb 6.4 oz)  Height:    5\' 4"  (1.626 m)      Constitutional: Moderately built and nourished. Vitals:   06/09/16 1740 06/09/16 1945 06/09/16 2059 06/09/16 2203  BP: (!) 143/75 (!) 142/112 (!) 161/69 (!) 154/67  Pulse: 74 96 (!) 102 80  Resp: 19 (!) 21 17 18   Temp:    98.8 F (37.1 C)  TempSrc:    Oral  SpO2: 94% 97% 97% 96%  Weight:    79.6 kg (175 lb 6.4 oz)  Height:    5\' 4"  (1.626 m)   Eyes: Anicteric no pallor. ENMT: No discharge from the  ears eyes nose and mouth. Neck: JVD is elevated no mass felt. Respiratory: No rhonchi or crepitations. Cardiovascular: S1-S2 heard. Abdomen: Soft nontender bowel sounds present. Musculoskeletal: No edema. No joint effusion. Skin: No rash. Skin appears warm. Neurologic: Alert awake oriented to time place and person. Moves all extremities. Psychiatric: Appears normal. Normal affect.   Labs on Admission: I have personally reviewed following labs and imaging studies  CBC:  Recent Labs Lab 06/09/16 1602  WBC 7.9  NEUTROABS 5.7  HGB 12.5*  HCT 37.6*  MCV 97.9  PLT 84*   Basic Metabolic Panel:  Recent Labs Lab 06/09/16 1602  NA 138  K 3.7  CL 98*  CO2 31  GLUCOSE 129*  BUN 20  CREATININE 1.38*  CALCIUM 8.2*   GFR: Estimated Creatinine Clearance: 43.4 mL/min (A) (by C-G formula based on SCr of 1.38 mg/dL (H)). Liver Function Tests:  Recent Labs Lab 06/09/16 1602  AST 18  ALT 15*  ALKPHOS 59  BILITOT 1.6*  PROT 5.9*  ALBUMIN 3.2*   No results for input(s): LIPASE, AMYLASE in the last 168 hours. No results for input(s): AMMONIA in the last 168 hours. Coagulation Profile:  Recent Labs Lab 06/06/16 1336 06/09/16 1602  INR 3.3 1.77   Cardiac Enzymes: No results for input(s): CKTOTAL, CKMB, CKMBINDEX, TROPONINI in the last 168 hours. BNP (last 3 results) No results for input(s): PROBNP in the last 8760 hours. HbA1C: No results for input(s): HGBA1C in the last 72 hours. CBG: No results for input(s): GLUCAP in the last 168 hours. Lipid Profile: No results for input(s): CHOL, HDL, LDLCALC, TRIG, CHOLHDL, LDLDIRECT in the last 72 hours. Thyroid Function Tests: No results for input(s): TSH, T4TOTAL, FREET4, T3FREE, THYROIDAB in the last 72 hours. Anemia Panel: No results for input(s): VITAMINB12, FOLATE, FERRITIN, TIBC, IRON, RETICCTPCT in the last 72 hours. Urine analysis:    Component Value Date/Time   COLORURINE YELLOW 06/09/2016 [place] 06/09/2016 1539   LABSPEC 1.011 06/09/2016 1539   PHURINE 7.0 06/09/2016 1539   GLUCOSEU NEGATIVE 06/09/2016 1539   HGBUR SMALL (A) 06/09/2016 1539   BILIRUBINUR NEGATIVE 06/09/2016 1539   KETONESUR NEGATIVE 06/09/2016 1539   PROTEINUR NEGATIVE 06/09/2016 1539   UROBILINOGEN 0.2 11/21/2013 1946   NITRITE NEGATIVE 06/09/2016 1539   LEUKOCYTESUR NEGATIVE 06/09/2016 1539   Sepsis Labs: @LABRCNTIP (procalcitonin:4,lacticidven:4) )No results found for this or any previous visit (from the past 240 hour(s)).   Radiological Exams on Admission: Dg Chest 2 View  Result Date: 06/09/2016 CLINICAL DATA:  Bilateral leg swelling beginning this morning. History of diabetes, hypertension, COPD, CHF. EXAM: CHEST  2 VIEW COMPARISON:  Chest radiograph May 30, 2016 FINDINGS: Cardiac silhouette is mildly enlarged and unchanged. Mediastinal silhouette is nonsuspicious, calcified aortic knob. Similar pulmonary vascular congestion, small pleural effusions. Increased lung volumes with flattened hemidiaphragms. No focal consolidation. No pneumothorax. Dual lead LEFT AICD. Osteopenia. Soft tissue planes are nonsuspicious. IMPRESSION: Stable examination: Mild cardiomegaly and pulmonary vascular congestion. COPD with small pleural effusions. Electronically Signed   By: Elon Alas M.D.   On: 06/09/2016 15:55    EKG: Independently reviewed. Paced rhythm.  Assessment/Plan Principal Problem:   Acute on chronic systolic CHF (congestive heart failure) (HCC) Active Problems:   COPD (chronic obstructive pulmonary disease) (HCC)   Chronic kidney disease, stage 3   Coronary artery disease involving native coronary artery of native heart without angina pectoris   Paroxysmal atrial fibrillation (HCC)   CHF (congestive heart failure) ([place])    1. Acute on chronic systolic heart failure last EF measured in February 2018 was 30-35% status post AICD placement - patient has received 80 mg of IV Lasix in the ER for  which patient has had at least 1800 mL output. I have placed patient on Lasix 40 mg IV every 12. Closely follow intake output and metabolic panel. May have to hold patient's Cozaar if creatinine worsens. Patient is on digoxin and check digoxin levels. Check Dopplers to  rule out DVT. 2. CAD status post stenting in 1996 - denies any chest pain. Cycle cardiac markers. Patient is on statins Coreg and on Coumadin. 3. COPD - not actively wheezing. 4. Chronic kidney disease stage III - creatinine appears to be at baseline. Hold Cozaar if there is worsening of creatinine. 5. Chronic anemia probably secondary to renal disease - follow CBC. 6. Chronic thrombocytopenia with mild worsening - follow CBC closely. 7. Paroxysmal atrial fibrillation - chads 2 vasc score is more than 2 patient is on Coumadin per pharmacy. Rate control at this time. Patient is on Coreg. Digoxin levels are pending.   DVT prophylaxis: Coumadin. Code Status: Full code.  Family Communication: Discussed with patient.  Disposition Plan: Home.  Consults called: None.  Admission status: Inpatient.    Rise Patience MD [facility] Pager [phone].  If 7PM-7AM, please contact night-coverage [email or web address] Password [email or web address]  06/09/2016, 10:17 PM

## 2016-06-09 NOTE — Progress Notes (Signed)
ANTICOAGULATION CONSULT NOTE - Initial Consult  Pharmacy Consult for Warfarin Indication: atrial fibrillation  Allergies  Allergen Reactions  . Benadryl [Diphenhydramine Hcl] Nausea And Vomiting    Patient Measurements: Height: 5\' 4"  (162.6 cm) Weight: 175 lb 6.4 oz (79.6 kg) IBW/kg (Calculated) : 59.2   Vital Signs: Temp: 98.8 F (37.1 C) (05/20 2203) Temp Source: Oral (05/20 2203) BP: 154/67 (05/20 2203) Pulse Rate: 80 (05/20 2203)  Labs:  Recent Labs  06/09/16 1602  HGB 12.5*  HCT 37.6*  PLT 84*  LABPROT 20.8*  INR 1.77  CREATININE 1.38*    Estimated Creatinine Clearance: 43.4 mL/min (A) (by C-G formula based on SCr of 1.38 mg/dL (H)).   Medical History: Past Medical History:  Diagnosis Date  . Atrial fibrillation ([place])   . CAD (coronary artery disease) 1996   status post PCI of the RCA   . Congestive heart failure, unspecified   . COPD (chronic obstructive pulmonary disease) (HCC)    Pt on home O2 at night and PRN, unsure of date of diagnosis.  . Diabetes mellitus, type 2 ([place])   . DJD (degenerative joint disease)   . GERD (gastroesophageal reflux disease)   . Gout   . HTN (hypertension)   . 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 Nephrolithiasis   . Other primary cardiomyopathies   . Personal history of DVT (deep vein thrombosis)   . Prostatitis   . Shingles     Medications:  Scheduled:  . [START ON 06/10/2016] alfuzosin  10 mg Oral Q breakfast  . [START ON 06/10/2016] allopurinol  100 mg Oral Daily  . atorvastatin  10 mg Oral QHS  . [START ON 06/10/2016] carvedilol  12.5 mg Oral BID WC  . [START ON 06/10/2016] digoxin  0.0625 mg Oral Daily  . [START ON 06/10/2016] furosemide  40 mg Intravenous Q12H  . guaiFENesin  600 mg Oral BID  . [START ON 06/10/2016] loratadine  10 mg Oral Daily  . [START ON 06/10/2016] losartan  50 mg Oral Daily  . mometasone-formoterol  2 puff Inhalation BID  . [START ON 06/10/2016] tiotropium  18 mcg Inhalation Daily  .  warfarin  3 mg Oral NOW  . [START ON 06/10/2016] Warfarin - Pharmacist Dosing Inpatient   Does not apply q1800   Infusions:    Assessment: 76 yoM c/o bilateral LE swelling on chronic warfarin for A-fib. HD 2 mg Tues/Wed/Thur/Fri/Sat/Sun and 3 mg Mon/Thur  INR=1.77 on admisssion LD 5/19 1700  Goal of Therapy:  INR 2-3    Plan:  Warfarin 3 mg x1 now Daily PT/INR  Lawana Pai R 06/09/2016,10:25 PM

## 2016-06-09 NOTE — ED Notes (Signed)
Patient transported to X-ray 

## 2016-06-09 NOTE — ED Notes (Signed)
While ambulating in hallway, patient's O2 saturation dropped to 82% room air. Reapplied 2L [place] after returning patient to his room.

## 2016-06-10 ENCOUNTER — Inpatient Hospital Stay (HOSPITAL_BASED_OUTPATIENT_CLINIC_OR_DEPARTMENT_OTHER): Payer: Medicare Other

## 2016-06-10 DIAGNOSIS — R0602 Shortness of breath: Secondary | ICD-10-CM

## 2016-06-10 DIAGNOSIS — J449 Chronic obstructive pulmonary disease, unspecified: Secondary | ICD-10-CM | POA: Diagnosis not present

## 2016-06-10 DIAGNOSIS — N183 Chronic kidney disease, stage 3 (moderate): Secondary | ICD-10-CM

## 2016-06-10 DIAGNOSIS — R609 Edema, unspecified: Secondary | ICD-10-CM

## 2016-06-10 DIAGNOSIS — I5023 Acute on chronic systolic (congestive) heart failure: Secondary | ICD-10-CM | POA: Diagnosis not present

## 2016-06-10 DIAGNOSIS — I130 Hypertensive heart and chronic kidney disease with heart failure and stage 1 through stage 4 chronic kidney disease, or unspecified chronic kidney disease: Secondary | ICD-10-CM | POA: Diagnosis not present

## 2016-06-10 DIAGNOSIS — I480 Paroxysmal atrial fibrillation: Secondary | ICD-10-CM | POA: Diagnosis not present

## 2016-06-10 DIAGNOSIS — R600 Localized edema: Secondary | ICD-10-CM | POA: Diagnosis not present

## 2016-06-10 LAB — BASIC METABOLIC PANEL
Anion gap: 8 (ref 5–15)
Anion gap: 10 (ref 5–15)
BUN: 20 mg/dL (ref 6–20)
BUN: 18 mg/dL (ref 6–20)
CALCIUM: 8.2 mg/dL — AB (ref 8.9–10.3)
CALCIUM: 8.2 mg/dL — AB (ref 8.9–10.3)
CHLORIDE: 97 mmol/L — AB (ref 101–111)
CHLORIDE: 98 mmol/L — AB (ref 101–111)
CO2: 34 mmol/L — ABNORMAL HIGH (ref 22–32)
CO2: 31 mmol/L (ref 22–32)
CREATININE: 1.41 mg/dL — AB (ref 0.61–1.24)
CREATININE: 1.33 mg/dL — AB (ref 0.61–1.24)
GFR, EST AFRICAN AMERICAN: 54 mL/min — AB (ref >60)
GFR, EST AFRICAN AMERICAN: 58 mL/min — AB (ref >60)
GFR, EST NON AFRICAN AMERICAN: 47 mL/min — AB (ref >60)
GFR, EST NON AFRICAN AMERICAN: 50 mL/min — AB (ref >60)
Glucose, Bld: 117 mg/dL — ABNORMAL HIGH (ref 65–99)
Glucose, Bld: 102 mg/dL — ABNORMAL HIGH (ref 65–99)
POTASSIUM: 3.1 mmol/L — AB (ref 3.5–5.1)
Potassium: 3 mmol/L — ABNORMAL LOW (ref 3.5–5.1)
SODIUM: 139 mmol/L (ref 135–145)
SODIUM: 139 mmol/L (ref 135–145)

## 2016-06-10 LAB — TROPONIN I
TROPONIN I: 0.05 ng/mL — AB (ref <0.03)
Troponin I: 0.08 ng/mL (ref <0.03)
Troponin I: 0.08 ng/mL (ref <0.03)

## 2016-06-10 LAB — CBC
HCT: 36.4 % — ABNORMAL LOW (ref 39.0–52.0)
Hemoglobin: 11.8 g/dL — ABNORMAL LOW (ref 13.0–17.0)
MCH: 32 pg (ref 26.0–34.0)
MCHC: 32.4 g/dL (ref 30.0–36.0)
MCV: 98.6 fL (ref 78.0–100.0)
PLATELETS: 70 10*3/uL — AB (ref 150–400)
RBC: 3.69 MIL/uL — ABNORMAL LOW (ref 4.22–5.81)
RDW: 16.1 % — AB (ref 11.5–15.5)
WBC: 7.6 10*3/uL (ref 4.0–10.5)

## 2016-06-10 LAB — MAGNESIUM: MAGNESIUM: 1.9 mg/dL (ref 1.7–2.4)

## 2016-06-10 LAB — PROTIME-INR
INR: 1.72
Prothrombin Time: 20.4 seconds — ABNORMAL HIGH (ref 11.4–15.2)

## 2016-06-10 MED ORDER — GUAIFENESIN-DM 100-10 MG/5ML PO SYRP: 5.0000 mL | ORAL_SOLUTION | ORAL | Status: DC | PRN

## 2016-06-10 MED ORDER — WARFARIN SODIUM 3 MG PO TABS
3.0000 mg | ORAL_TABLET | Freq: Once | ORAL | Status: DC
  Filled 2016-06-10: qty 1

## 2016-06-10 MED ORDER — POTASSIUM CHLORIDE CRYS ER 20 MEQ PO TBCR
40.0000 meq | EXTENDED_RELEASE_TABLET | Freq: Once | ORAL | Status: AC
  Administered 2016-06-10: 40 meq via ORAL
  Filled 2016-06-10: qty 2

## 2016-06-10 MED ORDER — DIGOXIN 0.0625 MG HALF TABLET
0.0625 mg | ORAL_TABLET | Freq: Every day | ORAL | Status: DC
  Administered 2016-06-10 – 2016-06-11 (×2): 0.0625 mg via ORAL
  Filled 2016-06-10 (×2): qty 1

## 2016-06-10 NOTE — Progress Notes (Signed)
PROGRESS NOTE [facility]   Troy [name]   NTI:[id number] DOB: 11/18/1940  DOA: 06/09/2016 PCP: Elwyn Reach, MD   Brief Narrative:  76 year old male with medical history CHF, paroxysmal A. fib, COPD, chronic anemia and chronic kidney disease presented to the emergency department complaining of increasing lower extremity edema and shortness of breath. In the ED patient was found to have x-ray with congestion and pleural effusion. He was admitted for IV diuresis and further evaluation.  Subjective: Patient seen and examined, report significant improvement from admission. Denies chest pain and SOB. Leg slight swollen. No acute events overnight.  Assessment & Plan: Acute on chronic systolic heart failure - EF in 02/2016 30-35% s/p AICD placement  BNP >1000, + LE edema and SOB  Continue IV Lasix 40 mg BID  Patient on Coreg, digoxin and Losartan  Low salt diet, fluid restriction  Daily weights, Monitor I&O  Ted Hose Continue to monitor   CKD stage III - currently at baseline  Monitor Cr in AM as patient on IV lasix   COPD - no actively wheezing  Monitor for now   PAF - rate and rhythm well controlled  Continue current regimen   Anemia of chronic diseases - Hgb stable   DVT prophylaxis: Warfarin  Code Status: FULL  Family Communication: None at bedside  Disposition Plan: Home when medically stable   Consultants:   None   Procedures:   None   Antimicrobials: Anti-infectives    None        Objective: Vitals:   06/10/16 0220 06/10/16 0438 06/10/16 0832 06/10/16 0833  BP: (!) 135/52 (!) 132/57    Pulse: 73 72    Resp: 18 19    Temp: 99 F (37.2 C) 98.9 F (37.2 C)    TempSrc: Oral Oral    SpO2: 96% 97% 98% 98%  Weight:  79.5 kg (175 lb 4.3 oz)    Height:        Intake/Output Summary (Last 24 hours) at 06/10/16 1407 Last data filed at 06/10/16 1259  Gross per 24 hour  Intake              720 ml  Output             2341 ml  Net             -1621 ml   Filed Weights   06/09/16 1459 06/09/16 2203 06/10/16 0438  Weight: 79.4 kg (175 lb) 79.6 kg (175 lb 6.4 oz) 79.5 kg (175 lb 4.3 oz)    Examination:  General exam: Appears calm and comfortable  HEENT: AC/AT, PERRLA, OP moist and clear Respiratory system: Decreased breath sounds, bibasilar crackles, no wheezing Cardiovascular system: S1-S2, RRR, no JVD noted, 2/6 systolic murmur Gastrointestinal system: Abdomen is nondistended, soft and nontender.  Central nervous system: Alert and oriented. No focal neurological deficits. Extremities: Lower extremity pitting edema 1+ Skin: No rashes, lesions or ulcers Psychiatry: Judgement and insight appear normal. Mood & affect appropriate.    Data Reviewed: I have personally reviewed following labs and imaging studies  CBC:  Recent Labs Lab 06/09/16 1602 06/10/16 0455  WBC 7.9 7.6  NEUTROABS 5.7  --   HGB 12.5* 11.8*  HCT 37.6* 36.4*  MCV 97.9 98.6  PLT 84* 70*   Basic Metabolic Panel:  Recent Labs Lab 06/09/16 1602 06/09/16 2218 06/10/16 0239 06/10/16 0455  NA 138  --  139 139  K 3.7  --  3.0* 3.1*  CL  98*  --  97* 98*  CO2 31  --  34* 31  GLUCOSE 129*  --  117* 102*  BUN 20  --  20 18  CREATININE 1.38*  --  1.41* 1.33*  CALCIUM 8.2*  --  8.2* 8.2*  MG  --  2.0 1.9  --    GFR: Estimated Creatinine Clearance: 45 mL/min (A) (by C-G formula based on SCr of 1.33 mg/dL (H)). Liver Function Tests:  Recent Labs Lab 06/09/16 1602  AST 18  ALT 15*  ALKPHOS 59  BILITOT 1.6*  PROT 5.9*  ALBUMIN 3.2*   Coagulation Profile:  Recent Labs Lab 06/06/16 1336 06/09/16 1602 06/10/16 0455  INR 3.3 1.77 1.72   Cardiac Enzymes:  Recent Labs Lab 06/09/16 2218 06/10/16 0239 06/10/16 1016  TROPONINI 0.08* 0.08* 0.08*   Thyroid Function Tests:  Recent Labs  06/09/16 2218  TSH 2.153    Radiology Studies: Dg Chest 2 View  Result Date: 06/09/2016 CLINICAL DATA:  Bilateral leg swelling beginning this  morning. History of diabetes, hypertension, COPD, CHF. EXAM: CHEST  2 VIEW COMPARISON:  Chest radiograph May 30, 2016 FINDINGS: Cardiac silhouette is mildly enlarged and unchanged. Mediastinal silhouette is nonsuspicious, calcified aortic knob. Similar pulmonary vascular congestion, small pleural effusions. Increased lung volumes with flattened hemidiaphragms. No focal consolidation. No pneumothorax. Dual lead LEFT AICD. Osteopenia. Soft tissue planes are nonsuspicious. IMPRESSION: Stable examination: Mild cardiomegaly and pulmonary vascular congestion. COPD with small pleural effusions. Electronically Signed   By: Elon Alas M.D.   On: 06/09/2016 15:55    Scheduled Meds: . alfuzosin  10 mg Oral Q breakfast  . allopurinol  100 mg Oral Daily  . atorvastatin  10 mg Oral QHS  . carvedilol  12.5 mg Oral BID WC  . digoxin  0.0625 mg Oral Daily  . furosemide  40 mg Intravenous Q12H  . guaiFENesin  600 mg Oral BID  . loratadine  10 mg Oral Daily  . losartan  50 mg Oral Daily  . mometasone-formoterol  2 puff Inhalation BID  . tiotropium  18 mcg Inhalation Daily  . warfarin  3 mg Oral ONCE-1800  . Warfarin - Pharmacist Dosing Inpatient   Does not apply q1800   Continuous Infusions:   LOS: 1 day    Chipper Oman, MD Pager: Text Page via [email or web address]  [phone]  If 7PM-7AM, please contact night-coverage [email or web address] Password [email or web address] 06/10/2016, 2:07 PM

## 2016-06-10 NOTE — Progress Notes (Signed)
Pt asking for cough med. Have Robitussin SO. Pt getting 600 mg Mucinex @ 2200. Called pharmacy and spoke to [place]. She advised to give Robitussin now and give 600 mg of Mucinex @ 2300.

## 2016-06-10 NOTE — Progress Notes (Signed)
*  PRELIMINARY RESULTS* Vascular Ultrasound Bilateral lower extremity venous duplex has been completed.  Preliminary findings: No evidence of deep vein thrombosis in the visualized veins of the lower extremities.  Chronic appearing superficial vein thrombosis noted in the greater saphenous veins bilaterally, rt>lt. Negative for baker's cysts bilaterally.   Everrett Coombe 06/10/2016, 3:04 PM

## 2016-06-10 NOTE — Progress Notes (Signed)
ANTICOAGULATION CONSULT NOTE - Follow Up Consult  Pharmacy Consult for warfarin Indication: hx atrial fibrillation  Allergies  Allergen Reactions  . Benadryl [Diphenhydramine Hcl] Nausea And Vomiting    Patient Measurements: Height: 5\' 4"  (162.6 cm) Weight: 175 lb 4.3 oz (79.5 kg) IBW/kg (Calculated) : 59.2 Heparin Dosing Weight:   Vital Signs: Temp: 98.9 F (37.2 C) (05/21 0438) Temp Source: Oral (05/21 0438) BP: 132/57 (05/21 0438) Pulse Rate: 72 (05/21 0438)  Labs:  Recent Labs  06/09/16 1602 06/09/16 2218 06/10/16 0239 06/10/16 0455  HGB 12.5*  --   --  11.8*  HCT 37.6*  --   --  36.4*  PLT 84*  --   --  70*  LABPROT 20.8*  --   --  20.4*  INR 1.77  --   --  1.72  CREATININE 1.38*  --  1.41* 1.33*  TROPONINI  --  0.08* 0.08*  --     Estimated Creatinine Clearance: 45 mL/min (A) (by C-G formula based on SCr of 1.33 mg/dL (H)).   Medications:  Home warfarin regimen: 2 mg daily except 3 mg on Monday and Thursday  Assessment: Patient is a 76 y.o M with hx thrombocytopenia and afib on warfarin PTA, presented to the ED on 06/09/16 with c/o LE swelling and SOB.  Warfarin resumed inpatient.  Today, 06/10/2016: - INR is subtherapeutic at 1.72 - hgb down slightly to 11.8, plt low at 70 (was 120 on 03/10/16) - no bleeding document - no significant drug-drug intxns  Goal of Therapy:  INR 2-3 Monitor platelets by anticoagulation protocol: Yes   Plan:  - warfarin 3 mg PO x1 today - monitor for s/s bleeding  Karolynn Infantino P 06/10/2016,8:54 AM

## 2016-06-10 NOTE — Progress Notes (Signed)
Patient had a 3 beat run of VT. Pt asymptomatic, VSS. MD Hal Hope notified and ordered STAT BMET and Mag level. Will continue to monitor.

## 2016-06-11 ENCOUNTER — Observation Stay (HOSPITAL_COMMUNITY): Payer: Medicare Other

## 2016-06-11 DIAGNOSIS — I130 Hypertensive heart and chronic kidney disease with heart failure and stage 1 through stage 4 chronic kidney disease, or unspecified chronic kidney disease: Secondary | ICD-10-CM | POA: Diagnosis not present

## 2016-06-11 DIAGNOSIS — I5023 Acute on chronic systolic (congestive) heart failure: Secondary | ICD-10-CM | POA: Diagnosis not present

## 2016-06-11 LAB — BASIC METABOLIC PANEL
ANION GAP: 8 (ref 5–15)
BUN: 18 mg/dL (ref 6–20)
CALCIUM: 8.1 mg/dL — AB (ref 8.9–10.3)
CO2: 34 mmol/L — ABNORMAL HIGH (ref 22–32)
CREATININE: 1.5 mg/dL — AB (ref 0.61–1.24)
Chloride: 97 mmol/L — ABNORMAL LOW (ref 101–111)
GFR calc non Af Amer: 43 mL/min — ABNORMAL LOW (ref >60)
GFR, EST AFRICAN AMERICAN: 50 mL/min — AB (ref >60)
Glucose, Bld: 94 mg/dL (ref 65–99)
Potassium: 3.3 mmol/L — ABNORMAL LOW (ref 3.5–5.1)
Sodium: 139 mmol/L (ref 135–145)

## 2016-06-11 LAB — PROTIME-INR
INR: 1.85
PROTHROMBIN TIME: 21.6 s — AB (ref 11.4–15.2)

## 2016-06-11 MED ORDER — FUROSEMIDE 40 MG PO TABS: 60.0000 mg | ORAL_TABLET | Freq: Two times a day (BID) | ORAL | 0 refills | Status: DC

## 2016-06-11 MED ORDER — WARFARIN SODIUM 3 MG PO TABS
3.0000 mg | ORAL_TABLET | Freq: Once | ORAL | Status: AC
  Administered 2016-06-11: 3 mg via ORAL
  Filled 2016-06-11: qty 1

## 2016-06-11 MED ORDER — POTASSIUM CHLORIDE CRYS ER 20 MEQ PO TBCR
40.0000 meq | EXTENDED_RELEASE_TABLET | ORAL | Status: AC
  Administered 2016-06-11 (×2): 40 meq via ORAL
  Filled 2016-06-11 (×2): qty 2
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
## 2016-06-11 NOTE — Care Management CC44 (Signed)
Condition Code 44 Documentation Completed  Patient Details  Name: TRYSTON GILLIAM MRN: [id number] Date of Birth: 1940-12-02   Condition Code 44 given:  Yes Patient signature on Condition Code 44 notice:  Yes Documentation of 2 MD's agreement:  Yes Code 44 added to claim:  Yes    Purcell Mouton, RN 06/11/2016, 11:31 AM

## 2016-06-11 NOTE — Plan of Care (Signed)
Problem: Skin Integrity: Goal: Risk for impaired skin integrity will decrease Outcome: Progressing Pt's Bi LE edema has improved.

## 2016-06-11 NOTE — Discharge Summary (Signed)
Physician Discharge Summary  Troy [name]  ZES:[id number]  DOB: October 09, 1940  DOA: 06/09/2016 PCP: Elwyn Reach, MD  Admit date: 06/09/2016 Discharge date: 06/11/2016  Admitted From: Home  Disposition:  Home   Recommendations for Outpatient Follow-up:  1. Follow up with PCP in 1 week 2. Please obtain BMP/CBC in one week to monitor Cr, K and Hgb 3. Follow up with cardiology in 1-2 weeks   Home Health: PT/RN and aid  Equipment/Devices: O2 2L [place]  Discharge Condition: Stable  CODE STATUS: FULL  Diet recommendation: Heart Healthy fluid restriction to 1500 mL per day    Brief/Interim Summary: 76 year old male with medical history CHF, paroxysmal A. fib, COPD, chronic anemia and chronic kidney disease presented to the emergency department complaining of increasing lower extremity edema and shortness of breath. In the ED patient was found to have x-ray with increase in congestion and pleural effusion. He was admitted for IV diuresis, was started on Lasix 40 mg IV BID. Patient demonstrated good diuresis negative - 3.5L and 3 Lb drop on weight. Patient breathing has return back to baseline. Patient was ambulated to check O2 requirement for which he did drop his saturation to 86% when ambulating on room air. Patient will be discharge on Lasix 60 mg BID, and O2 supplementation when ambulating.   Subjective: Patient seen and examined with rounding team. Patient only c/o of cough, breathing back to baseline. Patient denies chest pain, SOB and palpitations.   Discharge Diagnoses/Hospital Course:  Acute on chronic systolic heart failure - EF in 02/2016 30-35% s/p AICD placement  BNP >1000, + LE edema and SOB  Treated with IV lasix 40 mg BID, good UOP and diuresis. Transitioned to PO lasix 60 mg BID upon discharge Patient was continued on Coreg, digoxin and Losartan  Patient was advised on low salt diet, fluid restriction and to monitor weight daily  [facility] were placed which showed a  significant improvement on leg edema  Patient advised to follow up with PCP in 1 week  CKD stage III - Slight increased upon discharge baseline 1.3-14, current 1.50 this is likely due to IV lasix. As patient was transitioned to PO lasix expect Cr to return to baseline. Patient is to follow up with PCP in 1 week to check Cr levels.   COPD - no actively wheezing  No changes in medications were made  Mucinex given for cough   PAF - rate and rhythm well controlled  No changes in medications were made   Anemia of chronic diseases - Hgb stable   All other chronic medical condition were stable during the hospitalization.  On the day of the discharge the patient's vitals were stable, and no other acute medical condition were reported by patient. Patient was felt safe to be discharge to home  Discharge Instructions  You were cared for by a hospitalist during your hospital stay. If you have any questions about your discharge medications or the care you received while you were in the hospital after you are discharged, you can call the unit and asked to speak with the hospitalist on call if the hospitalist that took care of you is not available. Once you are discharged, your primary care physician will handle any further medical issues. Please note that NO REFILLS for any discharge medications will be authorized once you are discharged, as it is imperative that you return to your primary care physician (or establish a relationship with a primary care physician if you do not  have one) for your aftercare needs so that they can reassess your need for medications and monitor your lab values.  Discharge Instructions    Call MD for:  difficulty breathing, headache or visual disturbances    Complete by:  As directed    Call MD for:  extreme fatigue    Complete by:  As directed    Call MD for:  hives    Complete by:  As directed    Call MD for:  persistant dizziness or light-headedness    Complete by:   As directed    Call MD for:  persistant nausea and vomiting    Complete by:  As directed    Call MD for:  redness, tenderness, or signs of infection (pain, swelling, redness, odor or green/yellow discharge around incision site)    Complete by:  As directed    Call MD for:  severe uncontrolled pain    Complete by:  As directed    Call MD for:  temperature >100.4    Complete by:  As directed    Diet - low sodium heart healthy    Complete by:  As directed    For home use only DME oxygen    Complete by:  As directed    Mode or (Route):  Mask   Liters per Minute:  2   Frequency:  Continuous (stationary and portable oxygen unit needed)   Oxygen delivery system:  Gas   Increase activity slowly    Complete by:  As directed      Allergies as of 06/11/2016      Reactions   Benadryl [diphenhydramine Hcl] Nausea And Vomiting      Medication List    TAKE these medications   AEROCHAMBER MV inhaler Use as instructed   alfuzosin 10 MG 24 hr tablet Commonly known as:  UROXATRAL Take 10 mg by mouth daily with breakfast.   allopurinol 100 MG tablet Commonly known as:  ZYLOPRIM Take 100 mg by mouth daily.   atorvastatin 10 MG tablet Commonly known as:  LIPITOR Take 1 tablet (10 mg total) by mouth at bedtime.   carvedilol 12.5 MG tablet Commonly known as:  COREG Take 1 tablet (12.5 mg total) by mouth 2 (two) times daily with a meal.   cetirizine 10 MG tablet Commonly known as:  ZYRTEC Take 10 mg by mouth daily.   digoxin 0.125 MG tablet Commonly known as:  LANOXIN Take 0.5 tablets (0.0625 mg total) by mouth daily.   fluticasone 50 MCG/ACT nasal spray Commonly known as:  FLONASE Place 2 sprays into both nostrils at bedtime as needed for allergies.   furosemide 40 MG tablet Commonly known as:  LASIX Take 1.5 tablets (60 mg total) by mouth 2 (two) times daily. What changed:  how much to take  Another medication with the same name was removed. Continue taking this  medication, and follow the directions you see here.   guaiFENesin 600 MG 12 hr tablet Commonly known as:  MUCINEX Take 1 tablet (600 mg total) by mouth 2 (two) times daily.   losartan 50 MG tablet Commonly known as:  COZAAR Take 1 tablet (50 mg total) by mouth daily.   mometasone-formoterol 100-5 MCG/ACT Aero Commonly known as:  DULERA Inhale 2 puffs into the lungs 2 (two) times daily.   potassium chloride 10 MEQ tablet Commonly known as:  K-DUR Take 1 tablet (10 mEq total) by mouth 2 (two) times daily.   PROAIR HFA 108 (90 Base)  MCG/ACT inhaler Generic drug:  albuterol INHALE 2 PUFFS EVERY 4 HOURS AS NEEDED FOR WHEEZING OR SHORTNESS OF BREATH.   albuterol 1.25 MG/3ML nebulizer solution Commonly known as:  ACCUNEB Take 3 mLs by nebulization 4 (four) times daily.   tiotropium 18 MCG inhalation capsule Commonly known as:  SPIRIVA Place 1 capsule (18 mcg total) into inhaler and inhale daily.   warfarin 1 MG tablet Commonly known as:  COUMADIN Take 2 tabs daily except 3 tabs on Wednesday & Saturday or as directed by [facility] What changed:  additional instructions            Durable Medical Equipment        Start     Ordered   06/11/16 0000  For home use only DME oxygen    Question Answer Comment  Mode or (Route) Mask   Liters per Minute 2   Frequency Continuous (stationary and portable oxygen unit needed)   Oxygen delivery system Gas      06/11/16 1532      Allergies  Allergen Reactions  . Benadryl [Diphenhydramine Hcl] Nausea And Vomiting    Consultations:  None    Procedures/Studies: Dg Chest 2 View  Result Date: 06/09/2016 CLINICAL DATA:  Bilateral leg swelling beginning this morning. History of diabetes, hypertension, COPD, CHF. EXAM: CHEST  2 VIEW COMPARISON:  Chest radiograph May 30, 2016 FINDINGS: Cardiac silhouette is mildly enlarged and unchanged. Mediastinal silhouette is nonsuspicious, calcified aortic knob. Similar pulmonary vascular  congestion, small pleural effusions. Increased lung volumes with flattened hemidiaphragms. No focal consolidation. No pneumothorax. Dual lead LEFT AICD. Osteopenia. Soft tissue planes are nonsuspicious. IMPRESSION: Stable examination: Mild cardiomegaly and pulmonary vascular congestion. COPD with small pleural effusions. Electronically Signed   By: Elon Alas M.D.   On: 06/09/2016 15:55   Dg Chest 2 View  Result Date: 05/14/2016 CLINICAL DATA:  Cough this morning EXAM: CHEST  2 VIEW COMPARISON:  04/16/2016 FINDINGS: Cardiac shadow remains enlarged. A defibrillator is again seen and stable. Mild vascular prominence is again identified. Chronic blunting of costophrenic angles is seen. Mild scarring is noted in the bases bilaterally. IMPRESSION: Chronic changes in the bases. Mild vascular congestion stable from the prior exam. No acute abnormality is noted. Electronically Signed   By: Inez Catalina M.D.   On: 05/14/2016 12:23   Dg Chest Port 1 View  Result Date: 06/11/2016 CLINICAL DATA:  CHF EXAM: PORTABLE CHEST 1 VIEW COMPARISON:  Two days prior FINDINGS: Stable cardiomegaly. Biventricular ICD/ pacer. Increased hazy density at the bases. Lungs are chronically hyperinflated. Possible trace effusions. No pneumothorax. IMPRESSION: Increased hazy density at the bases may reflect increased edema. Pneumonia would be considered in the appropriate clinical setting. Electronically Signed   By: Monte Fantasia M.D.   On: 06/11/2016 07:01      Discharge Exam: Vitals:   06/11/16 0544 06/11/16 0810  BP: 136/65   Pulse: 66 65  Resp: 18   Temp: 97.6 F (36.4 C)    Vitals:   06/10/16 2038 06/11/16 0544 06/11/16 0807 06/11/16 0810  BP: (!) 129/48 136/65    Pulse: 67 66  65  Resp: 18 18    Temp: 98.1 F (36.7 C) 97.6 F (36.4 C)    TempSrc: Oral Oral    SpO2: 100% 96% 98%   Weight:  78.1 kg (172 lb 2.9 oz)    Height:        General: Pt is alert, awake, not in acute distress Cardiovascular:  RRR,  S1/S2 +, no rubs, no gallops Respiratory: CTA bilaterally, no wheezing, no rhonchi Abdominal: Soft, NT, ND, bowel sounds + Extremities: no edema, no cyanosis   The results of significant diagnostics from this hospitalization (including imaging, microbiology, ancillary and laboratory) are listed below for reference.     Microbiology: No results found for this or any previous visit (from the past 240 hour(s)).   Labs: BNP (last 3 results)  Recent Labs  03/10/16 0604 05/14/16 1303 06/09/16 1602  BNP 620.0* 1,534.0* 2,423.5*   Basic Metabolic Panel:  Recent Labs Lab 06/09/16 1602 06/09/16 2218 06/10/16 0239 06/10/16 0455 06/11/16 0456  NA 138  --  139 139 139  K 3.7  --  3.0* 3.1* 3.3*  CL 98*  --  97* 98* 97*  CO2 31  --  34* 31 34*  GLUCOSE 129*  --  117* 102* 94  BUN 20  --  20 18 18   CREATININE 1.38*  --  1.41* 1.33* 1.50*  CALCIUM 8.2*  --  8.2* 8.2* 8.1*  MG  --  2.0 1.9  --   --    Liver Function Tests:  Recent Labs Lab 06/09/16 1602  AST 18  ALT 15*  ALKPHOS 59  BILITOT 1.6*  PROT 5.9*  ALBUMIN 3.2*   No results for input(s): LIPASE, AMYLASE in the last 168 hours. No results for input(s): AMMONIA in the last 168 hours. CBC:  Recent Labs Lab 06/09/16 1602 06/10/16 0455  WBC 7.9 7.6  NEUTROABS 5.7  --   HGB 12.5* 11.8*  HCT 37.6* 36.4*  MCV 97.9 98.6  PLT 84* 70*   Cardiac Enzymes:  Recent Labs Lab 06/09/16 2218 06/10/16 0239 06/10/16 1016 06/10/16 1546  TROPONINI 0.08* 0.08* 0.08* 0.05*   Thyroid function studies  Recent Labs  06/09/16 2218  TSH 2.153   Anemia work up No results for input(s): VITAMINB12, FOLATE, FERRITIN, TIBC, IRON, RETICCTPCT in the last 72 hours. Urinalysis    Component Value Date/Time   COLORURINE YELLOW 06/09/2016 [place] 06/09/2016 1539   LABSPEC 1.011 06/09/2016 1539   PHURINE 7.0 06/09/2016 1539   GLUCOSEU NEGATIVE 06/09/2016 1539   HGBUR SMALL (A) 06/09/2016 1539    BILIRUBINUR NEGATIVE 06/09/2016 1539   KETONESUR NEGATIVE 06/09/2016 1539   PROTEINUR NEGATIVE 06/09/2016 1539   UROBILINOGEN 0.2 11/21/2013 1946   NITRITE NEGATIVE 06/09/2016 1539   LEUKOCYTESUR NEGATIVE 06/09/2016 1539   Sepsis Labs Invalid input(s): PROCALCITONIN,  WBC,  LACTICIDVEN Microbiology No results found for this or any previous visit (from the past 240 hour(s)).   Time coordinating discharge: Over 30 minutes  SIGNED:  Chipper Oman, MD  [facility] 06/11/2016, 3:32 PM  Pager please text page via  [email or web address] Password [email or web address]

## 2016-06-11 NOTE — Progress Notes (Signed)
Pt has no ride at this time.

## 2016-06-11 NOTE — Progress Notes (Signed)
SATURATION QUALIFICATIONS: (This note is used to comply with regulatory documentation for home oxygen)  Patient Saturations on Room Air at Rest = 90%  Patient Saturations on Room Air while Ambulating = 86-88%  Patient Saturations on 0 Liters of oxygen while Ambulating = 86-88%  Please briefly explain why patient needs home oxygen:

## 2016-06-11 NOTE — Care Management Obs Status (Signed)
[place] NOTIFICATION   Patient Details  Name: Troy [name] MRN: [id number] Date of Birth: [birth year]   Medicare Observation Status Notification Given:  Yes    Purcell Mouton, RN 06/11/2016, 11:31 AM

## 2016-06-11 NOTE — Care Management Note (Signed)
Case Management Note  Patient Details  Name: KERRON SEDANO MRN: [id number] Date of Birth: 1940/04/18  Subjective/Objective:   Pt admitted with CHF                 Action/Plan: Plan to discharge home with [facility]. Pt is on home O2.   Expected Discharge Date:  06/11/16               Expected Discharge Plan:  [place]  In-House Referral:     Discharge planning Services  CM Consult  Post Acute Care Choice:  Home Health Choice offered to:  Patient  DME Arranged:  Oxygen DME Agency:  [place]:  RN, Social Work [employer] Agency:  [place]  Status of Service:  Completed, signed off  If discussed at [employer], dates discussed:    Additional CommentsPurcell Mouton, RN 06/11/2016, 3:36 PM

## 2016-06-11 NOTE — Progress Notes (Signed)
D/c to home w/ friend via w/c and O2 but pt does not want to wear o2 unless he needs it.O2 sats at this time 95% on [place]

## 2016-06-11 NOTE — Progress Notes (Signed)
Pt discharged to home. CSW was at bedside with this CM to inquire about transportation home for the pt. The Pt live in [place], [place]. His friend or son is coming to pick him up. Pt is on home O2 and states that he will not need O2 to go home, because he did not need it coming up here. O2 was ordered from [place] anyway. O2 was delivered and pt took O2 tank after [employer] rep talked to him.

## 2016-06-11 NOTE — Progress Notes (Signed)
ANTICOAGULATION CONSULT NOTE - Follow Up Consult  Pharmacy Consult for warfarin Indication: hx atrial fibrillation  Allergies  Allergen Reactions  . Benadryl [Diphenhydramine Hcl] Nausea And Vomiting    Patient Measurements: Height: 5\' 4"  (162.6 cm) Weight: 172 lb 2.9 oz (78.1 kg) IBW/kg (Calculated) : 59.2 Heparin Dosing Weight:   Vital Signs: Temp: 97.6 F (36.4 C) (05/22 0544) Temp Source: Oral (05/22 0544) BP: 136/65 (05/22 0544) Pulse Rate: 66 (05/22 0544)  Labs:  Recent Labs  06/09/16 1602  06/10/16 0239 06/10/16 0455 06/10/16 1016 06/10/16 1546 06/11/16 0456  HGB 12.5*  --   --  11.8*  --   --   --   HCT 37.6*  --   --  36.4*  --   --   --   PLT 84*  --   --  70*  --   --   --   LABPROT 20.8*  --   --  20.4*  --   --  21.6*  INR 1.77  --   --  1.72  --   --  1.85  CREATININE 1.38*  --  1.41* 1.33*  --   --  1.50*  TROPONINI  --   < > 0.08*  --  0.08* 0.05*  --   < > = values in this interval not displayed.  Estimated Creatinine Clearance: 39.6 mL/min (A) (by C-G formula based on SCr of 1.5 mg/dL (H)).   Medications:  Home warfarin regimen: 2 mg daily except 3 mg on Monday and Thursday  Assessment: Patient is a 76 y.o M with hx thrombocytopenia and afib on warfarin PTA, presented to the ED on 06/09/16 with c/o LE swelling and SOB.  Warfarin resumed inpatient.  Significant events: 5/21 LE doppler: neg for DVT; chronic superficial vein thrombosis                            Warfarin dose on 5/21 not charted  Today, 06/11/2016: - INR is subtherapeutic but increased to 1.85; of note, warfarin dose not charted on 5/21 - hgb down slightly to 11.8, plt low at 70 (was 120 on 03/10/16) - no bleeding document - no significant drug-drug intxns  Goal of Therapy:  INR 2-3 Monitor platelets by anticoagulation protocol: Yes   Plan:  - repeat warfarin 3 mg PO x1 today - monitor for s/s bleeding  Denaya Horn P 06/11/2016,7:20 AM

## 2016-06-12 ENCOUNTER — Ambulatory Visit: Payer: Medicare Other | Admitting: Emergency Medicine

## 2016-06-13 ENCOUNTER — Ambulatory Visit: Payer: Medicare Other | Admitting: Interventional Cardiology

## 2016-06-18 ENCOUNTER — Telehealth: Payer: Self-pay | Admitting: Emergency Medicine

## 2016-06-18 NOTE — Telephone Encounter (Signed)
Spoke with pt, scheduled for an acute visit to see VS on Thursday afternoon- pt c/o low O2 sats, sob.  Nothing further needed at this time.

## 2016-06-20 ENCOUNTER — Ambulatory Visit: Payer: Medicare Other | Admitting: Pulmonary Disease

## 2016-06-24 NOTE — ED Provider Notes (Signed)
[facility] DEPT Provider Note   CSN: [id number] Arrival date & time: 06/09/16  1434     History   Chief Complaint Chief Complaint  Patient presents with  . Leg Swelling  . Cough    HPI Troy [name] is a 76 y.o. male.  HPI Patient presents to the emergency department with increasing shortness of breath over the last few weeks.  He states he has had bilateral leg swelling is worsened over the last few months.  He states that he has had changes in his Lasix still feels like he is retaining too much fluid.  The patient states that he has had some cough as well.  Patient states that nothing seems make the condition better.  He states that exertion does seem to make his shortness of breath, somewhat worse date that he has seen his doctor whose adjusted the medication several times, but does not feel that it is working. The patient denies chest pain,, headache,blurred vision, neck pain, fever,  weakness, numbness, dizziness, anorexia, edema, abdominal pain, nausea, vomiting, diarrhea, rash, back pain, dysuria, hematemesis, bloody stool, near syncope, or syncope. Past Medical History:  Diagnosis Date  . Atrial fibrillation ([place])   . CAD (coronary artery disease) 1996   status post PCI of the RCA   . Congestive heart failure, unspecified   . COPD (chronic obstructive pulmonary disease) (HCC)    Pt on home O2 at night and PRN, unsure of date of diagnosis.  . Diabetes mellitus, type 2 ([place])   . DJD (degenerative joint disease)   . GERD (gastroesophageal reflux disease)   . Gout   . HTN (hypertension)   . 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 Nephrolithiasis   . Other primary cardiomyopathies   . Personal history of DVT (deep vein thrombosis)   . Prostatitis   . Shingles     Patient Active Problem List   Diagnosis Date Noted  . Acute on chronic systolic CHF (congestive heart failure) ([place]) 06/09/2016  . CHF (congestive heart failure) ([place]) 06/09/2016  . Acute on chronic  respiratory failure with hypoxia and hypercapnia ([place]) 03/08/2016  . Lower GI bleed 09/11/2015  . Bright red blood per rectum 09/11/2015  . Community acquired pneumonia 08/24/2015  . Acute exacerbation of chronic obstructive pulmonary disease (COPD) ([place]) 03/05/2015  . Acute renal failure superimposed on stage 3 chronic kidney disease ([place]) 03/05/2015  . Renal failure (ARF), acute on chronic (HCC)   . Stomach ache 01/18/2015  . AKI (acute kidney injury) ([place]) 12/26/2014  . Acute exacerbation of CHF (congestive heart failure) ([place]) 12/25/2014  . BPH (benign prostatic hyperplasia) 12/25/2014  . FTT (failure to thrive) in adult 12/25/2014  . Encounter for therapeutic drug monitoring 08/05/2014  . Pulmonary nodules 12/23/2013  . Hemoptysis   . Dyspnea   . Chronic kidney disease, stage 3   . Coronary artery disease involving native coronary artery of native heart without angina pectoris   . Paroxysmal atrial fibrillation (HCC)   . Frequent PVCs   . Hypoxia   . Acute on chronic respiratory failure with hypoxia ([place])   . Chronic obstructive pulmonary disease with acute exacerbation ([place])   . COPD (chronic obstructive pulmonary disease) ([place]) 06/16/2013  . Lipoma of neck 09/11/2011  . 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 Coronary artery disease 01/30/2011  . Chronic atrial fibrillation ([place]) 01/30/2011  . Chronic systolic CHF (congestive heart failure) ([place])   . Other primary cardiomyopathies   . Diverticulosis of colon  with hemorrhage 08/28/2010    Past Surgical History:  Procedure Laterality Date  . BACK SURGERY    . CARDIAC DEFIBRILLATOR PLACEMENT    . CORONARY STENT PLACEMENT    . PACEMAKER INSERTION         Home Medications    Prior to Admission medications   Medication Sig Start Date End Date Taking? Authorizing Provider  albuterol (ACCUNEB) 1.25 MG/3ML nebulizer solution Take 3 mLs by nebulization 4 (four) times daily. 08/21/15  Yes  [provider]  alfuzosin (UROXATRAL) 10 MG 24 hr tablet Take 10 mg by mouth daily with breakfast.    Yes [provider]  allopurinol (ZYLOPRIM) 100 MG tablet Take 100 mg by mouth daily.   Yes [provider]  atorvastatin (LIPITOR) 10 MG tablet Take 1 tablet (10 mg total) by mouth at bedtime. 08/03/14  Yes Jettie Booze, MD  carvedilol (COREG) 12.5 MG tablet Take 1 tablet (12.5 mg total) by mouth 2 (two) times daily with a meal. 03/03/15  Yes Jettie Booze, MD  cetirizine (ZYRTEC) 10 MG tablet Take 10 mg by mouth daily.   Yes [provider]  digoxin (LANOXIN) 0.125 MG tablet Take 0.5 tablets (0.0625 mg total) by mouth daily. 06/23/15  Yes Jettie Booze, MD  fluticasone [facility]) 50 MCG/ACT nasal spray Place 2 sprays into both nostrils at bedtime as needed for allergies.  04/07/13  Yes [provider]  guaiFENesin (MUCINEX) 600 MG 12 hr tablet Take 1 tablet (600 mg total) by mouth 2 (two) times daily. 03/10/16  Yes Kathie Dike, MD  losartan (COZAAR) 50 MG tablet Take 1 tablet (50 mg total) by mouth daily. 05/03/16 08/01/16 Yes Jettie Booze, MD  mometasone-formoterol [facility]) 100-5 MCG/ACT AERO Inhale 2 puffs into the lungs 2 (two) times daily. 09/13/15  Yes Vann, Jessica U, DO  PROAIR HFA 108 (90 BASE) MCG/ACT inhaler INHALE 2 PUFFS EVERY 4 HOURS AS NEEDED FOR WHEEZING OR SHORTNESS OF BREATH. 09/05/14  Yes Byrum, Rose Fillers, MD  Spacer/Aero-Holding Chambers (AEROCHAMBER MV) inhaler Use as instructed 03/12/16  Yes Collene Gobble, MD  tiotropium (SPIRIVA) 18 MCG inhalation capsule Place 1 capsule (18 mcg total) into inhaler and inhale daily. 03/11/16  Yes Kathie Dike, MD  warfarin (COUMADIN) 1 MG tablet Take 2 tabs daily except 3 tabs on Wednesday & Saturday or as directed by [facility] Patient taking differently: Take 2mg  by mouth on Tues, Wed, Fri, Sat & Sun. Take 3mg  by mouth on Monday andThursday as directed by clinic.  09/13/15  Yes Vann, Jessica U, DO  furosemide (LASIX) 40 MG tablet Take 1.5 tablets (60 mg total) by mouth 2 (two) times daily. 06/11/16   Doreatha Lew, MD  potassium chloride (K-DUR) 10 MEQ tablet Take 1 tablet (10 mEq total) by mouth 2 (two) times daily. Patient not taking: Reported on 06/09/2016 03/11/16   Jettie Booze, MD    Family History Family History  Problem Relation Age of Onset  . Heart disease Father   . Cancer Father        LUNG  . Colon cancer Mother   . Stroke Paternal Uncle   . Heart attack Neg Hx   . Hyperlipidemia Neg Hx   . Hypertension Neg Hx     Social History Social History  Substance Use Topics  . Smoking status: Former Smoker    Packs/day: 4.00    Years: 50.00    Types: Cigarettes    Quit date: 01/22/1980  .  Smokeless tobacco: Never Used  . Alcohol use No     Comment: denies     Allergies   Benadryl [diphenhydramine hcl]   Review of Systems Review of Systems All other systems negative except as documented in the HPI. All pertinent positives and negatives as reviewed in the HPI.  Physical Exam Updated Vital Signs BP (!) 141/56   Pulse 66   Temp 97.6 F (36.4 C) (Oral)   Resp 18   Ht 5\' 4"  (1.626 m)   Wt 78.1 kg (172 lb 2.9 oz)   SpO2 98%   BMI 29.55 kg/m   Physical Exam  Constitutional: He is oriented to person, place, and time. He appears well-developed and well-nourished. No distress.  HENT:  Head: Normocephalic and atraumatic.  Mouth/Throat: Oropharynx is clear and moist.  Eyes: Pupils are equal, round, and reactive to light.  Neck: Normal range of motion. Neck supple.  Cardiovascular: Normal rate, regular rhythm and normal heart sounds.  Exam reveals no gallop and no friction rub.   No murmur heard. Pulmonary/Chest: Effort normal and breath sounds normal. No respiratory distress. He has no wheezes.  Abdominal: Soft. Bowel sounds are normal. He exhibits no distension. There is no tenderness.  Musculoskeletal: He  exhibits edema.  Neurological: He is alert and oriented to person, place, and time. He exhibits normal muscle tone. Coordination normal.  Skin: Skin is warm and dry. Capillary refill takes less than 2 seconds. No rash noted. No erythema.  Psychiatric: He has a normal mood and affect. His behavior is normal.  Nursing note and vitals reviewed.    ED Treatments / Results  Labs (all labs ordered are listed, but only abnormal results are displayed) Labs Reviewed  CBC WITH DIFFERENTIAL/PLATELET - Abnormal; Notable for the following:       Result Value   RBC 3.84 (*)    Hemoglobin 12.5 (*)    HCT 37.6 (*)    RDW 16.3 (*)    Platelets 84 (*)    All other components within normal limits  COMPREHENSIVE METABOLIC PANEL - Abnormal; Notable for the following:    Chloride 98 (*)    Glucose, Bld 129 (*)    Creatinine, Ser 1.38 (*)    Calcium 8.2 (*)    Total Protein 5.9 (*)    Albumin 3.2 (*)    ALT 15 (*)    Total Bilirubin 1.6 (*)    GFR calc non Af Amer 48 (*)    GFR calc Af Amer 56 (*)    All other components within normal limits  BRAIN NATRIURETIC PEPTIDE - Abnormal; Notable for the following:    B Natriuretic Peptide 1,356.7 (*)    All other components within normal limits  URINALYSIS, ROUTINE W REFLEX MICROSCOPIC - Abnormal; Notable for the following:    Hgb urine dipstick SMALL (*)    All other components within normal limits  PROTIME-INR - Abnormal; Notable for the following:    Prothrombin Time 20.8 (*)    All other components within normal limits  DIGOXIN LEVEL - Abnormal; Notable for the following:    Digoxin Level 0.4 (*)    All other components within normal limits  TROPONIN I - Abnormal; Notable for the following:    Troponin I 0.08 (*)    All other components within normal limits  BASIC METABOLIC PANEL - Abnormal; Notable for the following:    Potassium 3.1 (*)    Chloride 98 (*)    Glucose, Bld 102 (*)  Creatinine, Ser 1.33 (*)    Calcium 8.2 (*)    GFR calc  non Af Amer 50 (*)    GFR calc Af Amer 58 (*)    All other components within normal limits  CBC - Abnormal; Notable for the following:    RBC 3.69 (*)    Hemoglobin 11.8 (*)    HCT 36.4 (*)    RDW 16.1 (*)    Platelets 70 (*)    All other components within normal limits  PROTIME-INR - Abnormal; Notable for the following:    Prothrombin Time 20.4 (*)    All other components within normal limits  TROPONIN I - Abnormal; Notable for the following:    Troponin I 0.08 (*)    All other components within normal limits  TROPONIN I - Abnormal; Notable for the following:    Troponin I 0.08 (*)    All other components within normal limits  TROPONIN I - Abnormal; Notable for the following:    Troponin I 0.05 (*)    All other components within normal limits  BASIC METABOLIC PANEL - Abnormal; Notable for the following:    Potassium 3.0 (*)    Chloride 97 (*)    CO2 34 (*)    Glucose, Bld 117 (*)    Creatinine, Ser 1.41 (*)    Calcium 8.2 (*)    GFR calc non Af Amer 47 (*)    GFR calc Af Amer 54 (*)    All other components within normal limits  BASIC METABOLIC PANEL - Abnormal; Notable for the following:    Potassium 3.3 (*)    Chloride 97 (*)    CO2 34 (*)    Creatinine, Ser 1.50 (*)    Calcium 8.1 (*)    GFR calc non Af Amer 43 (*)    GFR calc Af Amer 50 (*)    All other components within normal limits  PROTIME-INR - Abnormal; Notable for the following:    Prothrombin Time 21.6 (*)    All other components within normal limits  TSH  MAGNESIUM  MAGNESIUM    EKG  EKG Interpretation  Date/Time:  Sunday Jun 09 2016 15:42:23 EDT Ventricular Rate:  74 PR Interval:    QRS Duration: 130 QT Interval:  416 QTC Calculation: 446 R Axis:   -154 Text Interpretation:  A-V dual-paced complexes w/ some inhibition No further analysis attempted due to paced rhythm similar to prior EKG  Confirmed by LIU MD, DANA ([id number]) on 06/09/2016 6:37:07 PM       Radiology No results  found.  Procedures Procedures (including critical care time)  Medications Ordered in ED Medications  furosemide (LASIX) injection 80 mg (80 mg Intravenous Given 06/09/16 1820)  warfarin (COUMADIN) tablet 3 mg (3 mg Oral Given 06/09/16 2258)  potassium chloride SA (K-DUR,KLOR-CON) CR tablet 40 mEq (40 mEq Oral Given 06/10/16 0637)  potassium chloride SA (K-DUR,KLOR-CON) CR tablet 40 mEq (40 mEq Oral Given 06/11/16 1347)  warfarin (COUMADIN) tablet 3 mg (3 mg Oral Given 06/11/16 1653)     Initial Impression / Assessment and Plan / ED Course  I have reviewed the triage vital signs and the nursing notes.  Pertinent labs & imaging results that were available during my care of the patient were reviewed by me and considered in my medical decision making (see chart for details).     Patient was ambulated and desaturated to 82% patient needed admission to the hospital for further evaluation and care.  I advised the patient of the plan and all questions were answered.  Patient is most likely expecting a CHF exacerbation.  He is given some Lasix here in the emergency department as well  Final Clinical Impressions(s) / ED Diagnoses   Final diagnoses:  Hypoxia  Leg edema  Cough    New Prescriptions Discharge Medication List as of 06/11/2016  3:43 PM       Dalia Heading, PA-C 06/24/16 1129    Forde Dandy, MD 06/24/16 1350

## 2016-06-27 ENCOUNTER — Ambulatory Visit (INDEPENDENT_AMBULATORY_CARE_PROVIDER_SITE_OTHER): Payer: Medicare Other | Admitting: *Deleted

## 2016-06-27 DIAGNOSIS — Z5181 Encounter for therapeutic drug level monitoring: Secondary | ICD-10-CM | POA: Diagnosis not present

## 2016-06-27 DIAGNOSIS — I82409 Acute embolism and thrombosis of unspecified deep veins of unspecified lower extremity: Secondary | ICD-10-CM

## 2016-06-27 DIAGNOSIS — I482 Chronic atrial fibrillation, unspecified: Secondary | ICD-10-CM

## 2016-06-27 DIAGNOSIS — I480 Paroxysmal atrial fibrillation: Secondary | ICD-10-CM

## 2016-06-27 LAB — POCT INR: INR: 3.2

## 2016-07-18 ENCOUNTER — Ambulatory Visit (INDEPENDENT_AMBULATORY_CARE_PROVIDER_SITE_OTHER): Payer: Medicare Other | Admitting: *Deleted

## 2016-07-18 DIAGNOSIS — I482 Chronic atrial fibrillation, unspecified: Secondary | ICD-10-CM

## 2016-07-18 DIAGNOSIS — I480 Paroxysmal atrial fibrillation: Secondary | ICD-10-CM | POA: Diagnosis not present

## 2016-07-18 DIAGNOSIS — Z5181 Encounter for therapeutic drug level monitoring: Secondary | ICD-10-CM | POA: Diagnosis not present

## 2016-07-18 DIAGNOSIS — I82409 Acute embolism and thrombosis of unspecified deep veins of unspecified lower extremity: Secondary | ICD-10-CM | POA: Diagnosis not present

## 2016-07-18 LAB — POCT INR: INR: 2.6

## 2016-08-14 ENCOUNTER — Emergency Department (HOSPITAL_COMMUNITY)
Admission: EM | Admit: 2016-08-14 | Discharge: 2016-08-14 | Disposition: A | Discharge status: Home / Self Care | Payer: Medicare Other | Attending: Emergency Medicine | Admitting: Emergency Medicine

## 2016-08-14 ENCOUNTER — Emergency Department (HOSPITAL_COMMUNITY): Payer: Medicare Other

## 2016-08-14 DIAGNOSIS — Z7901 Long term (current) use of anticoagulants: Secondary | ICD-10-CM | POA: Insufficient documentation

## 2016-08-14 DIAGNOSIS — Z87891 Personal history of nicotine dependence: Secondary | ICD-10-CM | POA: Diagnosis not present

## 2016-08-14 DIAGNOSIS — I4891 Unspecified atrial fibrillation: Secondary | ICD-10-CM | POA: Diagnosis not present

## 2016-08-14 DIAGNOSIS — Z79899 Other long term (current) drug therapy: Secondary | ICD-10-CM | POA: Diagnosis not present

## 2016-08-14 DIAGNOSIS — I110 Hypertensive heart disease with heart failure: Secondary | ICD-10-CM | POA: Diagnosis not present

## 2016-08-14 DIAGNOSIS — I5023 Acute on chronic systolic (congestive) heart failure: Secondary | ICD-10-CM | POA: Insufficient documentation

## 2016-08-14 DIAGNOSIS — E860 Dehydration: Secondary | ICD-10-CM | POA: Insufficient documentation

## 2016-08-14 DIAGNOSIS — J449 Chronic obstructive pulmonary disease, unspecified: Secondary | ICD-10-CM | POA: Insufficient documentation

## 2016-08-14 DIAGNOSIS — E876 Hypokalemia: Secondary | ICD-10-CM | POA: Diagnosis not present

## 2016-08-14 DIAGNOSIS — Z955 Presence of coronary angioplasty implant and graft: Secondary | ICD-10-CM | POA: Insufficient documentation

## 2016-08-14 DIAGNOSIS — Z950 Presence of cardiac pacemaker: Secondary | ICD-10-CM | POA: Diagnosis not present

## 2016-08-14 DIAGNOSIS — I2510 Atherosclerotic heart disease of native coronary artery without angina pectoris: Secondary | ICD-10-CM | POA: Insufficient documentation

## 2016-08-14 DIAGNOSIS — R059 Cough, unspecified: Secondary | ICD-10-CM

## 2016-08-14 DIAGNOSIS — R05 Cough: Secondary | ICD-10-CM | POA: Diagnosis not present

## 2016-08-14 LAB — CBC WITH DIFFERENTIAL/PLATELET
BASOS ABS: 0 10*3/uL (ref 0.0–0.1)
BASOS PCT: 0 %
Eosinophils Absolute: 0.2 10*3/uL (ref 0.0–0.7)
Eosinophils Relative: 2 %
HEMATOCRIT: 38.2 % — AB (ref 39.0–52.0)
HEMOGLOBIN: 12.9 g/dL — AB (ref 13.0–17.0)
Lymphocytes Relative: 20 %
Lymphs Abs: 1.8 10*3/uL (ref 0.7–4.0)
MCH: 33 pg (ref 26.0–34.0)
MCHC: 33.8 g/dL (ref 30.0–36.0)
MCV: 97.7 fL (ref 78.0–100.0)
Monocytes Absolute: 0.8 10*3/uL (ref 0.1–1.0)
Monocytes Relative: 9 %
NEUTROS ABS: 6 10*3/uL (ref 1.7–7.7)
NEUTROS PCT: 69 %
Platelets: 125 10*3/uL — ABNORMAL LOW (ref 150–400)
RBC: 3.91 MIL/uL — ABNORMAL LOW (ref 4.22–5.81)
RDW: 15.8 % — ABNORMAL HIGH (ref 11.5–15.5)
WBC: 8.8 10*3/uL (ref 4.0–10.5)

## 2016-08-14 LAB — URINALYSIS, ROUTINE W REFLEX MICROSCOPIC
Bilirubin Urine: NEGATIVE
Glucose, UA: NEGATIVE mg/dL
Hgb urine dipstick: NEGATIVE
KETONES UR: NEGATIVE mg/dL
LEUKOCYTES UA: NEGATIVE
NITRITE: NEGATIVE
PH: 6 (ref 5.0–8.0)
Protein, ur: NEGATIVE mg/dL
Specific Gravity, Urine: 1.005 (ref 1.005–1.030)

## 2016-08-14 LAB — PROTIME-INR
INR: 2.63
PROTHROMBIN TIME: 28.6 s — AB (ref 11.4–15.2)

## 2016-08-14 LAB — COMPREHENSIVE METABOLIC PANEL
ALBUMIN: 3.5 g/dL (ref 3.5–5.0)
ALK PHOS: 80 U/L (ref 38–126)
ALT: 9 U/L — ABNORMAL LOW (ref 17–63)
AST: 14 U/L — AB (ref 15–41)
Anion gap: 6 (ref 5–15)
BILIRUBIN TOTAL: 0.7 mg/dL (ref 0.3–1.2)
BUN: 22 mg/dL — AB (ref 6–20)
CALCIUM: 8.9 mg/dL (ref 8.9–10.3)
CO2: 32 mmol/L (ref 22–32)
Chloride: 103 mmol/L (ref 101–111)
Creatinine, Ser: 1.67 mg/dL — ABNORMAL HIGH (ref 0.61–1.24)
GFR calc Af Amer: 44 mL/min — ABNORMAL LOW (ref >60)
GFR calc non Af Amer: 38 mL/min — ABNORMAL LOW (ref >60)
GLUCOSE: 95 mg/dL (ref 65–99)
POTASSIUM: 3.2 mmol/L — AB (ref 3.5–5.1)
Sodium: 141 mmol/L (ref 135–145)
TOTAL PROTEIN: 6.6 g/dL (ref 6.5–8.1)

## 2016-08-14 LAB — DIGOXIN LEVEL: DIGOXIN LVL: 0.6 ng/mL — AB (ref 0.8–2.0)

## 2016-08-14 LAB — I-STAT TROPONIN, ED: Troponin i, poc: 0.03 ng/mL (ref 0.00–0.08)

## 2016-08-14 LAB — I-STAT CG4 LACTIC ACID, ED: LACTIC ACID, VENOUS: 0.79 mmol/L (ref 0.5–1.9)

## 2016-08-14 LAB — BRAIN NATRIURETIC PEPTIDE: B Natriuretic Peptide: 793 pg/mL — ABNORMAL HIGH (ref 0.0–100.0)

## 2016-08-14 MED ORDER — POTASSIUM CHLORIDE CRYS ER 20 MEQ PO TBCR: 20.0000 meq | EXTENDED_RELEASE_TABLET | Freq: Every day | ORAL | 0 refills | Status: DC

## 2016-08-14 MED ORDER — ALBUTEROL SULFATE (2.5 MG/3ML) 0.083% IN NEBU
5.0000 mg | INHALATION_SOLUTION | Freq: Once | RESPIRATORY_TRACT | Status: AC
  Administered 2016-08-14: 5 mg via RESPIRATORY_TRACT
  Filled 2016-08-14: qty 6

## 2016-08-14 MED ORDER — SODIUM CHLORIDE 0.9 % IV BOLUS (SEPSIS)
250.0000 mL | Freq: Once | INTRAVENOUS | Status: AC
  Administered 2016-08-14: 250 mL via INTRAVENOUS

## 2016-08-14 MED ORDER — BENZONATATE 100 MG PO CAPS: 100.0000 mg | ORAL_CAPSULE | Freq: Three times a day (TID) | ORAL | 0 refills | Status: DC | PRN

## 2016-08-14 NOTE — ED Triage Notes (Signed)
Pt reports a 5lb weight gain this week with a cough.  Denies edema or sob. States his physician has him weigh daily and he became concerned with the increase.

## 2016-08-14 NOTE — ED Notes (Signed)
RT at bedside.

## 2016-08-14 NOTE — ED Notes (Signed)
ED Provider at bedside. 

## 2016-08-14 NOTE — ED Provider Notes (Signed)
[facility] DEPT Provider Note   CSN: [id number] Arrival date & time: 08/14/16  1109     History   Chief Complaint Chief Complaint  Patient presents with  . Leg Swelling    HPI Troy [name] is a 76 y.o. male.  HPI Patient presents with a 5 pound weight gain over the last few days and nonproductive cough worsening last night. Denies any chest pain or discomfort. No new lower extremity swelling. Denies any fever chills. States he's been compliant with his medication. Past Medical History:  Diagnosis Date  . Atrial fibrillation ([place])   . CAD (coronary artery disease) 1996   status post PCI of the RCA   . Congestive heart failure, unspecified   . COPD (chronic obstructive pulmonary disease) (HCC)    Pt on home O2 at night and PRN, unsure of date of diagnosis.  . Diabetes mellitus, type 2 ([place])   . DJD (degenerative joint disease)   . GERD (gastroesophageal reflux disease)   . Gout   . HTN (hypertension)   . 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 Nephrolithiasis   . Other primary cardiomyopathies   . Personal history of DVT (deep vein thrombosis)   . Prostatitis   . Shingles     Patient Active Problem List   Diagnosis Date Noted  . Acute on chronic systolic CHF (congestive heart failure) ([place]) 06/09/2016  . CHF (congestive heart failure) ([place]) 06/09/2016  . Acute on chronic respiratory failure with hypoxia and hypercapnia ([place]) 03/08/2016  . Lower GI bleed 09/11/2015  . Bright red blood per rectum 09/11/2015  . Community acquired pneumonia 08/24/2015  . Acute exacerbation of chronic obstructive pulmonary disease (COPD) ([place]) 03/05/2015  . Acute renal failure superimposed on stage 3 chronic kidney disease ([place]) 03/05/2015  . Renal failure (ARF), acute on chronic (HCC)   . Stomach ache 01/18/2015  . AKI (acute kidney injury) ([place]) 12/26/2014  . Acute exacerbation of CHF (congestive heart failure) ([place]) 12/25/2014  . BPH (benign prostatic hyperplasia)  12/25/2014  . FTT (failure to thrive) in adult 12/25/2014  . Encounter for therapeutic drug monitoring 08/05/2014  . Pulmonary nodules 12/23/2013  . Hemoptysis   . Dyspnea   . Chronic kidney disease, stage 3   . Coronary artery disease involving native coronary artery of native heart without angina pectoris   . Paroxysmal atrial fibrillation (HCC)   . Frequent PVCs   . Hypoxia   . Acute on chronic respiratory failure with hypoxia ([place])   . Chronic obstructive pulmonary disease with acute exacerbation ([place])   . COPD (chronic obstructive pulmonary disease) ([place]) 06/16/2013  . Lipoma of neck 09/11/2011  . 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 Coronary artery disease 01/30/2011  . Chronic atrial fibrillation ([place]) 01/30/2011  . Chronic systolic CHF (congestive heart failure) ([place])   . Other primary cardiomyopathies   . Diverticulosis of colon with hemorrhage 08/28/2010    Past Surgical History:  Procedure Laterality Date  . BACK SURGERY    . CARDIAC DEFIBRILLATOR PLACEMENT    . CORONARY STENT PLACEMENT    . PACEMAKER INSERTION         Home Medications    Prior to Admission medications   Medication Sig Start Date End Date Taking? Authorizing Provider  acetaminophen (TYLENOL) 500 MG tablet Take 500 mg by mouth every 6 (six) hours as needed for mild pain or moderate pain.   Yes [provider]  albuterol (ACCUNEB) 1.25 MG/3ML nebulizer solution Take 3 mLs  by nebulization 4 (four) times daily. 08/21/15  Yes [provider]  alfuzosin (UROXATRAL) 10 MG 24 hr tablet Take 10 mg by mouth daily with breakfast.    Yes [provider]  allopurinol (ZYLOPRIM) 100 MG tablet Take 100 mg by mouth daily.   Yes [provider]  atorvastatin (LIPITOR) 10 MG tablet Take 1 tablet (10 mg total) by mouth at bedtime. 08/03/14  Yes Jettie Booze, MD  carvedilol (COREG) 12.5 MG tablet Take 1 tablet (12.5 mg total) by mouth 2  (two) times daily with a meal. 03/03/15  Yes Jettie Booze, MD  cetirizine (ZYRTEC) 10 MG tablet Take 10 mg by mouth daily.   Yes [provider]  digoxin (LANOXIN) 0.125 MG tablet Take 0.5 tablets (0.0625 mg total) by mouth daily. 06/23/15  Yes Jettie Booze, MD  fluticasone [facility]) 50 MCG/ACT nasal spray Place 2 sprays into both nostrils at bedtime as needed for allergies.  04/07/13  Yes [provider]  furosemide (LASIX) 40 MG tablet Take 1.5 tablets (60 mg total) by mouth 2 (two) times daily. 06/11/16  Yes Patrecia Pour, Christean Grief, MD  guaiFENesin (MUCINEX) 600 MG 12 hr tablet Take 1 tablet (600 mg total) by mouth 2 (two) times daily. 03/10/16  Yes Kathie Dike, MD  mometasone-formoterol (DULERA) 100-5 MCG/ACT AERO Inhale 2 puffs into the lungs 2 (two) times daily. 09/13/15  Yes Vann, Jessica U, DO  PROAIR HFA 108 (90 BASE) MCG/ACT inhaler INHALE 2 PUFFS EVERY 4 HOURS AS NEEDED FOR WHEEZING OR SHORTNESS OF BREATH. 09/05/14  Yes Collene Gobble, MD  tiotropium (SPIRIVA) 18 MCG inhalation capsule Place 1 capsule (18 mcg total) into inhaler and inhale daily. 03/11/16  Yes Kathie Dike, MD  warfarin (COUMADIN) 1 MG tablet Take 2 tabs daily except 3 tabs on Wednesday & Saturday or as directed by [facility] Patient taking differently: Take 2mg  by mouth on Tues, Wed, Thur,Fri, Sat & Sun. Take 3mg  by mouth on Monday as directed by clinic. 09/13/15  Yes Vann, Jessica U, DO  benzonatate (TESSALON) 100 MG capsule Take 1 capsule (100 mg total) by mouth 3 (three) times daily as needed for cough. 08/14/16   Julianne Rice, MD  potassium chloride SA (K-DUR,KLOR-CON) 20 MEQ tablet Take 1 tablet (20 mEq total) by mouth daily. 08/14/16   Julianne Rice, MD    Family History Family History  Problem Relation Age of Onset  . Heart disease Father   . Cancer Father        LUNG  . Colon cancer Mother   . Stroke Paternal Uncle   . Heart attack Neg Hx   . Hyperlipidemia Neg Hx   .  Hypertension Neg Hx     Social History Social History  Substance Use Topics  . Smoking status: Former Smoker    Packs/day: 4.00    Years: 50.00    Types: Cigarettes    Quit date: 01/22/1980  . Smokeless tobacco: Never Used  . Alcohol use No     Comment: denies     Allergies   Benadryl [diphenhydramine hcl]   Review of Systems Review of Systems  Constitutional: Negative for chills and fever.  Respiratory: Positive for cough. Negative for shortness of breath and wheezing.   Cardiovascular: Negative for chest pain, palpitations and leg swelling.  Gastrointestinal: Negative for abdominal pain, diarrhea, nausea and vomiting.  Genitourinary: Negative for dysuria and flank pain.  Musculoskeletal: Negative for back pain, myalgias, neck pain and neck stiffness.  Skin: Negative for rash and wound.  Neurological: Negative for dizziness, weakness, light-headedness, numbness and headaches.  All other systems reviewed and are negative.    Physical Exam Updated Vital Signs BP (!) 157/55   Pulse (!) 59   Temp 97.8 F (36.6 C) (Oral)   Resp 16   Ht 5\' 5"  (1.651 m)   Wt 77.1 kg (170 lb)   SpO2 95%   BMI 28.29 kg/m   Physical Exam  Constitutional: He is oriented to person, place, and time. He appears well-developed and well-nourished. No distress.  HENT:  Head: Normocephalic and atraumatic.  Mouth/Throat: Oropharynx is clear and moist. No oropharyngeal exudate.  Eyes: Pupils are equal, round, and reactive to light. EOM are normal.  Neck: Normal range of motion. Neck supple.  Large right posterior cervical cyst. No tenderness to palpation. No warmth or erythema.  Cardiovascular: Normal rate and regular rhythm.  Exam reveals no gallop and no friction rub.   No murmur heard. Pulmonary/Chest: Effort normal.  Diminished breath sounds throughout.  Abdominal: Soft. Bowel sounds are normal. There is no tenderness. There is no rebound and no guarding.  Musculoskeletal: Normal range of  motion. He exhibits no edema or tenderness.  1+ bilateral lower extremity pitting edema. Distal pulses intact.  Lymphadenopathy:    He has no cervical adenopathy.  Neurological: He is alert and oriented to person, place, and time.  Moving all extremities without focal deficit. Sensation fully intact.  Skin: Skin is warm and dry. Capillary refill takes less than 2 seconds. No rash noted. He is not diaphoretic. No erythema.  Psychiatric: He has a normal mood and affect. His behavior is normal.  Nursing note and vitals reviewed.    ED Treatments / Results  Labs (all labs ordered are listed, but only abnormal results are displayed) Labs Reviewed  CBC WITH DIFFERENTIAL/PLATELET - Abnormal; Notable for the following:       Result Value   RBC 3.91 (*)    Hemoglobin 12.9 (*)    HCT 38.2 (*)    RDW 15.8 (*)    Platelets 125 (*)    All other components within normal limits  COMPREHENSIVE METABOLIC PANEL - Abnormal; Notable for the following:    Potassium 3.2 (*)    BUN 22 (*)    Creatinine, Ser 1.67 (*)    AST 14 (*)    ALT 9 (*)    GFR calc non Af Amer 38 (*)    GFR calc Af Amer 44 (*)    All other components within normal limits  BRAIN NATRIURETIC PEPTIDE - Abnormal; Notable for the following:    B Natriuretic Peptide 793.0 (*)    All other components within normal limits  PROTIME-INR - Abnormal; Notable for the following:    Prothrombin Time 28.6 (*)    All other components within normal limits  URINALYSIS, ROUTINE W REFLEX MICROSCOPIC - Abnormal; Notable for the following:    Color, Urine STRAW (*)    All other components within normal limits  DIGOXIN LEVEL - Abnormal; Notable for the following:    Digoxin Level 0.6 (*)    All other components within normal limits  I-STAT TROPONIN, ED  I-STAT CG4 LACTIC ACID, ED    EKG  EKG Interpretation None       Radiology Dg Chest 2 View  Result Date: 08/14/2016 CLINICAL DATA:  5 pound weight gain this week with cough. No  shortness of breath or peripheral edema. History of chronic CHF, COPD,  coronary artery disease, chronic renal insufficiency stage III. EXAM: CHEST  2 VIEW COMPARISON:  Portable chest x-ray of Jun 11, 2016 and PA and lateral chest x-ray of Jun 09, 2016. FINDINGS: The lungs are mildly hyperinflated with hemidiaphragm flattening. Previously demonstrated infiltrates at the lung bases have cleared. The cardiac silhouette is enlarged but stable. The pulmonary vascularity is normal. The ICD is in stable position. There is calcification in the wall of the aortic arch. There is multilevel degenerative disc disease of the thoracic spine. There are degenerative changes of the right shoulder. IMPRESSION: No evidence of pulmonary edema nor other acute cardiopulmonary abnormality. Thoracic aortic atherosclerosis. Electronically Signed   By: Wynell Halberg  [place] M.D.   On: 08/14/2016 13:44    Procedures Procedures (including critical care time)  Medications Ordered in ED Medications  sodium chloride 0.9 % bolus 250 mL (0 mLs Intravenous Stopped 08/14/16 1438)  albuterol (PROVENTIL) (2.5 MG/3ML) 0.083% nebulizer solution 5 mg (5 mg Nebulization Given 08/14/16 1556)     Initial Impression / Assessment and Plan / ED Course  I have reviewed the triage vital signs and the nursing notes.  Pertinent labs & imaging results that were available during my care of the patient were reviewed by me and considered in my medical decision making (see chart for details).     Patient ambulated without any distress. Mildly decreased potassium. We'll give oral potassium replacement. Patient may possibly have some mild dehydration. Also given breathing treatment with improvement of his cough. Advised to continue using his inhaler at home. Also advised to follow with his primary physician and return precautions have been given.  Final Clinical Impressions(s) / ED Diagnoses   Final diagnoses:  Cough in adult  Hypokalemia  Dehydration,  mild    New Prescriptions New Prescriptions   BENZONATATE (TESSALON) 100 MG CAPSULE    Take 1 capsule (100 mg total) by mouth 3 (three) times daily as needed for cough.   POTASSIUM CHLORIDE SA (K-DUR,KLOR-CON) 20 MEQ TABLET    Take 1 tablet (20 mEq total) by mouth daily.     Julianne Rice, MD 08/14/16 1700

## 2016-08-15 ENCOUNTER — Ambulatory Visit (INDEPENDENT_AMBULATORY_CARE_PROVIDER_SITE_OTHER): Payer: Medicare Other | Admitting: *Deleted

## 2016-08-15 DIAGNOSIS — I480 Paroxysmal atrial fibrillation: Secondary | ICD-10-CM | POA: Diagnosis not present

## 2016-08-15 DIAGNOSIS — I482 Chronic atrial fibrillation, unspecified: Secondary | ICD-10-CM

## 2016-08-15 DIAGNOSIS — I82409 Acute embolism and thrombosis of unspecified deep veins of unspecified lower extremity: Secondary | ICD-10-CM

## 2016-08-15 DIAGNOSIS — Z5181 Encounter for therapeutic drug level monitoring: Secondary | ICD-10-CM | POA: Diagnosis not present

## 2016-08-15 LAB — POCT INR: INR: 2.9

## 2016-08-16 ENCOUNTER — Telehealth: Payer: Self-pay | Admitting: Interventional Cardiology

## 2016-08-16 NOTE — Telephone Encounter (Signed)
Patient calling and state that he takes furosemide 60 mg (1.5 tablets) BID. Patient states that he has had trouble getting the right amount of furosemide pills to last him through the month. He states that he ran out early and had to come out of his own pocket to get more pills. He states that whoever ordered it before did not order it right. The patient states that he does not need a new Rx at this time but he is going to call us next time to reorder the medicine. Patient denies having any other needs at this time.

## 2016-08-16 NOTE — Telephone Encounter (Signed)
New message    Pt is calling asking for a call back. He said it's about his furosemide.

## 2016-08-29 ENCOUNTER — Other Ambulatory Visit: Payer: Self-pay | Admitting: Interventional Cardiology

## 2016-08-29 MED ORDER — WARFARIN SODIUM 1 MG PO TABS: ORAL_TABLET | ORAL | 1 refills | Status: DC

## 2016-08-29 NOTE — Telephone Encounter (Signed)
Patient walked into office requesting a refill on warfarin (COUMADIN) 1 MG tablet  [facility].

## 2016-08-29 NOTE — Telephone Encounter (Signed)
Done

## 2016-09-04 ENCOUNTER — Encounter: Payer: Self-pay | Admitting: Internal Medicine

## 2016-09-04 LAB — HM DIABETES EYE EXAM

## 2016-09-05 ENCOUNTER — Telehealth: Payer: Self-pay | Admitting: Interventional Cardiology

## 2016-09-05 MED ORDER — FUROSEMIDE 40 MG PO TABS: 60.0000 mg | ORAL_TABLET | Freq: Two times a day (BID) | ORAL | 11 refills | Status: DC

## 2016-09-05 NOTE — Telephone Encounter (Signed)
New Message  Pt call requesting to speak with Rn about getting his lasix refilled. Pt states he is having a difficult time getting it refilled. Please call back to discuss

## 2016-09-05 NOTE — Telephone Encounter (Signed)
Refill sent in to patient's preferred pharmacy.

## 2016-09-09 ENCOUNTER — Other Ambulatory Visit: Payer: Self-pay | Admitting: Retina Specialist

## 2016-09-09 DIAGNOSIS — H34239 Retinal artery branch occlusion, unspecified eye: Secondary | ICD-10-CM

## 2016-09-10 ENCOUNTER — Ambulatory Visit (HOSPITAL_BASED_OUTPATIENT_CLINIC_OR_DEPARTMENT_OTHER): Payer: Medicare Other

## 2016-09-10 ENCOUNTER — Ambulatory Visit (HOSPITAL_COMMUNITY)
Admission: RE | Admit: 2016-09-10 | Discharge: 2016-09-10 | Disposition: A | Discharge status: Home / Self Care | Payer: Medicare Other | Source: Ambulatory Visit | Attending: Cardiology | Admitting: Cardiology

## 2016-09-10 ENCOUNTER — Other Ambulatory Visit: Payer: Self-pay

## 2016-09-10 DIAGNOSIS — N183 Chronic kidney disease, stage 3 (moderate): Secondary | ICD-10-CM | POA: Insufficient documentation

## 2016-09-10 DIAGNOSIS — I4891 Unspecified atrial fibrillation: Secondary | ICD-10-CM | POA: Insufficient documentation

## 2016-09-10 DIAGNOSIS — J449 Chronic obstructive pulmonary disease, unspecified: Secondary | ICD-10-CM | POA: Insufficient documentation

## 2016-09-10 DIAGNOSIS — I351 Nonrheumatic aortic (valve) insufficiency: Secondary | ICD-10-CM | POA: Insufficient documentation

## 2016-09-10 DIAGNOSIS — I2510 Atherosclerotic heart disease of native coronary artery without angina pectoris: Secondary | ICD-10-CM | POA: Insufficient documentation

## 2016-09-10 DIAGNOSIS — I429 Cardiomyopathy, unspecified: Secondary | ICD-10-CM | POA: Insufficient documentation

## 2016-09-10 DIAGNOSIS — Z87891 Personal history of nicotine dependence: Secondary | ICD-10-CM | POA: Insufficient documentation

## 2016-09-10 DIAGNOSIS — I517 Cardiomegaly: Secondary | ICD-10-CM | POA: Diagnosis not present

## 2016-09-10 DIAGNOSIS — I6523 Occlusion and stenosis of bilateral carotid arteries: Secondary | ICD-10-CM | POA: Insufficient documentation

## 2016-09-10 DIAGNOSIS — H34239 Retinal artery branch occlusion, unspecified eye: Secondary | ICD-10-CM | POA: Diagnosis not present

## 2016-09-10 DIAGNOSIS — Z95810 Presence of automatic (implantable) cardiac defibrillator: Secondary | ICD-10-CM | POA: Insufficient documentation

## 2016-09-10 DIAGNOSIS — E785 Hyperlipidemia, unspecified: Secondary | ICD-10-CM | POA: Diagnosis not present

## 2016-09-10 DIAGNOSIS — I6501 Occlusion and stenosis of right vertebral artery: Secondary | ICD-10-CM | POA: Diagnosis not present

## 2016-09-10 DIAGNOSIS — I509 Heart failure, unspecified: Secondary | ICD-10-CM | POA: Insufficient documentation

## 2016-09-10 MED ORDER — PERFLUTREN LIPID MICROSPHERE
1.0000 mL | INTRAVENOUS | Status: AC | PRN
  Administered 2016-09-10: 2 mL via INTRAVENOUS

## 2016-09-12 ENCOUNTER — Ambulatory Visit (INDEPENDENT_AMBULATORY_CARE_PROVIDER_SITE_OTHER): Payer: Medicare Other | Admitting: *Deleted

## 2016-09-12 DIAGNOSIS — I480 Paroxysmal atrial fibrillation: Secondary | ICD-10-CM

## 2016-09-12 DIAGNOSIS — I482 Chronic atrial fibrillation, unspecified: Secondary | ICD-10-CM

## 2016-09-12 DIAGNOSIS — I82409 Acute embolism and thrombosis of unspecified deep veins of unspecified lower extremity: Secondary | ICD-10-CM | POA: Diagnosis not present

## 2016-09-12 DIAGNOSIS — Z5181 Encounter for therapeutic drug level monitoring: Secondary | ICD-10-CM | POA: Diagnosis not present

## 2016-09-12 LAB — POCT INR: INR: 2.5

## 2016-09-17 ENCOUNTER — Telehealth: Payer: Self-pay | Admitting: Interventional Cardiology

## 2016-09-17 NOTE — Telephone Encounter (Signed)
Patient calling and states that Dr. Ricki Miller ordered an echocardiogram and carotid ultrasound (results in EPIC). The patient is requesting that [name] review the tests and give any additional recommendations. Patient made aware that the information would be forwarded. Patient verbalized understanding and thanked me for the call.

## 2016-09-17 NOTE — Telephone Encounter (Signed)
New message    Pt is calling asking about his test results from last week. Please call.

## 2016-09-19 ENCOUNTER — Ambulatory Visit (INDEPENDENT_AMBULATORY_CARE_PROVIDER_SITE_OTHER): Payer: Medicare Other | Admitting: Family Medicine

## 2016-09-19 ENCOUNTER — Encounter: Payer: Self-pay | Admitting: Family Medicine

## 2016-09-19 VITALS — BP 110/64 | HR 93 | Temp 98.1°F | Resp 16 | Ht 65.5 in | Wt 178.0 lb

## 2016-09-19 DIAGNOSIS — Z79899 Other long term (current) drug therapy: Secondary | ICD-10-CM

## 2016-09-19 DIAGNOSIS — Z8679 Personal history of other diseases of the circulatory system: Secondary | ICD-10-CM

## 2016-09-19 DIAGNOSIS — H53132 Sudden visual loss, left eye: Secondary | ICD-10-CM | POA: Diagnosis not present

## 2016-09-19 DIAGNOSIS — E119 Type 2 diabetes mellitus without complications: Secondary | ICD-10-CM

## 2016-09-19 DIAGNOSIS — E782 Mixed hyperlipidemia: Secondary | ICD-10-CM

## 2016-09-19 MED ORDER — ATORVASTATIN CALCIUM 10 MG PO TABS: 10.0000 mg | ORAL_TABLET | Freq: Every day | ORAL | 1 refills | Status: DC

## 2016-09-19 NOTE — Telephone Encounter (Signed)
Echo shows stable heart function.  Mild to moderate carotid disease.  Continue medical therapy.

## 2016-09-19 NOTE — Progress Notes (Signed)
Patient ID: Troy [name], male    DOB: 09/09/1940, 76 y.o.   MRN: [id number]  Chief Complaint  Patient presents with  . Eye Problem    spot on eye, has seen opt  . Lab    lab ordres from [facility]  . Hyperlipidemia  . Diabetes    Allergies Benadryl [diphenhydramine hcl]  Subjective:   Troy [name] is a 76 y.o. male who presents to [facility] today.  HPI Here for a visit to establish care. Reports that feels good overall. Reports that is followed by cardiology and several other doctors for all of his medical problems. Reports that initially had heart problems back in 1997 and then did great until 2015. Reports that started with COPD and CHF. Take lasix everyday and it helps.   Reports that is here b/c needs some labs done from his eye doctor, Dr. Billie [name] at [facility]. Needs the labs done b/c in his left eye has an area where he cannot see well. Has not had a problem like this in the past. Reports that wears corrective lenses and has not even had to have them changed in three years.   Has a history of diabetes but has not been on medications for years. Was able to get off medications and control sugars with diet. Would like to know how his sugars are running.  Reports that he has not been taking potassium. Reports that takes his lasix each day as directed and monitors his weight. NO breathing issues, no CP, no new DOE or orthopnea. Walks at his regular pace or he will get SOB. No CP. Reports that occasionally may get a cramp in his leg but does not always happen. Walks without pain. Does use cane for assistive device.    Eye Problem   The left eye is affected. This is a new problem. The current episode started in the past 7 days. The problem occurs constantly. The problem has been unchanged. There was no injury mechanism. The pain is at a severity of 0/10. The patient is experiencing no pain. There is no known exposure to pink eye. He  does not wear contacts. Pertinent negatives include no blurred vision, eye discharge, double vision, eye redness, fever, itching, nausea, photophobia, vomiting or weakness. He has tried nothing for the symptoms. The treatment provided no relief.  Hyperlipidemia  This is a chronic problem. The current episode started more than 1 year ago. Condition status: reports that he is not sure, has been on the medication for a long time but not had it checked.  Lipid results: unsure. Exacerbating diseases include diabetes. He has no history of chronic renal disease or liver disease. Factors aggravating his hyperlipidemia include fatty foods. Pertinent negatives include no chest pain. Current antihyperlipidemic treatment includes statins. Improvement on treatment: unsure. There are no compliance problems.  Risk factors for coronary artery disease include dyslipidemia, diabetes mellitus, family history and male sex (ASCVD).    Past Medical History:  Diagnosis Date  . Atrial fibrillation ([place])   . CAD (coronary artery disease) 1996   status post PCI of the RCA   . Community acquired pneumonia 08/24/2015  . Congestive heart failure, unspecified   . COPD (chronic obstructive pulmonary disease) (HCC)    Pt on home O2 at night and PRN, unsure of date of diagnosis.  . Diabetes mellitus, type 2 ([place])   . DJD (degenerative joint disease)   . GERD (gastroesophageal reflux disease)   .  Gout   . HTN (hypertension)   . 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 Nephrolithiasis   . Other primary cardiomyopathies   . Personal history of DVT (deep vein thrombosis)   . Prostatitis   . Shingles     Past Surgical History:  Procedure Laterality Date  . BACK SURGERY    . CARDIAC DEFIBRILLATOR PLACEMENT    . CORONARY STENT PLACEMENT    . PACEMAKER INSERTION      Family History  Problem Relation Age of Onset  . Heart disease Father   . Cancer Father        LUNG  . Colon cancer Mother   . Stroke Paternal Uncle     . Heart attack Neg Hx   . Hyperlipidemia Neg Hx   . Hypertension Neg Hx      Social History   Social History  . Marital status: Single    Spouse name: N/A  . Number of children: 0  . Years of education: N/A   Occupational History  . Retired from [employer]    Social History Main Topics  . Smoking status: Former Smoker    Packs/day: 4.00    Years: 50.00    Types: Cigarettes    Quit date: 01/22/1980  . Smokeless tobacco: Never Used  . Alcohol use No     Comment: denies  . Drug use: No  . Sexual activity: No   Other Topics Concern  . None   Social History Narrative   Lives alone.  Single.  No children.  Ambulates with a cane.  Has a deceased brother and is estranged from sisters.    Outpatient Medications Prior to Visit  Medication Sig Dispense Refill  . acetaminophen (TYLENOL) 500 MG tablet Take 500 mg by mouth every 6 (six) hours as needed for mild pain or moderate pain.    Marland Kitchen albuterol (ACCUNEB) 1.25 MG/3ML nebulizer solution Take 3 mLs by nebulization 4 (four) times daily.  2  . alfuzosin (UROXATRAL) 10 MG 24 hr tablet Take 10 mg by mouth daily with breakfast.     . allopurinol (ZYLOPRIM) 100 MG tablet Take 100 mg by mouth daily.    . benzonatate (TESSALON) 100 MG capsule Take 1 capsule (100 mg total) by mouth 3 (three) times daily as needed for cough. 21 capsule 0  . carvedilol (COREG) 12.5 MG tablet Take 1 tablet (12.5 mg total) by mouth 2 (two) times daily with a meal. 60 tablet 6  . cetirizine (ZYRTEC) 10 MG tablet Take 10 mg by mouth daily.    . digoxin (LANOXIN) 0.125 MG tablet Take 0.5 tablets (0.0625 mg total) by mouth daily. 45 tablet 3  . fluticasone (FLONASE) 50 MCG/ACT nasal spray Place 2 sprays into both nostrils at bedtime as needed for allergies.     . furosemide (LASIX) 40 MG tablet Take 1.5 tablets (60 mg total) by mouth 2 (two) times daily. 90 tablet 11  . guaiFENesin (MUCINEX) 600 MG 12 hr tablet Take 1 tablet (600 mg total) by mouth 2 (two)  times daily. 30 tablet 0  . mometasone-formoterol (DULERA) 100-5 MCG/ACT AERO Inhale 2 puffs into the lungs 2 (two) times daily. 1 Inhaler 2  . PROAIR HFA 108 (90 BASE) MCG/ACT inhaler INHALE 2 PUFFS EVERY 4 HOURS AS NEEDED FOR WHEEZING OR SHORTNESS OF BREATH. 8.5 g 1  . tiotropium (SPIRIVA) 18 MCG inhalation capsule Place 1 capsule (18 mcg total) into inhaler and inhale daily. 30 capsule 12  . warfarin (  COUMADIN) 1 MG tablet Take 33m by mouth on Tues, Wed, Thur,Fri, Sat & Sun. Take 359mby mouth on Monday as directed by clinic. 70 tablet 1  . atorvastatin (LIPITOR) 10 MG tablet Take 1 tablet (10 mg total) by mouth at bedtime. 90 tablet 1  . potassium chloride SA (K-DUR,KLOR-CON) 20 MEQ tablet Take 1 tablet (20 mEq total) by mouth daily. 3 tablet 0   No facility-administered medications prior to visit.     Review of Systems  Constitutional: Negative for activity change, appetite change, fatigue, fever and unexpected weight change.  HENT: Negative for congestion, nosebleeds, postnasal drip, rhinorrhea and trouble swallowing.   Eyes: Positive for visual disturbance. Negative for blurred vision, double vision, photophobia, pain, discharge, redness and itching.  Respiratory: Negative for choking and chest tightness.        Takes time walking and does not get short of breath.   Cardiovascular: Negative for chest pain, palpitations and leg swelling.  Gastrointestinal: Negative for nausea and vomiting.  Skin: Negative for rash.  Neurological: Negative for dizziness, tremors, syncope, speech difficulty, weakness, numbness and headaches.  Hematological: Negative for adenopathy. Does not bruise/bleed easily.  Psychiatric/Behavioral: Negative for agitation. The patient is not nervous/anxious.      Objective:   BP 110/64 (BP Location: Left Arm, Patient Position: Sitting, Cuff Size: Normal)   Pulse 93   Temp 98.1 F (36.7 C) (Other (Comment))   Resp 16   Ht 5' 5.5" (1.664 m)   Wt 178 lb (80.7 kg)    SpO2 93%   BMI 29.17 kg/m   Physical Exam Constitutional: Patient is oriented to person, place, and time. Appears well-developed and well-nourished. No distress.  HENT:  Head: Normocephalic and atraumatic.  Eyes: Pupils are equal, round, and reactive to light. Conjunctivae is non-icteric, and non-injected.   Neck: Normal range of motion. Neck supple. No thyromegaly present.  Cardiovascular: Normal rate, regular rhythm and normal heart sounds.   No murmur heard. Pulmonary/Chest: Effort normal and breath sounds normal. No respiratory distress. No wheezes.  Neurological: Alert and oriented to person, place, and time. No gross cranial nerve deficit.  Skin: Skin is warm and dry.  Psychiatric: Normal mood and affect. Behavior is normal.    IAssessment and Plan   1. Sudden visual loss of left eye, partial, followed by Opthalmology Keep scheduled [facility] Will send reports to cardiology and opthalmology.  Check labs as requested.  - CBC with Differential - Sed Rate (ESR) - CRP High sensitivity  Keep appointment with cardiology, has had recent carotid duplex. Reviewed.    2. Mixed hyperlipidemia  - atorvastatin (LIPITOR) 10 MG tablet; Take 1 tablet (10 mg total) by mouth at bedtime.  Dispense: 90 tablet; Refill: 1 - Lipid panel TLC modifications discussed.   3. History of ASCVD Follow up with cardiology - atorvastatin (LIPITOR) 10 MG tablet; Take 1 tablet (10 mg total) by mouth at bedtime.  Dispense: 90 tablet; Refill: 1 - Lipid panel  4. High risk medications (not anticoagulants) long-term use  - Hepatic function panel  5. Controlled type 2 diabetes mellitus without complication, without long-term current use of insulin (HCC) History of diabetes, off medications, needs check.  - Basic Metabolic Panel (BMET) - Hemoglobin A1C    6.  COPD: stable. Will refill medications as needed. Reports that has had a pneumovax.         [facility] Dr. BeSophronia Simasnd prior PCP. Patient  to complete forms at check out.   Caren Macadam, MD 09/19/2016

## 2016-09-19 NOTE — Progress Notes (Signed)
   Subjective:    Patient ID: Troy [name], male    DOB: 1940/11/07, 76 y.o.   MRN: [id number]  HPI    Review of Systems     Objective:   Physical Exam        Assessment & Plan:

## 2016-09-19 NOTE — Telephone Encounter (Signed)
Patient made aware.

## 2016-09-20 ENCOUNTER — Encounter: Payer: Self-pay | Admitting: Family Medicine

## 2016-09-20 LAB — BASIC METABOLIC PANEL
BUN: 25 mg/dL (ref 7–25)
CALCIUM: 9.1 mg/dL (ref 8.6–10.3)
CO2: 30 mmol/L (ref 20–32)
Chloride: 102 mmol/L (ref 98–110)
Creat: 1.7 mg/dL — ABNORMAL HIGH (ref 0.70–1.18)
GLUCOSE: 91 mg/dL (ref 65–99)
Potassium: 3.7 mmol/L (ref 3.5–5.3)
SODIUM: 143 mmol/L (ref 135–146)

## 2016-09-20 LAB — HEPATIC FUNCTION PANEL
ALBUMIN: 3.9 g/dL (ref 3.6–5.1)
ALK PHOS: 99 U/L (ref 40–115)
ALT: 7 U/L — ABNORMAL LOW (ref 9–46)
AST: 12 U/L (ref 10–35)
Bilirubin, Direct: 0.1 mg/dL (ref <=0.2)
Indirect Bilirubin: 0.5 mg/dL (ref 0.2–1.2)
TOTAL PROTEIN: 6.3 g/dL (ref 6.1–8.1)
Total Bilirubin: 0.6 mg/dL (ref 0.2–1.2)

## 2016-09-20 LAB — LIPID PANEL
Cholesterol: 135 mg/dL (ref <200)
HDL: 42 mg/dL (ref >40)
LDL Cholesterol: 59 mg/dL (ref <100)
TRIGLYCERIDES: 168 mg/dL — AB (ref <150)
Total CHOL/HDL Ratio: 3.2 Ratio (ref <5.0)
VLDL: 34 mg/dL — ABNORMAL HIGH (ref <30)

## 2016-09-20 LAB — HEMOGLOBIN A1C
HEMOGLOBIN A1C: 6 % — AB (ref <5.7)
Mean Plasma Glucose: 126 mg/dL

## 2016-09-25 ENCOUNTER — Telehealth: Payer: Self-pay | Admitting: Family Medicine

## 2016-09-25 MED ORDER — ALBUTEROL SULFATE HFA 108 (90 BASE) MCG/ACT IN AERS: 1.0000 | INHALATION_SPRAY | Freq: Four times a day (QID) | RESPIRATORY_TRACT | 1 refills | Status: DC | PRN

## 2016-09-25 NOTE — Telephone Encounter (Signed)
Refill ordered. Explained to patient at [place] that needed to use sparingly due to Afib. This was reviewed by the front office staff.

## 2016-09-26 ENCOUNTER — Encounter: Payer: Self-pay | Admitting: Family Medicine

## 2016-09-26 LAB — CBC WITH DIFFERENTIAL/PLATELET
BASOS ABS: 32 {cells}/uL (ref 0–200)
Basophils Relative: 0.4 %
EOS ABS: 192 {cells}/uL (ref 15–500)
Eosinophils Relative: 2.4 %
HEMATOCRIT: 38.4 % — AB (ref 38.5–50.0)
HEMOGLOBIN: 13.1 g/dL — AB (ref 13.2–17.1)
LYMPHS ABS: 1720 {cells}/uL (ref 850–3900)
MCH: 32.8 pg (ref 27.0–33.0)
MCHC: 34.1 g/dL (ref 32.0–36.0)
MCV: 96.2 fL (ref 80.0–100.0)
MPV: 10.7 fL (ref 7.5–12.5)
Monocytes Relative: 8.2 %
NEUTROS ABS: 5400 {cells}/uL (ref 1500–7800)
Neutrophils Relative %: 67.5 %
Platelets: 133 10*3/uL — ABNORMAL LOW (ref 140–400)
RBC: 3.99 10*6/uL — ABNORMAL LOW (ref 4.20–5.80)
RDW: 14.4 % (ref 11.0–15.0)
Total Lymphocyte: 21.5 %
WBC: 8 10*3/uL (ref 3.8–10.8)
WBCMIX: 656 {cells}/uL (ref 200–950)

## 2016-09-26 LAB — HEMOGLOBIN A1C
Hgb A1c MFr Bld: 6.2 % of total Hgb — ABNORMAL HIGH (ref <5.7)
Mean Plasma Glucose: 131 (calc)
eAG (mmol/L): 7.3 (calc)

## 2016-09-26 LAB — BASIC METABOLIC PANEL WITH GFR
BUN / CREAT RATIO: 14 (calc) (ref 6–22)
BUN: 23 mg/dL (ref 7–25)
CALCIUM: 9.3 mg/dL (ref 8.6–10.3)
CHLORIDE: 100 mmol/L (ref 98–110)
CO2: 32 mmol/L (ref 20–32)
Creat: 1.63 mg/dL — ABNORMAL HIGH (ref 0.70–1.18)
GFR, EST AFRICAN AMERICAN: 47 mL/min/{1.73_m2} — AB (ref >=60)
GFR, Est Non African American: 40 mL/min/{1.73_m2} — ABNORMAL LOW (ref >=60)
Glucose, Bld: 96 mg/dL (ref 65–139)
POTASSIUM: 3.9 mmol/L (ref 3.5–5.3)
SODIUM: 143 mmol/L (ref 135–146)

## 2016-09-26 LAB — SEDIMENTATION RATE: Sed Rate: 36 mm/h — ABNORMAL HIGH (ref 0–20)

## 2016-09-26 LAB — HIGH SENSITIVITY CRP: hs-CRP: 8.5 mg/L — ABNORMAL HIGH

## 2016-09-26 IMAGING — DX DG CHEST 2V
2 series · 2 of 2 positions shown · non-contrast
Comparison: 12/25/2014

CLINICAL DATA: Pleural effusion.  Shortness of breath.

EXAM:
CHEST  2 VIEW

[chest pa]
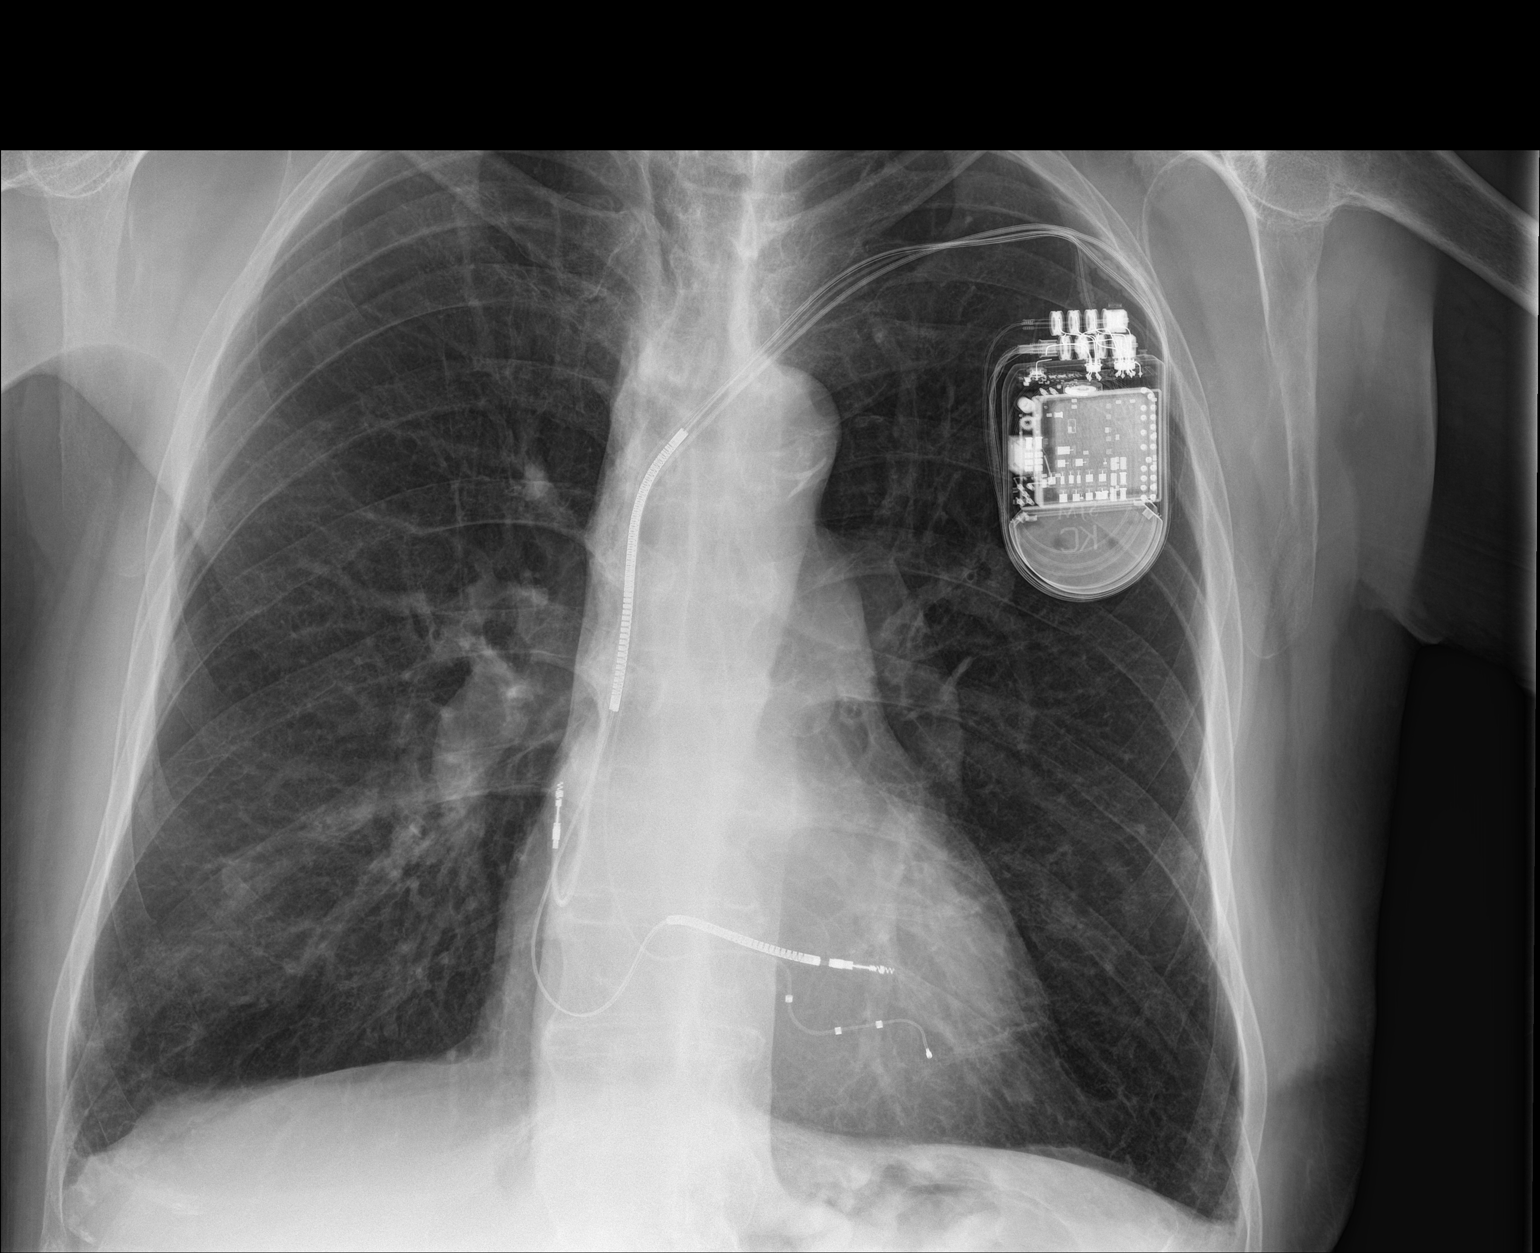

[chest lat]
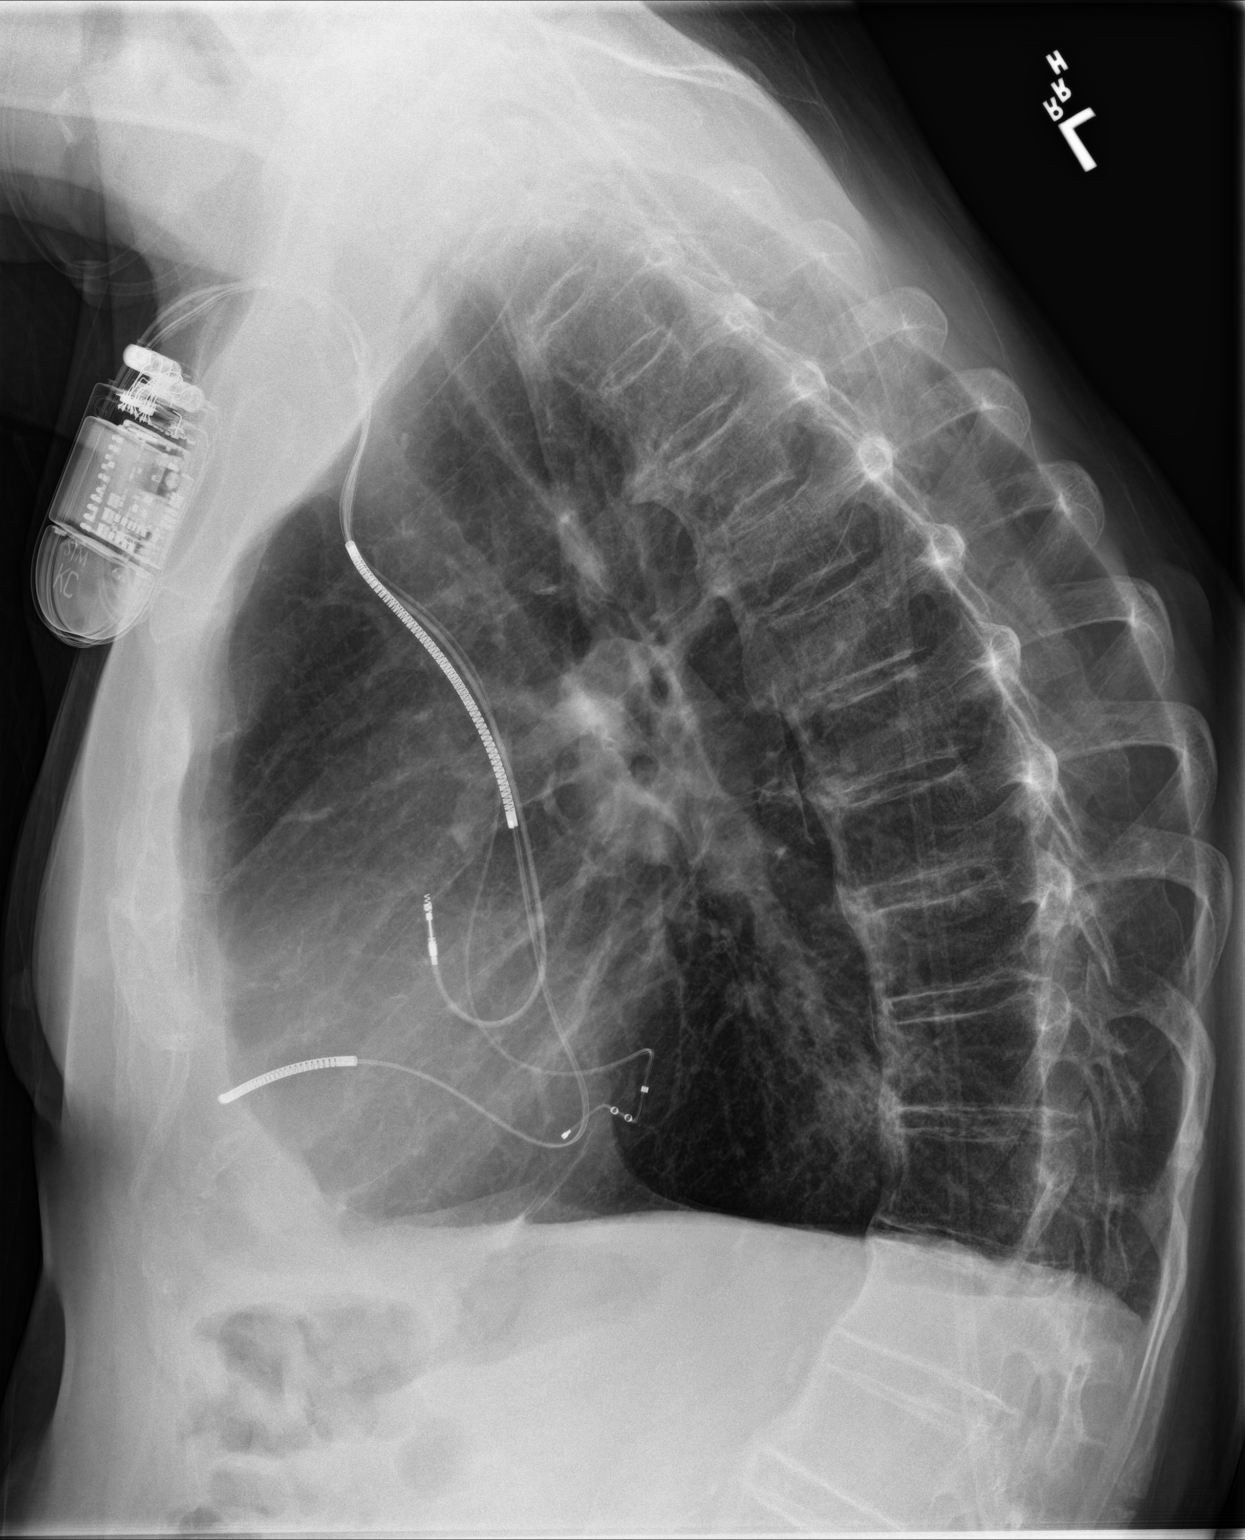

[2 of 2 positions shown; findings below may reference images not displayed]

FINDINGS: Left chest wall pacer device is identified with lead in the right
atrial appendage and right ventricle and coronary sinus. Aortic
atherosclerosis noted. The heart size is normal. No pleural effusion
or edema. There is no airspace consolidation. The lungs are
hyperinflated but clear.
IMPRESSION: 1. No acute cardiopulmonary abnormalities.
2. Aortic atherosclerosis.

## 2016-10-20 ENCOUNTER — Other Ambulatory Visit: Payer: Self-pay | Admitting: Interventional Cardiology

## 2016-10-22 ENCOUNTER — Encounter: Payer: Self-pay | Admitting: Family Medicine

## 2016-10-22 ENCOUNTER — Ambulatory Visit (INDEPENDENT_AMBULATORY_CARE_PROVIDER_SITE_OTHER): Payer: Medicare Other | Admitting: Family Medicine

## 2016-10-22 VITALS — BP 118/74 | HR 68 | Resp 20 | Ht 65.5 in | Wt 178.0 lb

## 2016-10-22 DIAGNOSIS — J302 Other seasonal allergic rhinitis: Secondary | ICD-10-CM | POA: Diagnosis not present

## 2016-10-22 DIAGNOSIS — Z23 Encounter for immunization: Secondary | ICD-10-CM | POA: Diagnosis not present

## 2016-10-22 DIAGNOSIS — B070 Plantar wart: Secondary | ICD-10-CM

## 2016-10-22 DIAGNOSIS — I10 Essential (primary) hypertension: Secondary | ICD-10-CM | POA: Diagnosis not present

## 2016-10-22 MED ORDER — FLUTICASONE PROPIONATE 50 MCG/ACT NA SUSP
2.0000 | Freq: Every evening | NASAL | 3 refills | Status: AC | PRN
Start: 2016-10-22 — End: ?

## 2016-10-22 NOTE — Patient Instructions (Signed)
Plantar Warts Plantar warts are small growths on the bottom of the foot (sole). Warts are caused by a type of germ (virus). Most warts are not painful, and they usually do not cause problems. Sometimes, plantar warts can cause pain when you walk. Warts often go away on their own in time. Treatments may be done if needed. Follow these instructions at home: General instructions  Apply creams or solutions only as told by your doctor. Follow these steps if your doctor tells you to do so: ? Soak your foot in warm water. ? Remove the top layer of softened skin before you apply the medicine. You can use a pumice stone to remove the tissue. ? After you apply the medicine, put a bandage over the area of the wart. ? Repeat the process every day or as told by your doctor.  Do not scratch or pick at a wart.  Wash your hands after you touch a wart.  If a wart is painful, try putting a bandage with a hole in the middle over the wart.  Keep all follow-up visits as told by your doctor. This is important. Prevention  Wear shoes and socks. Change socks every day.  Keep your feet clean and dry.  Check your feet often.  Avoid direct contact with warts on other people. Contact a doctor if:  Your warts do not improve after treatment.  You have redness, swelling, or pain at the site of a wart.  You have bleeding from a wart, and the bleeding does not stop when you put light pressure on the wart.  You have diabetes and you get a wart. This information is not intended to replace advice given to you by your health care provider. Make sure you discuss any questions you have with your health care provider. Document Released: 02/09/2010 Document Revised: 06/15/2015 Document Reviewed: 04/04/2014 Elsevier Interactive Patient Education  2018 Elsevier Inc.  

## 2016-10-22 NOTE — Progress Notes (Signed)
Patient ID: Troy [name], male    DOB: [birth year], 76 y.o.   MRN: [id number]  Chief Complaint  Patient presents with  . Follow-up    1 month    Allergies Benadryl [diphenhydramine hcl]  Subjective:   Troy [name] is a 76 y.o. male who presents to [facility] today.  HPI Here for follow up. Has been doing well. Sees the eye doctor, has been doing ok. Has an appointment with the eye doctor next week. Gets coumadin checked by cardiology.  Has follow up by lung specialist. Has lung and heart disease.  Eating well. Checks weight. Cooks food. Using the inhalers by the pulmonologist.  BP has been running well. Appetite and mood are good. Has a place on bottom of foot that is bothering him. Breathing is stable.  Has a place on left foot that he would like for me to look at, it is sore at times. Painful at times. Has been using a pumice stone. Nothing makes it worse. Better when uses the pumice stone. Has been there for many months. No wound or skin breakdown but the area is a little sore if stands on it for a while. Can feel it. Has been using the pumice stone and it helps.  BP has been running well.  Energy level is good.  Would like a refill on the nose spray that has used in the past for allergies. This time of the year nose starts running and throat can get itchy.     Past Medical History:  Diagnosis Date  . Atrial fibrillation ([place])   . CAD (coronary artery disease) 1996   status post PCI of the RCA   . Community acquired pneumonia 08/24/2015  . Congestive heart failure, unspecified   . COPD (chronic obstructive pulmonary disease) (HCC)    Pt on home O2 at night and PRN, unsure of date of diagnosis.  . Diabetes mellitus, type 2 ([place])   . DJD (degenerative joint disease)   . GERD (gastroesophageal reflux disease)   . Gout   . HTN (hypertension)   . 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 Nephrolithiasis   . Other primary cardiomyopathies   . Personal  history of DVT (deep vein thrombosis)   . Prostatitis   . Shingles     Past Surgical History:  Procedure Laterality Date  . BACK SURGERY    . CARDIAC DEFIBRILLATOR PLACEMENT    . CORONARY STENT PLACEMENT    . PACEMAKER INSERTION      Family History  Problem Relation Age of Onset  . Heart disease Father   . Cancer Father        LUNG  . Colon cancer Mother   . Stroke Paternal Uncle   . Heart attack Neg Hx   . Hyperlipidemia Neg Hx   . Hypertension Neg Hx      Social History   Social History  . Marital status: Single    Spouse name: N/A  . Number of children: 0  . Years of education: N/A   Occupational History  . Retired from [employer]    Social History Main Topics  . Smoking status: Former Smoker    Packs/day: 4.00    Years: 50.00    Types: Cigarettes    Quit date: 01/22/1980  . Smokeless tobacco: Never Used  . Alcohol use No     Comment: denies  . Drug use: No  . Sexual activity: No   Other Topics  Concern  . None   Social History Narrative   Lives alone.  Single.  No children.  Ambulates with a cane.  Has a deceased brother and is estranged from sisters.    Review of Systems  Constitutional: Negative for activity change, appetite change, fatigue and unexpected weight change.  Respiratory: Positive for shortness of breath. Negative for apnea, cough, wheezing and stridor.        Uses oxygen at night. Does get SOB with exertion. Sees Dr. Marshell Levan in [place] for lungs. Has been using some medications for breathing and they help. Does not get SOB at rest but does get DOE with elevation.   Cardiovascular: Negative for chest pain and leg swelling.  Genitourinary: Negative for dysuria and enuresis.  Musculoskeletal: Negative for back pain.  Neurological: Negative for syncope, speech difficulty, light-headedness, numbness and headaches.  Psychiatric/Behavioral: Negative for agitation and dysphoric mood. The patient is not nervous/anxious and is not  hyperactive.      Objective:   BP 118/74   Pulse 68   Resp 20   Ht 5' 5.5" (1.664 m)   Wt 178 lb (80.7 kg)   SpO2 95%   BMI 29.17 kg/m   Physical Exam  Constitutional: He is oriented to person, place, and time. He appears well-developed and well-nourished.  HENT:  Head: Normocephalic and atraumatic.  Eyes: Pupils are equal, round, and reactive to light.  Neck: Normal range of motion. Neck supple. No thyromegaly present.  Cardiovascular: Normal rate and regular rhythm.   Pulses:      Dorsalis pedis pulses are 1+ on the right side, and 1+ on the left side.  Pulmonary/Chest: Effort normal and breath sounds normal.  Musculoskeletal: He exhibits no edema.       Right foot: There is normal range of motion and no deformity.       Left foot: There is normal range of motion and no deformity.  Feet:  Right Foot:  Skin Integrity: Negative for ulcer, blister, skin breakdown, erythema, warmth or callus.  Left Foot:  Skin Integrity: Positive for callus.  Neurological: He is alert and oriented to person, place, and time. No cranial nerve deficit.  Skin: Skin is warm, dry and intact. No erythema.  2x2 cm area of circular callous formation on the plantar surface of the right foot. NTTP. No associated erythema or edema. No skin breakdown.   Psychiatric: He has a normal mood and affect. His behavior is normal. Thought content normal.  Vitals reviewed.    Assessment and Plan   1. Essential hypertension Stable.continue medications and follow up with cardiology.  Labs at prior OV discussed and reviewed with patient.   2. Plantar wart of left foot Discussed with patient. Defers any treatment now, but will follow up if it starts to bother him more. Has not increased in size in months. No skin breakdown but rather callous formation. Prefers to use pumice stone.   3. Seasonal allergies Will see if covered, has been on in the past and helped with allergies. IF not covered, patient will buy  OTC.  - fluticasone (FLONASE) 50 MCG/ACT nasal spray; Place 2 sprays into both nostrils at bedtime as needed for allergies.  Dispense: 16 g; Refill: 3   Return in about 3 months (around 01/22/2017) for follow up. Caren Macadam, MD 10/22/2016

## 2016-10-24 ENCOUNTER — Ambulatory Visit (INDEPENDENT_AMBULATORY_CARE_PROVIDER_SITE_OTHER): Payer: Medicare Other | Admitting: *Deleted

## 2016-10-24 DIAGNOSIS — I482 Chronic atrial fibrillation, unspecified: Secondary | ICD-10-CM

## 2016-10-24 DIAGNOSIS — I480 Paroxysmal atrial fibrillation: Secondary | ICD-10-CM | POA: Diagnosis not present

## 2016-10-24 DIAGNOSIS — Z5181 Encounter for therapeutic drug level monitoring: Secondary | ICD-10-CM

## 2016-10-24 DIAGNOSIS — I82409 Acute embolism and thrombosis of unspecified deep veins of unspecified lower extremity: Secondary | ICD-10-CM

## 2016-10-24 LAB — POCT INR: INR: 93793

## 2016-10-29 ENCOUNTER — Ambulatory Visit (INDEPENDENT_AMBULATORY_CARE_PROVIDER_SITE_OTHER): Payer: Medicare Other | Admitting: Interventional Cardiology

## 2016-10-29 ENCOUNTER — Encounter: Payer: Self-pay | Admitting: Interventional Cardiology

## 2016-10-29 VITALS — BP 132/62 | HR 70 | Ht 65.5 in | Wt 181.0 lb

## 2016-10-29 DIAGNOSIS — I482 Chronic atrial fibrillation, unspecified: Secondary | ICD-10-CM

## 2016-10-29 DIAGNOSIS — N183 Chronic kidney disease, stage 3 unspecified: Secondary | ICD-10-CM

## 2016-10-29 DIAGNOSIS — I5022 Chronic systolic (congestive) heart failure: Secondary | ICD-10-CM

## 2016-10-29 DIAGNOSIS — I10 Essential (primary) hypertension: Secondary | ICD-10-CM | POA: Diagnosis not present

## 2016-10-29 DIAGNOSIS — I25119 Atherosclerotic heart disease of native coronary artery with unspecified angina pectoris: Secondary | ICD-10-CM

## 2016-10-29 NOTE — Patient Instructions (Signed)
Medication Instructions:  Your physician recommends that you continue on your current medications as directed. Please refer to the Current Medication list given to you today.   Labwork: None ordered  Testing/Procedures: None ordered  Follow-Up: Your physician wants you to follow-up in: June 2019 with Dr. Varanasi. You will receive a reminder letter in the mail two months in advance. If you don't receive a letter, please call our office to schedule the follow-up appointment.   Any Other Special Instructions Will Be Listed Below (If Applicable).     If you need a refill on your cardiac medications before your next appointment, please call your pharmacy.   

## 2016-10-29 NOTE — Progress Notes (Signed)
Cardiology Office Note   Date:  10/29/2016   ID:  Troy [name], DOB 05-06-1940, MRN [id number]  PCP:  Caren Macadam, MD    No chief complaint on file. CAD, chronic systolic heart failure   Wt Readings from Last 3 Encounters:  10/29/16 181 lb (82.1 kg)  10/22/16 178 lb (80.7 kg)  09/19/16 178 lb (80.7 kg)       History of Present Illness: Troy [name] is a 76 y.o. male  who presents for chronic systolic HF.   He has a mixed CM, chronic class 2 CHF, s/p biv-ICD-PLACED 2012, chb, and PAF. CAD, PCI of RCA in 1996 .   Echo 11/22/13 with EF 20-25%.   Hospitalized in 12/16 for acute on chronic systolic HF. Was neg 5 L with diuresis- discharged with oxygen and to SNF for rehab.   Heart failure managed medically.  Digoxin level of 1.8 was associated with nausea.   Hospitalized in 8/17 for presumed Lower GI bleed form diverticulosis.  No colonoscopy was done.    ECHO in 2018 for branch retinal occlusion showed EF 35-40%.  Denies : Chest pain. Dizziness. Leg edema. Nitroglycerin use. Orthopnea. Palpitations. Paroxysmal nocturnal dyspnea. Shortness of breath. Syncope.   He has some joint pains.  He did not have to move.   His weight at home has been stable.  He is using [place] oxygen at night.       Past Medical History:  Diagnosis Date  . Atrial fibrillation ([place])   . CAD (coronary artery disease) 1996   status post PCI of the RCA   . Community acquired pneumonia 08/24/2015  . Congestive heart failure, unspecified   . COPD (chronic obstructive pulmonary disease) (HCC)    Pt on home O2 at night and PRN, unsure of date of diagnosis.  . Diabetes mellitus, type 2 ([place])   . DJD (degenerative joint disease)   . GERD (gastroesophageal reflux disease)   . Gout   . HTN (hypertension)   . 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 Nephrolithiasis   . Other primary cardiomyopathies   . Personal history of DVT (deep vein thrombosis)   . Prostatitis   .  Shingles     Past Surgical History:  Procedure Laterality Date  . BACK SURGERY    . CARDIAC DEFIBRILLATOR PLACEMENT    . CORONARY STENT PLACEMENT    . PACEMAKER INSERTION       Current Outpatient Prescriptions  Medication Sig Dispense Refill  . acetaminophen (TYLENOL) 500 MG tablet Take 500 mg by mouth every 6 (six) hours as needed for mild pain or moderate pain.    Marland Kitchen albuterol (ACCUNEB) 1.25 MG/3ML nebulizer solution Take 3 mLs by nebulization 4 (four) times daily.  2  . albuterol (PROAIR HFA) 108 (90 Base) MCG/ACT inhaler Inhale 1-2 puffs into the lungs every 6 (six) hours as needed for wheezing or shortness of breath. 8.5 g 1  . alfuzosin (UROXATRAL) 10 MG 24 hr tablet Take 10 mg by mouth daily with breakfast.     . allopurinol (ZYLOPRIM) 100 MG tablet Take 100 mg by mouth daily.    Marland Kitchen atorvastatin (LIPITOR) 10 MG tablet Take 1 tablet (10 mg total) by mouth at bedtime. 90 tablet 1  . carvedilol (COREG) 12.5 MG tablet Take 1 tablet (12.5 mg total) by mouth 2 (two) times daily with a meal. 60 tablet 6  . cetirizine (ZYRTEC) 10 MG tablet Take 10 mg by mouth daily.    Marland Kitchen  digoxin (LANOXIN) 0.125 MG tablet Take 0.5 tablets (0.0625 mg total) by mouth daily. 45 tablet 3  . fluticasone (FLONASE) 50 MCG/ACT nasal spray Place 2 sprays into both nostrils at bedtime as needed for allergies. 16 g 3  . furosemide (LASIX) 40 MG tablet Take 1.5 tablets (60 mg total) by mouth 2 (two) times daily. 90 tablet 11  . guaiFENesin (MUCINEX) 600 MG 12 hr tablet Take 1 tablet (600 mg total) by mouth 2 (two) times daily. 30 tablet 0  . mometasone-formoterol (DULERA) 100-5 MCG/ACT AERO Inhale 2 puffs into the lungs 2 (two) times daily. 1 Inhaler 2  . tiotropium (SPIRIVA) 18 MCG inhalation capsule Place 1 capsule (18 mcg total) into inhaler and inhale daily. 30 capsule 12  . warfarin (COUMADIN) 1 MG tablet TAKE 2MG  BY MOUTH EVERY DAY EXCEPT MONDAY, -TAKE 3MG  AS DIRECTED BY CLINIC 70 tablet 4   No current  facility-administered medications for this visit.     Allergies:   Benadryl [diphenhydramine hcl]    Social History:  The patient  reports that he quit smoking about 36 years ago. His smoking use included Cigarettes. He has a 200.00 pack-year smoking history. He has never used smokeless tobacco. He reports that he does not drink alcohol or use drugs.   Family History:  The patient's family history includes Cancer in his father; Colon cancer in his mother; Heart disease in his father; Stroke in his paternal uncle.    ROS:  Please see the history of present illness.   Otherwise, review of systems are positive for joint pains.   All other systems are reviewed and negative.    PHYSICAL EXAM: VS:  BP 132/62   Pulse 70   Ht 5' 5.5" (1.664 m)   Wt 181 lb (82.1 kg)   SpO2 97%   BMI 29.66 kg/m  , BMI Body mass index is 29.66 kg/m. GEN: Well nourished, well developed, in no acute distress  HEENT: normal  Neck: no JVD, carotid bruits, or masses Cardiac: RRR; no murmurs, rubs, or gallops,no edema  Respiratory:  clear to auscultation bilaterally, normal work of breathing GI: soft, nontender, nondistended, + BS MS: no deformity or atrophy  Skin: warm and dry, no rash Neuro:  Strength and sensation are intact Psych: euthymic mood, full affect   EKG:   The ekg ordered today demonstrates paced rhythm in 7/18   Recent Labs: 06/09/2016: TSH 2.153 06/10/2016: Magnesium 1.9 08/14/2016: B Natriuretic Peptide 793.0 09/19/2016: ALT 7 09/25/2016: BUN 23; Creat 1.63; Hemoglobin 13.1; Platelets 133; Potassium 3.9; Sodium 143   Lipid Panel    Component Value Date/Time   CHOL 135 09/19/2016 1454   TRIG 168 (H) 09/19/2016 1454   HDL 42 09/19/2016 1454   CHOLHDL 3.2 09/19/2016 1454   VLDL 34 (H) 09/19/2016 1454   LDLCALC 59 09/19/2016 1454     Other studies Reviewed: Additional studies/ records that were reviewed today with results demonstrating: echo showed that EF improved in  2018.   ASSESSMENT AND PLAN:  1. Chronic systolic heart failure:  Appears euvolemic.  Continue current medications. Most recent echocardiogram showed some improvement in LV systolic function. 2. CAD: No angina. Continue aggressive secondary prevention. 3. CRI:  Class III renal insufficiency. Stable back in early September. 4. AFib: Warfarin for anticoagulation and stroke prevention. 5. Device check with Dr. Lovena Le. This is coming up in a few months.   Current medicines are reviewed at length with the patient today.  The patient concerns regarding his  medicines were addressed.  The following changes have been made:  No change  Labs/ tests ordered today include:  No orders of the defined types were placed in this encounter.   Recommend 150 minutes/week of aerobic exercise Low fat, low carb, high fiber diet recommended  Disposition:   FU in 1 year   Signed, Larae Grooms, MD  10/29/2016 1:36 PM    [facility] [place], [place], [place]  [address] Phone: [phone]; Fax: [phone]

## 2016-10-30 ENCOUNTER — Other Ambulatory Visit: Payer: Self-pay | Admitting: Interventional Cardiology

## 2016-10-30 MED ORDER — WARFARIN SODIUM 1 MG PO TABS: ORAL_TABLET | ORAL | 3 refills | Status: DC

## 2016-10-30 NOTE — Telephone Encounter (Signed)
°  Patient walked into the [facility] requesting a refill.   1. Which medications need to be refilled? (please list name of each medication and dose if known) Warfarin 1 mg   2. Which pharmacy/location (including street and city if local pharmacy) is medication to be sent to [facility]  3. Do they need a 30 day or 90 day supply?

## 2016-11-04 ENCOUNTER — Encounter: Payer: Self-pay | Admitting: Emergency Medicine

## 2016-11-04 ENCOUNTER — Ambulatory Visit (INDEPENDENT_AMBULATORY_CARE_PROVIDER_SITE_OTHER): Payer: Medicare Other | Admitting: Emergency Medicine

## 2016-11-04 DIAGNOSIS — J449 Chronic obstructive pulmonary disease, unspecified: Secondary | ICD-10-CM

## 2016-11-04 DIAGNOSIS — R490 Dysphonia: Secondary | ICD-10-CM

## 2016-11-04 NOTE — Assessment & Plan Note (Signed)
Continue to treat allergic rhinitis. If no improvement then consider empiric GERD therapy (he denies any GERD symptoms). Finally if persistent I will refer him to ENT for an upper airway exam.

## 2016-11-04 NOTE — Assessment & Plan Note (Signed)
We will temporarily stop Spiriva and Dulera. Start taking Trelegy one puff once a day. Remember to rinse and gargle after using this medication Keep albuterol available to use 2 puffs if needed for shortness of breath Please continue Zyrtec, fluticasone nasal spray, Mucinex as you have been taking them Flu shot up-to-date We will perform walking oximetry on room air at your next visit Follow with Dr Lamonte Sakai next available to assess your status on the new medication. If it is beneficial then we will order through your pharmacy.

## 2016-11-04 NOTE — Patient Instructions (Addendum)
We will temporarily stop Spiriva and Dulera. Start taking Trelegy one puff once a day. Remember to rinse and gargle after using this medication Keep albuterol available to use 2 puffs if needed for shortness of breath Please continue Zyrtec, fluticasone nasal spray, Mucinex as you have been taking them Flu shot up-to-date We will perform walking oximetry on room air at your next visit Follow with Dr Lamonte Sakai next available to assess your status on the new medication. If it is beneficial then we will order through your pharmacy.

## 2016-11-04 NOTE — Progress Notes (Signed)
Subjective:    Patient ID: ALIC HILBURN, male    DOB: Jan 31, 1940, 76 y.o.   MRN: [id number]  HPI 76 yo former smoker (100 pk-yrs), hx CAD and dilated CM, A Fib, DM, DVT's, AICD device. Dx w COPD several years ago. Referred by Dr Deforest Hoyles for dyspnea, cough and congestion.  He can only walk 25 feet. He occasionally wheezes.  ROV 03/12/16 -- This follow-up visit to reestablish care. The patient has a history of very severe COPD. I reviewed his pulmonary function testing from 07/29/13. These show hyperinflation with an FEV1 of 1.11 L (44% predicted), decreased diffusion capacity.  He reports that his breathing has ups and downs. Has occasionally has dyspnea that is responsive to diuretics. He reports occasional wheeze, cough. He has baseline exertional dyspnea. Recent worsening with admission to [facility] at which time he was treated w diuresis and also with corticosteroids. He was wheezing at the time per the d/c summary. He tells me that he is no longer on Anoro, is currently on Spiriva + Dulera. He cannot remember when this was changed. He believes that the Spiriva is new from the recent hospitalization.   ROV 11/04/16 -- 76 year old man with a history of heavy tobacco use and severe obstructive lung disease. He also has coronary disease, atrial fibrillation, dilated cardiomyopathy with AICD, diabetes. He returns today for regular follow-up of his COPD. Apparently had exertional hypoxemia on ambulation in our office but he does not have home oxygen. He is currently managed on Spiriva and Dulera. He uses albuterol approximately 1-2x a week. He believes that his breathing has been stable since last time - he get SOB with carrying heavy objects, sometimes with walking a longer distance. He is having some increased hoarseness, no cough. Happens with a lot of talking. Remains on Zyrtec, fluticasone nasal spray in the evening. Take mucinex bid, seems to help with mucous clearance from his chest. Denies any GERD sx.   Doesn't relate the hoarseness to his BD's. He has had his flu shot.     Review of Systems  Constitutional: Negative for fever and unexpected weight change.  HENT: Negative for congestion, dental problem, ear pain, nosebleeds, postnasal drip, rhinorrhea, sinus pressure, sneezing, sore throat and trouble swallowing.   Eyes: Negative for redness and itching.  Respiratory: Positive for cough and shortness of breath. Negative for chest tightness and wheezing.        Congestion  Cardiovascular: Negative for palpitations and leg swelling.  Gastrointestinal: Negative for nausea and vomiting.  Genitourinary: Negative for dysuria.  Musculoskeletal: Negative for joint swelling.  Skin: Negative for rash.  Neurological: Negative for headaches.  Hematological: Does not bruise/bleed easily.  Psychiatric/Behavioral: Negative for dysphoric mood. The patient is not nervous/anxious.       Objective:   Physical Exam Vitals:   11/04/16 1202  BP: 110/66  Pulse: 66  SpO2: 95%  Weight: 177 lb (80.3 kg)  Height: 5\' 5"  (1.651 m)  Gen: Pleasant, well-nourished, in no distress,  normal affect  ENT: No lesions,  mouth clear,  oropharynx clear, disconjugate gaze, no postnasal drip  Neck: No JVD, no stridor  Lungs: normal excursion, somewhat hyperinflated, no wheeze or crackles.   Cardiovascular: RRR, heart sounds normal, no murmur or gallops, no peripheral edema  Musculoskeletal: No deformities, no cyanosis or clubbing  Neuro: alert, non focal  Skin: Warm, no lesions or rashes     Assessment & Plan:  COPD (chronic obstructive pulmonary disease) (HCC) We will temporarily stop Spiriva  and Dulera. Start taking Trelegy one puff once a day. Remember to rinse and gargle after using this medication Keep albuterol available to use 2 puffs if needed for shortness of breath Please continue Zyrtec, fluticasone nasal spray, Mucinex as you have been taking them Flu shot up-to-date We will perform walking  oximetry on room air at your next visit Follow with Dr Lamonte Sakai next available to assess your status on the new medication. If it is beneficial then we will order through your pharmacy.  Hoarseness Continue to treat allergic rhinitis. If no improvement then consider empiric GERD therapy (he denies any GERD symptoms). Finally if persistent I will refer him to ENT for an upper airway exam.  Baltazar Apo, MD, PhD 11/04/2016, 12:20 PM [facility] [phone] or if no answer [phone]

## 2016-11-25 ENCOUNTER — Telehealth (INDEPENDENT_AMBULATORY_CARE_PROVIDER_SITE_OTHER): Payer: Medicare Other

## 2016-11-25 DIAGNOSIS — I480 Paroxysmal atrial fibrillation: Secondary | ICD-10-CM

## 2016-11-25 DIAGNOSIS — I82409 Acute embolism and thrombosis of unspecified deep veins of unspecified lower extremity: Secondary | ICD-10-CM

## 2016-11-25 DIAGNOSIS — I482 Chronic atrial fibrillation, unspecified: Secondary | ICD-10-CM

## 2016-11-25 NOTE — Telephone Encounter (Signed)
Patient walked into office today stating that he has been on Prednisone since last Thursday for gout.  States that he is on blood thinner and wanted to have it checked.  States that he does not PCP.

## 2016-11-25 NOTE — Addendum Note (Signed)
Addended by: Acquanetta Chain on: 11/25/2016 01:06 PM   Modules accepted: Orders

## 2016-11-25 NOTE — Telephone Encounter (Signed)
Orders given to patient to go to lab to have drawn

## 2016-11-28 ENCOUNTER — Ambulatory Visit (INDEPENDENT_AMBULATORY_CARE_PROVIDER_SITE_OTHER): Payer: Medicare Other | Admitting: *Deleted

## 2016-11-28 DIAGNOSIS — I482 Chronic atrial fibrillation, unspecified: Secondary | ICD-10-CM

## 2016-11-28 DIAGNOSIS — Z5181 Encounter for therapeutic drug level monitoring: Secondary | ICD-10-CM | POA: Diagnosis not present

## 2016-11-28 LAB — POCT INR: INR: 2.8

## 2016-11-29 ENCOUNTER — Other Ambulatory Visit: Payer: Self-pay | Admitting: Family Medicine

## 2016-11-29 MED ORDER — ALLOPURINOL 100 MG PO TABS: 100.0000 mg | ORAL_TABLET | Freq: Every day | ORAL | 0 refills | Status: AC

## 2016-11-29 MED ORDER — DIGOXIN 125 MCG PO TABS: 0.0625 mg | ORAL_TABLET | Freq: Every day | ORAL | 0 refills | Status: DC

## 2016-11-29 NOTE — Telephone Encounter (Signed)
Meds pended for  Signature.

## 2016-11-29 NOTE — Telephone Encounter (Signed)
Pt needs refill on Digoxin and Allopurinol - he uses [facility] [phone] - Pt came by office to request rx

## 2016-12-05 ENCOUNTER — Encounter: Payer: Self-pay | Admitting: Emergency Medicine

## 2016-12-05 ENCOUNTER — Ambulatory Visit (INDEPENDENT_AMBULATORY_CARE_PROVIDER_SITE_OTHER): Payer: Medicare Other | Admitting: Emergency Medicine

## 2016-12-05 DIAGNOSIS — J301 Allergic rhinitis due to pollen: Secondary | ICD-10-CM

## 2016-12-05 DIAGNOSIS — R0902 Hypoxemia: Secondary | ICD-10-CM | POA: Diagnosis not present

## 2016-12-05 DIAGNOSIS — J449 Chronic obstructive pulmonary disease, unspecified: Secondary | ICD-10-CM | POA: Diagnosis not present

## 2016-12-05 DIAGNOSIS — J309 Allergic rhinitis, unspecified: Secondary | ICD-10-CM | POA: Insufficient documentation

## 2016-12-05 MED ORDER — FLUTICASONE-UMECLIDIN-VILANT 100-62.5-25 MCG/INH IN AEPB: 1.0000 | INHALATION_SPRAY | Freq: Every day | RESPIRATORY_TRACT | 0 refills | Status: DC

## 2016-12-05 MED ORDER — FLUTICASONE-UMECLIDIN-VILANT 100-62.5-25 MCG/INH IN AEPB: 1.0000 | INHALATION_SPRAY | Freq: Every day | RESPIRATORY_TRACT | 3 refills | Status: DC

## 2016-12-05 NOTE — Assessment & Plan Note (Signed)
He seems to have benefited from the Trelegy.  I will order through his pharmacy, stop Spiriva and Dulera.  If cost is prohibitive then we will try to get prior authorization since he is benefiting more from this medication.  He is using albuterol as needed.  Flu shot up-to-date

## 2016-12-05 NOTE — Progress Notes (Signed)
Subjective:    Patient ID: Troy [name], male    DOB: [birth year], 76 y.o.   MRN: [id number]  HPI 76 yo former smoker (100 pk-yrs), hx CAD and dilated CM, A Fib, DM, DVT's, AICD device. Dx w COPD several years ago. Referred by Dr Deforest Hoyles for dyspnea, cough and congestion.  He can only walk 25 feet. He occasionally wheezes.  ROV 03/12/16 -- This follow-up visit to reestablish care. The patient has a history of very severe COPD. I reviewed his pulmonary function testing from 07/29/13. These show hyperinflation with an FEV1 of 1.11 L (44% predicted), decreased diffusion capacity.  He reports that his breathing has ups and downs. Has occasionally has dyspnea that is responsive to diuretics. He reports occasional wheeze, cough. He has baseline exertional dyspnea. Recent worsening with admission to [facility] at which time he was treated w diuresis and also with corticosteroids. He was wheezing at the time per the d/c summary. He tells me that he is no longer on Anoro, is currently on Spiriva + Dulera. He cannot remember when this was changed. He believes that the Spiriva is new from the recent hospitalization.   ROV 11/04/16 -- 76 year old man with a history of heavy tobacco use and severe obstructive lung disease. He also has coronary disease, atrial fibrillation, dilated cardiomyopathy with AICD, diabetes. He returns today for regular follow-up of his COPD. Apparently had exertional hypoxemia on ambulation in our office but he does not have home oxygen. He is currently managed on Spiriva and Dulera. He uses albuterol approximately 1-2x a week. He believes that his breathing has been stable since last time - he get SOB with carrying heavy objects, sometimes with walking a longer distance. He is having some increased hoarseness, no cough. Happens with a lot of talking. Remains on Zyrtec, fluticasone nasal spray in the evening. Take mucinex bid, seems to help with mucous clearance from his chest. Denies any GERD sx.   Doesn't relate the hoarseness to his BD's. He has had his flu shot.    ROV 12/05/16 --this is a follow-up visit.  Patient is 76 with a history of severe obstructive lung disease, coronary disease, atrial fibrillation with a dilated cardiomyopathy and AICD, diabetes.  I saw him one month ago at which time we stop Spiriva and [facility] and started him on Trelegy.  He remains on Zyrtec, fluticasone nasal spray, Mucinex for allergic rhinitis. He tells me that he feels that he is benefiting from the Trelegy. He has albuterol, HFA and nebs, uses about 2x a day. He has O2 that he uses at night, not w exertion- uses prn for SOB.    Review of Systems  Constitutional: Negative for fever and unexpected weight change.  HENT: Negative for congestion, dental problem, ear pain, nosebleeds, postnasal drip, rhinorrhea, sinus pressure, sneezing, sore throat and trouble swallowing.   Eyes: Negative for redness and itching.  Respiratory: Positive for cough and shortness of breath. Negative for chest tightness and wheezing.        Congestion  Cardiovascular: Negative for palpitations and leg swelling.  Gastrointestinal: Negative for nausea and vomiting.  Genitourinary: Negative for dysuria.  Musculoskeletal: Negative for joint swelling.  Skin: Negative for rash.  Neurological: Negative for headaches.  Hematological: Does not bruise/bleed easily.  Psychiatric/Behavioral: Negative for dysphoric mood. The patient is not nervous/anxious.       Objective:   Physical Exam Vitals:   12/05/16 1211  BP: 118/72  Pulse: 80  SpO2: 97%  Weight: 176  lb (79.8 kg)  Height: 5\' 5"  (1.651 m)  Gen: Pleasant, well-nourished, in no distress,  normal affect  ENT: No lesions,  mouth clear,  oropharynx clear, disconjugate gaze, no postnasal drip  Neck: No JVD, no stridor  Lungs: normal excursion, somewhat hyperinflated, no wheeze or crackles.   Cardiovascular: RRR, heart sounds normal, no murmur or gallops, no peripheral  edema  Musculoskeletal: No deformities, no cyanosis or clubbing  Neuro: alert, non focal  Skin: Warm, no lesions or rashes     Assessment & Plan:  Allergic rhinitis Adequate control on his current regimen of Zyrtec, fluticasone nasal spray, Mucinex.  COPD (chronic obstructive pulmonary disease) ([place]) He seems to have benefited from the Trelegy.  I will order through his pharmacy, stop Spiriva and Dulera.  If cost is prohibitive then we will try to get prior authorization since he is benefiting more from this medication.  He is using albuterol as needed.  Flu shot up-to-date  Hypoxia Walking oximetry today to ensure that he is not having occult desaturations.  He only rarely uses his oxygen with exertion.  He uses it principally with sleep.  Baltazar Apo, MD, PhD 12/05/2016, 12:44 PM [facility] [phone] or if no answer [phone]

## 2016-12-05 NOTE — Addendum Note (Signed)
Addended by: Jannette Spanner on: 12/05/2016 12:56 PM   Modules accepted: Orders

## 2016-12-05 NOTE — Assessment & Plan Note (Signed)
Adequate control on his current regimen of Zyrtec, fluticasone nasal spray, Mucinex.

## 2016-12-05 NOTE — Assessment & Plan Note (Signed)
Walking oximetry today to ensure that he is not having occult desaturations.  He only rarely uses his oxygen with exertion.  He uses it principally with sleep.

## 2016-12-05 NOTE — Patient Instructions (Signed)
Walking oximetry on room air today We will order Trelegy through your pharmacy.  Use 1 inhalation once a day.  Please let us know if the cost of the medication is too high.  If so we will either work on authorization with her insurance company or change to an alternative. Use either pro-air 2 puffs or albuterol nebulized as needed for shortness of breath, wheezing. Flu shot up-to-date Continue your other medications as you have been taking them Follow with Dr Lamonte Sakai in 6 months or sooner if you have any problems

## 2016-12-24 ENCOUNTER — Encounter: Payer: Self-pay | Admitting: Internal Medicine

## 2016-12-24 ENCOUNTER — Ambulatory Visit (INDEPENDENT_AMBULATORY_CARE_PROVIDER_SITE_OTHER): Payer: Medicare Other | Admitting: Internal Medicine

## 2016-12-24 VITALS — BP 140/66 | HR 65 | Ht 65.5 in | Wt 180.2 lb

## 2016-12-24 DIAGNOSIS — Z95810 Presence of automatic (implantable) cardiac defibrillator: Secondary | ICD-10-CM

## 2016-12-24 DIAGNOSIS — I480 Paroxysmal atrial fibrillation: Secondary | ICD-10-CM | POA: Diagnosis not present

## 2016-12-24 DIAGNOSIS — I5022 Chronic systolic (congestive) heart failure: Secondary | ICD-10-CM

## 2016-12-24 NOTE — Patient Instructions (Signed)
Medication Instructions:  Your physician recommends that you continue on your current medications as directed. Please refer to the Current Medication list given to you today.  Labwork: None ordered.  Testing/Procedures: None ordered.  Follow-Up: Your physician wants you to follow-up in: one year with Dr. Lovena Le.   You will receive a reminder letter in the mail two months in advance. If you don't receive a letter, please call our office to schedule the follow-up appointment.  Remote monitoring is used to monitor your ICD from home. This monitoring reduces the number of office visits required to check your device to one time per year. It allows [place] to keep an eye on the functioning of your device to ensure it is working properly. You are scheduled for a device check from home on 03/25/2017. You may send your transmission at any time that day. If you have a wireless device, the transmission will be sent automatically. After your physician reviews your transmission, you will receive a postcard with your next transmission date.    Any Other Special Instructions Will Be Listed Below (If Applicable).     If you need a refill on your cardiac medications before your next appointment, please call your pharmacy.

## 2016-12-24 NOTE — Progress Notes (Signed)
HPI Mr. Hemmerich returns today for ongoing evaluation and management of his biventricular ICD in the setting of chronic systolic heart failure.  He is a very pleasant 76 year old man with the above problems, and underwent ICD insertion over 6 years ago.  Since I saw the patient last a year ago he has been stable.  He has not been in the hospital, and he denies chest pain, shortness of breath, syncope, or symptomatic ICD therapy.  He does have asymptomatic paroxysmal atrial fibrillation and has been on systemic anticoagulation for thromboembolic prevention.   Allergies  Allergen Reactions  . Benadryl [Diphenhydramine Hcl] Nausea And Vomiting     Current Outpatient Medications  Medication Sig Dispense Refill  . acetaminophen (TYLENOL) 500 MG tablet Take 500 mg by mouth every 6 (six) hours as needed for mild pain or moderate pain.    Marland Kitchen albuterol (ACCUNEB) 1.25 MG/3ML nebulizer solution Take 3 mLs by nebulization 4 (four) times daily.  2  . albuterol (PROAIR HFA) 108 (90 Base) MCG/ACT inhaler Inhale 1-2 puffs into the lungs every 6 (six) hours as needed for wheezing or shortness of breath. 8.5 g 1  . alfuzosin (UROXATRAL) 10 MG 24 hr tablet Take 10 mg by mouth daily with breakfast.     . allopurinol (ZYLOPRIM) 100 MG tablet Take 1 tablet (100 mg total) daily by mouth. 90 tablet 0  . atorvastatin (LIPITOR) 10 MG tablet Take 1 tablet (10 mg total) by mouth at bedtime. 90 tablet 1  . carvedilol (COREG) 12.5 MG tablet Take 1 tablet (12.5 mg total) by mouth 2 (two) times daily with a meal. 60 tablet 6  . cetirizine (ZYRTEC) 10 MG tablet Take 10 mg by mouth daily.    . digoxin (LANOXIN) 0.125 MG tablet Take 0.5 tablets (0.0625 mg total) daily by mouth. 45 tablet 0  . fluticasone (FLONASE) 50 MCG/ACT nasal spray Place 2 sprays into both nostrils at bedtime as needed for allergies. 16 g 3  . Fluticasone-Umeclidin-Vilant (TRELEGY ELLIPTA) 100-62.5-25 MCG/INH AEPB Inhale 1 puff daily into the lungs. 1  each 0  . furosemide (LASIX) 40 MG tablet Take 1.5 tablets (60 mg total) by mouth 2 (two) times daily. 90 tablet 11  . guaiFENesin (MUCINEX) 600 MG 12 hr tablet Take 1 tablet (600 mg total) by mouth 2 (two) times daily. 30 tablet 0  . warfarin (COUMADIN) 1 MG tablet Take as directed by [facility] 70 tablet 3   No current facility-administered medications for this visit.      Past Medical History:  Diagnosis Date  . Atrial fibrillation ([place])   . CAD (coronary artery disease) 1996   status post PCI of the RCA   . Community acquired pneumonia 08/24/2015  . Congestive heart failure, unspecified   . COPD (chronic obstructive pulmonary disease) ([facility])    Pt on home O2 at night and PRN, unsure of date of diagnosis.  . Diabetes mellitus, type 2 ([place])   . DJD (degenerative joint disease)   . GERD (gastroesophageal reflux disease)   . Gout   . HTN (hypertension)   . Ischemic dilated cardiomyopathy ([place])   . Nephrolithiasis   . Other primary cardiomyopathies   . Personal history of DVT (deep vein thrombosis)   . Prostatitis   . Shingles     ROS:   All systems reviewed and negative except as noted in the HPI.   Past Surgical History:  Procedure Laterality Date  . BACK SURGERY    .  CARDIAC DEFIBRILLATOR PLACEMENT    . CORONARY STENT PLACEMENT    . PACEMAKER INSERTION       Family History  Problem Relation Age of Onset  . Heart disease Father   . Cancer Father        LUNG  . Colon cancer Mother   . Stroke Paternal Uncle   . Heart attack Neg Hx   . Hyperlipidemia Neg Hx   . Hypertension Neg Hx      Social History   Socioeconomic History  . Marital status: Single    Spouse name: Not on file  . Number of children: 0  . Years of education: Not on file  . Highest education level: Not on file  Social Needs  . Financial resource strain: Not on file  . Food insecurity - worry: Not on file  . Food insecurity - inability: Not on file  . Transportation needs - medical:  Not on file  . Transportation needs - non-medical: Not on file  Occupational History  . Occupation: Retired from Chief of Staff  . Smoking status: Former Smoker    Packs/day: 4.00    Years: 50.00    Pack years: 200.00    Types: Cigarettes    Last attempt to quit: 01/22/1980    Years since quitting: 36.9  . Smokeless tobacco: Never Used  Substance and Sexual Activity  . Alcohol use: No    Alcohol/week: 0.0 oz    Comment: denies  . Drug use: No  . Sexual activity: No  Other Topics Concern  . Not on file  Social History Narrative   Lives alone.  Single.  No children.  Ambulates with a cane.  Has a deceased brother and is estranged from sisters.     BP 140/66   Pulse 65   Ht 5' 5.5" (1.664 m)   Wt 180 lb 3.2 oz (81.7 kg)   SpO2 98%   BMI 29.53 kg/m   Physical Exam:  Well appearing NAD HEENT: Unremarkable Neck: 6cm, with no wheezes, rales, or rhonchi JVD, no thyromegally Lymphatics:  No adenopathy Back:  No CVA tenderness Lungs:  Clear HEART:  Regular rate rhythm, no murmurs, no rubs, no clicks Abd:  soft, positive bowel sounds, no organomegally, no rebound, no guarding Ext:  2 plus pulses, no edema, no cyanosis, no clubbing Skin:  No rashes no nodules Neuro:  CN II through XII intact, motor grossly intact   DEVICE  Normal device function.  See PaceArt for details.   Assess/Plan: 1.  Chronic systolic heart failure -his symptoms are class II.  He will continue his current medical therapy. 2.  ICD - Saint Jude biventricular ICD is working normally.  He has approximately 2 years on the battery. He has had no ventricular arrhythmias. 3.  Paroxysmal atrial fibrillation -he is maintaining sinus rhythm over 99% of the time.  He will continue his current medications.  Crissie Sickles, MD

## 2017-01-09 ENCOUNTER — Ambulatory Visit (INDEPENDENT_AMBULATORY_CARE_PROVIDER_SITE_OTHER): Payer: Medicare Other | Admitting: *Deleted

## 2017-01-09 DIAGNOSIS — I480 Paroxysmal atrial fibrillation: Secondary | ICD-10-CM | POA: Diagnosis not present

## 2017-01-09 DIAGNOSIS — I482 Chronic atrial fibrillation, unspecified: Secondary | ICD-10-CM

## 2017-01-09 DIAGNOSIS — I82409 Acute embolism and thrombosis of unspecified deep veins of unspecified lower extremity: Secondary | ICD-10-CM | POA: Diagnosis not present

## 2017-01-09 DIAGNOSIS — Z5181 Encounter for therapeutic drug level monitoring: Secondary | ICD-10-CM | POA: Diagnosis not present

## 2017-01-09 LAB — POCT INR: INR: 3.7

## 2017-01-22 ENCOUNTER — Ambulatory Visit: Payer: Medicare Other | Admitting: Family Medicine

## 2017-01-22 LAB — CUP PACEART INCLINIC DEVICE CHECK
Battery Remaining Longevity: 25 mo
Battery Remaining Percentage: 29 %
Brady Statistic RA Percent Paced: 68 %
Brady Statistic RV Percent Paced: 94 %
Date Time Interrogation Session: 20190102121902
HIGH POWER IMPEDANCE MEASURED VALUE: 47 Ohm
Implantable Lead Implant Date: 20120801
Implantable Lead Implant Date: 20120801
Implantable Lead Location: 753860
Lead Channel Impedance Value: 580 Ohm
Lead Channel Pacing Threshold Amplitude: 0.75 V
Lead Channel Pacing Threshold Amplitude: 0.75 V
Lead Channel Pacing Threshold Pulse Width: 0.5 ms
Lead Channel Sensing Intrinsic Amplitude: 3.8 mV
Lead Channel Setting Pacing Amplitude: 1.75 V
Lead Channel Setting Pacing Amplitude: 2 V
Lead Channel Setting Pacing Amplitude: 2 V
Lead Channel Setting Pacing Pulse Width: 0.5 ms
MDC IDC LEAD IMPLANT DT: 20120801
MDC IDC LEAD LOCATION: 753858
MDC IDC LEAD LOCATION: 753859
MDC IDC MSMT LEADCHNL LV IMPEDANCE VALUE: 840 Ohm
MDC IDC MSMT LEADCHNL LV PACING THRESHOLD AMPLITUDE: 1 V
MDC IDC MSMT LEADCHNL LV PACING THRESHOLD PULSEWIDTH: 0.5 ms
MDC IDC MSMT LEADCHNL RA IMPEDANCE VALUE: 440 Ohm
MDC IDC MSMT LEADCHNL RA PACING THRESHOLD PULSEWIDTH: 0.5 ms
MDC IDC PG IMPLANT DT: 20120801
MDC IDC PG SERIAL: 7001284
MDC IDC SET LEADCHNL LV PACING PULSEWIDTH: 0.5 ms
MDC IDC SET LEADCHNL RV SENSING SENSITIVITY: 0.5 mV

## 2017-01-30 ENCOUNTER — Other Ambulatory Visit: Payer: Self-pay

## 2017-01-30 ENCOUNTER — Emergency Department (HOSPITAL_COMMUNITY)
Admission: EM | Admit: 2017-01-30 | Discharge: 2017-01-30 | Disposition: A | Discharge status: Home / Self Care | Payer: Medicare Other | Source: Home / Self Care

## 2017-01-30 ENCOUNTER — Encounter (HOSPITAL_COMMUNITY): Payer: Self-pay

## 2017-01-30 DIAGNOSIS — R2689 Other abnormalities of gait and mobility: Secondary | ICD-10-CM

## 2017-01-30 DIAGNOSIS — Z5321 Procedure and treatment not carried out due to patient leaving prior to being seen by health care provider: Secondary | ICD-10-CM

## 2017-01-30 DIAGNOSIS — R42 Dizziness and giddiness: Secondary | ICD-10-CM | POA: Insufficient documentation

## 2017-01-30 DIAGNOSIS — I5021 Acute systolic (congestive) heart failure: Secondary | ICD-10-CM | POA: Diagnosis not present

## 2017-01-30 DIAGNOSIS — I130 Hypertensive heart and chronic kidney disease with heart failure and stage 1 through stage 4 chronic kidney disease, or unspecified chronic kidney disease: Secondary | ICD-10-CM | POA: Diagnosis not present

## 2017-01-30 LAB — BASIC METABOLIC PANEL
ANION GAP: 12 (ref 5–15)
BUN: 19 mg/dL (ref 6–20)
CALCIUM: 8.4 mg/dL — AB (ref 8.9–10.3)
CHLORIDE: 95 mmol/L — AB (ref 101–111)
CO2: 30 mmol/L (ref 22–32)
CREATININE: 1.53 mg/dL — AB (ref 0.61–1.24)
GFR calc Af Amer: 49 mL/min — ABNORMAL LOW (ref >60)
GFR calc non Af Amer: 42 mL/min — ABNORMAL LOW (ref >60)
Glucose, Bld: 136 mg/dL — ABNORMAL HIGH (ref 65–99)
Potassium: 2.7 mmol/L — CL (ref 3.5–5.1)
SODIUM: 137 mmol/L (ref 135–145)

## 2017-01-30 LAB — URINALYSIS, ROUTINE W REFLEX MICROSCOPIC
BILIRUBIN URINE: NEGATIVE
GLUCOSE, UA: NEGATIVE mg/dL
HGB URINE DIPSTICK: NEGATIVE
KETONES UR: NEGATIVE mg/dL
Leukocytes, UA: NEGATIVE
NITRITE: NEGATIVE
PH: 7 (ref 5.0–8.0)
Protein, ur: NEGATIVE mg/dL
Specific Gravity, Urine: 1.006 (ref 1.005–1.030)

## 2017-01-30 LAB — CBC
HCT: 37.2 % — ABNORMAL LOW (ref 39.0–52.0)
HEMOGLOBIN: 12.2 g/dL — AB (ref 13.0–17.0)
MCH: 31.8 pg (ref 26.0–34.0)
MCHC: 32.8 g/dL (ref 30.0–36.0)
MCV: 96.9 fL (ref 78.0–100.0)
Platelets: 115 10*3/uL — ABNORMAL LOW (ref 150–400)
RBC: 3.84 MIL/uL — AB (ref 4.22–5.81)
RDW: 15 % (ref 11.5–15.5)
WBC: 8.1 10*3/uL (ref 4.0–10.5)

## 2017-01-30 LAB — CBG MONITORING, ED: Glucose-Capillary: 132 mg/dL — ABNORMAL HIGH (ref 65–99)

## 2017-01-30 NOTE — ED Notes (Signed)
Unable to locate patient after calling multiple times.

## 2017-01-30 NOTE — ED Triage Notes (Signed)
Per Pt, Pt is coming from home with complaints of gait problems and having intermittent lightheadedness for years. PT reports that he was seen around Christmas and they couldn't explain why he has been dealing with these symptoms. States he has not left his house since Christmas. Denies CP, but has some SOB.

## 2017-01-30 NOTE — ED Notes (Addendum)
Pt has been called x2 times. Pt no present in the waiting area.

## 2017-01-30 NOTE — ED Notes (Signed)
Unable to locate x 1  

## 2017-01-31 ENCOUNTER — Telehealth: Payer: Self-pay | Admitting: Emergency Medicine

## 2017-01-31 ENCOUNTER — Inpatient Hospital Stay (HOSPITAL_COMMUNITY)
Admission: EM | Admit: 2017-01-31 | Discharge: 2017-02-03 | DRG: 291 | Disposition: A | Discharge status: Home Health | Payer: Medicare Other | Attending: Internal Medicine | Admitting: Internal Medicine

## 2017-01-31 ENCOUNTER — Emergency Department (HOSPITAL_COMMUNITY): Payer: Medicare Other

## 2017-01-31 ENCOUNTER — Encounter (HOSPITAL_COMMUNITY): Payer: Self-pay | Admitting: Emergency Medicine

## 2017-01-31 DIAGNOSIS — Z95810 Presence of automatic (implantable) cardiac defibrillator: Secondary | ICD-10-CM

## 2017-01-31 DIAGNOSIS — I5021 Acute systolic (congestive) heart failure: Secondary | ICD-10-CM | POA: Diagnosis not present

## 2017-01-31 DIAGNOSIS — J449 Chronic obstructive pulmonary disease, unspecified: Secondary | ICD-10-CM | POA: Diagnosis present

## 2017-01-31 DIAGNOSIS — Z86718 Personal history of other venous thrombosis and embolism: Secondary | ICD-10-CM

## 2017-01-31 DIAGNOSIS — E876 Hypokalemia: Secondary | ICD-10-CM | POA: Diagnosis present

## 2017-01-31 DIAGNOSIS — I130 Hypertensive heart and chronic kidney disease with heart failure and stage 1 through stage 4 chronic kidney disease, or unspecified chronic kidney disease: Principal | ICD-10-CM | POA: Diagnosis present

## 2017-01-31 DIAGNOSIS — Z800 Family history of malignant neoplasm of digestive organs: Secondary | ICD-10-CM

## 2017-01-31 DIAGNOSIS — Z955 Presence of coronary angioplasty implant and graft: Secondary | ICD-10-CM

## 2017-01-31 DIAGNOSIS — I5023 Acute on chronic systolic (congestive) heart failure: Secondary | ICD-10-CM | POA: Diagnosis present

## 2017-01-31 DIAGNOSIS — I482 Chronic atrial fibrillation, unspecified: Secondary | ICD-10-CM | POA: Diagnosis present

## 2017-01-31 DIAGNOSIS — Z801 Family history of malignant neoplasm of trachea, bronchus and lung: Secondary | ICD-10-CM

## 2017-01-31 DIAGNOSIS — I420 Dilated cardiomyopathy: Secondary | ICD-10-CM | POA: Diagnosis present

## 2017-01-31 DIAGNOSIS — I2510 Atherosclerotic heart disease of native coronary artery without angina pectoris: Secondary | ICD-10-CM | POA: Diagnosis present

## 2017-01-31 DIAGNOSIS — Z7901 Long term (current) use of anticoagulants: Secondary | ICD-10-CM

## 2017-01-31 DIAGNOSIS — Z888 Allergy status to other drugs, medicaments and biological substances status: Secondary | ICD-10-CM

## 2017-01-31 DIAGNOSIS — J9621 Acute and chronic respiratory failure with hypoxia: Secondary | ICD-10-CM | POA: Diagnosis not present

## 2017-01-31 DIAGNOSIS — Z87442 Personal history of urinary calculi: Secondary | ICD-10-CM

## 2017-01-31 DIAGNOSIS — J9611 Chronic respiratory failure with hypoxia: Secondary | ICD-10-CM | POA: Diagnosis present

## 2017-01-31 DIAGNOSIS — Z9981 Dependence on supplemental oxygen: Secondary | ICD-10-CM

## 2017-01-31 DIAGNOSIS — Z7951 Long term (current) use of inhaled steroids: Secondary | ICD-10-CM

## 2017-01-31 DIAGNOSIS — M109 Gout, unspecified: Secondary | ICD-10-CM | POA: Diagnosis present

## 2017-01-31 DIAGNOSIS — I480 Paroxysmal atrial fibrillation: Secondary | ICD-10-CM | POA: Diagnosis present

## 2017-01-31 DIAGNOSIS — Z8249 Family history of ischemic heart disease and other diseases of the circulatory system: Secondary | ICD-10-CM

## 2017-01-31 DIAGNOSIS — N183 Chronic kidney disease, stage 3 unspecified: Secondary | ICD-10-CM | POA: Diagnosis present

## 2017-01-31 DIAGNOSIS — T502X5A Adverse effect of carbonic-anhydrase inhibitors, benzothiadiazides and other diuretics, initial encounter: Secondary | ICD-10-CM | POA: Diagnosis not present

## 2017-01-31 DIAGNOSIS — E1122 Type 2 diabetes mellitus with diabetic chronic kidney disease: Secondary | ICD-10-CM | POA: Diagnosis present

## 2017-01-31 DIAGNOSIS — I255 Ischemic cardiomyopathy: Secondary | ICD-10-CM | POA: Diagnosis present

## 2017-01-31 DIAGNOSIS — Z87891 Personal history of nicotine dependence: Secondary | ICD-10-CM

## 2017-01-31 LAB — CBC WITH DIFFERENTIAL/PLATELET
BASOS ABS: 0 10*3/uL (ref 0.0–0.1)
BASOS PCT: 0 %
EOS ABS: 0.1 10*3/uL (ref 0.0–0.7)
EOS PCT: 2 %
HCT: 36.9 % — ABNORMAL LOW (ref 39.0–52.0)
HEMOGLOBIN: 12.5 g/dL — AB (ref 13.0–17.0)
Lymphocytes Relative: 17 %
Lymphs Abs: 1.2 10*3/uL (ref 0.7–4.0)
MCH: 33 pg (ref 26.0–34.0)
MCHC: 33.9 g/dL (ref 30.0–36.0)
MCV: 97.4 fL (ref 78.0–100.0)
Monocytes Absolute: 0.8 10*3/uL (ref 0.1–1.0)
Monocytes Relative: 12 %
NEUTROS PCT: 69 %
Neutro Abs: 4.9 10*3/uL (ref 1.7–7.7)
PLATELETS: 120 10*3/uL — AB (ref 150–400)
RBC: 3.79 MIL/uL — ABNORMAL LOW (ref 4.22–5.81)
RDW: 14.9 % (ref 11.5–15.5)
WBC: 7.1 10*3/uL (ref 4.0–10.5)

## 2017-01-31 LAB — PROTIME-INR
INR: 2
PROTHROMBIN TIME: 22.5 s — AB (ref 11.4–15.2)

## 2017-01-31 LAB — I-STAT TROPONIN, ED: TROPONIN I, POC: 0.05 ng/mL (ref 0.00–0.08)

## 2017-01-31 LAB — BASIC METABOLIC PANEL
ANION GAP: 8 (ref 5–15)
BUN: 19 mg/dL (ref 6–20)
CO2: 31 mmol/L (ref 22–32)
Calcium: 8.5 mg/dL — ABNORMAL LOW (ref 8.9–10.3)
Chloride: 98 mmol/L — ABNORMAL LOW (ref 101–111)
Creatinine, Ser: 1.5 mg/dL — ABNORMAL HIGH (ref 0.61–1.24)
GFR, EST AFRICAN AMERICAN: 50 mL/min — AB (ref >60)
GFR, EST NON AFRICAN AMERICAN: 43 mL/min — AB (ref >60)
Glucose, Bld: 166 mg/dL — ABNORMAL HIGH (ref 65–99)
POTASSIUM: 2.9 mmol/L — AB (ref 3.5–5.1)
SODIUM: 137 mmol/L (ref 135–145)

## 2017-01-31 LAB — BRAIN NATRIURETIC PEPTIDE: B NATRIURETIC PEPTIDE 5: 449.7 pg/mL — AB (ref 0.0–100.0)

## 2017-01-31 LAB — DIGOXIN LEVEL: DIGOXIN LVL: 0.4 ng/mL — AB (ref 0.8–2.0)

## 2017-01-31 MED ORDER — WARFARIN - PHARMACIST DOSING INPATIENT
Freq: Every day | Status: DC
  Administered 2017-02-01: 18:00:00

## 2017-01-31 MED ORDER — ONDANSETRON HCL 4 MG/2ML IJ SOLN: 4.0000 mg | Freq: Four times a day (QID) | INTRAMUSCULAR | Status: DC | PRN

## 2017-01-31 MED ORDER — DIGOXIN 0.0625 MG HALF TABLET
0.0625 mg | ORAL_TABLET | Freq: Every day | ORAL | Status: DC
  Administered 2017-02-01 – 2017-02-03 (×3): 0.0625 mg via ORAL
  Filled 2017-01-31 (×3): qty 1

## 2017-01-31 MED ORDER — MAGNESIUM OXIDE 400 (241.3 MG) MG PO TABS
800.0000 mg | ORAL_TABLET | Freq: Once | ORAL | Status: AC
Start: 2017-01-31 — End: 2017-01-31
  Administered 2017-01-31: 800 mg via ORAL
  Filled 2017-01-31: qty 2

## 2017-01-31 MED ORDER — SODIUM CHLORIDE 0.9 % IV SOLN: 250.0000 mL | INTRAVENOUS | Status: DC | PRN

## 2017-01-31 MED ORDER — FLUTICASONE FUROATE-VILANTEROL 100-25 MCG/INH IN AEPB
1.0000 | INHALATION_SPRAY | Freq: Every day | RESPIRATORY_TRACT | Status: DC
  Filled 2017-01-31: qty 28

## 2017-01-31 MED ORDER — FLUTICASONE-UMECLIDIN-VILANT 100-62.5-25 MCG/INH IN AEPB: 1.0000 | INHALATION_SPRAY | Freq: Every day | RESPIRATORY_TRACT | Status: DC

## 2017-01-31 MED ORDER — ATORVASTATIN CALCIUM 10 MG PO TABS
10.0000 mg | ORAL_TABLET | Freq: Every day | ORAL | Status: DC
  Administered 2017-01-31 – 2017-02-02 (×3): 10 mg via ORAL
  Filled 2017-01-31 (×3): qty 1

## 2017-01-31 MED ORDER — FLUTICASONE PROPIONATE 50 MCG/ACT NA SUSP
2.0000 | Freq: Every evening | NASAL | Status: DC | PRN
  Administered 2017-02-02: 2 via NASAL
  Filled 2017-01-31: qty 16

## 2017-01-31 MED ORDER — FUROSEMIDE 10 MG/ML IJ SOLN
40.0000 mg | Freq: Two times a day (BID) | INTRAMUSCULAR | Status: DC
  Administered 2017-02-01: 40 mg via INTRAVENOUS
  Filled 2017-01-31: qty 4

## 2017-01-31 MED ORDER — SODIUM CHLORIDE 0.9% FLUSH: 3.0000 mL | INTRAVENOUS | Status: DC | PRN

## 2017-01-31 MED ORDER — FUROSEMIDE 10 MG/ML IJ SOLN
40.0000 mg | Freq: Once | INTRAMUSCULAR | Status: AC
Start: 2017-01-31 — End: 2017-01-31
  Administered 2017-01-31: 40 mg via INTRAVENOUS
  Filled 2017-01-31: qty 4

## 2017-01-31 MED ORDER — WARFARIN SODIUM 2 MG PO TABS
2.0000 mg | ORAL_TABLET | Freq: Once | ORAL | Status: AC
  Administered 2017-01-31: 2 mg via ORAL
  Filled 2017-01-31 (×2): qty 1

## 2017-01-31 MED ORDER — ALFUZOSIN HCL ER 10 MG PO TB24
10.0000 mg | ORAL_TABLET | Freq: Every day | ORAL | Status: DC
  Administered 2017-02-01 – 2017-02-03 (×3): 10 mg via ORAL
  Filled 2017-01-31 (×3): qty 1

## 2017-01-31 MED ORDER — ALLOPURINOL 100 MG PO TABS
100.0000 mg | ORAL_TABLET | Freq: Every day | ORAL | Status: DC
  Administered 2017-02-01 – 2017-02-03 (×3): 100 mg via ORAL
  Filled 2017-01-31 (×3): qty 1

## 2017-01-31 MED ORDER — LORATADINE 10 MG PO TABS
10.0000 mg | ORAL_TABLET | Freq: Every day | ORAL | Status: DC
  Administered 2017-02-01 – 2017-02-03 (×3): 10 mg via ORAL
  Filled 2017-01-31 (×3): qty 1

## 2017-01-31 MED ORDER — POTASSIUM CHLORIDE CRYS ER 20 MEQ PO TBCR
40.0000 meq | EXTENDED_RELEASE_TABLET | Freq: Once | ORAL | Status: AC
Start: 2017-01-31 — End: 2017-01-31
  Administered 2017-01-31: 40 meq via ORAL
  Filled 2017-01-31: qty 2

## 2017-01-31 MED ORDER — UMECLIDINIUM BROMIDE 62.5 MCG/INH IN AEPB
1.0000 | INHALATION_SPRAY | Freq: Every day | RESPIRATORY_TRACT | Status: DC
  Filled 2017-01-31: qty 7

## 2017-01-31 MED ORDER — ACETAMINOPHEN 325 MG PO TABS
650.0000 mg | ORAL_TABLET | ORAL | Status: DC | PRN
Start: 2017-01-31 — End: 2017-02-03

## 2017-01-31 MED ORDER — POTASSIUM CHLORIDE CRYS ER 20 MEQ PO TBCR
60.0000 meq | EXTENDED_RELEASE_TABLET | Freq: Once | ORAL | Status: AC
Start: 2017-01-31 — End: 2017-01-31
  Administered 2017-01-31: 60 meq via ORAL
  Filled 2017-01-31: qty 3

## 2017-01-31 MED ORDER — ALBUTEROL SULFATE (2.5 MG/3ML) 0.083% IN NEBU
1.2500 mg | INHALATION_SOLUTION | Freq: Four times a day (QID) | RESPIRATORY_TRACT | Status: DC
  Administered 2017-02-01 – 2017-02-02 (×5): 1.25 mg via RESPIRATORY_TRACT
  Filled 2017-01-31 (×6): qty 3

## 2017-01-31 MED ORDER — ALBUTEROL SULFATE (2.5 MG/3ML) 0.083% IN NEBU
INHALATION_SOLUTION | RESPIRATORY_TRACT | Status: AC
  Administered 2017-01-31: 2.5 mg
  Filled 2017-01-31: qty 3

## 2017-01-31 MED ORDER — SODIUM CHLORIDE 0.9% FLUSH
3.0000 mL | Freq: Two times a day (BID) | INTRAVENOUS | Status: DC
  Administered 2017-01-31 – 2017-02-03 (×6): 3 mL via INTRAVENOUS

## 2017-01-31 MED ORDER — CARVEDILOL 12.5 MG PO TABS
12.5000 mg | ORAL_TABLET | Freq: Two times a day (BID) | ORAL | Status: DC
  Administered 2017-02-01 – 2017-02-03 (×6): 12.5 mg via ORAL
  Filled 2017-01-31 (×6): qty 1

## 2017-01-31 MED ORDER — ALBUTEROL SULFATE HFA 108 (90 BASE) MCG/ACT IN AERS: 1.0000 | INHALATION_SPRAY | Freq: Four times a day (QID) | RESPIRATORY_TRACT | Status: DC | PRN

## 2017-01-31 NOTE — Telephone Encounter (Signed)
Update: patient getting admitted to [facility], desat significantly with ambulation.  But looks actually more cardiac (CHF exacerbation) with pulm edema on CXR.

## 2017-01-31 NOTE — Telephone Encounter (Signed)
I called Santiago Glad and she states the pt has low oxygen and Dr. Tia Masker would like to speak to someone on call if RB is not available. Please call [phone]

## 2017-01-31 NOTE — ED Notes (Signed)
Pt ambulated on 2L of high flow oxygen, SpO2 dropped into 70s multiple times, Dr. Kathrynn Humble was present.

## 2017-01-31 NOTE — Telephone Encounter (Signed)
Needs ov next week with all meds to regroup as now started on 24h 02 by EDP

## 2017-01-31 NOTE — ED Notes (Signed)
ED TO INPATIENT HANDOFF REPORT  Name/Age/Gender Troy [name] 77 y.o. male  Code Status    Code Status Orders  (From admission, onward)        Start     Ordered   01/31/17 2001  Full code  Continuous     01/31/17 2004    Code Status History    Date Active Date Inactive Code Status Order ID Comments User Context   06/09/2016 22:16 06/11/2016 20:45 Full Code [id number]  Rise Patience, MD Inpatient   03/08/2016 21:34 03/10/2016 17:10 Full Code [id number]  Truett Mainland, DO ED   09/11/2015 18:13 09/13/2015 17:35 Full Code [id number]  Charlynne Cousins, MD Inpatient   08/24/2015 16:39 08/27/2015 16:09 Full Code [id number]  Jonetta Osgood, MD Inpatient   03/05/2015 20:51 03/15/2015 21:34 DNR [id number]  Eugenie Filler, MD Inpatient   12/25/2014 11:35 12/29/2014 17:52 Full Code [id number]  Rama, Venetia Maxon, MD Inpatient   11/21/2013 14:56 11/26/2013 20:32 Full Code [id number]  Hosie Poisson, MD Inpatient      Home/SNF/Other Home  Chief Complaint oxygen level low   Level of Care/Admitting Diagnosis ED Disposition    ED Disposition Condition [place] [facility] Area: [facility] [[id number]]  Level of Care: Telemetry [5]  Admit to tele based on following criteria: Acute CHF  Diagnosis: Acute on chronic systolic CHF (congestive heart failure) [facility]) [240973]  Admitting Physician: Etta Quill [phone]  Attending Physician: Etta Quill [[id number]  PT Class (Do Not Modify): Observation [104]  PT Acc Code (Do Not Modify): Observation [10022]       Medical History Past Medical History:  Diagnosis Date  . Atrial fibrillation ([place])   . CAD (coronary artery disease) 1996   status post PCI of the RCA   . Community acquired pneumonia 08/24/2015  . Congestive heart failure, unspecified   . COPD (chronic obstructive pulmonary disease) ([facility])    Pt on home O2 at night and PRN, unsure of date of diagnosis.  . Diabetes mellitus, type 2 ([place])   . DJD  (degenerative joint disease)   . GERD (gastroesophageal reflux disease)   . Gout   . HTN (hypertension)   . Ischemic dilated cardiomyopathy ([place])   . Nephrolithiasis   . Other primary cardiomyopathies   . Personal history of DVT (deep vein thrombosis)   . Prostatitis   . Shingles     Allergies Allergies  Allergen Reactions  . Benadryl [Diphenhydramine Hcl] Nausea And Vomiting    IV Location/Drains/Wounds Patient Lines/Drains/Airways Status   Active Line/Drains/Airways    Name:   Placement date:   Placement time:   Site:   Days:   Peripheral IV 01/31/17 Right;Lateral Forearm   01/31/17    2015    Forearm   less than 1          Labs/Imaging Results for orders placed or performed during the hospital encounter of 01/31/17 (from the past 48 hour(s))  Brain natriuretic peptide     Status: Abnormal   Collection Time: 01/31/17  3:55 PM  Result Value Ref Range   B Natriuretic Peptide 449.7 (H) 0.0 - 100.0 pg/mL  CBC with Differential/Platelet     Status: Abnormal   Collection Time: 01/31/17  3:57 PM  Result Value Ref Range   WBC 7.1 4.0 - 10.5 K/uL   RBC 3.79 (L) 4.22 - 5.81 MIL/uL   Hemoglobin 12.5 (L) 13.0 - 17.0 g/dL   HCT 36.9 (L) 39.0 -  52.0 %   MCV 97.4 78.0 - 100.0 fL   MCH 33.0 26.0 - 34.0 pg   MCHC 33.9 30.0 - 36.0 g/dL   RDW 14.9 11.5 - 15.5 %   Platelets 120 (L) 150 - 400 K/uL   Neutrophils Relative % 69 %   Neutro Abs 4.9 1.7 - 7.7 K/uL   Lymphocytes Relative 17 %   Lymphs Abs 1.2 0.7 - 4.0 K/uL   Monocytes Relative 12 %   Monocytes Absolute 0.8 0.1 - 1.0 K/uL   Eosinophils Relative 2 %   Eosinophils Absolute 0.1 0.0 - 0.7 K/uL   Basophils Relative 0 %   Basophils Absolute 0.0 0.0 - 0.1 K/uL  Basic metabolic panel     Status: Abnormal   Collection Time: 01/31/17  3:57 PM  Result Value Ref Range   Sodium 137 135 - 145 mmol/L   Potassium 2.9 (L) 3.5 - 5.1 mmol/L   Chloride 98 (L) 101 - 111 mmol/L   CO2 31 22 - 32 mmol/L   Glucose, Bld 166 (H) 65 - 99  mg/dL   BUN 19 6 - 20 mg/dL   Creatinine, Ser 1.50 (H) 0.61 - 1.24 mg/dL   Calcium 8.5 (L) 8.9 - 10.3 mg/dL   GFR calc non Af Amer 43 (L) >60 mL/min   GFR calc Af Amer 50 (L) >60 mL/min    Comment: (NOTE) The eGFR has been calculated using the CKD EPI equation. This calculation has not been validated in all clinical situations. eGFR's persistently <60 mL/min signify possible Chronic Kidney Disease.    Anion gap 8 5 - 15  I-stat troponin, ED     Status: None   Collection Time: 01/31/17  4:14 PM  Result Value Ref Range   Troponin i, poc 0.05 0.00 - 0.08 ng/mL   Comment 3            Comment: Due to the release kinetics of cTnI, a negative result within the first hours of the onset of symptoms does not rule out myocardial infarction with certainty. If myocardial infarction is still suspected, repeat the test at appropriate intervals.    Dg Chest Port 1 View  Result Date: 01/31/2017 CLINICAL DATA:  78 y/o  M; cough. EXAM: PORTABLE CHEST 1 VIEW COMPARISON:  08/14/2016 chest radiograph FINDINGS: Stable cardiac silhouette given projection and technique. 3 lead AICD. Aortic atherosclerosis with calcification. Reticular opacities in the lung bases and peripheral linear opacities, likely mild interstitial edema. No consolidation. No pleural effusion or pneumothorax. No acute osseous abnormality is evident. IMPRESSION: Mild interstitial edema.  No consolidation.  Aortic atherosclerosis. Electronically Signed   By: Kristine Garbe M.D.   On: 01/31/2017 19:05    Pending Labs Unresulted Labs (From admission, onward)   Start     Ordered   02/01/17 6195  Basic metabolic panel  Daily,   R     01/31/17 2004   01/31/17 2013  Protime-INR  STAT,   R     01/31/17 2012      Vitals/Pain Today's Vitals   01/31/17 1236 01/31/17 1539 01/31/17 1755 01/31/17 2000  BP: 133/68 (!) 106/54 (!) 157/61 (!) 154/57  Pulse: 69 93 81 (!) 55  Resp: '20 20 14 12  ' Temp: 98.5 F (36.9 C)     TempSrc:  Oral     SpO2: 96% 96% 96% 98%  PainSc: 0-No pain       Isolation Precautions No active isolations  Medications Medications  sodium chloride flush (  NS) 0.9 % injection 3 mL (not administered)  sodium chloride flush (NS) 0.9 % injection 3 mL (not administered)  0.9 %  sodium chloride infusion (not administered)  acetaminophen (TYLENOL) tablet 650 mg (not administered)  ondansetron (ZOFRAN) injection 4 mg (not administered)  furosemide (LASIX) injection 40 mg (not administered)  albuterol (ACCUNEB) nebulizer solution 3 mL (not administered)  albuterol (PROVENTIL HFA;VENTOLIN HFA) 108 (90 Base) MCG/ACT inhaler 1-2 puff (not administered)  alfuzosin (UROXATRAL) 24 hr tablet 10 mg (not administered)  allopurinol (ZYLOPRIM) tablet 100 mg (not administered)  atorvastatin (LIPITOR) tablet 10 mg (not administered)  carvedilol (COREG) tablet 12.5 mg (not administered)  loratadine (CLARITIN) tablet 10 mg (not administered)  digoxin (LANOXIN) tablet 0.0625 mg (not administered)  Fluticasone-Umeclidin-Vilant 100-62.5-25 MCG/INH AEPB 1 puff (not administered)  fluticasone (FLONASE) 50 MCG/ACT nasal spray 2 spray (not administered)  magnesium oxide (MAG-OX) tablet 800 mg (800 mg Oral Given 01/31/17 1825)  potassium chloride SA (K-DUR,KLOR-CON) CR tablet 60 mEq (60 mEq Oral Given 01/31/17 1825)  furosemide (LASIX) injection 40 mg (40 mg Intravenous Given 01/31/17 2020)  potassium chloride SA (K-DUR,KLOR-CON) CR tablet 40 mEq (40 mEq Oral Given 01/31/17 1935)    Mobility walks with person assist

## 2017-01-31 NOTE — Care Management Note (Addendum)
Case Management Note  Patient Details  Name: Troy [name] MRN: [id number] Date of Birth: [birth year]  CM consulted for pt for [facility].  Spoke with pt who states he would like to use [facility] which he has used in the past.  Pt does not want PT or OT services.  He feels that they make him feel worse, not better.  Pt states he knows he may be headed for ALF or SNF LTC soon.  He advised he has medicaid and owns nothing except a car.  CM advised him to discuss this with the [facility] CSW.  Pts friend mentioned it would be helpful for someone to come in and help with cooking and the house work a few days a week.  CM advised that he could apply for PCS through Medicaid but if he moved into an ALF they would do that for him too and to discuss it with the [facility] CSW.  Pt lives in an apartment alone and his friend comes by to check on him and sometimes cook meals.  Contacted Sherry with [facility] who accepted pt for services.  Faxed orders to their office.  Caryl Pina with [facility] advised that pt would be seen on Monday for start of care.  Updated Dr. Kathrynn Humble, primary RN, and pt/friend.  No further CM needs noted at this time.  Expected Discharge Date:    01/31/2017              Expected Discharge Plan:  [place]  Discharge planning Services  CM Consult  Post Acute Care Choice:  Home Health, Durable Medical Equipment Choice offered to:  Patient  DME Arranged:  Oxygen DME Agency:  [place]:  RN, Nurse's Aide, Social Work [employer] Agency:  [place]  Status of Service:  Completed, signed off  Laymond Postle, Benjaman Lobe, RN 01/31/2017, 5:35 PM

## 2017-01-31 NOTE — ED Provider Notes (Signed)
[facility] Provider Note   CSN: [id number] Arrival date & time: 01/31/17  1217     History   Chief Complaint Chief Complaint  Patient presents with  . low Oxygen saturation    HPI Troy [name] is a 77 y.o. male.  HPI  Pt with cc of CAD, Afib, CHF, COPD on night O2, DM. Patient reports that over the past few days he has been having increasing shortness of breath with exertion.  Patient also has been appreciating more dizziness, described as "swimmy headedness"which typically occurs when he is walking.  Patient has had couple of near fall episodes over the past few days.  Patient denies any new cough, chest pain, wheezing.  Patient has history of CHF, and is has been taking his medications as prescribed.  Patient normally does not sleep well at night, but does not recall any new orthopnea-like symptoms.  Patient also has a history of A. fib, and he does not recall having palpitations with ambulation.  This morning patient woke up and he was just feeling dizzy and short of breath, therefore he checked his oxygen saturation and noted that it was at 87%. -Which prompted him to come to the ER.  Past Medical History:  Diagnosis Date  . Atrial fibrillation ([place])   . CAD (coronary artery disease) 1996   status post PCI of the RCA   . Community acquired pneumonia 08/24/2015  . Congestive heart failure, unspecified   . COPD (chronic obstructive pulmonary disease) (HCC)    Pt on home O2 at night and PRN, unsure of date of diagnosis.  . Diabetes mellitus, type 2 ([place])   . DJD (degenerative joint disease)   . GERD (gastroesophageal reflux disease)   . Gout   . HTN (hypertension)   . 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 Nephrolithiasis   . Other primary cardiomyopathies   . Personal history of DVT (deep vein thrombosis)   . Prostatitis   . Shingles     Patient Active Problem List   Diagnosis Date Noted  . Acute on chronic  respiratory failure with hypoxia ([place]) 01/31/2017  . Acute on chronic systolic CHF (congestive heart failure) ([place]) 01/31/2017  . Allergic rhinitis 12/05/2016  . Hoarseness 11/04/2016  . Controlled type 2 diabetes mellitus without complication, without long-term current use of insulin ([place]) 09/19/2016  . High risk medications (not anticoagulants) long-term use 09/19/2016  . History of ASCVD 09/19/2016  . Mixed hyperlipidemia 09/19/2016  . Sudden visual loss of left eye 09/19/2016  . Lower GI bleed 09/11/2015  . Bright red blood per rectum 09/11/2015  . BPH (benign prostatic hyperplasia) 12/25/2014  . FTT (failure to thrive) in adult 12/25/2014  . Encounter for therapeutic drug monitoring 08/05/2014  . Pulmonary nodules 12/23/2013  . Hemoptysis   . Dyspnea   . Chronic kidney disease, stage 3 ([place])   . Coronary artery disease involving native coronary artery of native heart without angina pectoris   . Paroxysmal atrial fibrillation (HCC)   . Frequent PVCs   . Hypoxia   . COPD (chronic obstructive pulmonary disease) ([place]) 06/16/2013  . Lipoma of neck 09/11/2011  . 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 Chronic atrial fibrillation ([place]) 01/30/2011  . Chronic systolic CHF (congestive heart failure) ([place])   . Other primary cardiomyopathies   . Diverticulosis of colon with hemorrhage 08/28/2010    Past Surgical History:  Procedure Laterality Date  . BACK SURGERY    .  CARDIAC DEFIBRILLATOR PLACEMENT    . CORONARY STENT PLACEMENT    . PACEMAKER INSERTION         Home Medications    Prior to Admission medications   Medication Sig Start Date End Date Taking? Authorizing Provider  acetaminophen (TYLENOL) 500 MG tablet Take 500 mg by mouth every 6 (six) hours as needed for mild pain or moderate pain.   Yes [provider]  albuterol (ACCUNEB) 1.25 MG/3ML nebulizer solution Take 3 mLs by nebulization 4 (four) times daily. 08/21/15  Yes  [provider]  albuterol (PROAIR HFA) 108 (90 Base) MCG/ACT inhaler Inhale 1-2 puffs into the lungs every 6 (six) hours as needed for wheezing or shortness of breath. 09/25/16  Yes Caren Macadam, MD  alfuzosin (UROXATRAL) 10 MG 24 hr tablet Take 10 mg by mouth daily with breakfast.    Yes [provider]  allopurinol (ZYLOPRIM) 100 MG tablet Take 1 tablet (100 mg total) daily by mouth. 11/29/16  Yes Hagler, Apolonio Schneiders, MD  atorvastatin (LIPITOR) 10 MG tablet Take 1 tablet (10 mg total) by mouth at bedtime. 09/19/16  Yes Caren Macadam, MD  carvedilol (COREG) 12.5 MG tablet Take 1 tablet (12.5 mg total) by mouth 2 (two) times daily with a meal. 03/03/15  Yes Jettie Booze, MD  cetirizine (ZYRTEC) 10 MG tablet Take 10 mg by mouth daily.   Yes [provider]  digoxin (LANOXIN) 0.125 MG tablet Take 0.5 tablets (0.0625 mg total) daily by mouth. 11/29/16  Yes Hagler, Apolonio Schneiders, MD  fluticasone (FLONASE) 50 MCG/ACT nasal spray Place 2 sprays into both nostrils at bedtime as needed for allergies. 10/22/16  Yes Hagler, Apolonio Schneiders, MD  Fluticasone-Umeclidin-Vilant (TRELEGY ELLIPTA) 100-62.5-25 MCG/INH AEPB Inhale 1 puff daily into the lungs. 12/05/16  Yes Collene Gobble, MD  furosemide (LASIX) 40 MG tablet Take 1.5 tablets (60 mg total) by mouth 2 (two) times daily. 09/05/16  Yes Jettie Booze, MD  warfarin (COUMADIN) 1 MG tablet Take as directed by [facility] Patient taking differently: Take 2-3 mg by mouth. Take as directed by [facility] - Take 3 mg on Mondays then take 2 mg every other day of the week 10/30/16  Yes Jettie Booze, MD    Family History Family History  Problem Relation Age of Onset  . Heart disease Father   . Cancer Father        LUNG  . Colon cancer Mother   . Stroke Paternal Uncle   . Heart attack Neg Hx   . Hyperlipidemia Neg Hx   . Hypertension Neg Hx     Social History Social History   Tobacco Use  . Smoking status: Former Smoker     Packs/day: 4.00    Years: 50.00    Pack years: 200.00    Types: Cigarettes    Last attempt to quit: 01/22/1980    Years since quitting: 37.0  . Smokeless tobacco: Never Used  Substance Use Topics  . Alcohol use: No    Alcohol/week: 0.0 oz    Comment: denies  . Drug use: No     Allergies   Benadryl [diphenhydramine hcl]   Review of Systems Review of Systems  Constitutional: Positive for activity change.  Respiratory: Positive for shortness of breath.   Cardiovascular: Negative for chest pain.  Allergic/Immunologic: Negative for immunocompromised state.  All other systems reviewed and are negative.  Physical Exam Updated Vital Signs BP (!) 164/92   Pulse (!) 130   Temp 98.5 F (  36.9 C) (Oral)   Resp 20   SpO2 92%   Physical Exam  Constitutional: He is oriented to person, place, and time. He appears well-developed.  HENT:  Head: Atraumatic.  Neck: Neck supple. No JVD present.  Cardiovascular: Normal rate.  Pulmonary/Chest: Effort normal. He has no rales. He exhibits no tenderness.  Abdominal: Soft.  Musculoskeletal: He exhibits no edema.  Neurological: He is alert and oriented to person, place, and time.  Skin: Skin is warm.  Nursing note and vitals reviewed.    ED Treatments / Results  Labs (all labs ordered are listed, but only abnormal results are displayed) Labs Reviewed  CBC WITH DIFFERENTIAL/PLATELET - Abnormal; Notable for the following components:      Result Value   RBC 3.79 (*)    Hemoglobin 12.5 (*)    HCT 36.9 (*)    Platelets 120 (*)    All other components within normal limits  BASIC METABOLIC PANEL - Abnormal; Notable for the following components:   Potassium 2.9 (*)    Chloride 98 (*)    Glucose, Bld 166 (*)    Creatinine, Ser 1.50 (*)    Calcium 8.5 (*)    GFR calc non Af Amer 43 (*)    GFR calc Af Amer 50 (*)    All other components within normal limits  BRAIN NATRIURETIC PEPTIDE - Abnormal; Notable for the following components:     B Natriuretic Peptide 449.7 (*)    All other components within normal limits  PROTIME-INR - Abnormal; Notable for the following components:   Prothrombin Time 22.5 (*)    All other components within normal limits  BASIC METABOLIC PANEL  DIGOXIN LEVEL  I-STAT TROPONIN, ED    EKG  EKG Interpretation  Date/Time:  Friday January 31 2017 16:49:01 EST Ventricular Rate:  78 PR Interval:    QRS Duration: 155 QT Interval:  414 QTC Calculation: 558 R Axis:   131 Text Interpretation:  A-V dual-paced complexes w/ some inhibition No further analysis attempted due to paced rhythm No acute changes Confirmed by Varney Biles ([id number]) on 01/31/2017 7:02:24 PM       Radiology Dg Chest Port 1 View  Result Date: 01/31/2017 CLINICAL DATA:  77 y/o  M; cough. EXAM: PORTABLE CHEST 1 VIEW COMPARISON:  08/14/2016 chest radiograph FINDINGS: Stable cardiac silhouette given projection and technique. 3 lead AICD. Aortic atherosclerosis with calcification. Reticular opacities in the lung bases and peripheral linear opacities, likely mild interstitial edema. No consolidation. No pleural effusion or pneumothorax. No acute osseous abnormality is evident. IMPRESSION: Mild interstitial edema.  No consolidation.  Aortic atherosclerosis. Electronically Signed   By: Kristine Garbe M.D.   On: 01/31/2017 19:05    Procedures Procedures (including critical care time)  Medications Ordered in ED Medications  sodium chloride flush (NS) 0.9 % injection 3 mL (not administered)  sodium chloride flush (NS) 0.9 % injection 3 mL (not administered)  0.9 %  sodium chloride infusion (not administered)  acetaminophen (TYLENOL) tablet 650 mg (not administered)  ondansetron (ZOFRAN) injection 4 mg (not administered)  furosemide (LASIX) injection 40 mg (not administered)  albuterol (ACCUNEB) nebulizer solution 3 mL (not administered)  albuterol (PROVENTIL HFA;VENTOLIN HFA) 108 (90 Base) MCG/ACT inhaler 1-2 puff (not  administered)  alfuzosin (UROXATRAL) 24 hr tablet 10 mg (not administered)  allopurinol (ZYLOPRIM) tablet 100 mg (not administered)  atorvastatin (LIPITOR) tablet 10 mg (not administered)  carvedilol (COREG) tablet 12.5 mg (not administered)  loratadine (CLARITIN) tablet 10 mg (not  administered)  digoxin (LANOXIN) tablet 0.0625 mg (not administered)  Fluticasone-Umeclidin-Vilant 100-62.5-25 MCG/INH AEPB 1 puff (not administered)  fluticasone (FLONASE) 50 MCG/ACT nasal spray 2 spray (not administered)  magnesium oxide (MAG-OX) tablet 800 mg (800 mg Oral Given 01/31/17 1825)  potassium chloride SA (K-DUR,KLOR-CON) CR tablet 60 mEq (60 mEq Oral Given 01/31/17 1825)  furosemide (LASIX) injection 40 mg (40 mg Intravenous Given 01/31/17 2020)  potassium chloride SA (K-DUR,KLOR-CON) CR tablet 40 mEq (40 mEq Oral Given 01/31/17 1935)     Initial Impression / Assessment and Plan / ED Course  I have reviewed the triage vital signs and the nursing notes.  Pertinent labs & imaging results that were available during my care of the patient were reviewed by me and considered in my medical decision making (see chart for details).  Clinical Course as of Jan 31 2105  Ludwig Clarks Jan 31, 2017  2104 Upon ambulation, patient's O2 sats dropped to upper 70s, with ambulation of about 50-100 yards. Chest x-ray shows interstitial edema.  Given the advanced COPD, it appears that pt has poor reserve leading to decompensation even with slight volume overload. We will admit for optimization.  [AN]    Clinical Course User Index [AN] Varney Biles, MD   77 year old with history of COPD, CHF, A. fib comes in with chief complaint of dizziness and shortness of breath. Patient does not appear to be wheezing on exam.  His chest is tight, likely chronic.  No significant rales appreciated either.  No pitting edema.  Patient has no history of PE and the clinical suspicion for PE at this time is low -especially given that patient is  already taking warfarin for his A. fib.    Most likely patient's symptoms are due to worsening of COPD.  CHF considered in the differential diagnosis, however patient is denying orthopnea.  ACS also considered in the differential, however EKG is reassuring.  Finally patient might also be having A. fib with RVR leading to his symptoms.  Will ambulate and reassess.  Final Clinical Impressions(s) / ED Diagnoses   Final diagnoses:  Acute systolic congestive heart failure ([place])  Acute on chronic respiratory failure with hypoxia [facility])    ED Discharge Orders    None       Varney Biles, MD 01/31/17 2107

## 2017-01-31 NOTE — ED Triage Notes (Signed)
Patient reports that he has PRN oxygen at home and this morning when he woke up he checked his O2 level and reading 86-89%. Patient states that for past week he will have episodes where gets "swimmy headed" and feels like going to pass out.

## 2017-01-31 NOTE — ED Notes (Signed)
Spoke with Mickel Baas in the lab, lab to add on PT-INR

## 2017-01-31 NOTE — H&P (Signed)
History and Physical    [name]:[id number] DOB: [birth year] DOA: 01/31/2017  PCP: Caren Macadam, MD  Patient coming from: Home  I have personally briefly reviewed patient's old medical records in [place]  Chief Complaint: Hypoxia  HPI: Troy [name] is a 77 y.o. male with medical history significant of COPD on PRN O2, CAD, ischemic CM, AICD, A.Fib on coumadin.  Patient woke up this AM, O2 sats in upper 80s.  Episodes of lightheadedness and near syncope over the past week.  Symptoms worse with exertion.  Associated DOE worse than baseline.  Presents to ED.   ED Course: Desats down to upper 70s with ambulation on 2L.  CXR shows mild pulm edema.   Review of Systems: Denies: increased peripheral edema, wt gain.  As per HPI otherwise 10 point review of systems negative.   Past Medical History:  Diagnosis Date  . Atrial fibrillation ([place])   . CAD (coronary artery disease) 1996   status post PCI of the RCA   . Community acquired pneumonia 08/24/2015  . Congestive heart failure, unspecified   . COPD (chronic obstructive pulmonary disease) (HCC)    Pt on home O2 at night and PRN, unsure of date of diagnosis.  . Diabetes mellitus, type 2 ([place])   . DJD (degenerative joint disease)   . GERD (gastroesophageal reflux disease)   . Gout   . HTN (hypertension)   . 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 Nephrolithiasis   . Other primary cardiomyopathies   . Personal history of DVT (deep vein thrombosis)   . Prostatitis   . Shingles     Past Surgical History:  Procedure Laterality Date  . BACK SURGERY    . CARDIAC DEFIBRILLATOR PLACEMENT    . CORONARY STENT PLACEMENT    . PACEMAKER INSERTION       reports that he quit smoking about 37 years ago. His smoking use included cigarettes. He has a 200.00 pack-year smoking history. he has never used smokeless tobacco. He reports that he does not drink alcohol or use drugs.  Allergies  Allergen Reactions  .  Benadryl [Diphenhydramine Hcl] Nausea And Vomiting    Family History  Problem Relation Age of Onset  . Heart disease Father   . Cancer Father        LUNG  . Colon cancer Mother   . Stroke Paternal Uncle   . Heart attack Neg Hx   . Hyperlipidemia Neg Hx   . Hypertension Neg Hx      Prior to Admission medications   Medication Sig Start Date End Date Taking? Authorizing Provider  acetaminophen (TYLENOL) 500 MG tablet Take 500 mg by mouth every 6 (six) hours as needed for mild pain or moderate pain.   Yes [provider]  albuterol (ACCUNEB) 1.25 MG/3ML nebulizer solution Take 3 mLs by nebulization 4 (four) times daily. 08/21/15  Yes [provider]  albuterol (PROAIR HFA) 108 (90 Base) MCG/ACT inhaler Inhale 1-2 puffs into the lungs every 6 (six) hours as needed for wheezing or shortness of breath. 09/25/16  Yes Caren Macadam, MD  alfuzosin (UROXATRAL) 10 MG 24 hr tablet Take 10 mg by mouth daily with breakfast.    Yes [provider]  allopurinol (ZYLOPRIM) 100 MG tablet Take 1 tablet (100 mg total) daily by mouth. 11/29/16  Yes Hagler, Apolonio Schneiders, MD  atorvastatin (LIPITOR) 10 MG tablet Take 1 tablet (10 mg total) by mouth at bedtime. 09/19/16  Yes Caren Macadam, MD  carvedilol (COREG) 12.5 MG tablet Take 1 tablet (12.5 mg total) by mouth 2 (two) times daily with a meal. 03/03/15  Yes Jettie Booze, MD  cetirizine (ZYRTEC) 10 MG tablet Take 10 mg by mouth daily.   Yes [provider]  digoxin (LANOXIN) 0.125 MG tablet Take 0.5 tablets (0.0625 mg total) daily by mouth. 11/29/16  Yes Hagler, Apolonio Schneiders, MD  fluticasone (FLONASE) 50 MCG/ACT nasal spray Place 2 sprays into both nostrils at bedtime as needed for allergies. 10/22/16  Yes Hagler, Apolonio Schneiders, MD  Fluticasone-Umeclidin-Vilant (TRELEGY ELLIPTA) 100-62.5-25 MCG/INH AEPB Inhale 1 puff daily into the lungs. 12/05/16  Yes Collene Gobble, MD  furosemide (LASIX) 40 MG tablet Take 1.5 tablets (60 mg total) by  mouth 2 (two) times daily. 09/05/16  Yes Jettie Booze, MD  warfarin (COUMADIN) 1 MG tablet Take as directed by [facility] Patient taking differently: Take 2-3 mg by mouth. Take as directed by [facility] - Take 3 mg on Mondays then take 2 mg every other day of the week 10/30/16  Yes Jettie Booze, MD    Physical Exam: Vitals:   01/31/17 1236 01/31/17 1539 01/31/17 1755 01/31/17 2000  BP: 133/68 (!) 106/54 (!) 157/61 (!) 154/57  Pulse: 69 93 81 (!) 55  Resp: 20 20 14 12   Temp: 98.5 F (36.9 C)     TempSrc: Oral     SpO2: 96% 96% 96% 98%    Constitutional: NAD, calm, comfortable Eyes: PERRL, lids and conjunctivae normal ENMT: Mucous membranes are moist. Posterior pharynx clear of any exudate or lesions.Normal dentition.  Neck: normal, supple, no masses, no thyromegaly Respiratory: few crackles at bases Cardiovascular: Regular rate and rhythm, no murmurs / rubs / gallops. No extremity edema. 2+ pedal pulses. No carotid bruits.  Abdomen: no tenderness, no masses palpated. No hepatosplenomegaly. Bowel sounds positive.  Musculoskeletal: no clubbing / cyanosis. No joint deformity upper and lower extremities. Good ROM, no contractures. Normal muscle tone.  Skin: no rashes, lesions, ulcers. No induration Neurologic: CN 2-12 grossly intact. Sensation intact, DTR normal. Strength 5/5 in all 4.  Psychiatric: Normal judgment and insight. Alert and oriented x 3. Normal mood.    Labs on Admission: I have personally reviewed following labs and imaging studies  CBC: Recent Labs  Lab 01/30/17 1254 01/31/17 1557  WBC 8.1 7.1  NEUTROABS  --  4.9  HGB 12.2* 12.5*  HCT 37.2* 36.9*  MCV 96.9 97.4  PLT 115* 751*   Basic Metabolic Panel: Recent Labs  Lab 01/30/17 1254 01/31/17 1557  NA 137 137  K 2.7* 2.9*  CL 95* 98*  CO2 30 31  GLUCOSE 136* 166*  BUN 19 19  CREATININE 1.53* 1.50*  CALCIUM 8.4* 8.5*   GFR: Estimated Creatinine Clearance: 40.5 mL/min (A) (by  C-G formula based on SCr of 1.5 mg/dL (H)). Liver Function Tests: No results for input(s): AST, ALT, ALKPHOS, BILITOT, PROT, ALBUMIN in the last 168 hours. No results for input(s): LIPASE, AMYLASE in the last 168 hours. No results for input(s): AMMONIA in the last 168 hours. Coagulation Profile: No results for input(s): INR, PROTIME in the last 168 hours. Cardiac Enzymes: No results for input(s): CKTOTAL, CKMB, CKMBINDEX, TROPONINI in the last 168 hours. BNP (last 3 results) No results for input(s): PROBNP in the last 8760 hours. HbA1C: No results for input(s): HGBA1C in the last 72 hours. CBG: Recent Labs  Lab 01/30/17 1244  GLUCAP 132*   Lipid Profile: No results for input(s): CHOL,  HDL, LDLCALC, TRIG, CHOLHDL, LDLDIRECT in the last 72 hours. Thyroid Function Tests: No results for input(s): TSH, T4TOTAL, FREET4, T3FREE, THYROIDAB in the last 72 hours. Anemia Panel: No results for input(s): VITAMINB12, FOLATE, FERRITIN, TIBC, IRON, RETICCTPCT in the last 72 hours. Urine analysis:    Component Value Date/Time   COLORURINE YELLOW 01/30/2017 [place] 01/30/2017 1545   LABSPEC 1.006 01/30/2017 1545   PHURINE 7.0 01/30/2017 1545   GLUCOSEU NEGATIVE 01/30/2017 1545   HGBUR NEGATIVE 01/30/2017 1545   BILIRUBINUR NEGATIVE 01/30/2017 1545   KETONESUR NEGATIVE 01/30/2017 1545   PROTEINUR NEGATIVE 01/30/2017 1545   UROBILINOGEN 0.2 11/21/2013 1946   NITRITE NEGATIVE 01/30/2017 1545   LEUKOCYTESUR NEGATIVE 01/30/2017 1545    Radiological Exams on Admission: Dg Chest Port 1 View  Result Date: 01/31/2017 CLINICAL DATA:  77 y/o  M; cough. EXAM: PORTABLE CHEST 1 VIEW COMPARISON:  08/14/2016 chest radiograph FINDINGS: Stable cardiac silhouette given projection and technique. 3 lead AICD. Aortic atherosclerosis with calcification. Reticular opacities in the lung bases and peripheral linear opacities, likely mild interstitial edema. No consolidation. No pleural effusion or  pneumothorax. No acute osseous abnormality is evident. IMPRESSION: Mild interstitial edema.  No consolidation.  Aortic atherosclerosis. Electronically Signed   By: Kristine Garbe M.D.   On: 01/31/2017 19:05    EKG: Independently reviewed.  Assessment/Plan Principal Problem:   Acute on chronic respiratory failure with hypoxia (HCC) Active Problems:   Biventricular implantable cardioverter-defibrillator in situ   Chronic atrial fibrillation (HCC)   COPD (chronic obstructive pulmonary disease) (HCC)   Chronic kidney disease, stage 3 (HCC)   Acute on chronic systolic CHF (congestive heart failure) ([place])    1. Acute on chronic resp failure with hypoxia - due to acute on chronic systolic CHF on top of a baseline COPD. 2. Acute on chronic systolic CHF - 1. CHF pathway 2. Lasix: 40mg  IV in ED, then 40mg  IV BID 3. Daily BMP 4. Strict intake and output 5. Tele monitor 3. COPD - 1. Continue home nebs 2. Continue O2 via [place] 4. CKD stage 3 - 1. Baseline currently 2. Monitor with diuresis, daily BMP 5. A.Fib - 1. Continue coumadin 2. Continue coreg 3. Continue digoxin  DVT prophylaxis: Coumadin Code Status: Full Family Communication: No family inroom Disposition Plan: Home after admit Consults called: None Admission status: Place in obs - convert to IP if still having increased O2 requirements from baseline and not discharged tomorrow.   Etta Quill DO [facility] Pager [phone]  If 7AM-7PM, please contact day team taking care of patient [email or web address] Password [facility]  01/31/2017, 8:37 PM

## 2017-01-31 NOTE — Progress Notes (Signed)
ANTICOAGULATION CONSULT NOTE - Initial Consult  Pharmacy Consult for warfarin Indication: atrial fibrillation  Allergies  Allergen Reactions  . Benadryl [Diphenhydramine Hcl] Nausea And Vomiting    Patient Measurements:    Vital Signs: Temp: 98.5 F (36.9 C) (01/11 1236) Temp Source: Oral (01/11 1236) BP: 164/92 (01/11 2030) Pulse Rate: 130 (01/11 2030)  Labs: Recent Labs    01/30/17 1254 01/31/17 1557  HGB 12.2* 12.5*  HCT 37.2* 36.9*  PLT 115* 120*  LABPROT  --  22.5*  INR  --  2.00  CREATININE 1.53* 1.50*    Estimated Creatinine Clearance: 40.5 mL/min (A) (by C-G formula based on SCr of 1.5 mg/dL (H)).   Medical History: Past Medical History:  Diagnosis Date  . Atrial fibrillation ([place])   . CAD (coronary artery disease) 1996   status post PCI of the RCA   . Community acquired pneumonia 08/24/2015  . Congestive heart failure, unspecified   . COPD (chronic obstructive pulmonary disease) (HCC)    Pt on home O2 at night and PRN, unsure of date of diagnosis.  . Diabetes mellitus, type 2 ([place])   . DJD (degenerative joint disease)   . GERD (gastroesophageal reflux disease)   . Gout   . HTN (hypertension)   . 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 Nephrolithiasis   . Other primary cardiomyopathies   . Personal history of DVT (deep vein thrombosis)   . Prostatitis   . Shingles     Medications:  Medications Prior to Admission  Medication Sig Dispense Refill Last Dose  . acetaminophen (TYLENOL) 500 MG tablet Take 500 mg by mouth every 6 (six) hours as needed for mild pain or moderate pain.   Past Week at Unknown time  . albuterol (ACCUNEB) 1.25 MG/3ML nebulizer solution Take 3 mLs by nebulization 4 (four) times daily.  2 01/31/2017 at Unknown time  . albuterol (PROAIR HFA) 108 (90 Base) MCG/ACT inhaler Inhale 1-2 puffs into the lungs every 6 (six) hours as needed for wheezing or shortness of breath. 8.5 g 1 01/31/2017 at Unknown time  . alfuzosin (UROXATRAL) 10  MG 24 hr tablet Take 10 mg by mouth daily with breakfast.    01/31/2017 at Unknown time  . allopurinol (ZYLOPRIM) 100 MG tablet Take 1 tablet (100 mg total) daily by mouth. 90 tablet 0 01/30/2017 at Unknown time  . atorvastatin (LIPITOR) 10 MG tablet Take 1 tablet (10 mg total) by mouth at bedtime. 90 tablet 1 01/30/2017 at Unknown time  . carvedilol (COREG) 12.5 MG tablet Take 1 tablet (12.5 mg total) by mouth 2 (two) times daily with a meal. 60 tablet 6 01/31/2017 at 2000  . cetirizine (ZYRTEC) 10 MG tablet Take 10 mg by mouth daily.   01/31/2017 at Unknown time  . digoxin (LANOXIN) 0.125 MG tablet Take 0.5 tablets (0.0625 mg total) daily by mouth. 45 tablet 0 01/31/2017 at Unknown time  . fluticasone (FLONASE) 50 MCG/ACT nasal spray Place 2 sprays into both nostrils at bedtime as needed for allergies. 16 g 3 01/30/2017 at Unknown time  . Fluticasone-Umeclidin-Vilant (TRELEGY ELLIPTA) 100-62.5-25 MCG/INH AEPB Inhale 1 puff daily into the lungs. 1 each 0 01/30/2017 at Unknown time  . furosemide (LASIX) 40 MG tablet Take 1.5 tablets (60 mg total) by mouth 2 (two) times daily. 90 tablet 11 01/31/2017 at Unknown time  . warfarin (COUMADIN) 1 MG tablet Take as directed by [facility] (Patient taking differently: Take 2-3 mg by mouth. Take as directed by [facility] -  Take 3 mg on Mondays then take 2 mg every other day of the week) 70 tablet 3 01/30/2017 at 2000   Scheduled:  . albuterol  3 mL Nebulization QID  . [START ON 02/01/2017] alfuzosin  10 mg Oral Q breakfast  . allopurinol  100 mg Oral Daily  . atorvastatin  10 mg Oral QHS  . [START ON 02/01/2017] carvedilol  12.5 mg Oral BID WC  . [START ON 02/01/2017] digoxin  0.0625 mg Oral Daily  . Fluticasone-Umeclidin-Vilant  1 puff Inhalation Daily  . [START ON 02/01/2017] furosemide  40 mg Intravenous BID  . [START ON 02/01/2017] loratadine  10 mg Oral Daily  . sodium chloride flush  3 mL Intravenous Q12H   Assessment: 64 yoM with PMH COPD, CAD, AICD,  Afib on warfarin, presents with hypoxia. Pharmacy consulted to continue warfarin while patient admitted   Baseline INR therapeutic but borderline low at 2.0  Prior anticoagulation: warfarin 2 mg daily except 3 mg on Mondays; LD 1/10  Significant events:  Today, 01/31/2017:  CBC: Hgb wnl; Plt low but > 100k   INR therapeutic but borderline low  Major drug interactions: none  No bleeding issues per nursing  Diet ordered  Goal of Therapy: INR 2-3  Plan:  Warfarin 2 mg PO tonight at 18:00  Daily INR  CBC at least q72 hr while on warfarin  Monitor for signs of bleeding or thrombosis   Reuel Boom, PharmD Pager: [phone] 01/31/2017, 9:11 PM

## 2017-01-31 NOTE — Telephone Encounter (Signed)
Call on 1/14 as he is currently in the ED

## 2017-02-01 ENCOUNTER — Observation Stay (HOSPITAL_BASED_OUTPATIENT_CLINIC_OR_DEPARTMENT_OTHER): Payer: Medicare Other

## 2017-02-01 ENCOUNTER — Other Ambulatory Visit: Payer: Self-pay

## 2017-02-01 ENCOUNTER — Encounter (HOSPITAL_COMMUNITY): Payer: Self-pay

## 2017-02-01 DIAGNOSIS — I509 Heart failure, unspecified: Secondary | ICD-10-CM | POA: Diagnosis not present

## 2017-02-01 DIAGNOSIS — J449 Chronic obstructive pulmonary disease, unspecified: Secondary | ICD-10-CM

## 2017-02-01 DIAGNOSIS — J9621 Acute and chronic respiratory failure with hypoxia: Secondary | ICD-10-CM

## 2017-02-01 DIAGNOSIS — Z955 Presence of coronary angioplasty implant and graft: Secondary | ICD-10-CM | POA: Diagnosis not present

## 2017-02-01 DIAGNOSIS — Z801 Family history of malignant neoplasm of trachea, bronchus and lung: Secondary | ICD-10-CM | POA: Diagnosis not present

## 2017-02-01 DIAGNOSIS — I5021 Acute systolic (congestive) heart failure: Secondary | ICD-10-CM | POA: Diagnosis present

## 2017-02-01 DIAGNOSIS — I482 Chronic atrial fibrillation: Secondary | ICD-10-CM

## 2017-02-01 DIAGNOSIS — E876 Hypokalemia: Secondary | ICD-10-CM | POA: Diagnosis present

## 2017-02-01 DIAGNOSIS — I340 Nonrheumatic mitral (valve) insufficiency: Secondary | ICD-10-CM | POA: Diagnosis not present

## 2017-02-01 DIAGNOSIS — Z87891 Personal history of nicotine dependence: Secondary | ICD-10-CM | POA: Diagnosis not present

## 2017-02-01 DIAGNOSIS — I351 Nonrheumatic aortic (valve) insufficiency: Secondary | ICD-10-CM

## 2017-02-01 DIAGNOSIS — Z7901 Long term (current) use of anticoagulants: Secondary | ICD-10-CM | POA: Diagnosis not present

## 2017-02-01 DIAGNOSIS — N183 Chronic kidney disease, stage 3 (moderate): Secondary | ICD-10-CM

## 2017-02-01 DIAGNOSIS — M109 Gout, unspecified: Secondary | ICD-10-CM | POA: Diagnosis present

## 2017-02-01 DIAGNOSIS — I2510 Atherosclerotic heart disease of native coronary artery without angina pectoris: Secondary | ICD-10-CM | POA: Diagnosis present

## 2017-02-01 DIAGNOSIS — Z800 Family history of malignant neoplasm of digestive organs: Secondary | ICD-10-CM | POA: Diagnosis not present

## 2017-02-01 DIAGNOSIS — I130 Hypertensive heart and chronic kidney disease with heart failure and stage 1 through stage 4 chronic kidney disease, or unspecified chronic kidney disease: Secondary | ICD-10-CM | POA: Diagnosis present

## 2017-02-01 DIAGNOSIS — Z87442 Personal history of urinary calculi: Secondary | ICD-10-CM | POA: Diagnosis not present

## 2017-02-01 DIAGNOSIS — Z86718 Personal history of other venous thrombosis and embolism: Secondary | ICD-10-CM | POA: Diagnosis not present

## 2017-02-01 DIAGNOSIS — I255 Ischemic cardiomyopathy: Secondary | ICD-10-CM | POA: Diagnosis present

## 2017-02-01 DIAGNOSIS — I420 Dilated cardiomyopathy: Secondary | ICD-10-CM | POA: Diagnosis present

## 2017-02-01 DIAGNOSIS — Z9981 Dependence on supplemental oxygen: Secondary | ICD-10-CM | POA: Diagnosis not present

## 2017-02-01 DIAGNOSIS — E1122 Type 2 diabetes mellitus with diabetic chronic kidney disease: Secondary | ICD-10-CM | POA: Diagnosis present

## 2017-02-01 DIAGNOSIS — T502X5A Adverse effect of carbonic-anhydrase inhibitors, benzothiadiazides and other diuretics, initial encounter: Secondary | ICD-10-CM | POA: Diagnosis not present

## 2017-02-01 DIAGNOSIS — I5023 Acute on chronic systolic (congestive) heart failure: Secondary | ICD-10-CM | POA: Diagnosis not present

## 2017-02-01 DIAGNOSIS — Z95810 Presence of automatic (implantable) cardiac defibrillator: Secondary | ICD-10-CM | POA: Diagnosis not present

## 2017-02-01 DIAGNOSIS — I480 Paroxysmal atrial fibrillation: Secondary | ICD-10-CM | POA: Diagnosis present

## 2017-02-01 DIAGNOSIS — Z8249 Family history of ischemic heart disease and other diseases of the circulatory system: Secondary | ICD-10-CM | POA: Diagnosis not present

## 2017-02-01 LAB — BASIC METABOLIC PANEL
ANION GAP: 6 (ref 5–15)
BUN: 19 mg/dL (ref 6–20)
CALCIUM: 8.4 mg/dL — AB (ref 8.9–10.3)
CO2: 31 mmol/L (ref 22–32)
Chloride: 100 mmol/L — ABNORMAL LOW (ref 101–111)
Creatinine, Ser: 1.41 mg/dL — ABNORMAL HIGH (ref 0.61–1.24)
GFR, EST AFRICAN AMERICAN: 54 mL/min — AB (ref >60)
GFR, EST NON AFRICAN AMERICAN: 47 mL/min — AB (ref >60)
Glucose, Bld: 155 mg/dL — ABNORMAL HIGH (ref 65–99)
Potassium: 4.1 mmol/L (ref 3.5–5.1)
SODIUM: 137 mmol/L (ref 135–145)

## 2017-02-01 LAB — ECHOCARDIOGRAM COMPLETE
Ao-asc: 26 cm
CHL CUP REG VEL DIAS: 97.7 cm/s
CHL CUP TV REG PEAK VELOCITY: 316 cm/s
E decel time: 454 msec
FS: 19 % — AB (ref 28–44)
HEIGHTINCHES: 65 in
IV/PV OW: 0.94
LA ID, A-P, ES: 53 mm
LA diam end sys: 53 mm
LA diam index: 2.78 cm/m2
LA vol A4C: 56.3 ml
LA vol index: 34.4 mL/m2
LAVOL: 65.6 mL
LDCA: 3.8 cm2
LVOT diameter: 22 mm
MV Dec: 454
MVPG: 2 mmHg
MVPKAVEL: 126 m/s
MVPKEVEL: 73.7 m/s
P 1/2 time: 522 ms
PW: 11.6 mm — AB (ref 0.6–1.1)
TAPSE: 22.9 mm
TRMAXVEL: 316 cm/s
WEIGHTICAEL: 2740.76 [oz_av]

## 2017-02-01 LAB — GLUCOSE, CAPILLARY: GLUCOSE-CAPILLARY: 112 mg/dL — AB (ref 65–99)

## 2017-02-01 LAB — TROPONIN I
TROPONIN I: 0.05 ng/mL — AB (ref <0.03)
Troponin I: 0.06 ng/mL (ref <0.03)
Troponin I: 0.05 ng/mL (ref <0.03)

## 2017-02-01 LAB — PROTIME-INR
INR: 2.22
Prothrombin Time: 24.4 seconds — ABNORMAL HIGH (ref 11.4–15.2)

## 2017-02-01 MED ORDER — FUROSEMIDE 10 MG/ML IJ SOLN
80.0000 mg | Freq: Two times a day (BID) | INTRAMUSCULAR | Status: DC
  Administered 2017-02-01 – 2017-02-02 (×3): 80 mg via INTRAVENOUS
  Filled 2017-02-01 (×4): qty 8

## 2017-02-01 MED ORDER — UMECLIDINIUM BROMIDE 62.5 MCG/INH IN AEPB
1.0000 | INHALATION_SPRAY | Freq: Every day | RESPIRATORY_TRACT | Status: DC
  Administered 2017-02-01 – 2017-02-03 (×3): 1 via RESPIRATORY_TRACT
  Filled 2017-02-01: qty 7

## 2017-02-01 MED ORDER — PERFLUTREN LIPID MICROSPHERE
1.0000 mL | INTRAVENOUS | Status: AC | PRN
  Administered 2017-02-01: 3 mL via INTRAVENOUS
  Filled 2017-02-01: qty 10

## 2017-02-01 MED ORDER — FLUTICASONE FUROATE-VILANTEROL 200-25 MCG/INH IN AEPB
1.0000 | INHALATION_SPRAY | Freq: Every day | RESPIRATORY_TRACT | Status: DC
  Administered 2017-02-01 – 2017-02-03 (×3): 1 via RESPIRATORY_TRACT
  Filled 2017-02-01: qty 28

## 2017-02-01 MED ORDER — WARFARIN SODIUM 2 MG PO TABS
2.0000 mg | ORAL_TABLET | Freq: Once | ORAL | Status: AC
  Administered 2017-02-01: 2 mg via ORAL
  Filled 2017-02-01: qty 1

## 2017-02-01 MED ORDER — ALBUTEROL SULFATE (2.5 MG/3ML) 0.083% IN NEBU
2.5000 mg | INHALATION_SOLUTION | RESPIRATORY_TRACT | Status: DC | PRN
  Filled 2017-02-01: qty 3

## 2017-02-01 NOTE — Progress Notes (Signed)
[place] for warfarin Indication: atrial fibrillation  Allergies  Allergen Reactions  . Benadryl [Diphenhydramine Hcl] Nausea And Vomiting    Patient Measurements: Height: 5\' 5"  (165.1 cm) Weight: 171 lb 4.8 oz (77.7 kg) IBW/kg (Calculated) : 61.5  Vital Signs: Temp: 98.6 F (37 C) (01/12 0634) Temp Source: Oral (01/12 0634) BP: 124/42 (01/12 0634) Pulse Rate: 73 (01/12 0856)  Labs: Recent Labs    01/30/17 1254 01/31/17 1557 02/01/17 0426  HGB 12.2* 12.5*  --   HCT 37.2* 36.9*  --   PLT 115* 120*  --   LABPROT  --  22.5* 24.4*  INR  --  2.00 2.22  CREATININE 1.53* 1.50* 1.41*    Estimated Creatinine Clearance: 42.2 mL/min (A) (by C-G formula based on SCr of 1.41 mg/dL (H)).   Medical History: Past Medical History:  Diagnosis Date  . Atrial fibrillation ([place])   . CAD (coronary artery disease) 1996   status post PCI of the RCA   . Community acquired pneumonia 08/24/2015  . Congestive heart failure, unspecified   . COPD (chronic obstructive pulmonary disease) (HCC)    Pt on home O2 at night and PRN, unsure of date of diagnosis.  . Diabetes mellitus, type 2 ([place])   . DJD (degenerative joint disease)   . GERD (gastroesophageal reflux disease)   . Gout   . HTN (hypertension)   . 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 Nephrolithiasis   . Other primary cardiomyopathies   . Personal history of DVT (deep vein thrombosis)   . Prostatitis   . Shingles     Medications:  Medications Prior to Admission  Medication Sig Dispense Refill Last Dose  . acetaminophen (TYLENOL) 500 MG tablet Take 500 mg by mouth every 6 (six) hours as needed for mild pain or moderate pain.   Past Week at Unknown time  . albuterol (ACCUNEB) 1.25 MG/3ML nebulizer solution Take 3 mLs by nebulization 4 (four) times daily.  2 01/31/2017 at Unknown time  . albuterol (PROAIR HFA) 108 (90 Base) MCG/ACT inhaler Inhale 1-2 puffs into the lungs every 6 (six)  hours as needed for wheezing or shortness of breath. 8.5 g 1 01/31/2017 at Unknown time  . alfuzosin (UROXATRAL) 10 MG 24 hr tablet Take 10 mg by mouth daily with breakfast.    01/31/2017 at Unknown time  . allopurinol (ZYLOPRIM) 100 MG tablet Take 1 tablet (100 mg total) daily by mouth. 90 tablet 0 01/30/2017 at Unknown time  . atorvastatin (LIPITOR) 10 MG tablet Take 1 tablet (10 mg total) by mouth at bedtime. 90 tablet 1 01/30/2017 at Unknown time  . carvedilol (COREG) 12.5 MG tablet Take 1 tablet (12.5 mg total) by mouth 2 (two) times daily with a meal. 60 tablet 6 01/31/2017 at 2000  . cetirizine (ZYRTEC) 10 MG tablet Take 10 mg by mouth daily.   01/31/2017 at Unknown time  . digoxin (LANOXIN) 0.125 MG tablet Take 0.5 tablets (0.0625 mg total) daily by mouth. 45 tablet 0 01/31/2017 at Unknown time  . fluticasone (FLONASE) 50 MCG/ACT nasal spray Place 2 sprays into both nostrils at bedtime as needed for allergies. 16 g 3 01/30/2017 at Unknown time  . Fluticasone-Umeclidin-Vilant (TRELEGY ELLIPTA) 100-62.5-25 MCG/INH AEPB Inhale 1 puff daily into the lungs. 1 each 0 01/30/2017 at Unknown time  . furosemide (LASIX) 40 MG tablet Take 1.5 tablets (60 mg total) by mouth 2 (two) times daily. 90 tablet 11 01/31/2017 at Unknown time  .  warfarin (COUMADIN) 1 MG tablet Take as directed by [facility] (Patient taking differently: Take 2-3 mg by mouth. Take as directed by [facility] - Take 3 mg on Mondays then take 2 mg every other day of the week) 70 tablet 3 01/30/2017 at 2000   Scheduled:  . albuterol  1.25 mg Nebulization QID  . alfuzosin  10 mg Oral Q breakfast  . allopurinol  100 mg Oral Daily  . atorvastatin  10 mg Oral QHS  . carvedilol  12.5 mg Oral BID WC  . digoxin  0.0625 mg Oral Daily  . fluticasone furoate-vilanterol  1 puff Inhalation Daily  . furosemide  40 mg Intravenous BID  . loratadine  10 mg Oral Daily  . sodium chloride flush  3 mL Intravenous Q12H  . umeclidinium bromide  1 puff  Inhalation Daily  . Warfarin - Pharmacist Dosing Inpatient   Does not apply q1800   Assessment: 16 yoM with PMH COPD, CAD, AICD, Afib on warfarin, presents with hypoxia. Pharmacy consulted to continue warfarin while patient admitted   Baseline INR therapeutic but borderline low at 2.0  Prior anticoagulation: warfarin 2 mg daily except 3 mg on Mondays; LD 1/10  Significant events:  Today, 02/01/2017:  INR therapeutic  No bleeding issues reported  On heart healthy diet  Goal of Therapy: INR 2-3  Plan:  Warfarin 2 mg PO tonight at 18:00  Daily INR  CBC at least q72 hr while on warfarin  Monitor for signs of bleeding or thrombosis  Clayburn Pert, PharmD, BCPS Pager: [phone] 02/01/2017  9:31 AM

## 2017-02-01 NOTE — Progress Notes (Addendum)
Spoke with pt states "he oftens falls at home, he live alone, only have elderly siblings that stop in at times. He has low sat in am at time at home, dizzy and weak, has impaired vision in left eye that has changed over time, may need help at discharge." MD updated, orders noted. SRP, RN

## 2017-02-01 NOTE — Progress Notes (Signed)
Patient had a 14 beat run of PVC's. MD notified. Patient asymptomatic. Will continue to monitor patient closely.

## 2017-02-01 NOTE — Progress Notes (Signed)
PROGRESS NOTE    Troy [name]  RXV:[id number] DOB: [birth year] DOA: 01/31/2017 PCP: Caren Macadam, MD    Brief Narrative:  Troy [name] is a 77 y.o. male with medical history significant of COPD on PRN O2, CAD, ischemic CM, AICD, A.Fib on coumadin.  Patient woke up this AM, O2 sats in upper 80s.  Episodes of lightheadedness and near syncope over the past week.  Symptoms worse with exertion.  Associated DOE worse than baseline.  Presented to ED.   ED Course: Desats down to upper 70s with ambulation on 2L.  CXR shows mild pulm edema.     Assessment & Plan:   Principal Problem:   Acute on chronic respiratory failure with hypoxia (HCC) Active Problems:   Biventricular implantable cardioverter-defibrillator in situ   Chronic atrial fibrillation (HCC)   COPD (chronic obstructive pulmonary disease) (HCC)   Chronic kidney disease, stage 3 (HCC)   Acute on chronic systolic CHF (congestive heart failure) (HCC)   #1 acute on chronic respiratory failure with hypoxia secondary to acute on chronic systolic heart failure/ s/pBiV ICD Patient presented with worsening shortness of breath chest x-ray consistent with interstitial edema.  Patient noted to be hypoxic with sats in the 70s on 2 L nasal cannula.  Patient with some clinical improvement however not at baseline.  Output has not been recorded properly.  2D echo pending.  Cardiac enzymes every 6 hours x3.  Current weight is 77. 7 kg.  Increase Lasix 80 mg IV every 12 hours.  Strict I's and O's.  Daily weights. Continue coreg, lipitor, digoxin.  Follow.    #2 chronic atrial fibrillation Continue Coreg and digoxin for rate control.  INR is 2.22.  Coumadin for anticoagulation.  Follow.  3.  COPD Stable.  Continue ellipta.  Albuterol nebs.  4.  Chronic kidney disease stage III Stable.   DVT prophylaxis: Coumadin Code Status: Full Family Communication: Updated patient.  No family at bedside. Disposition Plan: Hopefully home  once clinically improved and medically stable.   Consultants:   None  Procedures:  2D echo pending 02/01/2017  Chest x-ray 01/31/2017  Antimicrobials:   None   Subjective: Patient sitting up in chair.  Patient states shortness of breath has improved.  No chest pain.  Objective: Vitals:   01/31/17 2241 02/01/17 0634 02/01/17 0856 02/01/17 1145  BP: (!) 114/53 (!) 124/42    Pulse: 70 67 73 60  Resp: 20 20 18 17   Temp: 98.8 F (37.1 C) 98.6 F (37 C)    TempSrc: Oral Oral    SpO2: 100% 94% 95% 98%  Weight: 77.8 kg (171 lb 8.3 oz) 77.7 kg (171 lb 4.8 oz)    Height: 5\' 5"  (1.651 m)       Intake/Output Summary (Last 24 hours) at 02/01/2017 1222 Last data filed at 02/01/2017 1114 Gross per 24 hour  Intake 480 ml  Output -  Net 480 ml   Filed Weights   01/31/17 2241 02/01/17 0634  Weight: 77.8 kg (171 lb 8.3 oz) 77.7 kg (171 lb 4.8 oz)    Examination:  General exam: NAD.  Respiratory system: Bibasilar crackles. Decreased BS in bases. Respiratory effort normal. Cardiovascular system: S1 & S2 heard, RRR. No JVD, murmurs, rubs, gallops or clicks. Trace edema. Gastrointestinal system: Abdomen is nondistended, soft and nontender. No organomegaly or masses felt. Normal bowel sounds heard. Central nervous system: Alert and oriented. No focal neurological deficits. Extremities: Symmetric 5 x 5 power. Skin: No rashes, lesions or  ulcers Psychiatry: Judgement and insight appear normal. Mood & affect appropriate.     Data Reviewed: I have personally reviewed following labs and imaging studies  CBC: Recent Labs  Lab 01/30/17 1254 01/31/17 1557  WBC 8.1 7.1  NEUTROABS  --  4.9  HGB 12.2* 12.5*  HCT 37.2* 36.9*  MCV 96.9 97.4  PLT 115* 283*   Basic Metabolic Panel: Recent Labs  Lab 01/30/17 1254 01/31/17 1557 02/01/17 0426  NA 137 137 137  K 2.7* 2.9* 4.1  CL 95* 98* 100*  CO2 30 31 31   GLUCOSE 136* 166* 155*  BUN 19 19 19   CREATININE 1.53* 1.50* 1.41*    CALCIUM 8.4* 8.5* 8.4*   GFR: Estimated Creatinine Clearance: 42.2 mL/min (A) (by C-G formula based on SCr of 1.41 mg/dL (H)). Liver Function Tests: No results for input(s): AST, ALT, ALKPHOS, BILITOT, PROT, ALBUMIN in the last 168 hours. No results for input(s): LIPASE, AMYLASE in the last 168 hours. No results for input(s): AMMONIA in the last 168 hours. Coagulation Profile: Recent Labs  Lab 01/31/17 1557 02/01/17 0426  INR 2.00 2.22   Cardiac Enzymes: Recent Labs  Lab 02/01/17 0857  TROPONINI 0.06*   BNP (last 3 results) No results for input(s): PROBNP in the last 8760 hours. HbA1C: No results for input(s): HGBA1C in the last 72 hours. CBG: Recent Labs  Lab 01/30/17 1244 02/01/17 0759  GLUCAP 132* 112*   Lipid Profile: No results for input(s): CHOL, HDL, LDLCALC, TRIG, CHOLHDL, LDLDIRECT in the last 72 hours. Thyroid Function Tests: No results for input(s): TSH, T4TOTAL, FREET4, T3FREE, THYROIDAB in the last 72 hours. Anemia Panel: No results for input(s): VITAMINB12, FOLATE, FERRITIN, TIBC, IRON, RETICCTPCT in the last 72 hours. Sepsis Labs: No results for input(s): PROCALCITON, LATICACIDVEN in the last 168 hours.  No results found for this or any previous visit (from the past 240 hour(s)).       Radiology Studies: Dg Chest Port 1 View  Result Date: 01/31/2017 CLINICAL DATA:  77 y/o  M; cough. EXAM: PORTABLE CHEST 1 VIEW COMPARISON:  08/14/2016 chest radiograph FINDINGS: Stable cardiac silhouette given projection and technique. 3 lead AICD. Aortic atherosclerosis with calcification. Reticular opacities in the lung bases and peripheral linear opacities, likely mild interstitial edema. No consolidation. No pleural effusion or pneumothorax. No acute osseous abnormality is evident. IMPRESSION: Mild interstitial edema.  No consolidation.  Aortic atherosclerosis. Electronically Signed   By: Kristine Garbe M.D.   On: 01/31/2017 19:05        Scheduled  Meds: . albuterol  1.25 mg Nebulization QID  . alfuzosin  10 mg Oral Q breakfast  . allopurinol  100 mg Oral Daily  . atorvastatin  10 mg Oral QHS  . carvedilol  12.5 mg Oral BID WC  . digoxin  0.0625 mg Oral Daily  . fluticasone furoate-vilanterol  1 puff Inhalation Daily  . furosemide  40 mg Intravenous BID  . loratadine  10 mg Oral Daily  . sodium chloride flush  3 mL Intravenous Q12H  . umeclidinium bromide  1 puff Inhalation Daily  . warfarin  2 mg Oral ONCE-1800  . Warfarin - Pharmacist Dosing Inpatient   Does not apply q1800   Continuous Infusions: . sodium chloride       LOS: 0 days    Time spent: 35 mins    Irine Seal, MD [facility] Pager [phone]  If 7PM-7AM, please contact night-coverage [email or web address] Password [facility] 02/01/2017, 12:22 PM

## 2017-02-01 NOTE — Progress Notes (Signed)
  Echocardiogram 2D Echocardiogram has been performed.  Shaan Rhoads G Karalyne Nusser 02/01/2017, 3:15 PM

## 2017-02-02 DIAGNOSIS — Z95810 Presence of automatic (implantable) cardiac defibrillator: Secondary | ICD-10-CM

## 2017-02-02 DIAGNOSIS — I509 Heart failure, unspecified: Secondary | ICD-10-CM

## 2017-02-02 LAB — BASIC METABOLIC PANEL
ANION GAP: 8 (ref 5–15)
BUN: 16 mg/dL (ref 6–20)
CO2: 31 mmol/L (ref 22–32)
Calcium: 8.4 mg/dL — ABNORMAL LOW (ref 8.9–10.3)
Chloride: 99 mmol/L — ABNORMAL LOW (ref 101–111)
Creatinine, Ser: 1.32 mg/dL — ABNORMAL HIGH (ref 0.61–1.24)
GFR calc Af Amer: 58 mL/min — ABNORMAL LOW (ref >60)
GFR, EST NON AFRICAN AMERICAN: 50 mL/min — AB (ref >60)
GLUCOSE: 116 mg/dL — AB (ref 65–99)
Potassium: 3.3 mmol/L — ABNORMAL LOW (ref 3.5–5.1)
SODIUM: 138 mmol/L (ref 135–145)

## 2017-02-02 LAB — GLUCOSE, CAPILLARY: Glucose-Capillary: 117 mg/dL — ABNORMAL HIGH (ref 65–99)

## 2017-02-02 LAB — CBC
HCT: 36 % — ABNORMAL LOW (ref 39.0–52.0)
Hemoglobin: 11.9 g/dL — ABNORMAL LOW (ref 13.0–17.0)
MCH: 32.4 pg (ref 26.0–34.0)
MCHC: 33.1 g/dL (ref 30.0–36.0)
MCV: 98.1 fL (ref 78.0–100.0)
PLATELETS: 131 10*3/uL — AB (ref 150–400)
RBC: 3.67 MIL/uL — ABNORMAL LOW (ref 4.22–5.81)
RDW: 14.9 % (ref 11.5–15.5)
WBC: 6.2 10*3/uL (ref 4.0–10.5)

## 2017-02-02 LAB — PROTIME-INR
INR: 2.19
PROTHROMBIN TIME: 24.2 s — AB (ref 11.4–15.2)

## 2017-02-02 MED ORDER — ALBUTEROL SULFATE (2.5 MG/3ML) 0.083% IN NEBU
1.2500 mg | INHALATION_SOLUTION | Freq: Two times a day (BID) | RESPIRATORY_TRACT | Status: DC
  Administered 2017-02-02 – 2017-02-03 (×2): 1.25 mg via RESPIRATORY_TRACT

## 2017-02-02 MED ORDER — POTASSIUM CHLORIDE CRYS ER 20 MEQ PO TBCR
40.0000 meq | EXTENDED_RELEASE_TABLET | Freq: Every day | ORAL | Status: DC
  Administered 2017-02-02 – 2017-02-03 (×2): 40 meq via ORAL
  Filled 2017-02-02 (×2): qty 2

## 2017-02-02 MED ORDER — WARFARIN SODIUM 2 MG PO TABS
2.0000 mg | ORAL_TABLET | Freq: Once | ORAL | Status: AC
  Administered 2017-02-02: 2 mg via ORAL
  Filled 2017-02-02: qty 1

## 2017-02-02 NOTE — Consult Note (Addendum)
Primary Physician: Primary Cardiologist:  Lovena Le  Asked to see by Dr Grandville Silos for near syncope and dyspnea  HPI: Pt is a 77 yo with a history of CAD and chronic systolic CHF  (s/p biV ICD)    Seen by Beckie Salts in clinic on 12/24/16  Doing OK at time  Also a hsitory of asymptomatic atrial fib (on anticoagulation) Pt also with a history of DM, GERD, HTN, DVT  He presented dto ED 1/11  Woke up  O2 sats in 80s  Lightheaded  Near syncope  Worseing dyspnea on exertion  CXR with pulmonary edema   Patient said this was sudden  Had been feeling good  Denies CP  Says he did not do anything different with his diet.    Lasix started  Pt continued on home meds for COPD   Lasix increased yesterday to 80 bid   Echo yesterday showed LVEF 25 to 30%   (down from reproted  35 to 40% in August 2018   Past Medical History:  Diagnosis Date  . Atrial fibrillation ([place])   . CAD (coronary artery disease) 1996   status post PCI of the RCA   . Community acquired pneumonia 08/24/2015  . Congestive heart failure, unspecified   . COPD (chronic obstructive pulmonary disease) (HCC)    Pt on home O2 at night and PRN, unsure of date of diagnosis.  . Diabetes mellitus, type 2 ([place])   . DJD (degenerative joint disease)   . GERD (gastroesophageal reflux disease)   . Gout   . HTN (hypertension)   . 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 Nephrolithiasis   . Other primary cardiomyopathies   . Personal history of DVT (deep vein thrombosis)   . Prostatitis   . Shingles     Medications Prior to Admission  Medication Sig Dispense Refill  . acetaminophen (TYLENOL) 500 MG tablet Take 500 mg by mouth every 6 (six) hours as needed for mild pain or moderate pain.    Marland Kitchen albuterol (ACCUNEB) 1.25 MG/3ML nebulizer solution Take 3 mLs by nebulization 4 (four) times daily.  2  . albuterol (PROAIR HFA) 108 (90 Base) MCG/ACT inhaler Inhale 1-2 puffs into the lungs every 6 (six) hours as needed for wheezing or shortness of  breath. 8.5 g 1  . alfuzosin (UROXATRAL) 10 MG 24 hr tablet Take 10 mg by mouth daily with breakfast.     . allopurinol (ZYLOPRIM) 100 MG tablet Take 1 tablet (100 mg total) daily by mouth. 90 tablet 0  . atorvastatin (LIPITOR) 10 MG tablet Take 1 tablet (10 mg total) by mouth at bedtime. 90 tablet 1  . carvedilol (COREG) 12.5 MG tablet Take 1 tablet (12.5 mg total) by mouth 2 (two) times daily with a meal. 60 tablet 6  . cetirizine (ZYRTEC) 10 MG tablet Take 10 mg by mouth daily.    . digoxin (LANOXIN) 0.125 MG tablet Take 0.5 tablets (0.0625 mg total) daily by mouth. 45 tablet 0  . fluticasone (FLONASE) 50 MCG/ACT nasal spray Place 2 sprays into both nostrils at bedtime as needed for allergies. 16 g 3  . Fluticasone-Umeclidin-Vilant (TRELEGY ELLIPTA) 100-62.5-25 MCG/INH AEPB Inhale 1 puff daily into the lungs. 1 each 0  . furosemide (LASIX) 40 MG tablet Take 1.5 tablets (60 mg total) by mouth 2 (two) times daily. 90 tablet 11  . warfarin (COUMADIN) 1 MG tablet Take as directed by [facility] (Patient taking differently: Take 2-3 mg by mouth. Take as directed  by [facility] Clinic - Take 3 mg on Mondays then take 2 mg every other day of the week) 70 tablet 3     . albuterol  1.25 mg Nebulization BID  . alfuzosin  10 mg Oral Q breakfast  . allopurinol  100 mg Oral Daily  . atorvastatin  10 mg Oral QHS  . carvedilol  12.5 mg Oral BID WC  . digoxin  0.0625 mg Oral Daily  . fluticasone furoate-vilanterol  1 puff Inhalation Daily  . furosemide  80 mg Intravenous BID  . loratadine  10 mg Oral Daily  . potassium chloride  40 mEq Oral Daily  . sodium chloride flush  3 mL Intravenous Q12H  . umeclidinium bromide  1 puff Inhalation Daily  . warfarin  2 mg Oral ONCE-1800  . Warfarin - Pharmacist Dosing Inpatient   Does not apply q1800    Infusions: . sodium chloride      Allergies  Allergen Reactions  . Benadryl [Diphenhydramine Hcl] Nausea And Vomiting    Social History    Socioeconomic History  . Marital status: Single    Spouse name: Not on file  . Number of children: 0  . Years of education: Not on file  . Highest education level: Not on file  Social Needs  . Financial resource strain: Not on file  . Food insecurity - worry: Not on file  . Food insecurity - inability: Not on file  . Transportation needs - medical: Not on file  . Transportation needs - non-medical: Not on file  Occupational History  . Occupation: Retired from Chief of Staff  . Smoking status: Former Smoker    Packs/day: 4.00    Years: 50.00    Pack years: 200.00    Types: Cigarettes    Last attempt to quit: 01/22/1980    Years since quitting: 37.0  . Smokeless tobacco: Never Used  Substance and Sexual Activity  . Alcohol use: No    Alcohol/week: 0.0 oz    Comment: denies  . Drug use: No  . Sexual activity: No  Other Topics Concern  . Not on file  Social History Narrative   Lives alone.  Single.  No children.  Ambulates with a cane.  Has a deceased brother and is estranged from sisters.    Family History  Problem Relation Age of Onset  . Heart disease Father   . Cancer Father        LUNG  . Colon cancer Mother   . Stroke Paternal Uncle   . Heart attack Neg Hx   . Hyperlipidemia Neg Hx   . Hypertension Neg Hx     REVIEW OF SYSTEMS:  All systems reviewed  Negative to the above problem except as noted above.    PHYSICAL EXAM: Vitals:   02/02/17 0914 02/02/17 0957  BP:  (!) 130/58  Pulse:  70  Resp:    Temp:    SpO2: 96%      Intake/Output Summary (Last 24 hours) at 02/02/2017 1106 Last data filed at 02/02/2017 0900 Gross per 24 hour  Intake 1080 ml  Output 2850 ml  Net -1770 ml    General:  Well appearing. No respiratory difficulty HEENT: normal Neck: supple. no JVD. Carotids 2+ bilat; no bruits. No lymphadenopathy or thryomegaly appreciated. Cor: PMI nondisplaced. Regular rate & rhythm. No rubs, gallops or murmurs. Lungs:  clear Abdomen: soft, nontender, nondistended. No hepatosplenomegaly. No bruits or masses. Good bowel sounds. Extremities: no cyanosis, clubbing, rash,  edema Neuro: alert & oriented x 3, cranial nerves grossly intact. moves all 4 extremities w/o difficulty. Affect pleasant.  ECG:  AV biventricular paced  72 bpm  Occasional PVC    Results for orders placed or performed during the hospital encounter of 01/31/17 (from the past 24 hour(s))  Troponin I (q 6hr x 3)     Status: Abnormal   Collection Time: 02/01/17  2:43 PM  Result Value Ref Range   Troponin I 0.05 (HH) <0.03 ng/mL  Troponin I (q 6hr x 3)     Status: Abnormal   Collection Time: 02/01/17  8:22 PM  Result Value Ref Range   Troponin I 0.05 (HH) <0.03 ng/mL  Basic metabolic panel     Status: Abnormal   Collection Time: 02/02/17  4:57 AM  Result Value Ref Range   Sodium 138 135 - 145 mmol/L   Potassium 3.3 (L) 3.5 - 5.1 mmol/L   Chloride 99 (L) 101 - 111 mmol/L   CO2 31 22 - 32 mmol/L   Glucose, Bld 116 (H) 65 - 99 mg/dL   BUN 16 6 - 20 mg/dL   Creatinine, Ser 1.32 (H) 0.61 - 1.24 mg/dL   Calcium 8.4 (L) 8.9 - 10.3 mg/dL   GFR calc non Af Amer 50 (L) >60 mL/min   GFR calc Af Amer 58 (L) >60 mL/min   Anion gap 8 5 - 15  Protime-INR     Status: Abnormal   Collection Time: 02/02/17  4:57 AM  Result Value Ref Range   Prothrombin Time 24.2 (H) 11.4 - 15.2 seconds   INR 2.19   CBC     Status: Abnormal   Collection Time: 02/02/17  4:57 AM  Result Value Ref Range   WBC 6.2 4.0 - 10.5 K/uL   RBC 3.67 (L) 4.22 - 5.81 MIL/uL   Hemoglobin 11.9 (L) 13.0 - 17.0 g/dL   HCT 36.0 (L) 39.0 - 52.0 %   MCV 98.1 78.0 - 100.0 fL   MCH 32.4 26.0 - 34.0 pg   MCHC 33.1 30.0 - 36.0 g/dL   RDW 14.9 11.5 - 15.5 %   Platelets 131 (L) 150 - 400 K/uL  Glucose, capillary     Status: Abnormal   Collection Time: 02/02/17  8:13 AM  Result Value Ref Range   Glucose-Capillary 117 (H) 65 - 99 mg/dL   Dg Chest Port 1 View  Result Date:  01/31/2017 CLINICAL DATA:  77 y/o  M; cough. EXAM: PORTABLE CHEST 1 VIEW COMPARISON:  08/14/2016 chest radiograph FINDINGS: Stable cardiac silhouette given projection and technique. 3 lead AICD. Aortic atherosclerosis with calcification. Reticular opacities in the lung bases and peripheral linear opacities, likely mild interstitial edema. No consolidation. No pleural effusion or pneumothorax. No acute osseous abnormality is evident. IMPRESSION: Mild interstitial edema.  No consolidation.  Aortic atherosclerosis. Electronically Signed   By: Kristine Garbe M.D.   On: 01/31/2017 19:05     ASSESSMENT:  77 yo with history of CAD and chronic systolic CHF and PAF He was admitted yesterday with sudden SOB   FOund to be in CHF    1  CHF   He has diuresed about 2 L since admit Currently comfortable  He denies having CP  (did have prior to intervention in the 1990s) I have reivewed echo from yesterday as well as Aug 2018   I am not convinced LVEF is signif different between the two studies Probably low 30s on both  And, he did  have regional wall motion changes in the inferior /inferolateral walls then  ? Is what threw him into CHF exacerbation  He has PAF but rare ? If having more He was just seen though on 12/4 and has rare PAF on device It is not unreasonable to consider Lexiscan myovue to r/o large area of ischemia (he has not had pain)  Inferior wall is probably scar.    Will schedule for tomorrow  Continue IV lasix through today then back off to PO  Continue other meds   2  CAD  As above  3  HL  Continue statin  4  PAF  Keep on coumadin

## 2017-02-02 NOTE — Progress Notes (Signed)
PROGRESS NOTE    Troy [name]  GDJ:[id number] DOB: [birth year] DOA: 01/31/2017 PCP: Caren Macadam, MD    Brief Narrative:  Troy [name] is a 77 y.o. male with medical history significant of COPD on PRN O2, CAD, ischemic CM, AICD, A.Fib on coumadin.  Patient woke up this AM, O2 sats in upper 80s.  Episodes of lightheadedness and near syncope over the past week.  Symptoms worse with exertion.  Associated DOE worse than baseline.  Presented to ED.   ED Course: Desats down to upper 70s with ambulation on 2L.  CXR shows mild pulm edema.     Assessment & Plan:   Principal Problem:   Acute on chronic respiratory failure with hypoxia (HCC) Active Problems:   Biventricular implantable cardioverter-defibrillator in situ   Chronic atrial fibrillation (HCC)   COPD (chronic obstructive pulmonary disease) (HCC)   Chronic kidney disease, stage 3 (HCC)   Acute on chronic systolic CHF (congestive heart failure) (HCC)   #1 acute on chronic respiratory failure with hypoxia secondary to acute on chronic systolic heart failure/ s/pBiV ICD Patient presented with worsening shortness of breath chest x-ray consistent with interstitial edema.  Patient noted to be hypoxic with sats in the 70s on 2 L nasal cannula.  Patient improving clinically.  Noted to have a urine output of 2.850 L over the past 24 hours.  Current weight is 77.2 kg from 77.7 kg.  Patient is -2.130 L during this hospitalization.  2D echo with a EF of 25-30%, global hypokinesis with akinesis of the inferior lateral wall.  Overall severely reduced LV function.  Grade 1 diastolic dysfunction.  Due to decreased ejection fraction and wall motion abnormalities will consult with cardiology for further evaluation and management.  Strict I's and O's.  Daily weights. Continue coreg, lipitor, digoxin.  Follow.    #2 chronic atrial fibrillation Continue Coreg and digoxin for rate control.  INR is 2.19.  Coumadin for anticoagulation.   Follow.  3.  COPD Stable.  Continue ellipta.  Albuterol nebs.  4.  Chronic kidney disease stage III Stable.  5.  Hypokalemia Secondary to diuresis.  Replete.   DVT prophylaxis: Coumadin Code Status: Full Family Communication: Updated patient.  No family at bedside. Disposition Plan: Hopefully home once clinically improved and medically stable.   Consultants:   Cardiology pending  Procedures:  2D echo 02/01/2017  Chest x-ray 01/31/2017  Antimicrobials:   None   Subjective: Patient sitting up in chair.  States shortness of breath has improved.  Denies any chest pain.    Objective: Vitals:   02/01/17 2100 02/02/17 0445 02/02/17 0914 02/02/17 0957  BP: (!) 129/42 (!) 124/44  (!) 130/58  Pulse: 63 60  70  Resp: 16 16    Temp: 98.8 F (37.1 C) 98.4 F (36.9 C)    TempSrc: Oral Oral    SpO2: 96% 92% 96%   Weight:  77.2 kg (170 lb 3.1 oz)    Height:        Intake/Output Summary (Last 24 hours) at 02/02/2017 1323 Last data filed at 02/02/2017 1310 Gross per 24 hour  Intake 840 ml  Output 3150 ml  Net -2310 ml   Filed Weights   01/31/17 2241 02/01/17 0634 02/02/17 0445  Weight: 77.8 kg (171 lb 8.3 oz) 77.7 kg (171 lb 4.8 oz) 77.2 kg (170 lb 3.1 oz)    Examination:  General exam: NAD.  Respiratory system: Decreased breath sounds in the bases.  No wheezing. Respiratory effort  normal. Cardiovascular system: Regular rate and rhythm no murmurs rubs or gallops.  No lower extremity edema.  No JVD.  Gastrointestinal system: Abdomen is soft, nontender, nondistended, positive bowel sounds.  No hepatosplenomegaly.   Central nervous system: Alert and oriented. No focal neurological deficits. Extremities: Symmetric 5 x 5 power. Skin: No rashes, lesions or ulcers Psychiatry: Judgement and insight appear normal. Mood & affect appropriate.     Data Reviewed: I have personally reviewed following labs and imaging studies  CBC: Recent Labs  Lab 01/30/17 1254  01/31/17 1557 02/02/17 0457  WBC 8.1 7.1 6.2  NEUTROABS  --  4.9  --   HGB 12.2* 12.5* 11.9*  HCT 37.2* 36.9* 36.0*  MCV 96.9 97.4 98.1  PLT 115* 120* 810*   Basic Metabolic Panel: Recent Labs  Lab 01/30/17 1254 01/31/17 1557 02/01/17 0426 02/02/17 0457  NA 137 137 137 138  K 2.7* 2.9* 4.1 3.3*  CL 95* 98* 100* 99*  CO2 30 31 31 31   GLUCOSE 136* 166* 155* 116*  BUN 19 19 19 16   CREATININE 1.53* 1.50* 1.41* 1.32*  CALCIUM 8.4* 8.5* 8.4* 8.4*   GFR: Estimated Creatinine Clearance: 44.9 mL/min (A) (by C-G formula based on SCr of 1.32 mg/dL (H)). Liver Function Tests: No results for input(s): AST, ALT, ALKPHOS, BILITOT, PROT, ALBUMIN in the last 168 hours. No results for input(s): LIPASE, AMYLASE in the last 168 hours. No results for input(s): AMMONIA in the last 168 hours. Coagulation Profile: Recent Labs  Lab 01/31/17 1557 02/01/17 0426 02/02/17 0457  INR 2.00 2.22 2.19   Cardiac Enzymes: Recent Labs  Lab 02/01/17 0857 02/01/17 1443 02/01/17 2022  TROPONINI 0.06* 0.05* 0.05*   BNP (last 3 results) No results for input(s): PROBNP in the last 8760 hours. HbA1C: No results for input(s): HGBA1C in the last 72 hours. CBG: Recent Labs  Lab 01/30/17 1244 02/01/17 0759 02/02/17 0813  GLUCAP 132* 112* 117*   Lipid Profile: No results for input(s): CHOL, HDL, LDLCALC, TRIG, CHOLHDL, LDLDIRECT in the last 72 hours. Thyroid Function Tests: No results for input(s): TSH, T4TOTAL, FREET4, T3FREE, THYROIDAB in the last 72 hours. Anemia Panel: No results for input(s): VITAMINB12, FOLATE, FERRITIN, TIBC, IRON, RETICCTPCT in the last 72 hours. Sepsis Labs: No results for input(s): PROCALCITON, LATICACIDVEN in the last 168 hours.  No results found for this or any previous visit (from the past 240 hour(s)).       Radiology Studies: Dg Chest Port 1 View  Result Date: 01/31/2017 CLINICAL DATA:  77 y/o  M; cough. EXAM: PORTABLE CHEST 1 VIEW COMPARISON:  08/14/2016  chest radiograph FINDINGS: Stable cardiac silhouette given projection and technique. 3 lead AICD. Aortic atherosclerosis with calcification. Reticular opacities in the lung bases and peripheral linear opacities, likely mild interstitial edema. No consolidation. No pleural effusion or pneumothorax. No acute osseous abnormality is evident. IMPRESSION: Mild interstitial edema.  No consolidation.  Aortic atherosclerosis. Electronically Signed   By: Kristine Garbe M.D.   On: 01/31/2017 19:05        Scheduled Meds: . albuterol  1.25 mg Nebulization BID  . alfuzosin  10 mg Oral Q breakfast  . allopurinol  100 mg Oral Daily  . atorvastatin  10 mg Oral QHS  . carvedilol  12.5 mg Oral BID WC  . digoxin  0.0625 mg Oral Daily  . fluticasone furoate-vilanterol  1 puff Inhalation Daily  . furosemide  80 mg Intravenous BID  . loratadine  10 mg Oral Daily  .  potassium chloride  40 mEq Oral Daily  . sodium chloride flush  3 mL Intravenous Q12H  . umeclidinium bromide  1 puff Inhalation Daily  . warfarin  2 mg Oral ONCE-1800  . Warfarin - Pharmacist Dosing Inpatient   Does not apply q1800   Continuous Infusions: . sodium chloride       LOS: 1 day    Time spent: 35 mins    Irine Seal, MD [facility] Pager [phone]  If 7PM-7AM, please contact night-coverage [email or web address] Password [email or web address] 02/02/2017, 1:23 PM

## 2017-02-02 NOTE — Progress Notes (Signed)
[place] for warfarin Indication: atrial fibrillation  Allergies  Allergen Reactions  . Benadryl [Diphenhydramine Hcl] Nausea And Vomiting    Patient Measurements: Height: 5\' 5"  (165.1 cm) Weight: 170 lb 3.1 oz (77.2 kg) IBW/kg (Calculated) : 61.5  Vital Signs: Temp: 98.4 F (36.9 C) (01/13 0445) Temp Source: Oral (01/13 0445) BP: 124/44 (01/13 0445) Pulse Rate: 60 (01/13 0445)  Labs: Recent Labs    01/30/17 1254 01/31/17 1557 02/01/17 0426 02/01/17 0857 02/01/17 1443 02/01/17 2022 02/02/17 0457  HGB 12.2* 12.5*  --   --   --   --  11.9*  HCT 37.2* 36.9*  --   --   --   --  36.0*  PLT 115* 120*  --   --   --   --  131*  LABPROT  --  22.5* 24.4*  --   --   --  24.2*  INR  --  2.00 2.22  --   --   --  2.19  CREATININE 1.53* 1.50* 1.41*  --   --   --  1.32*  TROPONINI  --   --   --  0.06* 0.05* 0.05*  --     Estimated Creatinine Clearance: 44.9 mL/min (A) (by C-G formula based on SCr of 1.32 mg/dL (H)).   Medical History: Past Medical History:  Diagnosis Date  . Atrial fibrillation ([place])   . CAD (coronary artery disease) 1996   status post PCI of the RCA   . Community acquired pneumonia 08/24/2015  . Congestive heart failure, unspecified   . COPD (chronic obstructive pulmonary disease) (HCC)    Pt on home O2 at night and PRN, unsure of date of diagnosis.  . Diabetes mellitus, type 2 ([place])   . DJD (degenerative joint disease)   . GERD (gastroesophageal reflux disease)   . Gout   . HTN (hypertension)   . 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 Nephrolithiasis   . Other primary cardiomyopathies   . Personal history of DVT (deep vein thrombosis)   . Prostatitis   . Shingles     Medications:  Medications Prior to Admission  Medication Sig Dispense Refill Last Dose  . acetaminophen (TYLENOL) 500 MG tablet Take 500 mg by mouth every 6 (six) hours as needed for mild pain or moderate pain.   Past Week at Unknown time  .  albuterol (ACCUNEB) 1.25 MG/3ML nebulizer solution Take 3 mLs by nebulization 4 (four) times daily.  2 01/31/2017 at Unknown time  . albuterol (PROAIR HFA) 108 (90 Base) MCG/ACT inhaler Inhale 1-2 puffs into the lungs every 6 (six) hours as needed for wheezing or shortness of breath. 8.5 g 1 01/31/2017 at Unknown time  . alfuzosin (UROXATRAL) 10 MG 24 hr tablet Take 10 mg by mouth daily with breakfast.    01/31/2017 at Unknown time  . allopurinol (ZYLOPRIM) 100 MG tablet Take 1 tablet (100 mg total) daily by mouth. 90 tablet 0 01/30/2017 at Unknown time  . atorvastatin (LIPITOR) 10 MG tablet Take 1 tablet (10 mg total) by mouth at bedtime. 90 tablet 1 01/30/2017 at Unknown time  . carvedilol (COREG) 12.5 MG tablet Take 1 tablet (12.5 mg total) by mouth 2 (two) times daily with a meal. 60 tablet 6 01/31/2017 at 2000  . cetirizine (ZYRTEC) 10 MG tablet Take 10 mg by mouth daily.   01/31/2017 at Unknown time  . digoxin (LANOXIN) 0.125 MG tablet Take 0.5 tablets (0.0625 mg total) daily by  mouth. 45 tablet 0 01/31/2017 at Unknown time  . fluticasone (FLONASE) 50 MCG/ACT nasal spray Place 2 sprays into both nostrils at bedtime as needed for allergies. 16 g 3 01/30/2017 at Unknown time  . Fluticasone-Umeclidin-Vilant (TRELEGY ELLIPTA) 100-62.5-25 MCG/INH AEPB Inhale 1 puff daily into the lungs. 1 each 0 01/30/2017 at Unknown time  . furosemide (LASIX) 40 MG tablet Take 1.5 tablets (60 mg total) by mouth 2 (two) times daily. 90 tablet 11 01/31/2017 at Unknown time  . warfarin (COUMADIN) 1 MG tablet Take as directed by [facility] Clinic (Patient taking differently: Take 2-3 mg by mouth. Take as directed by Coumadin Clinic - Take 3 mg on Mondays then take 2 mg every other day of the week) 70 tablet 3 01/30/2017 at 2000   Scheduled:  . albuterol  1.25 mg Nebulization BID  . alfuzosin  10 mg Oral Q breakfast  . allopurinol  100 mg Oral Daily  . atorvastatin  10 mg Oral QHS  . carvedilol  12.5 mg Oral BID WC  . digoxin   0.0625 mg Oral Daily  . fluticasone furoate-vilanterol  1 puff Inhalation Daily  . furosemide  80 mg Intravenous BID  . loratadine  10 mg Oral Daily  . potassium chloride  40 mEq Oral Daily  . sodium chloride flush  3 mL Intravenous Q12H  . umeclidinium bromide  1 puff Inhalation Daily  . Warfarin - Pharmacist Dosing Inpatient   Does not apply q1800   Assessment: 17 yoM with PMH COPD, CAD, AICD, Afib on warfarin, presents with hypoxia. Pharmacy consulted to continue warfarin while patient admitted   Baseline INR was therapeutic but borderline low at 2.0  Prior anticoagulation: warfarin 2 mg daily except 3 mg on Mondays; LD 1/10  Significant events:  Today, 02/02/2017:  INR therapeutic  H/H low but relatively stable  Pltc slightly low but >100K and improving  No bleeding issues reported  On heart healthy diet  Goal of Therapy: INR 2-3  Plan:  Warfarin 2 mg PO tonight at 18:00 as per home regimen  Daily INR  CBC at least q72 hr while on warfarin  Monitor for signs of bleeding or thrombosis  Clayburn Pert, PharmD, BCPS Pager: [phone] 02/02/2017  9:54 AM

## 2017-02-02 NOTE — Evaluation (Signed)
Physical Therapy Evaluation Patient Details Name: Troy [name] MRN: [id number] DOB: 1940/05/01 Today's Date: 02/02/2017   History of Present Illness  77 yo male admitted with acute on chronic respiratory failure, dizziness. Hx of CAD, A fib, CHF, COPD-has home O2, DM, pacemaker    Clinical Impression  On eval, pt required Min assist for mobility. He walked ~150 feet while using his cane and the hallway handrail. He is unsteady. O2 sat level 90% on 2L [place] O2 after ambulation. Pt presents with general weakness, decreased activity tolerance, and impaired gait and balane. Recommend HHPT f/u. Will follow and progress activity as tolerated during hospital stay.     Follow Up Recommendations Home health PT    Equipment Recommendations  None recommended by PT(pt stated he already has a walker)    Recommendations for Other Services       Precautions / Restrictions Precautions Precautions: Fall Precaution Comments: monitor O2.  Restrictions Weight Bearing Restrictions: No      Mobility  Bed Mobility               General bed mobility comments: oob in recliner  Transfers Overall transfer level: Needs assistance   Transfers: Sit to/from Stand Sit to Stand: Min guard         General transfer comment: close guard for safety.   Ambulation/Gait Ambulation/Gait assistance: Min guard Ambulation Distance (Feet): 150 Feet Assistive device: Straight cane(hallway handrail) Gait Pattern/deviations: Step-through pattern;Decreased stride length;Drifts right/left;Staggering right;Staggering left     General Gait Details: Assist to stabilize throughout distance. Pt required 2 hand support for most of distance so he may be better suited for RW use if he will agree to it. O2 sat 90% on 2L [place] after ambulation.   Stairs            Wheelchair Mobility    Modified Rankin (Stroke Patients Only)       Balance Overall balance assessment: Needs assistance            Standing balance-[name]: Poor                               Pertinent Vitals/Pain Pain Assessment: No/denies pain    Home Living                        Prior Function                 Hand Dominance        Extremity/Trunk Assessment   Upper Extremity Assessment Upper Extremity Assessment: Generalized weakness    Lower Extremity Assessment Lower Extremity Assessment: Generalized weakness    Cervical / Trunk Assessment Cervical / Trunk Assessment: Normal  Communication      Cognition Arousal/Alertness: Awake/alert Behavior During Therapy: WFL for tasks assessed/performed Overall Cognitive Status: Within Functional Limits for tasks assessed                                        General Comments      Exercises     Assessment/Plan    PT Assessment Patient needs continued PT services  PT Problem List Decreased strength;Decreased balance;Decreased mobility;Decreased activity tolerance       PT Treatment Interventions DME instruction;Gait training;Functional mobility training;Therapeutic activities;Balance training;Patient/family education;Therapeutic exercise    PT Goals (Current goals can be  found in the Care Plan section)  Acute Rehab PT Goals Patient Stated Goal: to breathe better. home.  PT Goal Formulation: With patient Time For Goal Achievement: 02/16/17 Potential to Achieve Goals: Good    Frequency Min 3X/week   Barriers to discharge        Co-evaluation               AM-PAC PT "6 Clicks" Daily Activity  Outcome Measure Difficulty turning over in bed (including adjusting bedclothes, sheets and blankets)?: None Difficulty moving from lying on back to sitting on the side of the bed? : None Difficulty sitting down on and standing up from a chair with arms (e.g., wheelchair, bedside commode, etc,.)?: A Little Help needed moving to and from a bed to chair (including a wheelchair)?: A  Little Help needed walking in hospital room?: A Little Help needed climbing 3-5 steps with a railing? : A Little 6 Click Score: 20    End of Session Equipment Utilized During Treatment: Gait belt Activity Tolerance: Patient tolerated treatment well Patient left: in chair;with call bell/phone within reach   PT Visit Diagnosis: Muscle weakness (generalized) (M62.81);Difficulty in walking, not elsewhere classified (R26.2)    Time: 6886-4847 PT Time Calculation (min) (ACUTE ONLY): 11 min   Charges:   PT Evaluation $PT Eval Moderate Complexity: 1 Mod     PT G Codes:         Weston Anna, MPT Pager: [phone]

## 2017-02-02 NOTE — Progress Notes (Signed)
Per PT- Dr. from [facility]?) reduced his Albuterol to 2 times per day (BID). Home med list indicated 4 times per day. PT agrees that 2 times per day is working. PT BBS are clear - (dim in bases), 96% on 2 lpm [place] (as used at home), no respiratory distress at this time- PT has good strong productive cough (per PT mucus is clear).

## 2017-02-03 ENCOUNTER — Telehealth: Payer: Self-pay | Admitting: Family Medicine

## 2017-02-03 ENCOUNTER — Ambulatory Visit (HOSPITAL_COMMUNITY)
Admit: 2017-02-03 | Discharge: 2017-02-03 | Disposition: A | Discharge status: Home / Self Care | Payer: Medicare Other | Attending: Internal Medicine | Admitting: Internal Medicine

## 2017-02-03 DIAGNOSIS — I5023 Acute on chronic systolic (congestive) heart failure: Secondary | ICD-10-CM

## 2017-02-03 LAB — BASIC METABOLIC PANEL
ANION GAP: 8 (ref 5–15)
BUN: 17 mg/dL (ref 6–20)
CALCIUM: 8.7 mg/dL — AB (ref 8.9–10.3)
CO2: 31 mmol/L (ref 22–32)
Chloride: 98 mmol/L — ABNORMAL LOW (ref 101–111)
Creatinine, Ser: 1.36 mg/dL — ABNORMAL HIGH (ref 0.61–1.24)
GFR calc Af Amer: 56 mL/min — ABNORMAL LOW (ref >60)
GFR calc non Af Amer: 49 mL/min — ABNORMAL LOW (ref >60)
GLUCOSE: 107 mg/dL — AB (ref 65–99)
Potassium: 3.8 mmol/L (ref 3.5–5.1)
Sodium: 137 mmol/L (ref 135–145)

## 2017-02-03 LAB — CBC
HEMATOCRIT: 36.6 % — AB (ref 39.0–52.0)
Hemoglobin: 12.4 g/dL — ABNORMAL LOW (ref 13.0–17.0)
MCH: 33.1 pg (ref 26.0–34.0)
MCHC: 33.9 g/dL (ref 30.0–36.0)
MCV: 97.6 fL (ref 78.0–100.0)
Platelets: 145 10*3/uL — ABNORMAL LOW (ref 150–400)
RBC: 3.75 MIL/uL — AB (ref 4.22–5.81)
RDW: 14.7 % (ref 11.5–15.5)
WBC: 6.6 10*3/uL (ref 4.0–10.5)

## 2017-02-03 LAB — NM MYOCAR MULTI W/SPECT W/WALL MOTION / EF
Exercise duration (min): 4 min
LV sys vol: 148 mL
LVDIAVOL: 208 mL (ref 62–150)
NUC STRESS TID: 1.08
Rest HR: 66 {beats}/min

## 2017-02-03 LAB — GLUCOSE, CAPILLARY: GLUCOSE-CAPILLARY: 105 mg/dL — AB (ref 65–99)

## 2017-02-03 LAB — PROTIME-INR
INR: 2.12
Prothrombin Time: 23.5 seconds — ABNORMAL HIGH (ref 11.4–15.2)

## 2017-02-03 MED ORDER — TECHNETIUM TC 99M TETROFOSMIN IV KIT
30.0000 | PACK | Freq: Once | INTRAVENOUS | Status: AC | PRN
  Administered 2017-02-03: 30 via INTRAVENOUS

## 2017-02-03 MED ORDER — REGADENOSON 0.4 MG/5ML IV SOLN
INTRAVENOUS | Status: AC
  Administered 2017-02-03: 0.4 mg via INTRAVENOUS
  Filled 2017-02-03: qty 5

## 2017-02-03 MED ORDER — WARFARIN SODIUM 3 MG PO TABS
3.0000 mg | ORAL_TABLET | Freq: Once | ORAL | Status: AC
  Administered 2017-02-03: 3 mg via ORAL
  Filled 2017-02-03: qty 1

## 2017-02-03 MED ORDER — FUROSEMIDE 40 MG PO TABS
80.0000 mg | ORAL_TABLET | Freq: Two times a day (BID) | ORAL | Status: DC
  Administered 2017-02-03: 80 mg via ORAL
  Filled 2017-02-03: qty 2

## 2017-02-03 MED ORDER — TECHNETIUM TC 99M TETROFOSMIN IV KIT
10.0000 | PACK | Freq: Once | INTRAVENOUS | Status: AC | PRN
  Administered 2017-02-03: 10 via INTRAVENOUS

## 2017-02-03 MED ORDER — REGADENOSON 0.4 MG/5ML IV SOLN
0.4000 mg | Freq: Once | INTRAVENOUS | Status: AC
  Administered 2017-02-03: 0.4 mg via INTRAVENOUS

## 2017-02-03 NOTE — Progress Notes (Signed)
Care link here for transport to [facility] nuc med

## 2017-02-03 NOTE — Progress Notes (Signed)
Progress Note  Patient Name: Troy [name] Date of Encounter: 02/03/2017  Primary Cardiologist:Taylor  Subjective   Breathing is OK    Inpatient Medications    Scheduled Meds: . albuterol  1.25 mg Nebulization BID  . alfuzosin  10 mg Oral Q breakfast  . allopurinol  100 mg Oral Daily  . atorvastatin  10 mg Oral QHS  . carvedilol  12.5 mg Oral BID WC  . digoxin  0.0625 mg Oral Daily  . fluticasone furoate-vilanterol  1 puff Inhalation Daily  . furosemide  80 mg Oral BID  . loratadine  10 mg Oral Daily  . potassium chloride  40 mEq Oral Daily  . sodium chloride flush  3 mL Intravenous Q12H  . umeclidinium bromide  1 puff Inhalation Daily  . warfarin  3 mg Oral ONCE-1800  . Warfarin - Pharmacist Dosing Inpatient   Does not apply q1800   Continuous Infusions: . sodium chloride     PRN Meds: sodium chloride, acetaminophen, albuterol, fluticasone, ondansetron (ZOFRAN) IV, sodium chloride flush   Vital Signs    Vitals:   02/02/17 2221 02/03/17 0459 02/03/17 0836 02/03/17 0849  BP: (!) 120/45 130/62    Pulse: 62 60 69   Resp: 20 16    Temp: 98.6 F (37 C) 98.3 F (36.8 C)    TempSrc: Oral Oral    SpO2: 98% 96%  95%  Weight:  171 lb 1.6 oz (77.6 kg)    Height:        Intake/Output Summary (Last 24 hours) at 02/03/2017 1414 Last data filed at 02/03/2017 0818 Gross per 24 hour  Intake 0 ml  Output 950 ml  Net -950 ml   Filed Weights   02/01/17 0634 02/02/17 0445 02/03/17 0459  Weight: 171 lb 4.8 oz (77.7 kg) 170 lb 3.1 oz (77.2 kg) 171 lb 1.6 oz (77.6 kg)    Telemetry    SR - Personally Reviewed  ECG      Physical Exam   GEN: No acute distress.   Neck: No JVD Cardiac: RRR, no murmurs, rubs, or gallops.  Respiratory: Clear to auscultation bilaterally. GI: Soft, nontender, non-distended  MS: No edema; No deformity. Neuro:  Nonfocal  Psych: Normal affect   Labs    Chemistry Recent Labs  Lab 02/01/17 0426 02/02/17 0457 02/03/17 0441  NA  137 138 137  K 4.1 3.3* 3.8  CL 100* 99* 98*  CO2 31 31 31   GLUCOSE 155* 116* 107*  BUN 19 16 17   CREATININE 1.41* 1.32* 1.36*  CALCIUM 8.4* 8.4* 8.7*  GFRNONAA 47* 50* 49*  GFRAA 54* 58* 56*  ANIONGAP 6 8 8      Hematology Recent Labs  Lab 01/31/17 1557 02/02/17 0457 02/03/17 0441  WBC 7.1 6.2 6.6  RBC 3.79* 3.67* 3.75*  HGB 12.5* 11.9* 12.4*  HCT 36.9* 36.0* 36.6*  MCV 97.4 98.1 97.6  MCH 33.0 32.4 33.1  MCHC 33.9 33.1 33.9  RDW 14.9 14.9 14.7  PLT 120* 131* 145*    Cardiac Enzymes Recent Labs  Lab 02/01/17 0857 02/01/17 1443 02/01/17 2022  TROPONINI 0.06* 0.05* 0.05*    Recent Labs  Lab 01/31/17 1614  TROPIPOC 0.05     BNP Recent Labs  Lab 01/31/17 1555  BNP 449.7*     DDimer No results for input(s): DDIMER in the last 168 hours.   Radiology    Nm Myocar Multi W/spect W/wall Motion / Ef  Result Date: 02/03/2017  There was no  ST segment deviation noted during stress.  No T wave inversion was noted during stress.  Defect 1: There is a small defect of moderate severity present in the apical anterior and apical septal location. This may be attributatble to ventricular pacing.  Defect 2: There is a medium defect of moderate severity present in the basal inferior, mid inferior and apical inferior location.  The left ventricular ejection fraction is severely decreased (<30%).  Findings consistent with prior myocardial infarction.  This is a high risk study due to reduced systolic function. No ischemia noted.     Cardiac Studies   Nuclear scan pending   Patient Profile     77 y.o. male hx of CAD and chronic systolic CHF  (s/o BiV ICD)   Doing well in December   Admitted now with CHF  (SOB, sats low) Denies CP   Echo with LVEF 30%    Assessment & Plan    1  Acute on chronic systoilic CHF   Pt responded quickly to lasix   I reviewed echo  I am not convinced of signif change from previous echo    Echo with hypokinesis of the  inferior/inferolateral wall  Myovue with evid of scar in infeiror distribution  No ischemia   I would continue po meds   Salt and fluid restrict   OK for d/c from cardiac standpoint with close outpt f/u    Will make sure he has appt.    Dorris Carnes   For questions or updates, please contact [facility] Please consult [email or web address] for contact info under Cardiology/STEMI.      Signed, Dorris Carnes, MD  02/03/2017, 2:14 PM

## 2017-02-03 NOTE — Progress Notes (Signed)
CSW consulted to assess "SNF v ALF." Attempted to meet with pt- at [facility] for procedure.  Reviewed chart- Home Health PT recommended per therapy eval- pt has home health with [facility] per chart. Will sign off unless new needs arise. Please re-consult CSW if needed.  Sharren Bridge, MSW, LCSW Clinical Social Work 02/03/2017 [phone]

## 2017-02-03 NOTE — Progress Notes (Signed)
SATURATION QUALIFICATIONS: (This note is used to comply with regulatory documentation for home oxygen)  Patient Saturations on Room Air at Rest = 88%  Patient Saturations on Room Air while Ambulating = 87%    

## 2017-02-03 NOTE — Progress Notes (Signed)
D/c to home w/ friend via w/c.All d/c instructions and belongings given w/ verbal understanding.

## 2017-02-03 NOTE — Discharge Summary (Signed)
Physician Discharge Summary  Troy [name] MBW:[id number] DOB: [birth year] DOA: 01/31/2017  PCP: Caren Macadam, MD  Admit date: 01/31/2017 Discharge date: 02/03/2017  Time spent: 60 minutes  Recommendations for Outpatient Follow-up:  1. Follow-up with Dr. Lovena Le in the outpatient setting with cardiology.  Office will call with an appointment time.   Discharge Diagnoses:  Principal Problem:   Acute on chronic respiratory failure with hypoxia (HCC) Active Problems:   Biventricular implantable cardioverter-defibrillator in situ   Chronic atrial fibrillation (HCC)   COPD (chronic obstructive pulmonary disease) (HCC)   Chronic kidney disease, stage 3 (HCC)   Acute on chronic systolic CHF (congestive heart failure) ([place])   Discharge Condition: stable and improved.  Diet recommendation: Heart Healthy  Filed Weights   02/01/17 0634 02/02/17 0445 02/03/17 0459  Weight: 77.7 kg (171 lb 4.8 oz) 77.2 kg (170 lb 3.1 oz) 77.6 kg (171 lb 1.6 oz)    History of present illness:  Per [name] is a 77 y.o. male with medical history significant of COPD on PRN O2, CAD, ischemic CM, AICD, A.Fib on coumadin.  Patient woke up the AM of admission, O2 sats in upper 80s.  Episodes of lightheadedness and near syncope over the past week.  Symptoms worse with exertion.  Associated DOE worse than baseline.  Presented to ED.   ED Course: Desats down to upper 70s with ambulation on 2L.  CXR shows mild pulm edema.    Hospital Course:  #1 acute on chronic respiratory failure with hypoxia secondary to acute on chronic systolic heart failure/ s/pBiV ICD Patient presented with worsening shortness of breath chest x-ray consistent with interstitial edema.  Patient noted to be hypoxic with sats in the 70s on 2 L nasal cannula.  Patient improved clinically.  Noted to be -3.480 L during the hospitalization.  Patient was placed on IV Lasix and diuresed well.  On day of discharge patient's  weight was 77.6 kg.  2D echo with a EF of 25-30%, global hypokinesis with akinesis of the inferior lateral wall.  Overall severely reduced LV function.  Grade 1 diastolic dysfunction.  Due to decreased ejection fraction and wall motion abnormalities cardiology was consulted.  Patient underwent Lexiscan Myoview on 02/03/2017 which showed no inducible ischemia, evidence of scar in the inferior distribution.  Cardiology recommended patient be transitioned to home regimen of oral diuretics with salt and fluid restriction.  It was felt per cardiology that patient was okay for discharge from a cardiac standpoint home with close outpatient follow-up.  Patient will be discharged home in stable and improved condition and will follow up with cardiology in the outpatient setting.    #2 chronic atrial fibrillation Was maintained on home regimen of Coreg and digoxin for rate control.  INR was therapeutic throughout the hospitalization.  Patient was maintained on home regimen of Coumadin.  Outpatient follow-up.    3.  COPD Patient maintained on ellipta.  Albuterol nebs.  4.  Chronic kidney disease stage III Remained stable.  5.  Hypokalemia Secondary to diuresis.  Repleted.    Procedures:  2D echo 02/01/2017  Chest x-ray 01/31/2017      Consultations:  Cardiology: Dr. Harrington Challenger 02/02/2017    Discharge Exam: Vitals:   02/03/17 0849 02/03/17 1500  BP:    Pulse:    Resp:    Temp:    SpO2: 95% (!) 87%    General: NAD Cardiovascular: RRR Respiratory: CTAB  Discharge Instructions   Discharge Instructions  Diet - low sodium heart healthy   Complete by:  As directed    Increase activity slowly   Complete by:  As directed      Allergies as of 02/03/2017      Reactions   Benadryl [diphenhydramine Hcl] Nausea And Vomiting      Medication List    TAKE these medications   acetaminophen 500 MG tablet Commonly known as:  TYLENOL Take 500 mg by mouth every 6 (six) hours as needed for  mild pain or moderate pain.   albuterol 1.25 MG/3ML nebulizer solution Commonly known as:  ACCUNEB Take 3 mLs by nebulization 4 (four) times daily.   albuterol 108 (90 Base) MCG/ACT inhaler Commonly known as:  PROAIR HFA Inhale 1-2 puffs into the lungs every 6 (six) hours as needed for wheezing or shortness of breath.   alfuzosin 10 MG 24 hr tablet Commonly known as:  UROXATRAL Take 10 mg by mouth daily with breakfast.   allopurinol 100 MG tablet Commonly known as:  ZYLOPRIM Take 1 tablet (100 mg total) daily by mouth.   atorvastatin 10 MG tablet Commonly known as:  LIPITOR Take 1 tablet (10 mg total) by mouth at bedtime.   carvedilol 12.5 MG tablet Commonly known as:  COREG Take 1 tablet (12.5 mg total) by mouth 2 (two) times daily with a meal.   cetirizine 10 MG tablet Commonly known as:  ZYRTEC Take 10 mg by mouth daily.   digoxin 0.125 MG tablet Commonly known as:  LANOXIN Take 0.5 tablets (0.0625 mg total) daily by mouth.   fluticasone 50 MCG/ACT nasal spray Commonly known as:  FLONASE Place 2 sprays into both nostrils at bedtime as needed for allergies.   Fluticasone-Umeclidin-Vilant 100-62.5-25 MCG/INH Aepb Commonly known as:  TRELEGY ELLIPTA Inhale 1 puff daily into the lungs.   furosemide 40 MG tablet Commonly known as:  LASIX Take 1.5 tablets (60 mg total) by mouth 2 (two) times daily.   warfarin 1 MG tablet Commonly known as:  COUMADIN Take as directed. If you are unsure how to take this medication, talk to your nurse or doctor. Original instructions:  Take as directed by [facility] What changed:    how much to take  how to take this  additional instructions      Allergies  Allergen Reactions  . Benadryl [Diphenhydramine Hcl] Nausea And Vomiting   Follow-up Information    [place] Follow up.   Specialty:  [place] Why:  RN NA SW Contact information: [place]  [address] [phone]        Evans Lance, MD Follow up.   Specialty:  Cardiology Why:  Office will contact you Contact information: [address] [place] [address] [phone]            The results of significant diagnostics from this hospitalization (including imaging, microbiology, ancillary and laboratory) are listed below for reference.    Significant Diagnostic Studies: Nm Myocar Multi W/spect W/wall Motion / Ef  Result Date: 02/03/2017  There was no ST segment deviation noted during stress.  No T wave inversion was noted during stress.  Defect 1: There is a small defect of moderate severity present in the apical anterior and apical septal location. This may be attributatble to ventricular pacing.  Defect 2: There is a medium defect of moderate severity present in the basal inferior, mid inferior and apical inferior location.  The left  ventricular ejection fraction is severely decreased (<30%).  Findings consistent with prior myocardial infarction.  This is a high risk study due to reduced systolic function. No ischemia noted.    Dg Chest Port 1 View  Result Date: 01/31/2017 CLINICAL DATA:  77 y/o  M; cough. EXAM: PORTABLE CHEST 1 VIEW COMPARISON:  08/14/2016 chest radiograph FINDINGS: Stable cardiac silhouette given projection and technique. 3 lead AICD. Aortic atherosclerosis with calcification. Reticular opacities in the lung bases and peripheral linear opacities, likely mild interstitial edema. No consolidation. No pleural effusion or pneumothorax. No acute osseous abnormality is evident. IMPRESSION: Mild interstitial edema.  No consolidation.  Aortic atherosclerosis. Electronically Signed   By: Kristine Garbe M.D.   On: 01/31/2017 19:05    Microbiology: No results found for this or any previous visit (from the past 240 hour(s)).   Labs: Basic Metabolic Panel: Recent Labs  Lab 01/30/17 1254 01/31/17 1557 02/01/17 0426  02/02/17 0457 02/03/17 0441  NA 137 137 137 138 137  K 2.7* 2.9* 4.1 3.3* 3.8  CL 95* 98* 100* 99* 98*  CO2 30 31 31 31 31   GLUCOSE 136* 166* 155* 116* 107*  BUN 19 19 19 16 17   CREATININE 1.53* 1.50* 1.41* 1.32* 1.36*  CALCIUM 8.4* 8.5* 8.4* 8.4* 8.7*   Liver Function Tests: No results for input(s): AST, ALT, ALKPHOS, BILITOT, PROT, ALBUMIN in the last 168 hours. No results for input(s): LIPASE, AMYLASE in the last 168 hours. No results for input(s): AMMONIA in the last 168 hours. CBC: Recent Labs  Lab 01/30/17 1254 01/31/17 1557 02/02/17 0457 02/03/17 0441  WBC 8.1 7.1 6.2 6.6  NEUTROABS  --  4.9  --   --   HGB 12.2* 12.5* 11.9* 12.4*  HCT 37.2* 36.9* 36.0* 36.6*  MCV 96.9 97.4 98.1 97.6  PLT 115* 120* 131* 145*   Cardiac Enzymes: Recent Labs  Lab 02/01/17 0857 02/01/17 1443 02/01/17 2022  TROPONINI 0.06* 0.05* 0.05*   BNP: BNP (last 3 results) Recent Labs    06/09/16 1602 08/14/16 1253 01/31/17 1555  BNP 1,356.7* 793.0* 449.7*    ProBNP (last 3 results) No results for input(s): PROBNP in the last 8760 hours.  CBG: Recent Labs  Lab 01/30/17 1244 02/01/17 0759 02/02/17 0813 02/03/17 0739  GLUCAP 132* 112* 117* 105*       Signed:  Irine Seal MD.  [facility] 02/03/2017, 4:03 PM

## 2017-02-03 NOTE — Discharge Instructions (Signed)

## 2017-02-03 NOTE — Progress Notes (Signed)
[place] for warfarin Indication: atrial fibrillation  Allergies  Allergen Reactions  . Benadryl [Diphenhydramine Hcl] Nausea And Vomiting    Patient Measurements: Height: 5\' 5"  (165.1 cm) Weight: 171 lb 1.6 oz (77.6 kg) IBW/kg (Calculated) : 61.5  Vital Signs: Temp: 98.3 F (36.8 C) (01/14 0459) Temp Source: Oral (01/14 0459) BP: 130/62 (01/14 0459) Pulse Rate: 69 (01/14 0836)  Labs: Recent Labs    01/31/17 1557 02/01/17 0426 02/01/17 0857 02/01/17 1443 02/01/17 2022 02/02/17 0457 02/03/17 0441  HGB 12.5*  --   --   --   --  11.9* 12.4*  HCT 36.9*  --   --   --   --  36.0* 36.6*  PLT 120*  --   --   --   --  131* 145*  LABPROT 22.5* 24.4*  --   --   --  24.2* 23.5*  INR 2.00 2.22  --   --   --  2.19 2.12  CREATININE 1.50* 1.41*  --   --   --  1.32* 1.36*  TROPONINI  --   --  0.06* 0.05* 0.05*  --   --     Estimated Creatinine Clearance: 43.7 mL/min (A) (by C-G formula based on SCr of 1.36 mg/dL (H)).  Assessment: 59 yoM with PMH COPD, CAD, AICD, Afib on warfarin, presents with hypoxia. Pharmacy consulted to continue warfarin while patient admitted   Baseline INR was therapeutic but borderline low at 2.0  Prior anticoagulation: warfarin 2 mg daily except 3 mg on Mondays; LD 1/10  Significant events:  Today, 02/03/2017:  INR therapeutic x 4  H/H relatively stable  Pltc slightly low but >100K and improving  No bleeding issues reported  On heart healthy diet  Goal of Therapy: INR 2-3  Plan:  Warfarin 3 mg PO tonight at 18:00 as per home regimen  Daily INR-may be able to decrease   CBC at least q72 hr while on warfarin  Monitor for signs of bleeding or thrombosis  Eudelia Bunch, Pharm.D. [phone] 02/03/2017 9:13 AM

## 2017-02-03 NOTE — Progress Notes (Signed)
OT Cancellation Note  Patient Details Name: Troy [name] MRN: [id number] DOB: 04-07-1940   Cancelled Treatment:    Reason Eval/Treat Not Completed: Patient at procedure or test/ unavailable  Pt gone to [facility] for test. Will check back on pt next day Kari Baars, [place]  Payton Mccallum D 02/03/2017, 11:02 AM

## 2017-02-03 NOTE — Progress Notes (Signed)
PROGRESS NOTE    Troy [name]  LKG:[id number] DOB: [birth year] DOA: 01/31/2017 PCP: Caren Macadam, MD    Brief Narrative:  Troy [name] is a 77 y.o. male with medical history significant of COPD on PRN O2, CAD, ischemic CM, AICD, A.Fib on coumadin.  Patient woke up this AM, O2 sats in upper 80s.  Episodes of lightheadedness and near syncope over the past week.  Symptoms worse with exertion.  Associated DOE worse than baseline.  Presented to ED.   ED Course: Desats down to upper 70s with ambulation on 2L.  CXR shows mild pulm edema.     Assessment & Plan:   Principal Problem:   Acute on chronic respiratory failure with hypoxia (HCC) Active Problems:   Biventricular implantable cardioverter-defibrillator in situ   Chronic atrial fibrillation (HCC)   COPD (chronic obstructive pulmonary disease) (HCC)   Chronic kidney disease, stage 3 (HCC)   Acute on chronic systolic CHF (congestive heart failure) (HCC)   #1 acute on chronic respiratory failure with hypoxia secondary to acute on chronic systolic heart failure/ s/pBiV ICD Patient presented with worsening shortness of breath chest x-ray consistent with interstitial edema.  Patient noted to be hypoxic with sats in the 70s on 2 L nasal cannula.  Patient improved clinically.  Noted to have a urine output of 1.950 L over the past 24 hours.  Current weight is 77.6 kg from 77.2 kg from 77.7 kg.  Patient is - 3.480 L during this hospitalization.  2D echo with a EF of 25-30%, global hypokinesis with akinesis of the inferior lateral wall.  Overall severely reduced LV function.  Grade 1 diastolic dysfunction.  Due to decreased ejection fraction and wall motion abnormalities cardiology was consulted.  Patient for [facility] today.  We will transition from IV Lasix to oral Lasix at a slightly increased dose from home dose and will place on Lasix 80 mg twice daily.  Strict I's and O's.  Daily weights. Continue coreg, lipitor,  digoxin.  Cardiology following.     #2 chronic atrial fibrillation Continue Coreg and digoxin for rate control.  INR is 2.12.  Coumadin for anticoagulation.  Follow.  3.  COPD Stable.  Continue ellipta.  Albuterol nebs.  4.  Chronic kidney disease stage III Stable.  5.  Hypokalemia Secondary to diuresis.  Repleted.   DVT prophylaxis: Coumadin Code Status: Full Family Communication: Updated patient.  No family at bedside. Disposition Plan: Hopefully home once clinically improved and medically stable with home health services.   Consultants:   Cardiology: Dr. Harrington Challenger 02/02/2017  Procedures:  2D echo 02/01/2017  Chest x-ray 01/31/2017  Antimicrobials:   None   Subjective: Patient sitting up in chair getting a breathing treatment.  Denies chest pain.  Denies shortness of breath.  Feels better than on admission.  Objective: Vitals:   02/02/17 2221 02/03/17 0459 02/03/17 0836 02/03/17 0849  BP: (!) 120/45 130/62    Pulse: 62 60 69   Resp: 20 16    Temp: 98.6 F (37 C) 98.3 F (36.8 C)    TempSrc: Oral Oral    SpO2: 98% 96%  95%  Weight:  77.6 kg (171 lb 1.6 oz)    Height:        Intake/Output Summary (Last 24 hours) at 02/03/2017 0900 Last data filed at 02/03/2017 0818 Gross per 24 hour  Intake 240 ml  Output 1950 ml  Net -1710 ml   Filed Weights   02/01/17 0634 02/02/17 0445 02/03/17 0459  Weight: 77.7 kg (171 lb 4.8 oz) 77.2 kg (170 lb 3.1 oz) 77.6 kg (171 lb 1.6 oz)    Examination:  General exam: NAD.  Getting nebulizer. Respiratory system: Clear to auscultation bilaterally.  No wheezes, no crackles, no rhonchi. Respiratory effort normal. Cardiovascular system: Regular rate and rhythm no murmurs rubs or gallops.  No lower extremity edema.  No JVD.  Gastrointestinal system: Abdomen is soft, nontender, nondistended, positive bowel sounds.  No hepatosplenomegaly.   Central nervous system: Alert and oriented. No focal neurological deficits. Extremities:  Symmetric 5 x 5 power. Skin: No rashes, lesions or ulcers Psychiatry: Judgement and insight appear normal. Mood & affect appropriate.     Data Reviewed: I have personally reviewed following labs and imaging studies  CBC: Recent Labs  Lab 01/30/17 1254 01/31/17 1557 02/02/17 0457 02/03/17 0441  WBC 8.1 7.1 6.2 6.6  NEUTROABS  --  4.9  --   --   HGB 12.2* 12.5* 11.9* 12.4*  HCT 37.2* 36.9* 36.0* 36.6*  MCV 96.9 97.4 98.1 97.6  PLT 115* 120* 131* 209*   Basic Metabolic Panel: Recent Labs  Lab 01/30/17 1254 01/31/17 1557 02/01/17 0426 02/02/17 0457 02/03/17 0441  NA 137 137 137 138 137  K 2.7* 2.9* 4.1 3.3* 3.8  CL 95* 98* 100* 99* 98*  CO2 30 31 31 31 31   GLUCOSE 136* 166* 155* 116* 107*  BUN 19 19 19 16 17   CREATININE 1.53* 1.50* 1.41* 1.32* 1.36*  CALCIUM 8.4* 8.5* 8.4* 8.4* 8.7*   GFR: Estimated Creatinine Clearance: 43.7 mL/min (A) (by C-G formula based on SCr of 1.36 mg/dL (H)). Liver Function Tests: No results for input(s): AST, ALT, ALKPHOS, BILITOT, PROT, ALBUMIN in the last 168 hours. No results for input(s): LIPASE, AMYLASE in the last 168 hours. No results for input(s): AMMONIA in the last 168 hours. Coagulation Profile: Recent Labs  Lab 01/31/17 1557 02/01/17 0426 02/02/17 0457 02/03/17 0441  INR 2.00 2.22 2.19 2.12   Cardiac Enzymes: Recent Labs  Lab 02/01/17 0857 02/01/17 1443 02/01/17 2022  TROPONINI 0.06* 0.05* 0.05*   BNP (last 3 results) No results for input(s): PROBNP in the last 8760 hours. HbA1C: No results for input(s): HGBA1C in the last 72 hours. CBG: Recent Labs  Lab 01/30/17 1244 02/01/17 0759 02/02/17 0813 02/03/17 0739  GLUCAP 132* 112* 117* 105*   Lipid Profile: No results for input(s): CHOL, HDL, LDLCALC, TRIG, CHOLHDL, LDLDIRECT in the last 72 hours. Thyroid Function Tests: No results for input(s): TSH, T4TOTAL, FREET4, T3FREE, THYROIDAB in the last 72 hours. Anemia Panel: No results for input(s): VITAMINB12,  FOLATE, FERRITIN, TIBC, IRON, RETICCTPCT in the last 72 hours. Sepsis Labs: No results for input(s): PROCALCITON, LATICACIDVEN in the last 168 hours.  No results found for this or any previous visit (from the past 240 hour(s)).       Radiology Studies: No results found.      Scheduled Meds: . albuterol  1.25 mg Nebulization BID  . alfuzosin  10 mg Oral Q breakfast  . allopurinol  100 mg Oral Daily  . atorvastatin  10 mg Oral QHS  . carvedilol  12.5 mg Oral BID WC  . digoxin  0.0625 mg Oral Daily  . fluticasone furoate-vilanterol  1 puff Inhalation Daily  . furosemide  80 mg Oral BID  . loratadine  10 mg Oral Daily  . potassium chloride  40 mEq Oral Daily  . sodium chloride flush  3 mL Intravenous Q12H  . umeclidinium bromide  1 puff Inhalation Daily  . Warfarin - Pharmacist Dosing Inpatient   Does not apply q1800   Continuous Infusions: . sodium chloride       LOS: 2 days    Time spent: 35 mins    Irine Seal, MD [facility] Pager [phone]  If 7PM-7AM, please contact night-coverage [email or web address] Password [email or web address] 02/03/2017, 9:00 AM

## 2017-02-03 NOTE — Progress Notes (Signed)
Tolerated Myoview well,  final report pending.  Kerin Ransom PA-C 02/03/2017 1:32 PM

## 2017-02-03 NOTE — Progress Notes (Signed)
Pharmacist Heart Failure Core Measure Documentation  Assessment: Troy [name] has an EF documented as 25-30% on 02/01/2017 by echo.  Rationale: Heart failure patients with left ventricular systolic dysfunction (LVSD) and an EF < 40% should be prescribed an angiotensin converting enzyme inhibitor (ACEI) or angiotensin receptor blocker (ARB) at discharge unless a contraindication is documented in the medical record.  This patient is not currently on an ACEI or ARB for HF.  This note is being placed in the record in order to provide documentation that a contraindication to the use of these agents is present for this encounter.  ACE Inhibitor or Angiotensin Receptor Blocker is contraindicated (specify all that apply)  []   ACEI allergy AND ARB allergy []   Angioedema []   Moderate or severe aortic stenosis []   Hyperkalemia []   Hypotension []   Renal artery stenosis [x]   Worsening renal function, preexisting renal disease or dysfunction  Eudelia Bunch, Pharm.D. [phone] 02/03/2017 11:08 AM

## 2017-02-03 NOTE — Telephone Encounter (Signed)
-----   Message from Caren Macadam, MD sent at 02/01/2017  7:41 AM EST ----- Please call and check on patient and see how he is doing. Thanks.  Gwen Her. Mannie Stabile, MD

## 2017-02-03 NOTE — Telephone Encounter (Signed)
Called patient regarding message below. No answer, left generic message for patient to return call.   

## 2017-02-03 NOTE — Telephone Encounter (Signed)
Will route this to Dr. Lamonte Sakai and Ria Comment as an [place] as pt is currently admitted at [facility].

## 2017-02-04 ENCOUNTER — Telehealth: Payer: Self-pay | Admitting: Interventional Cardiology

## 2017-02-04 NOTE — Telephone Encounter (Signed)
Patient calling and states that he had his labs drawn yesterday (CBC, BMET, INR) and also had another test done yesterday at the hospital (NM Myocar Multi W/Spect W/Wall Motion / EF) that he would like for [name] to review and give recommendations. Made patient aware that I would forward the information to [name] to make him aware.

## 2017-02-04 NOTE — Telephone Encounter (Signed)
New message     Patient calling to get his test results from yesterday

## 2017-02-05 NOTE — Telephone Encounter (Signed)
Called patient regarding message below. No answer, left generic message for patient to return call.   

## 2017-02-05 NOTE — Telephone Encounter (Signed)
Called and made patient aware that [name] reviewed his test and it showed a low EF, which is know, and that there was no ischemia noted on the test. Patient states that he has never experienced any chest pain. He states that in the past he would just become SOB. Patient denies any complaints at this time and states that he feels well. He states that he is just going to have to work on getting stronger. Patient advised to let us know if he develops any symptoms. Patient has hospital f/u appointment with [name] on 02/11/17.

## 2017-02-05 NOTE — Telephone Encounter (Signed)
Low EF which is known.  No ischemia noted.  Any further chest pain.  Further testing would depend on his sx.

## 2017-02-10 NOTE — Progress Notes (Signed)
Cardiology Office Note   Date:  02/11/2017   ID:  Troy [name], DOB 09/25/1940, MRN [id number]  PCP:  Patient, No Pcp Per    No chief complaint on file. chronic systolic heart failure   Wt Readings from Last 3 Encounters:  02/11/17 180 lb (81.6 kg)  02/03/17 171 lb 1.6 oz (77.6 kg)  01/30/17 180 lb (81.6 kg)       History of Present Illness: Troy [name] is a 77 y.o. male  who presents for chronic systolic HF, COPD.   He has a mixed CM, chronic class 2 CHF, s/p biv-ICD-PLACED 2012, chb, and PAF. CAD, PCI of RCA in 1996 .   Echo 11/22/13 with EF 20-25%.   Hospitalized in12/16 for acute on chronic systolic HF. Was neg 5 L with diuresis- discharged with oxygen and to SNF for rehab.   Heart failure managed medically.  Digoxin level of 1.8 was associated with nausea.   Hospitalized in 8/17 for presumed Lower GI bleed form diverticulosis. No colonoscopy was done.   ECHO in 2018 for branch retinal occlusion showed EF 35-40%.   He is using [place] oxygen at night.   He was admitted in Jan 2019 with acute on chronic hypoxic resp failure.  Hospital records show: "2D echo with a EF of 25-30%, global hypokinesis with akinesis of the inferior lateral wall. Overall severely reduced LV function. Grade 1 diastolic dysfunction. Due to decreased ejection fraction and wall motion abnormalities cardiology was consulted. Patient underwent Lexiscan Myoview on 02/03/2017 which showed no inducible ischemia, evidence of scar in the inferior distribution.  Cardiology recommended patient be transitioned to home regimen of oral diuretics with salt and fluid restriction."  Since leaving the hospital: Denies : Chest pain. Dizziness. Leg edema. Nitroglycerin use. Orthopnea. Palpitations. Paroxysmal nocturnal dyspnea. Shortness of breath. Syncope.       Past Medical History:  Diagnosis Date  . Atrial fibrillation ([place])   . CAD (coronary artery disease) 1996   status post PCI  of the RCA   . Community acquired pneumonia 08/24/2015  . Congestive heart failure, unspecified   . COPD (chronic obstructive pulmonary disease) (HCC)    Pt on home O2 at night and PRN, unsure of date of diagnosis.  . Diabetes mellitus, type 2 ([place])   . DJD (degenerative joint disease)   . GERD (gastroesophageal reflux disease)   . Gout   . HTN (hypertension)   . 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 Nephrolithiasis   . Other primary cardiomyopathies   . Personal history of DVT (deep vein thrombosis)   . Prostatitis   . Shingles     Past Surgical History:  Procedure Laterality Date  . BACK SURGERY    . CARDIAC DEFIBRILLATOR PLACEMENT    . CORONARY STENT PLACEMENT    . PACEMAKER INSERTION       Current Outpatient Medications  Medication Sig Dispense Refill  . acetaminophen (TYLENOL) 500 MG tablet Take 500 mg by mouth every 6 (six) hours as needed for mild pain or moderate pain.    Marland Kitchen albuterol (ACCUNEB) 1.25 MG/3ML nebulizer solution Take 3 mLs by nebulization 4 (four) times daily.  2  . albuterol (PROAIR HFA) 108 (90 Base) MCG/ACT inhaler Inhale 1-2 puffs into the lungs every 6 (six) hours as needed for wheezing or shortness of breath. 8.5 g 1  . alfuzosin (UROXATRAL) 10 MG 24 hr tablet Take 10 mg by mouth daily with breakfast.     . allopurinol (  ZYLOPRIM) 100 MG tablet Take 1 tablet (100 mg total) daily by mouth. 90 tablet 0  . atorvastatin (LIPITOR) 10 MG tablet Take 1 tablet (10 mg total) by mouth at bedtime. 90 tablet 1  . carvedilol (COREG) 12.5 MG tablet Take 1 tablet (12.5 mg total) by mouth 2 (two) times daily with a meal. 60 tablet 6  . cetirizine (ZYRTEC) 10 MG tablet Take 10 mg by mouth daily.    . digoxin (LANOXIN) 0.125 MG tablet Take 0.5 tablets (0.0625 mg total) daily by mouth. 45 tablet 0  . fluticasone (FLONASE) 50 MCG/ACT nasal spray Place 2 sprays into both nostrils at bedtime as needed for allergies. 16 g 3  . Fluticasone-Umeclidin-Vilant (TRELEGY ELLIPTA)  100-62.5-25 MCG/INH AEPB Inhale 1 puff daily into the lungs. 1 each 0  . furosemide (LASIX) 40 MG tablet Take 1.5 tablets (60 mg total) by mouth 2 (two) times daily. 90 tablet 11  . warfarin (COUMADIN) 1 MG tablet Take as directed by [facility] (Patient taking differently: Take 2-3 mg by mouth. Take as directed by [facility] - Take 3 mg on Mondays then take 2 mg every other day of the week) 70 tablet 3   No current facility-administered medications for this visit.     Allergies:   Benadryl [diphenhydramine hcl]    Social History:  The patient  reports that he quit smoking about 37 years ago. His smoking use included cigarettes. He has a 200.00 pack-year smoking history. he has never used smokeless tobacco. He reports that he does not drink alcohol or use drugs.   Family History:  The patient's family history includes Cancer in his father; Colon cancer in his mother; Heart disease in his father; Stroke in his paternal uncle.    ROS:  Please see the history of present illness.   Otherwise, review of systems are positive for pressure feeling in his head with rare dizziness- when he stands up.   All other systems are reviewed and negative.    PHYSICAL EXAM: VS:  BP 122/68   Pulse 66   Ht 5\' 5"  (1.651 m)   Wt 180 lb (81.6 kg)   SpO2 90%   BMI 29.95 kg/m  , BMI Body mass index is 29.95 kg/m. 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 Cardiac: RRR, premature beats; no murmurs, rubs, or gallops,no edema  Respiratory:  clear to auscultation bilaterally, normal work of breathing GI: soft, nontender, nondistended, + BS MS: no deformity or atrophy  Skin: warm and dry, no rash Neuro:  Strength and sensation are intact Psych: euthymic mood, full affect   Recent Labs: 06/09/2016: TSH 2.153 06/10/2016: Magnesium 1.9 09/19/2016: ALT 7 01/31/2017: B Natriuretic Peptide 449.7 02/03/2017:  BUN 17; Creatinine, Ser 1.36; Hemoglobin 12.4; Platelets 145; Potassium 3.8; Sodium 137   Lipid Panel    Component Value Date/Time   CHOL 135 09/19/2016 1454   TRIG 168 (H) 09/19/2016 1454   HDL 42 09/19/2016 1454   CHOLHDL 3.2 09/19/2016 1454   VLDL 34 (H) 09/19/2016 1454   LDLCALC 59 09/19/2016 1454     Other studies Reviewed: Additional studies/ records that were reviewed today with results demonstrating: Cr 1.36 in Jan 2019.   ASSESSMENT AND PLAN:  1. Chronic systolic heart failure: Appears euvolemic.  Daily weights.  OK to take som extra Lasix as needed for weight gain.  Limit salt intake. 2. CAD:  No angina on medical therapy.  No ischemia on recent stress test.  Prior RCA PCI. 3. CRI: Labs stable.   4. AFib: No palpitations.  Warfarin for stroke prevention.  Less 1% of the time is he in AFib by last EP note in 12/18.    Current medicines are reviewed at length with the patient today.  The patient concerns regarding his medicines were addressed.  The following changes have been made:  No change  Labs/ tests ordered today include:  No orders of the defined types were placed in this encounter.   Recommend 150 minutes/week of aerobic exercise Low fat, low carb, high fiber diet recommended  Disposition:   FU in 6 months   Signed, Larae Grooms, MD  02/11/2017 11:45 AM    [address], [place], [place]  [id number] Phone: [phone]; Fax: [phone]

## 2017-02-11 ENCOUNTER — Ambulatory Visit (INDEPENDENT_AMBULATORY_CARE_PROVIDER_SITE_OTHER): Payer: Medicare Other | Admitting: Interventional Cardiology

## 2017-02-11 ENCOUNTER — Encounter (INDEPENDENT_AMBULATORY_CARE_PROVIDER_SITE_OTHER): Payer: Self-pay

## 2017-02-11 ENCOUNTER — Encounter: Payer: Self-pay | Admitting: Interventional Cardiology

## 2017-02-11 VITALS — BP 122/68 | HR 66 | Ht 65.0 in | Wt 180.0 lb

## 2017-02-11 DIAGNOSIS — I25119 Atherosclerotic heart disease of native coronary artery with unspecified angina pectoris: Secondary | ICD-10-CM

## 2017-02-11 DIAGNOSIS — N183 Chronic kidney disease, stage 3 unspecified: Secondary | ICD-10-CM

## 2017-02-11 DIAGNOSIS — I482 Chronic atrial fibrillation, unspecified: Secondary | ICD-10-CM

## 2017-02-11 DIAGNOSIS — I5022 Chronic systolic (congestive) heart failure: Secondary | ICD-10-CM | POA: Diagnosis not present

## 2017-02-11 NOTE — Patient Instructions (Signed)

## 2017-02-13 ENCOUNTER — Ambulatory Visit (INDEPENDENT_AMBULATORY_CARE_PROVIDER_SITE_OTHER): Payer: Medicare Other | Admitting: *Deleted

## 2017-02-13 DIAGNOSIS — Z5181 Encounter for therapeutic drug level monitoring: Secondary | ICD-10-CM | POA: Diagnosis not present

## 2017-02-13 DIAGNOSIS — I482 Chronic atrial fibrillation, unspecified: Secondary | ICD-10-CM

## 2017-02-13 LAB — POCT INR: INR: 2.8

## 2017-02-13 NOTE — Patient Instructions (Signed)
Continue 2 tablets daily except 3 tablets on Mondays.    Recheck INR in 4 weeks

## 2017-02-25 ENCOUNTER — Other Ambulatory Visit: Payer: Self-pay | Admitting: Interventional Cardiology

## 2017-02-25 NOTE — Telephone Encounter (Signed)
This is a [place] pt °

## 2017-03-08 ENCOUNTER — Other Ambulatory Visit: Payer: Self-pay | Admitting: Family Medicine

## 2017-03-08 DIAGNOSIS — Z8679 Personal history of other diseases of the circulatory system: Secondary | ICD-10-CM

## 2017-03-08 DIAGNOSIS — E782 Mixed hyperlipidemia: Secondary | ICD-10-CM

## 2017-03-13 ENCOUNTER — Ambulatory Visit (INDEPENDENT_AMBULATORY_CARE_PROVIDER_SITE_OTHER): Payer: Medicare Other | Admitting: *Deleted

## 2017-03-13 DIAGNOSIS — I482 Chronic atrial fibrillation, unspecified: Secondary | ICD-10-CM

## 2017-03-13 DIAGNOSIS — Z5181 Encounter for therapeutic drug level monitoring: Secondary | ICD-10-CM

## 2017-03-13 LAB — POCT INR: INR: 3.3

## 2017-03-13 NOTE — Patient Instructions (Signed)
Hold coumadin tonight then resume 2 tablets daily except 3 tablets on Mondays.    Recheck INR in 3 weeks

## 2017-03-17 ENCOUNTER — Emergency Department (HOSPITAL_COMMUNITY): Payer: Medicare Other

## 2017-03-17 ENCOUNTER — Encounter (HOSPITAL_COMMUNITY): Payer: Self-pay | Admitting: Emergency Medicine

## 2017-03-17 ENCOUNTER — Emergency Department (HOSPITAL_COMMUNITY)
Admission: EM | Admit: 2017-03-17 | Discharge: 2017-03-17 | Disposition: A | Discharge status: Home / Self Care | Payer: Medicare Other | Attending: Emergency Medicine | Admitting: Emergency Medicine

## 2017-03-17 ENCOUNTER — Other Ambulatory Visit: Payer: Self-pay

## 2017-03-17 DIAGNOSIS — Z5321 Procedure and treatment not carried out due to patient leaving prior to being seen by health care provider: Secondary | ICD-10-CM | POA: Diagnosis not present

## 2017-03-17 DIAGNOSIS — R05 Cough: Secondary | ICD-10-CM | POA: Diagnosis not present

## 2017-03-17 NOTE — ED Notes (Signed)
PT called x3 from waiting room with no answer.

## 2017-03-17 NOTE — ED Notes (Signed)
PT called from [facility] for room and no answer.

## 2017-03-26 ENCOUNTER — Other Ambulatory Visit: Payer: Self-pay | Admitting: Emergency Medicine

## 2017-04-03 ENCOUNTER — Ambulatory Visit (INDEPENDENT_AMBULATORY_CARE_PROVIDER_SITE_OTHER): Payer: Medicare Other | Admitting: *Deleted

## 2017-04-03 DIAGNOSIS — I482 Chronic atrial fibrillation, unspecified: Secondary | ICD-10-CM

## 2017-04-03 DIAGNOSIS — Z5181 Encounter for therapeutic drug level monitoring: Secondary | ICD-10-CM | POA: Diagnosis not present

## 2017-04-03 LAB — POCT INR: INR: 4.1

## 2017-04-03 NOTE — Patient Instructions (Signed)
Hold coumadin tonight then decrease dose to 2 tablets daily. Has been on prednisone for bronchitis.  Still not feeling well.  May get more meds.  Recheck INR in 2 weeks

## 2017-04-17 ENCOUNTER — Ambulatory Visit (INDEPENDENT_AMBULATORY_CARE_PROVIDER_SITE_OTHER): Payer: Medicare Other | Admitting: *Deleted

## 2017-04-17 DIAGNOSIS — Z5181 Encounter for therapeutic drug level monitoring: Secondary | ICD-10-CM

## 2017-04-17 DIAGNOSIS — I482 Chronic atrial fibrillation, unspecified: Secondary | ICD-10-CM

## 2017-04-17 LAB — POCT INR: INR: 2.5

## 2017-04-17 NOTE — Patient Instructions (Signed)
Continue coumadin 2 tablets daily. Has been on prednisone for bronchitis. Finished a week ago  Recheck INR in 3 weeks

## 2017-05-01 ENCOUNTER — Telehealth: Payer: Self-pay | Admitting: Interventional Cardiology

## 2017-05-01 NOTE — Telephone Encounter (Signed)
New Message  April at [facility] is calling because the pt has an artery occlusion and they would like for him to have further testing. Please call  Office is closed on Friday, ok to call Monday

## 2017-05-01 NOTE — Telephone Encounter (Signed)
Attempted to return April's call at [facility], but the office was already closed. Will attempt to call back on Monday.

## 2017-05-05 NOTE — Telephone Encounter (Signed)
Returned call to April from [facility] who states that the patient has a left branch retinal artery occlusion and wanted to know if the patient had recent echo and carotid ultrasound ordered. Made her aware that the patient had carotid in August 2018 for BRAO and echo in January 2019. April states that she will call if they need anything further.

## 2017-05-08 ENCOUNTER — Ambulatory Visit (INDEPENDENT_AMBULATORY_CARE_PROVIDER_SITE_OTHER): Payer: Medicare Other | Admitting: *Deleted

## 2017-05-08 DIAGNOSIS — I482 Chronic atrial fibrillation, unspecified: Secondary | ICD-10-CM

## 2017-05-08 DIAGNOSIS — Z5181 Encounter for therapeutic drug level monitoring: Secondary | ICD-10-CM

## 2017-05-08 LAB — POCT INR: INR: 2.2

## 2017-05-08 NOTE — Patient Instructions (Signed)
Continue coumadin 2 tablets daily.  Recheck INR in 4 weeks 

## 2017-05-21 ENCOUNTER — Ambulatory Visit (INDEPENDENT_AMBULATORY_CARE_PROVIDER_SITE_OTHER): Payer: Medicare Other | Admitting: *Deleted

## 2017-05-21 DIAGNOSIS — I5023 Acute on chronic systolic (congestive) heart failure: Secondary | ICD-10-CM | POA: Diagnosis not present

## 2017-05-21 LAB — CUP PACEART INCLINIC DEVICE CHECK
Brady Statistic RA Percent Paced: 63 %
Date Time Interrogation Session: 20190501164511
HighPow Impedance: 43.4953
Implantable Lead Implant Date: 20120801
Implantable Lead Implant Date: 20120801
Implantable Lead Location: 753858
Implantable Lead Location: 753860
Lead Channel Impedance Value: 462.5 Ohm
Lead Channel Pacing Threshold Amplitude: 0.75 V
Lead Channel Pacing Threshold Amplitude: 1 V
Lead Channel Pacing Threshold Pulse Width: 0.5 ms
Lead Channel Sensing Intrinsic Amplitude: 5 mV
Lead Channel Setting Pacing Amplitude: 2 V
MDC IDC LEAD IMPLANT DT: 20120801
MDC IDC LEAD LOCATION: 753859
MDC IDC MSMT BATTERY REMAINING LONGEVITY: 19 mo
MDC IDC MSMT LEADCHNL LV IMPEDANCE VALUE: 837.5 Ohm
MDC IDC MSMT LEADCHNL LV PACING THRESHOLD AMPLITUDE: 1 V
MDC IDC MSMT LEADCHNL LV PACING THRESHOLD PULSEWIDTH: 0.5 ms
MDC IDC MSMT LEADCHNL LV PACING THRESHOLD PULSEWIDTH: 0.5 ms
MDC IDC MSMT LEADCHNL RA IMPEDANCE VALUE: 400 Ohm
MDC IDC MSMT LEADCHNL RA PACING THRESHOLD AMPLITUDE: 0.75 V
MDC IDC MSMT LEADCHNL RA PACING THRESHOLD PULSEWIDTH: 0.5 ms
MDC IDC MSMT LEADCHNL RA PACING THRESHOLD PULSEWIDTH: 0.5 ms
MDC IDC MSMT LEADCHNL RV PACING THRESHOLD AMPLITUDE: 0.75 V
MDC IDC MSMT LEADCHNL RV PACING THRESHOLD AMPLITUDE: 0.75 V
MDC IDC MSMT LEADCHNL RV PACING THRESHOLD PULSEWIDTH: 0.5 ms
MDC IDC MSMT LEADCHNL RV SENSING INTR AMPL: 12 mV
MDC IDC PG IMPLANT DT: 20120801
MDC IDC SET LEADCHNL LV PACING AMPLITUDE: 2 V
MDC IDC SET LEADCHNL LV PACING PULSEWIDTH: 0.5 ms
MDC IDC SET LEADCHNL RA PACING AMPLITUDE: 1.75 V
MDC IDC SET LEADCHNL RV PACING PULSEWIDTH: 0.5 ms
MDC IDC SET LEADCHNL RV SENSING SENSITIVITY: 0.5 mV
MDC IDC STAT BRADY RV PERCENT PACED: 90 %
Pulse Gen Serial Number: 7001284

## 2017-05-21 NOTE — Progress Notes (Signed)
CRT-D device check in office. Thresholds and sensing consistent with previous device measurements. Lead impedance trends stable over time. 6.2% AT/AF + Coumadin. No ventricular arrhythmia episodes recorded. Patient bi-ventricularly pacing 90% of the time. Device programmed with appropriate safety margins. Heart failure diagnostics reviewed and trends are stable for patient. No changes made this session. Estimated longevity 1.6 years. ROV w/ DC 08/20/17. ROV w/ GT 01/2018

## 2017-06-03 ENCOUNTER — Ambulatory Visit (INDEPENDENT_AMBULATORY_CARE_PROVIDER_SITE_OTHER): Payer: Medicare Other | Admitting: Emergency Medicine

## 2017-06-03 ENCOUNTER — Encounter: Payer: Self-pay | Admitting: Emergency Medicine

## 2017-06-03 DIAGNOSIS — J449 Chronic obstructive pulmonary disease, unspecified: Secondary | ICD-10-CM

## 2017-06-03 DIAGNOSIS — J301 Allergic rhinitis due to pollen: Secondary | ICD-10-CM | POA: Diagnosis not present

## 2017-06-03 NOTE — Patient Instructions (Signed)
Please continue Trelegy 1 inhalation once a day.  Remember to rinse and gargle after using this medication. Keep albuterol available to use either 1 nebulizer treatment or 2 puffs up to every 4 hours if needed for shortness of breath, wheezing, chest tightness. Please continue your Flonase nasal spray and Zyrtec as you are taking them. You would benefit from using her oxygen with exertion. Follow with Dr Lamonte Sakai in 6 months or sooner if you have any problems

## 2017-06-03 NOTE — Assessment & Plan Note (Signed)
Please continue your Flonase nasal spray and Zyrtec as you are taking them.

## 2017-06-03 NOTE — Assessment & Plan Note (Signed)
Please continue Trelegy 1 inhalation once a day.  Remember to rinse and gargle after using this medication. Keep albuterol available to use either 1 nebulizer treatment or 2 puffs up to every 4 hours if needed for shortness of breath, wheezing, chest tightness. You would benefit from using oxygen with exertion. Follow with Dr Lamonte Sakai in 6 months or sooner if you have any problems

## 2017-06-03 NOTE — Progress Notes (Signed)
Subjective:    Patient ID: Troy [name], male    DOB: [birth year], 77 y.o.   MRN: [id number]  HPI 77 yo former smoker (100 pk-yrs), hx CAD and dilated CM, A Fib, DM, DVT's, AICD device. Dx w COPD several years ago. Referred by Dr Deforest Hoyles for dyspnea, cough and congestion.  He can only walk 25 feet. He occasionally wheezes.  ROV 12/05/16 --this is a follow-up visit.  Patient is 77 with a history of severe obstructive lung disease, coronary disease, atrial fibrillation with a dilated cardiomyopathy and AICD, diabetes.  I saw him one month ago at which time we stop Spiriva and [facility] and started him on Trelegy.  He remains on Zyrtec, fluticasone nasal spray, Mucinex for allergic rhinitis. He tells me that he feels that he is benefiting from the Trelegy. He has albuterol, HFA and nebs, uses about 2x a day. He has O2 that he uses at night, not w exertion- uses prn for SOB.   ROV 06/03/17 --77 year old man with coronary disease and a dilated cardiomyopathy, atrial fibrillation, diabetes, AICD DVTs.  We followed him here for severe COPD.  He also has allergic rhinitis and cough.  Currently managed on Trelegy. He believes that it is working well.  He uses albuterol approximately 2x a day. He was recently treated in April for increased cough, mucous production, wheeze. He was treated with abx, pred. Sx resolved. He is currently back to baseline, is able to exert. He still hears some wheeze. Minimal cough or sputum. Uses o2 at night, rarely w exertion.  He is on flonase nasal spray, zyrtec.    Review of Systems  Constitutional: Negative for fever and unexpected weight change.  HENT: Negative for congestion, dental problem, ear pain, nosebleeds, postnasal drip, rhinorrhea, sinus pressure, sneezing, sore throat and trouble swallowing.   Eyes: Negative for redness and itching.  Respiratory: Positive for cough and shortness of breath. Negative for chest tightness and wheezing.        Congestion    Cardiovascular: Negative for palpitations and leg swelling.  Gastrointestinal: Negative for nausea and vomiting.  Genitourinary: Negative for dysuria.  Musculoskeletal: Negative for joint swelling.  Skin: Negative for rash.  Neurological: Negative for headaches.  Hematological: Does not bruise/bleed easily.  Psychiatric/Behavioral: Negative for dysphoric mood. The patient is not nervous/anxious.       Objective:   Physical Exam Vitals:   06/03/17 1550  BP: (!) 130/56  Pulse: 83  SpO2: 94%  Weight: 184 lb 9.6 oz (83.7 kg)  Height: 5\' 5"  (1.651 m)  Gen: Pleasant, well-nourished, in no distress,  normal affect  ENT: No lesions,  mouth clear,  oropharynx clear, disconjugate gaze, no postnasal drip  Neck: No JVD, no stridor  Lungs: normal excursion, somewhat hyperinflated, no wheeze or crackles.   Cardiovascular: RRR, heart sounds normal, no murmur or gallops, no peripheral edema  Musculoskeletal: No deformities, no cyanosis or clubbing  Neuro: alert, non focal  Skin: Warm, no lesions or rashes     Assessment & Plan:  COPD (chronic obstructive pulmonary disease) (HCC) Please continue Trelegy 1 inhalation once a day.  Remember to rinse and gargle after using this medication. Keep albuterol available to use either 1 nebulizer treatment or 2 puffs up to every 4 hours if needed for shortness of breath, wheezing, chest tightness. You would benefit from using oxygen with exertion. Follow with Dr Lamonte Sakai in 6 months or sooner if you have any problems  Allergic rhinitis Please continue your Flonase  nasal spray and Zyrtec as you are taking them.  Baltazar Apo, MD, PhD 06/03/2017, 4:19 PM [facility] [phone] or if no answer [phone]

## 2017-06-05 ENCOUNTER — Ambulatory Visit (INDEPENDENT_AMBULATORY_CARE_PROVIDER_SITE_OTHER): Payer: Medicare Other | Admitting: *Deleted

## 2017-06-05 DIAGNOSIS — I482 Chronic atrial fibrillation, unspecified: Secondary | ICD-10-CM

## 2017-06-05 DIAGNOSIS — Z5181 Encounter for therapeutic drug level monitoring: Secondary | ICD-10-CM | POA: Diagnosis not present

## 2017-06-05 LAB — POCT INR: INR: 1.7

## 2017-06-05 NOTE — Patient Instructions (Signed)
Take coumadin 3 tablets tonight and tomorrow night then resume 2 tablets daily.  Recheck INR in 3 weeks

## 2017-06-24 ENCOUNTER — Other Ambulatory Visit: Payer: Self-pay | Admitting: Emergency Medicine

## 2017-06-26 ENCOUNTER — Ambulatory Visit (INDEPENDENT_AMBULATORY_CARE_PROVIDER_SITE_OTHER): Payer: Medicare Other | Admitting: *Deleted

## 2017-06-26 DIAGNOSIS — Z5181 Encounter for therapeutic drug level monitoring: Secondary | ICD-10-CM | POA: Diagnosis not present

## 2017-06-26 DIAGNOSIS — I482 Chronic atrial fibrillation, unspecified: Secondary | ICD-10-CM

## 2017-06-26 LAB — POCT INR: INR: 2.6 (ref 2.0–3.0)

## 2017-06-26 NOTE — Patient Instructions (Signed)
Continue coumadin 2 tablets daily.  Recheck INR in 4 weeks 

## 2017-07-01 DIAGNOSIS — E782 Mixed hyperlipidemia: Secondary | ICD-10-CM | POA: Diagnosis not present

## 2017-07-01 DIAGNOSIS — J440 Chronic obstructive pulmonary disease with acute lower respiratory infection: Secondary | ICD-10-CM | POA: Diagnosis not present

## 2017-07-01 DIAGNOSIS — I5032 Chronic diastolic (congestive) heart failure: Secondary | ICD-10-CM | POA: Diagnosis not present

## 2017-07-01 DIAGNOSIS — M10232 Drug-induced gout, left wrist: Secondary | ICD-10-CM | POA: Diagnosis not present

## 2017-07-01 DIAGNOSIS — I10 Essential (primary) hypertension: Secondary | ICD-10-CM | POA: Diagnosis not present

## 2017-07-08 ENCOUNTER — Other Ambulatory Visit: Payer: Self-pay | Admitting: Emergency Medicine

## 2017-07-20 DIAGNOSIS — J441 Chronic obstructive pulmonary disease with (acute) exacerbation: Secondary | ICD-10-CM | POA: Diagnosis not present

## 2017-07-22 ENCOUNTER — Ambulatory Visit (INDEPENDENT_AMBULATORY_CARE_PROVIDER_SITE_OTHER): Payer: Medicare Other | Admitting: *Deleted

## 2017-07-22 DIAGNOSIS — Z5181 Encounter for therapeutic drug level monitoring: Secondary | ICD-10-CM

## 2017-07-22 DIAGNOSIS — I482 Chronic atrial fibrillation, unspecified: Secondary | ICD-10-CM

## 2017-07-22 LAB — POCT INR: INR: 2.4 (ref 2.0–3.0)

## 2017-07-22 NOTE — Patient Instructions (Signed)
Continue coumadin 2 tablets daily.  Recheck INR in 4 weeks

## 2017-07-31 DIAGNOSIS — H34232 Retinal artery branch occlusion, left eye: Secondary | ICD-10-CM | POA: Diagnosis not present

## 2017-07-31 DIAGNOSIS — H353131 Nonexudative age-related macular degeneration, bilateral, early dry stage: Secondary | ICD-10-CM | POA: Diagnosis not present

## 2017-07-31 DIAGNOSIS — H43813 Vitreous degeneration, bilateral: Secondary | ICD-10-CM | POA: Diagnosis not present

## 2017-07-31 DIAGNOSIS — H35033 Hypertensive retinopathy, bilateral: Secondary | ICD-10-CM | POA: Diagnosis not present

## 2017-08-11 DIAGNOSIS — I452 Bifascicular block: Secondary | ICD-10-CM | POA: Diagnosis not present

## 2017-08-11 DIAGNOSIS — J440 Chronic obstructive pulmonary disease with acute lower respiratory infection: Secondary | ICD-10-CM | POA: Diagnosis not present

## 2017-08-11 DIAGNOSIS — I5043 Acute on chronic combined systolic (congestive) and diastolic (congestive) heart failure: Secondary | ICD-10-CM | POA: Diagnosis not present

## 2017-08-11 DIAGNOSIS — Z955 Presence of coronary angioplasty implant and graft: Secondary | ICD-10-CM | POA: Diagnosis not present

## 2017-08-11 DIAGNOSIS — Z7901 Long term (current) use of anticoagulants: Secondary | ICD-10-CM | POA: Diagnosis not present

## 2017-08-11 DIAGNOSIS — I351 Nonrheumatic aortic (valve) insufficiency: Secondary | ICD-10-CM | POA: Diagnosis not present

## 2017-08-11 DIAGNOSIS — I10 Essential (primary) hypertension: Secondary | ICD-10-CM | POA: Diagnosis not present

## 2017-08-11 DIAGNOSIS — E1122 Type 2 diabetes mellitus with diabetic chronic kidney disease: Secondary | ICD-10-CM | POA: Diagnosis not present

## 2017-08-11 DIAGNOSIS — R079 Chest pain, unspecified: Secondary | ICD-10-CM | POA: Diagnosis not present

## 2017-08-11 DIAGNOSIS — J181 Lobar pneumonia, unspecified organism: Secondary | ICD-10-CM | POA: Diagnosis not present

## 2017-08-11 DIAGNOSIS — I2510 Atherosclerotic heart disease of native coronary artery without angina pectoris: Secondary | ICD-10-CM | POA: Diagnosis not present

## 2017-08-11 DIAGNOSIS — N181 Chronic kidney disease, stage 1: Secondary | ICD-10-CM | POA: Diagnosis not present

## 2017-08-11 DIAGNOSIS — N390 Urinary tract infection, site not specified: Secondary | ICD-10-CM | POA: Diagnosis not present

## 2017-08-11 DIAGNOSIS — Z87891 Personal history of nicotine dependence: Secondary | ICD-10-CM | POA: Diagnosis not present

## 2017-08-11 DIAGNOSIS — R0602 Shortness of breath: Secondary | ICD-10-CM | POA: Diagnosis not present

## 2017-08-11 DIAGNOSIS — B9620 Unspecified Escherichia coli [E. coli] as the cause of diseases classified elsewhere: Secondary | ICD-10-CM | POA: Diagnosis not present

## 2017-08-11 DIAGNOSIS — J168 Pneumonia due to other specified infectious organisms: Secondary | ICD-10-CM | POA: Diagnosis not present

## 2017-08-11 DIAGNOSIS — Z86711 Personal history of pulmonary embolism: Secondary | ICD-10-CM | POA: Diagnosis not present

## 2017-08-11 DIAGNOSIS — I255 Ischemic cardiomyopathy: Secondary | ICD-10-CM | POA: Diagnosis not present

## 2017-08-11 DIAGNOSIS — I252 Old myocardial infarction: Secondary | ICD-10-CM | POA: Diagnosis not present

## 2017-08-11 DIAGNOSIS — J441 Chronic obstructive pulmonary disease with (acute) exacerbation: Secondary | ICD-10-CM | POA: Diagnosis not present

## 2017-08-11 DIAGNOSIS — Z95810 Presence of automatic (implantable) cardiac defibrillator: Secondary | ICD-10-CM | POA: Diagnosis not present

## 2017-08-11 DIAGNOSIS — N183 Chronic kidney disease, stage 3 (moderate): Secondary | ICD-10-CM | POA: Diagnosis not present

## 2017-08-11 DIAGNOSIS — M109 Gout, unspecified: Secondary | ICD-10-CM | POA: Diagnosis not present

## 2017-08-11 DIAGNOSIS — I130 Hypertensive heart and chronic kidney disease with heart failure and stage 1 through stage 4 chronic kidney disease, or unspecified chronic kidney disease: Secondary | ICD-10-CM | POA: Diagnosis not present

## 2017-08-11 DIAGNOSIS — E7800 Pure hypercholesterolemia, unspecified: Secondary | ICD-10-CM | POA: Diagnosis not present

## 2017-08-11 DIAGNOSIS — J189 Pneumonia, unspecified organism: Secondary | ICD-10-CM | POA: Diagnosis not present

## 2017-08-14 ENCOUNTER — Other Ambulatory Visit: Payer: Self-pay | Admitting: Interventional Cardiology

## 2017-08-14 DIAGNOSIS — I6523 Occlusion and stenosis of bilateral carotid arteries: Secondary | ICD-10-CM

## 2017-08-19 ENCOUNTER — Ambulatory Visit (INDEPENDENT_AMBULATORY_CARE_PROVIDER_SITE_OTHER): Payer: Medicare Other | Admitting: *Deleted

## 2017-08-19 DIAGNOSIS — I482 Chronic atrial fibrillation, unspecified: Secondary | ICD-10-CM

## 2017-08-19 DIAGNOSIS — Z5181 Encounter for therapeutic drug level monitoring: Secondary | ICD-10-CM | POA: Diagnosis not present

## 2017-08-19 LAB — POCT INR: INR: 2.4 (ref 2.0–3.0)

## 2017-08-19 NOTE — Patient Instructions (Signed)
Continue coumadin 2 tablets daily.  Recheck INR in 4 weeks

## 2017-08-20 ENCOUNTER — Ambulatory Visit (INDEPENDENT_AMBULATORY_CARE_PROVIDER_SITE_OTHER): Payer: Medicare Other | Admitting: *Deleted

## 2017-08-20 DIAGNOSIS — Z95810 Presence of automatic (implantable) cardiac defibrillator: Secondary | ICD-10-CM | POA: Diagnosis not present

## 2017-08-20 DIAGNOSIS — I5022 Chronic systolic (congestive) heart failure: Secondary | ICD-10-CM | POA: Diagnosis not present

## 2017-08-20 DIAGNOSIS — J441 Chronic obstructive pulmonary disease with (acute) exacerbation: Secondary | ICD-10-CM | POA: Diagnosis not present

## 2017-08-20 DIAGNOSIS — I428 Other cardiomyopathies: Secondary | ICD-10-CM | POA: Diagnosis not present

## 2017-08-20 DIAGNOSIS — I429 Cardiomyopathy, unspecified: Secondary | ICD-10-CM

## 2017-08-20 LAB — CUP PACEART INCLINIC DEVICE CHECK
Brady Statistic RA Percent Paced: 60 %
HighPow Impedance: 44.8003
Implantable Lead Implant Date: 20120801
Implantable Lead Implant Date: 20120801
Implantable Lead Location: 753859
Implantable Pulse Generator Implant Date: 20120801
Lead Channel Impedance Value: 837.5 Ohm
Lead Channel Pacing Threshold Amplitude: 0.625 V
Lead Channel Pacing Threshold Amplitude: 0.875 V
Lead Channel Pacing Threshold Pulse Width: 0.5 ms
Lead Channel Pacing Threshold Pulse Width: 0.5 ms
Lead Channel Setting Pacing Amplitude: 2 V
Lead Channel Setting Pacing Amplitude: 2 V
Lead Channel Setting Pacing Pulse Width: 0.5 ms
Lead Channel Setting Pacing Pulse Width: 0.5 ms
MDC IDC LEAD IMPLANT DT: 20120801
MDC IDC LEAD LOCATION: 753858
MDC IDC LEAD LOCATION: 753860
MDC IDC MSMT BATTERY REMAINING LONGEVITY: 19 mo
MDC IDC MSMT LEADCHNL LV PACING THRESHOLD AMPLITUDE: 1 V
MDC IDC MSMT LEADCHNL RA IMPEDANCE VALUE: 412.5 Ohm
MDC IDC MSMT LEADCHNL RA PACING THRESHOLD PULSEWIDTH: 0.5 ms
MDC IDC MSMT LEADCHNL RA SENSING INTR AMPL: 5 mV
MDC IDC MSMT LEADCHNL RV IMPEDANCE VALUE: 550 Ohm
MDC IDC SESS DTM: 20190731171108
MDC IDC SET LEADCHNL RA PACING AMPLITUDE: 1.875
MDC IDC SET LEADCHNL RV SENSING SENSITIVITY: 0.5 mV
Pulse Gen Serial Number: 7001284

## 2017-08-20 NOTE — Progress Notes (Signed)
CRT-D device check in office. Thresholds and sensing consistent with previous device measurements. Lead impedance trends stable over time. 13% AT/AF burden, +warfarin. No ventricular arrhythmia episodes recorded. Patient bi-ventricularly pacing 91% of the time. Device programmed with appropriate safety margins. Heart failure diagnostics reviewed, CorVue recently unstable x11 days but returning to baseline at present. Vibratory alerts demonstrated for patient. No changes made this session. Estimated longevity 1.5 years. Patient declines Merlin monitoring. Patient education completed including shock plan. ROV with DC/R on 11/26/17, ROV with GT/R in 01/2018.

## 2017-08-27 DIAGNOSIS — J150 Pneumonia due to Klebsiella pneumoniae: Secondary | ICD-10-CM | POA: Diagnosis not present

## 2017-08-27 DIAGNOSIS — I10 Essential (primary) hypertension: Secondary | ICD-10-CM | POA: Diagnosis not present

## 2017-08-27 DIAGNOSIS — J440 Chronic obstructive pulmonary disease with acute lower respiratory infection: Secondary | ICD-10-CM | POA: Diagnosis not present

## 2017-09-08 IMAGING — CR DG CHEST 2V
2 series · 2 of 2 positions shown · non-contrast
Comparison: Chest x-rays dated 12/06/2015, 03/04/2015 in [DATE]
5644.

CLINICAL DATA: SOB and cough with phlegm x 1 week COPD, bronchitis,
a-fib, CAD, GERD, DM, HTN, CHF, ischemic dilated cardiomyopathy,
former smoker x 25 years ago Sx: pacemaker, coronary stent
placement, cardiac defib placement

EXAM:
CHEST  2 VIEW

[w chest pa]
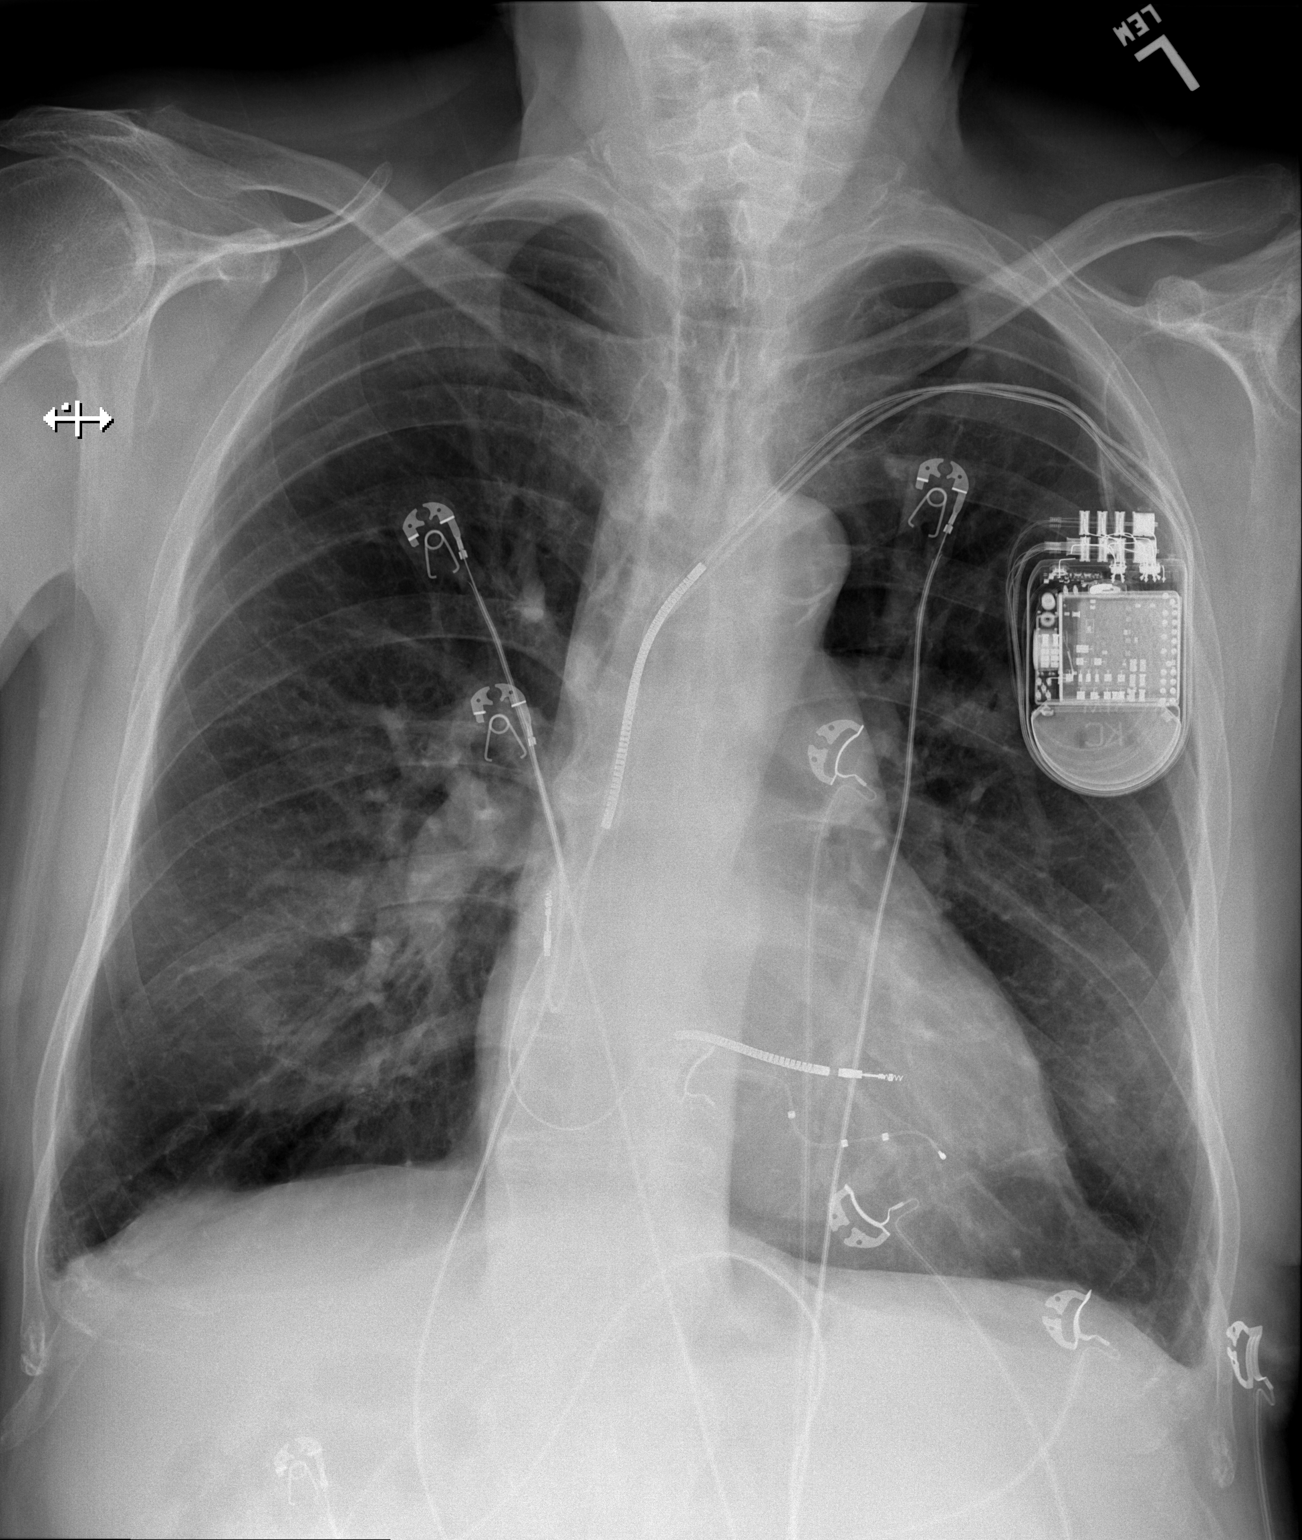

[w chest lat]
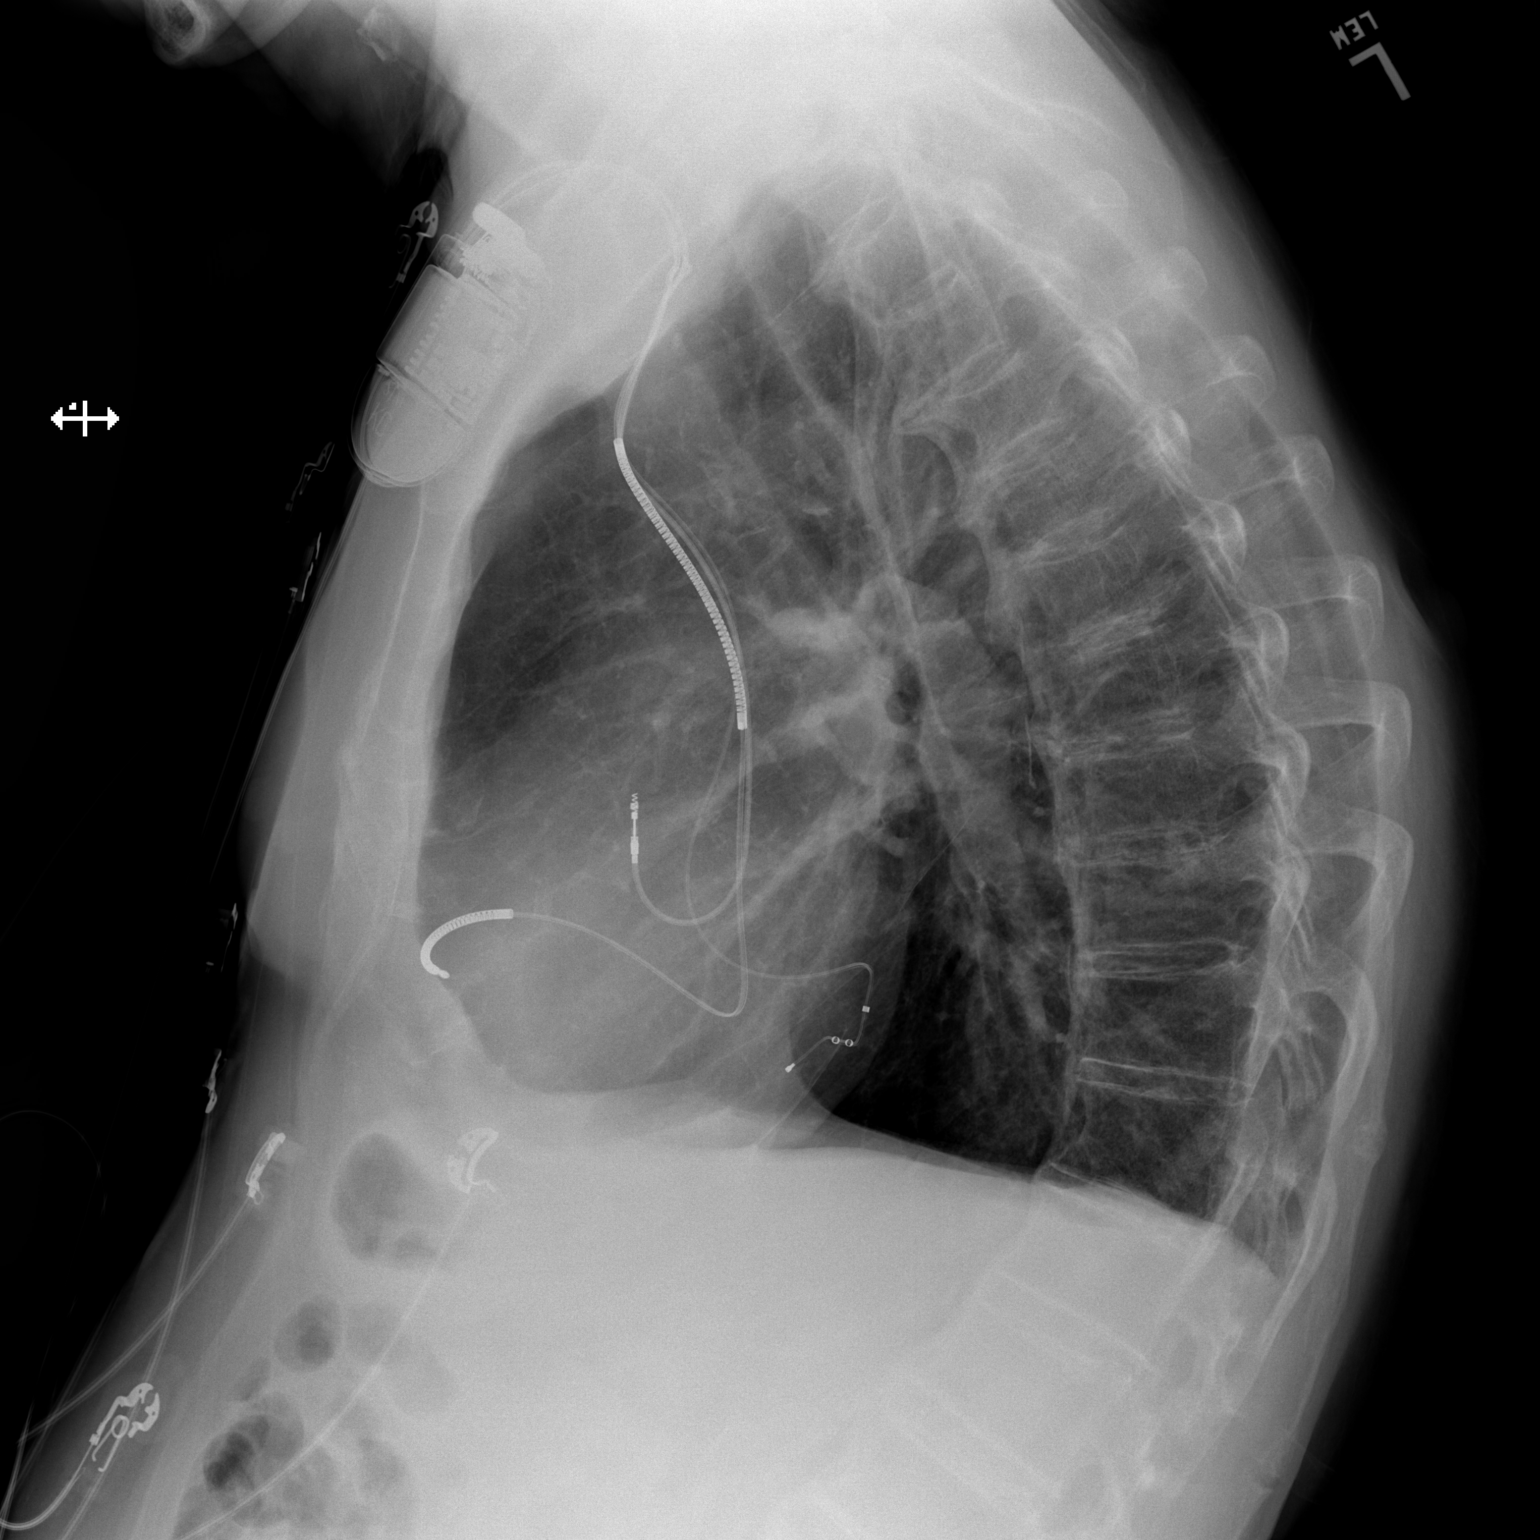

[2 of 2 positions shown; findings below may reference images not displayed]

FINDINGS: Heart size is upper normal, stable. Overall cardiomediastinal
silhouette appears stable. Atherosclerotic changes again noted at
the aortic arch. Left chest wall pacemaker/ICD in place without
evidence of wire discontinuity or malposition.

Lungs are clear. No pleural effusion or pneumothorax seen. Lungs are
hyperexpanded. Degenerative spurring and ankylosis again noted
throughout the kyphotic thoracic spine. No acute or suspicious
osseous finding.
IMPRESSION: 1. No active cardiopulmonary disease. No evidence of pneumonia or
pulmonary edema.
2. COPD/emphysema.
3. Aortic atherosclerosis.

## 2017-09-11 ENCOUNTER — Ambulatory Visit: Payer: Medicare Other | Admitting: Interventional Cardiology

## 2017-09-16 ENCOUNTER — Ambulatory Visit (INDEPENDENT_AMBULATORY_CARE_PROVIDER_SITE_OTHER): Payer: Medicare Other | Admitting: *Deleted

## 2017-09-16 DIAGNOSIS — Z5181 Encounter for therapeutic drug level monitoring: Secondary | ICD-10-CM

## 2017-09-16 DIAGNOSIS — I482 Chronic atrial fibrillation, unspecified: Secondary | ICD-10-CM

## 2017-09-16 LAB — POCT INR: INR: 2.4 (ref 2.0–3.0)

## 2017-09-16 NOTE — Patient Instructions (Signed)
Continue coumadin 2 tablets daily.  Recheck INR in 6 weeks

## 2017-09-20 DIAGNOSIS — J441 Chronic obstructive pulmonary disease with (acute) exacerbation: Secondary | ICD-10-CM | POA: Diagnosis not present

## 2017-10-01 DIAGNOSIS — J449 Chronic obstructive pulmonary disease, unspecified: Secondary | ICD-10-CM | POA: Diagnosis not present

## 2017-10-01 DIAGNOSIS — I10 Essential (primary) hypertension: Secondary | ICD-10-CM | POA: Diagnosis not present

## 2017-10-20 DIAGNOSIS — J441 Chronic obstructive pulmonary disease with (acute) exacerbation: Secondary | ICD-10-CM | POA: Diagnosis not present

## 2017-10-28 ENCOUNTER — Ambulatory Visit (INDEPENDENT_AMBULATORY_CARE_PROVIDER_SITE_OTHER): Payer: Medicare Other | Admitting: *Deleted

## 2017-10-28 DIAGNOSIS — Z5181 Encounter for therapeutic drug level monitoring: Secondary | ICD-10-CM | POA: Diagnosis not present

## 2017-10-28 DIAGNOSIS — I482 Chronic atrial fibrillation, unspecified: Secondary | ICD-10-CM | POA: Diagnosis not present

## 2017-10-28 LAB — POCT INR: INR: 3 (ref 2.0–3.0)

## 2017-10-28 NOTE — Patient Instructions (Signed)
Continue coumadin 2 tablets daily.  Recheck INR in 6 weeks

## 2017-11-04 ENCOUNTER — Other Ambulatory Visit: Payer: Self-pay | Admitting: Interventional Cardiology

## 2017-11-04 NOTE — Telephone Encounter (Signed)
This is a [place] pt °

## 2017-11-07 ENCOUNTER — Encounter

## 2017-11-14 ENCOUNTER — Encounter: Payer: Self-pay | Admitting: Interventional Cardiology

## 2017-11-14 ENCOUNTER — Ambulatory Visit (INDEPENDENT_AMBULATORY_CARE_PROVIDER_SITE_OTHER): Payer: Medicare Other | Admitting: Interventional Cardiology

## 2017-11-14 VITALS — BP 142/64 | HR 67 | Ht 65.0 in | Wt 188.0 lb

## 2017-11-14 DIAGNOSIS — I5022 Chronic systolic (congestive) heart failure: Secondary | ICD-10-CM

## 2017-11-14 DIAGNOSIS — I480 Paroxysmal atrial fibrillation: Secondary | ICD-10-CM

## 2017-11-14 DIAGNOSIS — N183 Chronic kidney disease, stage 3 unspecified: Secondary | ICD-10-CM

## 2017-11-14 DIAGNOSIS — I25119 Atherosclerotic heart disease of native coronary artery with unspecified angina pectoris: Secondary | ICD-10-CM

## 2017-11-14 NOTE — Patient Instructions (Signed)
Medication Instructions:  Your physician recommends that you continue on your current medications as directed. Please refer to the Current Medication list given to you today.  If you need a refill on your cardiac medications before your next appointment, please call your pharmacy.   Lab work: None Ordered  If you have labs (blood work) drawn today and your tests are completely normal, you will receive your results only by: Marland Kitchen MyChart Message (if you have MyChart) OR . A paper copy in the mail If you have any lab test that is abnormal or we need to change your treatment, we will call you to review the results.  Testing/Procedures: None ordered  Follow-Up: Keep appointment with Dr. Lovena Le in [place] on 03/10/18 9:15 AM.  At [facility], you and your health needs are our priority.  As part of our continuing mission to provide you with exceptional heart care, we have created designated Provider Care Teams.  These Care Teams include your primary Cardiologist (physician) and Advanced Practice Providers (APPs -  Physician Assistants and Nurse Practitioners) who all work together to provide you with the care you need, when you need it. . You will need a follow up appointment in June 2020.  Please call our office 2 months in advance to schedule this appointment.  You may see Casandra Doffing, MD or one of the following Advanced Practice Providers on your designated Care Team:   . Lyda Jester, PA-C . Dayna Dunn, PA-C . Ermalinda Barrios, PA-C  Any Other Special Instructions Will Be Listed Below (If Applicable).

## 2017-11-14 NOTE — Progress Notes (Signed)
Cardiology Office Note   Date:  11/14/2017   ID:  Troy [name], DOB May 10, 1940, MRN [id number]  PCP:  Neale Burly, MD    No chief complaint on file.  Chronic systolic heart failure  Wt Readings from Last 3 Encounters:  11/14/17 188 lb (85.3 kg)  06/03/17 184 lb 9.6 oz (83.7 kg)  03/17/17 180 lb (81.6 kg)       History of Present Illness: Troy [name] is a 77 y.o. male  who presents for chronic systolic HF, COPD.   He has a mixed CM, chronic class 2 CHF, s/p biv-ICD-PLACED 2012, chb, and PAF. CAD, PCI of RCA in 1996 .   Echo 11/22/13 with EF 20-25%.   Hospitalized in12/16 for acute on chronic systolic HF. Was neg 5 L with diuresis- discharged with oxygen and to SNF for rehab.   Heart failure managed medically. Digoxin level of 1.8 was associated with nausea.   Hospitalized in 8/17 for presumed Lower GI bleed form diverticulosis. No colonoscopy was done.  ECHO in 2018 for branch retinal occlusion showed EF 35-40%.  He is using [place] oxygen at night.   He was admitted in Jan 2019 with acute on chronic hypoxic resp failure.  Hospital records show: "2D echo with a EF of 25-30%, global hypokinesis with akinesis of the inferior lateral wall. Overall severely reduced LV function. Grade 1 diastolic dysfunction. Due to decreased ejection fraction and wall motion abnormalities cardiology was consulted. Patientunderwent Lexiscan Myoview on 02/03/2017 which showed no inducible ischemia, evidence of scar in the inferior distribution. Cardiology recommended patient be transitioned to home regimen of oral diuretics with salt and fluid restriction."    Denies : Chest pain. Dizziness. Leg edema. Nitroglycerin use. Orthopnea. Palpitations. Paroxysmal nocturnal dyspnea. Syncope.   He stays active.  No falls. No volume overload issues recently.  He has chronic shortness of breath.   Past Medical History:  Diagnosis Date  . Atrial fibrillation ([place])   .  CAD (coronary artery disease) 1996   status post PCI of the RCA   . Community acquired pneumonia 08/24/2015  . Congestive heart failure, unspecified   . COPD (chronic obstructive pulmonary disease) (HCC)    Pt on home O2 at night and PRN, unsure of date of diagnosis.  . Diabetes mellitus, type 2 ([place])   . DJD (degenerative joint disease)   . GERD (gastroesophageal reflux disease)   . Gout   . HTN (hypertension)   . Ischemic dilated cardiomyopathy ([place])   . Nephrolithiasis   . Other primary cardiomyopathies   . Personal history of DVT (deep vein thrombosis)   . Prostatitis   . Shingles     Past Surgical History:  Procedure Laterality Date  . BACK SURGERY    . CARDIAC DEFIBRILLATOR PLACEMENT    . CORONARY STENT PLACEMENT    . PACEMAKER INSERTION       Current Outpatient Medications  Medication Sig Dispense Refill  . acetaminophen (TYLENOL) 500 MG tablet Take 500 mg by mouth every 6 (six) hours as needed for mild pain or moderate pain.    Marland Kitchen albuterol (ACCUNEB) 1.25 MG/3ML nebulizer solution INHALE THE CONTENTS OF ONE VIAL INTO THE LUNGS EVERY 6 HOURS AS NEEDED 300 mL 5  . albuterol (PROAIR HFA) 108 (90 Base) MCG/ACT inhaler Inhale 1-2 puffs into the lungs every 6 (six) hours as needed for wheezing or shortness of breath. 8.5 g 1  . alfuzosin (UROXATRAL) 10 MG 24 hr tablet Take 10  mg by mouth daily with breakfast.     . allopurinol (ZYLOPRIM) 100 MG tablet Take 1 tablet (100 mg total) daily by mouth. 90 tablet 0  . atorvastatin (LIPITOR) 10 MG tablet TAKE ONE TABLET BY MOUTH AT BEDTIME 90 tablet 1  . carvedilol (COREG) 12.5 MG tablet Take 1 tablet (12.5 mg total) by mouth 2 (two) times daily with a meal. 60 tablet 6  . cetirizine (ZYRTEC) 10 MG tablet Take 10 mg by mouth daily.    . digoxin (LANOXIN) 0.125 MG tablet Take 0.5 tablets (0.0625 mg total) daily by mouth. 45 tablet 0  . fluticasone (FLONASE) 50 MCG/ACT nasal spray Place 2 sprays into both nostrils at bedtime as needed for  allergies. 16 g 3  . furosemide (LASIX) 40 MG tablet Take 1.5 tablets (60 mg total) by mouth 2 (two) times daily. 90 tablet 11  . TRELEGY ELLIPTA 100-62.5-25 MCG/INH AEPB INHALE 1 PUFF BY MOUTH DAILY 60 each 5  . warfarin (COUMADIN) 1 MG tablet Take 2 tablets daily or as directed 70 tablet 3   No current facility-administered medications for this visit.     Allergies:   Benadryl [diphenhydramine hcl] and Diphenhydramine    Social History:  The patient  reports that he quit smoking about 37 years ago. His smoking use included cigarettes. He has a 200.00 pack-year smoking history. He has never used smokeless tobacco. He reports that he does not drink alcohol or use drugs.   Family History:  The patient's family history includes Cancer in his father; Colon cancer in his mother; Heart disease in his father; Stroke in his paternal uncle.    ROS:  Please see the history of present illness.   Otherwise, review of systems are positive for chronic DOE.   All other systems are reviewed and negative.    PHYSICAL EXAM: VS:  BP (!) 142/64   Pulse 67   Ht 5\' 5"  (1.651 m)   Wt 188 lb (85.3 kg)   SpO2 95%   BMI 31.28 kg/m  , BMI Body mass index is 31.28 kg/m. GEN: Well nourished, well developed, in no acute distress  HEENT: normal  Neck: no JVD, carotid bruits, or masses Cardiac: RRR; no murmurs, rubs, or gallops, tr LE edema  Respiratory:  clear to auscultation bilaterally, normal work of breathing GI: soft, nontender, nondistended, + BS MS: no deformity or atrophy  Skin: warm and dry, no rash Neuro:  Strength and sensation are intact Psych: euthymic mood, full affect   EKG:   The ekg ordered in Jan 2019 demonstrates NSR, no ST segment changes   Recent Labs: 01/31/2017: B Natriuretic Peptide 449.7 02/03/2017: BUN 17; Creatinine, Ser 1.36; Hemoglobin 12.4; Platelets 145; Potassium 3.8; Sodium 137   Lipid Panel    Component Value Date/Time   CHOL 135 09/19/2016 1454   TRIG 168 (H)  09/19/2016 1454   HDL 42 09/19/2016 1454   CHOLHDL 3.2 09/19/2016 1454   VLDL 34 (H) 09/19/2016 1454   LDLCALC 59 09/19/2016 1454     Other studies Reviewed: Additional studies/ records that were reviewed today with results demonstrating: LDL 59 in 8/18; TC 131 in 6/19.   ASSESSMENT AND PLAN:  1. Chronic systolic heart failure: He appears euvolemic.  COntinue current meds. 2. CAD: No angina.  Continue aggressive secondary prevention.   3. CRI: Cr 1.2 4. AFib: Warfarin for stroke prevention.  Monitored regularly. 5. Seeing Dr. Lovena Le in Jan 2020.   Current medicines are reviewed at length  with the patient today.  The patient concerns regarding his medicines were addressed.  The following changes have been made:  No change  Labs/ tests ordered today include:  No orders of the defined types were placed in this encounter.   Recommend 150 minutes/week of aerobic exercise Low fat, low carb, high fiber diet recommended  Disposition:   FU in 8 months.   Signed, Larae Grooms, MD  11/14/2017 3:12 PM    [facility] [address], [place], [place]  [address] Phone: [phone]; Fax: [phone]

## 2017-11-16 ENCOUNTER — Other Ambulatory Visit: Payer: Self-pay | Admitting: Cardiology

## 2017-11-16 MED ORDER — FUROSEMIDE 40 MG PO TABS: 60.0000 mg | ORAL_TABLET | Freq: Two times a day (BID) | ORAL | 11 refills | Status: DC

## 2017-11-20 DIAGNOSIS — J441 Chronic obstructive pulmonary disease with (acute) exacerbation: Secondary | ICD-10-CM | POA: Diagnosis not present

## 2017-11-26 ENCOUNTER — Telehealth: Payer: Self-pay | Admitting: *Deleted

## 2017-11-26 ENCOUNTER — Ambulatory Visit (INDEPENDENT_AMBULATORY_CARE_PROVIDER_SITE_OTHER): Payer: Medicare Other | Admitting: *Deleted

## 2017-11-26 DIAGNOSIS — I5022 Chronic systolic (congestive) heart failure: Secondary | ICD-10-CM | POA: Diagnosis not present

## 2017-11-26 DIAGNOSIS — I429 Cardiomyopathy, unspecified: Secondary | ICD-10-CM

## 2017-11-26 DIAGNOSIS — I428 Other cardiomyopathies: Secondary | ICD-10-CM

## 2017-11-26 DIAGNOSIS — Z95810 Presence of automatic (implantable) cardiac defibrillator: Secondary | ICD-10-CM | POA: Diagnosis not present

## 2017-11-26 LAB — CUP PACEART INCLINIC DEVICE CHECK
Battery Remaining Longevity: 15 mo
Brady Statistic RA Percent Paced: 69 %
Date Time Interrogation Session: 20191106121639
HIGH POWER IMPEDANCE MEASURED VALUE: 46 Ohm
Implantable Lead Implant Date: 20120801
Implantable Lead Location: 753858
Implantable Lead Location: 753860
Lead Channel Pacing Threshold Amplitude: 0.75 V
Lead Channel Pacing Threshold Amplitude: 0.875 V
Lead Channel Pacing Threshold Pulse Width: 0.5 ms
Lead Channel Sensing Intrinsic Amplitude: 4 mV
Lead Channel Setting Pacing Amplitude: 2 V
Lead Channel Setting Pacing Amplitude: 2 V
Lead Channel Setting Pacing Pulse Width: 0.5 ms
MDC IDC LEAD IMPLANT DT: 20120801
MDC IDC LEAD IMPLANT DT: 20120801
MDC IDC LEAD LOCATION: 753859
MDC IDC MSMT LEADCHNL LV IMPEDANCE VALUE: 862.5 Ohm
MDC IDC MSMT LEADCHNL RA IMPEDANCE VALUE: 437.5 Ohm
MDC IDC MSMT LEADCHNL RA PACING THRESHOLD PULSEWIDTH: 0.5 ms
MDC IDC MSMT LEADCHNL RV IMPEDANCE VALUE: 512.5 Ohm
MDC IDC MSMT LEADCHNL RV PACING THRESHOLD AMPLITUDE: 0.625 V
MDC IDC MSMT LEADCHNL RV PACING THRESHOLD PULSEWIDTH: 0.5 ms
MDC IDC PG IMPLANT DT: 20120801
MDC IDC PG SERIAL: 7001284
MDC IDC SET LEADCHNL LV PACING PULSEWIDTH: 0.5 ms
MDC IDC SET LEADCHNL RA PACING AMPLITUDE: 1.75 V
MDC IDC SET LEADCHNL RV SENSING SENSITIVITY: 0.5 mV

## 2017-11-26 NOTE — Telephone Encounter (Signed)
LMOVM for patient offering to see him earlier if he can come to the [facility] office before 1pm. Advised he doesn't need to call back if he does want to be seen earlier--he can just show up.

## 2017-11-26 NOTE — Progress Notes (Signed)
CRT-D device check in office. Thresholds and sensing consistent with previous device measurements. Lead impedance trends stable over time. 11% AT/AF burden, +warfarin. No ventricular arrhythmia episodes recorded. Patient bi-ventricularly pacing 95% of the time. Device programmed with appropriate safety margins. Heart failure diagnostics reviewed and trends are stable for patient. Patient aware to call if he feels vibratory alert. No changes made this session. Estimated longevity 1.3 years. Patient declines remote monitoring. Patient education completed including shock plan. ROV with GT/R on 03/10/18.

## 2017-11-29 DIAGNOSIS — E1122 Type 2 diabetes mellitus with diabetic chronic kidney disease: Secondary | ICD-10-CM | POA: Diagnosis not present

## 2017-11-29 DIAGNOSIS — Z955 Presence of coronary angioplasty implant and graft: Secondary | ICD-10-CM | POA: Diagnosis not present

## 2017-11-29 DIAGNOSIS — K5730 Diverticulosis of large intestine without perforation or abscess without bleeding: Secondary | ICD-10-CM | POA: Diagnosis not present

## 2017-11-29 DIAGNOSIS — R112 Nausea with vomiting, unspecified: Secondary | ICD-10-CM | POA: Diagnosis not present

## 2017-11-29 DIAGNOSIS — K5792 Diverticulitis of intestine, part unspecified, without perforation or abscess without bleeding: Secondary | ICD-10-CM | POA: Diagnosis not present

## 2017-11-29 DIAGNOSIS — R110 Nausea: Secondary | ICD-10-CM | POA: Diagnosis not present

## 2017-11-29 DIAGNOSIS — I10 Essential (primary) hypertension: Secondary | ICD-10-CM | POA: Diagnosis not present

## 2017-11-29 DIAGNOSIS — K5732 Diverticulitis of large intestine without perforation or abscess without bleeding: Secondary | ICD-10-CM | POA: Diagnosis not present

## 2017-11-29 DIAGNOSIS — B9620 Unspecified Escherichia coli [E. coli] as the cause of diseases classified elsewhere: Secondary | ICD-10-CM | POA: Diagnosis not present

## 2017-11-29 DIAGNOSIS — Z87891 Personal history of nicotine dependence: Secondary | ICD-10-CM | POA: Diagnosis not present

## 2017-11-29 DIAGNOSIS — J449 Chronic obstructive pulmonary disease, unspecified: Secondary | ICD-10-CM | POA: Diagnosis not present

## 2017-11-29 DIAGNOSIS — Z95810 Presence of automatic (implantable) cardiac defibrillator: Secondary | ICD-10-CM | POA: Diagnosis not present

## 2017-11-29 DIAGNOSIS — R197 Diarrhea, unspecified: Secondary | ICD-10-CM | POA: Diagnosis not present

## 2017-11-29 DIAGNOSIS — I255 Ischemic cardiomyopathy: Secondary | ICD-10-CM | POA: Diagnosis not present

## 2017-11-29 DIAGNOSIS — E7800 Pure hypercholesterolemia, unspecified: Secondary | ICD-10-CM | POA: Diagnosis not present

## 2017-11-29 DIAGNOSIS — R1111 Vomiting without nausea: Secondary | ICD-10-CM | POA: Diagnosis not present

## 2017-11-29 DIAGNOSIS — I252 Old myocardial infarction: Secondary | ICD-10-CM | POA: Diagnosis not present

## 2017-11-29 DIAGNOSIS — I2510 Atherosclerotic heart disease of native coronary artery without angina pectoris: Secondary | ICD-10-CM | POA: Diagnosis not present

## 2017-11-29 DIAGNOSIS — I482 Chronic atrial fibrillation, unspecified: Secondary | ICD-10-CM | POA: Diagnosis not present

## 2017-11-29 DIAGNOSIS — Z86711 Personal history of pulmonary embolism: Secondary | ICD-10-CM | POA: Diagnosis not present

## 2017-11-29 DIAGNOSIS — I5023 Acute on chronic systolic (congestive) heart failure: Secondary | ICD-10-CM | POA: Diagnosis not present

## 2017-11-29 DIAGNOSIS — R7881 Bacteremia: Secondary | ICD-10-CM | POA: Diagnosis not present

## 2017-11-29 DIAGNOSIS — Z7901 Long term (current) use of anticoagulants: Secondary | ICD-10-CM | POA: Diagnosis not present

## 2017-11-29 DIAGNOSIS — N189 Chronic kidney disease, unspecified: Secondary | ICD-10-CM | POA: Diagnosis not present

## 2017-11-29 DIAGNOSIS — M109 Gout, unspecified: Secondary | ICD-10-CM | POA: Diagnosis not present

## 2017-11-29 DIAGNOSIS — I129 Hypertensive chronic kidney disease with stage 1 through stage 4 chronic kidney disease, or unspecified chronic kidney disease: Secondary | ICD-10-CM | POA: Diagnosis not present

## 2017-12-03 ENCOUNTER — Ambulatory Visit: Payer: Medicare Other | Admitting: Emergency Medicine

## 2017-12-09 ENCOUNTER — Ambulatory Visit (INDEPENDENT_AMBULATORY_CARE_PROVIDER_SITE_OTHER): Payer: Medicare Other | Admitting: *Deleted

## 2017-12-09 DIAGNOSIS — Z5181 Encounter for therapeutic drug level monitoring: Secondary | ICD-10-CM

## 2017-12-09 DIAGNOSIS — I482 Chronic atrial fibrillation, unspecified: Secondary | ICD-10-CM

## 2017-12-09 LAB — POCT INR: INR: 1.5 — AB (ref 2.0–3.0)

## 2017-12-09 NOTE — Patient Instructions (Signed)
Take coumadin 3 tablets x 3 days then resume 2 tablets daily.  Recheck INR in 3 weeks

## 2017-12-10 DIAGNOSIS — Z7689 Persons encountering health services in other specified circumstances: Secondary | ICD-10-CM | POA: Diagnosis not present

## 2017-12-10 DIAGNOSIS — Z1389 Encounter for screening for other disorder: Secondary | ICD-10-CM | POA: Diagnosis not present

## 2017-12-10 DIAGNOSIS — K5752 Diverticulitis of both small and large intestine without perforation or abscess without bleeding: Secondary | ICD-10-CM | POA: Diagnosis not present

## 2017-12-10 DIAGNOSIS — E119 Type 2 diabetes mellitus without complications: Secondary | ICD-10-CM | POA: Diagnosis not present

## 2017-12-10 DIAGNOSIS — Z0000 Encounter for general adult medical examination without abnormal findings: Secondary | ICD-10-CM | POA: Diagnosis not present

## 2017-12-16 ENCOUNTER — Other Ambulatory Visit: Payer: Self-pay | Admitting: Emergency Medicine

## 2017-12-20 DIAGNOSIS — J441 Chronic obstructive pulmonary disease with (acute) exacerbation: Secondary | ICD-10-CM | POA: Diagnosis not present

## 2017-12-22 ENCOUNTER — Encounter: Payer: Self-pay | Admitting: Emergency Medicine

## 2017-12-22 ENCOUNTER — Ambulatory Visit (INDEPENDENT_AMBULATORY_CARE_PROVIDER_SITE_OTHER): Payer: Medicare Other | Admitting: Emergency Medicine

## 2017-12-22 DIAGNOSIS — J449 Chronic obstructive pulmonary disease, unspecified: Secondary | ICD-10-CM | POA: Diagnosis not present

## 2017-12-22 DIAGNOSIS — R0902 Hypoxemia: Secondary | ICD-10-CM | POA: Diagnosis not present

## 2017-12-22 DIAGNOSIS — J301 Allergic rhinitis due to pollen: Secondary | ICD-10-CM | POA: Diagnosis not present

## 2017-12-22 NOTE — Assessment & Plan Note (Signed)
Documented with ambulation in the office.  He is not using oxygen reliably, just as needed, often in the evenings.  Encouraged him to use more reliably with exertion

## 2017-12-22 NOTE — Patient Instructions (Signed)
Please continue Trelegy 1 inhalation daily.  Remember to rinse and gargle after using. Keep your albuterol available to use either 1 nebulizer treatment or 2 puffs up to every 4 hours if you needed for shortness of breath, chest tightness, wheezing. Flu shot and pneumonia shot up-to-date. Please continue Zyrtec and fluticasone nasal spray as you have been using them. You would probably benefit from wearing your oxygen when you exert yourself. Follow with Dr Lamonte Sakai in 6 months or sooner if you have any problems

## 2017-12-22 NOTE — Assessment & Plan Note (Signed)
Good control overall.  No exacerbations since last time.  Minimal daily symptoms.  Please continue Trelegy 1 inhalation daily.  Remember to rinse and gargle after using. Keep your albuterol available to use either 1 nebulizer treatment or 2 puffs up to every 4 hours if you needed for shortness of breath, chest tightness, wheezing. Flu shot and pneumonia shot up-to-date. Please continue Zyrtec and fluticasone nasal spray as you have been using them. You would probably benefit from wearing your oxygen when you exert yourself. Follow with Dr Lamonte Sakai in 6 months or sooner if you have any problems

## 2017-12-22 NOTE — Assessment & Plan Note (Signed)
Zyrtec and nasal steroid as above

## 2017-12-22 NOTE — Progress Notes (Signed)
Subjective:    Patient ID: Troy [name], male    DOB: 11-25-1940, 77 y.o.   MRN: [id number]  HPI 77 yo former smoker (100 pk-yrs), hx CAD and dilated CM, A Fib, DM, DVT's, AICD device. Dx w COPD several years ago. Referred by Dr Troy [name] for dyspnea, cough and congestion.  He can only walk 25 feet. He occasionally wheezes.  ROV 12/05/16 --this is a follow-up visit.  Patient is 77 with a history of severe obstructive lung disease, coronary disease, atrial fibrillation with a dilated cardiomyopathy and AICD, diabetes.  I saw him one month ago at which time we stop Spiriva and [facility] and started him on Trelegy.  He remains on Zyrtec, fluticasone nasal spray, Mucinex for allergic rhinitis. He tells me that he feels that he is benefiting from the Trelegy. He has albuterol, HFA and nebs, uses about 2x a day. He has O2 that he uses at night, not w exertion- uses prn for SOB.   ROV 06/03/17 --77 year old man with coronary disease and a dilated cardiomyopathy, atrial fibrillation, diabetes, AICD DVTs.  We followed him here for severe COPD.  He also has allergic rhinitis and cough.  Currently managed on Trelegy. He believes that it is working well.  He uses albuterol approximately 2x a day. He was recently treated in April for increased cough, mucous production, wheeze. He was treated with abx, pred. Sx resolved. He is currently back to baseline, is able to exert. He still hears some wheeze. Minimal cough or sputum. Uses o2 at night, rarely w exertion.  He is on flonase nasal spray, zyrtec.   ROV 12/22/17 --this is a follow-up visit for Troy [name] who has history as outlined above.  We follow him for severe COPD, allergic rhinitis with associated cough.  He has documented exertional hypoxemia, does not reliably use his oxygen with exertion.  Since last time he reports that he has been doing well. He is able to get around his house, do his ADL, his shopping. Denies any cough or wheeze with any frequency.  He  is on trelegy, uses albuterol nebs about 2x a day. He is on zyrtec, flonase. Flu shot is up to date. PNA vaccine up to date. No flares since last time. He was hospitalized at [facility] for diverticulitis   Review of Systems  Constitutional: Negative for fever and unexpected weight change.  HENT: Negative for congestion, dental problem, ear pain, nosebleeds, postnasal drip, rhinorrhea, sinus pressure, sneezing, sore throat and trouble swallowing.   Eyes: Negative for redness and itching.  Respiratory: Positive for cough and shortness of breath. Negative for chest tightness and wheezing.        Congestion  Cardiovascular: Negative for palpitations and leg swelling.  Gastrointestinal: Negative for nausea and vomiting.  Genitourinary: Negative for dysuria.  Musculoskeletal: Negative for joint swelling.  Skin: Negative for rash.  Neurological: Negative for headaches.  Hematological: Does not bruise/bleed easily.  Psychiatric/Behavioral: Negative for dysphoric mood. The patient is not nervous/anxious.       Objective:   Physical Exam Vitals:   12/22/17 1443  BP: 116/70  Pulse: 70  SpO2: 94%  Weight: 185 lb (83.9 kg)  Height: 5\' 7"  (1.702 m)  Gen: Pleasant, well-nourished, in no distress,  normal affect  ENT: No lesions,  mouth clear,  oropharynx clear, disconjugate gaze, no postnasal drip  Neck: No JVD, no stridor  Lungs: normal excursion, somewhat hyperinflated, no wheeze or crackles.   Cardiovascular: RRR, heart sounds normal, no  murmur or gallops, no peripheral edema  Musculoskeletal: No deformities, no cyanosis or clubbing  Neuro: alert, non focal  Skin: Warm, no lesions or rashes     Assessment & Plan:  COPD (chronic obstructive pulmonary disease) (HCC) Good control overall.  No exacerbations since last time.  Minimal daily symptoms.  Please continue Trelegy 1 inhalation daily.  Remember to rinse and gargle after using. Keep your albuterol available to use either  1 nebulizer treatment or 2 puffs up to every 4 hours if you needed for shortness of breath, chest tightness, wheezing. Flu shot and pneumonia shot up-to-date. Please continue Zyrtec and fluticasone nasal spray as you have been using them. You would probably benefit from wearing your oxygen when you exert yourself. Follow with Dr Lamonte Sakai in 6 months or sooner if you have any problems  Hypoxia Documented with ambulation in the office.  He is not using oxygen reliably, just as needed, often in the evenings.  Encouraged him to use more reliably with exertion  Allergic rhinitis Zyrtec and nasal steroid as above  Baltazar Apo, MD, PhD 12/22/2017, 3:16 PM [facility] [phone] or if no answer [phone]

## 2017-12-25 ENCOUNTER — Telehealth: Payer: Self-pay | Admitting: Emergency Medicine

## 2017-12-25 MED ORDER — ALBUTEROL SULFATE HFA 108 (90 BASE) MCG/ACT IN AERS: 1.0000 | INHALATION_SPRAY | Freq: Four times a day (QID) | RESPIRATORY_TRACT | 5 refills | Status: DC | PRN

## 2017-12-25 NOTE — Telephone Encounter (Signed)
Called and spoke to patient, requesting a refill of albuterol inhaler. Pharmacy confirmed. Refill sent. Nothing further is needed at this time.

## 2017-12-30 ENCOUNTER — Ambulatory Visit (INDEPENDENT_AMBULATORY_CARE_PROVIDER_SITE_OTHER): Payer: Medicare Other | Admitting: *Deleted

## 2017-12-30 DIAGNOSIS — Z5181 Encounter for therapeutic drug level monitoring: Secondary | ICD-10-CM | POA: Diagnosis not present

## 2017-12-30 DIAGNOSIS — I482 Chronic atrial fibrillation, unspecified: Secondary | ICD-10-CM

## 2017-12-30 LAB — POCT INR: INR: 2.7 (ref 2.0–3.0)

## 2017-12-30 NOTE — Patient Instructions (Signed)
Continue coumadin 2 tablets daily.  Recheck INR in 4 weeks

## 2018-01-20 DIAGNOSIS — J441 Chronic obstructive pulmonary disease with (acute) exacerbation: Secondary | ICD-10-CM | POA: Diagnosis not present

## 2018-01-23 ENCOUNTER — Other Ambulatory Visit: Payer: Self-pay | Admitting: Interventional Cardiology

## 2018-01-23 DIAGNOSIS — E782 Mixed hyperlipidemia: Secondary | ICD-10-CM

## 2018-01-23 DIAGNOSIS — Z8679 Personal history of other diseases of the circulatory system: Secondary | ICD-10-CM

## 2018-01-29 ENCOUNTER — Ambulatory Visit (INDEPENDENT_AMBULATORY_CARE_PROVIDER_SITE_OTHER): Payer: Medicare Other | Admitting: Pharmacist

## 2018-01-29 ENCOUNTER — Ambulatory Visit (INDEPENDENT_AMBULATORY_CARE_PROVIDER_SITE_OTHER): Payer: Medicare Other

## 2018-01-29 DIAGNOSIS — I6523 Occlusion and stenosis of bilateral carotid arteries: Secondary | ICD-10-CM

## 2018-01-29 DIAGNOSIS — Z5181 Encounter for therapeutic drug level monitoring: Secondary | ICD-10-CM | POA: Diagnosis not present

## 2018-01-29 DIAGNOSIS — I482 Chronic atrial fibrillation, unspecified: Secondary | ICD-10-CM | POA: Diagnosis not present

## 2018-01-29 LAB — POCT INR: INR: 2.5 (ref 2.0–3.0)

## 2018-01-29 NOTE — Patient Instructions (Signed)
Description   Continue coumadin 2 tablets daily.  Recheck INR in 4 weeks

## 2018-01-30 ENCOUNTER — Telehealth: Payer: Self-pay

## 2018-01-30 DIAGNOSIS — J441 Chronic obstructive pulmonary disease with (acute) exacerbation: Secondary | ICD-10-CM | POA: Diagnosis not present

## 2018-01-30 DIAGNOSIS — I739 Peripheral vascular disease, unspecified: Principal | ICD-10-CM

## 2018-01-30 DIAGNOSIS — I779 Disorder of arteries and arterioles, unspecified: Secondary | ICD-10-CM

## 2018-01-30 NOTE — Telephone Encounter (Signed)
-----   Message from Jettie Booze, MD sent at 01/30/2018 11:26 AM EST ----- Moderate carotid disease bilaterally.  Repeat study in one year.

## 2018-01-30 NOTE — Telephone Encounter (Signed)
The patient has been notified of the result and verbalized understanding.  All questions (if any) were answered. Repeat Study ordered for 1 year.

## 2018-02-15 ENCOUNTER — Other Ambulatory Visit: Payer: Self-pay | Admitting: Interventional Cardiology

## 2018-02-15 ENCOUNTER — Other Ambulatory Visit: Payer: Self-pay | Admitting: Emergency Medicine

## 2018-02-26 ENCOUNTER — Ambulatory Visit (INDEPENDENT_AMBULATORY_CARE_PROVIDER_SITE_OTHER): Payer: Medicare Other | Admitting: Pharmacist

## 2018-02-26 DIAGNOSIS — Z5181 Encounter for therapeutic drug level monitoring: Secondary | ICD-10-CM

## 2018-02-26 DIAGNOSIS — I482 Chronic atrial fibrillation, unspecified: Secondary | ICD-10-CM | POA: Diagnosis not present

## 2018-02-26 LAB — POCT INR: INR: 3.1 — AB (ref 2.0–3.0)

## 2018-02-26 NOTE — Patient Instructions (Signed)
Description   Eat a serving of greens today and continue coumadin 2 tablets daily.  Recheck INR in 4 weeks

## 2018-03-05 ENCOUNTER — Other Ambulatory Visit: Payer: Self-pay | Admitting: Internal Medicine

## 2018-03-05 DIAGNOSIS — E782 Mixed hyperlipidemia: Secondary | ICD-10-CM

## 2018-03-05 DIAGNOSIS — Z8679 Personal history of other diseases of the circulatory system: Secondary | ICD-10-CM

## 2018-03-09 DIAGNOSIS — H353132 Nonexudative age-related macular degeneration, bilateral, intermediate dry stage: Secondary | ICD-10-CM | POA: Diagnosis not present

## 2018-03-09 DIAGNOSIS — E113291 Type 2 diabetes mellitus with mild nonproliferative diabetic retinopathy without macular edema, right eye: Secondary | ICD-10-CM | POA: Diagnosis not present

## 2018-03-09 DIAGNOSIS — H34232 Retinal artery branch occlusion, left eye: Secondary | ICD-10-CM | POA: Diagnosis not present

## 2018-03-09 DIAGNOSIS — H35033 Hypertensive retinopathy, bilateral: Secondary | ICD-10-CM | POA: Diagnosis not present

## 2018-03-10 ENCOUNTER — Encounter: Payer: Self-pay | Admitting: Internal Medicine

## 2018-03-10 ENCOUNTER — Ambulatory Visit (INDEPENDENT_AMBULATORY_CARE_PROVIDER_SITE_OTHER): Payer: Medicare Other | Admitting: Internal Medicine

## 2018-03-10 VITALS — BP 118/68 | HR 65 | Ht 66.0 in | Wt 181.0 lb

## 2018-03-10 DIAGNOSIS — I480 Paroxysmal atrial fibrillation: Secondary | ICD-10-CM

## 2018-03-10 NOTE — Progress Notes (Signed)
HPI Troy [name] returns today for ongoing evaluation and management of his biventricular ICD in the setting of chronic systolic heart failure.  He is a very pleasant 78 year old man with the above problems, and underwent ICD insertion over 6 years ago.  Since I saw the patient last a year ago he has been stable.  He has not been in the hospital, and he denies chest pain, shortness of breath, syncope, or symptomatic ICD therapy.  He does have asymptomatic paroxysmal atrial fibrillation and has been on systemic anticoagulation for thromboembolic prevention.  Allergies  Allergen Reactions  . Benadryl [Diphenhydramine Hcl] Nausea And Vomiting  . Diphenhydramine Other (See Comments)     Current Outpatient Medications  Medication Sig Dispense Refill  . acetaminophen (TYLENOL) 500 MG tablet Take 500 mg by mouth every 6 (six) hours as needed for mild pain or moderate pain.    Marland Kitchen albuterol (ACCUNEB) 1.25 MG/3ML nebulizer solution INHALE THE CONTENTS OF ONE VIAL INTO THE LUNGS EVERY 6 HOURS AS NEEDED 300 mL 5  . albuterol (PROAIR HFA) 108 (90 Base) MCG/ACT inhaler Inhale 1-2 puffs into the lungs every 6 (six) hours as needed for wheezing or shortness of breath. 8.5 g 5  . alfuzosin (UROXATRAL) 10 MG 24 hr tablet Take 10 mg by mouth daily with breakfast.     . allopurinol (ZYLOPRIM) 100 MG tablet Take 1 tablet (100 mg total) daily by mouth. 90 tablet 0  . atorvastatin (LIPITOR) 10 MG tablet TAKE 1 TABLET BY MOUTH EVERY DAY 90 tablet 2  . carvedilol (COREG) 12.5 MG tablet Take 1 tablet (12.5 mg total) by mouth 2 (two) times daily with a meal. 60 tablet 6  . cetirizine (ZYRTEC) 10 MG tablet Take 10 mg by mouth daily.    . digoxin (LANOXIN) 0.125 MG tablet Take 0.5 tablets (0.0625 mg total) daily by mouth. 45 tablet 0  . fluticasone (FLONASE) 50 MCG/ACT nasal spray Place 2 sprays into both nostrils at bedtime as needed for allergies. 16 g 3  . furosemide (LASIX) 40 MG tablet Take 1.5 tablets (60 mg  total) by mouth 2 (two) times daily. 90 tablet 11  . TRELEGY ELLIPTA 100-62.5-25 MCG/INH AEPB INHALE 1 PUFF BY MOUTH DAILY 60 each 5  . warfarin (COUMADIN) 1 MG tablet TAKE TWO TABLETS BY MOUTH EVERY DAY OR AS DIRECTED 70 tablet 3   No current facility-administered medications for this visit.      Past Medical History:  Diagnosis Date  . Atrial fibrillation ([place])   . CAD (coronary artery disease) 1996   status post PCI of the RCA   . Community acquired pneumonia 08/24/2015  . Congestive heart failure, unspecified   . COPD (chronic obstructive pulmonary disease) ([facility])    Pt on home O2 at night and PRN, unsure of date of diagnosis.  . Diabetes mellitus, type 2 ([place])   . DJD (degenerative joint disease)   . GERD (gastroesophageal reflux disease)   . Gout   . HTN (hypertension)   . 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 Nephrolithiasis   . Other primary cardiomyopathies   . Personal history of DVT (deep vein thrombosis)   . Prostatitis   . Shingles     ROS:   All systems reviewed and negative except as noted in the HPI.   Past Surgical History:  Procedure Laterality Date  . BACK SURGERY    . CARDIAC DEFIBRILLATOR PLACEMENT    . CORONARY STENT PLACEMENT    .  PACEMAKER INSERTION       Family History  Problem Relation Age of Onset  . Heart disease Father   . Cancer Father        LUNG  . Colon cancer Mother   . Stroke Paternal Uncle   . Heart attack Neg Hx   . Hyperlipidemia Neg Hx   . Hypertension Neg Hx      Social History   Socioeconomic History  . Marital status: Single    Spouse name: Not on file  . Number of children: 0  . Years of education: Not on file  . Highest education level: Not on file  Occupational History  . Occupation: Retired from [employer]  Social Needs  . Financial resource strain: Not on file  . Food insecurity:    Worry: Not on file    Inability: Not on file  . Transportation needs:    Medical: Not on file     Non-medical: Not on file  Tobacco Use  . Smoking status: Former Smoker    Packs/day: 4.00    Years: 50.00    Pack years: 200.00    Types: Cigarettes    Last attempt to quit: 01/22/1980    Years since quitting: 38.1  . Smokeless tobacco: Never Used  Substance and Sexual Activity  . Alcohol use: No    Alcohol/week: 0.0 standard drinks    Comment: denies  . Drug use: No  . Sexual activity: Never  Lifestyle  . Physical activity:    Days per week: Not on file    Minutes per session: Not on file  . Stress: Not on file  Relationships  . Social connections:    Talks on phone: Not on file    Gets together: Not on file    Attends religious service: Not on file    Active member of club or organization: Not on file    Attends meetings of clubs or organizations: Not on file    Relationship status: Not on file  . Intimate partner violence:    Fear of current or ex partner: Not on file    Emotionally abused: Not on file    Physically abused: Not on file    Forced sexual activity: Not on file  Other Topics Concern  . Not on file  Social History Narrative   Lives alone.  Single.  No children.  Ambulates with a cane.  Has a deceased brother and is estranged from sisters.     BP 118/68   Pulse 65   Ht 5\' 6"  (1.676 m)   Wt 181 lb (82.1 kg)   SpO2 94%   BMI 29.21 kg/m   Physical Exam:  Well appearing NAD HEENT: Unremarkable Neck:  No JVD, no thyromegally Lymphatics:  No adenopathy Back:  No CVA tenderness Lungs:  Clear with no wheezes HEART:  Regular rate rhythm, no murmurs, no rubs, no clicks Abd:  soft, positive bowel sounds, no organomegally, no rebound, no guarding Ext:  2 plus pulses, no edema, no cyanosis, no clubbing Skin:  No rashes no nodules Neuro:  CN II through XII intact, motor grossly intact  DEVICE  Normal device function.  See PaceArt for details.   Assess/Plan: 1. Chronic systolic heart failure - his symptoms remain class 2. He will continue his current  meds. 2. ICD - his [employer] Biv ICD is working normally. We will recheck in several months. 3. PAF - he is maintaining NSR. He will continue his systemic anti-coagulation.  Mikle Bosworth.D.

## 2018-03-10 NOTE — Patient Instructions (Signed)
Medication Instructions:  Your physician recommends that you continue on your current medications as directed. Please refer to the Current Medication list given to you today.  If you need a refill on your cardiac medications before your next appointment, please call your pharmacy.   Lab work: NONE   If you have labs (blood work) drawn today and your tests are completely normal, you will receive your results only by: . MyChart Message (if you have MyChart) OR . A paper copy in the mail If you have any lab test that is abnormal or we need to change your treatment, we will call you to review the results.  Testing/Procedures: NONE   Follow-Up: At [facility], you and your health needs are our priority.  As part of our continuing mission to provide you with exceptional heart care, we have created designated Provider Care Teams.  These Care Teams include your primary Cardiologist (physician) and Advanced Practice Providers (APPs -  Physician Assistants and Nurse Practitioners) who all work together to provide you with the care you need, when you need it. You will need a follow up appointment in 1 years.  Please call our office 2 months in advance to schedule this appointment.  You may see Gregg Taylor, MD or one of the following Advanced Practice Providers on your designated Care Team:   Amber Seiler, NP . Renee Ursuy, PA-C  Any Other Special Instructions Will Be Listed Below (If Applicable). Thank you for choosing [facility]!     

## 2018-03-11 ENCOUNTER — Encounter: Payer: Self-pay | Admitting: Internal Medicine

## 2018-03-17 ENCOUNTER — Other Ambulatory Visit: Payer: Self-pay | Admitting: Emergency Medicine

## 2018-03-26 ENCOUNTER — Ambulatory Visit (INDEPENDENT_AMBULATORY_CARE_PROVIDER_SITE_OTHER): Payer: Medicare Other | Admitting: *Deleted

## 2018-03-26 DIAGNOSIS — I482 Chronic atrial fibrillation, unspecified: Secondary | ICD-10-CM

## 2018-03-26 DIAGNOSIS — Z5181 Encounter for therapeutic drug level monitoring: Secondary | ICD-10-CM

## 2018-03-26 LAB — POCT INR: INR: 2.5 (ref 2.0–3.0)

## 2018-03-26 NOTE — Patient Instructions (Signed)
Continue coumadin 2 tablets daily.  Recheck INR in 5 weeks

## 2018-03-31 DIAGNOSIS — L259 Unspecified contact dermatitis, unspecified cause: Secondary | ICD-10-CM | POA: Diagnosis not present

## 2018-04-06 DIAGNOSIS — M109 Gout, unspecified: Secondary | ICD-10-CM | POA: Diagnosis not present

## 2018-04-06 DIAGNOSIS — Z0000 Encounter for general adult medical examination without abnormal findings: Secondary | ICD-10-CM | POA: Diagnosis not present

## 2018-04-06 DIAGNOSIS — I10 Essential (primary) hypertension: Secondary | ICD-10-CM | POA: Diagnosis not present

## 2018-04-06 DIAGNOSIS — E119 Type 2 diabetes mellitus without complications: Secondary | ICD-10-CM | POA: Diagnosis not present

## 2018-04-06 DIAGNOSIS — J440 Chronic obstructive pulmonary disease with acute lower respiratory infection: Secondary | ICD-10-CM | POA: Diagnosis not present

## 2018-04-13 DIAGNOSIS — Z86711 Personal history of pulmonary embolism: Secondary | ICD-10-CM | POA: Diagnosis not present

## 2018-04-13 DIAGNOSIS — I482 Chronic atrial fibrillation, unspecified: Secondary | ICD-10-CM | POA: Diagnosis not present

## 2018-04-13 DIAGNOSIS — I5023 Acute on chronic systolic (congestive) heart failure: Secondary | ICD-10-CM | POA: Diagnosis not present

## 2018-04-13 DIAGNOSIS — E119 Type 2 diabetes mellitus without complications: Secondary | ICD-10-CM | POA: Diagnosis not present

## 2018-04-13 DIAGNOSIS — J449 Chronic obstructive pulmonary disease, unspecified: Secondary | ICD-10-CM | POA: Diagnosis not present

## 2018-04-13 DIAGNOSIS — Z79899 Other long term (current) drug therapy: Secondary | ICD-10-CM | POA: Diagnosis not present

## 2018-04-13 DIAGNOSIS — J441 Chronic obstructive pulmonary disease with (acute) exacerbation: Secondary | ICD-10-CM | POA: Diagnosis not present

## 2018-04-13 DIAGNOSIS — Z87891 Personal history of nicotine dependence: Secondary | ICD-10-CM | POA: Diagnosis not present

## 2018-04-13 DIAGNOSIS — I110 Hypertensive heart disease with heart failure: Secondary | ICD-10-CM | POA: Diagnosis not present

## 2018-04-13 DIAGNOSIS — M109 Gout, unspecified: Secondary | ICD-10-CM | POA: Diagnosis not present

## 2018-04-13 DIAGNOSIS — Z7901 Long term (current) use of anticoagulants: Secondary | ICD-10-CM | POA: Diagnosis not present

## 2018-04-21 DIAGNOSIS — J441 Chronic obstructive pulmonary disease with (acute) exacerbation: Secondary | ICD-10-CM | POA: Diagnosis not present

## 2018-04-27 ENCOUNTER — Telehealth: Payer: Self-pay | Admitting: Interventional Cardiology

## 2018-04-27 NOTE — Telephone Encounter (Signed)
Patient is having issues with his COPD, having a lot of coughing but not bringing anything and he is wheezing.  It has been going on for about a week.

## 2018-04-27 NOTE — Telephone Encounter (Signed)
Called patient. Patient states that he has a non-productive cough and is wheezing x 1 week. He denies fever, SOB, swelling, weight gain, chest pain or any other Sx. Patient states that he was in the hospital about a week ago in [place] and was negative for COVID-19. Patient has been using his inhalers and nebulizers as prescribed. Instructed the patient to try OTC Claritin or cough medicine and to reach out to PCP and Pulmonologist. Patient verbalized understanding.

## 2018-04-28 ENCOUNTER — Telehealth: Payer: Self-pay | Admitting: Pulmonary Disease

## 2018-04-28 MED ORDER — BENZONATATE 100 MG PO CAPS: 200.0000 mg | ORAL_CAPSULE | Freq: Three times a day (TID) | ORAL | 1 refills | Status: DC

## 2018-04-28 MED ORDER — PREDNISONE 10 MG PO TABS: ORAL_TABLET | ORAL | 0 refills | Status: DC

## 2018-04-28 NOTE — Telephone Encounter (Signed)
Pt would like Tessalon sent to pharmacy What strength of Tessalon?

## 2018-04-28 NOTE — Telephone Encounter (Signed)
Advised delsym cough syrup twice daily. He can also use tessalon perles if he would like for cough. Will send in prednisone taper.

## 2018-04-28 NOTE — Telephone Encounter (Signed)
lmom 

## 2018-04-28 NOTE — Telephone Encounter (Signed)
Sent - thank you

## 2018-04-28 NOTE — Telephone Encounter (Signed)
Primary Pulmonologist: Byrum Last office visit and with whom: 12/22/2017 What do we see them for (pulmonary problems): COPD  Reason for call:  Pt c/o cough (non productive) and wheezing for a while nothing new. Pt only sob with exertion and he denies chest tightness/pressure. Pt uses Trelegy daily and Albuterol every 4 hrs. He says he is just tired of coughing and wheezing.  In the last month, have you been in contact with someone who was confirmed or suspected to have Con[place]avirus / COVID-19?  No Have you traveled internationally or to an area with more than 100 reported cases of Coronavirus / COVID-19? Do you have any of the following symptoms developed in the last 30 days? Fever: none Cough: yes non-productive Shortness of breath:  None more than usual When did your symptoms start? Ongoing nothing new.  If the patient has a fever, what is the last reading?  (use n/a if patient denies fever)   . IF THE PATIENT STATES THEY DO NOT OWN A THERMOMETER, THEY MUST GO AND PURCHASE ONE When did the fever start?: n/a Have you taken any medication to suppress a fever (ie Ibuprofen, Aleve, Tylenol)?: n/a

## 2018-04-28 NOTE — Telephone Encounter (Signed)
Spoke with pt and advised rx sent to pharmacy. Nothing further is needed.   

## 2018-04-28 NOTE — Telephone Encounter (Signed)
Pt is calling back [phone]

## 2018-04-29 ENCOUNTER — Telehealth: Payer: Self-pay | Admitting: *Deleted

## 2018-04-29 ENCOUNTER — Telehealth: Payer: Self-pay | Admitting: Interventional Cardiology

## 2018-04-29 NOTE — Telephone Encounter (Signed)
04/29/18 LMTCB needs to be pre-registered/Covid 19 Screened-srs COVID-19 Pre-Screening Questions:   Do you currently have a fever?    Have you recently travelled on a cruise, internationally, or to [place], [place], [place], [place], [place], or [place], [place] [employer]) ?    Have you been in contact with someone that is currently pending confirmation of Covid19 testing or has been confirmed to have the [place] virus?    Are you currently experiencing fatigue or cough?

## 2018-04-29 NOTE — Telephone Encounter (Signed)
° °  COVID-19 Pre-Screening Questions: ° °• Do you currently have a fever?NO ° ° °• Have you recently travelled on a cruise, internationally, or to [place], [place], [place], [place], [place], or [place], [place] ([employer]) ? NO °•  °• Have you been in contact with someone that is currently pending confirmation of Covid19 testing or has been confirmed to have the Covid19 virus?  NO °•  °Are you currently experiencing fatigue or cough? NO ° ° °   ° ° ° ° °

## 2018-04-30 ENCOUNTER — Ambulatory Visit (INDEPENDENT_AMBULATORY_CARE_PROVIDER_SITE_OTHER): Payer: Medicare Other | Admitting: *Deleted

## 2018-04-30 DIAGNOSIS — I482 Chronic atrial fibrillation, unspecified: Secondary | ICD-10-CM

## 2018-04-30 DIAGNOSIS — Z5181 Encounter for therapeutic drug level monitoring: Secondary | ICD-10-CM

## 2018-04-30 LAB — POCT INR: INR: 3.3 — AB (ref 2.0–3.0)

## 2018-04-30 NOTE — Patient Instructions (Signed)
Hold coumadin tonight then resume 2 tablets daily.  Recheck INR in 6 weeks

## 2018-05-05 DIAGNOSIS — I5023 Acute on chronic systolic (congestive) heart failure: Secondary | ICD-10-CM | POA: Diagnosis not present

## 2018-05-05 DIAGNOSIS — J449 Chronic obstructive pulmonary disease, unspecified: Secondary | ICD-10-CM | POA: Diagnosis not present

## 2018-05-21 DIAGNOSIS — J441 Chronic obstructive pulmonary disease with (acute) exacerbation: Secondary | ICD-10-CM | POA: Diagnosis not present

## 2018-06-11 ENCOUNTER — Other Ambulatory Visit: Payer: Self-pay | Admitting: Interventional Cardiology

## 2018-06-11 ENCOUNTER — Ambulatory Visit (INDEPENDENT_AMBULATORY_CARE_PROVIDER_SITE_OTHER): Payer: Medicare Other | Admitting: *Deleted

## 2018-06-11 DIAGNOSIS — M109 Gout, unspecified: Secondary | ICD-10-CM | POA: Diagnosis not present

## 2018-06-11 DIAGNOSIS — Z5181 Encounter for therapeutic drug level monitoring: Secondary | ICD-10-CM | POA: Diagnosis not present

## 2018-06-11 DIAGNOSIS — I480 Paroxysmal atrial fibrillation: Secondary | ICD-10-CM | POA: Diagnosis not present

## 2018-06-11 DIAGNOSIS — I482 Chronic atrial fibrillation, unspecified: Secondary | ICD-10-CM | POA: Diagnosis not present

## 2018-06-11 DIAGNOSIS — I5020 Unspecified systolic (congestive) heart failure: Secondary | ICD-10-CM | POA: Diagnosis not present

## 2018-06-11 DIAGNOSIS — J449 Chronic obstructive pulmonary disease, unspecified: Secondary | ICD-10-CM | POA: Diagnosis not present

## 2018-06-11 LAB — POCT INR: INR: 2.4 (ref 2.0–3.0)

## 2018-06-11 NOTE — Patient Instructions (Signed)
Continue coumadin 2 tablets daily.  Recheck INR in 6 weeks

## 2018-06-19 ENCOUNTER — Ambulatory Visit: Payer: Medicare Other | Admitting: Emergency Medicine

## 2018-06-19 ENCOUNTER — Telehealth: Payer: Self-pay

## 2018-06-19 NOTE — Telephone Encounter (Signed)
Virtual Visit Pre-Appointment Phone Call TELEPHONE CALL NOTE  Troy [name] has been deemed a candidate for a follow-up tele-health visit to limit community exposure during the Covid-19 pandemic. I spoke with the patient via phone to ensure availability of phone/video source, confirm preferred email & phone number, and discuss instructions and expectations.  I reminded Troy [name] to be prepared with any vital sign and/or heart rhythm information that could potentially be obtained via home monitoring, at the time of his visit. I reminded Troy [name] to expect a phone call prior to his visit.  Patient agrees to consent below.  Cleon Gustin, RN 06/19/2018 10:53 AM   FULL LENGTH CONSENT FOR TELE-HEALTH VISIT   I hereby voluntarily request, consent and authorize [facility] and its employed or contracted physicians, physician assistants, nurse practitioners or other licensed health care professionals (the Practitioner), to provide me with telemedicine health care services (the "Services") as deemed necessary by the treating Practitioner. I acknowledge and consent to receive the Services by the Practitioner via telemedicine. I understand that the telemedicine visit will involve communicating with the Practitioner through live audiovisual communication technology and the disclosure of certain medical information by electronic transmission. I acknowledge that I have been given the opportunity to request an in-person assessment or other available alternative prior to the telemedicine visit and am voluntarily participating in the telemedicine visit.  I understand that I have the right to withhold or withdraw my consent to the use of telemedicine in the course of my care at any time, without affecting my right to future care or treatment, and that the Practitioner or I may terminate the telemedicine visit at any time. I understand that I have the right to inspect all information  obtained and/or recorded in the course of the telemedicine visit and may receive copies of available information for a reasonable fee.  I understand that some of the potential risks of receiving the Services via telemedicine include:  Marland Kitchen Delay or interruption in medical evaluation due to technological equipment failure or disruption; . Information transmitted may not be sufficient (e.g. poor resolution of images) to allow for appropriate medical decision making by the Practitioner; and/or  . In rare instances, security protocols could fail, causing a breach of personal health information.  Furthermore, I acknowledge that it is my responsibility to provide information about my medical history, conditions and care that is complete and accurate to the best of my ability. I acknowledge that Practitioner's advice, recommendations, and/or decision may be based on factors not within their control, such as incomplete or inaccurate data provided by me or distortions of diagnostic images or specimens that may result from electronic transmissions. I understand that the practice of medicine is not an exact science and that Practitioner makes no warranties or guarantees regarding treatment outcomes. I acknowledge that I will receive a copy of this consent concurrently upon execution via email to the email address I last provided but may also request a printed copy by calling the office of [place].    I understand that my insurance will be billed for this visit.   I have read or had this consent read to me. . I understand the contents of this consent, which adequately explains the benefits and risks of the Services being provided via telemedicine.  . I have been provided ample opportunity to ask questions regarding this consent and the Services and have had my questions answered to my satisfaction. . I give  my informed consent for the services to be provided through the use of telemedicine in my medical care   By participating in this telemedicine visit I agree to the above.

## 2018-06-21 DIAGNOSIS — J441 Chronic obstructive pulmonary disease with (acute) exacerbation: Secondary | ICD-10-CM | POA: Diagnosis not present

## 2018-06-22 ENCOUNTER — Other Ambulatory Visit: Payer: Self-pay

## 2018-06-22 ENCOUNTER — Telehealth (INDEPENDENT_AMBULATORY_CARE_PROVIDER_SITE_OTHER): Payer: Medicare Other | Admitting: Interventional Cardiology

## 2018-06-22 ENCOUNTER — Encounter: Payer: Self-pay | Admitting: Interventional Cardiology

## 2018-06-22 VITALS — Ht 66.0 in | Wt 180.0 lb

## 2018-06-22 DIAGNOSIS — I25119 Atherosclerotic heart disease of native coronary artery with unspecified angina pectoris: Secondary | ICD-10-CM

## 2018-06-22 DIAGNOSIS — I5022 Chronic systolic (congestive) heart failure: Secondary | ICD-10-CM

## 2018-06-22 DIAGNOSIS — I482 Chronic atrial fibrillation, unspecified: Secondary | ICD-10-CM | POA: Diagnosis not present

## 2018-06-22 DIAGNOSIS — N183 Chronic kidney disease, stage 3 unspecified: Secondary | ICD-10-CM

## 2018-06-22 NOTE — Patient Instructions (Addendum)
Medication Instructions:  Your physician recommends that you continue on your current medications as directed. Please refer to the Current Medication list given to you today.  If you need a refill on your cardiac medications before your next appointment, please call your pharmacy.   Lab work: Your physician recommends that you return for lab work (CBC, CMET) on 09/23/18   If you have labs (blood work) drawn today and your tests are completely normal, you will receive your results only by: Marland Kitchen MyChart Message (if you have MyChart) OR . A paper copy in the mail If you have any lab test that is abnormal or we need to change your treatment, we will call you to review the results.  Testing/Procedures: None ordered  Follow-Up: At [facility], you and your health needs are our priority.  As part of our continuing mission to provide you with exceptional heart care, we have created designated Provider Care Teams.  These Care Teams include your primary Cardiologist (physician) and Advanced Practice Providers (APPs -  Physician Assistants and Nurse Practitioners) who all work together to provide you with the care you need, when you need it. . You will need a follow up appointment in 6 months.  Please call our office 2 months in advance to schedule this appointment.  You may see Casandra Doffing, MD or one of the following Advanced Practice Providers on your designated Care Team:   . Lyda Jester, PA-C . Dayna Dunn, PA-C . Ermalinda Barrios, PA-C  Any Other Special Instructions Will Be Listed Below (If Applicable).

## 2018-06-22 NOTE — Progress Notes (Signed)
Virtual Visit via Video Note   This visit type was conducted due to national recommendations for restrictions regarding the COVID-19 Pandemic (e.g. social distancing) in an effort to limit this patient's exposure and mitigate transmission in our community.  Due to his co-morbid illnesses, this patient is at least at moderate risk for complications without adequate follow up.  This format is felt to be most appropriate for this patient at this time.  All issues noted in this document were discussed and addressed.  A limited physical exam was performed with this format.  Please refer to the patient's chart for his consent to telehealth for [facility].   Camera could not be accessed by patient.   Date:  06/22/2018   ID:  Troy [name], DOB 10/27/1940, MRN [id number]  Patient Location: Home Provider Location: Home  PCP:  Neale Burly, MD  Cardiologist:  No primary care provider on file. Troy [name] Electrophysiologist:  Troy Peru, MD   Evaluation Performed:  Follow-Up Visit  Chief Complaint:  CAD  History of Present Illness:    [name] is a 78 y.o. male with chronic systolic heart failure, BiV ICD.  He has a mixed CM, chronic class 2 CHF, s/p biv-ICD-PLACED 2012, chb, and PAF. CAD, PCI of RCA in 1996 .   Echo 11/22/13 with EF 20-25%.   Hospitalized in12/16 for acute on chronic systolic HF. Was neg 5 L with diuresis- discharged with oxygen and to SNF for rehab.   Heart failure managed medically. Digoxin level of 1.8 was associated with nausea.   Hospitalized in 8/17 for presumed Lower GI bleed form diverticulosis. No colonoscopy was done.  ECHO in 2018 for branch retinal occlusion showed EF 35-40%.He is using [place] oxygen at night.  He was admitted in Jan 2019 with acute on chronic hypoxic resp failure. Hospital records show: "2D echo with a EF of 25-30%, global hypokinesis with akinesis of the inferior lateral wall. Overall severely reduced LV  function. Grade 1 diastolic dysfunction. Due to decreased ejection fraction and wall motion abnormalities cardiology was consulted. Patientunderwent Lexiscan Myoview on 02/03/2017 which showed no inducible ischemia, evidence of scar in the inferior distribution. Cardiology recommended patient be transitioned to home regimen of oral diuretics with salt and fluid restriction."    The patient does not have symptoms concerning for COVID-19 infection (fever, chills, cough, or new shortness of breath).    Past Medical History:  Diagnosis Date  . Atrial fibrillation ([place])   . CAD (coronary artery disease) 1996   status post PCI of the RCA   . Community acquired pneumonia 08/24/2015  . Congestive heart failure, unspecified   . COPD (chronic obstructive pulmonary disease) (HCC)    Pt on home O2 at night and PRN, unsure of date of diagnosis.  . Diabetes mellitus, type 2 ([place])   . DJD (degenerative joint disease)   . GERD (gastroesophageal reflux disease)   . Gout   . HTN (hypertension)   . 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 Nephrolithiasis   . Other primary cardiomyopathies   . Personal history of DVT (deep vein thrombosis)   . Prostatitis   . Shingles    Past Surgical History:  Procedure Laterality Date  . BACK SURGERY    . CARDIAC DEFIBRILLATOR PLACEMENT    . CORONARY STENT PLACEMENT    . PACEMAKER INSERTION       Current Meds  Medication Sig  . acetaminophen (TYLENOL) 500 MG tablet Take 500 mg by mouth  every 6 (six) hours as needed for mild pain or moderate pain.  Marland Kitchen albuterol (ACCUNEB) 1.25 MG/3ML nebulizer solution inhale THE contents of ONE vial EVERY 6 HOURS AS NEEDED  . albuterol (PROAIR HFA) 108 (90 Base) MCG/ACT inhaler Inhale 1-2 puffs into the lungs every 6 (six) hours as needed for wheezing or shortness of breath.  . alfuzosin (UROXATRAL) 10 MG 24 hr tablet Take 10 mg by mouth daily with breakfast.   . allopurinol (ZYLOPRIM) 100 MG tablet Take 1 tablet (100 mg  total) daily by mouth.  Marland Kitchen atorvastatin (LIPITOR) 10 MG tablet TAKE 1 TABLET BY MOUTH EVERY DAY  . carvedilol (COREG) 12.5 MG tablet Take 1 tablet (12.5 mg total) by mouth 2 (two) times daily with a meal.  . cetirizine (ZYRTEC) 10 MG tablet Take 10 mg by mouth daily.  . digoxin (LANOXIN) 0.125 MG tablet Take 0.5 tablets (0.0625 mg total) daily by mouth.  . fluticasone (FLONASE) 50 MCG/ACT nasal spray Place 2 sprays into both nostrils at bedtime as needed for allergies.  . furosemide (LASIX) 40 MG tablet Take 1.5 tablets (60 mg total) by mouth 2 (two) times daily.  . TRELEGY ELLIPTA 100-62.5-25 MCG/INH AEPB INHALE 1 PUFF BY MOUTH DAILY  . warfarin (COUMADIN) 1 MG tablet TAKE TWO TABLETS BY MOUTH EVERY DAY OR AS DIRECTED     Allergies:   Benadryl [diphenhydramine hcl] and Diphenhydramine   Social History   Tobacco Use  . Smoking status: Former Smoker    Packs/day: 4.00    Years: 50.00    Pack years: 200.00    Types: Cigarettes    Last attempt to quit: 01/22/1980    Years since quitting: 38.4  . Smokeless tobacco: Never Used  Substance Use Topics  . Alcohol use: No    Alcohol/week: 0.0 standard drinks    Comment: denies  . Drug use: No     Family Hx: The patient's family history includes Cancer in his father; Colon cancer in his mother; Heart disease in his father; Stroke in his paternal uncle. There is no history of Heart attack, Hyperlipidemia, or Hypertension.  ROS:   Please see the history of present illness.     All other systems reviewed and are negative.   Prior CV studies:   The following studies were reviewed today:  Labs reviewed  Labs/Other Tests and Data Reviewed:    EKG:  An ECG dated 02/2018 was personally reviewed today and demonstrated:  atrial sensed an V paced  Recent Labs: No results found for requested labs within last 8760 hours.   Recent Lipid Panel Lab Results  Component Value Date/Time   CHOL 135 09/19/2016 02:54 PM   TRIG 168 (H) 09/19/2016  02:54 PM   HDL 42 09/19/2016 02:54 PM   CHOLHDL 3.2 09/19/2016 02:54 PM   LDLCALC 59 09/19/2016 02:54 PM    Wt Readings from Last 3 Encounters:  06/22/18 180 lb (81.6 kg)  03/10/18 181 lb (82.1 kg)  12/22/17 185 lb (83.9 kg)     Objective:    Vital Signs:  Ht 5\' 6"  (1.676 m)   Wt 180 lb (81.6 kg)   BMI 29.05 kg/m    VITAL SIGNS:  reviewed GEN:  no acute distress RESPIRATORY:  no shortness of breath PSYCH:  normal affect exam limited  ASSESSMENT & PLAN:    1. Chronic systolic heart failure: Appears that he is euvolmic.  2. AICD: St. Jude BiV ICD.  He needs a device check.  He is  concerned that he may need a battery check. 3. PAF:  Warfarin for stroke prevention.  2.4 at last check.  4. CAD: No angina.  Continue secondary prevention. 5. CRI: 1.8 Creatinine. Avoid nephrotoxins.    COVID-19 Education: The signs and symptoms of COVID-19 were discussed with the patient and how to seek care for testing (follow up with PCP or arrange E-visit).  The importance of social distancing was discussed today.  Time:   Today, I have spent 15 minutes with the patient with telehealth technology discussing the above problems.     Medication Adjustments/Labs and Tests Ordered: Current medicines are reviewed at length with the patient today.  Concerns regarding medicines are outlined above.   Tests Ordered: No orders of the defined types were placed in this encounter.   Medication Changes: No orders of the defined types were placed in this encounter.   Disposition:  Follow up in 6 month(s)  Signed, Larae Grooms, MD  06/22/2018 3:34 PM    [place]

## 2018-07-20 ENCOUNTER — Telehealth: Payer: Self-pay | Admitting: Internal Medicine

## 2018-07-20 NOTE — Telephone Encounter (Signed)
New message:    Patient stating it has been 5 years since the doctor checked his Device and states he need a appt with doctor Lovena Le.

## 2018-07-21 DIAGNOSIS — J441 Chronic obstructive pulmonary disease with (acute) exacerbation: Secondary | ICD-10-CM | POA: Diagnosis not present

## 2018-07-24 ENCOUNTER — Other Ambulatory Visit: Payer: Self-pay | Admitting: Internal Medicine

## 2018-07-24 DIAGNOSIS — E782 Mixed hyperlipidemia: Secondary | ICD-10-CM

## 2018-07-24 DIAGNOSIS — Z8679 Personal history of other diseases of the circulatory system: Secondary | ICD-10-CM

## 2018-07-30 ENCOUNTER — Other Ambulatory Visit: Payer: Self-pay | Admitting: Emergency Medicine

## 2018-08-04 ENCOUNTER — Other Ambulatory Visit: Payer: Self-pay

## 2018-08-04 NOTE — Patient Outreach (Signed)
[place] [facility]) Care Management  08/04/2018  Troy [name] 1940/03/19 [id number]   Referral received from [place] team for complex case management.  Unsuccessful outreach attempt. Phone rang multiple times without option to leave a voice message.     PLAN -Will send unsuccessful outreach letter. -Will follow-up within 3-4 business days.  [place] [phone]

## 2018-08-07 ENCOUNTER — Telehealth: Payer: Self-pay | Admitting: Internal Medicine

## 2018-08-07 NOTE — Telephone Encounter (Signed)
Pt called 07-20-18 stating it had been 5 years since he has a device check and needed an appt with Lovena Le. He was seen 02-2018 so it has actually 5 months, and he is not due for his yearly check until 2-21. He did need a device check in May, and d/t COVID issues we did/ do not have a device clinic going to [place] at this time. I offered him to come to the device clinic in [place]. Pt will be a defib check with provider [phone]  on a Tues or Thursday.

## 2018-08-10 ENCOUNTER — Ambulatory Visit (INDEPENDENT_AMBULATORY_CARE_PROVIDER_SITE_OTHER): Payer: Medicare Other | Admitting: *Deleted

## 2018-08-10 ENCOUNTER — Other Ambulatory Visit: Payer: Self-pay

## 2018-08-10 DIAGNOSIS — Z5181 Encounter for therapeutic drug level monitoring: Secondary | ICD-10-CM | POA: Diagnosis not present

## 2018-08-10 DIAGNOSIS — I482 Chronic atrial fibrillation, unspecified: Secondary | ICD-10-CM | POA: Diagnosis not present

## 2018-08-10 LAB — POCT INR: INR: 2.3 (ref 2.0–3.0)

## 2018-08-10 NOTE — Patient Instructions (Signed)
Continue coumadin 2 tablets daily.  Recheck INR in 4 weeks

## 2018-08-10 NOTE — Patient Outreach (Signed)
[place] [facility]) Care Management  08/10/2018  Troy [name] 08-27-1940 [id number]    Unsuccessful outreach attempt. Left voice message requesting a return call.   PLAN -Will attempt outreach within 3-4 business days.    [place] [phone]

## 2018-08-11 ENCOUNTER — Ambulatory Visit: Payer: Medicare Other | Admitting: Emergency Medicine

## 2018-08-13 ENCOUNTER — Other Ambulatory Visit: Payer: Self-pay | Admitting: Emergency Medicine

## 2018-08-14 ENCOUNTER — Other Ambulatory Visit: Payer: Self-pay

## 2018-08-14 NOTE — Patient Outreach (Signed)
[place] [facility]) Care Management  08/14/2018  Troy [name] 1940-06-01 [id number]   Successful outreach with Mr. Lobello. HIPAA identifiers verified.   He was away from his home at the time of the call and unable to discuss his medical history. Discussed [facility] care management services.  He expressed interest in the [facility] program and agreed to outreach next week.   Denied urgent concerns or immediate care management needs. Encouraged to contact RNCM if needed prior to the scheduled outreach call.  PLAN -Will follow-up next week.  [place] [phone]

## 2018-08-18 ENCOUNTER — Other Ambulatory Visit: Payer: Self-pay

## 2018-08-18 ENCOUNTER — Other Ambulatory Visit: Payer: Self-pay | Admitting: Emergency Medicine

## 2018-08-18 NOTE — Patient Outreach (Signed)
[place] [facility]) Care Management  08/18/2018  Troy [name] Aug 27, 1940 [id number]    Attempted outreach to complete telephonic assessment. Unable to reach [name]. Will follow-up within 3-4 business days.    [place] [phone]

## 2018-08-21 DIAGNOSIS — J441 Chronic obstructive pulmonary disease with (acute) exacerbation: Secondary | ICD-10-CM | POA: Diagnosis not present

## 2018-08-24 ENCOUNTER — Other Ambulatory Visit: Payer: Self-pay

## 2018-08-25 ENCOUNTER — Ambulatory Visit: Payer: Medicare Other | Admitting: Emergency Medicine

## 2018-09-01 ENCOUNTER — Other Ambulatory Visit: Payer: Self-pay

## 2018-09-01 NOTE — Patient Outreach (Signed)
Troy [name]) Care Management  [place]  09/01/2018   Troy [name] Jul 15, 1940 [id number]  Subjective:  Outreach for completion of telephonic assessment.  Objective:  Encounter Medications:  Outpatient Encounter Medications as of 09/01/2018  Medication Sig  . albuterol (ACCUNEB) 1.25 MG/3ML nebulizer solution inhale THE contents of ONE vial EVERY 6 HOURS AS NEEDED  . allopurinol (ZYLOPRIM) 100 MG tablet Take 1 tablet (100 mg total) daily by mouth.  Marland Kitchen atorvastatin (LIPITOR) 10 MG tablet TAKE 1 TABLET BY MOUTH EVERY DAY  . carvedilol (COREG) 12.5 MG tablet Take 1 tablet (12.5 mg total) by mouth 2 (two) times daily with a meal.  . cetirizine (ZYRTEC) 10 MG tablet Take 10 mg by mouth daily.  . digoxin (LANOXIN) 0.125 MG tablet Take 0.5 tablets (0.0625 mg total) daily by mouth.  . fluticasone (FLONASE) 50 MCG/ACT nasal spray Place 2 sprays into both nostrils at bedtime as needed for allergies.  . furosemide (LASIX) 40 MG tablet Take 1.5 tablets (60 mg total) by mouth 2 (two) times daily.  Marland Kitchen PROAIR HFA 108 (90 Base) MCG/ACT inhaler INHALE 1-2 PUFFS INTO THE LUNGS EVERY 6 HOURS AS NEEDED FOR WHEEZING OR SHORTNESS OF BREATH  . TRELEGY ELLIPTA 100-62.5-25 MCG/INH AEPB INHALE 1 PUFF BY MOUTH EVERY DAY  . warfarin (COUMADIN) 1 MG tablet TAKE TWO TABLETS BY MOUTH EVERY DAY OR AS DIRECTED  . acetaminophen (TYLENOL) 500 MG tablet Take 500 mg by mouth every 6 (six) hours as needed for mild pain or moderate pain.  Marland Kitchen alfuzosin (UROXATRAL) 10 MG 24 hr tablet Take 10 mg by mouth daily with breakfast.    No facility-administered encounter medications on file as of 09/01/2018.     Functional Status:  In your present state of health, do you have any difficulty performing the following activities: 09/01/2018  Hearing? N  Vision? N  Difficulty concentrating or making decisions? N  Walking or climbing stairs? Y  Comment Climbing stairs  Dressing or bathing? N  Doing errands,  shopping? N  Preparing Food and eating ? N  Using the Toilet? N  In the past six months, have you accidently leaked urine? N  Do you have problems with loss of bowel control? N  Managing your Medications? N  Managing your Finances? N  Housekeeping or managing your Housekeeping? N  Some recent data might be hidden    Fall/Depression Screening: Fall Risk  09/01/2018 09/19/2016  Falls in the past year? 1 Yes  Number falls in past yr: 0 2 or more  Injury with Fall? 0 No  Risk Factor Category  - High Fall Risk  Risk for fall due to : Medication side effect;Impaired balance/gait History of fall(s);Impaired balance/gait;Impaired mobility  Follow up Falls prevention discussed -   Depression screen [facility] 2/9 09/01/2018 09/19/2016  Decreased Interest 0 0  Down, Depressed, Hopeless 0 0  PHQ - 2 Score 0 0  Some recent data might be hidden    Assessment:  Successful outreach with [name]. Telephonic assessment complete. He reports feeling well today. Denies complaints of pain, chest discomfort, or shortness of breath at the time of the call.   [name] reports taking medications as prescribed. Denies concerns regarding medication adherence or prescription costs. He does admit to not monitoring his weight and blood pressure as recommended. He is agreeable to monitoring his blood pressure and weighing at least once a week. He verbalizes understanding of parameters and indications for notifying his provider.  Discussed care  management needs. He denies urgent concerns. He reports living alone and has a very limited support system. Reports ambulating well and performing ADLs independently. Denies need for nutritional or transportation assistance. His activity tolerance is limited by COPD. States he will provide an update if assistance is needed in the home. THN CM Care Plan Problem One     Most Recent Value  Care Plan Problem One  Risk for Complications r/t Chronic Illnesses.  Role Documenting the  Problem One  Care Management Coordinator  Care Plan for Problem One  Active  THN Long Term Goal   Over the next 90 days, patient will not experience chronic disease related complications.  THN Long Term Goal Start Date  09/01/18  Interventions for Problem One Long Term Goal  Reviewed medications and indications for use. Provided education regarding recommendations for self-management. Discussed nutrition, activity tolerance and home safety.  THN CM Short Term Goal #1   Over the next 30 days, patient will complete recommended MD appointments.  THN CM Short Term Goal #1 Start Date  09/01/18  Interventions for Short Term Goal #1  Reviewed pending appointmentsm Patient reports several appts were rescheduled d/t COVID-19 restrictions. Encouraged to follow-up as recommended to prevent delays in care. [Denies current need for transportation assistance.]  THN CM Short Term Goal #2   Over the next 30 days, patient will take all medications as prescribed.  THN CM Short Term Goal #2 Start Date  09/01/18  Interventions for Short Term Goal #2  Reviewed medications and indications for use. Patient encouraged to take as prescribed and contact provider if unable to tolerate the prescribed regimen. [Denies concerns regarding prescription costs.]  THN CM Short Term Goal #3  Over the next 30 days, patient will weigh at least once a week and record readings.   THN CM Short Term Goal #3 Start Date  09/01/18  Interventions for Short Tern Goal #3  Provided education regarding CHF weight parameters. Patient prefers not to weigh daily. Encouraged to weigh at least once a week and report weight gain greater than 5lbs in a week.  THN CM Short Term Goal #4  Over the next 30 days, patient will check BP daily and record readings.   THN CM Short Term Goal #4 Start Date  09/01/18  Interventions for Short Term Goal #4  Discussed provider's recommendation to increase BP monitoring. Reviewed s/sx of hypotension and hypertension and  indications for seeking medical attention. Patient encouraged to monitor daily and record readings to identify trends.      PLAN -Will follow-up in two weeks.     [place] [phone]

## 2018-09-08 ENCOUNTER — Ambulatory Visit (INDEPENDENT_AMBULATORY_CARE_PROVIDER_SITE_OTHER): Payer: Medicare Other

## 2018-09-08 ENCOUNTER — Other Ambulatory Visit: Payer: Self-pay

## 2018-09-08 DIAGNOSIS — Z5181 Encounter for therapeutic drug level monitoring: Secondary | ICD-10-CM | POA: Diagnosis not present

## 2018-09-08 DIAGNOSIS — I482 Chronic atrial fibrillation, unspecified: Secondary | ICD-10-CM

## 2018-09-08 LAB — POCT INR: INR: 2.8 (ref 2.0–3.0)

## 2018-09-08 NOTE — Patient Instructions (Signed)
Description   Continue coumadin 2 tablets daily.  Recheck INR in 6 weeks

## 2018-09-10 DIAGNOSIS — J449 Chronic obstructive pulmonary disease, unspecified: Secondary | ICD-10-CM | POA: Diagnosis not present

## 2018-09-10 DIAGNOSIS — M109 Gout, unspecified: Secondary | ICD-10-CM | POA: Diagnosis not present

## 2018-09-10 DIAGNOSIS — I5020 Unspecified systolic (congestive) heart failure: Secondary | ICD-10-CM | POA: Diagnosis not present

## 2018-09-16 ENCOUNTER — Other Ambulatory Visit: Payer: Self-pay

## 2018-09-16 NOTE — Patient Outreach (Signed)
[place] [facility]) Care Management  09/16/2018  Troy [name] 04/23/1940 [id number]   [name] left a voice message after hours acknowledging receipt of the previous call. Attempted follow-up outreach today. Left a voice message requesting a return call when he is available.  PLAN -Will follow-up next week if call is not returned today.  [place] Care Management [phone]

## 2018-09-21 DIAGNOSIS — J441 Chronic obstructive pulmonary disease with (acute) exacerbation: Secondary | ICD-10-CM | POA: Diagnosis not present

## 2018-09-23 ENCOUNTER — Other Ambulatory Visit: Payer: Medicare Other

## 2018-10-06 ENCOUNTER — Other Ambulatory Visit: Payer: Self-pay

## 2018-10-06 ENCOUNTER — Ambulatory Visit: Payer: Medicare Other | Admitting: Emergency Medicine

## 2018-10-06 NOTE — Patient Outreach (Signed)
[facility]) Care Management  10/06/2018  GRADIE OHM 08/17/1940 [id number]    Call received from Mr. Zou acknowledging the previous voice mail. Attempted to reach Mr. Atteberry today. He was not available at the time of the call. Will attempt outreach next week.  [place] Care Management [phone]

## 2018-10-13 ENCOUNTER — Ambulatory Visit (INDEPENDENT_AMBULATORY_CARE_PROVIDER_SITE_OTHER): Payer: Medicare Other | Admitting: *Deleted

## 2018-10-13 ENCOUNTER — Other Ambulatory Visit: Payer: Self-pay

## 2018-10-13 DIAGNOSIS — I5022 Chronic systolic (congestive) heart failure: Secondary | ICD-10-CM | POA: Diagnosis not present

## 2018-10-13 LAB — CUP PACEART INCLINIC DEVICE CHECK
Date Time Interrogation Session: 20200922103309
Implantable Lead Implant Date: 20120801
Implantable Lead Implant Date: 20120801
Implantable Lead Implant Date: 20120801
Implantable Lead Location: 753858
Implantable Lead Location: 753859
Implantable Lead Location: 753860
Implantable Pulse Generator Implant Date: 20120801
Pulse Gen Serial Number: 7001284

## 2018-10-13 NOTE — Progress Notes (Signed)
Device check in clinic. See [place]

## 2018-10-15 ENCOUNTER — Ambulatory Visit: Payer: Medicare Other | Admitting: Specialist

## 2018-10-15 ENCOUNTER — Encounter: Payer: Self-pay | Admitting: Emergency Medicine

## 2018-10-15 ENCOUNTER — Other Ambulatory Visit: Payer: Self-pay

## 2018-10-15 ENCOUNTER — Ambulatory Visit (INDEPENDENT_AMBULATORY_CARE_PROVIDER_SITE_OTHER): Payer: Medicare Other | Admitting: Emergency Medicine

## 2018-10-15 DIAGNOSIS — J449 Chronic obstructive pulmonary disease, unspecified: Secondary | ICD-10-CM | POA: Diagnosis not present

## 2018-10-15 DIAGNOSIS — J301 Allergic rhinitis due to pollen: Secondary | ICD-10-CM | POA: Diagnosis not present

## 2018-10-15 NOTE — Assessment & Plan Note (Signed)
Overall stable. He uses albuterol nebs on a schedule. He likes the trelegy, but wonder if he is delivering effectively since he also uses the nebs. He is stable on the regimen. We can continue same for now.

## 2018-10-15 NOTE — Assessment & Plan Note (Signed)
Continue flonase and zyrtec

## 2018-10-15 NOTE — Progress Notes (Signed)
Virtual Visit via Telephone Note  I connected with Troy [name] on 10/15/18 at  3:15 PM EDT by telephone and verified that I am speaking with the correct person using two identifiers.  Location: Patient: Home Provider: Office   I discussed the limitations, risks, security and privacy concerns of performing an evaluation and management service by telephone and the availability of in person appointments. I also discussed with the patient that there may be a patient responsible charge related to this service. The patient expressed understanding and agreed to proceed.   History of Present Illness: 78 year old man with a history of heavy tobacco use and COPD, allergic rhinitis and cough.  He also has coronary disease and a dilated cardiomyopathy with AICD, atrial fibrillation, diabetes.  He has documented exertional hypoxemia.  Per review of notes he was treated for an acute exacerbation with cough in April.   Observations/Objective: Currently managed on Trelegy, albuterol nebs which he uses approximately.  He is also on Zyrtec, fluticasone nasal spray.  He reports today that he was treated with pred in April. He likes the trelegy - feels that it helps him. He is not having any dyspnea - he is able to do housework, is able to walk, does have to stop to rest occasionally. No cough. He does have nasal congestion - uses flonase qhs, zyrtec. He uses albuterol nebs tid. Has not had his flu shot yet.   Assessment and Plan: COPD (chronic obstructive pulmonary disease) ([place]) Overall stable. He uses albuterol nebs on a schedule. He likes the trelegy, but wonder if he is delivering effectively since he also uses the nebs. He is stable on the regimen. We can continue same for now.   Allergic rhinitis Continue flonase and zyrtec    Follow Up Instructions: 6 months  RN visit for flu shot next week   I discussed the assessment and treatment plan with the patient. The patient was provided an  opportunity to ask questions and all were answered. The patient agreed with the plan and demonstrated an understanding of the instructions.   The patient was advised to call back or seek an in-person evaluation if the symptoms worsen or if the condition fails to improve as anticipated.  I provided 20 minutes of non-face-to-face time during this encounter.   Collene Gobble, MD

## 2018-10-16 ENCOUNTER — Other Ambulatory Visit: Payer: Self-pay

## 2018-10-16 NOTE — Patient Outreach (Signed)
[name] [facility]) Care Management  10/16/2018  Troy [name] 1940-02-14 [id number]   Attempted outreach with [name]. Left HIPAA compliant voice message requesting a return call. Received incoming call from [name] but call dropped. Attempted to return call but continued to have difficulties with phone line.  PLAN -Will follow-up next week.  [place] Care Management [phone]

## 2018-10-20 ENCOUNTER — Ambulatory Visit (INDEPENDENT_AMBULATORY_CARE_PROVIDER_SITE_OTHER): Payer: Medicare Other | Admitting: Pharmacist

## 2018-10-20 ENCOUNTER — Telehealth: Payer: Self-pay | Admitting: Internal Medicine

## 2018-10-20 DIAGNOSIS — I482 Chronic atrial fibrillation, unspecified: Secondary | ICD-10-CM | POA: Diagnosis not present

## 2018-10-20 DIAGNOSIS — Z5181 Encounter for therapeutic drug level monitoring: Secondary | ICD-10-CM

## 2018-10-20 LAB — POCT INR: INR: 2.6 (ref 2.0–3.0)

## 2018-10-20 NOTE — Telephone Encounter (Signed)
error 

## 2018-10-20 NOTE — Patient Instructions (Signed)
Description   Continue coumadin 2 tablets daily.  Recheck INR in 6 weeks

## 2018-10-20 NOTE — Telephone Encounter (Signed)
Patient walked in for his CCR appointment. While Alma Friendly was trying to [place] and check temperature the patient refused to wear his mask and starting using profanity at [place] and refused to answer questions.   Jinny Blossom and myself went out into the lobby and tried to explain the need of wearing the mask and he kept telling us that he was sick of everyone trying to make him wear a mask, seeing it on the news and everywhere he goes.  He was told that he would have to leave and reschedule his appointment if he did not wear mask.    He refused to leave -stating that he was here and would be getting his CCR checked.      After several minutes we were able to get him to wear his mask long enough for Megan to check him.    He said that he will not be back

## 2018-10-21 DIAGNOSIS — J441 Chronic obstructive pulmonary disease with (acute) exacerbation: Secondary | ICD-10-CM | POA: Diagnosis not present

## 2018-10-22 ENCOUNTER — Other Ambulatory Visit: Payer: Self-pay

## 2018-10-22 NOTE — Patient Outreach (Signed)
[facility]) Care Management  10/22/2018  Troy [name] 02-18-1940 [id number]    Attempted follow-up outreach with [name]. Outreach unsuccessful. Will follow-up within 3-4 business days.   [place] Care Management [phone]

## 2018-10-26 ENCOUNTER — Other Ambulatory Visit: Payer: Self-pay | Admitting: Interventional Cardiology

## 2018-10-27 ENCOUNTER — Other Ambulatory Visit: Payer: Self-pay

## 2018-10-27 NOTE — Patient Outreach (Signed)
[place] [facility]) Care Management  10/27/2018  Troy [name] 07-05-1940 [id number]   2nd unsuccessful outreach attempt. Will mail unsuccessful outreach letter and follow-up within 3-4 business days.   [place] Care Management [phone]

## 2018-10-29 ENCOUNTER — Telehealth: Payer: Self-pay | Admitting: Pharmacist

## 2018-10-29 NOTE — Telephone Encounter (Signed)
Patient returned call. He would like to switch to Eliquis at next visit with lisa in Nov. He also mentioned that [name] wanted to get some blood work and asked if he could do it at that visit. Advised that he could get those labs drawn at the same time.

## 2018-10-29 NOTE — Telephone Encounter (Signed)
Called pt to discuss changing from warfarin to [place] due to better efficacy and safety data, as well as less frequent monitoring, especially given COVID-19 pandemic.  Left message for pt to discuss. He was irate about wearing a mask for his last INR check, changing to a DOAC would decrease his # of office visits substantially so he wouldn't have to worry about wearing his mask as much. He also has Medicaid insurance so a 3 month supply of Eliquis would cost $8.95.

## 2018-10-30 ENCOUNTER — Other Ambulatory Visit: Payer: Self-pay

## 2018-10-30 NOTE — Patient Outreach (Signed)
[place] [facility]) Care Management  10/30/2018  BYRD RUSHLOW Mar 03, 1940 [id number]   Mr. Sakai left a voice message acknowledging receipt of the previous call. Will attempt outreach next week.  [place] Care Management [phone]

## 2018-11-06 ENCOUNTER — Other Ambulatory Visit: Payer: Self-pay

## 2018-11-06 ENCOUNTER — Telehealth: Payer: Self-pay | Admitting: Emergency Medicine

## 2018-11-06 MED ORDER — ALBUTEROL SULFATE HFA 108 (90 BASE) MCG/ACT IN AERS: INHALATION_SPRAY | RESPIRATORY_TRACT | 2 refills | Status: DC

## 2018-11-06 NOTE — Patient Outreach (Signed)
[place] [facility]) Care Management  11/06/2018  BREAKER SPRINGER Dec 01, 1940 [id number]    Unsuccessful outreach attempt. Will attempt follow-up next week.   [place] Care Management [phone]

## 2018-11-06 NOTE — Telephone Encounter (Signed)
  Spoke with pt and advised rx for ProAir sent to [facility]. Nothing further is needed.     Assessment & Plan Note by Collene Gobble, MD at 10/15/2018 3:33 PM Author: Collene Gobble, MD Author Type: Physician Filed: 10/15/2018 3:33 PM  Note Status: Written Cosign: Cosign Not Required Encounter Date: 10/15/2018  Problem: Allergic rhinitis  Editor: Collene Gobble, MD (Physician)    Continue flonase and zyrtec    Assessment & Plan Note by Collene Gobble, MD at 10/15/2018 3:30 PM Author: Collene Gobble, MD Author Type: Physician Filed: 10/15/2018 3:33 PM  Note Status: Written Cosign: Cosign Not Required Encounter Date: 10/15/2018  Problem: COPD (chronic obstructive pulmonary disease) [facility])  Editor: Collene Gobble, MD (Physician)    Overall stable. He uses albuterol nebs on a schedule. He likes the trelegy, but wonder if he is delivering effectively since he also uses the nebs. He is stable on the regimen. We can continue same for now.

## 2018-11-19 ENCOUNTER — Other Ambulatory Visit: Payer: Self-pay

## 2018-11-20 ENCOUNTER — Other Ambulatory Visit: Payer: Self-pay

## 2018-11-20 NOTE — Patient Outreach (Signed)
This encounter was created in error - please disregard.

## 2018-11-21 DIAGNOSIS — J441 Chronic obstructive pulmonary disease with (acute) exacerbation: Secondary | ICD-10-CM | POA: Diagnosis not present

## 2018-11-22 ENCOUNTER — Other Ambulatory Visit: Payer: Self-pay | Admitting: Emergency Medicine

## 2018-11-30 ENCOUNTER — Other Ambulatory Visit: Payer: Self-pay | Admitting: Cardiology

## 2018-12-07 ENCOUNTER — Ambulatory Visit (INDEPENDENT_AMBULATORY_CARE_PROVIDER_SITE_OTHER): Payer: Medicare Other | Admitting: *Deleted

## 2018-12-07 ENCOUNTER — Other Ambulatory Visit: Payer: Self-pay

## 2018-12-07 DIAGNOSIS — I480 Paroxysmal atrial fibrillation: Secondary | ICD-10-CM | POA: Diagnosis not present

## 2018-12-07 DIAGNOSIS — Z5181 Encounter for therapeutic drug level monitoring: Secondary | ICD-10-CM | POA: Diagnosis not present

## 2018-12-07 DIAGNOSIS — I482 Chronic atrial fibrillation, unspecified: Secondary | ICD-10-CM

## 2018-12-07 LAB — POCT INR: INR: 2.9 (ref 2.0–3.0)

## 2018-12-07 NOTE — Patient Instructions (Addendum)
Continue coumadin 2 tablets daily.  Recheck INR in 6 weeks Pt does not want to change to Eliquis at this time.  Will think about it.

## 2018-12-09 ENCOUNTER — Telehealth: Payer: Self-pay

## 2018-12-09 ENCOUNTER — Ambulatory Visit (INDEPENDENT_AMBULATORY_CARE_PROVIDER_SITE_OTHER): Payer: Medicare Other | Admitting: Internal Medicine

## 2018-12-09 ENCOUNTER — Other Ambulatory Visit: Payer: Self-pay

## 2018-12-09 VITALS — HR 64 | Wt 179.4 lb

## 2018-12-09 DIAGNOSIS — Z95810 Presence of automatic (implantable) cardiac defibrillator: Secondary | ICD-10-CM | POA: Diagnosis not present

## 2018-12-09 DIAGNOSIS — I5022 Chronic systolic (congestive) heart failure: Secondary | ICD-10-CM | POA: Diagnosis not present

## 2018-12-09 DIAGNOSIS — I428 Other cardiomyopathies: Secondary | ICD-10-CM | POA: Diagnosis not present

## 2018-12-09 DIAGNOSIS — I429 Cardiomyopathy, unspecified: Secondary | ICD-10-CM

## 2018-12-09 LAB — CBC WITH DIFFERENTIAL/PLATELET
Basophils Absolute: 0 10*3/uL (ref 0.0–0.2)
Basos: 0 %
EOS (ABSOLUTE): 0.1 10*3/uL (ref 0.0–0.4)
Eos: 1 %
Hematocrit: 35.4 % — ABNORMAL LOW (ref 37.5–51.0)
Hemoglobin: 12.3 g/dL — ABNORMAL LOW (ref 13.0–17.7)
Lymphocytes Absolute: 1.7 10*3/uL (ref 0.7–3.1)
Lymphs: 19 %
MCH: 33.2 pg — ABNORMAL HIGH (ref 26.6–33.0)
MCHC: 34.7 g/dL (ref 31.5–35.7)
MCV: 96 fL (ref 79–97)
Monocytes Absolute: 0.8 10*3/uL (ref 0.1–0.9)
Monocytes: 9 %
Neutrophils Absolute: 6.4 10*3/uL (ref 1.4–7.0)
Neutrophils: 71 %
Platelets: 149 10*3/uL — ABNORMAL LOW (ref 150–450)
RBC: 3.7 x10E6/uL — ABNORMAL LOW (ref 4.14–5.80)
RDW: 15.2 % (ref 11.6–15.4)
WBC: 9 10*3/uL (ref 3.4–10.8)

## 2018-12-09 LAB — PROTIME-INR
INR: 2.5 — ABNORMAL HIGH (ref 0.9–1.2)
Prothrombin Time: 26.7 s — ABNORMAL HIGH (ref 9.1–12.0)

## 2018-12-09 LAB — BASIC METABOLIC PANEL
BUN/Creatinine Ratio: 12 (ref 10–24)
BUN: 17 mg/dL (ref 8–27)
CO2: 33 mmol/L — ABNORMAL HIGH (ref 20–29)
Calcium: 9.2 mg/dL (ref 8.6–10.2)
Chloride: 106 mmol/L (ref 96–106)
Creatinine, Ser: 1.38 mg/dL — ABNORMAL HIGH (ref 0.76–1.27)
GFR calc Af Amer: 56 mL/min/{1.73_m2} — ABNORMAL LOW (ref >59)
GFR calc non Af Amer: 49 mL/min/{1.73_m2} — ABNORMAL LOW (ref >59)
Glucose: 157 mg/dL — ABNORMAL HIGH (ref 65–99)
Potassium: 3.3 mmol/L — ABNORMAL LOW (ref 3.5–5.2)
Sodium: 141 mmol/L (ref 134–144)

## 2018-12-09 NOTE — Progress Notes (Signed)
HPI [name] returns today for followup. He has reached ERI. He denies chest pain or sob. No syncope or ICD shock. No edema.  Allergies  Allergen Reactions  . Benadryl [Diphenhydramine Hcl] Nausea And Vomiting  . Diphenhydramine Other (See Comments)     Current Outpatient Medications  Medication Sig Dispense Refill  . acetaminophen (TYLENOL) 500 MG tablet Take 500 mg by mouth every 6 (six) hours as needed for mild pain or moderate pain.    Marland Kitchen albuterol (ACCUNEB) 1.25 MG/3ML nebulizer solution inhale THE contents of ONE vial EVERY 6 HOURS AS NEEDED 360 mL 5  . albuterol (PROAIR HFA) 108 (90 Base) MCG/ACT inhaler INHALE 1-2 PUFFS INTO THE LUNGS EVERY 6 HOURS AS NEEDED FOR WHEEZING OR SHORTNESS OF BREATH 8.5 g 2  . alfuzosin (UROXATRAL) 10 MG 24 hr tablet Take 10 mg by mouth daily with breakfast.     . allopurinol (ZYLOPRIM) 100 MG tablet Take 1 tablet (100 mg total) daily by mouth. 90 tablet 0  . atorvastatin (LIPITOR) 10 MG tablet TAKE 1 TABLET BY MOUTH EVERY DAY 30 tablet 3  . carvedilol (COREG) 12.5 MG tablet Take 1 tablet (12.5 mg total) by mouth 2 (two) times daily with a meal. 60 tablet 6  . cetirizine (ZYRTEC) 10 MG tablet Take 10 mg by mouth daily.    . digoxin (LANOXIN) 0.125 MG tablet Take 0.5 tablets (0.0625 mg total) daily by mouth. 45 tablet 0  . fluticasone (FLONASE) 50 MCG/ACT nasal spray Place 2 sprays into both nostrils at bedtime as needed for allergies. 16 g 3  . furosemide (LASIX) 40 MG tablet TAKE 1 AND 1/2 TABLETS BY MOUTH TWICE DAILY 90 tablet 7  . TRELEGY ELLIPTA 100-62.5-25 MCG/INH AEPB INHALE 1 PUFF BY MOUTH EVERY DAY 60 each 5  . warfarin (COUMADIN) 1 MG tablet TAKE TWO TABLETS BY MOUTH EVERY DAY OR AS DIRECTED 65 tablet 2   No current facility-administered medications for this visit.      Past Medical History:  Diagnosis Date  . Atrial fibrillation ([place])   . CAD (coronary artery disease) 1996   status post PCI of the RCA   . Community acquired  pneumonia 08/24/2015  . Congestive heart failure, unspecified   . COPD (chronic obstructive pulmonary disease) (H[facility])    Pt on home O2 at night and PRN, unsure of date of diagnosis.  . Diabetes mellitus, type 2 ([place])   . DJD (degenerative joint disease)   . GERD (gastroesophageal reflux disease)   . Gout   . HTN (hypertension)   . 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 Nephrolithiasis   . Other primary cardiomyopathies   . Personal history of DVT (deep vein thrombosis)   . Prostatitis   . Shingles     ROS:   All systems reviewed and negative except as noted in the HPI.   Past Surgical History:  Procedure Laterality Date  . BACK SURGERY    . CARDIAC DEFIBRILLATOR PLACEMENT    . CORONARY STENT PLACEMENT    . PACEMAKER INSERTION       Family History  Problem Relation Age of Onset  . Heart disease Father   . Cancer Father        LUNG  . Colon cancer Mother   . Stroke Paternal Uncle   . Heart attack Neg Hx   . Hyperlipidemia Neg Hx   . Hypertension Neg Hx      Social History   Socioeconomic History  .  Marital status: Single    Spouse name: Not on file  . Number of children: 0  . Years of education: Not on file  . Highest education level: Not on file  Occupational History  . Occupation: Retired from [employer]  Social Needs  . Financial resource strain: Not on file  . Food insecurity    Worry: Not on file    Inability: Not on file  . Transportation needs    Medical: No    Non-medical: No  Tobacco Use  . Smoking status: Former Smoker    Packs/day: 4.00    Years: 50.00    Pack years: 200.00    Types: Cigarettes    Quit date: 01/22/1980    Years since quitting: 38.9  . Smokeless tobacco: Never Used  Substance and Sexual Activity  . Alcohol use: No    Alcohol/week: 0.0 standard drinks    Comment: denies  . Drug use: No  . Sexual activity: Never  Lifestyle  . Physical activity    Days per week: Not on file    Minutes per session: Not on file   . Stress: Not on file  Relationships  . Social Herbalist on phone: Not on file    Gets together: Not on file    Attends religious service: Not on file    Active member of club or organization: Not on file    Attends meetings of clubs or organizations: Not on file    Relationship status: Not on file  . Intimate partner violence    Fear of current or ex partner: Not on file    Emotionally abused: Not on file    Physically abused: Not on file    Forced sexual activity: Not on file  Other Topics Concern  . Not on file  Social History Narrative   Lives alone.  Single.  No children.  Ambulates with a cane.  Has a deceased brother and is estranged from sisters.     Pulse 64   Wt 179 lb 6.4 oz (81.4 kg)   SpO2 92%   BMI 28.96 kg/m   Physical Exam:  Well appearing NAD HEENT: Unremarkable Neck:  No JVD, no thyromegally Lymphatics:  No adenopathy Back:  No CVA tenderness Lungs:  Clear with no wheezes HEART:  Regular rate rhythm, no murmurs, no rubs, no clicks Abd:  soft, positive bowel sounds, no organomegally, no rebound, no guarding Ext:  2 plus pulses, no edema, no cyanosis, no clubbing Skin:  No rashes no nodules Neuro:  CN II through XII intact, motor grossly intact  DEVICE  Normal device function.  See PaceArt for details. ERI  Assess/Plan: 1. ICD - his St. Jude biv ICD has reached ERI. I have discussed the indications/risks/benefits/goals/expectations of ICD generator change out and he wishes to proceed. 2. Chronic systolic heart failure- his symptoms are class 2. He will continue his current meds.  Mikle Bosworth.D.

## 2018-12-09 NOTE — Patient Instructions (Signed)
Medication Instructions:  Your physician recommends that you continue on your current medications as directed. Please refer to the Current Medication list given to you today.  Labwork: You will get lab work today:  BMP, CBC and PT/INR  Testing/Procedures: None ordered.  Follow-Up:  See instruction letter  Any Other Special Instructions Will Be Listed Below (If Applicable).  If you need a refill on your cardiac medications before your next appointment, please call your pharmacy.

## 2018-12-09 NOTE — H&P (View-Only) (Signed)
HPI Troy [name] returns today for followup. He has reached ERI. He denies chest pain or sob. No syncope or ICD shock. No edema.  Allergies  Allergen Reactions  . Benadryl [Diphenhydramine Hcl] Nausea And Vomiting  . Diphenhydramine Other (See Comments)     Current Outpatient Medications  Medication Sig Dispense Refill  . acetaminophen (TYLENOL) 500 MG tablet Take 500 mg by mouth every 6 (six) hours as needed for mild pain or moderate pain.    Marland Kitchen albuterol (ACCUNEB) 1.25 MG/3ML nebulizer solution inhale THE contents of ONE vial EVERY 6 HOURS AS NEEDED 360 mL 5  . albuterol (PROAIR HFA) 108 (90 Base) MCG/ACT inhaler INHALE 1-2 PUFFS INTO THE LUNGS EVERY 6 HOURS AS NEEDED FOR WHEEZING OR SHORTNESS OF BREATH 8.5 g 2  . alfuzosin (UROXATRAL) 10 MG 24 hr tablet Take 10 mg by mouth daily with breakfast.     . allopurinol (ZYLOPRIM) 100 MG tablet Take 1 tablet (100 mg total) daily by mouth. 90 tablet 0  . atorvastatin (LIPITOR) 10 MG tablet TAKE 1 TABLET BY MOUTH EVERY DAY 30 tablet 3  . carvedilol (COREG) 12.5 MG tablet Take 1 tablet (12.5 mg total) by mouth 2 (two) times daily with a meal. 60 tablet 6  . cetirizine (ZYRTEC) 10 MG tablet Take 10 mg by mouth daily.    . digoxin (LANOXIN) 0.125 MG tablet Take 0.5 tablets (0.0625 mg total) daily by mouth. 45 tablet 0  . fluticasone (FLONASE) 50 MCG/ACT nasal spray Place 2 sprays into both nostrils at bedtime as needed for allergies. 16 g 3  . furosemide (LASIX) 40 MG tablet TAKE 1 AND 1/2 TABLETS BY MOUTH TWICE DAILY 90 tablet 7  . TRELEGY ELLIPTA 100-62.5-25 MCG/INH AEPB INHALE 1 PUFF BY MOUTH EVERY DAY 60 each 5  . warfarin (COUMADIN) 1 MG tablet TAKE TWO TABLETS BY MOUTH EVERY DAY OR AS DIRECTED 65 tablet 2   No current facility-administered medications for this visit.      Past Medical History:  Diagnosis Date  . Atrial fibrillation ([place])   . CAD (coronary artery disease) 1996   status post PCI of the RCA   . Community acquired  pneumonia 08/24/2015  . Congestive heart failure, unspecified   . COPD (chronic obstructive pulmonary disease) (HCC)    Pt on home O2 at night and PRN, unsure of date of diagnosis.  . Diabetes mellitus, type 2 ([place])   . DJD (degenerative joint disease)   . GERD (gastroesophageal reflux disease)   . Gout   . HTN (hypertension)   . 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 Nephrolithiasis   . Other primary cardiomyopathies   . Personal history of DVT (deep vein thrombosis)   . Prostatitis   . Shingles     ROS:   All systems reviewed and negative except as noted in the HPI.   Past Surgical History:  Procedure Laterality Date  . BACK SURGERY    . CARDIAC DEFIBRILLATOR PLACEMENT    . CORONARY STENT PLACEMENT    . PACEMAKER INSERTION       Family History  Problem Relation Age of Onset  . Heart disease Father   . Cancer Father        LUNG  . Colon cancer Mother   . Stroke Paternal Uncle   . Heart attack Neg Hx   . Hyperlipidemia Neg Hx   . Hypertension Neg Hx      Social History   Socioeconomic History  .  Marital status: Single    Spouse name: Not on file  . Number of children: 0  . Years of education: Not on file  . Highest education level: Not on file  Occupational History  . Occupation: Retired from [employer]  Social Needs  . Financial resource strain: Not on file  . Food insecurity    Worry: Not on file    Inability: Not on file  . Transportation needs    Medical: No    Non-medical: No  Tobacco Use  . Smoking status: Former Smoker    Packs/day: 4.00    Years: 50.00    Pack years: 200.00    Types: Cigarettes    Quit date: 01/22/1980    Years since quitting: 38.9  . Smokeless tobacco: Never Used  Substance and Sexual Activity  . Alcohol use: No    Alcohol/week: 0.0 standard drinks    Comment: denies  . Drug use: No  . Sexual activity: Never  Lifestyle  . Physical activity    Days per week: Not on file    Minutes per session: Not on file   . Stress: Not on file  Relationships  . Social Herbalist on phone: Not on file    Gets together: Not on file    Attends religious service: Not on file    Active member of club or organization: Not on file    Attends meetings of clubs or organizations: Not on file    Relationship status: Not on file  . Intimate partner violence    Fear of current or ex partner: Not on file    Emotionally abused: Not on file    Physically abused: Not on file    Forced sexual activity: Not on file  Other Topics Concern  . Not on file  Social History Narrative   Lives alone.  Single.  No children.  Ambulates with a cane.  Has a deceased brother and is estranged from sisters.     Pulse 64   Wt 179 lb 6.4 oz (81.4 kg)   SpO2 92%   BMI 28.96 kg/m   Physical Exam:  Well appearing NAD HEENT: Unremarkable Neck:  No JVD, no thyromegally Lymphatics:  No adenopathy Back:  No CVA tenderness Lungs:  Clear with no wheezes HEART:  Regular rate rhythm, no murmurs, no rubs, no clicks Abd:  soft, positive bowel sounds, no organomegally, no rebound, no guarding Ext:  2 plus pulses, no edema, no cyanosis, no clubbing Skin:  No rashes no nodules Neuro:  CN II through XII intact, motor grossly intact  DEVICE  Normal device function.  See PaceArt for details. ERI  Assess/Plan: 1. ICD - his St. Jude biv ICD has reached ERI. I have discussed the indications/risks/benefits/goals/expectations of ICD generator change out and he wishes to proceed. 2. Chronic systolic heart failure- his symptoms are class 2. He will continue his current meds.  Mikle Bosworth.D.

## 2018-12-09 NOTE — Telephone Encounter (Signed)
The pt ICD vibrated "buzz". It did it twice. I asked the pt to come in at 3 pm but whenever he get here is fine. The pt agreed to come in.

## 2018-12-10 LAB — CUP PACEART INCLINIC DEVICE CHECK
Battery Remaining Longevity: 0 mo
Brady Statistic RA Percent Paced: 75 %
Brady Statistic RV Percent Paced: 95 %
Date Time Interrogation Session: 20201118183216
HighPow Impedance: 44 Ohm
Implantable Lead Implant Date: 20120801
Implantable Lead Implant Date: 20120801
Implantable Lead Implant Date: 20120801
Implantable Lead Location: 753858
Implantable Lead Location: 753859
Implantable Lead Location: 753860
Implantable Pulse Generator Implant Date: 20120801
Lead Channel Impedance Value: 400 Ohm
Lead Channel Impedance Value: 525 Ohm
Lead Channel Impedance Value: 825 Ohm
Lead Channel Pacing Threshold Amplitude: 0.75 V
Lead Channel Pacing Threshold Amplitude: 0.875 V
Lead Channel Pacing Threshold Amplitude: 1 V
Lead Channel Pacing Threshold Pulse Width: 0.5 ms
Lead Channel Pacing Threshold Pulse Width: 0.5 ms
Lead Channel Pacing Threshold Pulse Width: 0.5 ms
Lead Channel Sensing Intrinsic Amplitude: 12 mV
Lead Channel Sensing Intrinsic Amplitude: 5 mV
Lead Channel Setting Pacing Amplitude: 2 V
Lead Channel Setting Pacing Amplitude: 2 V
Lead Channel Setting Pacing Amplitude: 2 V
Lead Channel Setting Pacing Pulse Width: 0.5 ms
Lead Channel Setting Pacing Pulse Width: 0.5 ms
Lead Channel Setting Sensing Sensitivity: 0.5 mV
Pulse Gen Serial Number: 7001284

## 2018-12-11 ENCOUNTER — Other Ambulatory Visit (HOSPITAL_COMMUNITY)
Admission: RE | Admit: 2018-12-11 | Discharge: 2018-12-11 | Disposition: A | Discharge status: Home / Self Care | Payer: Medicare Other | Source: Ambulatory Visit | Attending: Internal Medicine | Admitting: Internal Medicine

## 2018-12-11 ENCOUNTER — Other Ambulatory Visit: Payer: Self-pay

## 2018-12-11 DIAGNOSIS — Z20828 Contact with and (suspected) exposure to other viral communicable diseases: Secondary | ICD-10-CM | POA: Insufficient documentation

## 2018-12-11 DIAGNOSIS — Z01812 Encounter for preprocedural laboratory examination: Secondary | ICD-10-CM | POA: Diagnosis not present

## 2018-12-11 LAB — SARS CORONAVIRUS 2 (TAT 6-24 HRS): SARS Coronavirus 2: NEGATIVE

## 2018-12-14 ENCOUNTER — Other Ambulatory Visit: Payer: Self-pay

## 2018-12-14 ENCOUNTER — Ambulatory Visit (HOSPITAL_COMMUNITY)
Admission: RE | Admit: 2018-12-14 | Discharge: 2018-12-14 | Disposition: A | Discharge status: Home / Self Care | Payer: Medicare Other | Attending: Internal Medicine | Admitting: Internal Medicine

## 2018-12-14 ENCOUNTER — Encounter (HOSPITAL_COMMUNITY)
Admission: RE | Disposition: A | Discharge status: Home / Self Care | Payer: Self-pay | Source: Home / Self Care | Attending: Internal Medicine

## 2018-12-14 DIAGNOSIS — I4891 Unspecified atrial fibrillation: Secondary | ICD-10-CM | POA: Diagnosis not present

## 2018-12-14 DIAGNOSIS — E119 Type 2 diabetes mellitus without complications: Secondary | ICD-10-CM | POA: Diagnosis not present

## 2018-12-14 DIAGNOSIS — Z888 Allergy status to other drugs, medicaments and biological substances status: Secondary | ICD-10-CM | POA: Diagnosis not present

## 2018-12-14 DIAGNOSIS — Z95810 Presence of automatic (implantable) cardiac defibrillator: Secondary | ICD-10-CM | POA: Diagnosis not present

## 2018-12-14 DIAGNOSIS — Z955 Presence of coronary angioplasty implant and graft: Secondary | ICD-10-CM | POA: Insufficient documentation

## 2018-12-14 DIAGNOSIS — K219 Gastro-esophageal reflux disease without esophagitis: Secondary | ICD-10-CM | POA: Insufficient documentation

## 2018-12-14 DIAGNOSIS — Z86718 Personal history of other venous thrombosis and embolism: Secondary | ICD-10-CM | POA: Insufficient documentation

## 2018-12-14 DIAGNOSIS — I110 Hypertensive heart disease with heart failure: Secondary | ICD-10-CM | POA: Insufficient documentation

## 2018-12-14 DIAGNOSIS — Z79899 Other long term (current) drug therapy: Secondary | ICD-10-CM | POA: Insufficient documentation

## 2018-12-14 DIAGNOSIS — Z45010 Encounter for checking and testing of cardiac pacemaker pulse generator [battery]: Secondary | ICD-10-CM | POA: Diagnosis not present

## 2018-12-14 DIAGNOSIS — M1990 Unspecified osteoarthritis, unspecified site: Secondary | ICD-10-CM | POA: Insufficient documentation

## 2018-12-14 DIAGNOSIS — I420 Dilated cardiomyopathy: Secondary | ICD-10-CM | POA: Diagnosis not present

## 2018-12-14 DIAGNOSIS — I5022 Chronic systolic (congestive) heart failure: Secondary | ICD-10-CM

## 2018-12-14 DIAGNOSIS — Z7901 Long term (current) use of anticoagulants: Secondary | ICD-10-CM | POA: Insufficient documentation

## 2018-12-14 DIAGNOSIS — J449 Chronic obstructive pulmonary disease, unspecified: Secondary | ICD-10-CM | POA: Diagnosis not present

## 2018-12-14 DIAGNOSIS — I255 Ischemic cardiomyopathy: Secondary | ICD-10-CM | POA: Insufficient documentation

## 2018-12-14 DIAGNOSIS — Z87891 Personal history of nicotine dependence: Secondary | ICD-10-CM | POA: Diagnosis not present

## 2018-12-14 DIAGNOSIS — M109 Gout, unspecified: Secondary | ICD-10-CM | POA: Diagnosis not present

## 2018-12-14 DIAGNOSIS — Z8249 Family history of ischemic heart disease and other diseases of the circulatory system: Secondary | ICD-10-CM | POA: Insufficient documentation

## 2018-12-14 DIAGNOSIS — Z4502 Encounter for adjustment and management of automatic implantable cardiac defibrillator: Secondary | ICD-10-CM

## 2018-12-14 DIAGNOSIS — I2510 Atherosclerotic heart disease of native coronary artery without angina pectoris: Secondary | ICD-10-CM | POA: Insufficient documentation

## 2018-12-14 HISTORY — PX: BIV ICD GENERATOR CHANGEOUT: EP1194

## 2018-12-14 LAB — POTASSIUM: Potassium: 3.1 mmol/L — ABNORMAL LOW (ref 3.5–5.1)

## 2018-12-14 LAB — GLUCOSE, CAPILLARY: Glucose-Capillary: 125 mg/dL — ABNORMAL HIGH (ref 70–99)

## 2018-12-14 LAB — PROTIME-INR
INR: 2.1 — ABNORMAL HIGH (ref 0.8–1.2)
Prothrombin Time: 23.7 seconds — ABNORMAL HIGH (ref 11.4–15.2)

## 2018-12-14 LAB — SURGICAL PCR SCREEN
MRSA, PCR: NEGATIVE
Staphylococcus aureus: NEGATIVE

## 2018-12-14 SURGERY — BIV ICD GENERATOR CHANGEOUT

## 2018-12-14 MED ORDER — CEFAZOLIN SODIUM-DEXTROSE 2-4 GM/100ML-% IV SOLN: 2.0000 g | INTRAVENOUS | Status: DC

## 2018-12-14 MED ORDER — CEFAZOLIN SODIUM-DEXTROSE 2-4 GM/100ML-% IV SOLN
INTRAVENOUS | Status: AC
  Filled 2018-12-14: qty 100

## 2018-12-14 MED ORDER — POTASSIUM CHLORIDE CRYS ER 20 MEQ PO TBCR
EXTENDED_RELEASE_TABLET | ORAL | Status: AC
  Filled 2018-12-14: qty 2

## 2018-12-14 MED ORDER — CEFAZOLIN SODIUM-DEXTROSE 2-3 GM-%(50ML) IV SOLR
INTRAVENOUS | Status: AC | PRN
  Administered 2018-12-14: 2 g via INTRAVENOUS

## 2018-12-14 MED ORDER — LIDOCAINE HCL (PF) 1 % IJ SOLN
INTRAMUSCULAR | Status: DC | PRN
  Administered 2018-12-14: 60 mL

## 2018-12-14 MED ORDER — LIDOCAINE HCL 1 % IJ SOLN
INTRAMUSCULAR | Status: AC
  Filled 2018-12-14: qty 60

## 2018-12-14 MED ORDER — SODIUM CHLORIDE 0.9 % IV SOLN
80.0000 mg | INTRAVENOUS | Status: AC
  Administered 2018-12-14: 80 mg

## 2018-12-14 MED ORDER — ACETAMINOPHEN 325 MG PO TABS
325.0000 mg | ORAL_TABLET | ORAL | Status: DC | PRN
  Filled 2018-12-14: qty 2

## 2018-12-14 MED ORDER — ONDANSETRON HCL 4 MG/2ML IJ SOLN: 4.0000 mg | Freq: Four times a day (QID) | INTRAMUSCULAR | Status: DC | PRN

## 2018-12-14 MED ORDER — HEPARIN (PORCINE) IN NACL 1000-0.9 UT/500ML-% IV SOLN
INTRAVENOUS | Status: AC
  Filled 2018-12-14: qty 500

## 2018-12-14 MED ORDER — SODIUM CHLORIDE 0.9 % IV SOLN
INTRAVENOUS | Status: DC
  Administered 2018-12-14: 10:00:00 via INTRAVENOUS

## 2018-12-14 MED ORDER — SODIUM CHLORIDE 0.9 % IV SOLN
INTRAVENOUS | Status: AC
  Filled 2018-12-14: qty 2

## 2018-12-14 MED ORDER — POTASSIUM CHLORIDE CRYS ER 20 MEQ PO TBCR
40.0000 meq | EXTENDED_RELEASE_TABLET | Freq: Once | ORAL | Status: AC
  Administered 2018-12-14: 40 meq via ORAL
  Filled 2018-12-14: qty 2

## 2018-12-14 MED ORDER — MUPIROCIN 2 % EX OINT
1.0000 "application " | TOPICAL_OINTMENT | Freq: Once | CUTANEOUS | Status: AC
  Administered 2018-12-14: 10:00:00 1 via TOPICAL

## 2018-12-14 MED ORDER — FENTANYL CITRATE (PF) 100 MCG/2ML IJ SOLN
INTRAMUSCULAR | Status: AC
  Filled 2018-12-14: qty 2

## 2018-12-14 MED ORDER — MUPIROCIN 2 % EX OINT
TOPICAL_OINTMENT | CUTANEOUS | Status: AC
  Administered 2018-12-14: 1 via TOPICAL
  Filled 2018-12-14: qty 22

## 2018-12-14 MED ORDER — CHLORHEXIDINE GLUCONATE 4 % EX LIQD: 4.0000 "application " | Freq: Once | CUTANEOUS | Status: DC

## 2018-12-14 MED ORDER — MIDAZOLAM HCL 5 MG/5ML IJ SOLN
INTRAMUSCULAR | Status: AC
  Filled 2018-12-14: qty 5

## 2018-12-14 SURGICAL SUPPLY — 4 items
CABLE SURGICAL S-101-97-12 (CABLE) ×3 IMPLANT
ICD GALLANT HFCRTD CDHFA500Q (ICD Generator) ×2 IMPLANT
PAD PRO RADIOLUCENT 2001M-C (PAD) ×3 IMPLANT
TRAY PACEMAKER INSERTION (PACKS) ×3 IMPLANT

## 2018-12-14 NOTE — Interval H&P Note (Signed)
History and Physical Interval Note:  12/14/2018 1:05 PM  Troy [name]  has presented today for surgery, with the diagnosis of ERI.  The various methods of treatment have been discussed with the patient and family. After consideration of risks, benefits and other options for treatment, the patient has consented to  Procedure(s): BIV ICD [place] (N/A) as a surgical intervention.  The patient's history has been reviewed, patient examined, no change in status, stable for surgery.  I have reviewed the patient's chart and labs.  Questions were answered to the patient's satisfaction.     Cristopher Peru

## 2018-12-14 NOTE — Discharge Instructions (Signed)
Keep incision clean and dry for 10 days. No driving for 2 days.  You can remove outer dressing tomorrow. Leave steri-strips (little pieces of tape) on until seen in the office for wound check appointment. Call the office [phone]) for redness, drainage, swelling, or fever.    Pacemaker Battery Change, Care After This sheet gives you information about how to care for yourself after your procedure. Your health care provider may also give you more specific instructions. If you have problems or questions, contact your health care provider. What can I expect after the procedure? After your procedure, it is common to have:  Pain or soreness at the site where the pacemaker was inserted.  Swelling at the site where the pacemaker was inserted. Follow these instructions at home: Incision care   Keep the incision clean and dry. ? Do not take baths, swim, or use a hot tub until your health care provider approves. ? You may shower the day after your procedure, or as directed by your health care provider. ? Pat the area dry with a clean towel. Do not rub the area. This may cause bleeding.  Follow instructions from your health care provider about how to take care of your incision. Make sure you: ? Wash your hands with soap and water before you change your bandage (dressing). If soap and water are not available, use hand sanitizer. ? Change your dressing as told by your health care provider. ? Leave stitches (sutures), skin glue, or adhesive strips in place. These skin closures may need to stay in place for 2 weeks or longer. If adhesive strip edges start to loosen and curl up, you may trim the loose edges. Do not remove adhesive strips completely unless your health care provider tells you to do that.  Check your incision area every day for signs of infection. Check for: ? More redness, swelling, or pain. ? More fluid or blood. ? Warmth. ? Pus or a bad smell. Activity  Do not lift anything that  is heavier than 10 lb (4.5 kg) until your health care provider says it is okay to do so.  For the first 2 weeks, or as long as told by your health care provider: ? Avoid lifting your left arm higher than your shoulder. ? Be gentle when you move your arms over your head. It is okay to raise your arm to comb your hair. ? Avoid strenuous exercise.  Ask your health care provider when it is okay to: ? Resume your normal activities. ? Return to work or school. ? Resume sexual activity. Eating and drinking  Eat a heart-healthy diet. This should include plenty of fresh fruits and vegetables, whole grains, low-fat dairy products, and lean protein like chicken and fish.  Limit alcohol intake to no more than 1 drink a day for non-pregnant women and 2 drinks a day for men. One drink equals 12 oz of beer, 5 oz of wine, or 1 oz of hard liquor.  Check ingredients and nutrition facts on packaged foods and beverages. Avoid the following types of food: ? Food that is high in salt (sodium). ? Food that is high in saturated fat, like full-fat dairy or red meat. ? Food that is high in trans fat, like fried food. ? Food and drinks that are high in sugar. Lifestyle  Do not use any products that contain nicotine or tobacco, such as cigarettes and e-cigarettes. If you need help quitting, ask your health care provider.  Take steps to  manage and control your weight.  Get regular exercise. Aim for 150 minutes of moderate-intensity exercise (such as walking or yoga) or 75 minutes of vigorous exercise (such as running or swimming) each week.  Manage other health problems, such as diabetes or high blood pressure. Ask your health care provider how you can manage these conditions. General instructions  Do not drive for 24 hours after your procedure if you were given a medicine to help you relax (sedative).  Take over-the-counter and prescription medicines only as told by your health care provider.  Avoid  putting pressure on the area where the pacemaker was placed.  If you need an MRI after your pacemaker has been placed, be sure to tell the health care provider who orders the MRI that you have a pacemaker.  Avoid close and prolonged exposure to electrical devices that have strong magnetic fields. These include: ? Cell phones. Avoid keeping them in a pocket near the pacemaker, and try using the ear opposite the pacemaker. ? MP3 players. ? Household appliances, like microwaves. ? Metal detectors. ? Electric generators. ? High-tension wires.  Keep all follow-up visits as directed by your health care provider. This is important. Contact a health care provider if:  You have pain at the incision site that is not relieved by over-the-counter or prescription medicines.  You have any of these around your incision site or coming from it: ? More redness, swelling, or pain. ? Fluid or blood. ? Warmth to the touch. ? Pus or a bad smell.  You have a fever.  You feel brief, occasional palpitations, light-headedness, or any symptoms that you think might be related to your heart. Get help right away if:  You experience chest pain that is different from the pain at the pacemaker site.  You develop a red streak that extends above or below the incision site.  You experience shortness of breath.  You have palpitations or an irregular heartbeat.  You have light-headedness that does not go away quickly.  You faint or have dizzy spells.  Your pulse suddenly drops or increases rapidly and does not return to normal.  You begin to gain weight and your legs and ankles swell. Summary  After your procedure, it is common to have pain, soreness, and some swelling where the pacemaker was inserted.  Make sure to keep your incision clean and dry. Follow instructions from your health care provider about how to take care of your incision.  Check your incision every day for signs of infection, such as  more pain or swelling, pus or a bad smell, warmth, or leaking fluid and blood.  Avoid strenuous exercise and lifting your left arm higher than your shoulder for 2 weeks, or as long as told by your health care provider. This information is not intended to replace advice given to you by your health care provider. Make sure you discuss any questions you have with your health care provider. Document Released: 10/28/2012 Document Revised: 12/20/2016 Document Reviewed: 11/30/2015 Elsevier Patient Education  2020 [employer].

## 2018-12-15 ENCOUNTER — Encounter (HOSPITAL_COMMUNITY): Payer: Self-pay | Admitting: Internal Medicine

## 2018-12-15 MED ORDER — POTASSIUM CHLORIDE CRYS ER 20 MEQ PO TBCR: 30.0000 meq | EXTENDED_RELEASE_TABLET | Freq: Every day | ORAL | 3 refills | Status: DC

## 2018-12-15 MED FILL — Lidocaine HCl Local Inj 1%: INTRAMUSCULAR | Qty: 60 | Status: AC

## 2018-12-15 MED FILL — Midazolam HCl Inj 2 MG/2ML (Base Equivalent): INTRAMUSCULAR | Qty: 2 | Status: AC

## 2018-12-15 MED FILL — Fentanyl Citrate Preservative Free (PF) Inj 100 MCG/2ML: INTRAMUSCULAR | Qty: 2 | Status: AC

## 2018-12-15 NOTE — Telephone Encounter (Signed)
Attempted outreach to Pt.  Per RU-Start Pt on potassium 30 meq daily for low potassium  Repeat BMP at wound check  Order placed and sent to Pt pharmacy  Pt not answering phone, per DPR do NOT leave message  Pt in wound check notes need for BMP

## 2018-12-15 NOTE — Telephone Encounter (Signed)
error 

## 2018-12-16 ENCOUNTER — Telehealth: Payer: Self-pay

## 2018-12-16 ENCOUNTER — Telehealth: Payer: Self-pay | Admitting: Physician Assistant

## 2018-12-16 NOTE — Telephone Encounter (Signed)
Page received that patient "needs fluid pills." I have attempted to reach the patient three times without answer. I left a VM to return call to the answering service.

## 2018-12-16 NOTE — Telephone Encounter (Signed)
Attempted outreach to Pt.  Per RU-Start Pt on potassium 30 meq daily for low potassium  Repeat BMP at wound check  Order placed and sent to Pt pharmacy  Pt not answering phone, per DPR do NOT leave message  Pt in wound check notes need for BMP

## 2018-12-16 NOTE — Telephone Encounter (Signed)
LMTCB

## 2018-12-21 DIAGNOSIS — J441 Chronic obstructive pulmonary disease with (acute) exacerbation: Secondary | ICD-10-CM | POA: Diagnosis not present

## 2018-12-22 ENCOUNTER — Encounter: Payer: Medicare Other | Admitting: Internal Medicine

## 2018-12-23 DIAGNOSIS — M109 Gout, unspecified: Secondary | ICD-10-CM | POA: Diagnosis not present

## 2018-12-23 DIAGNOSIS — I5020 Unspecified systolic (congestive) heart failure: Secondary | ICD-10-CM | POA: Diagnosis not present

## 2018-12-23 DIAGNOSIS — J449 Chronic obstructive pulmonary disease, unspecified: Secondary | ICD-10-CM | POA: Diagnosis not present

## 2018-12-23 DIAGNOSIS — Z0000 Encounter for general adult medical examination without abnormal findings: Secondary | ICD-10-CM | POA: Diagnosis not present

## 2018-12-23 DIAGNOSIS — Z1389 Encounter for screening for other disorder: Secondary | ICD-10-CM | POA: Diagnosis not present

## 2018-12-23 NOTE — Telephone Encounter (Signed)
LMTCB

## 2018-12-24 ENCOUNTER — Telehealth: Payer: Self-pay | Admitting: Internal Medicine

## 2018-12-24 NOTE — Telephone Encounter (Signed)
New message:     Patient states that some one called him. I didn't see a note. Please call patient.

## 2018-12-25 ENCOUNTER — Other Ambulatory Visit: Payer: Self-pay

## 2018-12-25 DIAGNOSIS — Z79899 Other long term (current) drug therapy: Secondary | ICD-10-CM

## 2018-12-25 DIAGNOSIS — E876 Hypokalemia: Secondary | ICD-10-CM

## 2018-12-25 DIAGNOSIS — I5022 Chronic systolic (congestive) heart failure: Secondary | ICD-10-CM

## 2018-12-25 MED ORDER — POTASSIUM CHLORIDE CRYS ER 20 MEQ PO TBCR: 30.0000 meq | EXTENDED_RELEASE_TABLET | Freq: Every day | ORAL | 3 refills | Status: DC

## 2018-12-25 NOTE — Telephone Encounter (Signed)
LMTCB

## 2018-12-25 NOTE — Telephone Encounter (Signed)
Pt advised that is K is still low (3.1) and K supplements have been sent in for him to take but he needs RX sent to [facility].. will reorder and pt to be seen with repeat labs 12/29/18.

## 2018-12-25 NOTE — Telephone Encounter (Signed)
Patient returned call

## 2018-12-29 ENCOUNTER — Other Ambulatory Visit: Payer: Self-pay

## 2018-12-29 ENCOUNTER — Other Ambulatory Visit: Payer: Medicare Other

## 2018-12-29 ENCOUNTER — Ambulatory Visit (INDEPENDENT_AMBULATORY_CARE_PROVIDER_SITE_OTHER): Payer: Medicare Other | Admitting: Student

## 2018-12-29 DIAGNOSIS — I5022 Chronic systolic (congestive) heart failure: Secondary | ICD-10-CM

## 2018-12-29 DIAGNOSIS — Z79899 Other long term (current) drug therapy: Secondary | ICD-10-CM | POA: Diagnosis not present

## 2018-12-29 DIAGNOSIS — E876 Hypokalemia: Secondary | ICD-10-CM | POA: Diagnosis not present

## 2018-12-29 DIAGNOSIS — Z95810 Presence of automatic (implantable) cardiac defibrillator: Secondary | ICD-10-CM

## 2018-12-29 DIAGNOSIS — I428 Other cardiomyopathies: Secondary | ICD-10-CM | POA: Diagnosis not present

## 2018-12-29 DIAGNOSIS — I429 Cardiomyopathy, unspecified: Secondary | ICD-10-CM

## 2018-12-29 LAB — CUP PACEART INCLINIC DEVICE CHECK
Date Time Interrogation Session: 20201208131456
Implantable Lead Implant Date: 20120801
Implantable Lead Implant Date: 20120801
Implantable Lead Implant Date: 20120801
Implantable Lead Location: 753858
Implantable Lead Location: 753859
Implantable Lead Location: 753860
Lead Channel Pacing Threshold Amplitude: 0.625 V
Lead Channel Pacing Threshold Amplitude: 0.625 V
Lead Channel Pacing Threshold Amplitude: 0.875 V
Lead Channel Pacing Threshold Pulse Width: 0.5 ms
Lead Channel Pacing Threshold Pulse Width: 0.5 ms
Lead Channel Pacing Threshold Pulse Width: 0.5 ms
Lead Channel Setting Pacing Amplitude: 1.375
Lead Channel Setting Pacing Amplitude: 1.875
Lead Channel Setting Pacing Amplitude: 2 V
Lead Channel Setting Pacing Pulse Width: 0.5 ms
Lead Channel Setting Pacing Pulse Width: 0.5 ms
Pulse Gen Serial Number: 111012928

## 2018-12-29 LAB — BASIC METABOLIC PANEL
BUN/Creatinine Ratio: 13 (ref 10–24)
BUN: 18 mg/dL (ref 8–27)
CO2: 26 mmol/L (ref 20–29)
Calcium: 9.3 mg/dL (ref 8.6–10.2)
Chloride: 102 mmol/L (ref 96–106)
Creatinine, Ser: 1.38 mg/dL — ABNORMAL HIGH (ref 0.76–1.27)
GFR calc Af Amer: 56 mL/min/{1.73_m2} — ABNORMAL LOW (ref >59)
GFR calc non Af Amer: 49 mL/min/{1.73_m2} — ABNORMAL LOW (ref >59)
Glucose: 93 mg/dL (ref 65–99)
Potassium: 4 mmol/L (ref 3.5–5.2)
Sodium: 142 mmol/L (ref 134–144)

## 2018-12-29 NOTE — Progress Notes (Signed)
Wound check appointment. Steri-strips removed. Wound without redness or edema. Incision edges approximated, wound well healed. Normal device function. Thresholds, sensing, and impedances consistent with implant measurements. RA threshold stable for chronic lead. LV/RV threshold stable for chronic leads and auto-capture programmed on. Histogram distribution appropriate for patient and level of activity. 87 AMS episodes, available episodes with AF, known on coumadin. Total burden 13%. Patient educated about wound care, arm mobility, lifting restrictions, shock plan. ROV in 3 months with Dr. Lovena Le.  BiV paced 92%

## 2019-01-18 ENCOUNTER — Other Ambulatory Visit: Payer: Self-pay

## 2019-01-18 ENCOUNTER — Ambulatory Visit (INDEPENDENT_AMBULATORY_CARE_PROVIDER_SITE_OTHER): Payer: Medicare Other | Admitting: *Deleted

## 2019-01-18 DIAGNOSIS — Z5181 Encounter for therapeutic drug level monitoring: Secondary | ICD-10-CM | POA: Diagnosis not present

## 2019-01-18 DIAGNOSIS — I482 Chronic atrial fibrillation, unspecified: Secondary | ICD-10-CM

## 2019-01-18 LAB — POCT INR: INR: 2.4 (ref 2.0–3.0)

## 2019-01-18 NOTE — Patient Instructions (Signed)
Continue coumadin 2 tablets daily.  Recheck INR in 6 weeks 

## 2019-01-21 DIAGNOSIS — J441 Chronic obstructive pulmonary disease with (acute) exacerbation: Secondary | ICD-10-CM | POA: Diagnosis not present

## 2019-02-01 ENCOUNTER — Inpatient Hospital Stay (HOSPITAL_COMMUNITY): Admission: RE | Admit: 2019-02-01 | Payer: Medicare Other | Source: Ambulatory Visit

## 2019-02-03 ENCOUNTER — Telehealth: Payer: Self-pay | Admitting: Internal Medicine

## 2019-02-03 NOTE — Telephone Encounter (Signed)
We are recommending the COVID-19 vaccine to all of our patients. Cardiac medications (including blood thinners) should not deter anyone from being vaccinated and there is no need to hold any of those medications prior to vaccine administration.     Currently, there is a hotline to call (active 01/29/19) to schedule vaccination appointments as no walk-ins will be accepted.   Number: [phone]    If you have further questions or concerns about the vaccine process, please visit [email or web address] or contact your primary care physician.  Patient verbalized understanding.  

## 2019-02-10 ENCOUNTER — Other Ambulatory Visit: Payer: Self-pay | Admitting: Interventional Cardiology

## 2019-02-11 ENCOUNTER — Other Ambulatory Visit: Payer: Self-pay | Admitting: Interventional Cardiology

## 2019-02-15 ENCOUNTER — Other Ambulatory Visit: Payer: Self-pay | Admitting: Interventional Cardiology

## 2019-02-15 MED ORDER — CARVEDILOL 12.5 MG PO TABS: 12.5000 mg | ORAL_TABLET | Freq: Two times a day (BID) | ORAL | 5 refills | Status: DC

## 2019-02-15 NOTE — Telephone Encounter (Signed)
New Message      *STAT* If patient is at the pharmacy, call can be transferred to refill team.   1. Which medications need to be refilled? (please list name of each medication and dose if known) carvedilol (COREG) 12.5 MG tablet  2. Which pharmacy/location (including street and city if local pharmacy) is medication to be sent to? [facility] - [place], [place] - [place]  3. Do they need a 30 day or 90 day supply? [place]

## 2019-02-16 ENCOUNTER — Encounter (INDEPENDENT_AMBULATORY_CARE_PROVIDER_SITE_OTHER): Payer: Self-pay

## 2019-02-16 ENCOUNTER — Other Ambulatory Visit (HOSPITAL_COMMUNITY): Payer: Self-pay | Admitting: Interventional Cardiology

## 2019-02-16 ENCOUNTER — Other Ambulatory Visit: Payer: Self-pay

## 2019-02-16 ENCOUNTER — Ambulatory Visit (HOSPITAL_COMMUNITY)
Admission: RE | Admit: 2019-02-16 | Discharge: 2019-02-16 | Disposition: A | Discharge status: Home / Self Care | Payer: Medicare Other | Source: Ambulatory Visit | Attending: Cardiovascular Disease | Admitting: Cardiovascular Disease

## 2019-02-16 DIAGNOSIS — I6523 Occlusion and stenosis of bilateral carotid arteries: Secondary | ICD-10-CM

## 2019-02-16 DIAGNOSIS — I779 Disorder of arteries and arterioles, unspecified: Secondary | ICD-10-CM | POA: Diagnosis not present

## 2019-02-17 ENCOUNTER — Telehealth: Payer: Self-pay | Admitting: Interventional Cardiology

## 2019-02-17 DIAGNOSIS — I779 Disorder of arteries and arterioles, unspecified: Secondary | ICD-10-CM

## 2019-02-17 NOTE — Telephone Encounter (Signed)
Patient is returning call in regards to results from Carotid completed on 02/16/19. Please call.

## 2019-02-17 NOTE — Telephone Encounter (Signed)
I spoke with patient and reviewed carotid doppler results with him 

## 2019-02-17 NOTE — Telephone Encounter (Signed)
Left message to call back  

## 2019-02-21 DIAGNOSIS — J441 Chronic obstructive pulmonary disease with (acute) exacerbation: Secondary | ICD-10-CM | POA: Diagnosis not present

## 2019-03-01 ENCOUNTER — Ambulatory Visit (INDEPENDENT_AMBULATORY_CARE_PROVIDER_SITE_OTHER): Payer: Medicare Other | Admitting: *Deleted

## 2019-03-01 ENCOUNTER — Other Ambulatory Visit: Payer: Self-pay

## 2019-03-01 DIAGNOSIS — Z5181 Encounter for therapeutic drug level monitoring: Secondary | ICD-10-CM

## 2019-03-01 DIAGNOSIS — I482 Chronic atrial fibrillation, unspecified: Secondary | ICD-10-CM | POA: Diagnosis not present

## 2019-03-01 DIAGNOSIS — I480 Paroxysmal atrial fibrillation: Secondary | ICD-10-CM

## 2019-03-01 LAB — POCT INR: INR: 3.9 — AB (ref 2.0–3.0)

## 2019-03-01 NOTE — Patient Instructions (Signed)
Hold warfarin tonight, take 1 tablet tomorrow night then resume 2 tablets daily.  Recheck INR in 3 weeks

## 2019-03-08 ENCOUNTER — Other Ambulatory Visit: Payer: Self-pay

## 2019-03-08 MED ORDER — CARVEDILOL 12.5 MG PO TABS: 12.5000 mg | ORAL_TABLET | Freq: Two times a day (BID) | ORAL | 2 refills | Status: DC

## 2019-03-09 ENCOUNTER — Other Ambulatory Visit: Payer: Self-pay | Admitting: Emergency Medicine

## 2019-03-09 ENCOUNTER — Other Ambulatory Visit: Payer: Self-pay | Admitting: Internal Medicine

## 2019-03-09 DIAGNOSIS — Z8679 Personal history of other diseases of the circulatory system: Secondary | ICD-10-CM

## 2019-03-09 DIAGNOSIS — E782 Mixed hyperlipidemia: Secondary | ICD-10-CM

## 2019-03-10 ENCOUNTER — Telehealth: Payer: Self-pay | Admitting: Emergency Medicine

## 2019-03-10 MED ORDER — ALBUTEROL SULFATE 1.25 MG/3ML IN NEBU: INHALATION_SOLUTION | RESPIRATORY_TRACT | 2 refills | Status: DC

## 2019-03-10 NOTE — Telephone Encounter (Signed)
Rx has been sent in. Nothing further was needed. 

## 2019-03-16 ENCOUNTER — Ambulatory Visit (INDEPENDENT_AMBULATORY_CARE_PROVIDER_SITE_OTHER): Payer: Medicare Other | Admitting: *Deleted

## 2019-03-16 DIAGNOSIS — I5022 Chronic systolic (congestive) heart failure: Secondary | ICD-10-CM | POA: Diagnosis not present

## 2019-03-16 LAB — CUP PACEART REMOTE DEVICE CHECK
Battery Remaining Longevity: 87 mo
Battery Remaining Percentage: 95 %
Battery Voltage: 2.99 V
Brady Statistic AP VP Percent: 79 %
Brady Statistic AP VS Percent: 1 %
Brady Statistic AS VP Percent: 19 %
Brady Statistic AS VS Percent: 1 %
Brady Statistic RA Percent Paced: 72 %
Date Time Interrogation Session: 20210223020138
HighPow Impedance: 47 Ohm
Implantable Lead Implant Date: 20120801
Implantable Lead Implant Date: 20120801
Implantable Lead Implant Date: 20120801
Implantable Lead Location: 753858
Implantable Lead Location: 753859
Implantable Lead Location: 753860
Implantable Pulse Generator Implant Date: 20201123
Lead Channel Impedance Value: 390 Ohm
Lead Channel Impedance Value: 550 Ohm
Lead Channel Impedance Value: 850 Ohm
Lead Channel Pacing Threshold Amplitude: 0.625 V
Lead Channel Pacing Threshold Amplitude: 0.625 V
Lead Channel Pacing Threshold Amplitude: 1 V
Lead Channel Pacing Threshold Pulse Width: 0.5 ms
Lead Channel Pacing Threshold Pulse Width: 0.5 ms
Lead Channel Pacing Threshold Pulse Width: 0.5 ms
Lead Channel Sensing Intrinsic Amplitude: 12 mV
Lead Channel Sensing Intrinsic Amplitude: 5 mV
Lead Channel Setting Pacing Amplitude: 1.5 V
Lead Channel Setting Pacing Amplitude: 1.625
Lead Channel Setting Pacing Amplitude: 2 V
Lead Channel Setting Pacing Pulse Width: 0.5 ms
Lead Channel Setting Pacing Pulse Width: 0.5 ms
Lead Channel Setting Sensing Sensitivity: 0.5 mV
Pulse Gen Serial Number: 111012928

## 2019-03-17 NOTE — Progress Notes (Signed)
ICD Remote  

## 2019-03-19 ENCOUNTER — Encounter: Payer: Self-pay | Admitting: Internal Medicine

## 2019-03-19 ENCOUNTER — Other Ambulatory Visit: Payer: Self-pay

## 2019-03-19 ENCOUNTER — Ambulatory Visit (INDEPENDENT_AMBULATORY_CARE_PROVIDER_SITE_OTHER): Payer: Medicare Other | Admitting: Internal Medicine

## 2019-03-19 VITALS — BP 132/66 | HR 60 | Ht 66.0 in | Wt 183.0 lb

## 2019-03-19 DIAGNOSIS — I5022 Chronic systolic (congestive) heart failure: Secondary | ICD-10-CM

## 2019-03-19 DIAGNOSIS — I480 Paroxysmal atrial fibrillation: Secondary | ICD-10-CM | POA: Diagnosis not present

## 2019-03-19 DIAGNOSIS — Z95810 Presence of automatic (implantable) cardiac defibrillator: Secondary | ICD-10-CM

## 2019-03-19 NOTE — Progress Notes (Signed)
HPI Mr. Hyder returns today for followup. He is a pleasant 79 yo man with a h/o CHB, s/p biv ICD insertion who also has a non-ischemic CM. In the interim, he has undergo biv ICD gen change out. He denies chest pain or sob. No dyspnea.  Allergies  Allergen Reactions  . Benadryl [Diphenhydramine Hcl] Nausea And Vomiting     Current Outpatient Medications  Medication Sig Dispense Refill  . acetaminophen (TYLENOL) 500 MG tablet Take 1,000 mg by mouth every 6 (six) hours as needed for moderate pain.     Marland Kitchen albuterol (ACCUNEB) 1.25 MG/3ML nebulizer solution inhale THE contents of ONE vial EVERY 6 HOURS AS NEEDED 360 mL 2  . albuterol (PROAIR HFA) 108 (90 Base) MCG/ACT inhaler INHALE 1-2 PUFFS INTO THE LUNGS EVERY 6 HOURS AS NEEDED FOR WHEEZING OR SHORTNESS OF BREATH (Patient taking differently: Inhale 1-2 puffs into the lungs every 6 (six) hours as needed for wheezing or shortness of breath. ) 8.5 g 2  . allopurinol (ZYLOPRIM) 100 MG tablet Take 1 tablet (100 mg total) daily by mouth. 90 tablet 0  . atorvastatin (LIPITOR) 10 MG tablet TAKE ONE TABLET BY MOUTH DAILY 30 tablet 3  . carvedilol (COREG) 12.5 MG tablet Take 1 tablet (12.5 mg total) by mouth 2 (two) times daily with a meal. 180 tablet 2  . cetirizine (ZYRTEC) 10 MG tablet Take 10 mg by mouth daily.    . digoxin (LANOXIN) 0.125 MG tablet Take 0.5 tablets (0.0625 mg total) daily by mouth. 45 tablet 0  . fluticasone (FLONASE) 50 MCG/ACT nasal spray Place 2 sprays into both nostrils at bedtime as needed for allergies. 16 g 3  . furosemide (LASIX) 40 MG tablet TAKE 1 AND 1/2 TABLETS BY MOUTH TWICE DAILY (Patient taking differently: Take 60 mg by mouth 2 (two) times daily. ) 90 tablet 7  . potassium chloride SA (KLOR-CON) 20 MEQ tablet Take 1.5 tablets (30 mEq total) by mouth daily. 135 tablet 3  . TRELEGY ELLIPTA 100-62.5-25 MCG/INH AEPB INHALE ONE PUFF INTO LUNGS EVERY DAY 60 each 2  . warfarin (COUMADIN) 1 MG tablet TAKE TWO (2)  TABLETS BY MOUTH EVERY DAY OR AS DIRECTED 65 tablet 6  . alfuzosin (UROXATRAL) 10 MG 24 hr tablet Take 10 mg by mouth daily.     No current facility-administered medications for this visit.     Past Medical History:  Diagnosis Date  . Atrial fibrillation ([place])   . CAD (coronary artery disease) 1996   status post PCI of the RCA   . Community acquired pneumonia 08/24/2015  . Congestive heart failure, unspecified   . COPD (chronic obstructive pulmonary disease) (HCC)    Pt on home O2 at night and PRN, unsure of date of diagnosis.  . Diabetes mellitus, type 2 ([place])   . DJD (degenerative joint disease)   . GERD (gastroesophageal reflux disease)   . Gout   . HTN (hypertension)   . 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 Nephrolithiasis   . Other primary cardiomyopathies   . Personal history of DVT (deep vein thrombosis)   . Prostatitis   . Shingles     ROS:   All systems reviewed and negative except as noted in the HPI.   Past Surgical History:  Procedure Laterality Date  . BACK SURGERY    . BIV ICD GENERATOR CHANGEOUT N/A 12/14/2018   Procedure: BIV ICD GENERATOR CHANGEOUT;  Surgeon: Evans Lance, MD;  Location: [facility]  INVASIVE CV LAB;  Service: Cardiovascular;  Laterality: N/A;  . CARDIAC DEFIBRILLATOR PLACEMENT    . CORONARY STENT PLACEMENT    . PACEMAKER INSERTION       Family History  Problem Relation Age of Onset  . Heart disease Father   . Cancer Father        LUNG  . Colon cancer Mother   . Stroke Paternal Uncle   . Heart attack Neg Hx   . Hyperlipidemia Neg Hx   . Hypertension Neg Hx      Social History   Socioeconomic History  . Marital status: Single    Spouse name: Not on file  . Number of children: 0  . Years of education: Not on file  . Highest education level: Not on file  Occupational History  . Occupation: Retired from Chief of Staff  . Smoking status: Former Smoker    Packs/day: 4.00    Years: 50.00    Pack years:  200.00    Types: Cigarettes    Quit date: 01/22/1980    Years since quitting: 39.1  . Smokeless tobacco: Never Used  Substance and Sexual Activity  . Alcohol use: No    Alcohol/week: 0.0 standard drinks    Comment: denies  . Drug use: No  . Sexual activity: Never  Other Topics Concern  . Not on file  Social History Narrative   Lives alone.  Single.  No children.  Ambulates with a cane.  Has a deceased brother and is estranged from sisters.   Social Determinants of Health   Financial Resource Strain:   . Difficulty of Paying Living Expenses: Not on file  Food Insecurity:   . Worried About Charity fundraiser in the Last Year: Not on file  . Ran Out of Food in the Last Year: Not on file  Transportation Needs: No Transportation Needs  . Lack of Transportation (Medical): No  . Lack of Transportation (Non-Medical): No  Physical Activity:   . Days of Exercise per Week: Not on file  . Minutes of Exercise per Session: Not on file  Stress:   . Feeling of Stress : Not on file  Social Connections:   . Frequency of Communication with Friends and Family: Not on file  . Frequency of Social Gatherings with Friends and Family: Not on file  . Attends Religious Services: Not on file  . Active Member of Clubs or Organizations: Not on file  . Attends Archivist Meetings: Not on file  . Marital Status: Not on file  Intimate Partner Violence:   . Fear of Current or Ex-Partner: Not on file  . Emotionally Abused: Not on file  . Physically Abused: Not on file  . Sexually Abused: Not on file     BP 132/66   Pulse 60   Ht 5\' 6"  (1.676 m)   Wt 183 lb (83 kg)   SpO2 96%   BMI 29.54 kg/m   Physical Exam:  Well appearing NAD HEENT: Unremarkable Neck:  No JVD, no thyromegally Lymphatics:  No adenopathy Back:  No CVA tenderness Lungs:  Clear with no wheezes HEART:  Regular rate rhythm, no murmurs, no rubs, no clicks Abd:  soft, positive bowel sounds, no organomegally, no  rebound, no guarding Ext:  2 plus pulses, no edema, no cyanosis, no clubbing Skin:  No rashes no nodules Neuro:  CN II through XII intact, motor grossly intact  DEVICE  Normal device function.  See PaceArt for  details.   Assess/Plan: 1. Chronic systolic heart failure - His symptoms remain class 2. He will continue his current meds. 2. PAF - he has maintained NSR very nicely. No change in meds. 3. ICD - his [employer] Biv ICD is working normally. We will recheck in several months.  Cristopher Peru, M.D.

## 2019-03-19 NOTE — Patient Instructions (Signed)
Medication Instructions:  Your physician recommends that you continue on your current medications as directed. Please refer to the Current Medication list given to you today.  Labwork: None ordered.  Testing/Procedures: None ordered.  Follow-Up: Your physician wants you to follow-up in: one year with Dr. Lovena Le.   You will receive a reminder letter in the mail two months in advance. If you don't receive a letter, please call our office to schedule the follow-up appointment.  Remote monitoring is used to monitor your ICD from home. This monitoring reduces the number of office visits required to check your device to one time per year. It allows [place] to keep an eye on the functioning of your device to ensure it is working properly. You are scheduled for a device check from home on 06/15/2019. You may send your transmission at any time that day. If you have a wireless device, the transmission will be sent automatically. After your physician reviews your transmission, you will receive a postcard with your next transmission date.  Any Other Special Instructions Will Be Listed Below (If Applicable).  If you need a refill on your cardiac medications before your next appointment, please call your pharmacy.

## 2019-03-21 DIAGNOSIS — J441 Chronic obstructive pulmonary disease with (acute) exacerbation: Secondary | ICD-10-CM | POA: Diagnosis not present

## 2019-03-22 ENCOUNTER — Other Ambulatory Visit: Payer: Self-pay

## 2019-03-22 ENCOUNTER — Ambulatory Visit (INDEPENDENT_AMBULATORY_CARE_PROVIDER_SITE_OTHER): Payer: Medicare Other | Admitting: *Deleted

## 2019-03-22 DIAGNOSIS — I480 Paroxysmal atrial fibrillation: Secondary | ICD-10-CM | POA: Diagnosis not present

## 2019-03-22 DIAGNOSIS — Z5181 Encounter for therapeutic drug level monitoring: Secondary | ICD-10-CM

## 2019-03-22 DIAGNOSIS — I482 Chronic atrial fibrillation, unspecified: Secondary | ICD-10-CM | POA: Diagnosis not present

## 2019-03-22 LAB — POCT INR: INR: 2.2 (ref 2.0–3.0)

## 2019-03-22 NOTE — Patient Instructions (Signed)
Continue warfarin 2 tablets daily.  Recheck INR in 4 weeks

## 2019-03-26 ENCOUNTER — Other Ambulatory Visit: Payer: Self-pay | Admitting: Emergency Medicine

## 2019-04-06 ENCOUNTER — Ambulatory Visit (INDEPENDENT_AMBULATORY_CARE_PROVIDER_SITE_OTHER): Payer: Medicare Other | Admitting: Nurse Practitioner

## 2019-04-07 DIAGNOSIS — L853 Xerosis cutis: Secondary | ICD-10-CM | POA: Diagnosis not present

## 2019-04-07 DIAGNOSIS — L905 Scar conditions and fibrosis of skin: Secondary | ICD-10-CM | POA: Diagnosis not present

## 2019-04-07 DIAGNOSIS — L259 Unspecified contact dermatitis, unspecified cause: Secondary | ICD-10-CM | POA: Diagnosis not present

## 2019-04-07 DIAGNOSIS — L821 Other seborrheic keratosis: Secondary | ICD-10-CM | POA: Diagnosis not present

## 2019-04-12 ENCOUNTER — Other Ambulatory Visit: Payer: Self-pay

## 2019-04-12 ENCOUNTER — Encounter: Payer: Self-pay | Admitting: Emergency Medicine

## 2019-04-12 ENCOUNTER — Ambulatory Visit (INDEPENDENT_AMBULATORY_CARE_PROVIDER_SITE_OTHER): Payer: Medicare Other | Admitting: Emergency Medicine

## 2019-04-12 DIAGNOSIS — J449 Chronic obstructive pulmonary disease, unspecified: Secondary | ICD-10-CM

## 2019-04-12 DIAGNOSIS — J301 Allergic rhinitis due to pollen: Secondary | ICD-10-CM

## 2019-04-12 DIAGNOSIS — J9611 Chronic respiratory failure with hypoxia: Secondary | ICD-10-CM

## 2019-04-12 NOTE — Progress Notes (Signed)
Subjective:    Patient ID: Troy [name], male    DOB: 12-18-1940, 79 y.o.   MRN: [id number]  HPI 79 yo former smoker (100 pk-yrs), hx CAD and dilated CM, A Fib, DM, DVT's, AICD device. Dx w COPD several years ago. Referred by Dr Deforest Hoyles for dyspnea, cough and congestion.    ROV 12/22/17 --this is a follow-up visit for Troy [name] who has history as outlined above.  We follow him for severe COPD, allergic rhinitis with associated cough.  He has documented exertional hypoxemia, does not reliably use his oxygen with exertion.  Since last time he reports that he has been doing well. He is able to get around his house, do his ADL, his shopping. Denies any cough or wheeze with any frequency.  He is on trelegy, uses albuterol nebs about 2x a day. He is on zyrtec, flonase. Flu shot is up to date. PNA vaccine up to date. No flares since last time. He was hospitalized at [facility] for diverticulitis  ROV 04/12/19 --Troy [name] with a history of heavy tobacco use and severe COPD, allergic rhinitis, chronic cough.  He also has coronary artery disease and a dilated cardiomyopathy with AICD in place, atrial fibrillation, diabetes.  He has exertional hypoxemia - uses his O2 prn and at night. Uses it when SOB.  I last spoke with him 10/15/2018.  He reports today no flares. He does have exertional SOB, probably stable compared with last year. He has some occasional cough, minimal mucous, happens when he drinks something cold. He sometimes has dysphagia of liquids, not solids. He has a lot nasal gtt.  He is managed on Trelegy, uses albuterol most days, about once a day.  He is on Flonase, Zyrtec He has had first covid vaccine, due for his second next week   Review of Systems  Constitutional: Negative for fever and unexpected weight change.  HENT: Negative for congestion, dental problem, ear pain, nosebleeds, postnasal drip, rhinorrhea, sinus pressure, sneezing, sore throat and trouble swallowing.    Eyes: Negative for redness and itching.  Respiratory: Positive for cough and shortness of breath. Negative for chest tightness and wheezing.        Congestion  Cardiovascular: Negative for palpitations and leg swelling.  Gastrointestinal: Negative for nausea and vomiting.  Genitourinary: Negative for dysuria.  Musculoskeletal: Negative for joint swelling.  Skin: Negative for rash.  Neurological: Negative for headaches.  Hematological: Does not bruise/bleed easily.  Psychiatric/Behavioral: Negative for dysphoric mood. The patient is not nervous/anxious.       Objective:   Physical Exam Vitals:   04/12/19 1140  BP: (!) 116/58  Pulse: 68  Temp: (!) 97.5 F (36.4 C)  TempSrc: Temporal  SpO2: 97%  Weight: 178 lb (80.7 kg)  Height: 5\' 6"  (1.676 m)  Gen: Pleasant, well-nourished, in no distress,  normal affect  ENT: No lesions,  mouth clear,  oropharynx clear, disconjugate gaze, no postnasal drip  Neck: No JVD, no stridor  Lungs: normal excursion, somewhat hyperinflated, no wheeze or crackles. He does have exp wheeze on a forced exp  Cardiovascular: RRR, heart sounds normal, no murmur or gallops, no peripheral edema  Musculoskeletal: No deformities, no cyanosis or clubbing  Neuro: alert, non focal  Skin: Warm, no lesions or rashes     Assessment & Plan:  COPD (chronic obstructive pulmonary disease) (HCC) Severe disease.  No flares.  May have lost some functional capacity with deconditioning and isolation.  No change in mucus burden.  Overall I think he is stable.  Continue Trelegy, albuterol as needed.  Chronic respiratory failure with hypoxia (HCC) He uses oxygen as needed.  Probably truncates his use due to the weight of his portable tanks.  I will try to qualify him today for POC and order if he does qualify.  He would like to change from [employer] to an alternative DME.  Allergic rhinitis Continue Flonase, Zyrtec as ordered.  Good compliance  Baltazar Apo, MD,  PhD 04/12/2019, 12:02 PM [facility] [phone] or if no answer [phone]

## 2019-04-12 NOTE — Assessment & Plan Note (Signed)
He uses oxygen as needed.  Probably truncates his use due to the weight of his portable tanks.  I will try to qualify him today for POC and order if he does qualify.  He would like to change from [employer] to an alternative DME.

## 2019-04-12 NOTE — Patient Instructions (Addendum)
Please continue Trelegy once daily as you have been taking it.  Rinse and gargle after using. Keep your albuterol available to use 1 nebulizer treatment up to every 4 hours if needed for shortness of breath, chest tightness, wheezing. Please continue your Flonase and Zyrtec as you have been taking them. Walking oximetry today.  If you qualify we will try to order a portable oxygen concentrator so you can wear your oxygen more reliably with exertion. Get your second COVID-19 vaccine as planned. Follow with Dr Lamonte Sakai in 6 months or sooner if you have any problems

## 2019-04-12 NOTE — Assessment & Plan Note (Signed)
Continue Flonase, Zyrtec as ordered.  Good compliance

## 2019-04-12 NOTE — Assessment & Plan Note (Signed)
Severe disease.  No flares.  May have lost some functional capacity with deconditioning and isolation.  No change in mucus burden.  Overall I think he is stable.  Continue Trelegy, albuterol as needed.

## 2019-04-13 ENCOUNTER — Ambulatory Visit: Payer: Medicare Other | Admitting: Emergency Medicine

## 2019-04-19 ENCOUNTER — Ambulatory Visit (INDEPENDENT_AMBULATORY_CARE_PROVIDER_SITE_OTHER): Payer: Medicare Other | Admitting: *Deleted

## 2019-04-19 ENCOUNTER — Other Ambulatory Visit: Payer: Self-pay

## 2019-04-19 DIAGNOSIS — Z5181 Encounter for therapeutic drug level monitoring: Secondary | ICD-10-CM

## 2019-04-19 DIAGNOSIS — I482 Chronic atrial fibrillation, unspecified: Secondary | ICD-10-CM

## 2019-04-19 LAB — POCT INR: INR: 2.2 (ref 2.0–3.0)

## 2019-04-19 NOTE — Patient Instructions (Signed)
Continue warfarin 2 tablets daily.  Recheck INR in 5 weeks

## 2019-04-22 DIAGNOSIS — J441 Chronic obstructive pulmonary disease with (acute) exacerbation: Secondary | ICD-10-CM | POA: Diagnosis not present

## 2019-04-27 DIAGNOSIS — L821 Other seborrheic keratosis: Secondary | ICD-10-CM | POA: Diagnosis not present

## 2019-04-27 DIAGNOSIS — D692 Other nonthrombocytopenic purpura: Secondary | ICD-10-CM | POA: Diagnosis not present

## 2019-04-27 DIAGNOSIS — L853 Xerosis cutis: Secondary | ICD-10-CM | POA: Diagnosis not present

## 2019-04-27 DIAGNOSIS — L820 Inflamed seborrheic keratosis: Secondary | ICD-10-CM | POA: Diagnosis not present

## 2019-04-27 DIAGNOSIS — L259 Unspecified contact dermatitis, unspecified cause: Secondary | ICD-10-CM | POA: Diagnosis not present

## 2019-04-30 ENCOUNTER — Other Ambulatory Visit: Payer: Self-pay | Admitting: Emergency Medicine

## 2019-05-19 ENCOUNTER — Other Ambulatory Visit: Payer: Self-pay | Admitting: Interventional Cardiology

## 2019-05-19 MED ORDER — DIGOXIN 125 MCG PO TABS: 0.0625 mg | ORAL_TABLET | Freq: Every day | ORAL | 0 refills | Status: DC

## 2019-05-19 NOTE — Telephone Encounter (Signed)
New Message    *STAT* If patient is at the pharmacy, call can be transferred to refill team.   1. Which medications need to be refilled? (please list name of each medication and dose if known)  digoxin (LANOXIN) 0.125 MG tablet  2. Which pharmacy/location (including street and city if local pharmacy) is medication to be sent to? [facility] - [place], [facility] [place]  3. Do they need a 30 day or 90 day supply? 90 day   Pt is out of medication

## 2019-05-19 NOTE — Telephone Encounter (Signed)
This is a [place] pt °

## 2019-05-19 NOTE — Telephone Encounter (Signed)
Pt's medication was sent to pt's pharmacy as requested. Confirmation received.  °

## 2019-05-24 ENCOUNTER — Other Ambulatory Visit: Payer: Self-pay

## 2019-05-24 ENCOUNTER — Ambulatory Visit (INDEPENDENT_AMBULATORY_CARE_PROVIDER_SITE_OTHER): Payer: Medicare Other | Admitting: *Deleted

## 2019-05-24 DIAGNOSIS — Z5181 Encounter for therapeutic drug level monitoring: Secondary | ICD-10-CM | POA: Diagnosis not present

## 2019-05-24 DIAGNOSIS — I482 Chronic atrial fibrillation, unspecified: Secondary | ICD-10-CM | POA: Diagnosis not present

## 2019-05-24 LAB — POCT INR: INR: 3.8 — AB (ref 2.0–3.0)

## 2019-05-24 NOTE — Patient Instructions (Signed)
Hold warfarin tonight then resume 2 tablets daily. Eat extra greens/salad today  Recheck INR in 4 weeks

## 2019-05-31 DIAGNOSIS — H2513 Age-related nuclear cataract, bilateral: Secondary | ICD-10-CM | POA: Diagnosis not present

## 2019-06-09 ENCOUNTER — Other Ambulatory Visit: Payer: Self-pay | Admitting: Emergency Medicine

## 2019-06-15 ENCOUNTER — Ambulatory Visit (INDEPENDENT_AMBULATORY_CARE_PROVIDER_SITE_OTHER): Payer: Medicare Other | Admitting: *Deleted

## 2019-06-15 ENCOUNTER — Other Ambulatory Visit: Payer: Self-pay | Admitting: Interventional Cardiology

## 2019-06-15 DIAGNOSIS — I428 Other cardiomyopathies: Secondary | ICD-10-CM

## 2019-06-15 DIAGNOSIS — I429 Cardiomyopathy, unspecified: Secondary | ICD-10-CM

## 2019-06-15 LAB — CUP PACEART REMOTE DEVICE CHECK
Battery Remaining Longevity: 82 mo
Battery Remaining Percentage: 92 %
Battery Voltage: 2.98 V
Brady Statistic AP VP Percent: 81 %
Brady Statistic AP VS Percent: 1 %
Brady Statistic AS VP Percent: 15 %
Brady Statistic AS VS Percent: 1 %
Brady Statistic RA Percent Paced: 73 %
Date Time Interrogation Session: 20210525030344
HighPow Impedance: 42 Ohm
Implantable Lead Implant Date: 20120801
Implantable Lead Implant Date: 20120801
Implantable Lead Implant Date: 20120801
Implantable Lead Location: 753858
Implantable Lead Location: 753859
Implantable Lead Location: 753860
Implantable Pulse Generator Implant Date: 20201123
Lead Channel Impedance Value: 360 Ohm
Lead Channel Impedance Value: 550 Ohm
Lead Channel Impedance Value: 860 Ohm
Lead Channel Pacing Threshold Amplitude: 0.625 V
Lead Channel Pacing Threshold Amplitude: 0.625 V
Lead Channel Pacing Threshold Amplitude: 1.25 V
Lead Channel Pacing Threshold Pulse Width: 0.5 ms
Lead Channel Pacing Threshold Pulse Width: 0.5 ms
Lead Channel Pacing Threshold Pulse Width: 0.5 ms
Lead Channel Sensing Intrinsic Amplitude: 12 mV
Lead Channel Sensing Intrinsic Amplitude: 5 mV
Lead Channel Setting Pacing Amplitude: 1.625
Lead Channel Setting Pacing Amplitude: 1.75 V
Lead Channel Setting Pacing Amplitude: 2 V
Lead Channel Setting Pacing Pulse Width: 0.5 ms
Lead Channel Setting Pacing Pulse Width: 0.5 ms
Lead Channel Setting Sensing Sensitivity: 0.5 mV
Pulse Gen Serial Number: 111012928

## 2019-06-16 NOTE — Progress Notes (Signed)
Remote ICD transmission.   

## 2019-06-21 DIAGNOSIS — J441 Chronic obstructive pulmonary disease with (acute) exacerbation: Secondary | ICD-10-CM | POA: Diagnosis not present

## 2019-06-23 ENCOUNTER — Ambulatory Visit (INDEPENDENT_AMBULATORY_CARE_PROVIDER_SITE_OTHER): Payer: Medicare Other | Admitting: *Deleted

## 2019-06-23 ENCOUNTER — Other Ambulatory Visit: Payer: Self-pay

## 2019-06-23 DIAGNOSIS — Z5181 Encounter for therapeutic drug level monitoring: Secondary | ICD-10-CM | POA: Diagnosis not present

## 2019-06-23 DIAGNOSIS — I482 Chronic atrial fibrillation, unspecified: Secondary | ICD-10-CM | POA: Diagnosis not present

## 2019-06-23 LAB — POCT INR: INR: 3.2 — AB (ref 2.0–3.0)

## 2019-06-23 NOTE — Patient Instructions (Signed)
Decrease warfarin to 2 tablets daily except 1 tablet on Wednesdays Eat extra greens/salad today  Recheck INR in 4 weeks

## 2019-06-24 NOTE — Progress Notes (Signed)
Cardiology Office Note   Date:  06/25/2019   ID:  Troy [name], DOB 11-15-1940, MRN [id number]  PCP:  Neale Burly, MD    No chief complaint on file.  Atrial fibrillation/coronary artery disease  Wt Readings from Last 3 Encounters:  06/25/19 178 lb 12.8 oz (81.1 kg)  04/12/19 178 lb (80.7 kg)  03/19/19 183 lb (83 kg)       History of Present Illness: Troy [name] is a 78 y.o. male  with chronic systolic heart failure, BiV ICD.  He has a mixed CM, chronic class 2 CHF, s/p biv-ICD-PLACED 2012, chb, and PAF. CAD, PCI of RCA in 1996 .   Echo 11/22/13 with EF 20-25%.   Hospitalized in12/16 for acute on chronic systolic HF. Was neg 5 L with diuresis- discharged with oxygen and to SNF for rehab.   Heart failure managed medically. Digoxin level of 1.8 was associated with nausea.   Hospitalized in 8/17 for presumed Lower GI bleed form diverticulosis. No colonoscopy was done.  ECHO in 2018 for branch retinal occlusion showed EF 35-40%.He is using [place] oxygen at night.  He was admitted in Jan 2019 with acute on chronic hypoxic resp failure. Hospital records show: "2D echo with a EF of 25-30%, global hypokinesis with akinesis of the inferior lateral wall. Overall severely reduced LV function. Grade 1 diastolic dysfunction. Due to decreased ejection fraction and wall motion abnormalities cardiology was consulted. Patientunderwent Lexiscan Myoview on 02/03/2017 which showed no inducible ischemia, evidence of scar in the inferior distribution. Cardiology recommended patient be transitioned to home regimen of oral diuretics with salt and fluid restriction."   The patient does not have symptoms concerning for COVID-19 infection (fever, chills, cough, or new shortness of breath).   Since the last visit, he has done well.  He got his COVID vaccines.   Staying away from salt in his diet.  He is trying to walk some but is careful to avoid falling.  He has  a scale but does not weigh every day.  He weighs on occasion and his weight has been stable.  Denies : Chest pain. Dizziness. Leg edema. Nitroglycerin use. Orthopnea. Palpitations. Paroxysmal nocturnal dyspnea. Shortness of breath. Syncope.       Past Medical History:  Diagnosis Date  . Atrial fibrillation ([place])   . CAD (coronary artery disease) 1996   status post PCI of the RCA   . Community acquired pneumonia 08/24/2015  . Congestive heart failure, unspecified   . COPD (chronic obstructive pulmonary disease) (HCC)    Pt on home O2 at night and PRN, unsure of date of diagnosis.  . Diabetes mellitus, type 2 ([place])   . DJD (degenerative joint disease)   . GERD (gastroesophageal reflux disease)   . Gout   . HTN (hypertension)   . 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 Nephrolithiasis   . Other primary cardiomyopathies   . Personal history of DVT (deep vein thrombosis)   . Prostatitis   . Shingles     Past Surgical History:  Procedure Laterality Date  . BACK SURGERY    . BIV ICD GENERATOR CHANGEOUT N/A 12/14/2018   Procedure: BIV ICD GENERATOR CHANGEOUT;  Surgeon: Evans Lance, MD;  Location: [place] CV LAB;  Service: Cardiovascular;  Laterality: N/A;  . CARDIAC DEFIBRILLATOR PLACEMENT    . CORONARY STENT PLACEMENT    . PACEMAKER INSERTION       Current Outpatient Medications  Medication Sig Dispense Refill  .  acetaminophen (TYLENOL) 500 MG tablet Take 1,000 mg by mouth every 6 (six) hours as needed for moderate pain.     [name] Kitchen albuterol (ACCUNEB) 1.25 MG/3ML nebulizer solution INHALE CONTENTS OF 1 VIAL IN NEBULIZER EVERY 6 HOURS AS NEEDED. 360 mL 2  . albuterol (VENTOLIN HFA) 108 (90 Base) MCG/ACT inhaler INHALE 1-2 PUFFS INTO THE LUNGS EVERY 6 HOURS AS NEEDED FOR WHEEZING OR SHORTNESS OF BREATH 8.5 g 5  . alfuzosin (UROXATRAL) 10 MG 24 hr tablet Take 10 mg by mouth daily.    [name] Kitchen allopurinol (ZYLOPRIM) 100 MG tablet Take 1 tablet (100 mg total) daily by mouth. 90 tablet  0  . atorvastatin (LIPITOR) 10 MG tablet TAKE ONE TABLET BY MOUTH DAILY 30 tablet 3  . carvedilol (COREG) 12.5 MG tablet Take 1 tablet (12.5 mg total) by mouth 2 (two) times daily with a meal. 180 tablet 2  . cetirizine (ZYRTEC) 10 MG tablet Take 10 mg by mouth daily.    . digoxin (LANOXIN) 0.125 MG tablet Take 0.5 tablets (0.0625 mg total) by mouth daily. Please make yearly appt with [name] for June before anymore refills. 1st attempt 45 tablet 0  . fluticasone (FLONASE) 50 MCG/ACT nasal spray Place 2 sprays into both nostrils at bedtime as needed for allergies. 16 g 3  . furosemide (LASIX) 40 MG tablet TAKE 1 AND 1/2 TABLETS TABLET BY MOUTH TWICE DAILY 90 tablet 6  . potassium chloride SA (KLOR-CON) 20 MEQ tablet Take 1.5 tablets (30 mEq total) by mouth daily. 135 tablet 3  . TRELEGY ELLIPTA 100-62.5-25 MCG/INH AEPB INHALE ONE PUFF INTO LUNGS EVERY DAY 60 each 2  . warfarin (COUMADIN) 1 MG tablet TAKE TWO (2) TABLETS BY MOUTH EVERY DAY OR AS DIRECTED 65 tablet 6   No current facility-administered medications for this visit.    Allergies:   Benadryl [diphenhydramine hcl]    Social History:  The patient  reports that he quit smoking about 39 years ago. His smoking use included cigarettes. He has a 200.00 pack-year smoking history. He has never used smokeless tobacco. He reports that he does not drink alcohol or use drugs.   Family History:  The patient's family history includes Cancer in his father; Colon cancer in his mother; Heart disease in his father; Stroke in his paternal uncle.    ROS:  Please see the history of present illness.   Otherwise, review of systems are positive for poor balance, uses a cane.   All other systems are reviewed and negative.    PHYSICAL EXAM: VS:  BP 130/64   Pulse 66   Ht 5\' 6"  (1.676 m)   Wt 178 lb 12.8 oz (81.1 kg)   SpO2 91%   BMI 28.86 kg/m  , BMI Body mass index is 28.86 kg/m. GEN: Well nourished, well developed, in no acute distress    HEENT: normal  Neck: no JVD, carotid bruits, or masses Cardiac: RRR; no murmurs, rubs, or gallops,no edema  Respiratory:  clear to auscultation bilaterally, normal work of breathing GI: soft, nontender, nondistended, + BS MS: no deformity or atrophy  Skin: warm and dry, no rash Neuro:  Strength and sensation are intact Psych: euthymic mood, full affect   EKG:   The ekg ordered 2.21 demonstrates sinus rhythm, LBBB pattern    Recent Labs: 12/09/2018: Hemoglobin 12.3; Platelets 149 12/29/2018: BUN 18; Creatinine, Ser 1.38; Potassium 4.0; Sodium 142   Lipid Panel    Component Value Date/Time   CHOL 135 09/19/2016 1454  TRIG 168 (H) 09/19/2016 1454   HDL 42 09/19/2016 1454   CHOLHDL 3.2 09/19/2016 1454   VLDL 34 (H) 09/19/2016 1454   LDLCALC 59 09/19/2016 1454     Other studies Reviewed: Additional studies/ records that were reviewed today with results demonstrating: labs checked.   ASSESSMENT AND PLAN:  1. Chronic systolic heart failure: AICD in place.  Followed with electrophysiology. 2. CAD: Continue aggressive secondary prevention. 3. CRI: Will need to check electrolytes.  4. BiV pacer followed by EP.  BiV pacing 92% of the time as of 12/2018.  5. Check Dig level.  Anticoagulated for strokre prevention in the setting of AFib. 6. Avoid falls.    Current medicines are reviewed at length with the patient today.  The patient concerns regarding his medicines were addressed.  The following changes have been made:  No change  Labs/ tests ordered today include:  No orders of the defined types were placed in this encounter.   Recommend 150 minutes/week of aerobic exercise Low fat, low carb, high fiber diet recommended  Disposition:   FU in 6 months   Signed, Larae Grooms, MD  06/25/2019 1:36 PM    [facility] [address], [place], [place]  [address] Phone: [phone]; Fax: [phone]

## 2019-06-25 ENCOUNTER — Encounter: Payer: Self-pay | Admitting: Interventional Cardiology

## 2019-06-25 ENCOUNTER — Other Ambulatory Visit: Payer: Self-pay | Admitting: Emergency Medicine

## 2019-06-25 ENCOUNTER — Ambulatory Visit (INDEPENDENT_AMBULATORY_CARE_PROVIDER_SITE_OTHER): Payer: Medicare Other | Admitting: Interventional Cardiology

## 2019-06-25 ENCOUNTER — Other Ambulatory Visit: Payer: Self-pay

## 2019-06-25 VITALS — BP 130/64 | HR 66 | Ht 66.0 in | Wt 178.8 lb

## 2019-06-25 DIAGNOSIS — I5022 Chronic systolic (congestive) heart failure: Secondary | ICD-10-CM | POA: Diagnosis not present

## 2019-06-25 DIAGNOSIS — I779 Disorder of arteries and arterioles, unspecified: Secondary | ICD-10-CM

## 2019-06-25 DIAGNOSIS — I480 Paroxysmal atrial fibrillation: Secondary | ICD-10-CM | POA: Diagnosis not present

## 2019-06-25 DIAGNOSIS — I25119 Atherosclerotic heart disease of native coronary artery with unspecified angina pectoris: Secondary | ICD-10-CM

## 2019-06-25 DIAGNOSIS — I482 Chronic atrial fibrillation, unspecified: Secondary | ICD-10-CM

## 2019-06-25 MED ORDER — DIGOXIN 125 MCG PO TABS: 0.0625 mg | ORAL_TABLET | Freq: Every day | ORAL | 3 refills | Status: DC

## 2019-06-25 MED ORDER — FUROSEMIDE 40 MG PO TABS: ORAL_TABLET | ORAL | 3 refills | Status: DC

## 2019-06-25 NOTE — Patient Instructions (Signed)
Medication Instructions:  Your physician recommends that you continue on your current medications as directed. Please refer to the Current Medication list given to you today.  *If you need a refill on your cardiac medications before your next appointment, please call your pharmacy*   Lab Work: TODAY: BMET, DIG  If you have labs (blood work) drawn today and your tests are completely normal, you will receive your results only by: Marland Kitchen MyChart Message (if you have MyChart) OR . A paper copy in the mail If you have any lab test that is abnormal or we need to change your treatment, we will call you to review the results.   Testing/Procedures: None ordered  Follow-Up: At [facility], you and your health needs are our priority.  As part of our continuing mission to provide you with exceptional heart care, we have created designated Provider Care Teams.  These Care Teams include your primary Cardiologist (physician) and Advanced Practice Providers (APPs -  Physician Assistants and Nurse Practitioners) who all work together to provide you with the care you need, when you need it.  We recommend signing up for the patient portal called "MyChart".  Sign up information is provided on this After Visit Summary.  MyChart is used to connect with patients for Virtual Visits (Telemedicine).  Patients are able to view lab/test results, encounter notes, upcoming appointments, etc.  Non-urgent messages can be sent to your provider as well.   To learn more about what you can do with MyChart, go to [email or web address].    Your next appointment:   6 month(s)  The format for your next appointment:   In Person  Provider:   You may see Larae Grooms, MD or one of the following Advanced Practice Providers on your designated Care Team:    Melina Copa, PA-C  Ermalinda Barrios, PA-C    Other Instructions None

## 2019-06-26 LAB — BASIC METABOLIC PANEL
BUN/Creatinine Ratio: 17 (ref 10–24)
BUN: 28 mg/dL — ABNORMAL HIGH (ref 8–27)
CO2: 25 mmol/L (ref 20–29)
Calcium: 9 mg/dL (ref 8.6–10.2)
Chloride: 103 mmol/L (ref 96–106)
Creatinine, Ser: 1.68 mg/dL — ABNORMAL HIGH (ref 0.76–1.27)
GFR calc Af Amer: 44 mL/min/{1.73_m2} — ABNORMAL LOW (ref >59)
GFR calc non Af Amer: 38 mL/min/{1.73_m2} — ABNORMAL LOW (ref >59)
Glucose: 153 mg/dL — ABNORMAL HIGH (ref 65–99)
Potassium: 4.4 mmol/L (ref 3.5–5.2)
Sodium: 141 mmol/L (ref 134–144)

## 2019-06-26 LAB — DIGOXIN LEVEL: Digoxin, Serum: 0.9 ng/mL (ref 0.5–0.9)

## 2019-06-29 ENCOUNTER — Telehealth: Payer: Self-pay | Admitting: Interventional Cardiology

## 2019-06-29 NOTE — Telephone Encounter (Signed)
Troy [name] is returning Troy [name]'s call in regards to his lab results. Please advise.

## 2019-06-29 NOTE — Telephone Encounter (Signed)
Left message to call back  

## 2019-06-30 ENCOUNTER — Telehealth: Payer: Self-pay | Admitting: Interventional Cardiology

## 2019-06-30 NOTE — Telephone Encounter (Signed)
Left message for patient to call back. Will close this encounter as there is another phone note open.

## 2019-06-30 NOTE — Telephone Encounter (Signed)
Left message for patient to call back  

## 2019-06-30 NOTE — Telephone Encounter (Signed)
New Message  Pt called to receive the results from his lab work. Please call to discuss

## 2019-07-02 ENCOUNTER — Telehealth: Payer: Self-pay

## 2019-07-02 NOTE — Telephone Encounter (Signed)
Follow Up:      Pt is retuning  Your call from 06-30-19.

## 2019-07-02 NOTE — Telephone Encounter (Signed)
Left message for patient to call back  

## 2019-07-08 NOTE — Telephone Encounter (Signed)
See phone encounter from 6/8

## 2019-07-08 NOTE — Telephone Encounter (Signed)
Left message for patient to call back  

## 2019-07-12 ENCOUNTER — Telehealth: Payer: Self-pay | Admitting: Emergency Medicine

## 2019-07-12 NOTE — Telephone Encounter (Signed)
Patient returning phone call 

## 2019-07-12 NOTE — Telephone Encounter (Signed)
Left message to call back  

## 2019-07-13 MED ORDER — FUROSEMIDE 40 MG PO TABS: 40.0000 mg | ORAL_TABLET | Freq: Two times a day (BID) | ORAL | 3 refills | Status: DC

## 2019-07-13 NOTE — Telephone Encounter (Signed)
-----   Message from Jettie Booze, MD sent at 06/27/2019  4:00 PM EDT ----- Cr increased.  Go back to Lasix 40 mg BID, but if he feels extra fluid in legs, or increased shortness of breath, he can take 60 mg BID at that time,

## 2019-07-13 NOTE — Telephone Encounter (Signed)
Left message to call back  

## 2019-07-13 NOTE — Telephone Encounter (Signed)
Patient returning call.

## 2019-07-13 NOTE — Telephone Encounter (Signed)
The patient has been notified of the result and recommendations. Patient verbalized understanding.  All questions (if any) were answered. Cleon Gustin, RN 07/13/2019 2:06 PM

## 2019-07-13 NOTE — Telephone Encounter (Signed)
Cleon Gustin, RN  07/13/2019 2:04 PM EDT Back to Top    Patient notified of result and recommendations. Please refer to phone note from today for complete details.  Cleon Gustin, RN 07/13/2019 2:04 PM                            Jettie Booze, MD  06/27/2019 4:00 PM EDT     Cr increased. Go back to Lasix 40 mg BID, but if he feels extra fluid in legs, or increased shortness of breath, he can take 60 mg BID at that time,

## 2019-07-13 NOTE — Telephone Encounter (Signed)
Follow up ° ° °Patient is returning call for lab results. Please call. °

## 2019-07-16 ENCOUNTER — Other Ambulatory Visit: Payer: Self-pay | Admitting: Internal Medicine

## 2019-07-16 DIAGNOSIS — E782 Mixed hyperlipidemia: Secondary | ICD-10-CM

## 2019-07-16 DIAGNOSIS — Z8679 Personal history of other diseases of the circulatory system: Secondary | ICD-10-CM

## 2019-07-21 ENCOUNTER — Ambulatory Visit (INDEPENDENT_AMBULATORY_CARE_PROVIDER_SITE_OTHER): Payer: Medicare Other | Admitting: *Deleted

## 2019-07-21 DIAGNOSIS — I482 Chronic atrial fibrillation, unspecified: Secondary | ICD-10-CM

## 2019-07-21 DIAGNOSIS — Z5181 Encounter for therapeutic drug level monitoring: Secondary | ICD-10-CM | POA: Diagnosis not present

## 2019-07-21 DIAGNOSIS — J441 Chronic obstructive pulmonary disease with (acute) exacerbation: Secondary | ICD-10-CM | POA: Diagnosis not present

## 2019-07-21 LAB — POCT INR: INR: 3 (ref 2.0–3.0)

## 2019-07-21 NOTE — Patient Instructions (Signed)
Continue warfarin 2 tablets daily except 1 tablet on Wednesdays Eat extra greens/salad today  Recheck INR in 4 weeks

## 2019-08-03 ENCOUNTER — Telehealth: Payer: Self-pay | Admitting: Internal Medicine

## 2019-08-03 NOTE — Telephone Encounter (Signed)
Patient states that his phone has been disconnected from his device and he does not believe it is working. Will route to device team for assistance.

## 2019-08-03 NOTE — Telephone Encounter (Signed)
Troy [name] is calling requesting to speak with a nurse in regards to something they were wanting to put on his phone. Please advise.

## 2019-08-03 NOTE — Telephone Encounter (Signed)
I gave the pt verbal instructions. He states he do not know how to talk and go into the app at the same time. I told him to try to send the transmission manually and if he can not, I will schedule him with the device clinic to get help.

## 2019-08-03 NOTE — Telephone Encounter (Signed)
LMOVM for pt to return my call for assistance with monitor.

## 2019-08-05 NOTE — Telephone Encounter (Signed)
Pt could not get the app to work on his phone. I scheduled him an appoint for August 3rd.

## 2019-08-19 ENCOUNTER — Ambulatory Visit (INDEPENDENT_AMBULATORY_CARE_PROVIDER_SITE_OTHER): Payer: Medicare Other | Admitting: *Deleted

## 2019-08-19 DIAGNOSIS — I482 Chronic atrial fibrillation, unspecified: Secondary | ICD-10-CM | POA: Diagnosis not present

## 2019-08-19 DIAGNOSIS — Z5181 Encounter for therapeutic drug level monitoring: Secondary | ICD-10-CM | POA: Diagnosis not present

## 2019-08-19 LAB — POCT INR: INR: 2 (ref 2.0–3.0)

## 2019-08-19 NOTE — Patient Instructions (Signed)
Take warfarin 3 tablets tonight then resume 2 tablets daily except 1 tablet on Wednesdays Eat extra greens/salad today  Recheck INR in 5 weeks

## 2019-08-21 DIAGNOSIS — J441 Chronic obstructive pulmonary disease with (acute) exacerbation: Secondary | ICD-10-CM | POA: Diagnosis not present

## 2019-08-24 ENCOUNTER — Ambulatory Visit (INDEPENDENT_AMBULATORY_CARE_PROVIDER_SITE_OTHER): Payer: Medicare Other | Admitting: Emergency Medicine

## 2019-08-24 ENCOUNTER — Other Ambulatory Visit: Payer: Self-pay

## 2019-08-24 DIAGNOSIS — I5022 Chronic systolic (congestive) heart failure: Secondary | ICD-10-CM

## 2019-08-24 DIAGNOSIS — Z95810 Presence of automatic (implantable) cardiac defibrillator: Secondary | ICD-10-CM

## 2019-08-24 NOTE — Progress Notes (Signed)
Patient seen in clinic to assist with setting up [employer] app. App reinstalled successfully, monitor up to date. Patient aware that next transmission on 09/14/19 should be received automatically. Patient denies questions at this time.

## 2019-09-07 ENCOUNTER — Other Ambulatory Visit: Payer: Self-pay | Admitting: Interventional Cardiology

## 2019-09-14 ENCOUNTER — Ambulatory Visit (INDEPENDENT_AMBULATORY_CARE_PROVIDER_SITE_OTHER): Payer: Medicare Other | Admitting: *Deleted

## 2019-09-14 DIAGNOSIS — I429 Cardiomyopathy, unspecified: Secondary | ICD-10-CM

## 2019-09-14 DIAGNOSIS — I428 Other cardiomyopathies: Secondary | ICD-10-CM | POA: Diagnosis not present

## 2019-09-14 LAB — CUP PACEART REMOTE DEVICE CHECK
Battery Remaining Longevity: 79 mo
Battery Remaining Percentage: 88 %
Battery Voltage: 2.98 V
Brady Statistic AP VP Percent: 82 %
Brady Statistic AP VS Percent: 1 %
Brady Statistic AS VP Percent: 14 %
Brady Statistic AS VS Percent: 1 %
Brady Statistic RA Percent Paced: 75 %
Date Time Interrogation Session: 20210824020031
HighPow Impedance: 40 Ohm
Implantable Lead Implant Date: 20120801
Implantable Lead Implant Date: 20120801
Implantable Lead Implant Date: 20120801
Implantable Lead Location: 753858
Implantable Lead Location: 753859
Implantable Lead Location: 753860
Implantable Pulse Generator Implant Date: 20201123
Lead Channel Impedance Value: 360 Ohm
Lead Channel Impedance Value: 480 Ohm
Lead Channel Impedance Value: 750 Ohm
Lead Channel Pacing Threshold Amplitude: 0.625 V
Lead Channel Pacing Threshold Amplitude: 0.625 V
Lead Channel Pacing Threshold Amplitude: 1.125 V
Lead Channel Pacing Threshold Pulse Width: 0.5 ms
Lead Channel Pacing Threshold Pulse Width: 0.5 ms
Lead Channel Pacing Threshold Pulse Width: 0.5 ms
Lead Channel Sensing Intrinsic Amplitude: 5 mV
Lead Channel Sensing Intrinsic Amplitude: 5.7 mV
Lead Channel Setting Pacing Amplitude: 1.625
Lead Channel Setting Pacing Amplitude: 1.625
Lead Channel Setting Pacing Amplitude: 2 V
Lead Channel Setting Pacing Pulse Width: 0.5 ms
Lead Channel Setting Pacing Pulse Width: 0.5 ms
Lead Channel Setting Sensing Sensitivity: 0.5 mV
Pulse Gen Serial Number: 111012928

## 2019-09-20 NOTE — Progress Notes (Signed)
Remote ICD transmission.   

## 2019-09-21 DIAGNOSIS — J441 Chronic obstructive pulmonary disease with (acute) exacerbation: Secondary | ICD-10-CM | POA: Diagnosis not present

## 2019-09-23 ENCOUNTER — Other Ambulatory Visit: Payer: Self-pay

## 2019-09-23 ENCOUNTER — Ambulatory Visit (INDEPENDENT_AMBULATORY_CARE_PROVIDER_SITE_OTHER): Payer: Medicare Other | Admitting: Pharmacist

## 2019-09-23 DIAGNOSIS — Z5181 Encounter for therapeutic drug level monitoring: Secondary | ICD-10-CM | POA: Diagnosis not present

## 2019-09-23 DIAGNOSIS — I482 Chronic atrial fibrillation, unspecified: Secondary | ICD-10-CM | POA: Diagnosis not present

## 2019-09-23 LAB — POCT INR: INR: 2.5 (ref 2.0–3.0)

## 2019-09-23 NOTE — Patient Instructions (Addendum)
Description   Take 2 tablets daily except 1 tablet on Wednesday.  Keep your greens consistent.  Recheck INR in 6 weeks

## 2019-09-28 ENCOUNTER — Other Ambulatory Visit: Payer: Self-pay | Admitting: Emergency Medicine

## 2019-10-07 DIAGNOSIS — M109 Gout, unspecified: Secondary | ICD-10-CM | POA: Diagnosis not present

## 2019-10-07 DIAGNOSIS — I5020 Unspecified systolic (congestive) heart failure: Secondary | ICD-10-CM | POA: Diagnosis not present

## 2019-10-07 DIAGNOSIS — M545 Low back pain: Secondary | ICD-10-CM | POA: Diagnosis not present

## 2019-10-07 DIAGNOSIS — J440 Chronic obstructive pulmonary disease with acute lower respiratory infection: Secondary | ICD-10-CM | POA: Diagnosis not present

## 2019-10-13 ENCOUNTER — Other Ambulatory Visit: Payer: Self-pay | Admitting: Internal Medicine

## 2019-10-13 NOTE — Telephone Encounter (Signed)
This is a [place] pt °

## 2019-10-14 DIAGNOSIS — Z743 Need for continuous supervision: Secondary | ICD-10-CM | POA: Diagnosis not present

## 2019-10-14 DIAGNOSIS — R944 Abnormal results of kidney function studies: Secondary | ICD-10-CM | POA: Diagnosis not present

## 2019-10-14 DIAGNOSIS — D72829 Elevated white blood cell count, unspecified: Secondary | ICD-10-CM | POA: Diagnosis not present

## 2019-10-14 DIAGNOSIS — R0902 Hypoxemia: Secondary | ICD-10-CM | POA: Diagnosis not present

## 2019-10-14 DIAGNOSIS — R790 Abnormal level of blood mineral: Secondary | ICD-10-CM | POA: Diagnosis not present

## 2019-10-14 DIAGNOSIS — I700 Atherosclerosis of aorta: Secondary | ICD-10-CM | POA: Diagnosis not present

## 2019-10-14 DIAGNOSIS — R0602 Shortness of breath: Secondary | ICD-10-CM | POA: Diagnosis not present

## 2019-10-14 DIAGNOSIS — R069 Unspecified abnormalities of breathing: Secondary | ICD-10-CM | POA: Diagnosis not present

## 2019-10-14 DIAGNOSIS — R7989 Other specified abnormal findings of blood chemistry: Secondary | ICD-10-CM | POA: Diagnosis not present

## 2019-10-14 DIAGNOSIS — J441 Chronic obstructive pulmonary disease with (acute) exacerbation: Secondary | ICD-10-CM | POA: Diagnosis not present

## 2019-10-14 DIAGNOSIS — R9431 Abnormal electrocardiogram [ECG] [EKG]: Secondary | ICD-10-CM | POA: Diagnosis not present

## 2019-10-14 DIAGNOSIS — R0789 Other chest pain: Secondary | ICD-10-CM | POA: Diagnosis not present

## 2019-10-14 NOTE — Telephone Encounter (Signed)
Refill sent in

## 2019-10-21 ENCOUNTER — Telehealth: Payer: Self-pay | Admitting: Interventional Cardiology

## 2019-10-21 ENCOUNTER — Telehealth: Payer: Self-pay | Admitting: Emergency Medicine

## 2019-10-21 DIAGNOSIS — J441 Chronic obstructive pulmonary disease with (acute) exacerbation: Secondary | ICD-10-CM | POA: Diagnosis not present

## 2019-10-21 MED ORDER — BENZONATATE 200 MG PO CAPS: 200.0000 mg | ORAL_CAPSULE | Freq: Four times a day (QID) | ORAL | 1 refills | Status: DC | PRN

## 2019-10-21 MED ORDER — PREDNISONE 10 MG PO TABS: ORAL_TABLET | ORAL | 0 refills | Status: DC

## 2019-10-21 MED ORDER — LEVOFLOXACIN 500 MG PO TABS: 500.0000 mg | ORAL_TABLET | Freq: Every day | ORAL | 0 refills | Status: DC

## 2019-10-21 NOTE — Telephone Encounter (Signed)
Primary Pulmonologist: Dr.Byrum Last office visit and with whom: 04/12/19 Dr.Byrum  What do we see them for (pulmonary problems):    Last OV assessment/plan:  Assessment & Plan Note by Collene Gobble, MD at 04/12/2019 12:02 PM Author: Collene Gobble, MD Author Type: Physician Filed: 04/12/2019 12:02 PM  Note Status: Written Cosign: Cosign Not Required Encounter Date: 04/12/2019  Problem: Allergic rhinitis  Editor: Collene Gobble, MD (Physician)               Continue Flonase, Zyrtec as ordered.  Good compliance    Assessment & Plan Note by Collene Gobble, MD at 04/12/2019 12:01 PM Author: Collene Gobble, MD Author Type: Physician Filed: 04/12/2019 12:01 PM  Note Status: Written Cosign: Cosign Not Required Encounter Date: 04/12/2019  Problem: Chronic respiratory failure with hypoxia [facility])  Editor: Collene Gobble, MD (Physician)               He uses oxygen as needed.  Probably truncates his use due to the weight of his portable tanks.  I will try to qualify him today for POC and order if he does qualify.  He would like to change from [employer] to an alternative DME.    Assessment & Plan Note by Collene Gobble, MD at 04/12/2019 12:00 PM Author: Collene Gobble, MD Author Type: Physician Filed: 04/12/2019 12:00 PM  Note Status: Written Cosign: Cosign Not Required Encounter Date: 04/12/2019  Problem: COPD (chronic obstructive pulmonary disease) [facility])  Editor: Collene Gobble, MD (Physician)               Severe disease.  No flares.  May have lost some functional capacity with deconditioning and isolation.  No change in mucus burden.  Overall I think he is stable.  Continue Trelegy, albuterol as needed.    Patient Instructions by Collene Gobble, MD at 04/12/2019 11:45 AM Author: Collene Gobble, MD Author Type: Physician Filed: 04/12/2019 12:00 PM  Note Status: Addendum Cosign: Cosign Not Required Encounter Date: 04/12/2019  Editor: Collene Gobble, MD (Physician)      Prior Versions: 1.  Collene Gobble, MD (Physician) at 04/12/2019 11:59 AM - Signed    Please continue Trelegy once daily as you have been taking it.  Rinse and gargle after using. Keep your albuterol available to use 1 nebulizer treatment up to every 4 hours if needed for shortness of breath, chest tightness, wheezing. Please continue your Flonase and Zyrtec as you have been taking them. Walking oximetry today.  If you qualify we will try to order a portable oxygen concentrator so you can wear your oxygen more reliably with exertion. Get your second COVID-19 vaccine as planned. Follow with Dr Lamonte Sakai in 6 months or sooner if you have any problems     Instructions  Please continue Trelegy once daily as you have been taking it.  Rinse and gargle after using. Keep your albuterol available to use 1 nebulizer treatment up to every 4 hours if needed for shortness of breath, chest tightness, wheezing. Please continue your Flonase and Zyrtec as you have been taking them. Walking oximetry today.  If you qualify we will try to order a portable oxygen concentrator so you can wear your oxygen more reliably with exertion. Get your second COVID-19 vaccine as planned. Follow with Dr Lamonte Sakai in 6 months or sooner if you have any problems         After Visit Summary (Printed 04/12/2019) Communications  [facility] Provider CC Chart Rep sent to Neale Burly, MD Media From this encounter Electronic signature on 04/12/2019 11:26 AM - E-signed  Communication Routing History  Recipient Method Sent by Date Sent  Neale Burly, MD Fax Collene Gobble, MD 04/12/2019  Fax: [phone]  Phone: [phone]   No questionnaires available.           Orders Placed   None Medication Changes    None   Medication List  Visit Diagnoses    Chronic obstructive pulmonary disease, unspecified COPD type ([place])   Chronic respiratory failure with hypoxia ([place])   Allergic rhinitis due to pollen, unspecified seasonality    Problem List  Level of Service  Level of Service  PR OFFICE/OUTPATIENT ESTABLISHED LOW MDM 20-29 MIN [29562]  All Charges for This Encounter  Code Description Service Date Service Provider Modifiers Qty  99213 PR OFFICE/OUTPATIENT ESTABLISHED LOW MDM 20-29 MIN 04/12/2019 Collene Gobble, MD       Was appointment offered to patient (explain)?    Reason for call: Cough,Thick mucus for over a week. Patient stated he is done with prednisone and 1 more of antibiotics.Patient would like something to help out with his cough sent into his pharmacy.  Dr.Byrum can you please advise.  Thank you    Allergies  Allergen Reactions  . Benadryl [Diphenhydramine Hcl] Nausea And Vomiting    Immunization History  Administered Date(s) Administered  . Influenza Split 10/21/2013  . Influenza, High Dose Seasonal PF 11/10/2015, 10/21/2017  . Influenza,inj,Quad PF,6+ Mos 10/21/2012, 12/27/2014, 10/22/2016  . Pneumococcal Conjugate-13 10/22/2016  . Pneumococcal Polysaccharide-23 12/28/2014, 10/21/2017

## 2019-10-21 NOTE — Telephone Encounter (Signed)
Attempted to call pt but unable to reach. Left message for him to return call. °

## 2019-10-21 NOTE — Telephone Encounter (Signed)
Patient states he is having problems with his COPD and wants to speak with a nurse.

## 2019-10-21 NOTE — Telephone Encounter (Signed)
I think he needs a longer pred taper, alternate abx:   - prednisone Take 40mg  daily for 3 days, then 30mg  daily for 3 days, then 20mg  daily for 3 days, then 10mg  daily for 3 days, then stop - levaquin 500mg  qd x 7 days - tessalon perles 200mg  q6h prn cough - OTC delsym - start mucinex 600mg  bid if he is not already taking.

## 2019-10-21 NOTE — Telephone Encounter (Signed)
Spoke with the patient who states that he has a bad cough. He states that his COPS has been worsening. He was given antibiotics and prednisone but he does not think that it has helped much. Patient denies any chest pain. Advised patient that he should call his pulmonologist. Patient verbalized understanding.

## 2019-10-21 NOTE — Telephone Encounter (Signed)
Patient returning call.  [phone]

## 2019-10-21 NOTE — Telephone Encounter (Signed)
Called and spoke with pt letting him know the info stated by RB and that we were going to send pred taper to pharmacy for him, levaquin abx, and also tessalon. Pt verbalized understanding. Verified preferred pharmacy and sent meds in for pt. Nothing further needed.

## 2019-10-26 ENCOUNTER — Telehealth: Payer: Self-pay | Admitting: Emergency Medicine

## 2019-10-26 NOTE — Telephone Encounter (Signed)
Spoke with the pt  He states returning call to someone bu unsure why  I do not see that a call was placed to him  Looks like he is due for appt with RB and there was a 6 month recall in the system  Scheduled him for 11/23/19 Nothing further needed

## 2019-10-29 ENCOUNTER — Other Ambulatory Visit: Payer: Self-pay | Admitting: Emergency Medicine

## 2019-11-02 ENCOUNTER — Ambulatory Visit (INDEPENDENT_AMBULATORY_CARE_PROVIDER_SITE_OTHER): Payer: Medicare Other | Admitting: *Deleted

## 2019-11-02 DIAGNOSIS — Z5181 Encounter for therapeutic drug level monitoring: Secondary | ICD-10-CM | POA: Diagnosis not present

## 2019-11-02 DIAGNOSIS — I482 Chronic atrial fibrillation, unspecified: Secondary | ICD-10-CM

## 2019-11-02 LAB — POCT INR: INR: 5 — AB (ref 2.0–3.0)

## 2019-11-02 NOTE — Patient Instructions (Signed)
Hold warfarin tonight and tomorrow night, take 1 tablet on Thursday then resume 2 tablets daily except 1 tablet on Wednesday.  Keep your greens consistent. Has 2 prednisone tablets left  Recheck INR in 1 week

## 2019-11-03 ENCOUNTER — Telehealth: Payer: Self-pay

## 2019-11-03 NOTE — Telephone Encounter (Signed)
[facility] alert received- AF burden alert. Known PAF on [place]. Presenting shows ongoing AF.    Pt has known history of PAF, although it appears AF burden increasing with elevated Vrates.  Current medications include Carvedilol 12.5mg  BID, Digoxin 0.0625mg  daily, Furosemide 40mg  BID, Potassium 30 mEq daily.  Coumadin as directed- Per anti-coag encounter yesterday INR level currently supratherapeutic.    Spoke with pt, he reports he has not been feeling well lately.  Yesterday and today in particular he has felt "rough", low energy lately.   He does have a cough that he associates with recent COPD exacerbation.  Pt confirmed compliance with medications.    In discussion pt reports he has never been to AF clinic but would like to have consult to see if they can help with how he is feeling if it is associated with the AF.

## 2019-11-03 NOTE — Telephone Encounter (Signed)
Called and spoke with patient.  He is aware of appt 11/05/19 at 11:30 am with Adline Peals, PA.

## 2019-11-05 ENCOUNTER — Encounter (HOSPITAL_COMMUNITY): Payer: Self-pay | Admitting: Physician Assistant

## 2019-11-05 ENCOUNTER — Other Ambulatory Visit: Payer: Self-pay

## 2019-11-05 ENCOUNTER — Ambulatory Visit (HOSPITAL_COMMUNITY)
Admission: RE | Admit: 2019-11-05 | Discharge: 2019-11-05 | Disposition: A | Discharge status: Home / Self Care | Payer: Medicare Other | Source: Ambulatory Visit | Attending: Physician Assistant | Admitting: Physician Assistant

## 2019-11-05 VITALS — BP 150/68 | HR 64 | Ht 66.0 in | Wt 178.0 lb

## 2019-11-05 DIAGNOSIS — I480 Paroxysmal atrial fibrillation: Secondary | ICD-10-CM | POA: Diagnosis not present

## 2019-11-05 DIAGNOSIS — Z86718 Personal history of other venous thrombosis and embolism: Secondary | ICD-10-CM | POA: Diagnosis not present

## 2019-11-05 DIAGNOSIS — K219 Gastro-esophageal reflux disease without esophagitis: Secondary | ICD-10-CM | POA: Diagnosis not present

## 2019-11-05 DIAGNOSIS — Z9981 Dependence on supplemental oxygen: Secondary | ICD-10-CM | POA: Diagnosis not present

## 2019-11-05 DIAGNOSIS — I2510 Atherosclerotic heart disease of native coronary artery without angina pectoris: Secondary | ICD-10-CM | POA: Diagnosis not present

## 2019-11-05 DIAGNOSIS — I5022 Chronic systolic (congestive) heart failure: Secondary | ICD-10-CM | POA: Diagnosis not present

## 2019-11-05 DIAGNOSIS — Z87891 Personal history of nicotine dependence: Secondary | ICD-10-CM | POA: Diagnosis not present

## 2019-11-05 DIAGNOSIS — I272 Pulmonary hypertension, unspecified: Secondary | ICD-10-CM | POA: Diagnosis not present

## 2019-11-05 DIAGNOSIS — Z950 Presence of cardiac pacemaker: Secondary | ICD-10-CM | POA: Diagnosis not present

## 2019-11-05 DIAGNOSIS — I110 Hypertensive heart disease with heart failure: Secondary | ICD-10-CM | POA: Diagnosis not present

## 2019-11-05 DIAGNOSIS — D6869 Other thrombophilia: Secondary | ICD-10-CM | POA: Diagnosis not present

## 2019-11-05 DIAGNOSIS — Z7901 Long term (current) use of anticoagulants: Secondary | ICD-10-CM | POA: Diagnosis not present

## 2019-11-05 DIAGNOSIS — J449 Chronic obstructive pulmonary disease, unspecified: Secondary | ICD-10-CM | POA: Insufficient documentation

## 2019-11-05 DIAGNOSIS — Z79899 Other long term (current) drug therapy: Secondary | ICD-10-CM | POA: Diagnosis not present

## 2019-11-05 NOTE — Progress Notes (Signed)
Primary Care Physician: Troy [name], No Pcp Per Primary Cardiologist: Dr Irish Lack  Primary Electrophysiologist: Dr Lovena Le Referring Physician: Dr Zara [name] is a 79 y.o. male with a history of CHB, CAD, COPD, chronic systolic CHF, DM, HTN, and paroxysmal atrial fibrillation who presents for follow up in the [facility]. Troy [name] is on warfarin for a CHADS2VASC score of 6. Troy [name] reports that in September of this year, he was diagnosed with community acquired PNA as well as a COPD exacerbation. He has been on several rounds of antibiotics and steroids. The device clinic received an alert for an increased afib burden over the same time period. Troy [name] does have palpitations at times but is otherwise unaware of his arrhythmia. He denies any bleeding issues on anticoagulation.   Today, he denies symptoms of chest pain, orthopnea, PND, lower extremity edema, dizziness, presyncope, syncope, snoring, daytime somnolence, bleeding, or neurologic sequela. The Troy [name] is tolerating medications without difficulties and is otherwise without complaint today.    Atrial Fibrillation Risk Factors:  he does not have symptoms or diagnosis of sleep apnea. he does not have a history of rheumatic fever.   he has a BMI of Body mass index is 28.73 kg/m.Marland Kitchen Filed Weights   11/05/19 1129  Weight: 80.7 kg    Family History  Problem Relation Age of Onset  . Heart disease Father   . Cancer Father        LUNG  . Colon cancer Mother   . Stroke Paternal Uncle   . Heart attack Neg Hx   . Hyperlipidemia Neg Hx   . Hypertension Neg Hx      Atrial Fibrillation Management history:  Previous antiarrhythmic drugs: none Previous cardioversions: none Previous ablations: none CHADS2VASC score: 6 Anticoagulation history: warfarin    Past Medical History:  Diagnosis Date  . Atrial fibrillation ([place])   . CAD (coronary artery disease) 1996   status post PCI of the RCA     . Community acquired pneumonia 08/24/2015  . Congestive heart failure, unspecified   . COPD (chronic obstructive pulmonary disease) ([facility])    Pt on home O2 at night and PRN, unsure of date of diagnosis.  . Diabetes mellitus, type 2 ([place])   . DJD (degenerative joint disease)   . GERD (gastroesophageal reflux disease)   . Gout   . HTN (hypertension)   . 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 Nephrolithiasis   . Other primary cardiomyopathies   . Personal history of DVT (deep vein thrombosis)   . Prostatitis   . Shingles    Past Surgical History:  Procedure Laterality Date  . BACK SURGERY    . BIV ICD GENERATOR CHANGEOUT N/A 12/14/2018   Procedure: BIV ICD GENERATOR CHANGEOUT;  Surgeon: Evans Lance, MD;  Location: [facility];  Service: Cardiovascular;  Laterality: N/A;  . CARDIAC DEFIBRILLATOR PLACEMENT    . CORONARY STENT PLACEMENT    . PACEMAKER INSERTION      Current Outpatient Medications  Medication Sig Dispense Refill  . acetaminophen (TYLENOL) 500 MG tablet Take 1,000 mg by mouth every 6 (six) hours as needed for moderate pain.     Marland Kitchen albuterol (ACCUNEB) 1.25 MG/3ML nebulizer solution INHALE CONTENTS OF 1 VIAL IN NEBULIZER EVERY 6 HOURS AS NEEDED. 360 mL 5  . albuterol (VENTOLIN HFA) 108 (90 Base) MCG/ACT inhaler INHALE 1-2 PUFFS INTO THE LUNGS EVERY 6 HOURS AS NEEDED FOR WHEEZING OR SHORTNESS OF BREATH 8.5 g  5  . alfuzosin (UROXATRAL) 10 MG 24 hr tablet Take 10 mg by mouth daily.    Marland Kitchen allopurinol (ZYLOPRIM) 100 MG tablet Take 1 tablet (100 mg total) daily by mouth. 90 tablet 0  . atorvastatin (LIPITOR) 10 MG tablet TAKE ONE TABLET BY MOUTH DAILY 30 tablet 11  . benzonatate (TESSALON) 200 MG capsule Take 1 capsule (200 mg total) by mouth every 6 (six) hours as needed for cough. 30 capsule 1  . carvedilol (COREG) 12.5 MG tablet TAKE ONE TABLET BY MOUTH TWICE DAILY. TAKE WITH A MEAL. 180 tablet 2  . cetirizine (ZYRTEC) 10 MG tablet Take 10 mg by mouth daily.    .  digoxin (LANOXIN) 0.125 MG tablet Take 0.5 tablets (0.0625 mg total) by mouth daily. 45 tablet 3  . fluticasone (FLONASE) 50 MCG/ACT nasal spray Place 2 sprays into both nostrils at bedtime as needed for allergies. 16 g 3  . furosemide (LASIX) 40 MG tablet Take 1 tablet (40 mg total) by mouth 2 (two) times daily. 180 tablet 3  . potassium chloride SA (KLOR-CON) 20 MEQ tablet Take 1.5 tablets (30 mEq total) by mouth daily. 135 tablet 3  . TRELEGY ELLIPTA 100-62.5-25 MCG/INH AEPB INHALE ONE PUFF INTO THE LUNGS DAILY 60 each 2  . triamcinolone cream (KENALOG) 0.1 % Apply topically 2 (two) times daily. to affected area    . warfarin (COUMADIN) 1 MG tablet TAKE TWO (2) TABLETS BY MOUTH EVERY DAY OR USE AS DIRECTED 65 tablet 6   No current facility-administered medications for this encounter.    Allergies  Allergen Reactions  . Benadryl [Diphenhydramine Hcl] Nausea And Vomiting    Social History   Socioeconomic History  . Marital status: Single    Spouse name: Not on file  . Number of children: 0  . Years of education: Not on file  . Highest education level: Not on file  Occupational History  . Occupation: Retired from Chief of Staff  . Smoking status: Former Smoker    Packs/day: 4.00    Years: 50.00    Pack years: 200.00    Types: Cigarettes    Quit date: 01/22/1980    Years since quitting: 39.8  . Smokeless tobacco: Never Used  Vaping Use  . Vaping Use: Never used  Substance and Sexual Activity  . Alcohol use: No    Alcohol/week: 0.0 standard drinks    Comment: denies  . Drug use: No  . Sexual activity: Never  Other Topics Concern  . Not on file  Social History Narrative   Lives alone.  Single.  No children.  Ambulates with a cane.  Has a deceased brother and is estranged from sisters.   Social Determinants of Health   Financial Resource Strain:   . Difficulty of Paying Living Expenses: Not on file  Food Insecurity:   . Worried About Charity fundraiser  in the Last Year: Not on file  . Ran Out of Food in the Last Year: Not on file  Transportation Needs:   . Lack of Transportation (Medical): Not on file  . Lack of Transportation (Non-Medical): Not on file  Physical Activity:   . Days of Exercise per Week: Not on file  . Minutes of Exercise per Session: Not on file  Stress:   . Feeling of Stress : Not on file  Social Connections:   . Frequency of Communication with Friends and Family: Not on file  . Frequency of Social Gatherings with Friends  and Family: Not on file  . Attends Religious Services: Not on file  . Active Member of Clubs or Organizations: Not on file  . Attends Archivist Meetings: Not on file  . Marital Status: Not on file  Intimate Partner Violence:   . Fear of Current or Ex-Partner: Not on file  . Emotionally Abused: Not on file  . Physically Abused: Not on file  . Sexually Abused: Not on file     ROS- All systems are reviewed and negative except as per the HPI above.  Physical Exam: Vitals:   11/05/19 1129  BP: (!) 150/68  Pulse: 64  Weight: 80.7 kg  Height: 5\' 6"  (1.676 m)    GEN- The Troy [name] is well appearing elderly male, alert and oriented x 3 today.   Head- normocephalic, atraumatic Eyes-  Sclera clear, conjunctiva pink Ears- hearing intact Oropharynx- clear Neck- supple  Lungs- Diminished breath sounds bilaterally, normal work of breathing Heart- Regular rate and rhythm, no murmurs, rubs or gallops  GI- soft, NT, ND, + BS Extremities- no clubbing, cyanosis, or edema MS- no significant deformity or atrophy Skin- no rash or lesion Psych- euthymic mood, full affect Neuro- strength and sensation are intact  Wt Readings from Last 3 Encounters:  11/05/19 80.7 kg  06/25/19 81.1 kg  04/12/19 80.7 kg    EKG today demonstrates AV dual paced rhythm HR 64, PR 186, QRS 186, QTc 443  Echo 02/01/17 demonstrated  - Left ventricle: The cavity size was moderately dilated. Wall  thickness was  normal. Systolic function was severely reduced. The  estimated ejection fraction was in the range of 25% to 30%.  Diffuse hypokinesis. There is akinesis of the inferolateral  myocardium. Doppler parameters are consistent with abnormal left  ventricular relaxation (grade 1 diastolic dysfunction).  - Aortic valve: There was mild regurgitation.  - Aortic root: The aortic root was mildly dilated.  - Mitral valve: Calcified annulus. There was mild regurgitation.  - Left atrium: The atrium was mildly dilated.  - Pulmonary arteries: Systolic pressure was mildly to moderately  increased.   Impressions:   - Definity used; global hypokinesis with akinesis of the  inferolateral wall; overall severely reduced LV function;  moderate LVE; mild diastolic dysfunction; mild AI and MR; mildly  dilated aortic root; mild LAE; mild TR with mild to moderate  pulmonary hypertension.   Epic records are reviewed at length today  CHA2DS2-VASc Score = 6  The Troy [name]'s score is based upon: CHF History: 1 HTN History: 1 Diabetes History: 1 Stroke History: 0 Vascular Disease History: 1 Age Score: 2 Gender Score: 0      ASSESSMENT AND PLAN: 1. Paroxysmal Atrial Fibrillation (ICD10:  I48.0) The Troy [name]'s CHA2DS2-VASc score is 6, indicating a 9.7% annual risk of stroke.   Suspect 2/2 recent infection, abx, and steroids.  He is in SR today. Will continue present therapy for now. Could consider dofetilide if his afib becomes more persistent.  Continue Coreg 12.5 mg BID Continue digoxin 0.0625 mg daily Continue warfarin   2. Secondary Hypercoagulable State (ICD10:  D68.69) The Troy [name] is at significant risk for stroke/thromboembolism based upon his CHA2DS2-VASc Score of 6.  Continue Warfarin (Coumadin).   3. CAD No anginal symptoms.  4. HTN Stable, no changes today.  5. Chronic systolic CHF No signs or symptoms of fluid overload.    Follow up with Dr Irish Lack as scheduled.     [facility] [address]  [place], [place] [address] [phone] 11/05/2019 12:02 PM

## 2019-11-09 ENCOUNTER — Ambulatory Visit (INDEPENDENT_AMBULATORY_CARE_PROVIDER_SITE_OTHER): Payer: Medicare Other | Admitting: *Deleted

## 2019-11-09 DIAGNOSIS — Z5181 Encounter for therapeutic drug level monitoring: Secondary | ICD-10-CM

## 2019-11-09 DIAGNOSIS — I482 Chronic atrial fibrillation, unspecified: Secondary | ICD-10-CM

## 2019-11-09 LAB — POCT INR: INR: 2.1 (ref 2.0–3.0)

## 2019-11-09 NOTE — Patient Instructions (Signed)
Continue warfarin 2 tablets daily except 1 tablet on Wednesday.  Keep your greens consistent.  Recheck INR in 4 weeks

## 2019-11-10 DIAGNOSIS — I517 Cardiomegaly: Secondary | ICD-10-CM | POA: Diagnosis not present

## 2019-11-10 DIAGNOSIS — I482 Chronic atrial fibrillation, unspecified: Secondary | ICD-10-CM | POA: Diagnosis not present

## 2019-11-10 DIAGNOSIS — J9811 Atelectasis: Secondary | ICD-10-CM | POA: Diagnosis not present

## 2019-11-10 DIAGNOSIS — E785 Hyperlipidemia, unspecified: Secondary | ICD-10-CM | POA: Diagnosis not present

## 2019-11-10 DIAGNOSIS — I10 Essential (primary) hypertension: Secondary | ICD-10-CM | POA: Diagnosis not present

## 2019-11-10 DIAGNOSIS — Z8673 Personal history of transient ischemic attack (TIA), and cerebral infarction without residual deficits: Secondary | ICD-10-CM | POA: Diagnosis not present

## 2019-11-10 DIAGNOSIS — Z95810 Presence of automatic (implantable) cardiac defibrillator: Secondary | ICD-10-CM | POA: Diagnosis not present

## 2019-11-10 DIAGNOSIS — I351 Nonrheumatic aortic (valve) insufficiency: Secondary | ICD-10-CM | POA: Diagnosis not present

## 2019-11-10 DIAGNOSIS — R0602 Shortness of breath: Secondary | ICD-10-CM | POA: Diagnosis not present

## 2019-11-10 DIAGNOSIS — E7800 Pure hypercholesterolemia, unspecified: Secondary | ICD-10-CM | POA: Diagnosis not present

## 2019-11-10 DIAGNOSIS — J441 Chronic obstructive pulmonary disease with (acute) exacerbation: Secondary | ICD-10-CM | POA: Diagnosis not present

## 2019-11-10 DIAGNOSIS — R778 Other specified abnormalities of plasma proteins: Secondary | ICD-10-CM | POA: Diagnosis not present

## 2019-11-10 DIAGNOSIS — I5023 Acute on chronic systolic (congestive) heart failure: Secondary | ICD-10-CM | POA: Diagnosis not present

## 2019-11-10 DIAGNOSIS — I509 Heart failure, unspecified: Secondary | ICD-10-CM | POA: Diagnosis not present

## 2019-11-10 DIAGNOSIS — R7989 Other specified abnormal findings of blood chemistry: Secondary | ICD-10-CM | POA: Diagnosis not present

## 2019-11-10 DIAGNOSIS — Z602 Problems related to living alone: Secondary | ICD-10-CM | POA: Diagnosis not present

## 2019-11-10 DIAGNOSIS — Z87891 Personal history of nicotine dependence: Secondary | ICD-10-CM | POA: Diagnosis not present

## 2019-11-10 DIAGNOSIS — I480 Paroxysmal atrial fibrillation: Secondary | ICD-10-CM | POA: Diagnosis not present

## 2019-11-10 DIAGNOSIS — Z9981 Dependence on supplemental oxygen: Secondary | ICD-10-CM | POA: Diagnosis not present

## 2019-11-10 DIAGNOSIS — Z7901 Long term (current) use of anticoagulants: Secondary | ICD-10-CM | POA: Diagnosis not present

## 2019-11-10 DIAGNOSIS — M109 Gout, unspecified: Secondary | ICD-10-CM | POA: Diagnosis not present

## 2019-11-10 DIAGNOSIS — J449 Chronic obstructive pulmonary disease, unspecified: Secondary | ICD-10-CM | POA: Diagnosis not present

## 2019-11-10 DIAGNOSIS — I255 Ischemic cardiomyopathy: Secondary | ICD-10-CM | POA: Diagnosis not present

## 2019-11-10 DIAGNOSIS — R54 Age-related physical debility: Secondary | ICD-10-CM | POA: Diagnosis not present

## 2019-11-10 DIAGNOSIS — I5189 Other ill-defined heart diseases: Secondary | ICD-10-CM | POA: Diagnosis not present

## 2019-11-21 DIAGNOSIS — J441 Chronic obstructive pulmonary disease with (acute) exacerbation: Secondary | ICD-10-CM | POA: Diagnosis not present

## 2019-11-23 ENCOUNTER — Ambulatory Visit: Payer: Medicare Other | Admitting: Internal Medicine

## 2019-12-07 ENCOUNTER — Ambulatory Visit (INDEPENDENT_AMBULATORY_CARE_PROVIDER_SITE_OTHER): Payer: Medicare Other | Admitting: *Deleted

## 2019-12-07 DIAGNOSIS — Z5181 Encounter for therapeutic drug level monitoring: Secondary | ICD-10-CM

## 2019-12-07 DIAGNOSIS — I482 Chronic atrial fibrillation, unspecified: Secondary | ICD-10-CM

## 2019-12-07 LAB — POCT INR: INR: 2.6 (ref 2.0–3.0)

## 2019-12-07 NOTE — Patient Instructions (Signed)
Continue warfarin 2 tablets daily except 1 tablet on Wednesday.  Keep your greens consistent.  Recheck INR in 4 weeks

## 2019-12-21 ENCOUNTER — Ambulatory Visit: Payer: Medicare Other | Admitting: Internal Medicine

## 2019-12-21 DIAGNOSIS — J441 Chronic obstructive pulmonary disease with (acute) exacerbation: Secondary | ICD-10-CM | POA: Diagnosis not present

## 2019-12-21 NOTE — Progress Notes (Deleted)
   Troy [name], male    DOB: [birth year],    MRN: [id number]   Brief patient profile:      History of Present Illness  12/21/2019  Pulmonary/ 1st office eval/ [name] / [facility]  No chief complaint on file.    Dyspnea:  *** Cough: *** Sleep: *** SABA use:   Past Medical History:  Diagnosis Date  . Atrial fibrillation ([place])   . CAD (coronary artery disease) 1996   status post PCI of the RCA   . Community acquired pneumonia 08/24/2015  . Congestive heart failure, unspecified   . COPD (chronic obstructive pulmonary disease) (HCC)    Pt on home O2 at night and PRN, unsure of date of diagnosis.  . Diabetes mellitus, type 2 ([place])   . DJD (degenerative joint disease)   . GERD (gastroesophageal reflux disease)   . Gout   . HTN (hypertension)   . 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 Nephrolithiasis   . Other primary cardiomyopathies   . Personal history of DVT (deep vein thrombosis)   . Prostatitis   . Shingles     Outpatient Medications Prior to Visit  Medication Sig Dispense Refill  . acetaminophen (TYLENOL) 500 MG tablet Take 1,000 mg by mouth every 6 (six) hours as needed for moderate pain.     Marland Kitchen albuterol (ACCUNEB) 1.25 MG/3ML nebulizer solution INHALE CONTENTS OF 1 VIAL IN NEBULIZER EVERY 6 HOURS AS NEEDED. 360 mL 5  . albuterol (VENTOLIN HFA) 108 (90 Base) MCG/ACT inhaler INHALE 1-2 PUFFS INTO THE LUNGS EVERY 6 HOURS AS NEEDED FOR WHEEZING OR SHORTNESS OF BREATH 8.5 g 5  . alfuzosin (UROXATRAL) 10 MG 24 hr tablet Take 10 mg by mouth daily.    Marland Kitchen allopurinol (ZYLOPRIM) 100 MG tablet Take 1 tablet (100 mg total) daily by mouth. 90 tablet 0  . atorvastatin (LIPITOR) 10 MG tablet TAKE ONE TABLET BY MOUTH DAILY 30 tablet 11  . benzonatate (TESSALON) 200 MG capsule Take 1 capsule (200 mg total) by mouth every 6 (six) hours as needed for cough. 30 capsule 1  . carvedilol (COREG) 12.5 MG tablet TAKE ONE TABLET BY MOUTH TWICE DAILY. TAKE WITH A MEAL. 180 tablet 2   . cetirizine (ZYRTEC) 10 MG tablet Take 10 mg by mouth daily.    . digoxin (LANOXIN) 0.125 MG tablet Take 0.5 tablets (0.0625 mg total) by mouth daily. 45 tablet 3  . fluticasone (FLONASE) 50 MCG/ACT nasal spray Place 2 sprays into both nostrils at bedtime as needed for allergies. 16 g 3  . furosemide (LASIX) 40 MG tablet Take 1 tablet (40 mg total) by mouth 2 (two) times daily. 180 tablet 3  . potassium chloride SA (KLOR-CON) 20 MEQ tablet Take 1.5 tablets (30 mEq total) by mouth daily. 135 tablet 3  . TRELEGY ELLIPTA 100-62.5-25 MCG/INH AEPB INHALE ONE PUFF INTO THE LUNGS DAILY 60 each 2  . triamcinolone cream (KENALOG) 0.1 % Apply topically 2 (two) times daily. to affected area    . warfarin (COUMADIN) 1 MG tablet TAKE TWO (2) TABLETS BY MOUTH EVERY DAY OR USE AS DIRECTED 65 tablet 6   No facility-administered medications prior to visit.     Objective:     There were no vitals taken for this visit.         Assessment   No problem-specific Assessment & Plan notes found for this encounter.     Christinia Gully, MD 12/21/2019

## 2019-12-26 NOTE — Progress Notes (Signed)
Cardiology Office Note   Date:  12/27/2019   ID:  Troy [name], DOB 1940/03/13, MRN [id number]  PCP:  Patient, No Pcp Per    No chief complaint on file.  CAD  Wt Readings from Last 3 Encounters:  12/27/19 183 lb 9.6 oz (83.3 kg)  11/05/19 178 lb (80.7 kg)  06/25/19 178 lb 12.8 oz (81.1 kg)       History of Present Illness: Troy [name] is a 79 y.o. male  with chronic systolic heart failure, BiV ICD.  He has a mixed CM, chronic class 2 CHF, s/p biv-ICD-PLACED 2012, chb, and PAF. CAD, PCI of RCA in 1996 .   Echo 11/22/13 with EF 20-25%.   Hospitalized in12/16 for acute on chronic systolic HF. Was neg 5 L with diuresis- discharged with oxygen and to SNF for rehab.   Heart failure managed medically. Digoxin level of 1.8 was associated with nausea.   Hospitalized in 8/17 for presumed Lower GI bleed form diverticulosis. No colonoscopy was done.  ECHO in 2018 for branch retinal occlusion showed EF 35-40%.He is using [place] oxygen at night.  He was admitted in Jan 2019 with acute on chronic hypoxic resp failure. Hospital records show: "2D echo with a EF of 25-30%, global hypokinesis with akinesis of the inferior lateral wall. Overall severely reduced LV function. Grade 1 diastolic dysfunction. Due to decreased ejection fraction and wall motion abnormalities cardiology was consulted. Patientunderwent Lexiscan Myoview on 02/03/2017 which showed no inducible ischemia, evidence of scar in the inferior distribution. Cardiology recommended patient be transitioned to home regimen of oral diuretics with salt and fluid restriction."He has not typically followed up on daily weights.    The patientdoes nothave symptoms concerning for COVID-19 infection (fever, chills, cough, or new shortness of breath).   Since the last visit, he has done well.  He got his COVID vaccines.    Seen by AFib clinic in 10/21 after recent AFib episode with plan of: "The  patient's CHA2DS2-VASc score is 6, indicating a 9.7% annual risk of stroke.   Suspect 2/2 recent infection, abx, and steroids.  He is in SR today. Will continue present therapy for now. Could consider dofetilide if his afib becomes more persistent.  Continue Coreg 12.5 mg BID Continue digoxin 0.0625 mg daily Continue warfarin "  Echo in 10/21: "The left ventricle is moderately dilated in size with mildly increased wall thickness. 2. The left ventricular systolic function is moderately decreased, LVEF is visually estimated at 30-35%. 3. There is grade I diastolic dysfunction (impaired relaxation). 4. The aortic valve is trileaflet with mildly thickened leaflets with normal excursion. 5. There is mild aortic regurgitation. 6. The left atrium is moderately dilated in size. 7. The right ventricle is normal in size, with normal systolic function. "  Since getting out of the hospital, he has used some extra Lasix to help with ankle edema.  Still has some chronic SHOB.  Walking some, using [place] oxygen more than he has in the past.  Has had oxygen since at least 2016.    Past Medical History:  Diagnosis Date  . Atrial fibrillation ([place])   . CAD (coronary artery disease) 1996   status post PCI of the RCA   . Community acquired pneumonia 08/24/2015  . Congestive heart failure, unspecified   . COPD (chronic obstructive pulmonary disease) (HCC)    Pt on home O2 at night and PRN, unsure of date of diagnosis.  . Diabetes mellitus, type 2 ([place])   .  DJD (degenerative joint disease)   . GERD (gastroesophageal reflux disease)   . Gout   . HTN (hypertension)   . 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 Nephrolithiasis   . Other primary cardiomyopathies   . Personal history of DVT (deep vein thrombosis)   . Prostatitis   . Shingles     Past Surgical History:  Procedure Laterality Date  . BACK SURGERY    . BIV ICD GENERATOR CHANGEOUT N/A 12/14/2018   Procedure: BIV ICD GENERATOR  CHANGEOUT;  Surgeon: Evans Lance, MD;  Location: [facility];  Service: Cardiovascular;  Laterality: N/A;  . CARDIAC DEFIBRILLATOR PLACEMENT    . CORONARY STENT PLACEMENT    . PACEMAKER INSERTION       Current Outpatient Medications  Medication Sig Dispense Refill  . acetaminophen (TYLENOL) 500 MG tablet Take 1,000 mg by mouth every 6 (six) hours as needed for moderate pain.     Marland Kitchen albuterol (ACCUNEB) 1.25 MG/3ML nebulizer solution INHALE CONTENTS OF 1 VIAL IN NEBULIZER EVERY 6 HOURS AS NEEDED. 360 mL 5  . albuterol (VENTOLIN HFA) 108 (90 Base) MCG/ACT inhaler INHALE 1-2 PUFFS INTO THE LUNGS EVERY 6 HOURS AS NEEDED FOR WHEEZING OR SHORTNESS OF BREATH 8.5 g 5  . alfuzosin (UROXATRAL) 10 MG 24 hr tablet Take 10 mg by mouth daily.    Marland Kitchen allopurinol (ZYLOPRIM) 100 MG tablet Take 1 tablet (100 mg total) daily by mouth. 90 tablet 0  . atorvastatin (LIPITOR) 10 MG tablet TAKE ONE TABLET BY MOUTH DAILY 30 tablet 11  . benzonatate (TESSALON) 200 MG capsule Take 1 capsule (200 mg total) by mouth every 6 (six) hours as needed for cough. 30 capsule 1  . carvedilol (COREG) 12.5 MG tablet TAKE ONE TABLET BY MOUTH TWICE DAILY. TAKE WITH A MEAL. 180 tablet 2  . cetirizine (ZYRTEC) 10 MG tablet Take 10 mg by mouth daily.    . digoxin (LANOXIN) 0.125 MG tablet Take 0.5 tablets (0.0625 mg total) by mouth daily. 45 tablet 3  . fluticasone (FLONASE) 50 MCG/ACT nasal spray Place 2 sprays into both nostrils at bedtime as needed for allergies. 16 g 3  . furosemide (LASIX) 40 MG tablet Take 1 tablet (40 mg total) by mouth 2 (two) times daily. 180 tablet 3  . potassium chloride SA (KLOR-CON) 20 MEQ tablet Take 1.5 tablets (30 mEq total) by mouth daily. 135 tablet 3  . TRELEGY ELLIPTA 100-62.5-25 MCG/INH AEPB INHALE ONE PUFF INTO THE LUNGS DAILY 60 each 2  . triamcinolone cream (KENALOG) 0.1 % Apply topically 2 (two) times daily. to affected area    . warfarin (COUMADIN) 1 MG tablet TAKE TWO (2) TABLETS BY  MOUTH EVERY DAY OR USE AS DIRECTED 65 tablet 6   No current facility-administered medications for this visit.    Allergies:   Benadryl [diphenhydramine hcl]    Social History:  The patient  reports that he quit smoking about 39 years ago. His smoking use included cigarettes. He has a 200.00 pack-year smoking history. He has never used smokeless tobacco. He reports that he does not drink alcohol and does not use drugs.   Family History:  The patient's family history includes Cancer in his father; Colon cancer in his mother; Heart disease in his father; Stroke in his paternal uncle.    ROS:  Please see the history of present illness.   Otherwise, review of systems are positive for [facility].   All other systems are reviewed and negative.  PHYSICAL EXAM: VS:  BP (!) 146/60   Pulse 75   Ht 5\' 6"  (1.676 m)   Wt 183 lb 9.6 oz (83.3 kg)   SpO2 95%   BMI 29.63 kg/m  , BMI Body mass index is 29.63 kg/m. GEN: Well nourished, well developed, in no acute distress  HEENT: normal  Neck: no JVD, carotid bruits, or masses Cardiac: RRR; no murmurs, rubs, or gallops,no edema  Respiratory:  clear to auscultation bilaterally, normal work of breathing GI: soft, nontender, nondistended, + BS MS: no deformity or atrophy  Skin: warm and dry, no rash Neuro:  Strength and sensation are intact Psych: euthymic mood, full affect   EKG:   The ekg ordered today demonstrates AV paced rhythm   Recent Labs: 06/25/2019: BUN 28; Creatinine, Ser 1.68; Potassium 4.4; Sodium 141   Lipid Panel    Component Value Date/Time   CHOL 135 09/19/2016 1454   TRIG 168 (H) 09/19/2016 1454   HDL 42 09/19/2016 1454   CHOLHDL 3.2 09/19/2016 1454   VLDL 34 (H) 09/19/2016 1454   LDLCALC 59 09/19/2016 1454     Other studies Reviewed: Additional studies/ records that were reviewed today with results demonstrating: [facility] results reviewed.   ASSESSMENT AND PLAN:  1. Chronic systolic heart failure: Appears euvolemic.  OK to take extra 20 mg of Lasix daily with the 40 mg BID.  Rx adjusted.  He is good about adjusting his Lasix dose.  2. PAF: Anticoagulated.  Warfarin for stroke prevention. In NSR today.  3. CAD: No angina. Continue aggresive secondary prevention.  4. CRI: Cr 1.4 in 10/21 @ [facility].  Avoid nephrotoxins.  5. BiV pacer: following with EP.  6. PreDM: 6.2 A1C in 2020.  Avoid processed foods.   7. COntinue with home oxygen.       Current medicines are reviewed at length with the patient today.  The patient concerns regarding his medicines were addressed.  The following changes have been made:  No change  Labs/ tests ordered today include:  No orders of the defined types were placed in this encounter.   Recommend 150 minutes/week of aerobic exercise Low fat, low carb, high fiber diet recommended  Disposition:   FU in 6 months   Signed, Larae Grooms, MD  12/27/2019 1:37 PM    [facility] [address], [place], [place]  [address] Phone: [phone]; Fax: [phone]

## 2019-12-27 ENCOUNTER — Encounter: Payer: Self-pay | Admitting: Interventional Cardiology

## 2019-12-27 ENCOUNTER — Other Ambulatory Visit: Payer: Self-pay

## 2019-12-27 ENCOUNTER — Ambulatory Visit (INDEPENDENT_AMBULATORY_CARE_PROVIDER_SITE_OTHER): Payer: Medicare Other | Admitting: Interventional Cardiology

## 2019-12-27 VITALS — BP 146/60 | HR 75 | Ht 66.0 in | Wt 183.6 lb

## 2019-12-27 DIAGNOSIS — D6869 Other thrombophilia: Secondary | ICD-10-CM | POA: Diagnosis not present

## 2019-12-27 DIAGNOSIS — I482 Chronic atrial fibrillation, unspecified: Secondary | ICD-10-CM

## 2019-12-27 DIAGNOSIS — I779 Disorder of arteries and arterioles, unspecified: Secondary | ICD-10-CM | POA: Diagnosis not present

## 2019-12-27 DIAGNOSIS — I5022 Chronic systolic (congestive) heart failure: Secondary | ICD-10-CM | POA: Diagnosis not present

## 2019-12-27 DIAGNOSIS — Z95810 Presence of automatic (implantable) cardiac defibrillator: Secondary | ICD-10-CM | POA: Diagnosis not present

## 2019-12-27 MED ORDER — FUROSEMIDE 40 MG PO TABS: 60.0000 mg | ORAL_TABLET | Freq: Two times a day (BID) | ORAL | 3 refills | Status: DC

## 2019-12-27 NOTE — Patient Instructions (Signed)
Medication Instructions:  1) Continue to take an extra half dose of Furosemide as needed.    *If you need a refill on your cardiac medications before your next appointment, please call your pharmacy*   Lab Work: None If you have labs (blood work) drawn today and your tests are completely normal, you will receive your results only by: Marland Kitchen MyChart Message (if you have MyChart) OR . A paper copy in the mail If you have any lab test that is abnormal or we need to change your treatment, we will call you to review the results.   Testing/Procedures: None   Follow-Up: At [facility], you and your health needs are our priority.  As part of our continuing mission to provide you with exceptional heart care, we have created designated Provider Care Teams.  These Care Teams include your primary Cardiologist (physician) and Advanced Practice Providers (APPs -  Physician Assistants and Nurse Practitioners) who all work together to provide you with the care you need, when you need it.  We recommend signing up for the patient portal called "MyChart".  Sign up information is provided on this After Visit Summary.  MyChart is used to connect with patients for Virtual Visits (Telemedicine).  Patients are able to view lab/test results, encounter notes, upcoming appointments, etc.  Non-urgent messages can be sent to your provider as well.   To learn more about what you can do with MyChart, go to [email or web address].    Your next appointment:   6 month(s)  The format for your next appointment:   In Person  Provider:   You may see Larae Grooms, MD or one of the following Advanced Practice Providers on your designated Care Team:    Melina Copa, PA-C  Ermalinda Barrios, PA-C    Other Instructions

## 2020-01-04 ENCOUNTER — Other Ambulatory Visit: Payer: Self-pay | Admitting: Emergency Medicine

## 2020-01-04 ENCOUNTER — Ambulatory Visit (INDEPENDENT_AMBULATORY_CARE_PROVIDER_SITE_OTHER): Payer: Medicare Other | Admitting: *Deleted

## 2020-01-04 DIAGNOSIS — I482 Chronic atrial fibrillation, unspecified: Secondary | ICD-10-CM

## 2020-01-04 DIAGNOSIS — Z5181 Encounter for therapeutic drug level monitoring: Secondary | ICD-10-CM

## 2020-01-04 DIAGNOSIS — I480 Paroxysmal atrial fibrillation: Secondary | ICD-10-CM | POA: Diagnosis not present

## 2020-01-04 LAB — POCT INR: INR: 1.8 — AB (ref 2.0–3.0)

## 2020-01-04 NOTE — Patient Instructions (Signed)
Take warfarin 3 tablets tonight then resume 2 tablets daily except 1 tablet on Wednesday.  Keep your greens consistent.  Recheck INR in 4 weeks

## 2020-01-11 ENCOUNTER — Other Ambulatory Visit: Payer: Self-pay | Admitting: Emergency Medicine

## 2020-01-21 DIAGNOSIS — J441 Chronic obstructive pulmonary disease with (acute) exacerbation: Secondary | ICD-10-CM | POA: Diagnosis not present

## 2020-01-21 LAB — CUP PACEART REMOTE DEVICE CHECK
Battery Remaining Longevity: 74 mo
Battery Remaining Percentage: 84 %
Battery Voltage: 2.96 V
Brady Statistic AP VP Percent: 78 %
Brady Statistic AP VS Percent: 1 %
Brady Statistic AS VP Percent: 17 %
Brady Statistic AS VS Percent: 1 %
Brady Statistic RA Percent Paced: 65 %
Date Time Interrogation Session: 20211231181850
HighPow Impedance: 39 Ohm
Implantable Lead Implant Date: 20120801
Implantable Lead Implant Date: 20120801
Implantable Lead Implant Date: 20120801
Implantable Lead Location: 753858
Implantable Lead Location: 753859
Implantable Lead Location: 753860
Implantable Pulse Generator Implant Date: 20201123
Lead Channel Impedance Value: 330 Ohm
Lead Channel Impedance Value: 480 Ohm
Lead Channel Impedance Value: 750 Ohm
Lead Channel Pacing Threshold Amplitude: 0.75 V
Lead Channel Pacing Threshold Amplitude: 0.75 V
Lead Channel Pacing Threshold Amplitude: 1.125 V
Lead Channel Pacing Threshold Pulse Width: 0.5 ms
Lead Channel Pacing Threshold Pulse Width: 0.5 ms
Lead Channel Pacing Threshold Pulse Width: 0.5 ms
Lead Channel Sensing Intrinsic Amplitude: 3.4 mV
Lead Channel Sensing Intrinsic Amplitude: 9.3 mV
Lead Channel Setting Pacing Amplitude: 1.625
Lead Channel Setting Pacing Amplitude: 1.75 V
Lead Channel Setting Pacing Amplitude: 2 V
Lead Channel Setting Pacing Pulse Width: 0.5 ms
Lead Channel Setting Pacing Pulse Width: 0.5 ms
Lead Channel Setting Sensing Sensitivity: 0.5 mV
Pulse Gen Serial Number: 111012928

## 2020-01-24 ENCOUNTER — Ambulatory Visit (INDEPENDENT_AMBULATORY_CARE_PROVIDER_SITE_OTHER): Payer: Medicare Other

## 2020-01-24 DIAGNOSIS — I5023 Acute on chronic systolic (congestive) heart failure: Secondary | ICD-10-CM | POA: Diagnosis not present

## 2020-01-27 ENCOUNTER — Ambulatory Visit (INDEPENDENT_AMBULATORY_CARE_PROVIDER_SITE_OTHER): Payer: Medicare Other | Admitting: Emergency Medicine

## 2020-01-27 ENCOUNTER — Other Ambulatory Visit: Payer: Self-pay

## 2020-01-27 ENCOUNTER — Encounter: Payer: Self-pay | Admitting: Emergency Medicine

## 2020-01-27 DIAGNOSIS — J9611 Chronic respiratory failure with hypoxia: Secondary | ICD-10-CM

## 2020-01-27 DIAGNOSIS — J301 Allergic rhinitis due to pollen: Secondary | ICD-10-CM

## 2020-01-27 DIAGNOSIS — J449 Chronic obstructive pulmonary disease, unspecified: Secondary | ICD-10-CM | POA: Diagnosis not present

## 2020-01-27 NOTE — Assessment & Plan Note (Signed)
Using 2 L/min with exertion and while sleeping.  He wants to try to get a portable oxygen concentrator.  We will need to see if he can tolerate pulsed flow and then order this for him.

## 2020-01-27 NOTE — Assessment & Plan Note (Signed)
Continue fluticasone nasal spray 2 sprays each nostril once daily. Continue Zyrtec once daily.

## 2020-01-27 NOTE — Patient Instructions (Signed)
We will try to qualify you for pulsed oxygen concentrator You need to continue your oxygen with exertion and while sleeping.  Currently 2 L/min continuous on your existing system Continue Trelegy 1 elation once daily.  Rinse and gargle after using. Keep your albuterol available to use either 2 puffs or 1 nebulizer treatment up to every 4 hours if needed for shortness of breath, chest tightness, wheezing. Continue fluticasone nasal spray 2 sprays each nostril once daily. Continue Zyrtec once daily. COVID-19 vaccine is up-to-date Follow with Dr Lamonte Sakai in 4 months or sooner if you have any problems.

## 2020-01-27 NOTE — Progress Notes (Signed)
Subjective:    Patient ID: Troy [name] Troy [name] [name], male    DOB: 05/06/1940, 80 y.o.   MRN: [id number]  HPI 80 yo former smoker (100 pk-yrs), hx CAD and dilated CM, A Fib, DM, DVT's, AICD device. Dx w COPD several years ago. Referred by Dr Deforest Hoyles for dyspnea, cough and congestion.    ROV 12/22/17 --this is a follow-up visit for Troy [name] Troy [name] [name] who has history as outlined above.  We follow him for severe COPD, allergic rhinitis with associated cough.  He has documented exertional hypoxemia, does not reliably use his oxygen with exertion.  Since last time he reports that he has been doing well. He is able to get around his house, do his ADL, his shopping. Denies any cough or wheeze with any frequency.  He is on trelegy, uses albuterol nebs about 2x a day. He is on zyrtec, flonase. Flu shot is up to date. PNA vaccine up to date. No flares since last time. He was hospitalized at [facility] for diverticulitis  ROV 04/12/19 --80 year old Troy [name] [name] with a history of heavy tobacco use and severe COPD, allergic rhinitis, chronic cough.  He also has coronary artery disease and a dilated cardiomyopathy with AICD in place, atrial fibrillation, diabetes.  He has exertional hypoxemia - uses his O2 prn and at night. Uses it when SOB.  I last spoke with him 10/15/2018.  He reports today no flares. He does have exertional SOB, probably stable compared with last year. He has some occasional cough, minimal mucous, happens when he drinks something cold. He sometimes has dysphagia of liquids, not solids. He has a lot nasal gtt.  He is managed on Trelegy, uses albuterol most days, about once a day.  He is on Flonase, Zyrtec He has had first covid vaccine, due for his second next week  ROV 01/27/20 --follow-up visit for 80 year old man with severe COPD, allergic rhinitis and chronic cough.  Also with CAD and a dilated cardiomyopathy with AICD, atrial fibrillation, diabetes.  He has associated chronic hypoxemic respiratory failure  and uses He has had hospital admissions for CHF exacerbation September, COPD exacerbation since I last saw him in March. Was treated w abx and steroids.  Reports minimal cough or sputum. He does hear wheezing.  Managed on Trelegy.  Uses Flonase, Zyrtec for allergic rhinitis.  Albuterol nebs about 2-3x a day, principally for wheeze. He is on 2L/min w activity and with sleep.  He is still interested in obtaining a POC, has not yet been done  MDM reviewed ER notes from 10/14/2019, cardiology note from 11/10/2019   Review of Systems  Constitutional: Negative for fever and unexpected weight change.  HENT: Negative for congestion, dental problem, ear pain, nosebleeds, postnasal drip, rhinorrhea, sinus pressure, sneezing, sore throat and trouble swallowing.   Eyes: Negative for redness and itching.  Respiratory: Positive for cough and shortness of breath. Negative for chest tightness and wheezing.        Congestion  Cardiovascular: Negative for palpitations and leg swelling.  Gastrointestinal: Negative for nausea and vomiting.  Genitourinary: Negative for dysuria.  Musculoskeletal: Negative for joint swelling.  Skin: Negative for rash.  Neurological: Negative for headaches.  Hematological: Does not bruise/bleed easily.  Psychiatric/Behavioral: Negative for dysphoric mood. The patient is not nervous/anxious.       Objective:   Physical Exam Vitals:   01/27/20 1352  BP: 120/72  Pulse: (!) 56  Temp: 98.2 F (36.8 C)  TempSrc: Temporal  SpO2: 96%  Weight: 180 lb (81.6  kg)  Height: 5\' 6"  (1.676 m)  Gen: Pleasant, well-nourished, in no distress,  normal affect  ENT: No lesions,  mouth clear,  oropharynx clear, disconjugate gaze, no postnasal drip  Neck: No JVD, no stridor  Lungs: normal excursion, somewhat hyperinflated, no wheeze or crackles. He does have exp wheeze on a forced exp  Cardiovascular: RRR, heart sounds normal, no murmur or gallops, no peripheral edema  Musculoskeletal:  No deformities, no cyanosis or clubbing  Neuro: alert, non focal  Skin: Warm, no lesions or rashes     Assessment & Plan:  No problem-specific Assessment & Plan notes found for this encounter.  Baltazar Apo, MD, PhD 01/27/2020, 1:57 PM [facility] [phone] or if no answer [phone]

## 2020-01-27 NOTE — Addendum Note (Signed)
Addended by: Valerie Salts on: 01/27/2020 03:06 PM   Modules accepted: Orders

## 2020-01-27 NOTE — Assessment & Plan Note (Signed)
Continue Trelegy 1 elation once daily.  Rinse and gargle after using. Keep your albuterol available to use either 2 puffs or 1 nebulizer treatment up to every 4 hours if needed for shortness of breath, chest tightness, wheezing. COVID-19 vaccine is up-to-date Follow with Dr Lamonte Sakai in 4 months or sooner if you have any problems.

## 2020-02-01 ENCOUNTER — Ambulatory Visit (INDEPENDENT_AMBULATORY_CARE_PROVIDER_SITE_OTHER): Payer: Medicare Other | Admitting: *Deleted

## 2020-02-01 ENCOUNTER — Other Ambulatory Visit: Payer: Self-pay

## 2020-02-01 DIAGNOSIS — I482 Chronic atrial fibrillation, unspecified: Secondary | ICD-10-CM | POA: Diagnosis not present

## 2020-02-01 DIAGNOSIS — Z5181 Encounter for therapeutic drug level monitoring: Secondary | ICD-10-CM | POA: Diagnosis not present

## 2020-02-01 LAB — POCT INR: INR: 2.7 (ref 2.0–3.0)

## 2020-02-01 NOTE — Patient Instructions (Signed)
Continue warfarin 2 tablets daily except 1 tablet on Wednesday.  Keep your greens consistent.  Recheck INR in 4 weeks

## 2020-02-04 ENCOUNTER — Telehealth: Payer: Self-pay | Admitting: Pulmonary Disease

## 2020-02-04 ENCOUNTER — Telehealth: Payer: Self-pay | Admitting: Emergency Medicine

## 2020-02-04 NOTE — Telephone Encounter (Signed)
disregard

## 2020-02-04 NOTE — Telephone Encounter (Signed)
Spoke with pt, states that [employer] will not provide him a portable oxygen concentrator unless he travels to [employer] to do a qualifying walk in their office.  Pt lives in [place] and states he cannot travel that far.  Pt wants to know if POC O2 order can be switched to somewhere closer to him.  Pt specifically mentioned [place] or [employer].  PCCs please advise, thanks!

## 2020-02-08 NOTE — Telephone Encounter (Signed)
Order faxed to [place] [facility] pt aware order was sent to them

## 2020-02-08 NOTE — Progress Notes (Signed)
Remote ICD transmission.   

## 2020-02-15 ENCOUNTER — Telehealth: Payer: Self-pay | Admitting: Interventional Cardiology

## 2020-02-15 NOTE — Telephone Encounter (Signed)
I agree with the extra half tab of Lasix.

## 2020-02-15 NOTE — Telephone Encounter (Signed)
Call transferred from scheduler. The patient reports that he has been having increased swelling in his legs for the past two days. He states that he has not gained any weight and he has not been eating salty foods. He states that he does have SOB that may be a little bit worse than his baseline. He states that he tries to elevate his legs as much as he can. Advised to continue with elevation and salt avoidance. He states that his morning he took and extra half tablet of Lasix (60 mg total). I advised him that he should keep taking the extra half tablet and to let us know in a couple days if swelling has not resolved. Patient verbalized understanding.

## 2020-02-15 NOTE — Telephone Encounter (Signed)
Pt c/o swelling: STAT is pt has developed SOB within 24 hours  1) How much weight have you gained and in what time span? Doesn't keep track.  2) If swelling, where is the swelling located? In feet.  3) Are you currently taking a fluid pill? Yes.  4) Are you currently SOB? Yes.  5) Do you have a log of your daily weights (if so, list)? No.  6) Have you gained 3 pounds in a day or 5 pounds in a week? No.  7) Have you traveled recently? No.   Patient states that his feet are swollen and he would like to speak to Dr.Varanasi or his nurse about this matter. Please advise.

## 2020-02-16 ENCOUNTER — Ambulatory Visit (HOSPITAL_COMMUNITY)
Admission: RE | Admit: 2020-02-16 | Payer: Medicare Other | Source: Ambulatory Visit | Attending: Interventional Cardiology | Admitting: Interventional Cardiology

## 2020-02-18 ENCOUNTER — Telehealth: Payer: Self-pay | Admitting: Interventional Cardiology

## 2020-02-18 NOTE — Telephone Encounter (Signed)
Patient states he is returning a call from yesterday. I did not see a note.

## 2020-02-18 NOTE — Telephone Encounter (Signed)
I spoke with patient who reports he missed a call from our office yesterday.  I told him I did not see where anyone had called.  Patient reports his swelling is improving (see phone note from earlier this week). He does not have any questions.  He will let us know if swelling worsens.

## 2020-02-21 DIAGNOSIS — J441 Chronic obstructive pulmonary disease with (acute) exacerbation: Secondary | ICD-10-CM | POA: Diagnosis not present

## 2020-02-23 DIAGNOSIS — J440 Chronic obstructive pulmonary disease with acute lower respiratory infection: Secondary | ICD-10-CM | POA: Diagnosis not present

## 2020-02-23 DIAGNOSIS — M109 Gout, unspecified: Secondary | ICD-10-CM | POA: Diagnosis not present

## 2020-02-23 DIAGNOSIS — I5020 Unspecified systolic (congestive) heart failure: Secondary | ICD-10-CM | POA: Diagnosis not present

## 2020-02-23 DIAGNOSIS — M545 Low back pain, unspecified: Secondary | ICD-10-CM | POA: Diagnosis not present

## 2020-02-29 ENCOUNTER — Ambulatory Visit (INDEPENDENT_AMBULATORY_CARE_PROVIDER_SITE_OTHER): Payer: Medicare Other | Admitting: *Deleted

## 2020-02-29 DIAGNOSIS — Z5181 Encounter for therapeutic drug level monitoring: Secondary | ICD-10-CM | POA: Diagnosis not present

## 2020-02-29 DIAGNOSIS — I482 Chronic atrial fibrillation, unspecified: Secondary | ICD-10-CM

## 2020-02-29 LAB — POCT INR: INR: 2.5 (ref 2.0–3.0)

## 2020-02-29 NOTE — Patient Instructions (Signed)
Continue warfarin 2 tablets daily except 1 tablet on Wednesday.  Keep your greens consistent.  Recheck INR in 4 weeks

## 2020-03-02 ENCOUNTER — Ambulatory Visit (HOSPITAL_COMMUNITY)
Admission: RE | Admit: 2020-03-02 | Payer: Medicare Other | Source: Ambulatory Visit | Attending: Interventional Cardiology | Admitting: Interventional Cardiology

## 2020-03-02 DIAGNOSIS — I130 Hypertensive heart and chronic kidney disease with heart failure and stage 1 through stage 4 chronic kidney disease, or unspecified chronic kidney disease: Secondary | ICD-10-CM | POA: Diagnosis not present

## 2020-03-02 DIAGNOSIS — Z5941 Food insecurity: Secondary | ICD-10-CM | POA: Diagnosis not present

## 2020-03-02 DIAGNOSIS — Z20822 Contact with and (suspected) exposure to covid-19: Secondary | ICD-10-CM | POA: Diagnosis not present

## 2020-03-02 DIAGNOSIS — Z87891 Personal history of nicotine dependence: Secondary | ICD-10-CM | POA: Diagnosis not present

## 2020-03-02 DIAGNOSIS — R0602 Shortness of breath: Secondary | ICD-10-CM | POA: Diagnosis not present

## 2020-03-02 DIAGNOSIS — N189 Chronic kidney disease, unspecified: Secondary | ICD-10-CM | POA: Diagnosis not present

## 2020-03-02 DIAGNOSIS — M109 Gout, unspecified: Secondary | ICD-10-CM | POA: Diagnosis not present

## 2020-03-02 DIAGNOSIS — J9811 Atelectasis: Secondary | ICD-10-CM | POA: Diagnosis not present

## 2020-03-02 DIAGNOSIS — Z9981 Dependence on supplemental oxygen: Secondary | ICD-10-CM | POA: Diagnosis not present

## 2020-03-02 DIAGNOSIS — Z7901 Long term (current) use of anticoagulants: Secondary | ICD-10-CM | POA: Diagnosis not present

## 2020-03-02 DIAGNOSIS — N183 Chronic kidney disease, stage 3 unspecified: Secondary | ICD-10-CM | POA: Diagnosis not present

## 2020-03-02 DIAGNOSIS — N179 Acute kidney failure, unspecified: Secondary | ICD-10-CM | POA: Diagnosis not present

## 2020-03-02 DIAGNOSIS — Z8673 Personal history of transient ischemic attack (TIA), and cerebral infarction without residual deficits: Secondary | ICD-10-CM | POA: Diagnosis not present

## 2020-03-02 DIAGNOSIS — J449 Chronic obstructive pulmonary disease, unspecified: Secondary | ICD-10-CM | POA: Diagnosis not present

## 2020-03-02 DIAGNOSIS — J441 Chronic obstructive pulmonary disease with (acute) exacerbation: Secondary | ICD-10-CM | POA: Diagnosis not present

## 2020-03-02 DIAGNOSIS — J90 Pleural effusion, not elsewhere classified: Secondary | ICD-10-CM | POA: Diagnosis not present

## 2020-03-02 DIAGNOSIS — I5023 Acute on chronic systolic (congestive) heart failure: Secondary | ICD-10-CM | POA: Diagnosis not present

## 2020-03-02 DIAGNOSIS — N289 Disorder of kidney and ureter, unspecified: Secondary | ICD-10-CM | POA: Diagnosis not present

## 2020-03-02 DIAGNOSIS — I5084 End stage heart failure: Secondary | ICD-10-CM | POA: Diagnosis not present

## 2020-03-02 DIAGNOSIS — R5381 Other malaise: Secondary | ICD-10-CM | POA: Diagnosis not present

## 2020-03-02 DIAGNOSIS — I509 Heart failure, unspecified: Secondary | ICD-10-CM | POA: Diagnosis not present

## 2020-03-02 DIAGNOSIS — Z95810 Presence of automatic (implantable) cardiac defibrillator: Secondary | ICD-10-CM | POA: Diagnosis not present

## 2020-03-02 DIAGNOSIS — Z86718 Personal history of other venous thrombosis and embolism: Secondary | ICD-10-CM | POA: Diagnosis not present

## 2020-03-07 ENCOUNTER — Ambulatory Visit (INDEPENDENT_AMBULATORY_CARE_PROVIDER_SITE_OTHER): Payer: Medicare Other | Admitting: Internal Medicine

## 2020-03-07 ENCOUNTER — Other Ambulatory Visit: Payer: Self-pay

## 2020-03-07 ENCOUNTER — Encounter: Payer: Self-pay | Admitting: Internal Medicine

## 2020-03-07 ENCOUNTER — Telehealth: Payer: Self-pay

## 2020-03-07 VITALS — BP 138/76 | HR 57 | Ht 66.0 in | Wt 186.7 lb

## 2020-03-07 DIAGNOSIS — I429 Cardiomyopathy, unspecified: Secondary | ICD-10-CM

## 2020-03-07 DIAGNOSIS — I5022 Chronic systolic (congestive) heart failure: Secondary | ICD-10-CM

## 2020-03-07 DIAGNOSIS — I428 Other cardiomyopathies: Secondary | ICD-10-CM | POA: Diagnosis not present

## 2020-03-07 LAB — CUP PACEART INCLINIC DEVICE CHECK
Battery Remaining Longevity: 70 mo
Brady Statistic RA Percent Paced: 61 %
Brady Statistic RV Percent Paced: 94 %
Date Time Interrogation Session: 20220215133234
HighPow Impedance: 41.579
Implantable Lead Implant Date: 20120801
Implantable Lead Implant Date: 20120801
Implantable Lead Implant Date: 20120801
Implantable Lead Location: 753858
Implantable Lead Location: 753859
Implantable Lead Location: 753860
Implantable Pulse Generator Implant Date: 20201123
Lead Channel Impedance Value: 350 Ohm
Lead Channel Impedance Value: 487.5 Ohm
Lead Channel Impedance Value: 750 Ohm
Lead Channel Pacing Threshold Amplitude: 0.75 V
Lead Channel Pacing Threshold Amplitude: 0.75 V
Lead Channel Pacing Threshold Amplitude: 1 V
Lead Channel Pacing Threshold Pulse Width: 0.5 ms
Lead Channel Pacing Threshold Pulse Width: 0.5 ms
Lead Channel Pacing Threshold Pulse Width: 0.5 ms
Lead Channel Sensing Intrinsic Amplitude: 12 mV
Lead Channel Sensing Intrinsic Amplitude: 2.2 mV
Lead Channel Setting Pacing Amplitude: 1.5 V
Lead Channel Setting Pacing Amplitude: 1.75 V
Lead Channel Setting Pacing Amplitude: 2 V
Lead Channel Setting Pacing Pulse Width: 0.5 ms
Lead Channel Setting Pacing Pulse Width: 0.5 ms
Lead Channel Setting Sensing Sensitivity: 0.5 mV
Pulse Gen Serial Number: 111012928

## 2020-03-07 MED ORDER — FUROSEMIDE 40 MG PO TABS: 40.0000 mg | ORAL_TABLET | Freq: Two times a day (BID) | ORAL | 11 refills | Status: DC

## 2020-03-07 NOTE — Progress Notes (Signed)
HPI Mr. Kossman returns today for followup. He is a pleasant 80 yo man with a h/o CHB, chronic systolic heart failure, s/p biv ICD insertion who also has a non-ischemic CM. In the interim, he has been in the hospital with volume overload. He denies chest pain or sob. He has had progressively more atrial fib. His dyspnea has worsened. He does not feel palpitations.  Allergies  Allergen Reactions  . Benadryl [Diphenhydramine Hcl] Nausea And Vomiting     Current Outpatient Medications  Medication Sig Dispense Refill  . acetaminophen (TYLENOL) 500 MG tablet Take 1,000 mg by mouth every 6 (six) hours as needed for moderate pain.     Marland Kitchen albuterol (ACCUNEB) 1.25 MG/3ML nebulizer solution INHALE CONTENTS OF 1 VIAL IN NEBULIZER EVERY 6 HOURS AS NEEDED. 360 mL 5  . albuterol (VENTOLIN HFA) 108 (90 Base) MCG/ACT inhaler INHALE 1-2 PUFFS INTO THE LUNGS EVERY 6 HOURS AS NEEDED FOR WHEEZING OR SHORTNESS OF BREATH 8.5 g 5  . alfuzosin (UROXATRAL) 10 MG 24 hr tablet Take 10 mg by mouth daily.    Marland Kitchen allopurinol (ZYLOPRIM) 100 MG tablet Take 1 tablet (100 mg total) daily by mouth. 90 tablet 0  . atorvastatin (LIPITOR) 10 MG tablet TAKE ONE TABLET BY MOUTH DAILY 30 tablet 11  . carvedilol (COREG) 12.5 MG tablet TAKE ONE TABLET BY MOUTH TWICE DAILY. TAKE WITH A MEAL. 180 tablet 2  . cetirizine (ZYRTEC) 10 MG tablet Take 10 mg by mouth daily.    . digoxin (LANOXIN) 0.125 MG tablet Take 0.5 tablets (0.0625 mg total) by mouth daily. 45 tablet 3  . fluticasone (FLONASE) 50 MCG/ACT nasal spray Place 2 sprays into both nostrils at bedtime as needed for allergies. 16 g 3  . furosemide (LASIX) 40 MG tablet Take 1 tablet (40 mg total) by mouth 2 (two) times daily. 60 tablet 11  . potassium chloride SA (KLOR-CON) 20 MEQ tablet Take 1.5 tablets (30 mEq total) by mouth daily. 135 tablet 3  . TRELEGY ELLIPTA 100-62.5-25 MCG/INH AEPB INHALE ONE PUFF INTO THE LUNGS DAILY 60 each 2  . triamcinolone cream (KENALOG) 0.1 %  Apply topically 2 (two) times daily. to affected area    . warfarin (COUMADIN) 1 MG tablet TAKE TWO (2) TABLETS BY MOUTH EVERY DAY OR USE AS DIRECTED 65 tablet 6   No current facility-administered medications for this visit.     Past Medical History:  Diagnosis Date  . Atrial fibrillation ([place])   . CAD (coronary artery disease) 1996   status post PCI of the RCA   . Community acquired pneumonia 08/24/2015  . Congestive heart failure, unspecified   . COPD (chronic obstructive pulmonary disease) (HCC)    Pt on home O2 at night and PRN, unsure of date of diagnosis.  . Diabetes mellitus, type 2 ([place])   . DJD (degenerative joint disease)   . GERD (gastroesophageal reflux disease)   . Gout   . HTN (hypertension)   . 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 Nephrolithiasis   . Other primary cardiomyopathies   . Personal history of DVT (deep vein thrombosis)   . Prostatitis   . Shingles     ROS:   All systems reviewed and negative except as noted in the HPI.   Past Surgical History:  Procedure Laterality Date  . BACK SURGERY    . BIV ICD GENERATOR CHANGEOUT N/A 12/14/2018   Procedure: BIV ICD GENERATOR CHANGEOUT;  Surgeon: Evans Lance, MD;  Location: [facility];  Service: Cardiovascular;  Laterality: N/A;  . CARDIAC DEFIBRILLATOR PLACEMENT    . CORONARY STENT PLACEMENT    . PACEMAKER INSERTION       Family History  Problem Relation Age of Onset  . Heart disease Father   . Cancer Father        LUNG  . Colon cancer Mother   . Stroke Paternal Uncle   . Heart attack Neg Hx   . Hyperlipidemia Neg Hx   . Hypertension Neg Hx      Social History   Socioeconomic History  . Marital status: Single    Spouse name: Not on file  . Number of children: 0  . Years of education: Not on file  . Highest education level: Not on file  Occupational History  . Occupation: Retired from Chief of Staff  . Smoking status: Former Smoker    Packs/day: 4.00     Years: 50.00    Pack years: 200.00    Types: Cigarettes    Quit date: 01/22/1980    Years since quitting: 40.1  . Smokeless tobacco: Never Used  Vaping Use  . Vaping Use: Never used  Substance and Sexual Activity  . Alcohol use: No    Alcohol/week: 0.0 standard drinks    Comment: denies  . Drug use: No  . Sexual activity: Never  Other Topics Concern  . Not on file  Social History Narrative   Lives alone.  Single.  No children.  Ambulates with a cane.  Has a deceased brother and is estranged from sisters.   Social Determinants of Health   Financial Resource Strain: Not on file  Food Insecurity: Not on file  Transportation Needs: Not on file  Physical Activity: Not on file  Stress: Not on file  Social Connections: Not on file  Intimate Partner Violence: Not on file     BP 138/76   Pulse (!) 57   Ht 5\' 6"  (1.676 m)   Wt 186 lb 11.2 oz (84.7 kg)   SpO2 98%   BMI 30.13 kg/m   Physical Exam:  Well appearing NAD HEENT: Unremarkable Neck:  No JVD, no thyromegally Lymphatics:  No adenopathy Back:  No CVA tenderness Lungs:  Clear with no wheezes HEART:  Regular rate rhythm, no murmurs, no rubs, no clicks Abd:  soft, positive bowel sounds, no organomegally, no rebound, no guarding Ext:  2 plus pulses, no edema, no cyanosis, no clubbing Skin:  No rashes no nodules Neuro:  CN II through XII intact, motor grossly intact  DEVICE  Normal device function.  See PaceArt for details.   Assess/Plan: 1. Chronic systolic heart failure - his symptoms are class 3. I have asked him to increase lasix from 60 to 80 mg bid. We will recheck his bmp. 2. Atrial fib - he has had more atrial fib which correlates with his worsening dyspnea. I suspect that the atrial fib is contributing to his worsening CHF. However, he is a poor candidate for AA drug therapy due to oxygen dependent COPD and stage 4 renal insuff making both amio and dofetilide poor choices. 3. ICD - his [employer] Biv ICD is  working normally.  4. Coags - he has not had any bleeding on warfarin.   Royston Sinner Odus Clasby,MD

## 2020-03-07 NOTE — Telephone Encounter (Signed)
Patient checked in Dr. Tanna Furry clinic. Does not have [place] phone that has app on it. Contacted [employer] to order new phone.

## 2020-03-07 NOTE — Patient Instructions (Signed)
Medication Instructions:  Your physician has recommended you make the following change in your medication:   Increase Lasix to 80 mg (2 Tablets) Two Times Daily   *If you need a refill on your cardiac medications before your next appointment, please call your pharmacy*   Lab Work: Your physician recommends that you return for lab work in: 1 Week ( 03/15/20)   If you have labs (blood work) drawn today and your tests are completely normal, you will receive your results only by: Marland Kitchen MyChart Message (if you have MyChart) OR . A paper copy in the mail If you have any lab test that is abnormal or we need to change your treatment, we will call you to review the results.   Testing/Procedures: NONE    Follow-Up: At [facility], you and your health needs are our priority.  As part of our continuing mission to provide you with exceptional heart care, we have created designated Provider Care Teams.  These Care Teams include your primary Cardiologist (physician) and Advanced Practice Providers (APPs -  Physician Assistants and Nurse Practitioners) who all work together to provide you with the care you need, when you need it.  We recommend signing up for the patient portal called "MyChart".  Sign up information is provided on this After Visit Summary.  MyChart is used to connect with patients for Virtual Visits (Telemedicine).  Patients are able to view lab/test results, encounter notes, upcoming appointments, etc.  Non-urgent messages can be sent to your provider as well.   To learn more about what you can do with MyChart, go to [email or web address].    Your next appointment:   1 year(s)  The format for your next appointment:   In Person  Provider:   Cristopher Peru, MD   Other Instructions Thank you for choosing [facility]!

## 2020-03-15 ENCOUNTER — Other Ambulatory Visit (HOSPITAL_COMMUNITY)
Admission: RE | Admit: 2020-03-15 | Discharge: 2020-03-15 | Disposition: A | Discharge status: Home / Self Care | Payer: Medicare Other | Source: Ambulatory Visit | Attending: Internal Medicine | Admitting: Internal Medicine

## 2020-03-15 ENCOUNTER — Other Ambulatory Visit: Payer: Self-pay

## 2020-03-15 DIAGNOSIS — I5022 Chronic systolic (congestive) heart failure: Secondary | ICD-10-CM | POA: Insufficient documentation

## 2020-03-15 LAB — BASIC METABOLIC PANEL
Anion gap: 9 (ref 5–15)
BUN: 19 mg/dL (ref 8–23)
CO2: 36 mmol/L — ABNORMAL HIGH (ref 22–32)
Calcium: 9.2 mg/dL (ref 8.9–10.3)
Chloride: 95 mmol/L — ABNORMAL LOW (ref 98–111)
Creatinine, Ser: 1.45 mg/dL — ABNORMAL HIGH (ref 0.61–1.24)
GFR, Estimated: 49 mL/min — ABNORMAL LOW (ref >60)
Glucose, Bld: 133 mg/dL — ABNORMAL HIGH (ref 70–99)
Potassium: 3.4 mmol/L — ABNORMAL LOW (ref 3.5–5.1)
Sodium: 140 mmol/L (ref 135–145)

## 2020-03-20 DIAGNOSIS — J441 Chronic obstructive pulmonary disease with (acute) exacerbation: Secondary | ICD-10-CM | POA: Diagnosis not present

## 2020-03-23 ENCOUNTER — Telehealth: Payer: Self-pay

## 2020-03-23 DIAGNOSIS — E876 Hypokalemia: Secondary | ICD-10-CM

## 2020-03-23 NOTE — Telephone Encounter (Signed)
I spoke with patient and discussed lab results.. I encouraged him to eat black beans,white beans, avocados, potatoes, sweet potatoes, and watermelon, as a source for increased potassium. I will mail him a reminder letter to get lab work in July.

## 2020-03-23 NOTE — Telephone Encounter (Signed)
-----   Message from Damian Leavell, RN sent at 03/23/2020  3:22 PM EST ----- Can you please assist?  RDS patient.

## 2020-03-28 ENCOUNTER — Other Ambulatory Visit: Payer: Self-pay

## 2020-03-28 ENCOUNTER — Ambulatory Visit (INDEPENDENT_AMBULATORY_CARE_PROVIDER_SITE_OTHER): Payer: Medicare Other

## 2020-03-28 ENCOUNTER — Ambulatory Visit (INDEPENDENT_AMBULATORY_CARE_PROVIDER_SITE_OTHER): Payer: Medicare Other | Admitting: *Deleted

## 2020-03-28 DIAGNOSIS — I779 Disorder of arteries and arterioles, unspecified: Secondary | ICD-10-CM

## 2020-03-28 DIAGNOSIS — I482 Chronic atrial fibrillation, unspecified: Secondary | ICD-10-CM | POA: Diagnosis not present

## 2020-03-28 DIAGNOSIS — I6523 Occlusion and stenosis of bilateral carotid arteries: Secondary | ICD-10-CM | POA: Diagnosis not present

## 2020-03-28 DIAGNOSIS — Z5181 Encounter for therapeutic drug level monitoring: Secondary | ICD-10-CM

## 2020-03-28 LAB — POCT INR: INR: 2.4 (ref 2.0–3.0)

## 2020-03-28 NOTE — Telephone Encounter (Signed)
Patient has received new monitor and has it charging (cell phone). Request we call back at another time to help paid device.

## 2020-03-28 NOTE — Patient Instructions (Signed)
Continue warfarin 2 tablets daily except 1 tablet on Wednesday.  Keep your greens consistent.  Recheck INR in 6 weeks

## 2020-03-28 NOTE — Telephone Encounter (Signed)
-----   Message from Bernita Raisin, RN sent at 03/23/2020  3:30 PM EST ----- Regarding: [place] Patient has not received anything from [facility], he is asking for follow up. Thanks   03/07/20 2:47 PM Simone Curia, RN routed this conversation to [place]    Simone Curia, [place]     03/07/20 2:47 PM Note Patient checked in Dr. Tanna Furry clinic. Does not have [place] phone that has app on it. Contacted [employer] to order new phone

## 2020-04-10 NOTE — Telephone Encounter (Signed)
Transmission received 03/30/20.

## 2020-04-19 ENCOUNTER — Ambulatory Visit: Payer: Medicare Other | Admitting: Interventional Cardiology

## 2020-04-20 DIAGNOSIS — J441 Chronic obstructive pulmonary disease with (acute) exacerbation: Secondary | ICD-10-CM | POA: Diagnosis not present

## 2020-04-21 ENCOUNTER — Ambulatory Visit (INDEPENDENT_AMBULATORY_CARE_PROVIDER_SITE_OTHER): Payer: Medicare Other

## 2020-04-21 DIAGNOSIS — I5022 Chronic systolic (congestive) heart failure: Secondary | ICD-10-CM | POA: Diagnosis not present

## 2020-04-21 LAB — CUP PACEART REMOTE DEVICE CHECK
Battery Remaining Longevity: 79 mo
Battery Remaining Percentage: 82 %
Battery Voltage: 2.96 V
Brady Statistic AP VP Percent: 57 %
Brady Statistic AP VS Percent: 1 %
Brady Statistic AS VP Percent: 36 %
Brady Statistic AS VS Percent: 1 %
Brady Statistic RA Percent Paced: 22 %
Date Time Interrogation Session: 20220401022524
HighPow Impedance: 41 Ohm
Implantable Lead Implant Date: 20120801
Implantable Lead Implant Date: 20120801
Implantable Lead Implant Date: 20120801
Implantable Lead Location: 753858
Implantable Lead Location: 753859
Implantable Lead Location: 753860
Implantable Pulse Generator Implant Date: 20201123
Lead Channel Impedance Value: 350 Ohm
Lead Channel Impedance Value: 480 Ohm
Lead Channel Impedance Value: 760 Ohm
Lead Channel Pacing Threshold Amplitude: 0.75 V
Lead Channel Pacing Threshold Amplitude: 0.75 V
Lead Channel Pacing Threshold Amplitude: 1 V
Lead Channel Pacing Threshold Pulse Width: 0.5 ms
Lead Channel Pacing Threshold Pulse Width: 0.5 ms
Lead Channel Pacing Threshold Pulse Width: 0.5 ms
Lead Channel Sensing Intrinsic Amplitude: 12 mV
Lead Channel Sensing Intrinsic Amplitude: 5 mV
Lead Channel Setting Pacing Amplitude: 1.5 V
Lead Channel Setting Pacing Amplitude: 1.75 V
Lead Channel Setting Pacing Amplitude: 2 V
Lead Channel Setting Pacing Pulse Width: 0.5 ms
Lead Channel Setting Pacing Pulse Width: 0.5 ms
Lead Channel Setting Sensing Sensitivity: 0.5 mV
Pulse Gen Serial Number: 111012928

## 2020-05-03 NOTE — Progress Notes (Signed)
Remote ICD transmission.   

## 2020-05-07 DIAGNOSIS — R0602 Shortness of breath: Secondary | ICD-10-CM | POA: Diagnosis not present

## 2020-05-07 DIAGNOSIS — Z8673 Personal history of transient ischemic attack (TIA), and cerebral infarction without residual deficits: Secondary | ICD-10-CM | POA: Diagnosis not present

## 2020-05-07 DIAGNOSIS — B9689 Other specified bacterial agents as the cause of diseases classified elsewhere: Secondary | ICD-10-CM | POA: Diagnosis not present

## 2020-05-07 DIAGNOSIS — Z79899 Other long term (current) drug therapy: Secondary | ICD-10-CM | POA: Diagnosis not present

## 2020-05-07 DIAGNOSIS — N189 Chronic kidney disease, unspecified: Secondary | ICD-10-CM | POA: Diagnosis not present

## 2020-05-07 DIAGNOSIS — J90 Pleural effusion, not elsewhere classified: Secondary | ICD-10-CM | POA: Diagnosis not present

## 2020-05-07 DIAGNOSIS — I517 Cardiomegaly: Secondary | ICD-10-CM | POA: Diagnosis not present

## 2020-05-07 DIAGNOSIS — Z9981 Dependence on supplemental oxygen: Secondary | ICD-10-CM | POA: Diagnosis not present

## 2020-05-07 DIAGNOSIS — R7989 Other specified abnormal findings of blood chemistry: Secondary | ICD-10-CM | POA: Diagnosis not present

## 2020-05-07 DIAGNOSIS — E785 Hyperlipidemia, unspecified: Secondary | ICD-10-CM | POA: Diagnosis not present

## 2020-05-07 DIAGNOSIS — I509 Heart failure, unspecified: Secondary | ICD-10-CM | POA: Diagnosis not present

## 2020-05-07 DIAGNOSIS — I5023 Acute on chronic systolic (congestive) heart failure: Secondary | ICD-10-CM | POA: Diagnosis not present

## 2020-05-07 DIAGNOSIS — I5043 Acute on chronic combined systolic (congestive) and diastolic (congestive) heart failure: Secondary | ICD-10-CM | POA: Diagnosis not present

## 2020-05-07 DIAGNOSIS — J441 Chronic obstructive pulmonary disease with (acute) exacerbation: Secondary | ICD-10-CM | POA: Diagnosis not present

## 2020-05-07 DIAGNOSIS — N183 Chronic kidney disease, stage 3 unspecified: Secondary | ICD-10-CM | POA: Diagnosis not present

## 2020-05-07 DIAGNOSIS — I482 Chronic atrial fibrillation, unspecified: Secondary | ICD-10-CM | POA: Diagnosis not present

## 2020-05-07 DIAGNOSIS — Z5941 Food insecurity: Secondary | ICD-10-CM | POA: Diagnosis not present

## 2020-05-07 DIAGNOSIS — R778 Other specified abnormalities of plasma proteins: Secondary | ICD-10-CM | POA: Diagnosis not present

## 2020-05-07 DIAGNOSIS — J9611 Chronic respiratory failure with hypoxia: Secondary | ICD-10-CM | POA: Diagnosis not present

## 2020-05-07 DIAGNOSIS — Z95810 Presence of automatic (implantable) cardiac defibrillator: Secondary | ICD-10-CM | POA: Diagnosis not present

## 2020-05-07 DIAGNOSIS — Z20822 Contact with and (suspected) exposure to covid-19: Secondary | ICD-10-CM | POA: Diagnosis not present

## 2020-05-07 DIAGNOSIS — I130 Hypertensive heart and chronic kidney disease with heart failure and stage 1 through stage 4 chronic kidney disease, or unspecified chronic kidney disease: Secondary | ICD-10-CM | POA: Diagnosis not present

## 2020-05-07 DIAGNOSIS — I480 Paroxysmal atrial fibrillation: Secondary | ICD-10-CM | POA: Diagnosis not present

## 2020-05-07 DIAGNOSIS — Z7901 Long term (current) use of anticoagulants: Secondary | ICD-10-CM | POA: Diagnosis not present

## 2020-05-07 DIAGNOSIS — J449 Chronic obstructive pulmonary disease, unspecified: Secondary | ICD-10-CM | POA: Diagnosis not present

## 2020-05-07 DIAGNOSIS — R6889 Other general symptoms and signs: Secondary | ICD-10-CM | POA: Diagnosis not present

## 2020-05-07 DIAGNOSIS — I4891 Unspecified atrial fibrillation: Secondary | ICD-10-CM | POA: Diagnosis not present

## 2020-05-07 DIAGNOSIS — M109 Gout, unspecified: Secondary | ICD-10-CM | POA: Diagnosis not present

## 2020-05-07 DIAGNOSIS — Z743 Need for continuous supervision: Secondary | ICD-10-CM | POA: Diagnosis not present

## 2020-05-07 DIAGNOSIS — R072 Precordial pain: Secondary | ICD-10-CM | POA: Diagnosis not present

## 2020-05-07 DIAGNOSIS — B957 Other staphylococcus as the cause of diseases classified elsewhere: Secondary | ICD-10-CM | POA: Diagnosis not present

## 2020-05-07 DIAGNOSIS — R069 Unspecified abnormalities of breathing: Secondary | ICD-10-CM | POA: Diagnosis not present

## 2020-05-07 DIAGNOSIS — Z87891 Personal history of nicotine dependence: Secondary | ICD-10-CM | POA: Diagnosis not present

## 2020-05-09 ENCOUNTER — Other Ambulatory Visit: Payer: Self-pay | Admitting: Interventional Cardiology

## 2020-05-11 ENCOUNTER — Other Ambulatory Visit: Payer: Self-pay

## 2020-05-11 MED ORDER — TRELEGY ELLIPTA 100-62.5-25 MCG/INH IN AEPB: INHALATION_SPRAY | RESPIRATORY_TRACT | 1 refills | Status: AC

## 2020-05-15 ENCOUNTER — Ambulatory Visit (INDEPENDENT_AMBULATORY_CARE_PROVIDER_SITE_OTHER): Payer: Medicare Other | Admitting: *Deleted

## 2020-05-15 DIAGNOSIS — I482 Chronic atrial fibrillation, unspecified: Secondary | ICD-10-CM

## 2020-05-15 DIAGNOSIS — Z5181 Encounter for therapeutic drug level monitoring: Secondary | ICD-10-CM

## 2020-05-15 DIAGNOSIS — I480 Paroxysmal atrial fibrillation: Secondary | ICD-10-CM | POA: Diagnosis not present

## 2020-05-15 LAB — POCT INR: INR: 2.1 (ref 2.0–3.0)

## 2020-05-15 NOTE — Patient Instructions (Signed)
Continue warfarin 2 tablets daily except 1 tablet on Wednesday.  Keep your greens consistent.  Recheck INR in 6 weeks

## 2020-05-16 ENCOUNTER — Telehealth: Payer: Self-pay | Admitting: Emergency Medicine

## 2020-05-16 DIAGNOSIS — J9611 Chronic respiratory failure with hypoxia: Secondary | ICD-10-CM

## 2020-05-16 NOTE — Telephone Encounter (Signed)
lmam at [place]

## 2020-05-16 NOTE — Telephone Encounter (Signed)
Spoke with Butch Penny  She states that never received POC from [employer] that we ordered in Jan 2022 She called them and said order had expired  I looked at the referral, and looks like the order was actually sent to [facility]  She states this would be better to get from them anyway  They never called the pt though even though referral says they received our fax  PCC's can you troubleshoot what happened please? Thanks!

## 2020-05-16 NOTE — Telephone Encounter (Signed)
Troy [name] is on a DPR from 2021 okay to release information to her

## 2020-05-16 NOTE — Telephone Encounter (Signed)
atc Butch Penny unable to reach. However she is not on the DPR so we need patient's permission-DPR states patient only   We need to know if patient is already on the O2 , is this a re-certification, or new start for oxygen? Depending on answer will determine course of action.   If new start or re-certification then needs walk , ov and order within 30 days of each other  If on O2 and needs a POC put in order for best fit in comments put patient needs POC and the walk details.

## 2020-05-17 ENCOUNTER — Telehealth: Payer: Self-pay | Admitting: Interventional Cardiology

## 2020-05-17 MED ORDER — FUROSEMIDE 40 MG PO TABS: 80.0000 mg | ORAL_TABLET | Freq: Two times a day (BID) | ORAL | 3 refills | Status: DC

## 2020-05-17 NOTE — Telephone Encounter (Signed)
I spoke with patient.  He reports he has already been taking Lasix 80 mg twice daily.  He is not sure how long he has been taking this dose but it looks like it was increased at office visit with Dr Lovena Le in February. Patient reports swelling and shortness of breath improved during recent admission but returned a few days after discharged.

## 2020-05-17 NOTE — Telephone Encounter (Signed)
Patient notified.  He will let us know if shortness of breath does not improve.  Echo report in [place].  Done at [facility] on 11/11/19.  Dr Irish Lack reviewed report and patient does not need repeat echo at this time.

## 2020-05-17 NOTE — Telephone Encounter (Signed)
I spoke with the pt's daughter and made her aware of response per Vallarie Mare. She states they will be okay with getting the POC from [employer]. I will place a new order since she had told me that our had expired.

## 2020-05-17 NOTE — Telephone Encounter (Signed)
Increase Lasix to 80 mg in AM and 40 mg in afternoon for 4 days, then can go back to 40 mg BID.  Will plan for BNP, BMet at his next visit.   JV

## 2020-05-17 NOTE — Telephone Encounter (Signed)
He can increase to 120 mg in AM and 80 in the afternoon for 4 days, then back to 80 mg BID.  If this does not help, would consider switch to Demdex for better absorption.  BMet at next visit.  Will need a repeat echo if he has not had one recently.   JV

## 2020-05-17 NOTE — Telephone Encounter (Signed)
Patient is c/o SOB that has been occurring since his ED visit on 05/07/20 discharged on 05/10/20 per patient.  He denies pain with deep breath.  He reports that he checks his weight and BP daily; however he is unable to give me readings.   He reports bilateral ankle swelling with left greater than right.  He denies warmth and pain to LLE.  He says he has elevated his legs but does not wear compression socks.  He has some but doesn't wear them; I advised him to start back wearing them to help with swelling.  He has a hx of COPD I inquired if medications for this were adjusted he reports no. I advised him that I would send note to Dr. Christ Kick and Fraser Din, RN.  If SOB increases prior to hearing from our office to go to the ED for further evaluation.

## 2020-05-17 NOTE — Telephone Encounter (Signed)
    Pt c/o Shortness Of Breath: STAT if SOB developed within the last 24 hours or pt is noticeably SOB on the phone  1. Are you currently SOB (can you hear that pt is SOB on the phone)? No  2. How long have you been experiencing SOB?   3. Are you SOB when sitting or when up moving around? Moving around  4. Are you currently experiencing any other symptoms? Pt said his SOB been bothering him a lot, he requested a call back from Dr. Hassell Done nurse, tried to ask mire info but pt said to have the nurse call him back instead

## 2020-05-17 NOTE — Telephone Encounter (Signed)
I received a call back from [place] she said Mariann Laster had received the order and had spoke to the patient that [employer] did his o2 and it would be 2100.00 self pay. Patient stated he wanted it to go through his insurance. They said [employer] would have to send it to billing to see if they would cover if not he would have to go through [employer] said if we have questions call Mariann Laster [phone]

## 2020-05-20 DIAGNOSIS — J441 Chronic obstructive pulmonary disease with (acute) exacerbation: Secondary | ICD-10-CM | POA: Diagnosis not present

## 2020-06-01 ENCOUNTER — Telehealth: Payer: Self-pay | Admitting: Interventional Cardiology

## 2020-06-01 NOTE — Telephone Encounter (Signed)
Reviewed with Dr Irish Lack and patient should continue Lasix 80 mg BID as he is not having any shortness of breath.  Let us know if he develops shortness of breath.  Keep legs elevated as much as possible.   I spoke with patient and gave him this information.  I asked him to call if he develops shortness of breath

## 2020-06-01 NOTE — Telephone Encounter (Signed)
I spoke with patient. He reports swelling in right ankle.  Ankle is a little sore also.  No shortness of breath. Does not weigh daily.  Lasix was increased for 4 days in 4/27 phone note.  Patient went back to taking 80 BID but increased lasix to 120 mg in the AM and 80 in the PM today due to swelling.  Patient is seeing Dr Irish Lack on Jun 12, 2020. I offered sooner appointment but he is unable to come in earlier due to transportation issues.  I asked patient to try and weigh every AM and keep record of readings.  I told patient I would make Dr Irish Lack aware and then call him back with his recommendations.

## 2020-06-01 NOTE — Telephone Encounter (Signed)
Pt c/o swelling: STAT is pt has developed SOB within 24 hours  1) How much weight have you gained and in what time span? Not sure  2) If swelling, where is the swelling located? Leg and one foot  3) Are you currently taking a fluid pill? Yes   4) Are you currently SOB? No not now  5) Do you have a log of your daily weights (if so, list)? No   6) Have you gained 3 pounds in a day or 5 pounds in a week? No   7) Have you traveled recently? No  .

## 2020-06-05 ENCOUNTER — Other Ambulatory Visit: Payer: Self-pay | Admitting: Interventional Cardiology

## 2020-06-05 ENCOUNTER — Ambulatory Visit: Payer: Medicare Other | Admitting: Interventional Cardiology

## 2020-06-09 ENCOUNTER — Telehealth: Payer: Self-pay

## 2020-06-09 NOTE — Telephone Encounter (Signed)
Patient's appointment changed to 5/24 at 1:30 PM. Arranged for [employer]. Will need to have patient sign waiver form and send to [email or web address].

## 2020-06-12 ENCOUNTER — Ambulatory Visit: Payer: Medicare Other | Admitting: Interventional Cardiology

## 2020-06-13 ENCOUNTER — Ambulatory Visit: Payer: Medicare Other | Admitting: Interventional Cardiology

## 2020-06-15 ENCOUNTER — Telehealth: Payer: Self-pay | Admitting: Emergency Medicine

## 2020-06-15 MED ORDER — BENZONATATE 100 MG PO CAPS: 100.0000 mg | ORAL_CAPSULE | Freq: Four times a day (QID) | ORAL | 1 refills | Status: AC | PRN

## 2020-06-15 NOTE — Telephone Encounter (Signed)
He can try using OTC tylenol cold and flu, or good cough suppressant would be delsym If he need something prescription then please call in tessalon perles 100mg  q6h prn cough  Also > low threshold to have him get covid tested. If he continues to have sx then he likely needs to be tested.

## 2020-06-15 NOTE — Telephone Encounter (Signed)
Called and spoke with patient. He stated that he has tried the OTC medications and they are not working. He is interested in trying the perles. Will go ahead and send in the perles to [facility].   Nothing further needed.

## 2020-06-15 NOTE — Telephone Encounter (Signed)
Spoke with the pt  He states 06/08/20 started having chills and nausea  5/20 started having increased cough with white sputum  He states that he does not have any more nausea, chills He denies f/c/s, aches, increased SOB, wheezing, chest tightness  He has not had any covid testing done since onset of symptoms  He has been vaccinated x 3  Asking for something to help with a cough  He is still taking his trelegy and albuterol inhaler and nebs  Please advise any recs, thanks!  Allergies  Allergen Reactions  . Benadryl [Diphenhydramine Hcl] Nausea And Vomiting

## 2020-06-16 ENCOUNTER — Telehealth: Payer: Self-pay | Admitting: Emergency Medicine

## 2020-06-16 DIAGNOSIS — Z20822 Contact with and (suspected) exposure to covid-19: Secondary | ICD-10-CM | POA: Diagnosis not present

## 2020-06-16 DIAGNOSIS — Z8709 Personal history of other diseases of the respiratory system: Secondary | ICD-10-CM | POA: Diagnosis not present

## 2020-06-16 DIAGNOSIS — U071 COVID-19: Secondary | ICD-10-CM | POA: Diagnosis not present

## 2020-06-16 MED ORDER — PAXLOVID 20 X 150 MG & 10 X 100MG PO TBPK: 3.0000 | ORAL_TABLET | Freq: Two times a day (BID) | ORAL | 0 refills | Status: AC

## 2020-06-16 NOTE — Telephone Encounter (Signed)
Please refer to 06/15/2020.  Patient wanted to Dr. Lamonte Sakai aware that he tested positive for covid today.  Cough has improved some with OTC meds.  Denied any new or worsen sx.   Dr. Lamonte Sakai, please advise. Thanks. He wanted to know if anything else should be prescribed?

## 2020-06-16 NOTE — Telephone Encounter (Signed)
Called and spoke with patient. He stated that he is ok with starting Paxlovid. He is aware of the instructions. He stated that he is not able to drive to the pharmacy and wishes to have [facility] to deliver the RX to his house. I advised him that I would call [facility] to see if they have the medication in stock and would call him back.   Called [facility] and spoke with [place]. She stated that they do not have it in stock but [facility] [address] [facility]   Called patient back to let him know. [facility] [place]. RX has been sent.   Nothing further needed at time of call.

## 2020-06-16 NOTE — Telephone Encounter (Signed)
Let him know that I believe he may benefit from taking an anti-viral medicine to minimize progression and worsening. If he is willing to do so, please order :  Paxlovid 150/100 mg daily x 5 days.  Also please make sure he knows that this medication can effect his warfarin levels. He should notify whomever checks his INR that we have given him this medication.

## 2020-06-20 DIAGNOSIS — Z87891 Personal history of nicotine dependence: Secondary | ICD-10-CM | POA: Diagnosis not present

## 2020-06-20 DIAGNOSIS — J441 Chronic obstructive pulmonary disease with (acute) exacerbation: Secondary | ICD-10-CM | POA: Diagnosis not present

## 2020-06-20 DIAGNOSIS — M109 Gout, unspecified: Secondary | ICD-10-CM | POA: Diagnosis not present

## 2020-06-20 DIAGNOSIS — I480 Paroxysmal atrial fibrillation: Secondary | ICD-10-CM | POA: Diagnosis not present

## 2020-06-20 DIAGNOSIS — J449 Chronic obstructive pulmonary disease, unspecified: Secondary | ICD-10-CM | POA: Diagnosis not present

## 2020-06-20 DIAGNOSIS — J90 Pleural effusion, not elsewhere classified: Secondary | ICD-10-CM | POA: Diagnosis not present

## 2020-06-20 DIAGNOSIS — N1832 Chronic kidney disease, stage 3b: Secondary | ICD-10-CM | POA: Diagnosis not present

## 2020-06-20 DIAGNOSIS — I10 Essential (primary) hypertension: Secondary | ICD-10-CM | POA: Diagnosis not present

## 2020-06-20 DIAGNOSIS — R059 Cough, unspecified: Secondary | ICD-10-CM | POA: Diagnosis not present

## 2020-06-20 DIAGNOSIS — I5023 Acute on chronic systolic (congestive) heart failure: Secondary | ICD-10-CM | POA: Diagnosis not present

## 2020-06-20 DIAGNOSIS — Z9981 Dependence on supplemental oxygen: Secondary | ICD-10-CM | POA: Diagnosis not present

## 2020-06-20 DIAGNOSIS — Z7901 Long term (current) use of anticoagulants: Secondary | ICD-10-CM | POA: Diagnosis not present

## 2020-06-20 DIAGNOSIS — R0602 Shortness of breath: Secondary | ICD-10-CM | POA: Diagnosis not present

## 2020-06-20 DIAGNOSIS — I110 Hypertensive heart disease with heart failure: Secondary | ICD-10-CM | POA: Diagnosis not present

## 2020-06-20 DIAGNOSIS — U071 COVID-19: Secondary | ICD-10-CM | POA: Diagnosis not present

## 2020-06-20 DIAGNOSIS — I517 Cardiomegaly: Secondary | ICD-10-CM | POA: Diagnosis not present

## 2020-06-20 DIAGNOSIS — Z743 Need for continuous supervision: Secondary | ICD-10-CM | POA: Diagnosis not present

## 2020-06-20 DIAGNOSIS — I509 Heart failure, unspecified: Secondary | ICD-10-CM | POA: Diagnosis not present

## 2020-06-21 DIAGNOSIS — Z9981 Dependence on supplemental oxygen: Secondary | ICD-10-CM | POA: Diagnosis not present

## 2020-06-21 DIAGNOSIS — N189 Chronic kidney disease, unspecified: Secondary | ICD-10-CM | POA: Diagnosis not present

## 2020-06-21 DIAGNOSIS — I5023 Acute on chronic systolic (congestive) heart failure: Secondary | ICD-10-CM | POA: Diagnosis not present

## 2020-06-21 DIAGNOSIS — I480 Paroxysmal atrial fibrillation: Secondary | ICD-10-CM | POA: Diagnosis not present

## 2020-06-21 DIAGNOSIS — U071 COVID-19: Secondary | ICD-10-CM | POA: Diagnosis not present

## 2020-06-21 DIAGNOSIS — J441 Chronic obstructive pulmonary disease with (acute) exacerbation: Secondary | ICD-10-CM | POA: Diagnosis not present

## 2020-06-21 DIAGNOSIS — R790 Abnormal level of blood mineral: Secondary | ICD-10-CM | POA: Diagnosis not present

## 2020-06-28 ENCOUNTER — Other Ambulatory Visit: Payer: Self-pay | Admitting: Interventional Cardiology

## 2020-06-28 ENCOUNTER — Ambulatory Visit: Payer: Medicare Other | Admitting: Interventional Cardiology

## 2020-06-30 ENCOUNTER — Telehealth: Payer: Self-pay | Admitting: Emergency Medicine

## 2020-06-30 MED ORDER — DOXYCYCLINE HYCLATE 100 MG PO TABS
100.0000 mg | ORAL_TABLET | Freq: Two times a day (BID) | ORAL | 0 refills | Status: DC
Start: 2020-06-30 — End: 2020-07-11

## 2020-06-30 MED ORDER — PREDNISONE 10 MG (21) PO TBPK: ORAL_TABLET | Freq: Every day | ORAL | 0 refills | Status: DC

## 2020-06-30 NOTE — Telephone Encounter (Signed)
I think we should treat him w doxycycline 100mg  bid x 7 days.   Also, if we did not treat with prednisone earlier in the course, then I would give him a pred taper >> Take 40mg  daily for 3 days, then 30mg  daily for 3 days, then 20mg  daily for 3 days, then 10mg  daily for 3 days, then stop

## 2020-06-30 NOTE — Telephone Encounter (Signed)
I called and spoke with patient regarding Dr. Lamonte Sakai recs. Patient verbalized understanding and I have sent in doxy and pred taper to preferred pharmacy and informed patient to call if not feeling better. Patient verbalized understanding, nothing further needed.

## 2020-06-30 NOTE — Telephone Encounter (Signed)
Spoke with the pt  He states that he continues to have cough and congestion since pos covid dx 06/15/20  He states that he is coughing up white sputum, feels he is very congested in his chest  He states that he is not having any more increased SOB or wheezing  He is not having any fever or aches  He is still taking his mucinex bid, trelegy daily and uses neb at least once per day  Tessalon did not help his cough  He asks if he needs abx  Please advise thanks  Allergies  Allergen Reactions   Benadryl [Diphenhydramine Hcl] Nausea And Vomiting

## 2020-07-03 ENCOUNTER — Telehealth: Payer: Self-pay | Admitting: Cardiovascular Disease

## 2020-07-03 NOTE — Telephone Encounter (Signed)
Spoke with the patient who reports that he is having swelling in his feet. He denies SOB. He does not have any daily weights. He is taking Lasix 80 mg BID. Encouraged patient to elevate his legs.

## 2020-07-03 NOTE — Telephone Encounter (Signed)
Pt c/o swelling: STAT is pt has developed SOB within 24 hours  If swelling, where is the swelling located? feet  How much weight have you gained and in what time span? A little  Have you gained 3 pounds in a day or 5 pounds in a week? unknown  Do you have a log of your daily weights (if so, list)? NO  Are you currently taking a fluid pill? Yes  Are you currently SOB? No  Have you traveled recently? NO

## 2020-07-03 NOTE — Telephone Encounter (Signed)
I agree.  Elevate legs so ankles are above the level of his heart.  Limit salt intake.  Compression stockings 10-20 mm Hg would be ok but may be difficult for him to put on.      JV  ----- Message -----  From: Antonieta Iba, RN  Sent: 07/03/2020  11:44 AM EDT  To: Jettie Booze, MD    Spoke with pt and made him aware of information. Pt states he has some compression stockings on now.  Advised to call back if no improvement with these changes.  Pt agreeable to plan.

## 2020-07-05 ENCOUNTER — Inpatient Hospital Stay (HOSPITAL_COMMUNITY)
Admission: EM | Admit: 2020-07-05 | Discharge: 2020-07-11 | DRG: 291 | Disposition: A | Discharge status: Home Health | Payer: Medicare Other | Attending: Internal Medicine | Admitting: Internal Medicine

## 2020-07-05 ENCOUNTER — Other Ambulatory Visit: Payer: Self-pay

## 2020-07-05 ENCOUNTER — Encounter (HOSPITAL_COMMUNITY): Payer: Self-pay

## 2020-07-05 ENCOUNTER — Ambulatory Visit: Payer: Medicare Other | Admitting: Emergency Medicine

## 2020-07-05 ENCOUNTER — Emergency Department (HOSPITAL_COMMUNITY): Payer: Medicare Other

## 2020-07-05 DIAGNOSIS — U071 COVID-19: Secondary | ICD-10-CM | POA: Diagnosis not present

## 2020-07-05 DIAGNOSIS — Z95810 Presence of automatic (implantable) cardiac defibrillator: Secondary | ICD-10-CM | POA: Diagnosis not present

## 2020-07-05 DIAGNOSIS — K219 Gastro-esophageal reflux disease without esophagitis: Secondary | ICD-10-CM | POA: Diagnosis not present

## 2020-07-05 DIAGNOSIS — E119 Type 2 diabetes mellitus without complications: Secondary | ICD-10-CM | POA: Diagnosis not present

## 2020-07-05 DIAGNOSIS — M109 Gout, unspecified: Secondary | ICD-10-CM | POA: Diagnosis present

## 2020-07-05 DIAGNOSIS — Z7901 Long term (current) use of anticoagulants: Secondary | ICD-10-CM

## 2020-07-05 DIAGNOSIS — Z8249 Family history of ischemic heart disease and other diseases of the circulatory system: Secondary | ICD-10-CM | POA: Diagnosis not present

## 2020-07-05 DIAGNOSIS — R609 Edema, unspecified: Secondary | ICD-10-CM | POA: Diagnosis present

## 2020-07-05 DIAGNOSIS — I130 Hypertensive heart and chronic kidney disease with heart failure and stage 1 through stage 4 chronic kidney disease, or unspecified chronic kidney disease: Principal | ICD-10-CM | POA: Diagnosis present

## 2020-07-05 DIAGNOSIS — Z9981 Dependence on supplemental oxygen: Secondary | ICD-10-CM | POA: Diagnosis not present

## 2020-07-05 DIAGNOSIS — Z87891 Personal history of nicotine dependence: Secondary | ICD-10-CM

## 2020-07-05 DIAGNOSIS — R0602 Shortness of breath: Secondary | ICD-10-CM | POA: Diagnosis not present

## 2020-07-05 DIAGNOSIS — J449 Chronic obstructive pulmonary disease, unspecified: Secondary | ICD-10-CM | POA: Diagnosis not present

## 2020-07-05 DIAGNOSIS — D6869 Other thrombophilia: Secondary | ICD-10-CM | POA: Diagnosis present

## 2020-07-05 DIAGNOSIS — R600 Localized edema: Secondary | ICD-10-CM | POA: Diagnosis not present

## 2020-07-05 DIAGNOSIS — Z86718 Personal history of other venous thrombosis and embolism: Secondary | ICD-10-CM | POA: Diagnosis not present

## 2020-07-05 DIAGNOSIS — J9621 Acute and chronic respiratory failure with hypoxia: Secondary | ICD-10-CM | POA: Diagnosis present

## 2020-07-05 DIAGNOSIS — Z79899 Other long term (current) drug therapy: Secondary | ICD-10-CM

## 2020-07-05 DIAGNOSIS — I442 Atrioventricular block, complete: Secondary | ICD-10-CM | POA: Diagnosis not present

## 2020-07-05 DIAGNOSIS — I480 Paroxysmal atrial fibrillation: Secondary | ICD-10-CM | POA: Diagnosis not present

## 2020-07-05 DIAGNOSIS — R791 Abnormal coagulation profile: Secondary | ICD-10-CM

## 2020-07-05 DIAGNOSIS — J9611 Chronic respiratory failure with hypoxia: Secondary | ICD-10-CM | POA: Diagnosis not present

## 2020-07-05 DIAGNOSIS — Z7952 Long term (current) use of systemic steroids: Secondary | ICD-10-CM

## 2020-07-05 DIAGNOSIS — J90 Pleural effusion, not elsewhere classified: Secondary | ICD-10-CM | POA: Diagnosis not present

## 2020-07-05 DIAGNOSIS — E1122 Type 2 diabetes mellitus with diabetic chronic kidney disease: Secondary | ICD-10-CM | POA: Diagnosis not present

## 2020-07-05 DIAGNOSIS — I509 Heart failure, unspecified: Secondary | ICD-10-CM

## 2020-07-05 DIAGNOSIS — J9811 Atelectasis: Secondary | ICD-10-CM | POA: Diagnosis not present

## 2020-07-05 DIAGNOSIS — Z888 Allergy status to other drugs, medicaments and biological substances status: Secondary | ICD-10-CM

## 2020-07-05 DIAGNOSIS — R0600 Dyspnea, unspecified: Secondary | ICD-10-CM

## 2020-07-05 DIAGNOSIS — I5043 Acute on chronic combined systolic (congestive) and diastolic (congestive) heart failure: Secondary | ICD-10-CM | POA: Diagnosis not present

## 2020-07-05 DIAGNOSIS — I517 Cardiomegaly: Secondary | ICD-10-CM | POA: Diagnosis not present

## 2020-07-05 DIAGNOSIS — I110 Hypertensive heart disease with heart failure: Secondary | ICD-10-CM | POA: Diagnosis not present

## 2020-07-05 DIAGNOSIS — N1831 Chronic kidney disease, stage 3a: Secondary | ICD-10-CM | POA: Diagnosis not present

## 2020-07-05 DIAGNOSIS — E782 Mixed hyperlipidemia: Secondary | ICD-10-CM | POA: Diagnosis present

## 2020-07-05 DIAGNOSIS — N400 Enlarged prostate without lower urinary tract symptoms: Secondary | ICD-10-CM | POA: Diagnosis present

## 2020-07-05 DIAGNOSIS — M7989 Other specified soft tissue disorders: Secondary | ICD-10-CM | POA: Diagnosis not present

## 2020-07-05 DIAGNOSIS — I2510 Atherosclerotic heart disease of native coronary artery without angina pectoris: Secondary | ICD-10-CM | POA: Diagnosis present

## 2020-07-05 DIAGNOSIS — I5023 Acute on chronic systolic (congestive) heart failure: Secondary | ICD-10-CM | POA: Diagnosis not present

## 2020-07-05 LAB — CBC
HCT: 46.8 % (ref 39.0–52.0)
Hemoglobin: 14.6 g/dL (ref 13.0–17.0)
MCH: 32.4 pg (ref 26.0–34.0)
MCHC: 31.2 g/dL (ref 30.0–36.0)
MCV: 103.8 fL — ABNORMAL HIGH (ref 80.0–100.0)
Platelets: 98 10*3/uL — ABNORMAL LOW (ref 150–400)
RBC: 4.51 MIL/uL (ref 4.22–5.81)
RDW: 17 % — ABNORMAL HIGH (ref 11.5–15.5)
WBC: 15 10*3/uL — ABNORMAL HIGH (ref 4.0–10.5)
nRBC: 0 % (ref 0.0–0.2)

## 2020-07-05 LAB — URINALYSIS, ROUTINE W REFLEX MICROSCOPIC
Bilirubin Urine: NEGATIVE
Glucose, UA: NEGATIVE mg/dL
Hgb urine dipstick: NEGATIVE
Ketones, ur: NEGATIVE mg/dL
Leukocytes,Ua: NEGATIVE
Nitrite: NEGATIVE
Protein, ur: NEGATIVE mg/dL
Specific Gravity, Urine: 1.008 (ref 1.005–1.030)
pH: 6 (ref 5.0–8.0)

## 2020-07-05 LAB — HEPATIC FUNCTION PANEL
ALT: 30 U/L (ref 0–44)
AST: 19 U/L (ref 15–41)
Albumin: 3.4 g/dL — ABNORMAL LOW (ref 3.5–5.0)
Alkaline Phosphatase: 78 U/L (ref 38–126)
Bilirubin, Direct: 0.3 mg/dL — ABNORMAL HIGH (ref 0.0–0.2)
Indirect Bilirubin: 0.8 mg/dL (ref 0.3–0.9)
Total Bilirubin: 1.1 mg/dL (ref 0.3–1.2)
Total Protein: 6.2 g/dL — ABNORMAL LOW (ref 6.5–8.1)

## 2020-07-05 LAB — TROPONIN I (HIGH SENSITIVITY)
Troponin I (High Sensitivity): 91 ng/L — ABNORMAL HIGH (ref <18)
Troponin I (High Sensitivity): 88 ng/L — ABNORMAL HIGH (ref <18)

## 2020-07-05 LAB — RESP PANEL BY RT-PCR (FLU A&B, COVID) ARPGX2
Influenza A by PCR: NEGATIVE
Influenza B by PCR: NEGATIVE
SARS Coronavirus 2 by RT PCR: POSITIVE — AB

## 2020-07-05 LAB — BASIC METABOLIC PANEL
Anion gap: 9 (ref 5–15)
BUN: 36 mg/dL — ABNORMAL HIGH (ref 8–23)
CO2: 39 mmol/L — ABNORMAL HIGH (ref 22–32)
Calcium: 9 mg/dL (ref 8.9–10.3)
Chloride: 93 mmol/L — ABNORMAL LOW (ref 98–111)
Creatinine, Ser: 1.25 mg/dL — ABNORMAL HIGH (ref 0.61–1.24)
GFR, Estimated: 58 mL/min — ABNORMAL LOW (ref >60)
Glucose, Bld: 150 mg/dL — ABNORMAL HIGH (ref 70–99)
Potassium: 3.6 mmol/L (ref 3.5–5.1)
Sodium: 141 mmol/L (ref 135–145)

## 2020-07-05 LAB — D-DIMER, QUANTITATIVE: D-Dimer, Quant: 0.31 ug/mL-FEU (ref 0.00–0.50)

## 2020-07-05 LAB — BRAIN NATRIURETIC PEPTIDE: B Natriuretic Peptide: 3380 pg/mL — ABNORMAL HIGH (ref 0.0–100.0)

## 2020-07-05 LAB — DIGOXIN LEVEL: Digoxin Level: 0.9 ng/mL (ref 0.8–2.0)

## 2020-07-05 LAB — PROCALCITONIN: Procalcitonin: <0.1 ng/mL

## 2020-07-05 LAB — PROTIME-INR
INR: 4.8 (ref 0.8–1.2)
Prothrombin Time: 45.1 seconds — ABNORMAL HIGH (ref 11.4–15.2)

## 2020-07-05 LAB — FERRITIN: Ferritin: 72 ng/mL (ref 24–336)

## 2020-07-05 MED ORDER — SODIUM CHLORIDE 0.9% FLUSH
3.0000 mL | Freq: Two times a day (BID) | INTRAVENOUS | Status: DC
  Administered 2020-07-05 – 2020-07-11 (×11): 3 mL via INTRAVENOUS

## 2020-07-05 MED ORDER — POTASSIUM CHLORIDE CRYS ER 20 MEQ PO TBCR
30.0000 meq | EXTENDED_RELEASE_TABLET | Freq: Every day | ORAL | Status: DC
  Administered 2020-07-05 – 2020-07-10 (×6): 30 meq via ORAL
  Filled 2020-07-05: qty 1
  Filled 2020-07-05: qty 3
  Filled 2020-07-05 (×5): qty 1

## 2020-07-05 MED ORDER — SODIUM CHLORIDE 0.9 % IV SOLN: 250.0000 mL | INTRAVENOUS | Status: DC | PRN

## 2020-07-05 MED ORDER — FUROSEMIDE 10 MG/ML IJ SOLN
80.0000 mg | Freq: Two times a day (BID) | INTRAMUSCULAR | Status: DC
  Administered 2020-07-05 – 2020-07-07 (×4): 80 mg via INTRAVENOUS
  Filled 2020-07-05 (×5): qty 8

## 2020-07-05 MED ORDER — FUROSEMIDE 10 MG/ML IJ SOLN
80.0000 mg | Freq: Once | INTRAMUSCULAR | Status: AC
  Administered 2020-07-05: 80 mg via INTRAVENOUS
  Filled 2020-07-05: qty 8

## 2020-07-05 MED ORDER — ONDANSETRON HCL 4 MG/2ML IJ SOLN
4.0000 mg | Freq: Four times a day (QID) | INTRAMUSCULAR | Status: DC | PRN
  Filled 2020-07-05: qty 2

## 2020-07-05 MED ORDER — ONDANSETRON HCL 4 MG/2ML IJ SOLN
4.0000 mg | Freq: Four times a day (QID) | INTRAMUSCULAR | Status: DC | PRN
  Administered 2020-07-10 (×2): 4 mg via INTRAVENOUS
  Filled 2020-07-05: qty 2

## 2020-07-05 MED ORDER — ATORVASTATIN CALCIUM 10 MG PO TABS
10.0000 mg | ORAL_TABLET | Freq: Every day | ORAL | Status: DC
  Administered 2020-07-05 – 2020-07-11 (×7): 10 mg via ORAL
  Filled 2020-07-05 (×7): qty 1

## 2020-07-05 MED ORDER — ALFUZOSIN HCL ER 10 MG PO TB24
10.0000 mg | ORAL_TABLET | Freq: Every day | ORAL | Status: DC
  Administered 2020-07-05 – 2020-07-11 (×7): 10 mg via ORAL
  Filled 2020-07-05 (×8): qty 1

## 2020-07-05 MED ORDER — FLUTICASONE PROPIONATE 50 MCG/ACT NA SUSP: 2.0000 | Freq: Every evening | NASAL | Status: DC | PRN

## 2020-07-05 MED ORDER — MAGNESIUM SULFATE IN D5W 1-5 GM/100ML-% IV SOLN
1.0000 g | Freq: Once | INTRAVENOUS | Status: AC
  Administered 2020-07-05: 1 g via INTRAVENOUS
  Filled 2020-07-05 (×2): qty 100

## 2020-07-05 MED ORDER — CARVEDILOL 12.5 MG PO TABS
12.5000 mg | ORAL_TABLET | Freq: Two times a day (BID) | ORAL | Status: DC
  Administered 2020-07-05 – 2020-07-11 (×12): 12.5 mg via ORAL
  Filled 2020-07-05 (×12): qty 1

## 2020-07-05 MED ORDER — DIGOXIN 125 MCG PO TABS
62.5000 ug | ORAL_TABLET | Freq: Every day | ORAL | Status: DC
  Administered 2020-07-06 – 2020-07-11 (×6): 62.5 ug via ORAL
  Filled 2020-07-05 (×6): qty 1

## 2020-07-05 MED ORDER — ALLOPURINOL 100 MG PO TABS
100.0000 mg | ORAL_TABLET | Freq: Every day | ORAL | Status: DC
  Administered 2020-07-05 – 2020-07-11 (×7): 100 mg via ORAL
  Filled 2020-07-05 (×7): qty 1

## 2020-07-05 MED ORDER — ACETAMINOPHEN 500 MG PO TABS
1000.0000 mg | ORAL_TABLET | Freq: Four times a day (QID) | ORAL | Status: DC | PRN
  Administered 2020-07-05 – 2020-07-10 (×4): 1000 mg via ORAL
  Filled 2020-07-05 (×4): qty 2

## 2020-07-05 MED ORDER — SODIUM CHLORIDE 0.9% FLUSH: 3.0000 mL | INTRAVENOUS | Status: DC | PRN

## 2020-07-05 MED ORDER — LORATADINE 10 MG PO TABS
10.0000 mg | ORAL_TABLET | Freq: Every day | ORAL | Status: DC
  Administered 2020-07-05 – 2020-07-11 (×7): 10 mg via ORAL
  Filled 2020-07-05 (×7): qty 1

## 2020-07-05 NOTE — Progress Notes (Addendum)
ANTICOAGULATION CONSULT NOTE - Initial Consult  Pharmacy Consult for warfarin Indication: atrial fibrillation  Allergies  Allergen Reactions   Benadryl [Diphenhydramine Hcl] Nausea And Vomiting    Patient Measurements: Height: 5\' 6"  (167.6 cm) Weight: 74.8 kg (165 lb) IBW/kg (Calculated) : 63.8 Heparin Dosing Weight:   Vital Signs: BP: 148/99 (06/15 1230) Pulse Rate: 71 (06/15 1230)  Labs: Recent Labs    07/05/20 1046 07/05/20 1117  LABPROT  --  45.1*  INR  --  4.8*  CREATININE 1.25*  --   TROPONINIHS 91*  --     Estimated Creatinine Clearance: 42.5 mL/min (A) (by C-G formula based on SCr of 1.25 mg/dL (H)).   Medical History: Past Medical History:  Diagnosis Date   Atrial fibrillation ([place])    CAD (coronary artery disease) 1996   status post PCI of the RCA    Community acquired pneumonia 08/24/2015   Congestive heart failure, unspecified    COPD (chronic obstructive pulmonary disease) (HCC)    Pt on home O2 at night and PRN, unsure of date of diagnosis.   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  INR on admission is supratherapeutic at 4.8.  Home dose listed as 1 mg on Wed and 2 mg ROW.  Goal of Therapy:  INR 2-3 Monitor platelets by anticoagulation protocol: Yes   Plan:  Hold warfarin x 1 dose. Monitor daily INR and s/s of bleeding.  Margot Ables, PharmD Clinical Pharmacist 07/05/2020 1:02 PM

## 2020-07-05 NOTE — ED Triage Notes (Addendum)
Pt presents to ED with complaints of bilateral leg swelling x couple of days. Pt with hx CHF. Pt chronically on 3L of O2 at home, Pt O2 tank noted to be empty upon arrival to ED. Pt states he tested positive for covid over 3 weeks ago.

## 2020-07-05 NOTE — ED Provider Notes (Signed)
[facility] Provider Note   CSN: [id number] Arrival date & time: 07/05/20  1023     History Chief Complaint  Patient presents with   Leg Swelling    Troy [name] is a 80 y.o. male.  Pt presents to the ED today with sob and leg swelling.  The pt said sx have been going on for a few days.  He does wear 3L oxygen chronically.  His tank was out when he arrived here.  He said he had Covid about 3 weeks ago.  Tehre was a prescription called in for Paxlovid on 5/27.  He has been on doxy and prednisone since 6/10.  He has been vaccinated and boosted.  He denies any fever.  He has been compliant with his lasix, but feels like he needs a higher dose.  He is followed by [name] in [place].      Past Medical History:  Diagnosis Date   Atrial fibrillation ([place])    CAD (coronary artery disease) 1996   status post PCI of the RCA    Community acquired pneumonia 08/24/2015   Congestive heart failure, unspecified    COPD (chronic obstructive pulmonary disease) (HCC)    Pt on home O2 at night and PRN, unsure of date of diagnosis.   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[place])    Nephrolithiasis    Other primary cardiomyopathies    Personal history of DVT (deep vein thrombosis)    Prostatitis    Shingles     Patient Active Problem List   Diagnosis Date Noted   Secondary hypercoagulable state ([place]) 11/05/2019   Chronic respiratory failure with hypoxia ([place]) 01/31/2017   Acute on chronic systolic CHF (congestive heart failure) ([place]) 01/31/2017   Allergic rhinitis 12/05/2016   Hoarseness 11/04/2016   Controlled type 2 diabetes mellitus without complication, without long-term current use of insulin ([place]) 09/19/2016   High risk medications (not anticoagulants) long-term use 09/19/2016   History of ASCVD 09/19/2016   Mixed  hyperlipidemia 09/19/2016   Sudden visual loss of left eye 09/19/2016   Lower GI bleed 09/11/2015   Bright red blood per rectum 09/11/2015   BPH (benign prostatic hyperplasia) 12/25/2014   FTT (failure to thrive) in adult 12/25/2014   Encounter for therapeutic drug monitoring 08/05/2014   Pulmonary nodules 12/23/2013   Hemoptysis    Dyspnea    Chronic kidney disease, stage 3 ([place])    Coronary artery disease involving native coronary artery of native heart without angina pectoris    Paroxysmal atrial fibrillation (HCC)    Frequent PVCs    Hypoxia    COPD (chronic obstructive pulmonary disease) ([place]) 06/16/2013   Lipoma of neck 09/11/2011   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   Chronic atrial fibrillation ([place]) 50/27/7412   Chronic systolic CHF (congestive heart failure) ([place])    Cardiomyopathy, primary ([place])    Diverticulosis of colon with hemorrhage 08/28/2010    Past Surgical History:  Procedure Laterality Date   BACK SURGERY     BIV ICD GENERATOR CHANGEOUT N/A 12/14/2018   Procedure: BIV ICD GENERATOR CHANGEOUT;  Surgeon: Evans Lance, MD;  Location: [facility];  Service: Cardiovascular;  Laterality: N/A;   CARDIAC DEFIBRILLATOR PLACEMENT     CORONARY STENT PLACEMENT     PACEMAKER INSERTION  Family History  Problem Relation Age of Onset   Heart disease Father    Cancer Father        LUNG   Colon cancer Mother    Stroke Paternal Uncle    Heart attack Neg Hx    Hyperlipidemia Neg Hx    Hypertension Neg Hx     Social History   Tobacco Use   Smoking status: Former    Packs/day: 4.00    Years: 50.00    Pack years: 200.00    Types: Cigarettes    Quit date: 01/22/1980    Years since quitting: 40.4   Smokeless tobacco: Never  Vaping Use   Vaping Use: Never used  Substance Use Topics   Alcohol use: No    Alcohol/week: 0.0 standard drinks    Comment: denies   Drug use: No    Home Medications Prior to  Admission medications   Medication Sig Start Date End Date Taking? Authorizing Provider  carvedilol (COREG) 12.5 MG tablet TAKE ONE TABLET BY MOUTH TWICE DAILY. TAKE WITH A MEAL. 06/05/20   Jettie Booze, MD  acetaminophen (TYLENOL) 500 MG tablet Take 1,000 mg by mouth every 6 (six) hours as needed for moderate pain.     [provider]  albuterol (ACCUNEB) 1.25 MG/3ML nebulizer solution INHALE CONTENTS OF 1 VIAL IN NEBULIZER EVERY 6 HOURS AS NEEDED. 10/29/19   Collene Gobble, MD  albuterol (VENTOLIN HFA) 108 (90 Base) MCG/ACT inhaler INHALE 1-2 PUFFS INTO THE LUNGS EVERY 6 HOURS AS NEEDED FOR WHEEZING OR SHORTNESS OF BREATH 01/11/20   Collene Gobble, MD  alfuzosin (UROXATRAL) 10 MG 24 hr tablet Take 10 mg by mouth daily. 02/23/19   [provider]  allopurinol (ZYLOPRIM) 100 MG tablet Take 1 tablet (100 mg total) daily by mouth. 11/29/16   Caren Macadam, MD  atorvastatin (LIPITOR) 10 MG tablet TAKE ONE TABLET BY MOUTH DAILY 07/16/19   Jettie Booze, MD  benzonatate (TESSALON) 100 MG capsule Take 1 capsule (100 mg total) by mouth every 6 (six) hours as needed for cough. 06/15/20   Collene Gobble, MD  cetirizine (ZYRTEC) 10 MG tablet Take 10 mg by mouth daily.    [provider]  digoxin (LANOXIN) 0.125 MG tablet TAKE 1/2 TABLET BY MOUTH DAILY 05/11/20   Jettie Booze, MD  doxycycline (VIBRA-TABS) 100 MG tablet Take 1 tablet (100 mg total) by mouth 2 (two) times daily. 06/30/20   Collene Gobble, MD  fluticasone (FLONASE) 50 MCG/ACT nasal spray Place 2 sprays into both nostrils at bedtime as needed for allergies. 10/22/16   Caren Macadam, MD  Fluticasone-Umeclidin-Vilant (TRELEGY ELLIPTA) 100-62.5-25 MCG/INH AEPB INHALE ONE PUFF INTO THE LUNGS DAILY 05/11/20   Collene Gobble, MD  furosemide (LASIX) 40 MG tablet Take 2 tablets (80 mg total) by mouth 2 (two) times daily. 05/17/20   Jettie Booze, MD  potassium chloride SA (KLOR-CON) 20 MEQ tablet Take 1.5  tablets (30 mEq total) by mouth daily. 12/25/18   Evans Lance, MD  predniSONE (STERAPRED UNI-PAK 21 TAB) 10 MG (21) TBPK tablet Take by mouth daily. Take 40mg  daily for 3 days, then 30mg  daily for 3 days, then 20mg  daily for 3 days, then 10mg  daily for 3 days, then stop 06/30/20   Collene Gobble, MD  triamcinolone cream (KENALOG) 0.1 % Apply topically 2 (two) times daily. to affected area 06/07/19   [provider]  warfarin (COUMADIN) 1 MG tablet TAKE TWO (  2) TABLETS BY MOUTH EVERY DAY OR USE AS DIRECTED 06/29/20   Jettie Booze, MD    Allergies    Benadryl [diphenhydramine hcl]  Review of Systems   Review of Systems  Respiratory:  Positive for shortness of breath.   Cardiovascular:  Positive for leg swelling.  All other systems reviewed and are negative.  Physical Exam Updated Vital Signs BP (!) 148/99   Pulse 71   Resp (!) 26   Ht 5\' 6"  (1.676 m)   Wt 74.8 kg   SpO2 98%   BMI 26.63 kg/m   Physical Exam Vitals and nursing note reviewed.  Constitutional:      Appearance: Normal appearance.  HENT:     Head: Normocephalic and atraumatic.     Right Ear: External ear normal.     Left Ear: External ear normal.     Nose: Nose normal.     Mouth/Throat:     Mouth: Mucous membranes are moist.     Pharynx: Oropharynx is clear.  Eyes:     Conjunctiva/sclera: Conjunctivae normal.  Cardiovascular:     Rate and Rhythm: Normal rate. Rhythm irregular.     Pulses: Normal pulses.     Heart sounds: Normal heart sounds.  Pulmonary:     Effort: Tachypnea present.     Breath sounds: Rhonchi present.  Abdominal:     General: Abdomen is flat. Bowel sounds are normal.     Palpations: Abdomen is soft.  Musculoskeletal:        General: Normal range of motion.     Cervical back: Normal range of motion and neck supple.     Right lower leg: Edema present.     Left lower leg: Edema present.  Skin:    General: Skin is warm.     Capillary Refill: Capillary refill takes less  than 2 seconds.  Neurological:     General: No focal deficit present.     Mental Status: He is alert and oriented to person, place, and time.  Psychiatric:        Mood and Affect: Mood normal.        Behavior: Behavior normal.        Thought Content: Thought content normal.    ED Results / Procedures / Treatments   Labs (all labs ordered are listed, but only abnormal results are displayed) Labs Reviewed  RESP PANEL BY RT-PCR (FLU A&B, COVID) ARPGX2 - Abnormal; Notable for the following components:      Result Value   SARS Coronavirus 2 by RT PCR POSITIVE (*)    All other components within normal limits  BRAIN NATRIURETIC PEPTIDE - Abnormal; Notable for the following components:   B Natriuretic Peptide 3,380.0 (*)    All other components within normal limits  BASIC METABOLIC PANEL - Abnormal; Notable for the following components:   Chloride 93 (*)    CO2 39 (*)    Glucose, Bld 150 (*)    BUN 36 (*)    Creatinine, Ser 1.25 (*)    GFR, Estimated 58 (*)    All other components within normal limits  HEPATIC FUNCTION PANEL - Abnormal; Notable for the following components:   Total Protein 6.2 (*)    Albumin 3.4 (*)    Bilirubin, Direct 0.3 (*)    All other components within normal limits  PROTIME-INR - Abnormal; Notable for the following components:   Prothrombin Time 45.1 (*)    INR 4.8 (*)    All other  components within normal limits  TROPONIN I (HIGH SENSITIVITY) - Abnormal; Notable for the following components:   Troponin I (High Sensitivity) 91 (*)    All other components within normal limits  DIGOXIN LEVEL  URINALYSIS, ROUTINE W REFLEX MICROSCOPIC  TROPONIN I (HIGH SENSITIVITY)    EKG EKG Interpretation  Date/Time:  Wednesday July 05 2020 10:39:39 EDT Ventricular Rate:  70 PR Interval:    QRS Duration: 138 QT Interval:  380 QTC Calculation: 410 R Axis:   250 Text Interpretation: av dual paced rhythm No significant change since last tracing Confirmed by Isla Pence [phone]) on 07/05/2020 11:02:45 AM  Radiology DG Chest Port 1 View  Result Date: 07/05/2020 CLINICAL DATA:  Shortness of breath and leg swelling EXAM: PORTABLE CHEST 1 VIEW COMPARISON:  06/20/2020 FINDINGS: Cardiac shadow is enlarged but stable. Defibrillator is again noted. Aortic calcifications are seen. Vascular congestion has improved somewhat in the interval from the prior exam. Increasing right-sided pleural effusion is noted with underlying basilar infiltrate. IMPRESSION: Increasing right-sided effusion with underlying right basilar infiltrate. Previously seen vascular congestion has improved in the interval from the prior exam. Electronically Signed   By: Inez Catalina M.D.   On: 07/05/2020 11:23    Procedures Procedures   Medications Ordered in ED Medications  furosemide (LASIX) injection 80 mg (80 mg Intravenous Given 07/05/20 1053)    ED Course  I have reviewed the triage vital signs and the nursing notes.  Pertinent labs & imaging results that were available during my care of the patient were reviewed by me and considered in my medical decision making (see chart for details).    MDM Rules/Calculators/A&P                          Pt is still testing positive for Covid, but he said he tested positive on 5/26.  He has taken Paxlovid, doxy, and prednisone, mucinex, tessalon.  SOB today is likely due to CHF.    Pt has pleural effusions and is very sob with movement, but is not hypoxic. BNP is elevated.  Troponin is slightly elevated likely due to strain from sob.  Pt d/w Dr. Carles Collet ([facility]ad) for admission.  Lupita Dawn was evaluated in Emergency Department on 07/05/2020 for the symptoms described in the history of present illness. He was evaluated in the context of the global COVID-19 pandemic, which necessitated consideration that the patient might be at risk for infection with the SARS-CoV-2 virus that causes COVID-19. Institutional protocols and algorithms that pertain to  the evaluation of patients at risk for COVID-19 are in a state of rapid change based on information released by regulatory bodies including the CDC and federal and state organizations. These policies and algorithms were followed during the patient's care in the ED.  Final Clinical Impression(s) / ED Diagnoses Final diagnoses:  Acute on chronic congestive heart failure, unspecified heart failure type ([place])  Peripheral edema  COVID-19  Elevated INR    Rx / DC Orders ED Discharge Orders     None        Isla Pence, MD 07/05/20 1238

## 2020-07-05 NOTE — H&P (Signed)
History and Physical  [name] DTO:[id number] DOB: 09-Sep-1940 DOA: 07/05/2020   PCP: Patient, No Pcp Per (Inactive)   Patient coming from: Home  Chief Complaint: sob  HPI:  Troy [name] is a 80 y.o. male with medical history of NICM with EF 25-30%; CKD stage III, COPD, proximal atrial fibrillation, chronic respiratory failure on 3 L, impaired glucose tolerance, coronary artery disease, hyperlipidemia presenting with 2 to 3-day history of shortness of breath and worsening lower extremity edema.  The patient states that he was recently admitted to [facility] for the same.  Review of the medical record shows that the patient was recently admitted to [facility] from 06/20/2020  to 06/21/2020 for acute on chronic systolic CHF.  He was discharged home on furosemide 40 mg twice daily.  However, according to the medical record he is usually on furosemide 80 mg twice daily.  Nevertheless, the patient states that he tested positive for COVID approximately 3 to 4 days prior to the hospitalization at [facility].  He has been vaccinated and boosted.  He denies any fevers, chills, headache, sore throat, nausea, vomiting or diarrhea, abdominal pain, dysuria, hematuria.  He has a nonproductive cough which has been chronic.  He endorses compliance with his medications. In the emergency department, the patient was afebrile hemodynamically stable with oxygen saturation 100% on 3 L.  BMP shows sodium 141, potassium 3.6, serum creatinine 1.25.  LFTs were unremarkable.  BNP was 3380.  Chest x-ray showed increasing right pleural effusion with increased interstitial markings.  The patient was treated with furosemide 80 mg IV.  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 Crissie Sickles  Chronic respiratory failure with hypoxia -Patient states that he is chronically on 3 L nasal cannula  Lines and -Rate controlled -Continue warfarin  Mild right -INR supratherapeutic at the time of admission -PharmD to assist with dosing warfarin  Coronary artery disease/elevated troponin -Elevated troponin likely secondary to CHF exacerbation -No chest pain presently -EKG with paced rhythm  Essential hypertension -Continue carvedilol  COPD  -Continue bronchodilators -No wheezing presently  HLD -Continue statin  Impaired glucose tolerance -check A1C       Past Medical History:  Diagnosis Date   Atrial fibrillation (HCC)    CAD (coronary artery disease) 1996   status post PCI of the RCA    Community acquired pneumonia 08/24/2015   Congestive heart failure, unspecified    COPD (chronic obstructive pulmonary disease) ([place])    Pt on home O2 at night and PRN, unsure of date of diagnosis.   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HCC)    Nephrolithiasis    Other primary cardiomyopathies    Personal history of DVT (deep vein thrombosis)    Prostatitis    Shingles    Past Surgical History:  Procedure Laterality Date  BACK SURGERY     BIV ICD GENERATOR CHANGEOUT N/A 12/14/2018   Procedure: BIV ICD GENERATOR CHANGEOUT;  Surgeon: Evans Lance, MD;  Location: [place] [facility];  Service: Cardiovascular;  Laterality: N/A;   CARDIAC DEFIBRILLATOR PLACEMENT     CORONARY STENT PLACEMENT     PACEMAKER INSERTION     Social History:  reports that he  quit smoking about 40 years ago. His smoking use included cigarettes. He has a 200.00 pack-year smoking history. He has never used smokeless tobacco. He reports that he does not drink alcohol and does not use drugs.   Family History  Problem Relation Age of Onset   Heart disease Father    Cancer Father        LUNG   Colon cancer Mother    Stroke Paternal Uncle    Heart attack Neg Hx    Hyperlipidemia Neg Hx    Hypertension Neg Hx      Allergies  Allergen Reactions   Benadryl [Diphenhydramine Hcl] Nausea And Vomiting     Prior to Admission medications   Medication Sig Start Date End Date Taking? Authorizing Provider  carvedilol (COREG) 12.5 MG tablet TAKE ONE TABLET BY MOUTH TWICE DAILY. TAKE WITH A MEAL. 06/05/20   Jettie Booze, MD  acetaminophen (TYLENOL) 500 MG tablet Take 1,000 mg by mouth every 6 (six) hours as needed for moderate pain.     [provider]  albuterol (ACCUNEB) 1.25 MG/3ML nebulizer solution INHALE CONTENTS OF 1 VIAL IN NEBULIZER EVERY 6 HOURS AS NEEDED. 10/29/19   Collene Gobble, MD  albuterol (VENTOLIN HFA) 108 (90 Base) MCG/ACT inhaler INHALE 1-2 PUFFS INTO THE LUNGS EVERY 6 HOURS AS NEEDED FOR WHEEZING OR SHORTNESS OF BREATH 01/11/20   Collene Gobble, MD  alfuzosin (UROXATRAL) 10 MG 24 hr tablet Take 10 mg by mouth daily. 02/23/19   [provider]  allopurinol (ZYLOPRIM) 100 MG tablet Take 1 tablet (100 mg total) daily by mouth. 11/29/16   Caren Macadam, MD  atorvastatin (LIPITOR) 10 MG tablet TAKE ONE TABLET BY MOUTH DAILY 07/16/19   Jettie Booze, MD  benzonatate (TESSALON) 100 MG capsule Take 1 capsule (100 mg total) by mouth every 6 (six) hours as needed for cough. 06/15/20   Collene Gobble, MD  cetirizine (ZYRTEC) 10 MG tablet Take 10 mg by mouth daily.    [provider]  digoxin (LANOXIN) 0.125 MG tablet TAKE 1/2 TABLET BY MOUTH DAILY 05/11/20   Jettie Booze, MD  doxycycline (VIBRA-TABS) 100 MG tablet Take  1 tablet (100 mg total) by mouth 2 (two) times daily. 06/30/20   Collene Gobble, MD  fluticasone (FLONASE) 50 MCG/ACT nasal spray Place 2 sprays into both nostrils at bedtime as needed for allergies. 10/22/16   Caren Macadam, MD  Fluticasone-Umeclidin-Vilant (TRELEGY ELLIPTA) 100-62.5-25 MCG/INH AEPB INHALE ONE PUFF INTO THE LUNGS DAILY 05/11/20   Collene Gobble, MD  furosemide (LASIX) 40 MG tablet Take 2 tablets (80 mg total) by mouth 2 (two) times daily. 05/17/20   Jettie Booze, MD  potassium chloride SA (KLOR-CON) 20 MEQ tablet Take 1.5 tablets (30 mEq total) by mouth daily. 12/25/18   Evans Lance, MD  predniSONE (STERAPRED UNI-PAK 21 TAB) 10 MG (21) TBPK tablet Take by mouth daily. Take 40mg  daily for 3 days, then 30mg  daily for 3 days, then 20mg  daily for 3 days, then 10mg  daily for 3 days, then stop 06/30/20   Baltazar Apo  S, MD  triamcinolone cream (KENALOG) 0.1 % Apply topically 2 (two) times daily. to affected area 06/07/19   [provider]  warfarin (COUMADIN) 1 MG tablet TAKE TWO (2) TABLETS BY MOUTH EVERY DAY OR USE AS DIRECTED 06/29/20   Jettie Booze, MD    Review of Systems:  Constitutional:  No weight loss, night sweats, Fevers, chills, fatigue.  Head&Eyes: No headache.  No vision loss.  No eye pain or scotoma ENT:  No Difficulty swallowing,Tooth/dental problems,Sore throat,  No ear ache, post nasal drip,  Cardio-vascular:  No chest pain, Orthopnea, PND, swelling in lower extremities,  dizziness, palpitations  GI:  No  abdominal pain, nausea, vomiting, diarrhea, loss of appetite, hematochezia, melena, heartburn, indigestion, Resp:  No shortness of breath with exertion or at rest. No cough. No coughing up of blood .No wheezing.No chest wall deformity  Skin:  no rash or lesions.  GU:  no dysuria, change in color of urine, no urgency or frequency. No flank pain.  Musculoskeletal:  No joint pain or swelling. No decreased range of motion. No back pain.   Psych:  No change in mood or affect. No depression or anxiety. Neurologic: No headache, no dysesthesia, no focal weakness, no vision loss. No syncope  Physical Exam: Vitals:   07/05/20 1105 07/05/20 1130 07/05/20 1200 07/05/20 1230  BP: (!) 158/79 (!) 147/61 (!) 151/64 (!) 148/99  Pulse: (!) 40 70 68 71  Resp: (!) 21 (!) 25 20 (!) 26  SpO2: 95% 99% 100% 98%  Weight:      Height:       General:  A&O x 3, NAD, nontoxic, pleasant/cooperative Head/Eye: No conjunctival hemorrhage, no icterus, [place]/AT, No nystagmus ENT:  No icterus,  No thrush, good dentition, no pharyngeal exudate Neck:  No masses, no lymphadenpathy, no bruits +JVD CV: irregular, no rub, no gallop, no S3 Lung:  diminished BS.  Bibasilar rales Abdomen: soft/NT, +BS, nondistended, no peritoneal signs Ext: No cyanosis, No rashes, No petechiae, No lymphangitis, 2 + LEedema Neuro: CNII-XII intact, strength 4/5 in bilateral upper and lower extremities, no dysmetria  Labs on Admission:  Basic Metabolic Panel: Recent Labs  Lab 07/05/20 1046  NA 141  K 3.6  CL 93*  CO2 39*  GLUCOSE 150*  BUN 36*  CREATININE 1.25*  CALCIUM 9.0   Liver Function Tests: Recent Labs  Lab 07/05/20 1046  AST 19  ALT 30  ALKPHOS 78  BILITOT 1.1  PROT 6.2*  ALBUMIN 3.4*   No results for input(s): LIPASE, AMYLASE in the last 168 hours. No results for input(s): AMMONIA in the last 168 hours. CBC: No results for input(s): WBC, NEUTROABS, HGB, HCT, MCV, PLT in the last 168 hours. Coagulation Profile: Recent Labs  Lab 07/05/20 1117  INR 4.8*   Cardiac Enzymes: No results for input(s): CKTOTAL, CKMB, CKMBINDEX, TROPONINI in the last 168 hours. BNP: Invalid input(s): POCBNP CBG: No results for input(s): GLUCAP in the last 168 hours. Urine analysis:    Component Value Date/Time   COLORURINE YELLOW 01/30/2017 [place] 01/30/2017 1545   LABSPEC 1.006 01/30/2017 1545   PHURINE 7.0 01/30/2017 1545   GLUCOSEU  NEGATIVE 01/30/2017 1545   HGBUR NEGATIVE 01/30/2017 1545   BILIRUBINUR NEGATIVE 01/30/2017 1545   KETONESUR NEGATIVE 01/30/2017 1545   PROTEINUR NEGATIVE 01/30/2017 1545   UROBILINOGEN 0.2 11/21/2013 1946   NITRITE NEGATIVE 01/30/2017 1545   LEUKOCYTESUR NEGATIVE 01/30/2017 1545   Sepsis Labs: @LABRCNTIP (procalcitonin:4,lacticidven:4) ) Recent Results (from the  past 240 hour(s))  Resp Panel by RT-PCR (Flu A&B, Covid) Nasopharyngeal Swab     Status: Abnormal   Collection Time: 07/05/20 11:02 AM   Specimen: Nasopharyngeal Swab; Nasopharyngeal(NP) swabs in vial transport medium  Result Value Ref Range Status   SARS Coronavirus 2 by RT PCR POSITIVE (A) NEGATIVE Final    Comment: RESULT CALLED TO, READ BACK BY AND VERIFIED WITH: M. CUGINO 07/05/20 @1202  BY S. BEARD (NOTE) SARS-CoV-2 target nucleic acids are DETECTED.  The SARS-CoV-2 RNA is generally detectable in upper respiratory specimens during the acute phase of infection. Positive results are indicative of the presence of the identified virus, but do not rule out bacterial infection or co-infection with other pathogens not detected by the test. Clinical correlation with patient history and other diagnostic information is necessary to determine patient infection status. The expected result is Negative.  Fact Sheet for Patients: EntrepreneurPulse.com.au  Fact Sheet for Healthcare Providers: IncredibleEmployment.be  This test is not yet approved or cleared by the [place] FDA and  has been authorized for detection and/or diagnosis of SARS-CoV-2 by FDA under an Emergency Use Authorization (EUA).  This EUA will remain in effect (meaning this test can  be used) for the duration of  the COVID-19 declaration under Section 564(b)(1) of the Act, 21 U.S.C. section 360bbb-3(b)(1), unless the authorization is terminated or revoked sooner.     Influenza A by PCR NEGATIVE NEGATIVE Final    Influenza B by PCR NEGATIVE NEGATIVE Final    Comment: (NOTE) The Xpert Xpress SARS-CoV-2/FLU/RSV plus assay is intended as an aid in the diagnosis of influenza from Nasopharyngeal swab specimens and should not be used as a sole basis for treatment. Nasal washings and aspirates are unacceptable for Xpert Xpress SARS-CoV-2/FLU/RSV testing.  Fact Sheet for Patients: EntrepreneurPulse.com.au  Fact Sheet for Healthcare Providers: IncredibleEmployment.be  This test is not yet approved or cleared by the [place] FDA and has been authorized for detection and/or diagnosis of SARS-CoV-2 by FDA under an Emergency Use Authorization (EUA). This EUA will remain in effect (meaning this test can be used) for the duration of the COVID-19 declaration under Section 564(b)(1) of the Act, 21 U.S.C. section 360bbb-3(b)(1), unless the authorization is terminated or revoked.  Performed at [facility], [address] [place], [place] [address]      Radiological Exams on Admission: DG Chest [facility] 1 View  Result Date: 07/05/2020 CLINICAL DATA:  Shortness of breath and leg swelling EXAM: PORTABLE CHEST 1 VIEW COMPARISON:  06/20/2020 FINDINGS: Cardiac shadow is enlarged but stable. Defibrillator is again noted. Aortic calcifications are seen. Vascular congestion has improved somewhat in the interval from the prior exam. Increasing right-sided pleural effusion is noted with underlying basilar infiltrate. IMPRESSION: Increasing right-sided effusion with underlying right basilar infiltrate. Previously seen vascular congestion has improved in the interval from the prior exam. Electronically Signed   By: Inez Catalina M.D.   On: 07/05/2020 11:23    EKG: Independently reviewed.paced    Time spent:60 minutes Code Status:  FULL Family Communication:  No Family at bedside Disposition Plan: expect 2-3 day hospitalization Consults called: none  DVT Prophylaxis:  coumadin  Orson Eva, DO  [facility] Pager [phone]  If 7PM-7AM, please contact night-coverage [email or web address] Password [facility] 07/05/2020, 12:53 PM

## 2020-07-06 ENCOUNTER — Other Ambulatory Visit (HOSPITAL_COMMUNITY): Payer: Medicare Other

## 2020-07-06 DIAGNOSIS — I5023 Acute on chronic systolic (congestive) heart failure: Secondary | ICD-10-CM

## 2020-07-06 DIAGNOSIS — I480 Paroxysmal atrial fibrillation: Secondary | ICD-10-CM | POA: Diagnosis not present

## 2020-07-06 LAB — C-REACTIVE PROTEIN: CRP: 0.8 mg/dL (ref <1.0)

## 2020-07-06 LAB — PROTIME-INR
INR: 4.9 (ref 0.8–1.2)
Prothrombin Time: 45.4 seconds — ABNORMAL HIGH (ref 11.4–15.2)

## 2020-07-06 LAB — BASIC METABOLIC PANEL
Anion gap: 5 (ref 5–15)
BUN: 37 mg/dL — ABNORMAL HIGH (ref 8–23)
CO2: 42 mmol/L — ABNORMAL HIGH (ref 22–32)
Calcium: 8.7 mg/dL — ABNORMAL LOW (ref 8.9–10.3)
Chloride: 96 mmol/L — ABNORMAL LOW (ref 98–111)
Creatinine, Ser: 1.36 mg/dL — ABNORMAL HIGH (ref 0.61–1.24)
GFR, Estimated: 53 mL/min — ABNORMAL LOW (ref >60)
Glucose, Bld: 112 mg/dL — ABNORMAL HIGH (ref 70–99)
Potassium: 4.2 mmol/L (ref 3.5–5.1)
Sodium: 143 mmol/L (ref 135–145)

## 2020-07-06 MED ORDER — LIVING BETTER WITH HEART FAILURE BOOK: Freq: Once | Status: AC

## 2020-07-06 NOTE — Progress Notes (Signed)
Notify MD that patient had a 6 beat run of Vtach.

## 2020-07-06 NOTE — Progress Notes (Signed)
ANTICOAGULATION CONSULT NOTE - Pharmacy Consult for warfarin Indication: atrial fibrillation  Allergies  Allergen Reactions   Benadryl [Diphenhydramine Hcl] Nausea And Vomiting    Patient Measurements: Height: 5\' 6"  (167.6 cm) Weight: 78.4 kg (172 lb 13.5 oz) IBW/kg (Calculated) : 63.8 Heparin Dosing Weight:   Vital Signs: Temp: 97.7 F (36.5 C) (06/16 0529) Temp Source: Oral (06/16 0529) BP: 134/69 (06/16 0529) Pulse Rate: 69 (06/16 0529)  Labs: Recent Labs    07/05/20 1046 07/05/20 1117 07/05/20 1256 07/06/20 0513  HGB  --   --  14.6  --   HCT  --   --  46.8  --   PLT  --   --  98*  --   LABPROT  --  45.1*  --  45.4*  INR  --  4.8*  --  4.9*  CREATININE 1.25*  --   --  1.36*  TROPONINIHS 91*  --  88*  --      Estimated Creatinine Clearance: 42.6 mL/min (A) (by C-G formula based on SCr of 1.36 mg/dL (H)).   Medical History: Past Medical History:  Diagnosis Date   Atrial fibrillation ([place])    CAD (coronary artery disease) 1996   status post PCI of the RCA    Community acquired pneumonia 08/24/2015   Congestive heart failure, unspecified    COPD (chronic obstructive pulmonary disease) (HCC)    Pt on home O2 at night and PRN, unsure of date of diagnosis.   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HCC)    Nephrolithiasis    Other primary cardiomyopathies    Personal history of DVT (deep vein thrombosis)    Prostatitis    Shingles     Medications:  Medications Prior to Admission  Medication Sig Dispense Refill Last Dose   carvedilol (COREG) 12.5 MG tablet TAKE ONE TABLET BY MOUTH TWICE DAILY. TAKE WITH A MEAL. 180 tablet 1    acetaminophen (TYLENOL) 500 MG tablet Take 1,000 mg by mouth every 6 (six) hours as needed for moderate pain.       albuterol (ACCUNEB) 1.25 MG/3ML nebulizer solution INHALE CONTENTS OF 1 VIAL IN  NEBULIZER EVERY 6 HOURS AS NEEDED. 360 mL 5    albuterol (VENTOLIN HFA) 108 (90 Base) MCG/ACT inhaler INHALE 1-2 PUFFS INTO THE LUNGS EVERY 6 HOURS AS NEEDED FOR WHEEZING OR SHORTNESS OF BREATH 8.5 g 5    alfuzosin (UROXATRAL) 10 MG 24 hr tablet Take 10 mg by mouth daily.      allopurinol (ZYLOPRIM) 100 MG tablet Take 1 tablet (100 mg total) daily by mouth. 90 tablet 0    atorvastatin (LIPITOR) 10 MG tablet TAKE ONE TABLET BY MOUTH DAILY 30 tablet 11    benzonatate (TESSALON) 100 MG capsule Take 1 capsule (100 mg total) by mouth every 6 (six) hours as needed for cough. 30 capsule 1    cetirizine (ZYRTEC) 10 MG tablet Take 10 mg by mouth daily.      digoxin (LANOXIN) 0.125 MG tablet TAKE 1/2 TABLET BY MOUTH DAILY 45 tablet 2    doxycycline (VIBRA-TABS) 100 MG tablet Take 1 tablet (100 mg total) by mouth 2 (two) times daily. 14 tablet 0    fluticasone (FLONASE) 50 MCG/ACT nasal spray Place 2 sprays into both nostrils at bedtime as needed for allergies. 16 g 3  Fluticasone-Umeclidin-Vilant (TRELEGY ELLIPTA) 100-62.5-25 MCG/INH AEPB INHALE ONE PUFF INTO THE LUNGS DAILY 60 each 1    furosemide (LASIX) 40 MG tablet Take 2 tablets (80 mg total) by mouth 2 (two) times daily. 120 tablet 3    potassium chloride SA (KLOR-CON) 20 MEQ tablet Take 1.5 tablets (30 mEq total) by mouth daily. 135 tablet 3    predniSONE (STERAPRED UNI-PAK 21 TAB) 10 MG (21) TBPK tablet Take by mouth daily. Take 40mg  daily for 3 days, then 30mg  daily for 3 days, then 20mg  daily for 3 days, then 10mg  daily for 3 days, then stop 30 tablet 0    triamcinolone cream (KENALOG) 0.1 % Apply topically 2 (two) times daily. to affected area      warfarin (COUMADIN) 1 MG tablet TAKE TWO (2) TABLETS BY MOUTH EVERY DAY OR USE AS DIRECTED 65 tablet 3     Assessment: Pharmacy consulted to dose warfarin in patient with atrial fibrillation.  INR on admission is supratherapeutic at 4.8.  Home dose listed as 1 mg on Wed and 2 mg ROW.  INR  4.9  Goal of Therapy:  INR 2-3 Monitor platelets by anticoagulation protocol: Yes   Plan:  Hold warfarin x 1 dose. Monitor daily INR and s/s of bleeding.  Margot Ables, PharmD Clinical Pharmacist 07/06/2020 8:41 AM

## 2020-07-06 NOTE — Progress Notes (Signed)
PROGRESS NOTE    Troy [name]  UXL:[id number] DOB: 04-25-1940 DOA: 07/05/2020 PCP: Patient, No Pcp Per (Inactive)   Brief Narrative:   Troy [name] is a 80 y.o. male with medical history of NICM with EF 25-30%; CKD stage III, COPD, proximal atrial fibrillation, chronic respiratory failure on 3 L, impaired glucose tolerance, coronary artery disease, hyperlipidemia presenting with 2 to 3-day history of shortness of breath and worsening lower extremity edema.  He has been admitted with acute on chronic systolic congestive heart failure exacerbation and is undergoing diuresis.  He is COVID-19 test is noted to be positive and he is on isolation for this, but does not appear to require any treatment.  He is on his chronic 3 L nasal cannula oxygen.  Assessment & Plan:   Active Problems:   Biventricular implantable cardioverter-defibrillator in situ   Coronary artery disease involving native coronary artery of native heart without angina pectoris   Paroxysmal atrial fibrillation (HCC)   Controlled type 2 diabetes mellitus without complication, without long-term current use of insulin (HCC)   Mixed hyperlipidemia   Chronic respiratory failure with hypoxia (HCC)   Acute on chronic systolic CHF (congestive heart failure) (HCC)   Secondary hypercoagulable state ([place])   Acute on chronic systolic CHF exacerbation -Continue diuresis with Lasix 80 mg IV twice daily and monitor -Appreciate cardiology evaluation and recommendations -2D echocardiogram ordered and pending -Strict I's and O's  COVID-19 infection -Does not appear to have any significant hypoxemia or pneumonia as a result -Procalcitonin and inflammatory markers low  History of paroxysmal atrial fibrillation with supratherapeutic INR -Continue to monitor and hold Coumadin -No active bleeding noted -Continue Coreg for rate control, currently with paced rhythm  CKD stage IIIa-stable -Continue to monitor with aggressive  diuresis  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 Crissie Sickles  Chronic hypoxemic respiratory failure -Normally wears 3 L nasal cannula at home  CAD/elevated troponin/dyslipidemia -No chest pain and EKG with paced rhythm -Appreciate cardiology evaluation -Continue statin  Essential hypertension-stable -Continue carvedilol -Monitor closely with aggressive diuresis  COPD -No acute exacerbation noted -Continue bronchodilators   DVT prophylaxis: Coumadin Code Status: Full Family Communication: None at bedside Disposition Plan:  Status is: Inpatient  Remains inpatient appropriate because:IV treatments appropriate due to intensity of illness or inability to take PO  Dispo: The patient is from: Home              Anticipated d/c is to: Home              Patient currently is not medically stable to d/c.   Difficult to place patient No  Consultants:  Cardiology  Procedures:  See below  Antimicrobials:  None   Subjective: Patient seen and evaluated today with no new acute complaints or concerns. No acute concerns or events noted overnight.  Objective: Vitals:   07/06/20 0251 07/06/20 0500 07/06/20 0529 07/06/20 0939  BP: 138/69  134/69 (!) 144/67  Pulse: 69  69 71  Resp: 16  16   Temp: 97.6 F (36.4 C)  97.7 F (36.5 C)   TempSrc: Oral  Oral   SpO2: 100%  100%   Weight:  78.4 kg    Height:        Intake/Output Summary (Last 24 hours) at 07/06/2020 1222 Last data filed at 07/06/2020 0900 Gross per 24 hour  Intake 843 ml  Output 1300 ml  Net -457 ml   Filed Weights   07/05/20  1035 07/05/20 1615 07/06/20 0500  Weight: 74.8 kg 76.5 kg 78.4 kg    Examination:  General exam: Appears calm and comfortable  Respiratory system: Clear to auscultation. Respiratory effort normal. 3L [place]. Cardiovascular system: S1 & S2 heard, RRR.  Gastrointestinal system: Abdomen is soft Central nervous system: Alert and  awake Extremities: 1-2+ pitting edema bilaterally R>L Skin: No significant lesions noted Psychiatry: Flat affect.    Data Reviewed: I have personally reviewed following labs and imaging studies  CBC: Recent Labs  Lab 07/05/20 1256  WBC 15.0*  HGB 14.6  HCT 46.8  MCV 103.8*  PLT 98*   Basic Metabolic Panel: Recent Labs  Lab 07/05/20 1046 07/06/20 0513  NA 141 143  K 3.6 4.2  CL 93* 96*  CO2 39* 42*  GLUCOSE 150* 112*  BUN 36* 37*  CREATININE 1.25* 1.36*  CALCIUM 9.0 8.7*   GFR: Estimated Creatinine Clearance: 42.6 mL/min (A) (by C-G formula based on SCr of 1.36 mg/dL (H)). Liver Function Tests: Recent Labs  Lab 07/05/20 1046  AST 19  ALT 30  ALKPHOS 78  BILITOT 1.1  PROT 6.2*  ALBUMIN 3.4*   No results for input(s): LIPASE, AMYLASE in the last 168 hours. No results for input(s): AMMONIA in the last 168 hours. Coagulation Profile: Recent Labs  Lab 07/05/20 1117 07/06/20 0513  INR 4.8* 4.9*   Cardiac Enzymes: No results for input(s): CKTOTAL, CKMB, CKMBINDEX, TROPONINI in the last 168 hours. BNP (last 3 results) No results for input(s): PROBNP in the last 8760 hours. HbA1C: No results for input(s): HGBA1C in the last 72 hours. CBG: No results for input(s): GLUCAP in the last 168 hours. Lipid Profile: No results for input(s): CHOL, HDL, LDLCALC, TRIG, CHOLHDL, LDLDIRECT in the last 72 hours. Thyroid Function Tests: No results for input(s): TSH, T4TOTAL, FREET4, T3FREE, THYROIDAB in the last 72 hours. Anemia Panel: Recent Labs    07/05/20 1256  FERRITIN 72   Sepsis Labs: Recent Labs  Lab 07/05/20 1256  PROCALCITON <0.10    Recent Results (from the past 240 hour(s))  Resp Panel by RT-PCR (Flu A&B, Covid) Nasopharyngeal Swab     Status: Abnormal   Collection Time: 07/05/20 11:02 AM   Specimen: Nasopharyngeal Swab; Nasopharyngeal(NP) swabs in vial transport medium  Result Value Ref Range Status   SARS Coronavirus 2 by RT PCR POSITIVE (A)  NEGATIVE Final    Comment: RESULT CALLED TO, READ BACK BY AND VERIFIED WITH: M. CUGINO 07/05/20 @1202  BY S. BEARD (NOTE) SARS-CoV-2 target nucleic acids are DETECTED.  The SARS-CoV-2 RNA is generally detectable in upper respiratory specimens during the acute phase of infection. Positive results are indicative of the presence of the identified virus, but do not rule out bacterial infection or co-infection with other pathogens not detected by the test. Clinical correlation with patient history and other diagnostic information is necessary to determine patient infection status. The expected result is Negative.  Fact Sheet for Patients: EntrepreneurPulse.com.au  Fact Sheet for Healthcare Providers: IncredibleEmployment.be  This test is not yet approved or cleared by the [place] FDA and  has been authorized for detection and/or diagnosis of SARS-CoV-2 by FDA under an Emergency Use Authorization (EUA).  This EUA will remain in effect (meaning this test can  be used) for the duration of  the COVID-19 declaration under Section 564(b)(1) of the Act, 21 U.S.C. section 360bbb-3(b)(1), unless the authorization is terminated or revoked sooner.     Influenza A by PCR NEGATIVE NEGATIVE Final  Influenza B by PCR NEGATIVE NEGATIVE Final    Comment: (NOTE) The Xpert Xpress SARS-CoV-2/FLU/RSV plus assay is intended as an aid in the diagnosis of influenza from Nasopharyngeal swab specimens and should not be used as a sole basis for treatment. Nasal washings and aspirates are unacceptable for Xpert Xpress SARS-CoV-2/FLU/RSV testing.  Fact Sheet for Patients: EntrepreneurPulse.com.au  Fact Sheet for Healthcare Providers: IncredibleEmployment.be  This test is not yet approved or cleared by the [place] FDA and has been authorized for detection and/or diagnosis of SARS-CoV-2 by FDA under an Emergency Use  Authorization (EUA). This EUA will remain in effect (meaning this test can be used) for the duration of the COVID-19 declaration under Section 564(b)(1) of the Act, 21 U.S.C. section 360bbb-3(b)(1), unless the authorization is terminated or revoked.  Performed at [facility], [address], [place], [place] [address]          Radiology Studies: [facility] Chest [facility] 1 View  Result Date: 07/05/2020 CLINICAL DATA:  Shortness of breath and leg swelling EXAM: PORTABLE CHEST 1 VIEW COMPARISON:  06/20/2020 FINDINGS: Cardiac shadow is enlarged but stable. Defibrillator is again noted. Aortic calcifications are seen. Vascular congestion has improved somewhat in the interval from the prior exam. Increasing right-sided pleural effusion is noted with underlying basilar infiltrate. IMPRESSION: Increasing right-sided effusion with underlying right basilar infiltrate. Previously seen vascular congestion has improved in the interval from the prior exam. Electronically Signed   By: Inez Catalina M.D.   On: 07/05/2020 11:23        Scheduled Meds:  alfuzosin  10 mg Oral Daily   allopurinol  100 mg Oral Daily   atorvastatin  10 mg Oral Daily   carvedilol  12.5 mg Oral BID WC   digoxin  62.5 mcg Oral Daily   furosemide  80 mg Intravenous BID   loratadine  10 mg Oral Daily   potassium chloride SA  30 mEq Oral Daily   sodium chloride flush  3 mL Intravenous Q12H   Continuous Infusions:  sodium chloride       LOS: 1 day    Time spent: 35 minutes    Nikolai Wilczak Darleen Crocker, DO [facility]  If 7PM-7AM, please contact night-coverage [email or web address] 07/06/2020, 12:22 PM

## 2020-07-06 NOTE — TOC Initial Note (Signed)
Transition of Care [facility]) - Initial/Assessment Note    Patient Details  Name: Troy [name] MRN: [id number] Date of Birth: December 25, 1940  Transition of Care [facility]) CM/SW Contact:    Boneta Lucks, RN Phone Number: 07/06/2020, 3:09 PM  Clinical Narrative:   Patient admitted with CHF and COVID. Patient has a high risk for readmission. Patient lives alone in an apartment. He has a Development worker, community from 9-3 daily. He uses a walker and has a wheel chair if needed. His Aide provides transportation as needed. TOC consulted for CHF. Patient states he weights himself occasionally.  RN will provide Living Better with CHF book. TOC discussed patient having [employer] aide doing daily weight and assisting him with fill out the chart provided in the book.          Expected Discharge Plan: Home/Self Care Barriers to Discharge: Continued Medical Work up  Patient Goals and CMS Choice Patient states their goals for this hospitalization and ongoing recovery are:: to go home. CMS Medicare.gov Compare Post Acute Care list provided to:: Patient Choice offered to / list presented to : Patient  Expected Discharge Plan and Services Expected Discharge Plan: Home/Self Care     Living arrangements for the past 2 months: Apartment                   Prior Living Arrangements/Services Living arrangements for the past 2 months: Apartment Lives with:: Self Patient language and need for interpreter reviewed:: Yes Do you feel safe going back to the place where you live?: Yes      Need for Family Participation in Patient Care: Yes (Comment) Care giver support system in place?: Yes (comment) Current home services: Homehealth aide Criminal Activity/Legal Involvement Pertinent to Current Situation/Hospitalization: No - Comment as needed  Activities of Daily Living Home Assistive Devices/Equipment: Cane (specify quad or straight) ADL Screening (condition at time of admission) Patient's cognitive ability adequate to safely  complete daily activities?: Yes Is the patient deaf or have difficulty hearing?: No Does the patient have difficulty seeing, even when wearing glasses/contacts?: No Does the patient have difficulty concentrating, remembering, or making decisions?: No Patient able to express need for assistance with ADLs?: Yes Does the patient have difficulty dressing or bathing?: No Independently performs ADLs?: Yes (appropriate for developmental age) Does the patient have difficulty walking or climbing stairs?: Yes Weakness of Legs: Both Weakness of Arms/Hands: Both  Emotional Assessment     Affect (typically observed): Accepting, Pleasant Orientation: : Oriented to Self, Oriented to Place, Oriented to Situation Alcohol / Substance Use: Not Applicable  Admission diagnosis:  Peripheral edema [R60.9] Elevated INR [R79.1] Acute on chronic systolic CHF (congestive heart failure) (HCC) [I50.23] Acute on chronic congestive heart failure, unspecified heart failure type ([place]) [I50.9] COVID-19 [U07.1] Patient Active Problem List   Diagnosis Date Noted   COVID-19    Secondary hypercoagulable state ([place]) 11/05/2019   Chronic respiratory failure with hypoxia ([place]) 01/31/2017   Acute on chronic systolic CHF (congestive heart failure) ([place]) 01/31/2017   Allergic rhinitis 12/05/2016   Hoarseness 11/04/2016   Controlled type 2 diabetes mellitus without complication, without long-term current use of insulin ([place]) 09/19/2016   High risk medications (not anticoagulants) long-term use 09/19/2016   History of ASCVD 09/19/2016   Mixed hyperlipidemia 09/19/2016   Sudden visual loss of left eye 09/19/2016   Lower GI bleed 09/11/2015   Bright red blood per rectum 09/11/2015   BPH (benign prostatic hyperplasia) 12/25/2014   FTT (failure to thrive) in  adult 12/25/2014   Encounter for therapeutic drug monitoring 08/05/2014   Pulmonary nodules 12/23/2013   Hemoptysis    Dyspnea    Acute on chronic congestive heart  failure ([place]) 11/21/2013   Chronic kidney disease, stage 3 (HCC)    Coronary artery disease involving native coronary artery of native heart without angina pectoris    Paroxysmal atrial fibrillation (HCC)    Frequent PVCs    Hypoxia    COPD (chronic obstructive pulmonary disease) ([place]) 06/16/2013   Lipoma of neck 09/11/2011   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   Chronic atrial fibrillation ([place]) 73/41/9379   Chronic systolic CHF (congestive heart failure) ([place])    Cardiomyopathy, primary ([place])    Diverticulosis of colon with hemorrhage 08/28/2010   PCP:  Patient, No Pcp Per (Inactive) Pharmacy:   [place], [place] - [address] [place] [address] Phone: [phone] Fax: [phone]  [id number] - [place], [place] [address] [place] [address] Phone: [phone] Fax: [phone]     Social Determinants of Health (SDOH) Interventions    Readmission Risk Interventions Readmission Risk Prevention Plan 07/06/2020  Medication Screening Complete  Transportation Screening Complete  Some recent data might be hidden

## 2020-07-06 NOTE — Consult Note (Addendum)
Cardiology Consultation:   Patient ID: Troy [name] MRN: [id number]; DOB: 05-Aug-1940  Admit date: 07/05/2020 Date of Consult: 07/06/2020  PCP:  Patient, No Pcp Per (Inactive)   [facility] Providers Cardiologist:  Larae Grooms, MD  Electrophysiologist:  Cristopher Peru, MD  {    Consult requested by: Rodena Goldmann, DO  History of Present Illness:   Troy [name] is an 80 yo male with a hx of HFrEF (s/p BiV-ICD in 2012, generator change 2020), NICM (EF 25-30%), PAF, chronic respiratory failure (on 3L O2) / COPD, CKD stage III, CAD (PCI of RCA 1996), prior lower GI bleed, GERD, gout, arthritis, and HL, who was admitted to the hospital with worsening dyspnea. He describes increasing lower extremity swelling and exertional SOB over the past few days. No chest pain. He recently tested positive for COVID 19 a few weeks ago. He was also admitted to [facility] from 06/20/2020 to 06/21/2020 for heart failure.   In the ED vital signs were: BP 158/79 mmHg, HR 68 bpm, O2 sat 100% on 3L [place]. Hs-Trops were 91 and 88. BNP 3380. Other labs were: K 4.2, gluc 112, BUN 37, Cr 1.36, INR 4.9, D-Dimer 0.31, Digoxin level 0.9, SARS-CoV-2 RNA PCR positive, WBC 15, Hgb 14.6, and Plt 98.. CXR showed increasing right-sided effusion with underlying right basilar infiltrate. ECG documented AV paced rhythm, rate 70 bpm.  Pertinent testing: Echo 10/2019: mod dil LA, mod dil LV, EF 30-35%, mild AR,  Lexiscan Myoview 02/03/2017: no inducible ischemia, evidence of scar in the inferior distribution    Past Medical History:  Diagnosis Date   Atrial fibrillation ([place])    CAD (coronary artery disease) 1996   status post PCI of the RCA    Community acquired pneumonia 08/24/2015   Congestive heart failure, unspecified    COPD (chronic obstructive pulmonary disease) ([place])    Pt on home O2 at night and PRN, unsure of date of diagnosis.   Diabetes mellitus, type 2 (HCC)    DJD (degenerative joint disease)    GERD  (gastroesophageal reflux disease)    Gout    HTN (hypertension)    Ischemic dilated cardiomyopathy (HCC)    Nephrolithiasis    Other primary cardiomyopathies    Personal history of DVT (deep vein thrombosis)    Prostatitis    Shingles     Past Surgical History:  Procedure Laterality Date   BACK SURGERY     BIV ICD GENERATOR CHANGEOUT N/A 12/14/2018   Procedure: BIV ICD GENERATOR CHANGEOUT;  Surgeon: Evans Lance, MD;  Location: [facility];  Service: Cardiovascular;  Laterality: N/A;   CARDIAC DEFIBRILLATOR PLACEMENT     CORONARY STENT PLACEMENT     PACEMAKER INSERTION       Home Medications:  Prior to Admission medications   Medication Sig Start Date End Date Taking? Authorizing Provider  carvedilol (COREG) 12.5 MG tablet TAKE ONE TABLET BY MOUTH TWICE DAILY. TAKE WITH A MEAL. 06/05/20   Jettie Booze, MD  acetaminophen (TYLENOL) 500 MG tablet Take 1,000 mg by mouth every 6 (six) hours as needed for moderate pain.     [provider]  albuterol (ACCUNEB) 1.25 MG/3ML nebulizer solution INHALE CONTENTS OF 1 VIAL IN NEBULIZER EVERY 6 HOURS AS NEEDED. 10/29/19   Collene Gobble, MD  albuterol (VENTOLIN HFA) 108 (90 Base) MCG/ACT inhaler INHALE 1-2 PUFFS INTO THE LUNGS EVERY 6 HOURS AS NEEDED FOR WHEEZING OR SHORTNESS OF BREATH 01/11/20   Collene Gobble,  MD  alfuzosin (UROXATRAL) 10 MG 24 hr tablet Take 10 mg by mouth daily. 02/23/19   [provider]  allopurinol (ZYLOPRIM) 100 MG tablet Take 1 tablet (100 mg total) daily by mouth. 11/29/16   Caren Macadam, MD  atorvastatin (LIPITOR) 10 MG tablet TAKE ONE TABLET BY MOUTH DAILY 07/16/19   Jettie Booze, MD  benzonatate (TESSALON) 100 MG capsule Take 1 capsule (100 mg total) by mouth every 6 (six) hours as needed for cough. 06/15/20   Collene Gobble, MD  cetirizine (ZYRTEC) 10 MG tablet Take 10 mg by mouth daily.    [provider]  digoxin (LANOXIN) 0.125 MG tablet TAKE 1/2 TABLET BY MOUTH  DAILY 05/11/20   Jettie Booze, MD  doxycycline (VIBRA-TABS) 100 MG tablet Take 1 tablet (100 mg total) by mouth 2 (two) times daily. 06/30/20   Collene Gobble, MD  fluticasone (FLONASE) 50 MCG/ACT nasal spray Place 2 sprays into both nostrils at bedtime as needed for allergies. 10/22/16   Caren Macadam, MD  Fluticasone-Umeclidin-Vilant (TRELEGY ELLIPTA) 100-62.5-25 MCG/INH AEPB INHALE ONE PUFF INTO THE LUNGS DAILY 05/11/20   Collene Gobble, MD  furosemide (LASIX) 40 MG tablet Take 2 tablets (80 mg total) by mouth 2 (two) times daily. 05/17/20   Jettie Booze, MD  potassium chloride SA (KLOR-CON) 20 MEQ tablet Take 1.5 tablets (30 mEq total) by mouth daily. 12/25/18   Evans Lance, MD  predniSONE (STERAPRED UNI-PAK 21 TAB) 10 MG (21) TBPK tablet Take by mouth daily. Take 40mg  daily for 3 days, then 30mg  daily for 3 days, then 20mg  daily for 3 days, then 10mg  daily for 3 days, then stop 06/30/20   Collene Gobble, MD  triamcinolone cream (KENALOG) 0.1 % Apply topically 2 (two) times daily. to affected area 06/07/19   [provider]  warfarin (COUMADIN) 1 MG tablet TAKE TWO (2) TABLETS BY MOUTH EVERY DAY OR USE AS DIRECTED 06/29/20   Jettie Booze, MD    Inpatient Medications: Scheduled Meds:  alfuzosin  10 mg Oral Daily   allopurinol  100 mg Oral Daily   atorvastatin  10 mg Oral Daily   carvedilol  12.5 mg Oral BID WC   digoxin  62.5 mcg Oral Daily   furosemide  80 mg Intravenous BID   loratadine  10 mg Oral Daily   potassium chloride SA  30 mEq Oral Daily   sodium chloride flush  3 mL Intravenous Q12H   Continuous Infusions:  sodium chloride     PRN Meds: sodium chloride, acetaminophen, fluticasone, ondansetron (ZOFRAN) IV, ondansetron (ZOFRAN) IV, sodium chloride flush  Allergies:    Allergies  Allergen Reactions   Benadryl [Diphenhydramine Hcl] Nausea And Vomiting    Social History:   Social History   Socioeconomic History   Marital status: Single     Spouse name: Not on file   Number of children: 0   Years of education: Not on file   Highest education level: Not on file  Occupational History   Occupation: Retired from [employer]  Tobacco Use   Smoking status: Former    Packs/day: 4.00    Years: 50.00    Pack years: 200.00    Types: Cigarettes    Quit date: 01/22/1980    Years since quitting: 40.4   Smokeless tobacco: Never  Vaping Use   Vaping Use: Never used  Substance and Sexual Activity   Alcohol use: No    Alcohol/week: 0.0 standard drinks  Comment: denies   Drug use: No   Sexual activity: Never  Other Topics Concern   Not on file  Social History Narrative   Lives alone.  Single.  No children.  Ambulates with a cane.  Has a deceased brother and is estranged from sisters.   Social Determinants of Health   Financial Resource Strain: Not on file  Food Insecurity: Not on file  Transportation Needs: Not on file  Physical Activity: Not on file  Stress: Not on file  Social Connections: Not on file  Intimate Partner Violence: Not on file    Family History:    Family History  Problem Relation Age of Onset   Heart disease Father    Cancer Father        LUNG   Colon cancer Mother    Stroke Paternal Uncle    Heart attack Neg Hx    Hyperlipidemia Neg Hx    Hypertension Neg Hx      ROS:  Please see the history of present illness.  All other ROS reviewed and negative.     Physical Exam/Data:   Vitals:   07/05/20 2117 07/06/20 0251 07/06/20 0500 07/06/20 0529  BP: (!) 146/69 138/69  134/69  Pulse: 70 69  69  Resp: 16 16  16   Temp: 97.6 F (36.4 C) 97.6 F (36.4 C)  97.7 F (36.5 C)  TempSrc: Oral Oral  Oral  SpO2: 100% 100%  100%  Weight:   78.4 kg   Height:        Intake/Output Summary (Last 24 hours) at 07/06/2020 0847 Last data filed at 07/06/2020 0500 Gross per 24 hour  Intake 603 ml  Output 1300 ml  Net -697 ml   Last 3 Weights 07/06/2020 07/05/2020 07/05/2020  Weight (lbs) 172 lb  13.5 oz 168 lb 10.4 oz 165 lb  Weight (kg) 78.4 kg 76.5 kg 74.844 kg     Body mass index is 27.9 kg/m.  General:  Well nourished, well developed, in no acute distress HEENT: normal Lymph: no adenopathy Neck: elevated JVP Endocrine:  No thryomegaly Vascular: No carotid bruits; FA pulses 2+ bilaterally without bruits  Cardiac:  normal S1, S2; RRR; no murmur  Lungs:  decreased BS at bases  Abd: soft, nontender, no hepatomegaly  Ext: 2+ LE edema Musculoskeletal:  No deformities, BUE and BLE strength normal and equal Skin: warm and dry  Neuro:  CNs 2-12 intact, no focal abnormalities noted Psych:  Normal affect   EKG:  The EKG was personally reviewed and demonstrates:  AV paced rhythm, rate 70bpm  Telemetry:  Telemetry was personally reviewed and demonstrates:  Paced rhythm    Laboratory Data:  High Sensitivity Troponin:   Recent Labs  Lab 07/05/20 1046 07/05/20 1256  TROPONINIHS 91* 88*     Chemistry Recent Labs  Lab 07/05/20 1046 07/06/20 0513  NA 141 143  K 3.6 4.2  CL 93* 96*  CO2 39* 42*  GLUCOSE 150* 112*  BUN 36* 37*  CREATININE 1.25* 1.36*  CALCIUM 9.0 8.7*  GFRNONAA 58* 53*  ANIONGAP 9 5    Recent Labs  Lab 07/05/20 1046  PROT 6.2*  ALBUMIN 3.4*  AST 19  ALT 30  ALKPHOS 78  BILITOT 1.1   Hematology Recent Labs  Lab 07/05/20 1256  WBC 15.0*  RBC 4.51  HGB 14.6  HCT 46.8  MCV 103.8*  MCH 32.4  MCHC 31.2  RDW 17.0*  PLT 98*   BNP Recent Labs  Lab  07/05/20 1046  BNP 3,380.0*    DDimer  Recent Labs  Lab 07/05/20 1256  DDIMER 0.31     Radiology/Studies:  DG Chest Port 1 View  Result Date: 07/05/2020 CLINICAL DATA:  Shortness of breath and leg swelling EXAM: PORTABLE CHEST 1 VIEW COMPARISON:  06/20/2020 FINDINGS: Cardiac shadow is enlarged but stable. Defibrillator is again noted. Aortic calcifications are seen. Vascular congestion has improved somewhat in the interval from the prior exam. Increasing right-sided pleural effusion  is noted with underlying basilar infiltrate. IMPRESSION: Increasing right-sided effusion with underlying right basilar infiltrate. Previously seen vascular congestion has improved in the interval from the prior exam. Electronically Signed   By: Inez Catalina M.D.   On: 07/05/2020 11:23     Assessment and Plan:    HFrEF (acute on chronic systolic heart failure, NYHA Class III-IV symptoms)  -Agree with furosemide IV bolus dosing (currently on 80 mg IV q12 hours) -Strict I and Os -Daily weights -Monitor lytes: maintain K>4.0 and Mg>2.0 -Reasonable to repeat echo on this admission -Not on ARNI/ACEi/ARBs/SGLT2 inhibitors likely due to renal issues -If BP allows, consider low dose hydralazine/nitrates combo for afterload reduction   Paroxysmal Atrial fibrillation ECG showed AV paced rhythm. CHA2DS2-VASc score is 6, indicating a 9.7% annual risk of stroke. Is on coumadin for chronic anticoagulation. However INR is elevated on this admission 4.9.  -Hold coumadin till INR drifts below 3.0 and then restart -Continue carvedilol -Continue digoxin  Patient has seen Dr. Lovena Le (from EP) in the past. The patient is not considered for anti-arrhythmic therapy due to oxygen dependent COPD and chronic renal insufficiency making both amiodarone and dofetilide poor choices in him.  3. History of CAD Hs-Trops are flat. Presentation is not consistent with ACS. Patient not on ASA since he takes coumadin and has had GI bleeding in the past.  -Continue atorvastatin  4. Recent COVID-19 pneumonia  Not an active issue currently. Will defer to primary team for management of condition.   For questions or updates, please contact [place] Please consult [email or web address] for contact info under    Signed, Meade Maw, MD  07/06/2020 8:47 AM

## 2020-07-07 ENCOUNTER — Inpatient Hospital Stay (HOSPITAL_COMMUNITY): Payer: Medicare Other

## 2020-07-07 DIAGNOSIS — I5023 Acute on chronic systolic (congestive) heart failure: Secondary | ICD-10-CM

## 2020-07-07 DIAGNOSIS — I5043 Acute on chronic combined systolic (congestive) and diastolic (congestive) heart failure: Secondary | ICD-10-CM

## 2020-07-07 LAB — MAGNESIUM: Magnesium: 2.1 mg/dL (ref 1.7–2.4)

## 2020-07-07 LAB — BASIC METABOLIC PANEL
Anion gap: 6 (ref 5–15)
BUN: 34 mg/dL — ABNORMAL HIGH (ref 8–23)
CO2: 43 mmol/L — ABNORMAL HIGH (ref 22–32)
Calcium: 8.5 mg/dL — ABNORMAL LOW (ref 8.9–10.3)
Chloride: 93 mmol/L — ABNORMAL LOW (ref 98–111)
Creatinine, Ser: 1.36 mg/dL — ABNORMAL HIGH (ref 0.61–1.24)
GFR, Estimated: 53 mL/min — ABNORMAL LOW (ref >60)
Glucose, Bld: 148 mg/dL — ABNORMAL HIGH (ref 70–99)
Potassium: 4.1 mmol/L (ref 3.5–5.1)
Sodium: 142 mmol/L (ref 135–145)

## 2020-07-07 LAB — ECHOCARDIOGRAM COMPLETE
AR max vel: 3.97 cm2
AV Area VTI: 3.68 cm2
AV Area mean vel: 3.86 cm2
AV Mean grad: 4.5 mmHg
AV Peak grad: 7.9 mmHg
Ao pk vel: 1.4 m/s
Area-P 1/2: 6.02 cm2
Height: 66 in
P 1/2 time: 574 msec
S' Lateral: 4.93 cm
Single Plane A4C EF: 31.6 %
Weight: 2871.27 oz

## 2020-07-07 LAB — CBC
HCT: 44.6 % (ref 39.0–52.0)
Hemoglobin: 13.7 g/dL (ref 13.0–17.0)
MCH: 32 pg (ref 26.0–34.0)
MCHC: 30.7 g/dL (ref 30.0–36.0)
MCV: 104.2 fL — ABNORMAL HIGH (ref 80.0–100.0)
Platelets: 92 10*3/uL — ABNORMAL LOW (ref 150–400)
RBC: 4.28 MIL/uL (ref 4.22–5.81)
RDW: 16.5 % — ABNORMAL HIGH (ref 11.5–15.5)
WBC: 11.6 10*3/uL — ABNORMAL HIGH (ref 4.0–10.5)
nRBC: 0 % (ref 0.0–0.2)

## 2020-07-07 LAB — PROTIME-INR
INR: 5 (ref 0.8–1.2)
Prothrombin Time: 46.2 seconds — ABNORMAL HIGH (ref 11.4–15.2)

## 2020-07-07 LAB — GLUCOSE, CAPILLARY: Glucose-Capillary: 199 mg/dL — ABNORMAL HIGH (ref 70–99)

## 2020-07-07 MED ORDER — GUAIFENESIN-DM 100-10 MG/5ML PO SYRP
15.0000 mL | ORAL_SOLUTION | ORAL | Status: DC | PRN
  Administered 2020-07-07: 15 mL via ORAL
  Filled 2020-07-07: qty 15

## 2020-07-07 MED ORDER — FUROSEMIDE 10 MG/ML IJ SOLN
80.0000 mg | Freq: Three times a day (TID) | INTRAMUSCULAR | Status: DC
  Administered 2020-07-07 – 2020-07-08 (×3): 80 mg via INTRAVENOUS
  Filled 2020-07-07 (×3): qty 8

## 2020-07-07 NOTE — Progress Notes (Signed)
Patients O2 dropped to 84% at 3 liters. Increased O2 to 5 liters via nasal cannula. Patients O2 stat 98% at 5 liters. Noted patient having episodes of coughing, non-productive. MD Manuella Ghazi aware.

## 2020-07-07 NOTE — Progress Notes (Addendum)
Progress Note  Patient Name: Troy [name] Date of Encounter: 07/07/2020  [facility] Cardiologist: Larae Grooms, MD   Subjective  Mild improvement in SOB  Inpatient Medications    Scheduled Meds:  alfuzosin  10 mg Oral Daily   allopurinol  100 mg Oral Daily   atorvastatin  10 mg Oral Daily   carvedilol  12.5 mg Oral BID WC   digoxin  62.5 mcg Oral Daily   furosemide  80 mg Intravenous BID   loratadine  10 mg Oral Daily   potassium chloride SA  30 mEq Oral Daily   sodium chloride flush  3 mL Intravenous Q12H   Continuous Infusions:  sodium chloride     PRN Meds: sodium chloride, acetaminophen, fluticasone, ondansetron (ZOFRAN) IV, ondansetron (ZOFRAN) IV, sodium chloride flush   Vital Signs    Vitals:   07/06/20 2015 07/07/20 0235 07/07/20 0531 07/07/20 0856  BP: (!) 138/59 (!) 155/69 (!) 142/69 (!) 150/64  Pulse: 69 70 70 70  Resp: 20  20   Temp: 97.8 F (36.6 C)  97.7 F (36.5 C)   TempSrc: Oral  Oral   SpO2: 100% 100% 100%   Weight:   81.4 kg   Height:        Intake/Output Summary (Last 24 hours) at 07/07/2020 0928 Last data filed at 07/07/2020 0500 Gross per 24 hour  Intake 360 ml  Output 1950 ml  Net -1590 ml   Last 3 Weights 07/07/2020 07/06/2020 07/05/2020  Weight (lbs) 179 lb 7.3 oz 172 lb 13.5 oz 168 lb 10.4 oz  Weight (kg) 81.4 kg 78.4 kg 76.5 kg      Telemetry    AV pacing- Personally Reviewed  ECG    N/a - Personally Reviewed  Physical Exam   GEN: No acute distress.   Neck: No JVD Cardiac: RRR, no murmurs, rubs, or gallops.  Respiratory: Clear to auscultation bilaterally. GI: Soft, nontender, non-distended  MS: 1+ bialteralL edema No deformity. Neuro:  Nonfocal  Psych: Normal affect   Labs    High Sensitivity Troponin:   Recent Labs  Lab 07/05/20 1046 07/05/20 1256  TROPONINIHS 91* 88*      Chemistry Recent Labs  Lab 07/05/20 1046 07/06/20 0513 07/07/20 0627  NA 141 143 142  K 3.6 4.2 4.1  CL 93* 96*  93*  CO2 39* 42* 43*  GLUCOSE 150* 112* 148*  BUN 36* 37* 34*  CREATININE 1.25* 1.36* 1.36*  CALCIUM 9.0 8.7* 8.5*  PROT 6.2*  --   --   ALBUMIN 3.4*  --   --   AST 19  --   --   ALT 30  --   --   ALKPHOS 78  --   --   BILITOT 1.1  --   --   GFRNONAA 58* 53* 53*  ANIONGAP 9 5 6      Hematology Recent Labs  Lab 07/05/20 1256 07/07/20 0627  WBC 15.0* 11.6*  RBC 4.51 4.28  HGB 14.6 13.7  HCT 46.8 44.6  MCV 103.8* 104.2*  MCH 32.4 32.0  MCHC 31.2 30.7  RDW 17.0* 16.5*  PLT 98* 92*    BNP Recent Labs  Lab 07/05/20 1046  BNP 3,380.0*     DDimer  Recent Labs  Lab 07/05/20 1256  DDIMER 0.31     Radiology    DG Chest Port 1 View  Result Date: 07/05/2020 CLINICAL DATA:  Shortness of breath and leg swelling EXAM: PORTABLE CHEST 1 VIEW COMPARISON:  06/20/2020 FINDINGS: Cardiac shadow is enlarged but stable. Defibrillator is again noted. Aortic calcifications are seen. Vascular congestion has improved somewhat in the interval from the prior exam. Increasing right-sided pleural effusion is noted with underlying basilar infiltrate. IMPRESSION: Increasing right-sided effusion with underlying right basilar infiltrate. Previously seen vascular congestion has improved in the interval from the prior exam. Electronically Signed   By: Inez Catalina M.D.   On: 07/05/2020 11:23    Cardiac Studies    Patient Profile     [name] is an 80 yo male with a hx of HFrEF (s/p BiV-ICD in 2012, generator change 2020), NICM (EF 25-30%), PAF, chronic respiratory failure (on 3L O2) / COPD, CKD stage III, CAD (PCI of RCA 1996), prior lower GI bleed, GERD, gout, arthritis, and HL, who was admitted to the hospital with worsening dyspnea. He describes increasing lower extremity swelling and exertional SOB over the past few days. No chest pain. He recently tested positive for COVID 19 a few weeks ago. He was also admitted to [facility] from 06/20/2020 to 06/21/2020 for heart failure.  Assessment & Plan     Acute on chronic systolic HF - Jan 4098 echo: LVEF 25-30%, grade I dd - repeat echo pending - presented with SOB, LE edema. BNP 3380, CXR vascular congestion, right pleural effusion  - he is on IV lasix 80mg  bid, neg 1.3 L yesterday, neg 2 L sicne admission. Weights appear inaccurate. Mild uptrend in Cr though within prior ranges - on coreg 12.5mg  bid. Assume has not been on ACE/ARB/ARNI/aldactone as home regimen due to labile renal function.  - pending bp's with diuresis may consider hydral/nitrates  -remains fluid overloaded, increase lasix to 80mg  iv tid today.   2. PAF - has been on coreg and digoxin, coumadin for anticoag - from notes has seen EP, not elgible for antiarrhythmics given HF, renal disease, lung disease - supratherapeutic INR on admission, followed by pharmacy  3. History of CAD - no acute issues  4. COVID + - from admit note over 2 weeks out from testing +  5. COPD - chronically on home O2 3 L [place]    For questions or updates, please contact [place] Please consult [email or web address] for contact info under        Signed, Carlyle Dolly, MD  07/07/2020, 9:28 AM

## 2020-07-07 NOTE — Progress Notes (Signed)
ANTICOAGULATION CONSULT NOTE - Pharmacy Consult for warfarin Indication: atrial fibrillation  Allergies  Allergen Reactions   Benadryl [Diphenhydramine Hcl] Nausea And Vomiting    Patient Measurements: Height: 5\' 6"  (167.6 cm) Weight: 81.4 kg (179 lb 7.3 oz) IBW/kg (Calculated) : 63.8  Vital Signs: Temp: 97.7 F (36.5 C) (06/17 0531) Temp Source: Oral (06/17 0531) BP: 150/64 (06/17 0856) Pulse Rate: 70 (06/17 0856)  Labs: Recent Labs    07/05/20 1046 07/05/20 1117 07/05/20 1256 07/06/20 0513 07/07/20 0627  HGB  --   --  14.6  --  13.7  HCT  --   --  46.8  --  44.6  PLT  --   --  98*  --  92*  LABPROT  --  45.1*  --  45.4* 46.2*  INR  --  4.8*  --  4.9* 5.0*  CREATININE 1.25*  --   --  1.36* 1.36*  TROPONINIHS 91*  --  88*  --   --      Estimated Creatinine Clearance: 43.4 mL/min (A) (by C-G formula based on SCr of 1.36 mg/dL (H)).   Medical History: Past Medical History:  Diagnosis Date   Atrial fibrillation ([place])    CAD (coronary artery disease) 1996   status post PCI of the RCA    Community acquired pneumonia 08/24/2015   Congestive heart failure, unspecified    COPD (chronic obstructive pulmonary disease) (H[facility])    Pt on home O2 at night and PRN, unsure of date of diagnosis.   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HCC)    Nephrolithiasis    Other primary cardiomyopathies    Personal history of DVT (deep vein thrombosis)    Prostatitis    Shingles     Medications:  Medications Prior to Admission  Medication Sig Dispense Refill Last Dose   acetaminophen (TYLENOL) 500 MG tablet Take 1,000 mg by mouth every 6 (six) hours as needed for moderate pain.    PRN   albuterol (ACCUNEB) 1.25 MG/3ML nebulizer solution INHALE CONTENTS OF 1 VIAL IN NEBULIZER EVERY 6 HOURS AS NEEDED. (Patient taking differently: Take 1 ampule by  nebulization every 6 (six) hours as needed for wheezing or shortness of breath.) 360 mL 5 Past Week   albuterol (VENTOLIN HFA) 108 (90 Base) MCG/ACT inhaler INHALE 1-2 PUFFS INTO THE LUNGS EVERY 6 HOURS AS NEEDED FOR WHEEZING OR SHORTNESS OF BREATH (Patient taking differently: Inhale 1-2 puffs into the lungs every 6 (six) hours as needed for shortness of breath or wheezing.) 8.5 g 5 Past Week   alfuzosin (UROXATRAL) 10 MG 24 hr tablet Take 10 mg by mouth daily.   07/05/2020   allopurinol (ZYLOPRIM) 100 MG tablet Take 1 tablet (100 mg total) daily by mouth. 90 tablet 0 07/05/2020   atorvastatin (LIPITOR) 10 MG tablet TAKE ONE TABLET BY MOUTH DAILY (Patient taking differently: Take 10 mg by mouth daily.) 30 tablet 11 07/04/2020   benzonatate (TESSALON) 100 MG capsule Take 1 capsule (100 mg total) by mouth every 6 (six) hours as needed for cough. 30 capsule 1 PRN   carvedilol (COREG) 12.5 MG tablet TAKE ONE TABLET BY MOUTH TWICE DAILY. TAKE WITH A MEAL. (Patient taking differently: Take 12.5 mg by mouth 2 (two) times daily with a meal.) 180 tablet 1 07/05/2020 at am   cetirizine (ZYRTEC)  10 MG tablet Take 10 mg by mouth daily.   07/05/2020   Cholecalciferol 25 MCG (1000 UT) tablet Take 1 tablet by mouth daily.   07/05/2020   digoxin (LANOXIN) 0.125 MG tablet TAKE 1/2 TABLET BY MOUTH DAILY (Patient taking differently: Take 0.0625 mg by mouth daily.) 45 tablet 2 07/05/2020   fluticasone (FLONASE) 50 MCG/ACT nasal spray Place 2 sprays into both nostrils at bedtime as needed for allergies. 16 g 3 Past Week   Fluticasone-Umeclidin-Vilant (TRELEGY ELLIPTA) 100-62.5-25 MCG/INH AEPB INHALE ONE PUFF INTO THE LUNGS DAILY (Patient taking differently: Inhale 1 puff into the lungs daily.) 60 each 1 07/05/2020   furosemide (LASIX) 40 MG tablet Take 2 tablets (80 mg total) by mouth 2 (two) times daily. 120 tablet 3 07/05/2020   potassium chloride SA (KLOR-CON) 20 MEQ tablet Take 1.5 tablets (30 mEq total) by mouth daily. 135  tablet 3 07/05/2020   vitamin C (ASCORBIC ACID) 500 MG tablet Take 1,000 mg by mouth every morning.   07/05/2020   warfarin (COUMADIN) 1 MG tablet TAKE TWO (2) TABLETS BY MOUTH EVERY DAY OR USE AS DIRECTED (Patient taking differently: Take 2 mg by mouth daily.) 65 tablet 3 07/05/2020 at am   zinc gluconate 50 MG tablet Take 50 mg by mouth daily.   07/05/2020   doxycycline (VIBRA-TABS) 100 MG tablet Take 1 tablet (100 mg total) by mouth 2 (two) times daily. (Patient not taking: No sig reported) 14 tablet 0 Completed Course   predniSONE (STERAPRED UNI-PAK 21 TAB) 10 MG (21) TBPK tablet Take by mouth daily. Take 40mg  daily for 3 days, then 30mg  daily for 3 days, then 20mg  daily for 3 days, then 10mg  daily for 3 days, then stop (Patient not taking: No sig reported) 30 tablet 0 Completed Course    Assessment: Pharmacy consulted to dose warfarin in patient with atrial fibrillation.  INR on admission is supratherapeutic at 4.8.  Home dose listed as 1 mg on Wed and 2 mg ROW.  INR 5, supratherapeutic  Goal of Therapy:  INR 2-3 Monitor platelets by anticoagulation protocol: Yes   Plan:  Hold warfarin today Monitor daily INR and s/s of bleeding.  Isac Sarna, BS [place], [place] Clinical Pharmacist Pager [phone] 07/07/2020 11:36 AM

## 2020-07-07 NOTE — Progress Notes (Signed)
PROGRESS NOTE    Troy [name]  CBS:[id number] DOB: [birth year] DOA: 07/05/2020 PCP: Patient, No Pcp Per (Inactive)   Brief Narrative:   Troy [name] is a 80 y.o. male with medical history of NICM with EF 25-30%; CKD stage III, COPD, proximal atrial fibrillation, chronic respiratory failure on 3 L, impaired glucose tolerance, coronary artery disease, hyperlipidemia presenting with 2 to 3-day history of shortness of breath and worsening lower extremity edema.  He has been admitted with acute on chronic systolic congestive heart failure exacerbation and is undergoing diuresis.  He is COVID-19 test is noted to be positive and he is on isolation for this, but does not appear to require any treatment.  He is on his chronic 3 L nasal cannula oxygen.  Assessment & Plan:   Active Problems:   Biventricular implantable cardioverter-defibrillator in situ   Acute on chronic congestive heart failure (HCC)   Coronary artery disease involving native coronary artery of native heart without angina pectoris   Paroxysmal atrial fibrillation (HCC)   Controlled type 2 diabetes mellitus without complication, without long-term current use of insulin (HCC)   Mixed hyperlipidemia   Chronic respiratory failure with hypoxia (HCC)   Acute on chronic systolic CHF (congestive heart failure) (HCC)   Secondary hypercoagulable state ([place])   Acute on chronic systolic CHF exacerbation -Continue diuresis with Lasix 80 mg IV, now increased to 3 times daily dosing -Appreciate cardiology evaluation and recommendations -2D echocardiogram ordered and pending -Strict I's and O's   COVID-19 infection -Does not appear to have any significant hypoxemia or pneumonia as a result -Procalcitonin and inflammatory markers low   History of paroxysmal atrial fibrillation with supratherapeutic INR -Continue to monitor and hold Coumadin -No active bleeding noted -Continue Coreg for rate control, currently with paced  rhythm   CKD stage IIIa-stable -Continue to monitor with aggressive diuresis   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 Crissie Sickles   Chronic hypoxemic respiratory failure -Normally wears 3 L nasal cannula at home   CAD/elevated troponin/dyslipidemia -No chest pain and EKG with paced rhythm -Appreciate cardiology evaluation -Continue statin   Essential hypertension-stable -Continue carvedilol -Monitor closely with aggressive diuresis   COPD -No acute exacerbation noted -Continue bronchodilators     DVT prophylaxis: Coumadin Code Status: Full Family Communication: Niece on phone 6/17 Disposition Plan:  Status is: Inpatient   Remains inpatient appropriate because:IV treatments appropriate due to intensity of illness or inability to take PO   Dispo: The patient is from: Home              Anticipated d/c is to: Home              Patient currently is not medically stable to d/c.              Difficult to place patient No   Consultants:  Cardiology   Procedures:  See below   Antimicrobials:  None   Subjective: Patient seen and evaluated today with no new acute complaints or concerns. No acute concerns or events noted overnight.  Objective: Vitals:   07/06/20 2015 07/07/20 0235 07/07/20 0531 07/07/20 0856  BP: (!) 138/59 (!) 155/69 (!) 142/69 (!) 150/64  Pulse: 69 70 70 70  Resp: 20  20   Temp: 97.8 F (36.6 C)  97.7 F (36.5 C)   TempSrc: Oral  Oral   SpO2: 100% 100% 100%   Weight:   81.4 kg   Height:  Intake/Output Summary (Last 24 hours) at 07/07/2020 1209 Last data filed at 07/07/2020 0900 Gross per 24 hour  Intake 600 ml  Output 1950 ml  Net -1350 ml   Filed Weights   07/05/20 1615 07/06/20 0500 07/07/20 0531  Weight: 76.5 kg 78.4 kg 81.4 kg    Examination:  General exam: Appears calm and comfortable  Respiratory system: Clear to auscultation. Respiratory effort normal. On 3L  [place]. Cardiovascular system: S1 & S2 heard, RRR.  Gastrointestinal system: Abdomen is soft Central nervous system: Alert and awake Extremities: 1+ pitting edema bilaterally Skin: No significant lesions noted Psychiatry: Flat affect.    Data Reviewed: I have personally reviewed following labs and imaging studies  CBC: Recent Labs  Lab 07/05/20 1256 07/07/20 0627  WBC 15.0* 11.6*  HGB 14.6 13.7  HCT 46.8 44.6  MCV 103.8* 104.2*  PLT 98* 92*   Basic Metabolic Panel: Recent Labs  Lab 07/05/20 1046 07/06/20 0513 07/07/20 0627  NA 141 143 142  K 3.6 4.2 4.1  CL 93* 96* 93*  CO2 39* 42* 43*  GLUCOSE 150* 112* 148*  BUN 36* 37* 34*  CREATININE 1.25* 1.36* 1.36*  CALCIUM 9.0 8.7* 8.5*  MG  --   --  2.1   GFR: Estimated Creatinine Clearance: 43.4 mL/min (A) (by C-G formula based on SCr of 1.36 mg/dL (H)). Liver Function Tests: Recent Labs  Lab 07/05/20 1046  AST 19  ALT 30  ALKPHOS 78  BILITOT 1.1  PROT 6.2*  ALBUMIN 3.4*   No results for input(s): LIPASE, AMYLASE in the last 168 hours. No results for input(s): AMMONIA in the last 168 hours. Coagulation Profile: Recent Labs  Lab 07/05/20 1117 07/06/20 0513 07/07/20 0627  INR 4.8* 4.9* 5.0*   Cardiac Enzymes: No results for input(s): CKTOTAL, CKMB, CKMBINDEX, TROPONINI in the last 168 hours. BNP (last 3 results) No results for input(s): PROBNP in the last 8760 hours. HbA1C: No results for input(s): HGBA1C in the last 72 hours. CBG: No results for input(s): GLUCAP in the last 168 hours. Lipid Profile: No results for input(s): CHOL, HDL, LDLCALC, TRIG, CHOLHDL, LDLDIRECT in the last 72 hours. Thyroid Function Tests: No results for input(s): TSH, T4TOTAL, FREET4, T3FREE, THYROIDAB in the last 72 hours. Anemia Panel: Recent Labs    07/05/20 1256  FERRITIN 72   Sepsis Labs: Recent Labs  Lab 07/05/20 1256  PROCALCITON <0.10    Recent Results (from the past 240 hour(s))  Resp Panel by RT-PCR (Flu  A&B, Covid) Nasopharyngeal Swab     Status: Abnormal   Collection Time: 07/05/20 11:02 AM   Specimen: Nasopharyngeal Swab; Nasopharyngeal(NP) swabs in vial transport medium  Result Value Ref Range Status   SARS Coronavirus 2 by RT PCR POSITIVE (A) NEGATIVE Final    Comment: RESULT CALLED TO, READ BACK BY AND VERIFIED WITH: M. CUGINO 07/05/20 @1202  BY S. BEARD (NOTE) SARS-CoV-2 target nucleic acids are DETECTED.  The SARS-CoV-2 RNA is generally detectable in upper respiratory specimens during the acute phase of infection. Positive results are indicative of the presence of the identified virus, but do not rule out bacterial infection or co-infection with other pathogens not detected by the test. Clinical correlation with patient history and other diagnostic information is necessary to determine patient infection status. The expected result is Negative.  Fact Sheet for Patients: EntrepreneurPulse.com.au  Fact Sheet for Healthcare Providers: IncredibleEmployment.be  This test is not yet approved or cleared by the [place] and  has been authorized  for detection and/or diagnosis of SARS-CoV-2 by FDA under an Emergency Use Authorization (EUA).  This EUA will remain in effect (meaning this test can  be used) for the duration of  the COVID-19 declaration under Section 564(b)(1) of the Act, 21 U.S.C. section 360bbb-3(b)(1), unless the authorization is terminated or revoked sooner.     Influenza A by PCR NEGATIVE NEGATIVE Final   Influenza B by PCR NEGATIVE NEGATIVE Final    Comment: (NOTE) The Xpert Xpress SARS-CoV-2/FLU/RSV plus assay is intended as an aid in the diagnosis of influenza from Nasopharyngeal swab specimens and should not be used as a sole basis for treatment. Nasal washings and aspirates are unacceptable for Xpert Xpress SARS-CoV-2/FLU/RSV testing.  Fact Sheet for Patients: EntrepreneurPulse.com.au  Fact  Sheet for Healthcare Providers: IncredibleEmployment.be  This test is not yet approved or cleared by the [place] FDA and has been authorized for detection and/or diagnosis of SARS-CoV-2 by FDA under an Emergency Use Authorization (EUA). This EUA will remain in effect (meaning this test can be used) for the duration of the COVID-19 declaration under Section 564(b)(1) of the Act, 21 U.S.C. section 360bbb-3(b)(1), unless the authorization is terminated or revoked.  Performed at [facility], [address] [place], [place] [address]          Radiology Studies: No results found.      Scheduled Meds:  alfuzosin  10 mg Oral Daily   allopurinol  100 mg Oral Daily   atorvastatin  10 mg Oral Daily   carvedilol  12.5 mg Oral BID WC   digoxin  62.5 mcg Oral Daily   furosemide  80 mg Intravenous TID   loratadine  10 mg Oral Daily   potassium chloride SA  30 mEq Oral Daily   sodium chloride flush  3 mL Intravenous Q12H   Continuous Infusions:  sodium chloride       LOS: 2 days    Time spent: 35 minutes    Ayden Apodaca Darleen Crocker, DO [facility]  If 7PM-7AM, please contact night-coverage [email or web address] 07/07/2020, 12:09 PM

## 2020-07-07 NOTE — Progress Notes (Signed)
  Echocardiogram 2D Echocardiogram has been performed.  Matilde Bash 07/07/2020, 2:24 PM

## 2020-07-07 NOTE — Progress Notes (Signed)
0144:pt's HR jumping around from 90s to 130s, pt is asymptomatic states he is not feeling any chest tightness, does not feel like his heart is beating fast.Pt has a pacemaker and is supposed to be V-paced. MD notified.  0300: Pt's HR returned to v-paced in the 31s.

## 2020-07-07 NOTE — Care Management Important Message (Signed)
Important Message  Patient Details  Name: Troy [name] MRN: [id number] Date of Birth: [birth year]   Medicare Important Message Given:  Yes (RN will deliver due to contact precautions)     Tommy Medal 07/07/2020, 1:25 PM

## 2020-07-08 DIAGNOSIS — I5043 Acute on chronic combined systolic (congestive) and diastolic (congestive) heart failure: Secondary | ICD-10-CM | POA: Diagnosis not present

## 2020-07-08 LAB — PROTIME-INR
INR: 4.6 (ref 0.8–1.2)
Prothrombin Time: 43.4 seconds — ABNORMAL HIGH (ref 11.4–15.2)

## 2020-07-08 LAB — BASIC METABOLIC PANEL
Anion gap: 9 (ref 5–15)
BUN: 27 mg/dL — ABNORMAL HIGH (ref 8–23)
CO2: 41 mmol/L — ABNORMAL HIGH (ref 22–32)
Calcium: 8.3 mg/dL — ABNORMAL LOW (ref 8.9–10.3)
Chloride: 93 mmol/L — ABNORMAL LOW (ref 98–111)
Creatinine, Ser: 1 mg/dL (ref 0.61–1.24)
GFR, Estimated: >60 mL/min (ref >60)
Glucose, Bld: 121 mg/dL — ABNORMAL HIGH (ref 70–99)
Potassium: 4.4 mmol/L (ref 3.5–5.1)
Sodium: 143 mmol/L (ref 135–145)

## 2020-07-08 LAB — CBC
HCT: 44.3 % (ref 39.0–52.0)
Hemoglobin: 13.9 g/dL (ref 13.0–17.0)
MCH: 32.5 pg (ref 26.0–34.0)
MCHC: 31.4 g/dL (ref 30.0–36.0)
MCV: 103.5 fL — ABNORMAL HIGH (ref 80.0–100.0)
Platelets: 83 10*3/uL — ABNORMAL LOW (ref 150–400)
RBC: 4.28 MIL/uL (ref 4.22–5.81)
RDW: 16.2 % — ABNORMAL HIGH (ref 11.5–15.5)
WBC: 12 10*3/uL — ABNORMAL HIGH (ref 4.0–10.5)
nRBC: 0 % (ref 0.0–0.2)

## 2020-07-08 LAB — MAGNESIUM: Magnesium: 2 mg/dL (ref 1.7–2.4)

## 2020-07-08 MED ORDER — FUROSEMIDE 10 MG/ML IJ SOLN
80.0000 mg | Freq: Two times a day (BID) | INTRAMUSCULAR | Status: DC
  Administered 2020-07-08 – 2020-07-09 (×2): 80 mg via INTRAVENOUS
  Filled 2020-07-08 (×2): qty 8

## 2020-07-08 NOTE — Progress Notes (Signed)
Critical INR 4.6 Dr Leontine Locket paged with results no new orders at this time will continue to monitor throughout shift.

## 2020-07-08 NOTE — Progress Notes (Signed)
ANTICOAGULATION CONSULT NOTE - Pharmacy Consult for warfarin Indication: atrial fibrillation  Allergies  Allergen Reactions   Benadryl [Diphenhydramine Hcl] Nausea And Vomiting    Patient Measurements: Height: 5\' 6"  (167.6 cm) Weight: 78.1 kg (172 lb 2.9 oz) IBW/kg (Calculated) : 63.8  Vital Signs: Temp: 97.9 F (36.6 C) (06/18 0531) Temp Source: Oral (06/18 0531) BP: 127/51 (06/18 0927) Pulse Rate: 69 (06/18 0927)  Labs: Recent Labs    07/05/20 1046 07/05/20 1117 07/05/20 1256 07/06/20 0513 07/07/20 0627 07/08/20 0656  HGB  --    < > 14.6  --  13.7 13.9  HCT  --   --  46.8  --  44.6 44.3  PLT  --   --  98*  --  92* 83*  LABPROT  --    < >  --  45.4* 46.2* 43.4*  INR  --    < >  --  4.9* 5.0* 4.6*  CREATININE 1.25*  --   --  1.36* 1.36* 1.00  TROPONINIHS 91*  --  88*  --   --   --    < > = values in this interval not displayed.     Estimated Creatinine Clearance: 57.9 mL/min (by C-G formula based on SCr of 1 mg/dL).   Medical History: Past Medical History:  Diagnosis Date   Atrial fibrillation ([place])    CAD (coronary artery disease) 1996   status post PCI of the RCA    Community acquired pneumonia 08/24/2015   Congestive heart failure, unspecified    COPD (chronic obstructive pulmonary disease) (HCC)    Pt on home O2 at night and PRN, unsure of date of diagnosis.   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HCC)    Nephrolithiasis    Other primary cardiomyopathies    Personal history of DVT (deep vein thrombosis)    Prostatitis    Shingles     Medications:  Medications Prior to Admission  Medication Sig Dispense Refill Last Dose   acetaminophen (TYLENOL) 500 MG tablet Take 1,000 mg by mouth every 6 (six) hours as needed for moderate pain.    PRN   albuterol (ACCUNEB) 1.25 MG/3ML nebulizer solution INHALE CONTENTS OF 1  VIAL IN NEBULIZER EVERY 6 HOURS AS NEEDED. (Patient taking differently: Take 1 ampule by nebulization every 6 (six) hours as needed for wheezing or shortness of breath.) 360 mL 5 Past Week   albuterol (VENTOLIN HFA) 108 (90 Base) MCG/ACT inhaler INHALE 1-2 PUFFS INTO THE LUNGS EVERY 6 HOURS AS NEEDED FOR WHEEZING OR SHORTNESS OF BREATH (Patient taking differently: Inhale 1-2 puffs into the lungs every 6 (six) hours as needed for shortness of breath or wheezing.) 8.5 g 5 Past Week   alfuzosin (UROXATRAL) 10 MG 24 hr tablet Take 10 mg by mouth daily.   07/05/2020   allopurinol (ZYLOPRIM) 100 MG tablet Take 1 tablet (100 mg total) daily by mouth. 90 tablet 0 07/05/2020   atorvastatin (LIPITOR) 10 MG tablet TAKE ONE TABLET BY MOUTH DAILY (Patient taking differently: Take 10 mg by mouth daily.) 30 tablet 11 07/04/2020   benzonatate (TESSALON) 100 MG capsule Take 1 capsule (100 mg total) by mouth every 6 (six) hours as needed for cough. 30 capsule 1 PRN   carvedilol (COREG) 12.5 MG tablet TAKE ONE TABLET BY MOUTH TWICE DAILY. TAKE WITH  A MEAL. (Patient taking differently: Take 12.5 mg by mouth 2 (two) times daily with a meal.) 180 tablet 1 07/05/2020 at am   cetirizine (ZYRTEC) 10 MG tablet Take 10 mg by mouth daily.   07/05/2020   Cholecalciferol 25 MCG (1000 UT) tablet Take 1 tablet by mouth daily.   07/05/2020   digoxin (LANOXIN) 0.125 MG tablet TAKE 1/2 TABLET BY MOUTH DAILY (Patient taking differently: Take 0.0625 mg by mouth daily.) 45 tablet 2 07/05/2020   fluticasone (FLONASE) 50 MCG/ACT nasal spray Place 2 sprays into both nostrils at bedtime as needed for allergies. 16 g 3 Past Week   Fluticasone-Umeclidin-Vilant (TRELEGY ELLIPTA) 100-62.5-25 MCG/INH AEPB INHALE ONE PUFF INTO THE LUNGS DAILY (Patient taking differently: Inhale 1 puff into the lungs daily.) 60 each 1 07/05/2020   furosemide (LASIX) 40 MG tablet Take 2 tablets (80 mg total) by mouth 2 (two) times daily. 120 tablet 3 07/05/2020   potassium  chloride SA (KLOR-CON) 20 MEQ tablet Take 1.5 tablets (30 mEq total) by mouth daily. 135 tablet 3 07/05/2020   vitamin C (ASCORBIC ACID) 500 MG tablet Take 1,000 mg by mouth every morning.   07/05/2020   warfarin (COUMADIN) 1 MG tablet TAKE TWO (2) TABLETS BY MOUTH EVERY DAY OR USE AS DIRECTED (Patient taking differently: Take 2 mg by mouth daily.) 65 tablet 3 07/05/2020 at am   zinc gluconate 50 MG tablet Take 50 mg by mouth daily.   07/05/2020   doxycycline (VIBRA-TABS) 100 MG tablet Take 1 tablet (100 mg total) by mouth 2 (two) times daily. (Patient not taking: No sig reported) 14 tablet 0 Completed Course   predniSONE (STERAPRED UNI-PAK 21 TAB) 10 MG (21) TBPK tablet Take by mouth daily. Take 40mg  daily for 3 days, then 30mg  daily for 3 days, then 20mg  daily for 3 days, then 10mg  daily for 3 days, then stop (Patient not taking: No sig reported) 30 tablet 0 Completed Course    Assessment: Pharmacy consulted to dose warfarin in patient with atrial fibrillation.  INR on admission is supratherapeutic at 4.8.  Home dose listed as 1 mg on Wed and 2 mg ROW.  INR 4.6, supratherapeutic  Goal of Therapy:  INR 2-3 Monitor platelets by anticoagulation protocol: Yes   Plan:  Hold warfarin today Monitor daily INR and s/s of bleeding.  Thomasenia Sales, PharmD, MBA, BCGP Clinical Pharmacist  07/08/2020 9:44 AM

## 2020-07-08 NOTE — Progress Notes (Signed)
Telemetry stated patient had sinus tachycardia rate was 140 on the monitor. Dr Manuella Ghazi made aware. Will continue to monitor throughout shift

## 2020-07-08 NOTE — Progress Notes (Signed)
PROGRESS NOTE    Troy [name]  QMV:[id number] DOB: 1940-08-21 DOA: 07/05/2020 PCP: Patient, No Pcp Per (Inactive)   Brief Narrative:   Troy [name] is a 80 y.o. male with medical history of NICM with EF 25-30%; CKD stage III, COPD, proximal atrial fibrillation, chronic respiratory failure on 3 L, impaired glucose tolerance, coronary artery disease, hyperlipidemia presenting with 2 to 3-day history of shortness of breath and worsening lower extremity edema.  He has been admitted with acute on chronic systolic congestive heart failure exacerbation and is undergoing diuresis.  He is COVID-19 test is noted to be positive and he is on isolation for this, but does not appear to require any treatment.  He is on his chronic 3 L nasal cannula oxygen.  Assessment & Plan:   Active Problems:   Biventricular implantable cardioverter-defibrillator in situ   Acute on chronic congestive heart failure (HCC)   Coronary artery disease involving native coronary artery of native heart without angina pectoris   Paroxysmal atrial fibrillation (HCC)   Controlled type 2 diabetes mellitus without complication, without long-term current use of insulin (HCC)   Mixed hyperlipidemia   Chronic respiratory failure with hypoxia (HCC)   Acute on chronic systolic CHF (congestive heart failure) (HCC)   Secondary hypercoagulable state ([place])   Acute on chronic systolic CHF exacerbation-improving -Continue diuresis with Lasix 80 mg IV, continue 3 times daily dosing -Appreciate cardiology evaluation and recommendations -2D echocardiogram showing LVEF 20-25% with severely decreased function; previously 25-30% -Strict I's and O's   COVID-19 infection -Does not appear to have any significant hypoxemia or pneumonia as a result -Procalcitonin and inflammatory markers low   History of paroxysmal atrial fibrillation with supratherapeutic INR -Continue to monitor and hold Coumadin -No active bleeding  noted -Continue Coreg for rate control, currently with paced rhythm   CKD stage IIIa-stable -Continue to monitor with aggressive diuresis   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 Crissie Sickles   Acute on chronic hypoxemic respiratory failure -Normally wears 3 L nasal cannula at home currently at 5 L   CAD/elevated troponin/dyslipidemia -No chest pain and EKG with paced rhythm -Appreciate cardiology evaluation -Continue statin   Essential hypertension-stable -Continue carvedilol -Monitor closely with aggressive diuresis   COPD -No acute exacerbation noted -Continue bronchodilators     DVT prophylaxis: Coumadin Code Status: Full Family Communication: Niece on phone 6/17 Disposition Plan:  Status is: Inpatient   Remains inpatient appropriate because:IV treatments appropriate due to intensity of illness or inability to take PO   Dispo: The patient is from: Home              Anticipated d/c is to: Home              Patient currently is not medically stable to d/c.              Difficult to place patient No   Consultants:  Cardiology   Procedures:  See below   Antimicrobials:  None  Subjective: Patient seen and evaluated today and appears to be gradually improving from a fluid standpoint, but continues to remain on 5 L nasal cannula.  Objective: Vitals:   07/07/20 2038 07/08/20 0531 07/08/20 0545 07/08/20 0927  BP: (!) 130/59 (!) 131/57  (!) 127/51  Pulse: 68 (!) 52  69  Resp: 17 17    Temp: 98.2 F (36.8 C) 97.9 F (36.6 C)    TempSrc: Oral Oral    SpO2: 100%  98%    Weight:   78.1 kg   Height:        Intake/Output Summary (Last 24 hours) at 07/08/2020 1130 Last data filed at 07/08/2020 0830 Gross per 24 hour  Intake 720 ml  Output 850 ml  Net -130 ml   Filed Weights   07/06/20 0500 07/07/20 0531 07/08/20 0545  Weight: 78.4 kg 81.4 kg 78.1 kg    Examination:  General exam: Appears calm and comfortable   Respiratory system: Clear to auscultation. Respiratory effort normal.  Currently on 5 L nasal cannula oxygen. Cardiovascular system: S1 & S2 heard, RRR.  Gastrointestinal system: Abdomen is soft Central nervous system: Alert and awake Extremities: Scant bilateral edema Skin: No significant lesions noted Psychiatry: Flat affect.    Data Reviewed: I have personally reviewed following labs and imaging studies  CBC: Recent Labs  Lab 07/05/20 1256 07/07/20 0627 07/08/20 0656  WBC 15.0* 11.6* 12.0*  HGB 14.6 13.7 13.9  HCT 46.8 44.6 44.3  MCV 103.8* 104.2* 103.5*  PLT 98* 92* 83*   Basic Metabolic Panel: Recent Labs  Lab 07/05/20 1046 07/06/20 0513 07/07/20 0627 07/08/20 0656  NA 141 143 142 143  K 3.6 4.2 4.1 4.4  CL 93* 96* 93* 93*  CO2 39* 42* 43* 41*  GLUCOSE 150* 112* 148* 121*  BUN 36* 37* 34* 27*  CREATININE 1.25* 1.36* 1.36* 1.00  CALCIUM 9.0 8.7* 8.5* 8.3*  MG  --   --  2.1 2.0   GFR: Estimated Creatinine Clearance: 57.9 mL/min (by C-G formula based on SCr of 1 mg/dL). Liver Function Tests: Recent Labs  Lab 07/05/20 1046  AST 19  ALT 30  ALKPHOS 78  BILITOT 1.1  PROT 6.2*  ALBUMIN 3.4*   No results for input(s): LIPASE, AMYLASE in the last 168 hours. No results for input(s): AMMONIA in the last 168 hours. Coagulation Profile: Recent Labs  Lab 07/05/20 1117 07/06/20 0513 07/07/20 0627 07/08/20 0656  INR 4.8* 4.9* 5.0* 4.6*   Cardiac Enzymes: No results for input(s): CKTOTAL, CKMB, CKMBINDEX, TROPONINI in the last 168 hours. BNP (last 3 results) No results for input(s): PROBNP in the last 8760 hours. HbA1C: No results for input(s): HGBA1C in the last 72 hours. CBG: Recent Labs  Lab 07/07/20 2045  GLUCAP 199*   Lipid Profile: No results for input(s): CHOL, HDL, LDLCALC, TRIG, CHOLHDL, LDLDIRECT in the last 72 hours. Thyroid Function Tests: No results for input(s): TSH, T4TOTAL, FREET4, T3FREE, THYROIDAB in the last 72 hours. Anemia  Panel: Recent Labs    07/05/20 1256  FERRITIN 72   Sepsis Labs: Recent Labs  Lab 07/05/20 1256  PROCALCITON <0.10    Recent Results (from the past 240 hour(s))  Resp Panel by RT-PCR (Flu A&B, Covid) Nasopharyngeal Swab     Status: Abnormal   Collection Time: 07/05/20 11:02 AM   Specimen: Nasopharyngeal Swab; Nasopharyngeal(NP) swabs in vial transport medium  Result Value Ref Range Status   SARS Coronavirus 2 by RT PCR POSITIVE (A) NEGATIVE Final    Comment: RESULT CALLED TO, READ BACK BY AND VERIFIED WITH: M. CUGINO 07/05/20 @1202  BY S. BEARD (NOTE) SARS-CoV-2 target nucleic acids are DETECTED.  The SARS-CoV-2 RNA is generally detectable in upper respiratory specimens during the acute phase of infection. Positive results are indicative of the presence of the identified virus, but do not rule out bacterial infection or co-infection with other pathogens not detected by the test. Clinical correlation with patient history and other diagnostic information is  necessary to determine patient infection status. The expected result is Negative.  Fact Sheet for Patients: EntrepreneurPulse.com.au  Fact Sheet for Healthcare Providers: IncredibleEmployment.be  This test is not yet approved or cleared by the [place] FDA and  has been authorized for detection and/or diagnosis of SARS-CoV-2 by FDA under an Emergency Use Authorization (EUA).  This EUA will remain in effect (meaning this test can  be used) for the duration of  the COVID-19 declaration under Section 564(b)(1) of the Act, 21 U.S.C. section 360bbb-3(b)(1), unless the authorization is terminated or revoked sooner.     Influenza A by PCR NEGATIVE NEGATIVE Final   Influenza B by PCR NEGATIVE NEGATIVE Final    Comment: (NOTE) The Xpert Xpress SARS-CoV-2/FLU/RSV plus assay is intended as an aid in the diagnosis of influenza from Nasopharyngeal swab specimens and should not be used as a  sole basis for treatment. Nasal washings and aspirates are unacceptable for Xpert Xpress SARS-CoV-2/FLU/RSV testing.  Fact Sheet for Patients: EntrepreneurPulse.com.au  Fact Sheet for Healthcare Providers: IncredibleEmployment.be  This test is not yet approved or cleared by the [place] FDA and has been authorized for detection and/or diagnosis of SARS-CoV-2 by FDA under an Emergency Use Authorization (EUA). This EUA will remain in effect (meaning this test can be used) for the duration of the COVID-19 declaration under Section 564(b)(1) of the Act, 21 U.S.C. section 360bbb-3(b)(1), unless the authorization is terminated or revoked.  Performed at [facility], [address] [place], [place] [address]          Radiology Studies: [facility] Chest [facility] 1 View  Result Date: 07/07/2020 CLINICAL DATA:  Shortness of breath EXAM: PORTABLE CHEST 1 VIEW COMPARISON:  07/05/2020 FINDINGS: Persistent right greater than left pleural effusions with bibasilar atelectasis. Possible mild interstitial edema superimposed on chronic interstitial changes. Stable cardiomegaly. Left chest wall ICD. IMPRESSION: No substantial change. Persistent right greater than left pleural effusions with bibasilar atelectasis. Possible mild interstitial edema superimposed on chronic interstitial changes. Electronically Signed   By: Macy Mis M.D.   On: 07/07/2020 16:25   ECHOCARDIOGRAM COMPLETE  Result Date: 07/07/2020    ECHOCARDIOGRAM REPORT   Patient Name:   Troy [name] Date of Exam: 07/07/2020 Medical Rec #:  [id number]         Height:       66.0 in Accession #:    [id number]        Weight:       179.5 lb Date of Birth:  [birth year]        BSA:          1.910 m Patient Age:    76 years          BP:           141/76 mmHg Patient Gender: M                 HR:           70 bpm. Exam Location:  [facility] Procedure: 2D Echo, Cardiac Doppler and Color Doppler Indications:    CHF   History:        Patient has prior history of Echocardiogram examinations, most                 recent 02/01/2017. CHF and Cardiomyopathy, CAD, Defibrillator,                 COPD, Arrythmias:Atrial Fibrillation, Signs/Symptoms:Shortness  of Breath; Risk Factors:Dyslipidemia and LE edema. COVID+.  Sonographer:    Dustin Flock RDCS Referring Phys: [phone] DAVID TAT  Sonographer Comments: Image acquisition challenging due to COPD and Image acquisition challenging due to respiratory motion. IMPRESSIONS  1. Left ventricular ejection fraction, by estimation, is 20 to 25%. The left ventricle has severely decreased function. The left ventricle demonstrates global hypokinesis. There is mild left ventricular hypertrophy. Left ventricular diastolic parameters  are indeterminate.  2. Right ventricular systolic function is normal. The right ventricular size is normal.  3. Left atrial size was severely dilated.  4. Right atrial size was mild to moderately dilated.  5. The mitral valve is normal in structure. Mild mitral valve regurgitation. No evidence of mitral stenosis.  6. The aortic valve is tricuspid. There is mild calcification of the aortic valve. There is mild thickening of the aortic valve. Aortic valve regurgitation is mild.  7. The inferior vena cava is normal in size with greater than 50% respiratory variability, suggesting right atrial pressure of 3 mmHg. FINDINGS  Left Ventricle: Left ventricular ejection fraction, by estimation, is 20 to 25%. The left ventricle has severely decreased function. The left ventricle demonstrates global hypokinesis. The left ventricular internal cavity size was normal in size. There is mild left ventricular hypertrophy. Left ventricular diastolic parameters are indeterminate. Right Ventricle: The right ventricular size is normal. No increase in right ventricular wall thickness. Right ventricular systolic function is normal. Left Atrium: Left atrial size was severely  dilated. Right Atrium: Right atrial size was mild to moderately dilated. Pericardium: There is no evidence of pericardial effusion. Mitral Valve: The mitral valve is normal in structure. There is mild thickening of the mitral valve leaflet(s). There is mild calcification of the mitral valve leaflet(s). Mild mitral annular calcification. Mild mitral valve regurgitation. No evidence of  mitral valve stenosis. Tricuspid Valve: The tricuspid valve is not well visualized. Tricuspid valve regurgitation is mild . No evidence of tricuspid stenosis. Aortic Valve: The aortic valve is tricuspid. There is mild calcification of the aortic valve. There is mild thickening of the aortic valve. There is mild aortic valve annular calcification. Aortic valve regurgitation is mild. Aortic regurgitation PHT measures 574 msec. Aortic valve mean gradient measures 4.5 mmHg. Aortic valve peak gradient measures 7.9 mmHg. Aortic valve area, by VTI measures 3.68 cm. Pulmonic Valve: The pulmonic valve was not well visualized. Pulmonic valve regurgitation is not visualized. No evidence of pulmonic stenosis. Aorta: The aortic root is normal in size and structure. Pulmonary Artery: Indeterminant PASP, inadequate TR jet. Venous: The inferior vena cava is normal in size with greater than 50% respiratory variability, suggesting right atrial pressure of 3 mmHg. IAS/Shunts: The interatrial septum was not well visualized. Additional Comments: A device lead is visualized.  LEFT VENTRICLE PLAX 2D LVIDd:         5.81 cm      Diastology LVIDs:         4.93 cm      LV e' medial:    4.03 cm/s LV PW:         1.20 cm      LV E/e' medial:  20.5 LV IVS:        1.24 cm      LV e' lateral:   4.03 cm/s LVOT diam:     2.70 cm      LV E/e' lateral: 20.5 LV SV:         97 LV SV Index:   51 LVOT  Area:     5.73 cm  LV Volumes (MOD) LV vol d, MOD A4C: 193.0 ml LV vol s, MOD A4C: 132.0 ml LV SV MOD A4C:     193.0 ml RIGHT VENTRICLE RV Basal diam:  3.74 cm RV S prime:      12.20 cm/s LEFT ATRIUM             Index       RIGHT ATRIUM           Index LA diam:        4.60 cm 2.41 cm/m  RA Area:     21.40 cm LA Vol (A2C):   74.3 ml 38.91 ml/m RA Volume:   68.30 ml  35.76 ml/m LA Vol (A4C):   89.3 ml 46.76 ml/m LA Biplane Vol: 86.8 ml 45.45 ml/m  AORTIC VALVE AV Area (Vmax):    3.97 cm AV Area (Vmean):   3.86 cm AV Area (VTI):     3.68 cm AV Vmax:           140.47 cm/s AV Vmean:          100.930 cm/s AV VTI:            0.263 m AV Peak Grad:      7.9 mmHg AV Mean Grad:      4.5 mmHg LVOT Vmax:         97.30 cm/s LVOT Vmean:        68.100 cm/s LVOT VTI:          0.169 m LVOT/AV VTI ratio: 0.64 AI PHT:            574 msec  AORTA Ao Root diam: 3.10 cm MITRAL VALVE MV Area (PHT): 6.02 cm    SHUNTS MV Decel Time: 126 msec    Systemic VTI:  0.17 m MV E velocity: 82.70 cm/s  Systemic Diam: 2.70 cm MV A velocity: 52.80 cm/s MV E/A ratio:  1.57 Carlyle Dolly MD Electronically signed by Carlyle Dolly MD Signature Date/Time: 07/07/2020/5:13:03 PM    Final         Scheduled Meds:  alfuzosin  10 mg Oral Daily   allopurinol  100 mg Oral Daily   atorvastatin  10 mg Oral Daily   carvedilol  12.5 mg Oral BID WC   digoxin  62.5 mcg Oral Daily   furosemide  80 mg Intravenous TID   loratadine  10 mg Oral Daily   potassium chloride SA  30 mEq Oral Daily   sodium chloride flush  3 mL Intravenous Q12H   Continuous Infusions:  sodium chloride       LOS: 3 days    Time spent: 35 minutes    Alysabeth Scalia Darleen Crocker, DO [facility]  If 7PM-7AM, please contact night-coverage [email or web address] 07/08/2020, 11:30 AM

## 2020-07-09 DIAGNOSIS — I5043 Acute on chronic combined systolic (congestive) and diastolic (congestive) heart failure: Secondary | ICD-10-CM | POA: Diagnosis not present

## 2020-07-09 LAB — CBC
HCT: 43.2 % (ref 39.0–52.0)
Hemoglobin: 13.1 g/dL (ref 13.0–17.0)
MCH: 31.8 pg (ref 26.0–34.0)
MCHC: 30.3 g/dL (ref 30.0–36.0)
MCV: 104.9 fL — ABNORMAL HIGH (ref 80.0–100.0)
Platelets: 93 10*3/uL — ABNORMAL LOW (ref 150–400)
RBC: 4.12 MIL/uL — ABNORMAL LOW (ref 4.22–5.81)
RDW: 16.4 % — ABNORMAL HIGH (ref 11.5–15.5)
WBC: 10.3 10*3/uL (ref 4.0–10.5)
nRBC: 0 % (ref 0.0–0.2)

## 2020-07-09 LAB — BASIC METABOLIC PANEL
Anion gap: 7 (ref 5–15)
BUN: 31 mg/dL — ABNORMAL HIGH (ref 8–23)
CO2: 47 mmol/L — ABNORMAL HIGH (ref 22–32)
Calcium: 8.5 mg/dL — ABNORMAL LOW (ref 8.9–10.3)
Chloride: 89 mmol/L — ABNORMAL LOW (ref 98–111)
Creatinine, Ser: 1.21 mg/dL (ref 0.61–1.24)
GFR, Estimated: >60 mL/min (ref >60)
Glucose, Bld: 191 mg/dL — ABNORMAL HIGH (ref 70–99)
Potassium: 4.5 mmol/L (ref 3.5–5.1)
Sodium: 143 mmol/L (ref 135–145)

## 2020-07-09 LAB — PROTIME-INR
INR: 3.1 — ABNORMAL HIGH (ref 0.8–1.2)
Prothrombin Time: 32.3 seconds — ABNORMAL HIGH (ref 11.4–15.2)

## 2020-07-09 LAB — MAGNESIUM: Magnesium: 2.1 mg/dL (ref 1.7–2.4)

## 2020-07-09 MED ORDER — DM-GUAIFENESIN ER 30-600 MG PO TB12
1.0000 | ORAL_TABLET | Freq: Two times a day (BID) | ORAL | Status: DC
  Administered 2020-07-09 – 2020-07-11 (×5): 1 via ORAL
  Filled 2020-07-09 (×5): qty 1

## 2020-07-09 MED ORDER — FUROSEMIDE 80 MG PO TABS
80.0000 mg | ORAL_TABLET | Freq: Two times a day (BID) | ORAL | Status: DC
  Administered 2020-07-09 – 2020-07-10 (×2): 80 mg via ORAL
  Filled 2020-07-09 (×2): qty 1

## 2020-07-09 NOTE — Progress Notes (Signed)
ANTICOAGULATION CONSULT NOTE - Pharmacy Consult for warfarin Indication: atrial fibrillation  Allergies  Allergen Reactions   Benadryl [Diphenhydramine Hcl] Nausea And Vomiting    Patient Measurements: Height: 5\' 6"  (167.6 cm) Weight: 75.3 kg (166 lb 0.1 oz) IBW/kg (Calculated) : 63.8  Vital Signs: Temp: 98 F (36.7 C) (06/19 0441) Temp Source: Oral (06/19 0441) BP: 136/54 (06/19 0904) Pulse Rate: 70 (06/19 0904)  Labs: Recent Labs    07/07/20 0627 07/08/20 0656 07/09/20 0701  HGB 13.7 13.9 13.1  HCT 44.6 44.3 43.2  PLT 92* 83* 93*  LABPROT 46.2* 43.4* 32.3*  INR 5.0* 4.6* 3.1*  CREATININE 1.36* 1.00 1.21     Estimated Creatinine Clearance: 43.9 mL/min (by C-G formula based on SCr of 1.21 mg/dL).   Medical History: Past Medical History:  Diagnosis Date   Atrial fibrillation ([place])    CAD (coronary artery disease) 1996   status post PCI of the RCA    Community acquired pneumonia 08/24/2015   Congestive heart failure, unspecified    COPD (chronic obstructive pulmonary disease) (HCC)    Pt on home O2 at night and PRN, unsure of date of diagnosis.   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HCC)    Nephrolithiasis    Other primary cardiomyopathies    Personal history of DVT (deep vein thrombosis)    Prostatitis    Shingles     Medications:  Medications Prior to Admission  Medication Sig Dispense Refill Last Dose   acetaminophen (TYLENOL) 500 MG tablet Take 1,000 mg by mouth every 6 (six) hours as needed for moderate pain.    PRN   albuterol (ACCUNEB) 1.25 MG/3ML nebulizer solution INHALE CONTENTS OF 1 VIAL IN NEBULIZER EVERY 6 HOURS AS NEEDED. (Patient taking differently: Take 1 ampule by nebulization every 6 (six) hours as needed for wheezing or shortness of breath.) 360 mL 5 Past Week   albuterol (VENTOLIN HFA) 108 (90  Base) MCG/ACT inhaler INHALE 1-2 PUFFS INTO THE LUNGS EVERY 6 HOURS AS NEEDED FOR WHEEZING OR SHORTNESS OF BREATH (Patient taking differently: Inhale 1-2 puffs into the lungs every 6 (six) hours as needed for shortness of breath or wheezing.) 8.5 g 5 Past Week   alfuzosin (UROXATRAL) 10 MG 24 hr tablet Take 10 mg by mouth daily.   07/05/2020   allopurinol (ZYLOPRIM) 100 MG tablet Take 1 tablet (100 mg total) daily by mouth. 90 tablet 0 07/05/2020   atorvastatin (LIPITOR) 10 MG tablet TAKE ONE TABLET BY MOUTH DAILY (Patient taking differently: Take 10 mg by mouth daily.) 30 tablet 11 07/04/2020   benzonatate (TESSALON) 100 MG capsule Take 1 capsule (100 mg total) by mouth every 6 (six) hours as needed for cough. 30 capsule 1 PRN   carvedilol (COREG) 12.5 MG tablet TAKE ONE TABLET BY MOUTH TWICE DAILY. TAKE WITH A MEAL. (Patient taking differently: Take 12.5 mg by mouth 2 (two) times daily with a meal.) 180 tablet 1 07/05/2020 at am   cetirizine (ZYRTEC) 10 MG tablet Take 10 mg by mouth daily.   07/05/2020   Cholecalciferol 25 MCG (1000 UT) tablet Take 1 tablet by mouth daily.   07/05/2020   digoxin (LANOXIN) 0.125 MG tablet TAKE 1/2 TABLET BY MOUTH DAILY (Patient taking differently: Take 0.0625 mg by mouth daily.) 45 tablet 2 07/05/2020   fluticasone (FLONASE) 50 MCG/ACT  nasal spray Place 2 sprays into both nostrils at bedtime as needed for allergies. 16 g 3 Past Week   Fluticasone-Umeclidin-Vilant (TRELEGY ELLIPTA) 100-62.5-25 MCG/INH AEPB INHALE ONE PUFF INTO THE LUNGS DAILY (Patient taking differently: Inhale 1 puff into the lungs daily.) 60 each 1 07/05/2020   furosemide (LASIX) 40 MG tablet Take 2 tablets (80 mg total) by mouth 2 (two) times daily. 120 tablet 3 07/05/2020   potassium chloride SA (KLOR-CON) 20 MEQ tablet Take 1.5 tablets (30 mEq total) by mouth daily. 135 tablet 3 07/05/2020   vitamin C (ASCORBIC ACID) 500 MG tablet Take 1,000 mg by mouth every morning.   07/05/2020   warfarin (COUMADIN) 1 MG  tablet TAKE TWO (2) TABLETS BY MOUTH EVERY DAY OR USE AS DIRECTED (Patient taking differently: Take 2 mg by mouth daily.) 65 tablet 3 07/05/2020 at am   zinc gluconate 50 MG tablet Take 50 mg by mouth daily.   07/05/2020   doxycycline (VIBRA-TABS) 100 MG tablet Take 1 tablet (100 mg total) by mouth 2 (two) times daily. (Patient not taking: No sig reported) 14 tablet 0 Completed Course   predniSONE (STERAPRED UNI-PAK 21 TAB) 10 MG (21) TBPK tablet Take by mouth daily. Take 40mg  daily for 3 days, then 30mg  daily for 3 days, then 20mg  daily for 3 days, then 10mg  daily for 3 days, then stop (Patient not taking: No sig reported) 30 tablet 0 Completed Course    Assessment: Pharmacy consulted to dose warfarin in patient with atrial fibrillation.  INR on admission is supratherapeutic at 4.8.  Home dose listed as 1 mg on Wed and 2 mg ROW.  INR 3.1, supratherapeutic  Goal of Therapy:  INR 2-3 Monitor platelets by anticoagulation protocol: Yes   Plan:  Hold warfarin today Monitor daily INR and s/s of bleeding.  Thomasenia Sales, PharmD, MBA, BCGP Clinical Pharmacist  07/09/2020 9:55 AM

## 2020-07-09 NOTE — Progress Notes (Signed)
PROGRESS NOTE    [name]  FBP:[id number] DOB: 1940-12-19 DOA: 07/05/2020 PCP: Patient, No Pcp Per (Inactive)   Brief Narrative:   Troy [name] is a 80 y.o. male with medical history of NICM with EF 25-30%; CKD stage III, COPD, proximal atrial fibrillation, chronic respiratory failure on 3 L, impaired glucose tolerance, coronary artery disease, hyperlipidemia presenting with 2 to 3-day history of shortness of breath and worsening lower extremity edema.  He has been admitted with acute on chronic systolic congestive heart failure exacerbation and is undergoing diuresis.  He is COVID-19 test is noted to be positive and he is on isolation for this, but does not appear to require any treatment.  He is on his chronic 3 L nasal cannula oxygen.  Assessment & Plan:   Active Problems:   Biventricular implantable cardioverter-defibrillator in situ   Acute on chronic congestive heart failure (HCC)   Coronary artery disease involving native coronary artery of native heart without angina pectoris   Paroxysmal atrial fibrillation (HCC)   Controlled type 2 diabetes mellitus without complication, without long-term current use of insulin (HCC)   Mixed hyperlipidemia   Chronic respiratory failure with hypoxia (HCC)   Acute on chronic systolic CHF (congestive heart failure) (HCC)   Secondary hypercoagulable state ([place])   Acute on chronic systolic CHF exacerbation-improving -Continue diuresis now with oral Lasix per home dosing given elevation of creatinine levels -Appreciate cardiology evaluation and recommendations in a.m. -2D echocardiogram showing LVEF 20-25% with severely decreased function; previously 25-30% -Strict I's and O's   COVID-19 infection -Does not appear to have any significant hypoxemia or pneumonia as a result -Procalcitonin and inflammatory markers low   History of paroxysmal atrial fibrillation with supratherapeutic INR -Continue to monitor and hold Coumadin -No  active bleeding noted -Continue Coreg for rate control, currently with paced rhythm   CKD stage IIIa-stable -Creatinine beginning to elevate with aggressive IV diuresis, switch to p.o. -Repeat a.m. 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 Crissie Sickles   Acute on chronic hypoxemic respiratory failure -Normally wears 3 L nasal cannula at home currently at 5 L   CAD/elevated troponin/dyslipidemia -No chest pain and EKG with paced rhythm -Appreciate cardiology evaluation -Continue statin   Essential hypertension-stable -Continue carvedilol -Monitor closely with aggressive diuresis   COPD -No acute exacerbation noted -Continue bronchodilators -Mucolytic's and flutter valve, and up to chair given chest congestion     DVT prophylaxis: Coumadin, with ongoing supratherapeutic INR Code Status: Full Family Communication: Niece on phone 6/17 Disposition Plan:  Status is: Inpatient   Remains inpatient appropriate because:IV treatments appropriate due to intensity of illness or inability to take PO   Dispo: The patient is from: Home              Anticipated d/c is to: Home              Patient currently is not medically stable to d/c.              Difficult to place patient No   Consultants:  Cardiology   Procedures:  See below   Antimicrobials:  None  Subjective: Patient seen and evaluated today with ongoing cough and chest congestion.  He denies chest pain or worsening dyspnea.  Objective: Vitals:   07/08/20 1505 07/08/20 2014 07/09/20 0441 07/09/20 0904  BP: 131/61 140/63 (!) 132/57 (!) 136/54  Pulse: 69 68 70 70  Resp: 17 20 18  Temp: 98.2 F (36.8 C) 97.6 F (36.4 C) 98 F (36.7 C)   TempSrc: Oral Oral Oral   SpO2: 100% (!) 80% 100%   Weight:   75.3 kg   Height:        Intake/Output Summary (Last 24 hours) at 07/09/2020 1318 Last data filed at 07/09/2020 1200 Gross per 24 hour  Intake 843 ml  Output 2000  ml  Net -1157 ml   Filed Weights   07/07/20 0531 07/08/20 0545 07/09/20 0441  Weight: 81.4 kg 78.1 kg 75.3 kg    Examination:  General exam: Appears calm and comfortable  Respiratory system: Congestion/crackles noted bilaterally. Respiratory effort normal.  Currently on 3 L nasal cannula oxygen Cardiovascular system: S1 & S2 heard, RRR.  Gastrointestinal system: Abdomen is soft Central nervous system: Alert and awake Extremities: Scant bilateral edema Skin: No significant lesions noted Psychiatry: Flat affect.    Data Reviewed: I have personally reviewed following labs and imaging studies  CBC: Recent Labs  Lab 07/05/20 1256 07/07/20 0627 07/08/20 0656 07/09/20 0701  WBC 15.0* 11.6* 12.0* 10.3  HGB 14.6 13.7 13.9 13.1  HCT 46.8 44.6 44.3 43.2  MCV 103.8* 104.2* 103.5* 104.9*  PLT 98* 92* 83* 93*   Basic Metabolic Panel: Recent Labs  Lab 07/05/20 1046 07/06/20 0513 07/07/20 0627 07/08/20 0656 07/09/20 0701  NA 141 143 142 143 143  K 3.6 4.2 4.1 4.4 4.5  CL 93* 96* 93* 93* 89*  CO2 39* 42* 43* 41* 47*  GLUCOSE 150* 112* 148* 121* 191*  BUN 36* 37* 34* 27* 31*  CREATININE 1.25* 1.36* 1.36* 1.00 1.21  CALCIUM 9.0 8.7* 8.5* 8.3* 8.5*  MG  --   --  2.1 2.0 2.1   GFR: Estimated Creatinine Clearance: 43.9 mL/min (by C-G formula based on SCr of 1.21 mg/dL). Liver Function Tests: Recent Labs  Lab 07/05/20 1046  AST 19  ALT 30  ALKPHOS 78  BILITOT 1.1  PROT 6.2*  ALBUMIN 3.4*   No results for input(s): LIPASE, AMYLASE in the last 168 hours. No results for input(s): AMMONIA in the last 168 hours. Coagulation Profile: Recent Labs  Lab 07/05/20 1117 07/06/20 0513 07/07/20 0627 07/08/20 0656 07/09/20 0701  INR 4.8* 4.9* 5.0* 4.6* 3.1*   Cardiac Enzymes: No results for input(s): CKTOTAL, CKMB, CKMBINDEX, TROPONINI in the last 168 hours. BNP (last 3 results) No results for input(s): PROBNP in the last 8760 hours. HbA1C: No results for input(s):  HGBA1C in the last 72 hours. CBG: Recent Labs  Lab 07/07/20 2045  GLUCAP 199*   Lipid Profile: No results for input(s): CHOL, HDL, LDLCALC, TRIG, CHOLHDL, LDLDIRECT in the last 72 hours. Thyroid Function Tests: No results for input(s): TSH, T4TOTAL, FREET4, T3FREE, THYROIDAB in the last 72 hours. Anemia Panel: No results for input(s): VITAMINB12, FOLATE, FERRITIN, TIBC, IRON, RETICCTPCT in the last 72 hours. Sepsis Labs: Recent Labs  Lab 07/05/20 1256  PROCALCITON <0.10    Recent Results (from the past 240 hour(s))  Resp Panel by RT-PCR (Flu A&B, Covid) Nasopharyngeal Swab     Status: Abnormal   Collection Time: 07/05/20 11:02 AM   Specimen: Nasopharyngeal Swab; Nasopharyngeal(NP) swabs in vial transport medium  Result Value Ref Range Status   SARS Coronavirus 2 by RT PCR POSITIVE (A) NEGATIVE Final    Comment: RESULT CALLED TO, READ BACK BY AND VERIFIED WITH: M. CUGINO 07/05/20 @1202  BY S. BEARD (NOTE) SARS-CoV-2 target nucleic acids are DETECTED.  The SARS-CoV-2 RNA is generally detectable  in upper respiratory specimens during the acute phase of infection. Positive results are indicative of the presence of the identified virus, but do not rule out bacterial infection or co-infection with other pathogens not detected by the test. Clinical correlation with patient history and other diagnostic information is necessary to determine patient infection status. The expected result is Negative.  Fact Sheet for Patients: EntrepreneurPulse.com.au  Fact Sheet for Healthcare Providers: IncredibleEmployment.be  This test is not yet approved or cleared by the [place] FDA and  has been authorized for detection and/or diagnosis of SARS-CoV-2 by FDA under an Emergency Use Authorization (EUA).  This EUA will remain in effect (meaning this test can  be used) for the duration of  the COVID-19 declaration under Section 564(b)(1) of the Act,  21 U.S.C. section 360bbb-3(b)(1), unless the authorization is terminated or revoked sooner.     Influenza A by PCR NEGATIVE NEGATIVE Final   Influenza B by PCR NEGATIVE NEGATIVE Final    Comment: (NOTE) The Xpert Xpress SARS-CoV-2/FLU/RSV plus assay is intended as an aid in the diagnosis of influenza from Nasopharyngeal swab specimens and should not be used as a sole basis for treatment. Nasal washings and aspirates are unacceptable for Xpert Xpress SARS-CoV-2/FLU/RSV testing.  Fact Sheet for Patients: EntrepreneurPulse.com.au  Fact Sheet for Healthcare Providers: IncredibleEmployment.be  This test is not yet approved or cleared by the [place] FDA and has been authorized for detection and/or diagnosis of SARS-CoV-2 by FDA under an Emergency Use Authorization (EUA). This EUA will remain in effect (meaning this test can be used) for the duration of the COVID-19 declaration under Section 564(b)(1) of the Act, 21 U.S.C. section 360bbb-3(b)(1), unless the authorization is terminated or revoked.  Performed at [facility], [address], [place], [place] [address]          Radiology Studies: [facility] Chest [facility] 1 View  Result Date: 07/07/2020 CLINICAL DATA:  Shortness of breath EXAM: PORTABLE CHEST 1 VIEW COMPARISON:  07/05/2020 FINDINGS: Persistent right greater than left pleural effusions with bibasilar atelectasis. Possible mild interstitial edema superimposed on chronic interstitial changes. Stable cardiomegaly. Left chest wall ICD. IMPRESSION: No substantial change. Persistent right greater than left pleural effusions with bibasilar atelectasis. Possible mild interstitial edema superimposed on chronic interstitial changes. Electronically Signed   By: Macy Mis M.D.   On: 07/07/2020 16:25   ECHOCARDIOGRAM COMPLETE  Result Date: 07/07/2020    ECHOCARDIOGRAM REPORT   Patient Name:   Troy [name] Date of Exam: 07/07/2020 Medical Rec #:   [id number]         Height:       66.0 in Accession #:    [id number]        Weight:       179.5 lb Date of Birth:  08/31/1940        BSA:          1.910 m Patient Age:    38 years          BP:           141/76 mmHg Patient Gender: M                 HR:           70 bpm. Exam Location:  [facility] Procedure: 2D Echo, Cardiac Doppler and Color Doppler Indications:    CHF  History:        Patient has prior history of Echocardiogram examinations, most  recent 02/01/2017. CHF and Cardiomyopathy, CAD, Defibrillator,                 COPD, Arrythmias:Atrial Fibrillation, Signs/Symptoms:Shortness                 of Breath; Risk Factors:Dyslipidemia and LE edema. COVID+.  Sonographer:    Dustin Flock RDCS Referring Phys: [phone] DAVID TAT  Sonographer Comments: Image acquisition challenging due to COPD and Image acquisition challenging due to respiratory motion. IMPRESSIONS  1. Left ventricular ejection fraction, by estimation, is 20 to 25%. The left ventricle has severely decreased function. The left ventricle demonstrates global hypokinesis. There is mild left ventricular hypertrophy. Left ventricular diastolic parameters  are indeterminate.  2. Right ventricular systolic function is normal. The right ventricular size is normal.  3. Left atrial size was severely dilated.  4. Right atrial size was mild to moderately dilated.  5. The mitral valve is normal in structure. Mild mitral valve regurgitation. No evidence of mitral stenosis.  6. The aortic valve is tricuspid. There is mild calcification of the aortic valve. There is mild thickening of the aortic valve. Aortic valve regurgitation is mild.  7. The inferior vena cava is normal in size with greater than 50% respiratory variability, suggesting right atrial pressure of 3 mmHg. FINDINGS  Left Ventricle: Left ventricular ejection fraction, by estimation, is 20 to 25%. The left ventricle has severely decreased function. The left ventricle demonstrates global  hypokinesis. The left ventricular internal cavity size was normal in size. There is mild left ventricular hypertrophy. Left ventricular diastolic parameters are indeterminate. Right Ventricle: The right ventricular size is normal. No increase in right ventricular wall thickness. Right ventricular systolic function is normal. Left Atrium: Left atrial size was severely dilated. Right Atrium: Right atrial size was mild to moderately dilated. Pericardium: There is no evidence of pericardial effusion. Mitral Valve: The mitral valve is normal in structure. There is mild thickening of the mitral valve leaflet(s). There is mild calcification of the mitral valve leaflet(s). Mild mitral annular calcification. Mild mitral valve regurgitation. No evidence of  mitral valve stenosis. Tricuspid Valve: The tricuspid valve is not well visualized. Tricuspid valve regurgitation is mild . No evidence of tricuspid stenosis. Aortic Valve: The aortic valve is tricuspid. There is mild calcification of the aortic valve. There is mild thickening of the aortic valve. There is mild aortic valve annular calcification. Aortic valve regurgitation is mild. Aortic regurgitation PHT measures 574 msec. Aortic valve mean gradient measures 4.5 mmHg. Aortic valve peak gradient measures 7.9 mmHg. Aortic valve area, by VTI measures 3.68 cm. Pulmonic Valve: The pulmonic valve was not well visualized. Pulmonic valve regurgitation is not visualized. No evidence of pulmonic stenosis. Aorta: The aortic root is normal in size and structure. Pulmonary Artery: Indeterminant PASP, inadequate TR jet. Venous: The inferior vena cava is normal in size with greater than 50% respiratory variability, suggesting right atrial pressure of 3 mmHg. IAS/Shunts: The interatrial septum was not well visualized. Additional Comments: A device lead is visualized.  LEFT VENTRICLE PLAX 2D LVIDd:         5.81 cm      Diastology LVIDs:         4.93 cm      LV e' medial:    4.03 cm/s LV  PW:         1.20 cm      LV E/e' medial:  20.5 LV IVS:        1.24 cm  LV e' lateral:   4.03 cm/s LVOT diam:     2.70 cm      LV E/e' lateral: 20.5 LV SV:         97 LV SV Index:   51 LVOT Area:     5.73 cm  LV Volumes (MOD) LV vol d, MOD A4C: 193.0 ml LV vol s, MOD A4C: 132.0 ml LV SV MOD A4C:     193.0 ml RIGHT VENTRICLE RV Basal diam:  3.74 cm RV S prime:     12.20 cm/s LEFT ATRIUM             Index       RIGHT ATRIUM           Index LA diam:        4.60 cm 2.41 cm/m  RA Area:     21.40 cm LA Vol (A2C):   74.3 ml 38.91 ml/m RA Volume:   68.30 ml  35.76 ml/m LA Vol (A4C):   89.3 ml 46.76 ml/m LA Biplane Vol: 86.8 ml 45.45 ml/m  AORTIC VALVE AV Area (Vmax):    3.97 cm AV Area (Vmean):   3.86 cm AV Area (VTI):     3.68 cm AV Vmax:           140.47 cm/s AV Vmean:          100.930 cm/s AV VTI:            0.263 m AV Peak Grad:      7.9 mmHg AV Mean Grad:      4.5 mmHg LVOT Vmax:         97.30 cm/s LVOT Vmean:        68.100 cm/s LVOT VTI:          0.169 m LVOT/AV VTI ratio: 0.64 AI PHT:            574 msec  AORTA Ao Root diam: 3.10 cm MITRAL VALVE MV Area (PHT): 6.02 cm    SHUNTS MV Decel Time: 126 msec    Systemic VTI:  0.17 m MV E velocity: 82.70 cm/s  Systemic Diam: 2.70 cm MV A velocity: 52.80 cm/s MV E/A ratio:  1.57 Carlyle Dolly MD Electronically signed by Carlyle Dolly MD Signature Date/Time: 07/07/2020/5:13:03 PM    Final         Scheduled Meds:  alfuzosin  10 mg Oral Daily   allopurinol  100 mg Oral Daily   atorvastatin  10 mg Oral Daily   carvedilol  12.5 mg Oral BID WC   dextromethorphan-guaiFENesin  1 tablet Oral BID   digoxin  62.5 mcg Oral Daily   furosemide  80 mg Oral BID   loratadine  10 mg Oral Daily   potassium chloride SA  30 mEq Oral Daily   sodium chloride flush  3 mL Intravenous Q12H   Continuous Infusions:  sodium chloride       LOS: 4 days    Time spent: 35 minutes    Minor Iden Darleen Crocker, DO [facility]  If 7PM-7AM, please contact  night-coverage [email or web address] 07/09/2020, 1:18 PM

## 2020-07-10 DIAGNOSIS — I5043 Acute on chronic combined systolic (congestive) and diastolic (congestive) heart failure: Secondary | ICD-10-CM | POA: Diagnosis not present

## 2020-07-10 DIAGNOSIS — I5023 Acute on chronic systolic (congestive) heart failure: Secondary | ICD-10-CM | POA: Diagnosis not present

## 2020-07-10 LAB — MAGNESIUM: Magnesium: 1.9 mg/dL (ref 1.7–2.4)

## 2020-07-10 LAB — BASIC METABOLIC PANEL
Anion gap: 9 (ref 5–15)
BUN: 25 mg/dL — ABNORMAL HIGH (ref 8–23)
CO2: 44 mmol/L — ABNORMAL HIGH (ref 22–32)
Calcium: 8.9 mg/dL (ref 8.9–10.3)
Chloride: 88 mmol/L — ABNORMAL LOW (ref 98–111)
Creatinine, Ser: 0.99 mg/dL (ref 0.61–1.24)
GFR, Estimated: >60 mL/min (ref >60)
Glucose, Bld: 115 mg/dL — ABNORMAL HIGH (ref 70–99)
Potassium: 5 mmol/L (ref 3.5–5.1)
Sodium: 141 mmol/L (ref 135–145)

## 2020-07-10 LAB — PROTIME-INR
INR: 1.9 — ABNORMAL HIGH (ref 0.8–1.2)
Prothrombin Time: 21.4 seconds — ABNORMAL HIGH (ref 11.4–15.2)

## 2020-07-10 LAB — HEMOGLOBIN A1C
Hgb A1c MFr Bld: 7.1 % — ABNORMAL HIGH (ref 4.8–5.6)
Mean Plasma Glucose: 157 mg/dL

## 2020-07-10 MED ORDER — WARFARIN - PHARMACIST DOSING INPATIENT: Freq: Every day | Status: DC

## 2020-07-10 MED ORDER — SODIUM CHLORIDE 0.9 % IV BOLUS
250.0000 mL | Freq: Once | INTRAVENOUS | Status: AC
  Administered 2020-07-10: 250 mL via INTRAVENOUS

## 2020-07-10 MED ORDER — WARFARIN SODIUM 1 MG PO TABS
2.0000 mg | ORAL_TABLET | Freq: Once | ORAL | Status: AC
  Administered 2020-07-10: 2 mg via ORAL
  Filled 2020-07-10: qty 2

## 2020-07-10 MED ORDER — HYDRALAZINE HCL 10 MG PO TABS
10.0000 mg | ORAL_TABLET | Freq: Three times a day (TID) | ORAL | Status: DC
  Administered 2020-07-10 – 2020-07-11 (×2): 10 mg via ORAL
  Filled 2020-07-10 (×2): qty 1

## 2020-07-10 MED ORDER — FUROSEMIDE 10 MG/ML IJ SOLN
80.0000 mg | Freq: Once | INTRAMUSCULAR | Status: AC
  Administered 2020-07-10: 80 mg via INTRAVENOUS
  Filled 2020-07-10: qty 8

## 2020-07-10 MED ORDER — ISOSORBIDE MONONITRATE ER 60 MG PO TB24: 30.0000 mg | ORAL_TABLET | Freq: Every day | ORAL | Status: DC

## 2020-07-10 NOTE — Progress Notes (Signed)
ANTICOAGULATION CONSULT NOTE - Pharmacy Consult for warfarin Indication: atrial fibrillation  Allergies  Allergen Reactions   Benadryl [Diphenhydramine Hcl] Nausea And Vomiting    Patient Measurements: Height: 5\' 6"  (167.6 cm) Weight: 71.7 kg (158 lb 1.1 oz) IBW/kg (Calculated) : 63.8  Vital Signs: Temp: 98.6 F (37 C) (06/20 0552) Temp Source: Oral (06/20 0552) BP: 139/61 (06/20 0552) Pulse Rate: 70 (06/20 0552)  Labs: Recent Labs    07/08/20 0656 07/09/20 0701 07/10/20 0708  HGB 13.9 13.1  --   HCT 44.3 43.2  --   PLT 83* 93*  --   LABPROT 43.4* 32.3* 21.4*  INR 4.6* 3.1* 1.9*  CREATININE 1.00 1.21 0.99     Estimated Creatinine Clearance: 53.7 mL/min (by C-G formula based on SCr of 0.99 mg/dL).   Medical History: Past Medical History:  Diagnosis Date   Atrial fibrillation ([place])    CAD (coronary artery disease) 1996   status post PCI of the RCA    Community acquired pneumonia 08/24/2015   Congestive heart failure, unspecified    COPD (chronic obstructive pulmonary disease) (HCC)    Pt on home O2 at night and PRN, unsure of date of diagnosis.   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HCC)    Nephrolithiasis    Other primary cardiomyopathies    Personal history of DVT (deep vein thrombosis)    Prostatitis    Shingles     Medications:  Medications Prior to Admission  Medication Sig Dispense Refill Last Dose   acetaminophen (TYLENOL) 500 MG tablet Take 1,000 mg by mouth every 6 (six) hours as needed for moderate pain.    PRN   albuterol (ACCUNEB) 1.25 MG/3ML nebulizer solution INHALE CONTENTS OF 1 VIAL IN NEBULIZER EVERY 6 HOURS AS NEEDED. (Patient taking differently: Take 1 ampule by nebulization every 6 (six) hours as needed for wheezing or shortness of breath.) 360 mL 5 Past Week   albuterol (VENTOLIN HFA) 108 (90  Base) MCG/ACT inhaler INHALE 1-2 PUFFS INTO THE LUNGS EVERY 6 HOURS AS NEEDED FOR WHEEZING OR SHORTNESS OF BREATH (Patient taking differently: Inhale 1-2 puffs into the lungs every 6 (six) hours as needed for shortness of breath or wheezing.) 8.5 g 5 Past Week   alfuzosin (UROXATRAL) 10 MG 24 hr tablet Take 10 mg by mouth daily.   07/05/2020   allopurinol (ZYLOPRIM) 100 MG tablet Take 1 tablet (100 mg total) daily by mouth. 90 tablet 0 07/05/2020   atorvastatin (LIPITOR) 10 MG tablet TAKE ONE TABLET BY MOUTH DAILY (Patient taking differently: Take 10 mg by mouth daily.) 30 tablet 11 07/04/2020   benzonatate (TESSALON) 100 MG capsule Take 1 capsule (100 mg total) by mouth every 6 (six) hours as needed for cough. 30 capsule 1 PRN   carvedilol (COREG) 12.5 MG tablet TAKE ONE TABLET BY MOUTH TWICE DAILY. TAKE WITH A MEAL. (Patient taking differently: Take 12.5 mg by mouth 2 (two) times daily with a meal.) 180 tablet 1 07/05/2020 at am   cetirizine (ZYRTEC) 10 MG tablet Take 10 mg by mouth daily.   07/05/2020   Cholecalciferol 25 MCG (1000 UT) tablet Take 1 tablet by mouth daily.   07/05/2020   digoxin (LANOXIN) 0.125 MG tablet TAKE 1/2 TABLET BY MOUTH DAILY (Patient taking differently: Take 0.0625 mg by mouth daily.) 45 tablet 2 07/05/2020  fluticasone (FLONASE) 50 MCG/ACT nasal spray Place 2 sprays into both nostrils at bedtime as needed for allergies. 16 g 3 Past Week   Fluticasone-Umeclidin-Vilant (TRELEGY ELLIPTA) 100-62.5-25 MCG/INH AEPB INHALE ONE PUFF INTO THE LUNGS DAILY (Patient taking differently: Inhale 1 puff into the lungs daily.) 60 each 1 07/05/2020   furosemide (LASIX) 40 MG tablet Take 2 tablets (80 mg total) by mouth 2 (two) times daily. 120 tablet 3 07/05/2020   potassium chloride SA (KLOR-CON) 20 MEQ tablet Take 1.5 tablets (30 mEq total) by mouth daily. 135 tablet 3 07/05/2020   vitamin C (ASCORBIC ACID) 500 MG tablet Take 1,000 mg by mouth every morning.   07/05/2020   warfarin (COUMADIN) 1 MG  tablet TAKE TWO (2) TABLETS BY MOUTH EVERY DAY OR USE AS DIRECTED (Patient taking differently: Take 2 mg by mouth daily.) 65 tablet 3 07/05/2020 at am   zinc gluconate 50 MG tablet Take 50 mg by mouth daily.   07/05/2020   doxycycline (VIBRA-TABS) 100 MG tablet Take 1 tablet (100 mg total) by mouth 2 (two) times daily. (Patient not taking: No sig reported) 14 tablet 0 Completed Course   predniSONE (STERAPRED UNI-PAK 21 TAB) 10 MG (21) TBPK tablet Take by mouth daily. Take 40mg  daily for 3 days, then 30mg  daily for 3 days, then 20mg  daily for 3 days, then 10mg  daily for 3 days, then stop (Patient not taking: No sig reported) 30 tablet 0 Completed Course    Assessment: Pharmacy consulted to dose warfarin in patient with atrial fibrillation.  INR on admission is supratherapeutic at 4.8.  Home dose listed as 1 mg on Wed and 2 mg ROW.  INR 1.9  Goal of Therapy:  INR 2-3 Monitor platelets by anticoagulation protocol: Yes   Plan:  Warfarin 2 mg x 1 dose. Monitor daily INR and s/s of bleeding.  Margot Ables, PharmD Clinical Pharmacist 07/10/2020 8:34 AM

## 2020-07-10 NOTE — Plan of Care (Addendum)
  Problem: Acute Rehab PT Goals(only PT should resolve) Goal: Pt Will Go Supine/Side To Sit Outcome: Progressing Flowsheets (Taken 07/10/2020 1214) Pt will go Supine/Side to Sit: Independently Goal: Patient Will Transfer Sit To/From Stand Outcome: Progressing Flowsheets (Taken 07/10/2020 1214) Patient will transfer sit to/from stand: with modified independence Goal: Pt Will Transfer Bed To Chair/Chair To Bed Outcome: Progressing Flowsheets (Taken 07/10/2020 1214) Pt will Transfer Bed to Chair/Chair to Bed: with modified independence Goal: Pt Will Ambulate Outcome: Progressing Flowsheets (Taken 07/10/2020 1214) Pt will Ambulate:  50 feet  with modified independence  with cane   12:15 PM, 07/10/20 Jeneen Rinks Cousler SPT  12:28 PM, 07/10/20 Lonell Grandchild, MPT Physical Therapist with [facility] 336 [phone] office [phone] mobile phone

## 2020-07-10 NOTE — Progress Notes (Addendum)
Progress Note  Patient Name: Troy [name] Date of Encounter: 07/10/2020  [facility] Cardiologist: Larae Grooms, MD   Subjective   " I don't feel great"   Breathing is improved  No CP   Inpatient Medications    Scheduled Meds:  alfuzosin  10 mg Oral Daily   allopurinol  100 mg Oral Daily   atorvastatin  10 mg Oral Daily   carvedilol  12.5 mg Oral BID WC   dextromethorphan-guaiFENesin  1 tablet Oral BID   digoxin  62.5 mcg Oral Daily   furosemide  80 mg Oral BID   loratadine  10 mg Oral Daily   sodium chloride flush  3 mL Intravenous Q12H   warfarin  2 mg Oral ONCE-1600   Warfarin - Pharmacist Dosing Inpatient   Does not apply q1600   Continuous Infusions:  sodium chloride     PRN Meds: sodium chloride, acetaminophen, fluticasone, guaiFENesin-dextromethorphan, ondansetron (ZOFRAN) IV, ondansetron (ZOFRAN) IV, sodium chloride flush   Vital Signs    Vitals:   07/09/20 1923 07/10/20 0552 07/10/20 0700 07/10/20 0841  BP: (!) 126/54 139/61  (!) 115/47  Pulse: 70 70  69  Resp: 18 18    Temp: 98.2 F (36.8 C) 98.6 F (37 C)    TempSrc: Oral Oral    SpO2: 100% 100%    Weight:   71.7 kg   Height:        Intake/Output Summary (Last 24 hours) at 07/10/2020 1225 Last data filed at 07/10/2020 0900 Gross per 24 hour  Intake 360 ml  Output 2250 ml  Net -1890 ml   Last 3 Weights 07/10/2020 07/09/2020 07/08/2020  Weight (lbs) 158 lb 1.1 oz 166 lb 0.1 oz 172 lb 2.9 oz  Weight (kg) 71.7 kg 75.3 kg 78.1 kg      Telemetry    SR  ECG    No new  Physical Exam  Neck:  JVP is increased Lungs  MIld rhonchi Cardiac exam   RRR  No S3  No signfi murmurs Ext  Triv edema L leg    Labs    High Sensitivity Troponin:   Recent Labs  Lab 07/05/20 1046 07/05/20 1256  TROPONINIHS 91* 88*      Chemistry Recent Labs  Lab 07/05/20 1046 07/06/20 0513 07/08/20 0656 07/09/20 0701 07/10/20 0708  NA 141   < > 143 143 141  K 3.6   < > 4.4 4.5 5.0  CL 93*   < >  93* 89* 88*  CO2 39*   < > 41* 47* 44*  GLUCOSE 150*   < > 121* 191* 115*  BUN 36*   < > 27* 31* 25*  CREATININE 1.25*   < > 1.00 1.21 0.99  CALCIUM 9.0   < > 8.3* 8.5* 8.9  PROT 6.2*  --   --   --   --   ALBUMIN 3.4*  --   --   --   --   AST 19  --   --   --   --   ALT 30  --   --   --   --   ALKPHOS 78  --   --   --   --   BILITOT 1.1  --   --   --   --   GFRNONAA 58*   < > >60 >60 >60  ANIONGAP 9   < > 9 7 9    < > = values in this  interval not displayed.     Hematology Recent Labs  Lab 07/07/20 0627 07/08/20 0656 07/09/20 0701  WBC 11.6* 12.0* 10.3  RBC 4.28 4.28 4.12*  HGB 13.7 13.9 13.1  HCT 44.6 44.3 43.2  MCV 104.2* 103.5* 104.9*  MCH 32.0 32.5 31.8  MCHC 30.7 31.4 30.3  RDW 16.5* 16.2* 16.4*  PLT 92* 83* 93*    BNP Recent Labs  Lab 07/05/20 1046  BNP 3,380.0*     DDimer  Recent Labs  Lab 07/05/20 1256  DDIMER 0.31     Radiology    No results found.  Cardiac Studies   Echo 07/07/20 IMPRESSIONS     1. Left ventricular ejection fraction, by estimation, is 20 to 25%. The  left ventricle has severely decreased function. The left ventricle  demonstrates global hypokinesis. There is mild left ventricular  hypertrophy. Left ventricular diastolic parameters   are indeterminate.   2. Right ventricular systolic function is normal. The right ventricular  size is normal.   3. Left atrial size was severely dilated.   4. Right atrial size was mild to moderately dilated.   5. The mitral valve is normal in structure. Mild mitral valve  regurgitation. No evidence of mitral stenosis.   6. The aortic valve is tricuspid. There is mild calcification of the  aortic valve. There is mild thickening of the aortic valve. Aortic valve  regurgitation is mild.   7. The inferior vena cava is normal in size with greater than 50%  respiratory variability, suggesting right atrial pressure of 3 mmHg.   Patient Profile     80 y.o. male with a hx of HFrEF (s/p BiV-ICD  in 2012, generator change 2020), NICM (EF 25-30%), PAF, chronic respiratory failure (on 3L O2) / COPD, CKD stage III, CAD (PCI of RCA 1996), prior lower GI bleed, GERD, gout, arthritis, and HL, who was admitted to the hospital with worsening dyspnea. He describes increasing lower extremity swelling and exertional SOB over the past few days. No chest pain. He recently tested positive for COVID 19 a few weeks ago. He was also admitted to [facility] from 06/20/2020 to 06/21/2020 for heart failure.   Assessment & Plan   Acute on chronic systolic HF - Jan 3710 echo: LVEF 25-30%, grade I dd - repeat echo LVEF 20-25% - presented with SOB, LE edema. BNP 3380, CXR vascular congestion, right pleural effusion   - he was on IV lasix 80mg  bid but switched to 80 mg po bid yest, neg 1.7L yesterday, neg 5.9 L since admission. Weights appear inaccurate. Mild uptrend in Cr though within prior ranges, normal today I would give additional IV lasix in am   Follow response and Cr   - on coreg 12.5mg  bid. Assume has not been on ACE/ARB/ARNI/aldactone as home regimen due to labile renal function. - bp's stable with diuresis Would add low dose hydralazine 10 mg tid and Imdur 30   Follow BP closely       2. PAF - has been on coreg and digoxin, coumadin for anticoag - from notes has seen EP, not elgible for antiarrhythmics given HF, renal disease, lung disease - supratherapeutic INR on admission, followed by pharmacy   3. History of CAD - no acute issues   4. COVID + - from admit note over 2 weeks out from testing +  Still on resp isolation   5 days from admit   5. COPD - chronically on home O2 3 L [place]  For questions or updates, please contact [place] Please consult [email or web address] for contact info under        Signed, Ermalinda Barrios, PA-C  07/10/2020, 12:25 PM    PT seen and examined   I have amended note above by Gerrianne Scale Pt has diuresed significantly   WOuld give one more IV dose in AM I would add  low dose hydralazine / nitrate to regimen and follow BP    Close to d/c from cardiac [place]      Dorris Carnes MD

## 2020-07-10 NOTE — Progress Notes (Signed)
PROGRESS NOTE    Troy [name]  TDV:[id number] DOB: 28-Dec-1940 DOA: 07/05/2020 PCP: Patient, No Pcp Per (Inactive)   Brief Narrative:   Troy [name] is a 80 y.o. male with medical history of NICM with EF 25-30%; CKD stage III, COPD, proximal atrial fibrillation, chronic respiratory failure on 3 L, impaired glucose tolerance, coronary artery disease, hyperlipidemia presenting with 2 to 3-day history of shortness of breath and worsening lower extremity edema.  He has been admitted with acute on chronic systolic congestive heart failure exacerbation and is undergoing diuresis.  He is COVID-19 test is noted to be positive and he is on isolation for this, but does not appear to require any treatment.  He is on his chronic 3 L nasal cannula oxygen.  Assessment & Plan:   Active Problems:   Biventricular implantable cardioverter-defibrillator in situ   Acute on chronic congestive heart failure (HCC)   Coronary artery disease involving native coronary artery of native heart without angina pectoris   Paroxysmal atrial fibrillation (HCC)   Controlled type 2 diabetes mellitus without complication, without long-term current use of insulin (HCC)   Mixed hyperlipidemia   Chronic respiratory failure with hypoxia (HCC)   Acute on chronic systolic CHF (congestive heart failure) (HCC)   Secondary hypercoagulable state ([place])   Acute on chronic systolic CHF exacerbation-improving -Hold further Lasix today due to some orthostasis.  Plan to bolus 250 mL normal saline x1.  Monitor symptoms. -Appreciate cardiology evaluation and recommendations in a.m. -2D echocardiogram showing LVEF 20-25% with severely decreased function; previously 25-30% -Strict I's and O's   COVID-19 infection -Does not appear to have any significant hypoxemia or pneumonia as a result -Procalcitonin and inflammatory markers low   History of paroxysmal atrial fibrillation -Continue to monitor on Coumadin -No active bleeding  noted -Continue Troy [name] for rate control, currently with paced rhythm   CKD stage IIIa-stable -Creatinine beginning to elevate with aggressive IV diuresis, switch to p.o. -Repeat a.m. 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 Crissie [name]   Acute on chronic hypoxemic respiratory failure -Normally wears 3 L nasal cannula at home currently at 5 L   CAD/elevated troponin/dyslipidemia -No chest pain and EKG with paced rhythm -Appreciate cardiology evaluation -Continue statin   Essential hypertension-stable -Continue carvedilol -Monitor closely with aggressive diuresis   COPD -No acute exacerbation noted -Continue bronchodilators -Mucolytic's and flutter valve, and up to chair given chest congestion     DVT prophylaxis: Coumadin Code Status: Full Family Communication: Niece on phone 6/20 Disposition Plan:  Status is: Inpatient   Remains inpatient appropriate because:IV treatments appropriate due to intensity of illness or inability to take PO   Dispo: The patient is from: Home              Anticipated d/c is to: SNF              Patient currently is not medically stable to d/c.              Difficult to place patient No   Consultants:  Cardiology   Procedures:  See below   Antimicrobials:  None  Subjective: Patient seen and evaluated today with feeling "sick to my stomach."  He is having some lightheadedness and dizziness with standing in particular.  Objective: Vitals:   07/09/20 1923 07/10/20 0552 07/10/20 0700 07/10/20 0841  BP: (!) 126/54 139/61  (!) 115/47  Pulse: 70 70  69  Resp: 18 18  Temp: 98.2 F (36.8 C) 98.6 F (37 C)    TempSrc: Oral Oral    SpO2: 100% 100%    Weight:   71.7 kg   Height:        Intake/Output Summary (Last 24 hours) at 07/10/2020 1308 Last data filed at 07/10/2020 0900 Gross per 24 hour  Intake 360 ml  Output 2250 ml  Net -1890 ml   Filed Weights   07/08/20 0545 07/09/20  0441 07/10/20 0700  Weight: 78.1 kg 75.3 kg 71.7 kg    Examination:  General exam: Appears calm and comfortable  Respiratory system: Clear to auscultation. Respiratory effort normal.  On 3 L nasal cannula oxygen. Cardiovascular system: S1 & S2 heard, RRR.  Gastrointestinal system: Abdomen is soft Central nervous system: Alert and awake Extremities: No edema Skin: No significant lesions noted Psychiatry: Flat affect.    Data Reviewed: I have personally reviewed following labs and imaging studies  CBC: Recent Labs  Lab 07/05/20 1256 07/07/20 0627 07/08/20 0656 07/09/20 0701  WBC 15.0* 11.6* 12.0* 10.3  HGB 14.6 13.7 13.9 13.1  HCT 46.8 44.6 44.3 43.2  MCV 103.8* 104.2* 103.5* 104.9*  PLT 98* 92* 83* 93*   Basic Metabolic Panel: Recent Labs  Lab 07/06/20 0513 07/07/20 0627 07/08/20 0656 07/09/20 0701 07/10/20 0708  NA 143 142 143 143 141  K 4.2 4.1 4.4 4.5 5.0  CL 96* 93* 93* 89* 88*  CO2 42* 43* 41* 47* 44*  GLUCOSE 112* 148* 121* 191* 115*  BUN 37* 34* 27* 31* 25*  CREATININE 1.36* 1.36* 1.00 1.21 0.99  CALCIUM 8.7* 8.5* 8.3* 8.5* 8.9  MG  --  2.1 2.0 2.1 1.9   GFR: Estimated Creatinine Clearance: 53.7 mL/min (by C-G formula based on SCr of 0.99 mg/dL). Liver Function Tests: Recent Labs  Lab 07/05/20 1046  AST 19  ALT 30  ALKPHOS 78  BILITOT 1.1  PROT 6.2*  ALBUMIN 3.4*   No results for input(s): LIPASE, AMYLASE in the last 168 hours. No results for input(s): AMMONIA in the last 168 hours. Coagulation Profile: Recent Labs  Lab 07/06/20 0513 07/07/20 0627 07/08/20 0656 07/09/20 0701 07/10/20 0708  INR 4.9* 5.0* 4.6* 3.1* 1.9*   Cardiac Enzymes: No results for input(s): CKTOTAL, CKMB, CKMBINDEX, TROPONINI in the last 168 hours. BNP (last 3 results) No results for input(s): PROBNP in the last 8760 hours. HbA1C: No results for input(s): HGBA1C in the last 72 hours. CBG: Recent Labs  Lab 07/07/20 2045  GLUCAP 199*   Lipid Profile: No  results for input(s): CHOL, HDL, LDLCALC, TRIG, CHOLHDL, LDLDIRECT in the last 72 hours. Thyroid Function Tests: No results for input(s): TSH, T4TOTAL, FREET4, T3FREE, THYROIDAB in the last 72 hours. Anemia Panel: No results for input(s): VITAMINB12, FOLATE, FERRITIN, TIBC, IRON, RETICCTPCT in the last 72 hours. Sepsis Labs: Recent Labs  Lab 07/05/20 1256  PROCALCITON <0.10    Recent Results (from the past 240 hour(s))  Resp Panel by RT-PCR (Flu A&B, Covid) Nasopharyngeal Swab     Status: Abnormal   Collection Time: 07/05/20 11:02 AM   Specimen: Nasopharyngeal Swab; Nasopharyngeal(NP) swabs in vial transport medium  Result Value Ref Range Status   SARS Coronavirus 2 by RT PCR POSITIVE (A) NEGATIVE Final    Comment: RESULT CALLED TO, READ BACK BY AND VERIFIED WITH: M. CUGINO 07/05/20 @1202  BY S. BEARD (NOTE) SARS-CoV-2 target nucleic acids are DETECTED.  The SARS-CoV-2 RNA is generally detectable in upper respiratory specimens during the acute phase  of infection. Positive results are indicative of the presence of the identified virus, but do not rule out bacterial infection or co-infection with other pathogens not detected by the test. Clinical correlation with patient history and other diagnostic information is necessary to determine patient infection status. The expected result is Negative.  Fact Sheet for Patients: EntrepreneurPulse.com.au  Fact Sheet for Healthcare Providers: IncredibleEmployment.be  This test is not yet approved or cleared by the [place] FDA and  has been authorized for detection and/or diagnosis of SARS-CoV-2 by FDA under an Emergency Use Authorization (EUA).  This EUA will remain in effect (meaning this test can  be used) for the duration of  the COVID-19 declaration under Section 564(b)(1) of the Act, 21 U.S.C. section 360bbb-3(b)(1), unless the authorization is terminated or revoked sooner.     Influenza A  by PCR NEGATIVE NEGATIVE Final   Influenza B by PCR NEGATIVE NEGATIVE Final    Comment: (NOTE) The Xpert Xpress SARS-CoV-2/FLU/RSV plus assay is intended as an aid in the diagnosis of influenza from Nasopharyngeal swab specimens and should not be used as a sole basis for treatment. Nasal washings and aspirates are unacceptable for Xpert Xpress SARS-CoV-2/FLU/RSV testing.  Fact Sheet for Patients: EntrepreneurPulse.com.au  Fact Sheet for Healthcare Providers: IncredibleEmployment.be  This test is not yet approved or cleared by the [place] FDA and has been authorized for detection and/or diagnosis of SARS-CoV-2 by FDA under an Emergency Use Authorization (EUA). This EUA will remain in effect (meaning this test can be used) for the duration of the COVID-19 declaration under Section 564(b)(1) of the Act, 21 U.S.C. section 360bbb-3(b)(1), unless the authorization is terminated or revoked.  Performed at [facility], [address], [place], [place] [address]          Radiology Studies: No results found.      Scheduled Meds:  alfuzosin  10 mg Oral Daily   allopurinol  100 mg Oral Daily   atorvastatin  10 mg Oral Daily   carvedilol  12.5 mg Oral BID WC   dextromethorphan-guaiFENesin  1 tablet Oral BID   digoxin  62.5 mcg Oral Daily   loratadine  10 mg Oral Daily   sodium chloride flush  3 mL Intravenous Q12H   warfarin  2 mg Oral ONCE-1600   Warfarin - Pharmacist Dosing Inpatient   Does not apply q1600   Continuous Infusions:  sodium chloride     sodium chloride       LOS: 5 days    Time spent: 35 minutes    Jolynn Bajorek Darleen Crocker, DO [facility]  If 7PM-7AM, please contact night-coverage [email or web address] 07/10/2020, 1:08 PM

## 2020-07-10 NOTE — Evaluation (Addendum)
Physical Therapy Evaluation Patient Details Name: Troy [name] MRN: [id number] DOB: Sep 06, 1940 Today's Date: 07/10/2020   History of Present Illness  Troy [name] is a 80 y.o. male with medical history of NICM with EF 25-30%; CKD stage III, COPD, proximal atrial fibrillation, chronic respiratory failure on 3 L, impaired glucose tolerance, coronary artery disease, hyperlipidemia presenting with 2 to 3-day history of shortness of breath and worsening lower extremity edema.  The patient states that he was recently admitted to [facility] for the same.  Review of the medical record shows that the patient was recently admitted to [facility] from 06/20/2020  to 06/21/2020 for acute on chronic systolic CHF.  He was discharged home on furosemide 40 mg twice daily.  However, according to the medical record he is usually on furosemide 80 mg twice daily.  Nevertheless, the patient states that he tested positive for COVID approximately 3 to 4 days prior to the hospitalization at [facility].  He has been vaccinated and boosted.  He denies any fevers, chills, headache, sore throat, nausea, vomiting or diarrhea, abdominal pain, dysuria, hematuria.  He has a nonproductive cough which has been chronic.  He endorses compliance with his medications.   Clinical Impression  Patient presents in chair alert and oriented and agreeable to therapy.  Patient was on 3/5 [place] Lpm or O2 with SpO2 at 95%. Orthostatics lying down was 115/55, sitting 116/49 and standing for 96/54. Patient did not report and dizziness or fatigue with changes in position. Patient was modified independent for bed mobility with increase time. Patient was able to transfer to EOB with supervision, and able to ambulate for 20 feet with single point cane with a noted sense of unsteadiness and limited due to fatigue and generalized weakness. Patient demonstrated good return to the chair after therapy. Patient will benefit from continued physical therapy in hospital and  recommended venue below to increase strength, balance, endurance for safe ADLs and gait.     Follow Up Recommendations SNF    Equipment Recommendations  None recommended by PT    Recommendations for Other Services       Precautions / Restrictions Precautions Precautions: Sternal Restrictions Weight Bearing Restrictions: No      Mobility  Bed Mobility Overal bed mobility: Modified Independent             General bed mobility comments: used bed rails and momentum to sit up Patient Response: Cooperative  Transfers Overall transfer level: Modified independent Equipment used: Straight cane             General transfer comment: Patient is slow and unsteady on his feet  Ambulation/Gait Ambulation/Gait assistance: Modified independent (Device/Increase time);Supervision Gait Distance (Feet): 20 Feet Assistive device: Straight cane Gait Pattern/deviations: Step-through pattern;Decreased step length - left;Decreased stance time - right;Narrow base of support;Decreased stride length Gait velocity: decreased   General Gait Details: slow cadence, increased time and reports feeling weak and unsteady on his feet, on [place] O2  Stairs            Wheelchair Mobility    Modified Rankin (Stroke Patients Only)       Balance Overall balance assessment: Modified Independent;Needs assistance Sitting-balance support: Bilateral upper extremity supported;Feet supported Sitting balance-[name]: Good Sitting balance - Comments: at EOB   Standing balance support: Single extremity supported;During functional activity Standing balance-[name]: Fair Standing balance comment: fair but unsteady  Pertinent Vitals/Pain Pain Assessment: No/denies pain    Home Living Family/patient expects to be discharged to:: Private residence Living Arrangements: Alone   Type of Home: Apartment Home Access: Elevator     Home Layout: Two  level;Able to live on main level with bedroom/bathroom Home Equipment: Gilford Rile - 2 wheels;Cane - single point      Prior Function Level of Independence: Independent with assistive device(s)         Comments: patient is a Hydrographic surveyor with O2 and AD     Hand Dominance        Extremity/Trunk Assessment   Upper Extremity Assessment Upper Extremity Assessment: Generalized weakness    Lower Extremity Assessment Lower Extremity Assessment: Generalized weakness    Cervical / Trunk Assessment Cervical / Trunk Assessment: Kyphotic  Communication   Communication: No difficulties  Cognition Arousal/Alertness: Awake/alert Behavior During Therapy: WFL for tasks assessed/performed Overall Cognitive Status: Within Functional Limits for tasks assessed                                        General Comments      Exercises     Assessment/Plan    PT Assessment Patient needs continued PT services  PT Problem List Decreased strength;Decreased activity tolerance;Decreased balance;Decreased mobility;Decreased coordination       PT Treatment Interventions DME instruction;Gait training;Stair training;Functional mobility training;Therapeutic activities;Therapeutic exercise;Balance training    PT Goals (Current goals can be found in the Care Plan section)  Acute Rehab PT Goals Patient Stated Goal: return home PT Goal Formulation: With patient Time For Goal Achievement: 07/24/20 Potential to Achieve Goals: Good    Frequency Min 3X/week   Barriers to discharge        Co-evaluation               AM-PAC PT "6 Clicks" Mobility  Outcome Measure Help needed turning from your back to your side while in a flat bed without using bedrails?: None Help needed moving from lying on your back to sitting on the side of a flat bed without using bedrails?: None Help needed moving to and from a bed to a chair (including a wheelchair)?: A Little Help needed  standing up from a chair using your arms (e.g., wheelchair or bedside chair)?: A Little Help needed to walk in hospital room?: A Lot Help needed climbing 3-5 steps with a railing? : A Lot 6 Click Score: 18    End of Session   Activity Tolerance: Patient tolerated treatment well;No increased pain;Patient limited by fatigue Patient left: in chair;with call bell/phone within reach;with chair alarm set Nurse Communication: Mobility status PT Visit Diagnosis: Unsteadiness on feet (R26.81);Other abnormalities of gait and mobility (R26.89);Muscle weakness (generalized) (M62.81)    Time: 3546-5681 PT Time Calculation (min) (ACUTE ONLY): 29 min   Charges:   PT Evaluation $PT Eval Moderate Complexity: 1 Mod PT Treatments $Therapeutic Activity: 23-37 mins       12:30 PM, 07/10/20 Jeneen Rinks Cousler SPT  12:30 PM, 07/10/20 Lonell Grandchild, MPT Physical Therapist with [facility] [phone] office [phone] mobile phone

## 2020-07-10 NOTE — Progress Notes (Signed)
Telemetry called patient had a 5 beat run of V-tach now pacing in the 70s MD Manuella Ghazi made aware.

## 2020-07-10 NOTE — NC FL2 (Signed)
[place] LEVEL OF CARE SCREENING TOOL     IDENTIFICATION  Patient Name: Troy [name] Birthdate: 06-20-1940 Sex: male Admission Date (Current Location): 07/05/2020  [facility] and [place] Number:  [employer] and Address:  [place] [address], [place]      Provider Number: [id number]  Attending Physician Name and Address:  Rodena Goldmann, DO  Relative Name and Phone Number:  artemis loyal  - Niece [phone]    Current Level of Care: Hospital Recommended Level of Care: [place] Prior Approval Number:    Date Approved/Denied:   PASRR Number: [id number]  Discharge Plan: SNF    Current Diagnoses: Patient Active Problem List   Diagnosis Date Noted   COVID-19    Secondary hypercoagulable state ([place]) 11/05/2019   Chronic respiratory failure with hypoxia ([place]) 01/31/2017   Acute on chronic systolic CHF (congestive heart failure) ([place]) 01/31/2017   Allergic rhinitis 12/05/2016   Hoarseness 11/04/2016   Controlled type 2 diabetes mellitus without complication, without long-term current use of insulin ([place]) 09/19/2016   High risk medications (not anticoagulants) long-term use 09/19/2016   History of ASCVD 09/19/2016   Mixed hyperlipidemia 09/19/2016   Sudden visual loss of left eye 09/19/2016   Lower GI bleed 09/11/2015   Bright red blood per rectum 09/11/2015   BPH (benign prostatic hyperplasia) 12/25/2014   FTT (failure to thrive) in adult 12/25/2014   Encounter for therapeutic drug monitoring 08/05/2014   Pulmonary nodules 12/23/2013   Hemoptysis    Dyspnea    Acute on chronic congestive heart failure ([place]) 11/21/2013   Chronic kidney disease, stage 3 (HCC)    Coronary artery disease involving native coronary artery of native heart without angina pectoris    Paroxysmal atrial fibrillation (HCC)    Frequent PVCs    Hypoxia    COPD (chronic obstructive pulmonary disease) ([place]) 06/16/2013    Lipoma of neck 09/11/2011   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   Chronic atrial fibrillation ([place]) 08/65/7846   Chronic systolic CHF (congestive heart failure) (HCC)    Cardiomyopathy, primary ([place])    Diverticulosis of colon with hemorrhage 08/28/2010    Orientation RESPIRATION BLADDER Height & Weight     Self, Time, Situation, Place  O2 (3 L) Continent Weight: 71.7 kg Height:  5\' 6"  (167.6 cm)  BEHAVIORAL SYMPTOMS/MOOD NEUROLOGICAL BOWEL NUTRITION STATUS      Continent Diet (See DC Summary)  AMBULATORY STATUS COMMUNICATION OF NEEDS Skin   Extensive Assist Verbally Bruising                       Personal Care Assistance Level of Assistance  Bathing, Feeding, Dressing Bathing Assistance: Maximum assistance Feeding assistance: Independent Dressing Assistance: Maximum assistance     Functional Limitations Info  Sight, Hearing, Speech Sight Info: Adequate Hearing Info: Impaired Speech Info: Adequate    SPECIAL CARE FACTORS FREQUENCY  PT (By licensed PT)     PT Frequency: 5 Times a week              Contractures Contractures Info: Not present    Additional Factors Info  Code Status, Allergies Code Status Info: Full Allergies Info: Benadryl           Current Medications (07/10/2020):  This is the current hospital active medication list Current Facility-Administered Medications  Medication Dose Route Frequency Provider Last Rate Last Admin   0.9 %  sodium chloride infusion  250 mL Intravenous PRN Tat, Shanon Brow, MD       acetaminophen (TYLENOL) tablet 1,000 mg  1,000 mg Oral Q6H PRN Tat, Shanon Brow, MD   1,000 mg at 07/10/20 8891   alfuzosin (UROXATRAL) 24 hr tablet 10 mg  10 mg Oral Daily Tat, Shanon Brow, MD   10 mg at 07/10/20 0841   allopurinol (ZYLOPRIM) tablet 100 mg  100 mg Oral Daily Tat, Shanon Brow, MD   100 mg at 07/10/20 0841   atorvastatin (LIPITOR) tablet 10 mg  10 mg Oral Daily Tat, Shanon Brow, MD   10 mg at 07/10/20 0841    carvedilol (COREG) tablet 12.5 mg  12.5 mg Oral BID WC Orson Eva, MD   12.5 mg at 07/10/20 0845   dextromethorphan-guaiFENesin ([place]orf DM) 30-600 MG per 12 hr tablet 1 tablet  1 tablet Oral BID Manuella Ghazi, Pratik D, DO   1 tablet at 07/10/20 0840   digoxin (LANOXIN) tablet 62.5 mcg  62.5 mcg Oral Daily Tat, Shanon Brow, MD   62.5 mcg at 07/10/20 0841   fluticasone (FLONASE) 50 MCG/ACT nasal spray 2 spray  2 spray Each Nare QHS PRN Tat, Shanon Brow, MD       guaiFENesin-dextromethorphan (ROBITUSSIN DM) 100-10 MG/5ML syrup 15 mL  15 mL Oral Q4H PRN Arnoldo Lenis, MD   15 mL at 07/07/20 2125   loratadine (CLARITIN) tablet 10 mg  10 mg Oral Daily Tat, Shanon Brow, MD   10 mg at 07/10/20 0841   ondansetron (ZOFRAN) injection 4 mg  4 mg Intravenous Q6H PRN Tat, Shanon Brow, MD       ondansetron [facility]) injection 4 mg  4 mg Intravenous Q6H PRN Tat, Shanon Brow, MD   4 mg at 07/10/20 0859   sodium chloride 0.9 % bolus 250 mL  250 mL Intravenous Once Manuella Ghazi, Pratik D, DO       sodium chloride flush (NS) 0.9 % injection 3 mL  3 mL Intravenous Q12H TatShanon Brow, MD   3 mL at 07/10/20 0848   sodium chloride flush (NS) 0.9 % injection 3 mL  3 mL Intravenous PRN Tat, Shanon Brow, MD       warfarin (COUMADIN) tablet 2 mg  2 mg Oral ONCE-1600 Manuella Ghazi, Pratik D, DO       Warfarin - Pharmacist Dosing Inpatient   Does not apply q1600 Heath Lark D, DO         Discharge Medications: Please see discharge summary for a list of discharge medications.  Relevant Imaging Results:  Relevant Lab Results:   Additional Information SS# [id number]  Boneta Lucks, RN

## 2020-07-11 ENCOUNTER — Telehealth: Payer: Self-pay | Admitting: Interventional Cardiology

## 2020-07-11 DIAGNOSIS — I5043 Acute on chronic combined systolic (congestive) and diastolic (congestive) heart failure: Secondary | ICD-10-CM | POA: Diagnosis not present

## 2020-07-11 DIAGNOSIS — I5023 Acute on chronic systolic (congestive) heart failure: Secondary | ICD-10-CM | POA: Diagnosis not present

## 2020-07-11 LAB — BASIC METABOLIC PANEL
Anion gap: 10 (ref 5–15)
BUN: 29 mg/dL — ABNORMAL HIGH (ref 8–23)
CO2: 42 mmol/L — ABNORMAL HIGH (ref 22–32)
Calcium: 8.6 mg/dL — ABNORMAL LOW (ref 8.9–10.3)
Chloride: 87 mmol/L — ABNORMAL LOW (ref 98–111)
Creatinine, Ser: 1.23 mg/dL (ref 0.61–1.24)
GFR, Estimated: 59 mL/min — ABNORMAL LOW (ref >60)
Glucose, Bld: 91 mg/dL (ref 70–99)
Potassium: 4.7 mmol/L (ref 3.5–5.1)
Sodium: 139 mmol/L (ref 135–145)

## 2020-07-11 LAB — BRAIN NATRIURETIC PEPTIDE: B Natriuretic Peptide: 517 pg/mL — ABNORMAL HIGH (ref 0.0–100.0)

## 2020-07-11 LAB — PROTIME-INR
INR: 1.6 — ABNORMAL HIGH (ref 0.8–1.2)
Prothrombin Time: 19.4 seconds — ABNORMAL HIGH (ref 11.4–15.2)

## 2020-07-11 MED ORDER — WARFARIN SODIUM 1 MG PO TABS: 2.0000 mg | ORAL_TABLET | Freq: Once | ORAL | Status: DC

## 2020-07-11 MED ORDER — TORSEMIDE 40 MG PO TABS: 40.0000 mg | ORAL_TABLET | Freq: Two times a day (BID) | ORAL | 2 refills | Status: DC

## 2020-07-11 MED ORDER — FUROSEMIDE 10 MG/ML IJ SOLN
80.0000 mg | Freq: Once | INTRAMUSCULAR | Status: AC
  Administered 2020-07-11: 80 mg via INTRAVENOUS
  Filled 2020-07-11: qty 8

## 2020-07-11 MED ORDER — GUAIFENESIN-DM 100-10 MG/5ML PO SYRP: 15.0000 mL | ORAL_SOLUTION | ORAL | 0 refills | Status: AC | PRN

## 2020-07-11 MED ORDER — TORSEMIDE 20 MG PO TABS: 40.0000 mg | ORAL_TABLET | Freq: Two times a day (BID) | ORAL | Status: DC

## 2020-07-11 NOTE — Telephone Encounter (Signed)
I spoke with Santiago Glad and let her know Dr Irish Lack would sign orders

## 2020-07-11 NOTE — Progress Notes (Signed)
Nsg Discharge Note  Admit Date:  07/05/2020 Discharge date: 07/11/2020   Lupita Dawn to be D/C'd Home per MD order.  AVS completed.  Copy for chart, and copy for patient signed, and dated. Patient/caregiver able to verbalize understanding. PT borrowed a O2 tank and niece is supposed to bring it back. AC approved it.  Discharge Medication: Allergies as of 07/11/2020       Reactions   Benadryl [diphenhydramine Hcl] Nausea And Vomiting        Medication List     STOP taking these medications    doxycycline 100 MG tablet Commonly known as: VIBRA-TABS   furosemide 40 MG tablet Commonly known as: LASIX   predniSONE 10 MG (21) Tbpk tablet Commonly known as: STERAPRED UNI-PAK 21 TAB       TAKE these medications    acetaminophen 500 MG tablet Commonly known as: TYLENOL Take 1,000 mg by mouth every 6 (six) hours as needed for moderate pain.   albuterol 1.25 MG/3ML nebulizer solution Commonly known as: ACCUNEB INHALE CONTENTS OF 1 VIAL IN NEBULIZER EVERY 6 HOURS AS NEEDED. What changed: See the new instructions.   albuterol 108 (90 Base) MCG/ACT inhaler Commonly known as: VENTOLIN HFA INHALE 1-2 PUFFS INTO THE LUNGS EVERY 6 HOURS AS NEEDED FOR WHEEZING OR SHORTNESS OF BREATH What changed: See the new instructions.   alfuzosin 10 MG 24 hr tablet Commonly known as: UROXATRAL Take 10 mg by mouth daily.   allopurinol 100 MG tablet Commonly known as: ZYLOPRIM Take 1 tablet (100 mg total) daily by mouth.   atorvastatin 10 MG tablet Commonly known as: LIPITOR TAKE ONE TABLET BY MOUTH DAILY   benzonatate 100 MG capsule Commonly known as: TESSALON Take 1 capsule (100 mg total) by mouth every 6 (six) hours as needed for cough.   carvedilol 12.5 MG tablet Commonly known as: COREG TAKE ONE TABLET BY MOUTH TWICE DAILY. TAKE WITH A MEAL. What changed: See the new instructions.   cetirizine 10 MG tablet Commonly known as: ZYRTEC Take 10 mg by mouth daily.    Cholecalciferol 25 MCG (1000 UT) tablet Take 1 tablet by mouth daily.   digoxin 0.125 MG tablet Commonly known as: LANOXIN TAKE 1/2 TABLET BY MOUTH DAILY   fluticasone 50 MCG/ACT nasal spray Commonly known as: FLONASE Place 2 sprays into both nostrils at bedtime as needed for allergies.   guaiFENesin-dextromethorphan 100-10 MG/5ML syrup Commonly known as: ROBITUSSIN DM Take 15 mLs by mouth every 4 (four) hours as needed for cough.   potassium chloride SA 20 MEQ tablet Commonly known as: KLOR-CON Take 1.5 tablets (30 mEq total) by mouth daily.   Torsemide 40 MG Tabs Take 40 mg by mouth 2 (two) times daily. Start taking on: July 12, 2020   Trelegy Ellipta 100-62.5-25 MCG/INH Aepb Generic drug: Fluticasone-Umeclidin-Vilant INHALE ONE PUFF INTO THE LUNGS DAILY What changed:  how much to take how to take this when to take this additional instructions   vitamin C 500 MG tablet Commonly known as: ASCORBIC ACID Take 1,000 mg by mouth every morning.   warfarin 1 MG tablet Commonly known as: COUMADIN Take as directed. If you are unsure how to take this medication, talk to your nurse or doctor. Original instructions: TAKE TWO (2) TABLETS BY MOUTH EVERY DAY OR USE AS DIRECTED What changed: See the new instructions.   zinc gluconate 50 MG tablet Take 50 mg by mouth daily.        Discharge Assessment: Vitals:  07/11/20 0821 07/11/20 0822  BP: (!) 124/53   Pulse: 70 70  Resp: 20   Temp:    SpO2:     Skin clean, dry and intact without evidence of skin break down, no evidence of skin tears noted. IV catheter discontinued intact. Site without signs and symptoms of complications - no redness or edema noted at insertion site, patient denies c/o pain - only slight tenderness at site.  Dressing with slight pressure applied.  D/c Instructions-Education: Discharge instructions given to patient/family with verbalized understanding. D/c education completed with patient/family  including follow up instructions, medication list, d/c activities limitations if indicated, with other d/c instructions as indicated by MD - patient able to verbalize understanding, all questions fully answered. Patient instructed to return to ED, call 911, or call MD for any changes in condition.  Patient escorted via [place], and D/C home via private auto.  Clovis Fredrickson, LPN 3/47/4259 5:63 PM

## 2020-07-11 NOTE — TOC Transition Note (Addendum)
Transition of Care [facility]) - CM/SW Discharge Note   Patient Details  Name: Troy [name] MRN: [id number] Date of Birth: January 24, 1940  [facility] CM/SW Contact:  Boneta Lucks, RN Phone Number: 07/11/2020, 10:57 AM   Clinical Narrative:   Patient is discharging home today with home health. Lattie Haw with [facility] accepted the referral for PT/SW. TOC updated his niece, Butch Penny. She is on her way to transport him home. She requested SW to have LTC conversation and option with him.  Addendum:  RN called Patient home oxygen is empty. Butch Penny- niece can take him home now or not until tomorrow. EMS is short staffed, we can not get EMS transport today. TOC got approval with [facility] Tawnya Crook to sent patient home with oxygen tank from 300 with niece now. Butch Penny is a retired [employer] said she would bring the tank back.   Final next level of care: [place] Barriers to Discharge: Barriers Resolved   Patient Goals and CMS Choice Patient states their goals for this hospitalization and ongoing recovery are:: to go home. CMS Medicare.gov Compare Post Acute Care list provided to:: Patient Choice offered to / list presented to : Patient  Discharge Placement             Patient [facility] bed at:  [facility])   Name of family member notified: Niece - Butch Penny Patient and family notified of of transfer: 07/11/20  Discharge Plan and Services      HH Arranged: PT, Social Work   Date [place]: 07/10/20 Time [place]: 1600 Representative spoke with at [place]: Lattie Haw   Readmission Risk Interventions Readmission Risk Prevention Plan 07/11/2020 07/06/2020  Medication Screening - Complete  Transportation Screening - Complete  PCP or Specialist Appt within 5-7 Days Complete -  Home Care Screening Complete -  Medication Review (RN CM) Complete -  Some recent data might be hidden

## 2020-07-11 NOTE — Discharge Summary (Signed)
Physician Discharge Summary  Troy [name] WNU:[id number] DOB: [birth year] DOA: 07/05/2020  PCP: Patient, No Pcp Per (Inactive)  Admit date: 07/05/2020  Discharge date: 07/11/2020  Admitted From:Home  Disposition:  Home  Recommendations for Outpatient Follow-up:  Follow up with PCP in 1-2 weeks Follow-up with cardiology outpatient which will be arranged Continue now on torsemide 40 mg twice daily instead of Lasix previously  Home Health: Yes with PT/RN/SW  Equipment/Devices: Has home 3 L nasal cannula  Discharge Condition:Stable  CODE STATUS: Full  Diet recommendation: Heart Healthy  Brief/Interim Summary: Troy [name] is a 80 y.o. male with medical history of NICM with EF 25-30%; CKD stage III, COPD, proximal atrial fibrillation, chronic respiratory failure on 3 L, impaired glucose tolerance, coronary artery disease, hyperlipidemia presented with 2 to 3-day history of shortness of breath and worsening lower extremity edema.  He had been admitted with acute on chronic systolic congestive heart failure exacerbation and has diuresed quite well.  He appears to have been over diuresed and was orthostatic at one point and required fluid bolus to assist him with his symptoms of dizziness especially upon standing.  He is no longer orthostatic and has been seen by cardiology with recommendations to start on torsemide 40 mg at home twice daily.  He will be seen in the cardiology office outpatient in the near future.  He continues to remain on his usual home 3 L nasal cannula oxygen and will be set up with home health physical therapy.  He was offered an option for SNF on discharge based on PT evaluation, but because of his COVID positive testing he was limited in his options and had refused the facility that was offered.  Discharge Diagnoses:  Active Problems:   Biventricular implantable cardioverter-defibrillator in situ   Acute on chronic congestive heart failure (HCC)   Coronary  artery disease involving native coronary artery of native heart without angina pectoris   Paroxysmal atrial fibrillation (HCC)   Controlled type 2 diabetes mellitus without complication, without long-term current use of insulin (HCC)   Mixed hyperlipidemia   Chronic respiratory failure with hypoxia (HCC)   Acute on chronic systolic CHF (congestive heart failure) ([place])   Secondary hypercoagulable state ([place])  Principal discharge diagnosis: Acute on chronic systolic CHF exacerbation. COVID-19 infection.  Discharge Instructions  Discharge Instructions     Diet - low sodium heart healthy   Complete by: As directed    Increase activity slowly   Complete by: As directed       Allergies as of 07/11/2020       Reactions   Benadryl [diphenhydramine Hcl] Nausea And Vomiting        Medication List     STOP taking these medications    doxycycline 100 MG tablet Commonly known as: VIBRA-TABS   furosemide 40 MG tablet Commonly known as: LASIX   predniSONE 10 MG (21) Tbpk tablet Commonly known as: STERAPRED UNI-PAK 21 TAB       TAKE these medications    acetaminophen 500 MG tablet Commonly known as: TYLENOL Take 1,000 mg by mouth every 6 (six) hours as needed for moderate pain.   albuterol 1.25 MG/3ML nebulizer solution Commonly known as: ACCUNEB INHALE CONTENTS OF 1 VIAL IN NEBULIZER EVERY 6 HOURS AS NEEDED. What changed: See the new instructions.   albuterol 108 (90 Base) MCG/ACT inhaler Commonly known as: VENTOLIN HFA INHALE 1-2 PUFFS INTO THE LUNGS EVERY 6 HOURS AS NEEDED FOR WHEEZING OR SHORTNESS OF BREATH What changed:  See the new instructions.   alfuzosin 10 MG 24 hr tablet Commonly known as: UROXATRAL Take 10 mg by mouth daily.   allopurinol 100 MG tablet Commonly known as: ZYLOPRIM Take 1 tablet (100 mg total) daily by mouth.   atorvastatin 10 MG tablet Commonly known as: LIPITOR TAKE ONE TABLET BY MOUTH DAILY   benzonatate 100 MG capsule Commonly  known as: TESSALON Take 1 capsule (100 mg total) by mouth every 6 (six) hours as needed for cough.   carvedilol 12.5 MG tablet Commonly known as: COREG TAKE ONE TABLET BY MOUTH TWICE DAILY. TAKE WITH A MEAL. What changed: See the new instructions.   cetirizine 10 MG tablet Commonly known as: ZYRTEC Take 10 mg by mouth daily.   Cholecalciferol 25 MCG (1000 UT) tablet Take 1 tablet by mouth daily.   digoxin 0.125 MG tablet Commonly known as: LANOXIN TAKE 1/2 TABLET BY MOUTH DAILY   fluticasone 50 MCG/ACT nasal spray Commonly known as: FLONASE Place 2 sprays into both nostrils at bedtime as needed for allergies.   guaiFENesin-dextromethorphan 100-10 MG/5ML syrup Commonly known as: ROBITUSSIN DM Take 15 mLs by mouth every 4 (four) hours as needed for cough.   potassium chloride SA 20 MEQ tablet Commonly known as: KLOR-CON Take 1.5 tablets (30 mEq total) by mouth daily.   Torsemide 40 MG Tabs Take 40 mg by mouth 2 (two) times daily. Start taking on: July 12, 2020   Trelegy Ellipta 100-62.5-25 MCG/INH Aepb Generic drug: Fluticasone-Umeclidin-Vilant INHALE ONE PUFF INTO THE LUNGS DAILY What changed:  how much to take how to take this when to take this additional instructions   vitamin C 500 MG tablet Commonly known as: ASCORBIC ACID Take 1,000 mg by mouth every morning.   warfarin 1 MG tablet Commonly known as: COUMADIN Take as directed. If you are unsure how to take this medication, talk to your nurse or doctor. Original instructions: TAKE TWO (2) TABLETS BY MOUTH EVERY DAY OR USE AS DIRECTED What changed: See the new instructions.   zinc gluconate 50 MG tablet Take 50 mg by mouth daily.        Follow-up Information     Windell, Musson, PA-C Follow up on 08/04/2020.   Specialties: Physician Assistant, Cardiology Why: Cardiology Hospital Follow-up on 08/04/2020 at 3:30 PM. Will be at the [facility] due to availability. Contact information: [place] [address] [phone]         [facility] Follow up.   Why: PT/ SW               Allergies  Allergen Reactions   Benadryl [Diphenhydramine Hcl] Nausea And Vomiting    Consultations: Cardiology   Procedures/Studies: DG Chest Port 1 View  Result Date: 07/07/2020 CLINICAL DATA:  Shortness of breath EXAM: PORTABLE CHEST 1 VIEW COMPARISON:  07/05/2020 FINDINGS: Persistent right greater than left pleural effusions with bibasilar atelectasis. Possible mild interstitial edema superimposed on chronic interstitial changes. Stable cardiomegaly. Left chest wall ICD. IMPRESSION: No substantial change. Persistent right greater than left pleural effusions with bibasilar atelectasis. Possible mild interstitial edema superimposed on chronic interstitial changes. Electronically Signed   By: Macy Mis M.D.   On: 07/07/2020 16:25   DG Chest Port 1 View  Result Date: 07/05/2020 CLINICAL DATA:  Shortness of breath and leg swelling EXAM: PORTABLE CHEST 1 VIEW COMPARISON:  06/20/2020 FINDINGS: Cardiac shadow is enlarged but stable. Defibrillator is again noted. Aortic calcifications are seen. Vascular congestion has  improved somewhat in the interval from the prior exam. Increasing right-sided pleural effusion is noted with underlying basilar infiltrate. IMPRESSION: Increasing right-sided effusion with underlying right basilar infiltrate. Previously seen vascular congestion has improved in the interval from the prior exam. Electronically Signed   By: Inez Catalina M.D.   On: 07/05/2020 11:23   ECHOCARDIOGRAM COMPLETE  Result Date: 07/07/2020    ECHOCARDIOGRAM REPORT   Patient Name:   Troy [name] Date of Exam: 07/07/2020 Medical Rec #:  [id number]         Height:       66.0 in Accession #:    [id number]        Weight:       179.5 lb Date of Birth:  01/11/1941        BSA:          1.910 m Patient Age:    31 years          BP:           141/76 mmHg Patient Gender: M                  HR:           70 bpm. Exam Location:  [facility] Procedure: 2D Echo, Cardiac Doppler and Color Doppler Indications:    CHF  History:        Patient has prior history of Echocardiogram examinations, most                 recent 02/01/2017. CHF and Cardiomyopathy, CAD, Defibrillator,                 COPD, Arrythmias:Atrial Fibrillation, Signs/Symptoms:Shortness                 of Breath; Risk Factors:Dyslipidemia and LE edema. COVID+.  Sonographer:    Dustin Flock RDCS Referring Phys: [phone] DAVID TAT  Sonographer Comments: Image acquisition challenging due to COPD and Image acquisition challenging due to respiratory motion. IMPRESSIONS  1. Left ventricular ejection fraction, by estimation, is 20 to 25%. The left ventricle has severely decreased function. The left ventricle demonstrates global hypokinesis. There is mild left ventricular hypertrophy. Left ventricular diastolic parameters  are indeterminate.  2. Right ventricular systolic function is normal. The right ventricular size is normal.  3. Left atrial size was severely dilated.  4. Right atrial size was mild to moderately dilated.  5. The mitral valve is normal in structure. Mild mitral valve regurgitation. No evidence of mitral stenosis.  6. The aortic valve is tricuspid. There is mild calcification of the aortic valve. There is mild thickening of the aortic valve. Aortic valve regurgitation is mild.  7. The inferior vena cava is normal in size with greater than 50% respiratory variability, suggesting right atrial pressure of 3 mmHg. FINDINGS  Left Ventricle: Left ventricular ejection fraction, by estimation, is 20 to 25%. The left ventricle has severely decreased function. The left ventricle demonstrates global hypokinesis. The left ventricular internal cavity size was normal in size. There is mild left ventricular hypertrophy. Left ventricular diastolic parameters are indeterminate. Right Ventricle: The right ventricular size is normal. No  increase in right ventricular wall thickness. Right ventricular systolic function is normal. Left Atrium: Left atrial size was severely dilated. Right Atrium: Right atrial size was mild to moderately dilated. Pericardium: There is no evidence of pericardial effusion. Mitral Valve: The mitral valve is normal in structure. There is mild thickening of the mitral valve leaflet(s). There is  mild calcification of the mitral valve leaflet(s). Mild mitral annular calcification. Mild mitral valve regurgitation. No evidence of  mitral valve stenosis. Tricuspid Valve: The tricuspid valve is not well visualized. Tricuspid valve regurgitation is mild . No evidence of tricuspid stenosis. Aortic Valve: The aortic valve is tricuspid. There is mild calcification of the aortic valve. There is mild thickening of the aortic valve. There is mild aortic valve annular calcification. Aortic valve regurgitation is mild. Aortic regurgitation PHT measures 574 msec. Aortic valve mean gradient measures 4.5 mmHg. Aortic valve peak gradient measures 7.9 mmHg. Aortic valve area, by VTI measures 3.68 cm. Pulmonic Valve: The pulmonic valve was not well visualized. Pulmonic valve regurgitation is not visualized. No evidence of pulmonic stenosis. Aorta: The aortic root is normal in size and structure. Pulmonary Artery: Indeterminant PASP, inadequate TR jet. Venous: The inferior vena cava is normal in size with greater than 50% respiratory variability, suggesting right atrial pressure of 3 mmHg. IAS/Shunts: The interatrial septum was not well visualized. Additional Comments: A device lead is visualized.  LEFT VENTRICLE PLAX 2D LVIDd:         5.81 cm      Diastology LVIDs:         4.93 cm      LV e' medial:    4.03 cm/s LV PW:         1.20 cm      LV E/e' medial:  20.5 LV IVS:        1.24 cm      LV e' lateral:   4.03 cm/s LVOT diam:     2.70 cm      LV E/e' lateral: 20.5 LV SV:         97 LV SV Index:   51 LVOT Area:     5.73 cm  LV Volumes (MOD) LV  vol d, MOD A4C: 193.0 ml LV vol s, MOD A4C: 132.0 ml LV SV MOD A4C:     193.0 ml RIGHT VENTRICLE RV Basal diam:  3.74 cm RV S prime:     12.20 cm/s LEFT ATRIUM             Index       RIGHT ATRIUM           Index LA diam:        4.60 cm 2.41 cm/m  RA Area:     21.40 cm LA Vol (A2C):   74.3 ml 38.91 ml/m RA Volume:   68.30 ml  35.76 ml/m LA Vol (A4C):   89.3 ml 46.76 ml/m LA Biplane Vol: 86.8 ml 45.45 ml/m  AORTIC VALVE AV Area (Vmax):    3.97 cm AV Area (Vmean):   3.86 cm AV Area (VTI):     3.68 cm AV Vmax:           140.47 cm/s AV Vmean:          100.930 cm/s AV VTI:            0.263 m AV Peak Grad:      7.9 mmHg AV Mean Grad:      4.5 mmHg LVOT Vmax:         97.30 cm/s LVOT Vmean:        68.100 cm/s LVOT VTI:          0.169 m LVOT/AV VTI ratio: 0.64 AI PHT:            574 msec  AORTA Ao Root diam: 3.10 cm MITRAL  VALVE MV Area (PHT): 6.02 cm    SHUNTS MV Decel Time: 126 msec    Systemic VTI:  0.17 m MV E velocity: 82.70 cm/s  Systemic Diam: 2.70 cm MV A velocity: 52.80 cm/s MV E/A ratio:  1.57 Carlyle Dolly MD Electronically signed by Carlyle Dolly MD Signature Date/Time: 07/07/2020/5:13:03 PM    Final      Discharge Exam: Vitals:   07/11/20 0821 07/11/20 0822  BP: (!) 124/53   Pulse: 70 70  Resp: 20   Temp:    SpO2:     Vitals:   07/11/20 0500 07/11/20 0525 07/11/20 0821 07/11/20 0822  BP:  (!) 116/56 (!) 124/53   Pulse:  68 70 70  Resp:  20 20   Temp:  97.6 F (36.4 C)    TempSrc:  Oral    SpO2:  100%    Weight: 75.7 kg     Height:        General: Pt is alert, awake, not in acute distress Cardiovascular: RRR, S1/S2 +, no rubs, no gallops Respiratory: CTA bilaterally, no wheezing, no rhonchi, on 3 L nasal cannula Abdominal: Soft, NT, ND, bowel sounds + Extremities: no edema, no cyanosis    The results of significant diagnostics from this hospitalization (including imaging, microbiology, ancillary and laboratory) are listed below for reference.      Microbiology: Recent Results (from the past 240 hour(s))  Resp Panel by RT-PCR (Flu A&B, Covid) Nasopharyngeal Swab     Status: Abnormal   Collection Time: 07/05/20 11:02 AM   Specimen: Nasopharyngeal Swab; Nasopharyngeal(NP) swabs in vial transport medium  Result Value Ref Range Status   SARS Coronavirus 2 by RT PCR POSITIVE (A) NEGATIVE Final    Comment: RESULT CALLED TO, READ BACK BY AND VERIFIED WITH: M. CUGINO 07/05/20 @1202  BY S. BEARD (NOTE) SARS-CoV-2 target nucleic acids are DETECTED.  The SARS-CoV-2 RNA is generally detectable in upper respiratory specimens during the acute phase of infection. Positive results are indicative of the presence of the identified virus, but do not rule out bacterial infection or co-infection with other pathogens not detected by the test. Clinical correlation with patient history and other diagnostic information is necessary to determine patient infection status. The expected result is Negative.  Fact Sheet for Patients: EntrepreneurPulse.com.au  Fact Sheet for Healthcare Providers: IncredibleEmployment.be  This test is not yet approved or cleared by the [place] FDA and  has been authorized for detection and/or diagnosis of SARS-CoV-2 by FDA under an Emergency Use Authorization (EUA).  This EUA will remain in effect (meaning this test can  be used) for the duration of  the COVID-19 declaration under Section 564(b)(1) of the Act, 21 U.S.C. section 360bbb-3(b)(1), unless the authorization is terminated or revoked sooner.     Influenza A by PCR NEGATIVE NEGATIVE Final   Influenza B by PCR NEGATIVE NEGATIVE Final    Comment: (NOTE) The Xpert Xpress SARS-CoV-2/FLU/RSV plus assay is intended as an aid in the diagnosis of influenza from Nasopharyngeal swab specimens and should not be used as a sole basis for treatment. Nasal washings and aspirates are unacceptable for Xpert Xpress  SARS-CoV-2/FLU/RSV testing.  Fact Sheet for Patients: EntrepreneurPulse.com.au  Fact Sheet for Healthcare Providers: IncredibleEmployment.be  This test is not yet approved or cleared by the [place] FDA and has been authorized for detection and/or diagnosis of SARS-CoV-2 by FDA under an Emergency Use Authorization (EUA). This EUA will remain in effect (meaning this test can be used) for the duration  of the COVID-19 declaration under Section 564(b)(1) of the Act, 21 U.S.C. section 360bbb-3(b)(1), unless the authorization is terminated or revoked.  Performed at [facility], [address], [place], [place] [address]      Labs: BNP (last 3 results) Recent Labs    07/05/20 1046 07/11/20 0615  BNP 3,380.0* 945.8*   Basic Metabolic Panel: Recent Labs  Lab 07/06/20 0513 07/07/20 0627 07/08/20 0656 07/09/20 0701 07/10/20 0708  NA 143 142 143 143 141  K 4.2 4.1 4.4 4.5 5.0  CL 96* 93* 93* 89* 88*  CO2 42* 43* 41* 47* 44*  GLUCOSE 112* 148* 121* 191* 115*  BUN 37* 34* 27* 31* 25*  CREATININE 1.36* 1.36* 1.00 1.21 0.99  CALCIUM 8.7* 8.5* 8.3* 8.5* 8.9  MG  --  2.1 2.0 2.1 1.9   Liver Function Tests: Recent Labs  Lab 07/05/20 1046  AST 19  ALT 30  ALKPHOS 78  BILITOT 1.1  PROT 6.2*  ALBUMIN 3.4*   No results for input(s): LIPASE, AMYLASE in the last 168 hours. No results for input(s): AMMONIA in the last 168 hours. CBC: Recent Labs  Lab 07/05/20 1256 07/07/20 0627 07/08/20 0656 07/09/20 0701  WBC 15.0* 11.6* 12.0* 10.3  HGB 14.6 13.7 13.9 13.1  HCT 46.8 44.6 44.3 43.2  MCV 103.8* 104.2* 103.5* 104.9*  PLT 98* 92* 83* 93*   Cardiac Enzymes: No results for input(s): CKTOTAL, CKMB, CKMBINDEX, TROPONINI in the last 168 hours. BNP: Invalid input(s): POCBNP CBG: Recent Labs  Lab 07/07/20 2045  GLUCAP 199*   D-Dimer No results for input(s): DDIMER in the last 72 hours. Hgb A1c Recent Labs    07/10/20 0708   HGBA1C 7.1*   Lipid Profile No results for input(s): CHOL, HDL, LDLCALC, TRIG, CHOLHDL, LDLDIRECT in the last 72 hours. Thyroid function studies No results for input(s): TSH, T4TOTAL, T3FREE, THYROIDAB in the last 72 hours.  Invalid input(s): FREET3 Anemia work up No results for input(s): VITAMINB12, FOLATE, FERRITIN, TIBC, IRON, RETICCTPCT in the last 72 hours. Urinalysis    Component Value Date/Time   COLORURINE YELLOW 07/05/2020 1142   APPEARANCEUR CLEAR 07/05/2020 1142   LABSPEC 1.008 07/05/2020 1142   PHURINE 6.0 07/05/2020 1142   GLUCOSEU NEGATIVE 07/05/2020 1142   HGBUR NEGATIVE 07/05/2020 1142   BILIRUBINUR NEGATIVE 07/05/2020 1142   KETONESUR NEGATIVE 07/05/2020 1142   PROTEINUR NEGATIVE 07/05/2020 1142   UROBILINOGEN 0.2 11/21/2013 1946   NITRITE NEGATIVE 07/05/2020 1142   LEUKOCYTESUR NEGATIVE 07/05/2020 1142   Sepsis Labs Invalid input(s): PROCALCITONIN,  WBC,  LACTICIDVEN Microbiology Recent Results (from the past 240 hour(s))  Resp Panel by RT-PCR (Flu A&B, Covid) Nasopharyngeal Swab     Status: Abnormal   Collection Time: 07/05/20 11:02 AM   Specimen: Nasopharyngeal Swab; Nasopharyngeal(NP) swabs in vial transport medium  Result Value Ref Range Status   SARS Coronavirus 2 by RT PCR POSITIVE (A) NEGATIVE Final    Comment: RESULT CALLED TO, READ BACK BY AND VERIFIED WITH: M. CUGINO 07/05/20 @1202  BY S. BEARD (NOTE) SARS-CoV-2 target nucleic acids are DETECTED.  The SARS-CoV-2 RNA is generally detectable in upper respiratory specimens during the acute phase of infection. Positive results are indicative of the presence of the identified virus, but do not rule out bacterial infection or co-infection with other pathogens not detected by the test. Clinical correlation with patient history and other diagnostic information is necessary to determine patient infection status. The expected result is Negative.  Fact Sheet for  Patients: EntrepreneurPulse.com.au  Fact Sheet for Healthcare Providers: IncredibleEmployment.be  This test is not yet approved or cleared by the [place] FDA and  has been authorized for detection and/or diagnosis of SARS-CoV-2 by FDA under an Emergency Use Authorization (EUA).  This EUA will remain in effect (meaning this test can  be used) for the duration of  the COVID-19 declaration under Section 564(b)(1) of the Act, 21 U.S.C. section 360bbb-3(b)(1), unless the authorization is terminated or revoked sooner.     Influenza A by PCR NEGATIVE NEGATIVE Final   Influenza B by PCR NEGATIVE NEGATIVE Final    Comment: (NOTE) The Xpert Xpress SARS-CoV-2/FLU/RSV plus assay is intended as an aid in the diagnosis of influenza from Nasopharyngeal swab specimens and should not be used as a sole basis for treatment. Nasal washings and aspirates are unacceptable for Xpert Xpress SARS-CoV-2/FLU/RSV testing.  Fact Sheet for Patients: EntrepreneurPulse.com.au  Fact Sheet for Healthcare Providers: IncredibleEmployment.be  This test is not yet approved or cleared by the [place] FDA and has been authorized for detection and/or diagnosis of SARS-CoV-2 by FDA under an Emergency Use Authorization (EUA). This EUA will remain in effect (meaning this test can be used) for the duration of the COVID-19 declaration under Section 564(b)(1) of the Act, 21 U.S.C. section 360bbb-3(b)(1), unless the authorization is terminated or revoked.  Performed at [facility], [address] [place], [place] [address]      Time coordinating discharge: 35 minutes  SIGNED:   Rodena Goldmann, DO [facility] 07/11/2020, 11:21 AM  If 7PM-7AM, please contact night-coverage [email or web address]

## 2020-07-11 NOTE — Care Management Important Message (Signed)
Important Message  Patient Details  Name: Troy [name] MRN: [id number] Date of Birth: 23-Jul-1940   Medicare Important Message Given:  Yes     Boneta Lucks, RN 07/11/2020, 11:12 AM

## 2020-07-11 NOTE — Progress Notes (Signed)
ANTICOAGULATION CONSULT NOTE - Pharmacy Consult for warfarin Indication: atrial fibrillation  Allergies  Allergen Reactions   Benadryl [Diphenhydramine Hcl] Nausea And Vomiting    Patient Measurements: Height: 5\' 6"  (167.6 cm) Weight: 75.7 kg (166 lb 14.2 oz) IBW/kg (Calculated) : 63.8  Vital Signs: Temp: 97.6 F (36.4 C) (06/21 0525) Temp Source: Oral (06/21 0525) BP: 124/53 (06/21 0821) Pulse Rate: 70 (06/21 0822)  Labs: Recent Labs    07/09/20 0701 07/10/20 0708 07/11/20 0615  HGB 13.1  --   --   HCT 43.2  --   --   PLT 93*  --   --   LABPROT 32.3* 21.4* 19.4*  INR 3.1* 1.9* 1.6*  CREATININE 1.21 0.99  --      Estimated Creatinine Clearance: 53.7 mL/min (by C-G formula based on SCr of 0.99 mg/dL).   Medical History: Past Medical History:  Diagnosis Date   Atrial fibrillation ([place])    CAD (coronary artery disease) 1996   status post PCI of the RCA    Community acquired pneumonia 08/24/2015   Congestive heart failure, unspecified    COPD (chronic obstructive pulmonary disease) (HCC)    Pt on home O2 at night and PRN, unsure of date of diagnosis.   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HCC)    Nephrolithiasis    Other primary cardiomyopathies    Personal history of DVT (deep vein thrombosis)    Prostatitis    Shingles     Medications:  Medications Prior to Admission  Medication Sig Dispense Refill Last Dose   acetaminophen (TYLENOL) 500 MG tablet Take 1,000 mg by mouth every 6 (six) hours as needed for moderate pain.    PRN   albuterol (ACCUNEB) 1.25 MG/3ML nebulizer solution INHALE CONTENTS OF 1 VIAL IN NEBULIZER EVERY 6 HOURS AS NEEDED. (Patient taking differently: Take 1 ampule by nebulization every 6 (six) hours as needed for wheezing or shortness of breath.) 360 mL 5 Past Week   albuterol (VENTOLIN HFA) 108 (90  Base) MCG/ACT inhaler INHALE 1-2 PUFFS INTO THE LUNGS EVERY 6 HOURS AS NEEDED FOR WHEEZING OR SHORTNESS OF BREATH (Patient taking differently: Inhale 1-2 puffs into the lungs every 6 (six) hours as needed for shortness of breath or wheezing.) 8.5 g 5 Past Week   alfuzosin (UROXATRAL) 10 MG 24 hr tablet Take 10 mg by mouth daily.   07/05/2020   allopurinol (ZYLOPRIM) 100 MG tablet Take 1 tablet (100 mg total) daily by mouth. 90 tablet 0 07/05/2020   atorvastatin (LIPITOR) 10 MG tablet TAKE ONE TABLET BY MOUTH DAILY (Patient taking differently: Take 10 mg by mouth daily.) 30 tablet 11 07/04/2020   benzonatate (TESSALON) 100 MG capsule Take 1 capsule (100 mg total) by mouth every 6 (six) hours as needed for cough. 30 capsule 1 PRN   carvedilol (COREG) 12.5 MG tablet TAKE ONE TABLET BY MOUTH TWICE DAILY. TAKE WITH A MEAL. (Patient taking differently: Take 12.5 mg by mouth 2 (two) times daily with a meal.) 180 tablet 1 07/05/2020 at am   cetirizine (ZYRTEC) 10 MG tablet Take 10 mg by mouth daily.   07/05/2020   Cholecalciferol 25 MCG (1000 UT) tablet Take 1 tablet by mouth daily.   07/05/2020   digoxin (LANOXIN) 0.125 MG tablet TAKE 1/2 TABLET BY MOUTH DAILY (Patient taking differently: Take 0.0625  mg by mouth daily.) 45 tablet 2 07/05/2020   fluticasone (FLONASE) 50 MCG/ACT nasal spray Place 2 sprays into both nostrils at bedtime as needed for allergies. 16 g 3 Past Week   Fluticasone-Umeclidin-Vilant (TRELEGY ELLIPTA) 100-62.5-25 MCG/INH AEPB INHALE ONE PUFF INTO THE LUNGS DAILY (Patient taking differently: Inhale 1 puff into the lungs daily.) 60 each 1 07/05/2020   furosemide (LASIX) 40 MG tablet Take 2 tablets (80 mg total) by mouth 2 (two) times daily. 120 tablet 3 07/05/2020   potassium chloride SA (KLOR-CON) 20 MEQ tablet Take 1.5 tablets (30 mEq total) by mouth daily. 135 tablet 3 07/05/2020   vitamin C (ASCORBIC ACID) 500 MG tablet Take 1,000 mg by mouth every morning.   07/05/2020   warfarin (COUMADIN) 1 MG  tablet TAKE TWO (2) TABLETS BY MOUTH EVERY DAY OR USE AS DIRECTED (Patient taking differently: Take 2 mg by mouth daily.) 65 tablet 3 07/05/2020 at am   zinc gluconate 50 MG tablet Take 50 mg by mouth daily.   07/05/2020   doxycycline (VIBRA-TABS) 100 MG tablet Take 1 tablet (100 mg total) by mouth 2 (two) times daily. (Patient not taking: No sig reported) 14 tablet 0 Completed Course   predniSONE (STERAPRED UNI-PAK 21 TAB) 10 MG (21) TBPK tablet Take by mouth daily. Take 40mg  daily for 3 days, then 30mg  daily for 3 days, then 20mg  daily for 3 days, then 10mg  daily for 3 days, then stop (Patient not taking: No sig reported) 30 tablet 0 Completed Course    Assessment: Pharmacy consulted to dose warfarin in patient with atrial fibrillation.  INR on admission is supratherapeutic at 4.8.  Home dose listed as 1 mg on Wed and 2 mg ROW.  INR 1.9> 1.6  Goal of Therapy:  INR 2-3 Monitor platelets by anticoagulation protocol: Yes   Plan:  Warfarin 2 mg x 1 dose. Monitor daily INR and s/s of bleeding.  Margot Ables, PharmD Clinical Pharmacist 07/11/2020 8:39 AM

## 2020-07-11 NOTE — Progress Notes (Addendum)
Progress Note  Patient Name: Troy [name] Date of Encounter: 07/11/2020  [facility] Cardiologist: Larae Grooms, MD  EP: Dr. Lovena Le   Subjective   Breathing improved but still not back to baseline. No chest pain or palpitations. Still with lower extremity edema.   Inpatient Medications    Scheduled Meds:  alfuzosin  10 mg Oral Daily   allopurinol  100 mg Oral Daily   atorvastatin  10 mg Oral Daily   carvedilol  12.5 mg Oral BID WC   dextromethorphan-guaiFENesin  1 tablet Oral BID   digoxin  62.5 mcg Oral Daily   furosemide  80 mg Intravenous Once   hydrALAZINE  10 mg Oral Q8H   loratadine  10 mg Oral Daily   sodium chloride flush  3 mL Intravenous Q12H   warfarin  2 mg Oral ONCE-1600   Warfarin - Pharmacist Dosing Inpatient   Does not apply q1600   Continuous Infusions:  sodium chloride     PRN Meds: sodium chloride, acetaminophen, fluticasone, guaiFENesin-dextromethorphan, ondansetron (ZOFRAN) IV, ondansetron (ZOFRAN) IV, sodium chloride flush   Vital Signs    Vitals:   07/11/20 0500 07/11/20 0525 07/11/20 0821 07/11/20 0822  BP:  (!) 116/56 (!) 124/53   Pulse:  68 70 70  Resp:  20 20   Temp:  97.6 F (36.4 C)    TempSrc:  Oral    SpO2:  100%    Weight: 75.7 kg     Height:        Intake/Output Summary (Last 24 hours) at 07/11/2020 0955 Last data filed at 07/11/2020 0900 Gross per 24 hour  Intake 577.26 ml  Output 900 ml  Net -322.74 ml   Last 3 Weights 07/11/2020 07/10/2020 07/09/2020  Weight (lbs) 166 lb 14.2 oz 158 lb 1.1 oz 166 lb 0.1 oz  Weight (kg) 75.7 kg 71.7 kg 75.3 kg      Telemetry    V-paced, HR in 70's. Occasional PVC's.  - Personally Reviewed  ECG    No new tracings.   Physical Exam   GEN: Pleasant male appearing in no acute distress.   Neck: JVD at 9 cm.  Cardiac: RRR, no murmurs, rubs, or gallops.  Respiratory: Rhonchi along upper lung fields.  GI: Soft, nontender, non-distended  MS: 1+ pitting edema bilaterally;  No deformity. Neuro:  Nonfocal  Psych: Normal affect   Labs    High Sensitivity Troponin:   Recent Labs  Lab 07/05/20 1046 07/05/20 1256  TROPONINIHS 91* 88*      Chemistry Recent Labs  Lab 07/05/20 1046 07/06/20 0513 07/08/20 0656 07/09/20 0701 07/10/20 0708  NA 141   < > 143 143 141  K 3.6   < > 4.4 4.5 5.0  CL 93*   < > 93* 89* 88*  CO2 39*   < > 41* 47* 44*  GLUCOSE 150*   < > 121* 191* 115*  BUN 36*   < > 27* 31* 25*  CREATININE 1.25*   < > 1.00 1.21 0.99  CALCIUM 9.0   < > 8.3* 8.5* 8.9  PROT 6.2*  --   --   --   --   ALBUMIN 3.4*  --   --   --   --   AST 19  --   --   --   --   ALT 30  --   --   --   --   ALKPHOS 78  --   --   --   --  BILITOT 1.1  --   --   --   --   GFRNONAA 58*   < > >60 >60 >60  ANIONGAP 9   < > 9 7 9    < > = values in this interval not displayed.     Hematology Recent Labs  Lab 07/07/20 0627 07/08/20 0656 07/09/20 0701  WBC 11.6* 12.0* 10.3  RBC 4.28 4.28 4.12*  HGB 13.7 13.9 13.1  HCT 44.6 44.3 43.2  MCV 104.2* 103.5* 104.9*  MCH 32.0 32.5 31.8  MCHC 30.7 31.4 30.3  RDW 16.5* 16.2* 16.4*  PLT 92* 83* 93*    BNP Recent Labs  Lab 07/05/20 1046 07/11/20 0615  BNP 3,380.0* 517.0*     DDimer  Recent Labs  Lab 07/05/20 1256  DDIMER 0.31     Radiology    No results found.  Cardiac Studies   Echocardiogram: 07/07/2020 IMPRESSIONS     1. Left ventricular ejection fraction, by estimation, is 20 to 25%. The  left ventricle has severely decreased function. The left ventricle  demonstrates global hypokinesis. There is mild left ventricular  hypertrophy. Left ventricular diastolic parameters   are indeterminate.   2. Right ventricular systolic function is normal. The right ventricular  size is normal.   3. Left atrial size was severely dilated.   4. Right atrial size was mild to moderately dilated.   5. The mitral valve is normal in structure. Mild mitral valve  regurgitation. No evidence of mitral stenosis.    6. The aortic valve is tricuspid. There is mild calcification of the  aortic valve. There is mild thickening of the aortic valve. Aortic valve  regurgitation is mild.   7. The inferior vena cava is normal in size with greater than 50%  respiratory variability, suggesting right atrial pressure of 3 mmHg.   Patient Profile     80 y.o. male with past medical history of CAD (s/p PCI of RCA in 1996, s/p NST in 01/2017 showing scar with no ischemia), HFrEF (EF 20-25% in 2015 and 25-30% in 01/2017, BiV ICD in place which is followed by Dr. Lovena Le), paroxysmal atrial fibrillation, HTN, HLD, COPD and Stage 3-4 CKD who is currently admitted for an acute CHF exacerbation. Had recently been treated for COVID-19 PNA two weeks prior to admission.   Assessment & Plan   1. HFrEF - Repeat echocardiogram this admission shows his EF remains reduced at 20-25% with global HK. RV function normal with biatrial dilation. BNP was elevated to 3380 on admission, improved to 517 by repeat this AM.  - He has been receiving IV Lasix 80mg  as BID dosing and TID dosing this admission and was switched back to PO dosing on 6/19 by the admitting team but was volume overloaded on examination yesterday and received an additional 80mg  of IV Lasix in addition to PO Lasix 80mg . Recorded output of -5.8 L but not recorded for some days. Variable weights also recorded. He remains volume overloaded on examination and will dose IV Lasix 80mg  x1. Will see if a BMET can be added-on to his previously drawn labs.  - He has been continued on Coreg 12.5mg  BID and Digoxin 62.5 mcg daily (will have to follow renal function closely as an outpatient to make sure it is appropriate for him to continue on this). He had not been on an ACE-I/ARB/ARNI/Spiro/SGLT2 inhibitor as an outpatient given his variable renal function. Was started on Hydralazine 10mg  TID and Imdur 30mg  daily yesterday but had some dizziness. Will  hold Imdur for now and possibly restart  once back on PO diuretics.   2. Paroxysmal Atrial Fibrillation - A rate-control strategy has been pursued as he is not an ideal candidate for anti-arrhythmic therapy given his COPD and renal issues.  - He has been V-paced in the 70's by review of telemetry. Remains on Coreg 12.5mg  BID and Digoxin 62.5 mcg daily for rate-control. - On Coumadin for anticoagulation. INR was initially supratherapuetic but has now trended down and Coumadin has been resumed. Appreciate Pharmacy's assistance with dosing.   3. CAD - Hs Troponin values were slightly elevated at 91 and 88 this admission, felt to be most consistent with demand ischemia in the setting of an acute CHF exacerbation and not ACS.  - Remains on Coreg 12.5mg  BID and Atorvastatin 10mg  daily. Not on ASA given the need for anticoagulation.   4. Stage 3 CKD - Baseline creatinine 1.2 - 1.4. Peaked at 1.36 this admission, improved to 0.99 as of 06/20 with diuresis.   5. COPD - He is on 3 L [place] at baseline.    For questions or updates, please contact [place] Please consult [email or web address] for contact info under        Signed, MACIEJ SCHWEITZER, PA-C  07/11/2020, 9:55 AM     Patient seen and discussed and PA Roark, I agree with her documentation. Being managed for acute on chronic systolic HF. Incomplete I/Os, weights are inaccurate.BNP 3400-->517 this admission.  Received IV lasix 80mg  x 1 and 80mg  oral x 1 yesterday, no bmet today, renal function has improved with diuresis this admission. Medical therapy with hydral 10 tid, imdur 30, digoxin, coreg 12.5mg  bid. Labile renal function, assume why he has not been on ACE/ARB/ARNI/aldactone. From primary team note some orthostasis yesterday. Would change to oral torsemide 40mg  bid at discharge (had been on lasix 80mg  bid at home). With orthostasis yesterday hold on starting imdur, may start at f/u. Would be reasonable for d/c today, we will arrange outpatient f/u.      Carlyle Dolly MD

## 2020-07-11 NOTE — Telephone Encounter (Signed)
New message:     Santiago Glad from [place] called. She wants to know if Dr Irish Lack will sign orders for this pt to do Home Health Visits? Pt is being discharged from [facility] today with dx of CHF.

## 2020-07-12 ENCOUNTER — Telehealth: Payer: Self-pay | Admitting: *Deleted

## 2020-07-12 DIAGNOSIS — J9611 Chronic respiratory failure with hypoxia: Secondary | ICD-10-CM | POA: Diagnosis not present

## 2020-07-12 DIAGNOSIS — I480 Paroxysmal atrial fibrillation: Secondary | ICD-10-CM | POA: Diagnosis not present

## 2020-07-12 DIAGNOSIS — I130 Hypertensive heart and chronic kidney disease with heart failure and stage 1 through stage 4 chronic kidney disease, or unspecified chronic kidney disease: Secondary | ICD-10-CM | POA: Diagnosis not present

## 2020-07-12 DIAGNOSIS — I5023 Acute on chronic systolic (congestive) heart failure: Secondary | ICD-10-CM | POA: Diagnosis not present

## 2020-07-12 DIAGNOSIS — Z95810 Presence of automatic (implantable) cardiac defibrillator: Secondary | ICD-10-CM | POA: Diagnosis not present

## 2020-07-12 DIAGNOSIS — J449 Chronic obstructive pulmonary disease, unspecified: Secondary | ICD-10-CM | POA: Diagnosis not present

## 2020-07-12 DIAGNOSIS — I2510 Atherosclerotic heart disease of native coronary artery without angina pectoris: Secondary | ICD-10-CM | POA: Diagnosis not present

## 2020-07-12 DIAGNOSIS — E119 Type 2 diabetes mellitus without complications: Secondary | ICD-10-CM | POA: Diagnosis not present

## 2020-07-12 DIAGNOSIS — N1831 Chronic kidney disease, stage 3a: Secondary | ICD-10-CM | POA: Diagnosis not present

## 2020-07-12 DIAGNOSIS — U071 COVID-19: Secondary | ICD-10-CM | POA: Diagnosis not present

## 2020-07-12 NOTE — Telephone Encounter (Signed)
Received call transferred from operator and spoke with Marzetta Board (physical therapist) who is seeing patient today.  Marzetta Board is calling to report patient is taking allopurinol and warfarin.  These medications are not new.  Patient is followed in [facility] coumadin clinic.  Marzetta Board reports patient has orders for PT and [employer], [employer].  She is going to request home health nursing. Home health nurse will be able to check INR at home. Will need order to check at home. Will forward to coumadin clinic to make them aware and see if INR can be checked at home and if so when it should be checked.  Order can be called to office--[phone]

## 2020-07-12 NOTE — Telephone Encounter (Signed)
Called and spoke with [name] at [facility].  Orders given to check INR at home.  First check will be Thursday 6/23 or Monday 6/27 which ever they can do first.  They will call results to me at [facility] office.

## 2020-07-14 DIAGNOSIS — I480 Paroxysmal atrial fibrillation: Secondary | ICD-10-CM | POA: Diagnosis not present

## 2020-07-14 DIAGNOSIS — I5023 Acute on chronic systolic (congestive) heart failure: Secondary | ICD-10-CM | POA: Diagnosis not present

## 2020-07-14 DIAGNOSIS — J9611 Chronic respiratory failure with hypoxia: Secondary | ICD-10-CM | POA: Diagnosis not present

## 2020-07-14 DIAGNOSIS — Z95810 Presence of automatic (implantable) cardiac defibrillator: Secondary | ICD-10-CM | POA: Diagnosis not present

## 2020-07-14 DIAGNOSIS — E119 Type 2 diabetes mellitus without complications: Secondary | ICD-10-CM | POA: Diagnosis not present

## 2020-07-14 DIAGNOSIS — U071 COVID-19: Secondary | ICD-10-CM | POA: Diagnosis not present

## 2020-07-14 DIAGNOSIS — I2510 Atherosclerotic heart disease of native coronary artery without angina pectoris: Secondary | ICD-10-CM | POA: Diagnosis not present

## 2020-07-14 DIAGNOSIS — J449 Chronic obstructive pulmonary disease, unspecified: Secondary | ICD-10-CM | POA: Diagnosis not present

## 2020-07-14 DIAGNOSIS — I130 Hypertensive heart and chronic kidney disease with heart failure and stage 1 through stage 4 chronic kidney disease, or unspecified chronic kidney disease: Secondary | ICD-10-CM | POA: Diagnosis not present

## 2020-07-14 DIAGNOSIS — N1831 Chronic kidney disease, stage 3a: Secondary | ICD-10-CM | POA: Diagnosis not present

## 2020-07-17 ENCOUNTER — Inpatient Hospital Stay (HOSPITAL_COMMUNITY)
Admission: EM | Admit: 2020-07-17 | Discharge: 2020-07-22 | DRG: 871 | Disposition: A | Discharge status: Skilled Nursing Facility | Payer: Medicare Other | Attending: Family Medicine | Admitting: Family Medicine

## 2020-07-17 ENCOUNTER — Encounter (HOSPITAL_COMMUNITY): Payer: Self-pay | Admitting: Emergency Medicine

## 2020-07-17 ENCOUNTER — Emergency Department (HOSPITAL_COMMUNITY): Payer: Medicare Other

## 2020-07-17 ENCOUNTER — Other Ambulatory Visit: Payer: Self-pay

## 2020-07-17 ENCOUNTER — Telehealth: Payer: Self-pay | Admitting: Interventional Cardiology

## 2020-07-17 DIAGNOSIS — Z66 Do not resuscitate: Secondary | ICD-10-CM | POA: Diagnosis not present

## 2020-07-17 DIAGNOSIS — J9611 Chronic respiratory failure with hypoxia: Secondary | ICD-10-CM | POA: Diagnosis present

## 2020-07-17 DIAGNOSIS — Z888 Allergy status to other drugs, medicaments and biological substances status: Secondary | ICD-10-CM

## 2020-07-17 DIAGNOSIS — I130 Hypertensive heart and chronic kidney disease with heart failure and stage 1 through stage 4 chronic kidney disease, or unspecified chronic kidney disease: Secondary | ICD-10-CM | POA: Diagnosis present

## 2020-07-17 DIAGNOSIS — I255 Ischemic cardiomyopathy: Secondary | ICD-10-CM | POA: Diagnosis not present

## 2020-07-17 DIAGNOSIS — J440 Chronic obstructive pulmonary disease with acute lower respiratory infection: Secondary | ICD-10-CM | POA: Diagnosis not present

## 2020-07-17 DIAGNOSIS — Z7901 Long term (current) use of anticoagulants: Secondary | ICD-10-CM

## 2020-07-17 DIAGNOSIS — M1990 Unspecified osteoarthritis, unspecified site: Secondary | ICD-10-CM | POA: Diagnosis present

## 2020-07-17 DIAGNOSIS — N179 Acute kidney failure, unspecified: Secondary | ICD-10-CM | POA: Diagnosis present

## 2020-07-17 DIAGNOSIS — E860 Dehydration: Secondary | ICD-10-CM | POA: Diagnosis present

## 2020-07-17 DIAGNOSIS — I420 Dilated cardiomyopathy: Secondary | ICD-10-CM | POA: Diagnosis present

## 2020-07-17 DIAGNOSIS — A498 Other bacterial infections of unspecified site: Secondary | ICD-10-CM

## 2020-07-17 DIAGNOSIS — E1122 Type 2 diabetes mellitus with diabetic chronic kidney disease: Secondary | ICD-10-CM | POA: Diagnosis not present

## 2020-07-17 DIAGNOSIS — R6883 Chills (without fever): Secondary | ICD-10-CM | POA: Diagnosis not present

## 2020-07-17 DIAGNOSIS — M109 Gout, unspecified: Secondary | ICD-10-CM | POA: Diagnosis present

## 2020-07-17 DIAGNOSIS — Z8616 Personal history of COVID-19: Secondary | ICD-10-CM | POA: Diagnosis not present

## 2020-07-17 DIAGNOSIS — R6520 Severe sepsis without septic shock: Secondary | ICD-10-CM | POA: Diagnosis present

## 2020-07-17 DIAGNOSIS — K219 Gastro-esophageal reflux disease without esophagitis: Secondary | ICD-10-CM | POA: Diagnosis present

## 2020-07-17 DIAGNOSIS — I10 Essential (primary) hypertension: Secondary | ICD-10-CM | POA: Diagnosis not present

## 2020-07-17 DIAGNOSIS — M545 Low back pain, unspecified: Secondary | ICD-10-CM | POA: Diagnosis not present

## 2020-07-17 DIAGNOSIS — Z8249 Family history of ischemic heart disease and other diseases of the circulatory system: Secondary | ICD-10-CM

## 2020-07-17 DIAGNOSIS — I959 Hypotension, unspecified: Secondary | ICD-10-CM | POA: Diagnosis not present

## 2020-07-17 DIAGNOSIS — I482 Chronic atrial fibrillation, unspecified: Secondary | ICD-10-CM | POA: Diagnosis not present

## 2020-07-17 DIAGNOSIS — R9431 Abnormal electrocardiogram [ECG] [EKG]: Secondary | ICD-10-CM | POA: Diagnosis not present

## 2020-07-17 DIAGNOSIS — Z1612 Extended spectrum beta lactamase (ESBL) resistance: Secondary | ICD-10-CM | POA: Diagnosis not present

## 2020-07-17 DIAGNOSIS — I5023 Acute on chronic systolic (congestive) heart failure: Secondary | ICD-10-CM | POA: Diagnosis not present

## 2020-07-17 DIAGNOSIS — L8990 Pressure ulcer of unspecified site, unspecified stage: Secondary | ICD-10-CM | POA: Diagnosis present

## 2020-07-17 DIAGNOSIS — Z87891 Personal history of nicotine dependence: Secondary | ICD-10-CM

## 2020-07-17 DIAGNOSIS — Z7951 Long term (current) use of inhaled steroids: Secondary | ICD-10-CM

## 2020-07-17 DIAGNOSIS — Z86718 Personal history of other venous thrombosis and embolism: Secondary | ICD-10-CM

## 2020-07-17 DIAGNOSIS — J189 Pneumonia, unspecified organism: Secondary | ICD-10-CM | POA: Diagnosis present

## 2020-07-17 DIAGNOSIS — Z9981 Dependence on supplemental oxygen: Secondary | ICD-10-CM

## 2020-07-17 DIAGNOSIS — Z955 Presence of coronary angioplasty implant and graft: Secondary | ICD-10-CM

## 2020-07-17 DIAGNOSIS — N390 Urinary tract infection, site not specified: Secondary | ICD-10-CM | POA: Diagnosis not present

## 2020-07-17 DIAGNOSIS — E119 Type 2 diabetes mellitus without complications: Secondary | ICD-10-CM

## 2020-07-17 DIAGNOSIS — R509 Fever, unspecified: Secondary | ICD-10-CM | POA: Diagnosis not present

## 2020-07-17 DIAGNOSIS — I5021 Acute systolic (congestive) heart failure: Secondary | ICD-10-CM | POA: Diagnosis not present

## 2020-07-17 DIAGNOSIS — I2510 Atherosclerotic heart disease of native coronary artery without angina pectoris: Secondary | ICD-10-CM | POA: Diagnosis not present

## 2020-07-17 DIAGNOSIS — A4151 Sepsis due to Escherichia coli [E. coli]: Secondary | ICD-10-CM | POA: Diagnosis not present

## 2020-07-17 DIAGNOSIS — U071 COVID-19: Secondary | ICD-10-CM | POA: Diagnosis not present

## 2020-07-17 DIAGNOSIS — R531 Weakness: Secondary | ICD-10-CM | POA: Diagnosis not present

## 2020-07-17 DIAGNOSIS — N1831 Chronic kidney disease, stage 3a: Secondary | ICD-10-CM | POA: Diagnosis not present

## 2020-07-17 DIAGNOSIS — Z95810 Presence of automatic (implantable) cardiac defibrillator: Secondary | ICD-10-CM

## 2020-07-17 DIAGNOSIS — Z79899 Other long term (current) drug therapy: Secondary | ICD-10-CM

## 2020-07-17 DIAGNOSIS — J449 Chronic obstructive pulmonary disease, unspecified: Secondary | ICD-10-CM | POA: Diagnosis not present

## 2020-07-17 DIAGNOSIS — L89159 Pressure ulcer of sacral region, unspecified stage: Secondary | ICD-10-CM | POA: Diagnosis present

## 2020-07-17 DIAGNOSIS — J90 Pleural effusion, not elsewhere classified: Secondary | ICD-10-CM | POA: Diagnosis not present

## 2020-07-17 DIAGNOSIS — I517 Cardiomegaly: Secondary | ICD-10-CM | POA: Diagnosis not present

## 2020-07-17 DIAGNOSIS — Z800 Family history of malignant neoplasm of digestive organs: Secondary | ICD-10-CM

## 2020-07-17 DIAGNOSIS — Z823 Family history of stroke: Secondary | ICD-10-CM

## 2020-07-17 LAB — CBC WITH DIFFERENTIAL/PLATELET
Abs Immature Granulocytes: 0.11 10*3/uL — ABNORMAL HIGH (ref 0.00–0.07)
Basophils Absolute: 0 10*3/uL (ref 0.0–0.1)
Basophils Relative: 0 %
Eosinophils Absolute: 0.1 10*3/uL (ref 0.0–0.5)
Eosinophils Relative: 1 %
HCT: 37.8 % — ABNORMAL LOW (ref 39.0–52.0)
Hemoglobin: 11.9 g/dL — ABNORMAL LOW (ref 13.0–17.0)
Immature Granulocytes: 1 %
Lymphocytes Relative: 5 %
Lymphs Abs: 0.5 10*3/uL — ABNORMAL LOW (ref 0.7–4.0)
MCH: 32.6 pg (ref 26.0–34.0)
MCHC: 31.5 g/dL (ref 30.0–36.0)
MCV: 103.6 fL — ABNORMAL HIGH (ref 80.0–100.0)
Monocytes Absolute: 0.5 10*3/uL (ref 0.1–1.0)
Monocytes Relative: 5 %
Neutro Abs: 8.1 10*3/uL — ABNORMAL HIGH (ref 1.7–7.7)
Neutrophils Relative %: 88 %
Platelets: 143 10*3/uL — ABNORMAL LOW (ref 150–400)
RBC: 3.65 MIL/uL — ABNORMAL LOW (ref 4.22–5.81)
RDW: 15.9 % — ABNORMAL HIGH (ref 11.5–15.5)
WBC: 9.3 10*3/uL (ref 4.0–10.5)
nRBC: 0 % (ref 0.0–0.2)

## 2020-07-17 LAB — COMPREHENSIVE METABOLIC PANEL
ALT: 17 U/L (ref 0–44)
AST: 19 U/L (ref 15–41)
Albumin: 2.8 g/dL — ABNORMAL LOW (ref 3.5–5.0)
Alkaline Phosphatase: 81 U/L (ref 38–126)
Anion gap: 12 (ref 5–15)
BUN: 26 mg/dL — ABNORMAL HIGH (ref 8–23)
CO2: 33 mmol/L — ABNORMAL HIGH (ref 22–32)
Calcium: 8.5 mg/dL — ABNORMAL LOW (ref 8.9–10.3)
Chloride: 95 mmol/L — ABNORMAL LOW (ref 98–111)
Creatinine, Ser: 1.45 mg/dL — ABNORMAL HIGH (ref 0.61–1.24)
GFR, Estimated: 49 mL/min — ABNORMAL LOW (ref >60)
Glucose, Bld: 147 mg/dL — ABNORMAL HIGH (ref 70–99)
Potassium: 3.9 mmol/L (ref 3.5–5.1)
Sodium: 140 mmol/L (ref 135–145)
Total Bilirubin: 1 mg/dL (ref 0.3–1.2)
Total Protein: 5.7 g/dL — ABNORMAL LOW (ref 6.5–8.1)

## 2020-07-17 LAB — PROTIME-INR
INR: 2.3 — ABNORMAL HIGH (ref 0.8–1.2)
Prothrombin Time: 25.3 seconds — ABNORMAL HIGH (ref 11.4–15.2)

## 2020-07-17 LAB — DIGOXIN LEVEL: Digoxin Level: 0.8 ng/mL (ref 0.8–2.0)

## 2020-07-17 MED ORDER — ATORVASTATIN CALCIUM 10 MG PO TABS
10.0000 mg | ORAL_TABLET | Freq: Every day | ORAL | Status: DC
  Administered 2020-07-18 – 2020-07-22 (×5): 10 mg via ORAL
  Filled 2020-07-17 (×5): qty 1

## 2020-07-17 MED ORDER — CARVEDILOL 12.5 MG PO TABS
12.5000 mg | ORAL_TABLET | Freq: Two times a day (BID) | ORAL | Status: DC
  Administered 2020-07-18 – 2020-07-19 (×3): 12.5 mg via ORAL
  Filled 2020-07-17 (×4): qty 1

## 2020-07-17 MED ORDER — ALFUZOSIN HCL ER 10 MG PO TB24
10.0000 mg | ORAL_TABLET | Freq: Every day | ORAL | Status: DC
  Administered 2020-07-18 – 2020-07-22 (×5): 10 mg via ORAL
  Filled 2020-07-17 (×7): qty 1

## 2020-07-17 MED ORDER — DIGOXIN 125 MCG PO TABS
62.5000 ug | ORAL_TABLET | Freq: Every day | ORAL | Status: DC
  Administered 2020-07-18 – 2020-07-22 (×5): 62.5 ug via ORAL
  Filled 2020-07-17 (×5): qty 1

## 2020-07-17 MED ORDER — ACETAMINOPHEN 500 MG PO TABS
1000.0000 mg | ORAL_TABLET | Freq: Four times a day (QID) | ORAL | Status: DC | PRN
  Administered 2020-07-18 – 2020-07-19 (×3): 1000 mg via ORAL
  Filled 2020-07-17 (×3): qty 2

## 2020-07-17 MED ORDER — UMECLIDINIUM BROMIDE 62.5 MCG/INH IN AEPB
1.0000 | INHALATION_SPRAY | Freq: Every day | RESPIRATORY_TRACT | Status: DC
  Administered 2020-07-20 – 2020-07-22 (×3): 1 via RESPIRATORY_TRACT
  Filled 2020-07-17 (×2): qty 7

## 2020-07-17 MED ORDER — FLUTICASONE-UMECLIDIN-VILANT 100-62.5-25 MCG/INH IN AEPB: 1.0000 | INHALATION_SPRAY | Freq: Every day | RESPIRATORY_TRACT | Status: DC

## 2020-07-17 MED ORDER — ALBUTEROL SULFATE HFA 108 (90 BASE) MCG/ACT IN AERS
2.0000 | INHALATION_SPRAY | RESPIRATORY_TRACT | Status: DC | PRN
  Administered 2020-07-18 – 2020-07-19 (×2): 2 via RESPIRATORY_TRACT
  Filled 2020-07-17: qty 6.7

## 2020-07-17 MED ORDER — POTASSIUM CHLORIDE CRYS ER 20 MEQ PO TBCR
30.0000 meq | EXTENDED_RELEASE_TABLET | Freq: Every day | ORAL | Status: DC
  Administered 2020-07-18: 30 meq via ORAL
  Filled 2020-07-17: qty 2

## 2020-07-17 MED ORDER — WARFARIN SODIUM 2 MG PO TABS
2.0000 mg | ORAL_TABLET | Freq: Every morning | ORAL | Status: DC
  Filled 2020-07-17: qty 1

## 2020-07-17 MED ORDER — FLUTICASONE FUROATE-VILANTEROL 100-25 MCG/INH IN AEPB
1.0000 | INHALATION_SPRAY | Freq: Every day | RESPIRATORY_TRACT | Status: DC
  Administered 2020-07-20 – 2020-07-22 (×3): 1 via RESPIRATORY_TRACT
  Filled 2020-07-17 (×2): qty 28

## 2020-07-17 MED ORDER — ASCORBIC ACID 500 MG PO TABS
1000.0000 mg | ORAL_TABLET | Freq: Every morning | ORAL | Status: DC
  Administered 2020-07-18 – 2020-07-22 (×5): 1000 mg via ORAL
  Filled 2020-07-17 (×5): qty 2

## 2020-07-17 MED ORDER — WARFARIN - PHYSICIAN DOSING INPATIENT: Freq: Every day | Status: DC

## 2020-07-17 MED ORDER — CARVEDILOL 12.5 MG PO TABS: 12.5000 mg | ORAL_TABLET | Freq: Once | ORAL | Status: DC

## 2020-07-17 MED ORDER — TORSEMIDE 20 MG PO TABS
40.0000 mg | ORAL_TABLET | Freq: Two times a day (BID) | ORAL | Status: DC
  Administered 2020-07-18 (×2): 40 mg via ORAL
  Filled 2020-07-17 (×2): qty 2

## 2020-07-17 MED ORDER — ALLOPURINOL 100 MG PO TABS
100.0000 mg | ORAL_TABLET | Freq: Every day | ORAL | Status: DC
  Administered 2020-07-18: 100 mg via ORAL
  Filled 2020-07-17: qty 1

## 2020-07-17 NOTE — Telephone Encounter (Signed)
LMOM for [employer] [facility].  OK to wait and draw INT on Thursday 07/20/20.

## 2020-07-17 NOTE — ED Notes (Signed)
Pts niece called saying that pt wouldn't be able to go home tonight due to not being able to care for self. Also stating that he doesn't have anyone to come pick up pt as well. Asking that pt be admitted to the hospital overnight.

## 2020-07-17 NOTE — Telephone Encounter (Signed)
New message     They can not do the INR appt today , they do not come to [facility] until Thursday

## 2020-07-17 NOTE — ED Notes (Signed)
Pt in bed, apple sauce, crackers and water given, pt in bed, pt states that he is much more comfortable after repositioning.  Denies pain

## 2020-07-17 NOTE — ED Provider Notes (Addendum)
[facility] EMERGENCY DEPARTMENT Provider Note   CSN: [id number] Arrival date & time: 07/17/20  1705     History Chief Complaint  Patient presents with   Weakness    Troy [name] is a 80 y.o. male.  The history is provided by the patient. No language interpreter was used.  Weakness Severity:  Moderate Onset quality:  Gradual Duration:  3 weeks Timing:  Constant Progression:  Worsening Chronicity:  New Relieved by:  Nothing Worsened by:  Nothing Ineffective treatments:  None tried Associated symptoms: no abdominal pain   Risk factors: no new medications     Pt complains of being weak.  Pt reports he is having trouble walking at home.  He is using a walker but has a hard time walking.  Pt is on 02 at home but has trouble walking.   Pt's niece spoke with Charge nurse and advised pt can not stay at home.   Pt report he wants to go to a rehab facility and try to get stronger.   Past Medical History:  Diagnosis Date   Atrial fibrillation ([place])    CAD (coronary artery disease) 1996   status post PCI of the RCA    Community acquired pneumonia 08/24/2015   Congestive heart failure, unspecified    COPD (chronic obstructive pulmonary disease) (HCC)    Pt on home O2 at night and PRN, unsure of date of diagnosis.   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[place])    Nephrolithiasis    Other primary cardiomyopathies    Personal history of DVT (deep vein thrombosis)    Prostatitis    Shingles     Patient Active Problem List   Diagnosis Date Noted   COVID-19    Secondary hypercoagulable state ([place]) 11/05/2019   Chronic respiratory failure with hypoxia ([place]) 01/31/2017   Acute on chronic systolic CHF (congestive heart failure) ([place]) 01/31/2017   Allergic rhinitis 12/05/2016   Hoarseness 11/04/2016   Controlled type 2 diabetes mellitus  without complication, without long-term current use of insulin ([place]) 09/19/2016   High risk medications (not anticoagulants) long-term use 09/19/2016   History of ASCVD 09/19/2016   Mixed hyperlipidemia 09/19/2016   Sudden visual loss of left eye 09/19/2016   Lower GI bleed 09/11/2015   Bright red blood per rectum 09/11/2015   BPH (benign prostatic hyperplasia) 12/25/2014   FTT (failure to thrive) in adult 12/25/2014   Encounter for therapeutic drug monitoring 08/05/2014   Pulmonary nodules 12/23/2013   Hemoptysis    Dyspnea    Acute on chronic congestive heart failure ([place]) 11/21/2013   Chronic kidney disease, stage 3 ([place])    Coronary artery disease involving native coronary artery of native heart without angina pectoris    Paroxysmal atrial fibrillation (HCC)    Frequent PVCs    Hypoxia    COPD (chronic obstructive pulmonary disease) ([place]) 06/16/2013   Lipoma of neck 09/11/2011   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   Chronic atrial fibrillation ([place]) 31/51/7616   Chronic systolic CHF (congestive heart failure) ([place])    Cardiomyopathy, primary ([place])    Diverticulosis of colon with hemorrhage 08/28/2010    Past Surgical History:  Procedure Laterality Date   BACK SURGERY     BIV ICD GENERATOR CHANGEOUT N/A 12/14/2018   Procedure: BIV ICD GENERATOR CHANGEOUT;  Surgeon:  Evans Lance, MD;  Location: [facility];  Service: Cardiovascular;  Laterality: N/A;   CARDIAC DEFIBRILLATOR PLACEMENT     CORONARY STENT PLACEMENT     PACEMAKER INSERTION         Family History  Problem Relation Age of Onset   Heart disease Father    Cancer Father        LUNG   Colon cancer Mother    Stroke Paternal Uncle    Heart attack Neg Hx    Hyperlipidemia Neg Hx    Hypertension Neg Hx     Social History   Tobacco Use   Smoking status: Former    Packs/day: 4.00    Years: 50.00    Pack years: 200.00    Types: Cigarettes    Quit date:  01/22/1980    Years since quitting: 40.5   Smokeless tobacco: Never  Vaping Use   Vaping Use: Never used  Substance Use Topics   Alcohol use: No    Alcohol/week: 0.0 standard drinks    Comment: denies   Drug use: No    Home Medications Prior to Admission medications   Medication Sig Start Date End Date Taking? Authorizing Provider  acetaminophen (TYLENOL) 500 MG tablet Take 1,000 mg by mouth every 6 (six) hours as needed for moderate pain.     [provider]  albuterol (ACCUNEB) 1.25 MG/3ML nebulizer solution INHALE CONTENTS OF 1 VIAL IN NEBULIZER EVERY 6 HOURS AS NEEDED. 10/29/19   Collene Gobble, MD  albuterol (VENTOLIN HFA) 108 (90 Base) MCG/ACT inhaler INHALE 1-2 PUFFS INTO THE LUNGS EVERY 6 HOURS AS NEEDED FOR WHEEZING OR SHORTNESS OF BREATH 01/11/20   Collene Gobble, MD  alfuzosin (UROXATRAL) 10 MG 24 hr tablet Take 10 mg by mouth daily. 02/23/19   [provider]  allopurinol (ZYLOPRIM) 100 MG tablet Take 1 tablet (100 mg total) daily by mouth. 11/29/16   Caren Macadam, MD  atorvastatin (LIPITOR) 10 MG tablet TAKE ONE TABLET BY MOUTH DAILY 07/16/19   Jettie Booze, MD  benzonatate (TESSALON) 100 MG capsule Take 1 capsule (100 mg total) by mouth every 6 (six) hours as needed for cough. 06/15/20   Collene Gobble, MD  carvedilol (COREG) 12.5 MG tablet TAKE ONE TABLET BY MOUTH TWICE DAILY. TAKE WITH A MEAL. 06/05/20   Jettie Booze, MD  cetirizine (ZYRTEC) 10 MG tablet Take 10 mg by mouth daily.    [provider]  Cholecalciferol 25 MCG (1000 UT) tablet Take 1 tablet by mouth daily. 06/22/20 09/20/20  [provider]  digoxin (LANOXIN) 0.125 MG tablet TAKE 1/2 TABLET BY MOUTH DAILY 05/11/20   Jettie Booze, MD  fluticasone [facility]) 50 MCG/ACT nasal spray Place 2 sprays into both nostrils at bedtime as needed for allergies. 10/22/16   Caren Macadam, MD  Fluticasone-Umeclidin-Vilant (TRELEGY ELLIPTA) 100-62.5-25 MCG/INH AEPB INHALE ONE  PUFF INTO THE LUNGS DAILY 05/11/20   Collene Gobble, MD  guaiFENesin-dextromethorphan (ROBITUSSIN DM) 100-10 MG/5ML syrup Take 15 mLs by mouth every 4 (four) hours as needed for cough. 07/11/20   Manuella Ghazi, Pratik D, DO  potassium chloride SA (KLOR-CON) 20 MEQ tablet Take 1.5 tablets (30 mEq total) by mouth daily. 12/25/18   Evans Lance, MD  torsemide 40 MG TABS Take 40 mg by mouth 2 (two) times daily. 07/12/20 08/11/20  Manuella Ghazi, Pratik D, DO  vitamin C (ASCORBIC ACID) 500 MG tablet Take 1,000 mg by mouth every morning. 06/21/20  [provider]  warfarin (COUMADIN) 1 MG tablet TAKE TWO (2) TABLETS BY MOUTH EVERY DAY OR USE AS DIRECTED 06/29/20   Jettie Booze, MD  zinc gluconate 50 MG tablet Take 50 mg by mouth daily. 06/22/20 07/22/20  [provider]    Allergies    Benadryl [diphenhydramine hcl]  Review of Systems   Review of Systems  Gastrointestinal:  Negative for abdominal pain.  Neurological:  Positive for weakness.  All other systems reviewed and are negative.  Physical Exam Updated Vital Signs BP 123/72   Pulse 70   Temp 98.6 F (37 C) (Oral)   Resp 14   SpO2 98%   Physical Exam Vitals and nursing note reviewed.  Constitutional:      Appearance: He is well-developed.  HENT:     Head: Normocephalic.     Nose: Nose normal.     Mouth/Throat:     Mouth: Mucous membranes are moist.  Eyes:     Pupils: Pupils are equal, round, and reactive to light.  Cardiovascular:     Rate and Rhythm: Normal rate and regular rhythm.     Pulses: Normal pulses.  Pulmonary:     Effort: Pulmonary effort is normal.  Abdominal:     General: Abdomen is flat. There is no distension.  Musculoskeletal:        General: Normal range of motion.     Cervical back: Normal range of motion.  Skin:    General: Skin is warm.  Neurological:     Mental Status: He is alert and oriented to person, place, and time.  Psychiatric:        Mood and Affect: Mood normal.    ED Results /  Procedures / Treatments   Labs (all labs ordered are listed, but only abnormal results are displayed) Labs Reviewed  CBC WITH DIFFERENTIAL/PLATELET - Abnormal; Notable for the following components:      Result Value   RBC 3.65 (*)    Hemoglobin 11.9 (*)    HCT 37.8 (*)    MCV 103.6 (*)    RDW 15.9 (*)    Platelets 143 (*)    Neutro Abs 8.1 (*)    Lymphs Abs 0.5 (*)    Abs Immature Granulocytes 0.11 (*)    All other components within normal limits  COMPREHENSIVE METABOLIC PANEL - Abnormal; Notable for the following components:   Chloride 95 (*)    CO2 33 (*)    Glucose, Bld 147 (*)    BUN 26 (*)    Creatinine, Ser 1.45 (*)    Calcium 8.5 (*)    Total Protein 5.7 (*)    Albumin 2.8 (*)    GFR, Estimated 49 (*)    All other components within normal limits  PROTIME-INR - Abnormal; Notable for the following components:   Prothrombin Time 25.3 (*)    INR 2.3 (*)    All other components within normal limits    EKG EKG Interpretation  Date/Time:  Monday July 17 2020 17:30:21 EDT Ventricular Rate:  70 PR Interval:    QRS Duration: 122 QT Interval:  398 QTC Calculation: 430 R Axis:   244 Text Interpretation: AV PACED RHYTHM No further analysis attempted due to paced rhythm Confirmed by Fredia Sorrow [phone]) on 07/17/2020 5:40:40 PM  Radiology DG Chest Port 1 View  Result Date: 07/17/2020 CLINICAL DATA:  Weakness.  Chills. EXAM: PORTABLE CHEST 1 VIEW COMPARISON:  07/07/2020 FINDINGS: Cardiomegaly. Pacemaker defibrillator appears the same as  seen previously. Aortic atherosclerotic calcification. Small pleural effusion on the right as seen previously. Mild venous hypertension without frank edema. IMPRESSION: Stable exam. Cardiomegaly. Small right effusion. Mild venous hypertension. Electronically Signed   By: Nelson Chimes M.D.   On: 07/17/2020 18:48    Procedures Procedures   Medications Ordered in ED Medications - No data to display  ED Course  I have reviewed the  triage vital signs and the nursing notes.  Pertinent labs & imaging results that were available during my care of the patient were reviewed by me and considered in my medical decision making (see chart for details).    MDM Rules/Calculators/A&P                          MDM:  chest xray reviewed, labs reviewed.  Socail wotk/pt and ot consulted  Final Clinical Impression(s) / ED Diagnoses Final diagnoses:  Weakness    Rx / DC Orders ED Discharge Orders     None        Sidney Ace 07/17/20 2205    Fransico Meadow, PA-C 07/17/20 2250    Fredia Sorrow, MD 07/20/20 [phone]

## 2020-07-17 NOTE — ED Triage Notes (Signed)
Pt from home via [facility]. Pt reports weakness and chills that started today. Pt reports being dx with COVID on 07/05/2020.

## 2020-07-18 MED ORDER — WARFARIN SODIUM 2 MG PO TABS
2.0000 mg | ORAL_TABLET | Freq: Once | ORAL | Status: AC
  Administered 2020-07-18: 2 mg via ORAL
  Filled 2020-07-18: qty 1

## 2020-07-18 MED ORDER — WARFARIN - PHARMACIST DOSING INPATIENT: Freq: Every day | Status: DC

## 2020-07-18 NOTE — ED Notes (Signed)
Family contact notified of update

## 2020-07-18 NOTE — Plan of Care (Signed)
  Problem: Acute Rehab PT Goals(only PT should resolve) Goal: Patient Will Transfer Sit To/From Stand Outcome: Progressing Flowsheets (Taken 07/18/2020 1150) Patient will transfer sit to/from stand: with supervision Goal: Pt Will Transfer Bed To Chair/Chair To Bed Outcome: Progressing Flowsheets (Taken 07/18/2020 1150) Pt will Transfer Bed to Chair/Chair to Bed: with supervision Goal: Pt Will Ambulate Outcome: Progressing Flowsheets (Taken 07/18/2020 1150) Pt will Ambulate:  75 feet  with min guard assist  with rolling walker Goal: Pt/caregiver will Perform Home Exercise Program Outcome: Progressing Flowsheets (Taken 07/18/2020 1150) Pt/caregiver will Perform Home Exercise Program:  For increased strengthening  For improved balance  With Supervision, verbal cues required/provided   Talbot Grumbling PT, DPT 07/18/20, 11:50 AM

## 2020-07-18 NOTE — ED Notes (Signed)
Patient refusing to have monitor kept on for VS.

## 2020-07-18 NOTE — Plan of Care (Signed)
  Problem: Acute Rehab OT Goals (only OT should resolve) Goal: Pt. Will Perform Grooming Flowsheets (Taken 07/18/2020 1201) Pt Will Perform Grooming:  with supervision  standing Goal: Pt. Will Perform Lower Body Dressing Flowsheets (Taken 07/18/2020 1201) Pt Will Perform Lower Body Dressing:  with supervision  sitting/lateral leans  sit to/from stand Goal: Pt. Will Transfer To Toilet Flowsheets (Taken 07/18/2020 1201) Pt Will Transfer to Toilet:  with supervision  ambulating  regular height toilet  bedside commode Goal: Pt. Will Perform Toileting-Clothing Manipulation Flowsheets (Taken 07/18/2020 1201) Pt Will Perform Toileting - Clothing Manipulation and hygiene:  with supervision  sit to/from stand  sitting/lateral leans Goal: Pt/Caregiver Will Perform Home Exercise Program Flowsheets (Taken 07/18/2020 1201) Pt/caregiver will Perform Home Exercise Program:  Increased strength  With Supervision  With written HEP provided

## 2020-07-18 NOTE — ED Notes (Signed)
Patient assisted back into bed at this time. Patient's external catheter came off and patient educated on urinal use. Patient complains on [facility] with exertion. Patient encouraged to do deep breathing exercises. Personal belongings in reach.

## 2020-07-18 NOTE — NC FL2 (Signed)
[place] LEVEL OF CARE SCREENING TOOL     IDENTIFICATION  Patient Name: Troy [name] Birthdate: 1940-09-05 Sex: male Admission Date (Current Location): 07/17/2020  [facility] and [place] Number:  [employer] and Address:  [place] [address] [place]      Provider Number: [id number]  Attending Physician Name and Address:  Default, Provider, MD  Relative Name and Phone Number:  Luigi, Stuckey (Niece)   [phone]    Current Level of Care: SNF Recommended Level of Care: [place] Prior Approval Number:    Date Approved/Denied: 09/28/14 PASRR Number: [id number]  Discharge Plan: SNF    Current Diagnoses: Patient Active Problem List   Diagnosis Date Noted   COVID-19    Secondary hypercoagulable state ([place]) 11/05/2019   Chronic respiratory failure with hypoxia ([place]) 01/31/2017   Acute on chronic systolic CHF (congestive heart failure) ([place]) 01/31/2017   Allergic rhinitis 12/05/2016   Hoarseness 11/04/2016   Controlled type 2 diabetes mellitus without complication, without long-term current use of insulin ([place]) 09/19/2016   High risk medications (not anticoagulants) long-term use 09/19/2016   History of ASCVD 09/19/2016   Mixed hyperlipidemia 09/19/2016   Sudden visual loss of left eye 09/19/2016   Lower GI bleed 09/11/2015   Bright red blood per rectum 09/11/2015   BPH (benign prostatic hyperplasia) 12/25/2014   FTT (failure to thrive) in adult 12/25/2014   Encounter for therapeutic drug monitoring 08/05/2014   Pulmonary nodules 12/23/2013   Hemoptysis    Dyspnea    Acute on chronic congestive heart failure ([place]) 11/21/2013   Chronic kidney disease, stage 3 (HCC)    Coronary artery disease involving native coronary artery of native heart without angina pectoris    Paroxysmal atrial fibrillation (HCC)    Frequent PVCs    Hypoxia    COPD (chronic obstructive pulmonary disease) ([place])  06/16/2013   Lipoma of neck 09/11/2011   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 Yes; Fluid consistency: Thin)  AMBULATORY STATUS COMMUNICATION OF NEEDS Skin   Extensive Assist Verbally Other (Comment) (Ecchymosis)                       Personal Care Assistance Level of Assistance  Bathing, Feeding, Dressing Bathing Assistance: Maximum assistance Feeding assistance: Independent Dressing Assistance: Maximum assistance     Functional Limitations Info  Sight, Hearing, Speech Sight Info: Impaired Hearing Info: Adequate Speech Info: Adequate    SPECIAL CARE FACTORS FREQUENCY  PT (By licensed PT)     PT Frequency: 5x              Contractures Contractures Info: Not present    Additional Factors Info  Code Status, Allergies Code Status Info: Prior Allergies Info: Benadryl (diphenhydramine Hcl)           Current Medications (07/18/2020):  This is the current hospital active medication list Current Facility-Administered Medications  Medication Dose Route Frequency Provider Last Rate Last Admin   acetaminophen (  TYLENOL) tablet 1,000 mg  1,000 mg Oral Q6H PRN Fransico Meadow, PA-C   1,000 mg at 07/18/20 0951   albuterol (VENTOLIN HFA) 108 (90 Base) MCG/ACT inhaler 2 puff  2 puff Inhalation Q4H PRN Sofia, Leslie K, PA-C       alfuzosin (UROXATRAL) 24 hr tablet 10 mg  10 mg Oral Daily Delora Fuel, MD   10 mg at 07/18/20 0951   allopurinol (ZYLOPRIM) tablet 100 mg  100  mg Oral Daily Delora Fuel, MD   409 mg at 07/18/20 8119   ascorbic acid (VITAMIN C) tablet 1,000 mg  1,000 mg Oral q morning Delora Fuel, MD   1,478 mg at 07/18/20 2956   atorvastatin (LIPITOR) tablet 10 mg  10 mg Oral Daily Fransico Meadow, [place]   10 mg at 07/18/20 2130   carvedilol (COREG) tablet 12.5 mg  12.5 mg Oral BID WC Fransico Meadow, PA-C   12.5 mg at 07/18/20 8657   digoxin (LANOXIN) tablet 62.5 mcg  62.5 mcg Oral Daily Fransico Meadow, PA-C   62.5 mcg at 07/18/20 0953   fluticasone furoate-vilanterol (BREO ELLIPTA) 100-25 MCG/INH 1 puff  1 puff Inhalation Daily Fredia Sorrow, MD       potassium chloride SA (KLOR-CON) CR tablet 30 mEq  30 mEq Oral Daily Delora Fuel, MD   30 mEq at 07/18/20 0953   torsemide (DEMADEX) tablet 40 mg  40 mg Oral BID Sofia, Leslie K, PA-C   40 mg at 07/18/20 8469   umeclidinium bromide (INCRUSE ELLIPTA) 62.5 MCG/INH 1 puff  1 puff Inhalation Daily Fredia Sorrow, MD       warfarin (COUMADIN) tablet 2 mg  2 mg Oral ONCE-1600 Sofia, Leslie K, [place]       Warfarin - Pharmacist Dosing Inpatient   Does not apply q1600 Fransico Meadow, PA-C       Current Outpatient Medications  Medication Sig Dispense Refill   albuterol (ACCUNEB) 1.25 MG/3ML nebulizer solution INHALE CONTENTS OF 1 VIAL IN NEBULIZER EVERY 6 HOURS AS NEEDED. 360 mL 5   albuterol (VENTOLIN HFA) 108 (90 Base) MCG/ACT inhaler INHALE 1-2 PUFFS INTO THE LUNGS EVERY 6 HOURS AS NEEDED FOR WHEEZING OR SHORTNESS OF BREATH 8.5 g 5   alfuzosin (UROXATRAL) 10 MG 24 hr tablet Take 10 mg by mouth daily.     allopurinol (ZYLOPRIM) 100 MG tablet Take 1 tablet (100 mg total) daily by mouth. 90 tablet 0   atorvastatin (LIPITOR) 10 MG tablet TAKE ONE TABLET BY MOUTH DAILY 30 tablet 11   benzonatate (TESSALON) 100 MG capsule Take 1 capsule (100 mg total) by mouth every 6 (six) hours as needed for cough. 30 capsule 1   carvedilol (COREG) 12.5 MG tablet TAKE ONE TABLET BY MOUTH TWICE DAILY. TAKE WITH A MEAL. 180  tablet 1   cetirizine (ZYRTEC) 10 MG tablet Take 10 mg by mouth daily.     Cholecalciferol 25 MCG (1000 UT) tablet Take 1 tablet by mouth daily.     digoxin (LANOXIN) 0.125 MG tablet TAKE 1/2 TABLET BY MOUTH DAILY 45 tablet 2   fluticasone (FLONASE) 50 MCG/ACT nasal spray Place 2 sprays into both nostrils at bedtime as needed for allergies. 16 g 3   Fluticasone-Umeclidin-Vilant (TRELEGY ELLIPTA) 100-62.5-25 MCG/INH AEPB INHALE ONE PUFF INTO THE LUNGS DAILY 60 each 1   guaiFENesin-dextromethorphan (ROBITUSSIN DM) 100-10 MG/5ML syrup Take 15 mLs by mouth every 4 (four) hours as needed for cough. 118 mL 0   potassium  chloride SA (KLOR-CON) 20 MEQ tablet Take 1.5 tablets (30 mEq total) by mouth daily. 135 tablet 3   torsemide (DEMADEX) 20 MG tablet Take 40 mg by mouth 2 (two) times daily.     torsemide 40 MG TABS Take 40 mg by mouth 2 (two) times daily. 60 tablet 2   vitamin C (ASCORBIC ACID) 500 MG tablet Take 1,000 mg by mouth every morning.     warfarin (COUMADIN) 1 MG tablet TAKE TWO (2) TABLETS BY MOUTH EVERY DAY OR USE AS DIRECTED 65 tablet 3   zinc gluconate 50 MG tablet Take 50 mg by mouth daily.       Discharge Medications: Please see discharge summary for a list of discharge medications.  Relevant Imaging Results:  Relevant Lab Results:   Additional Information Pt SSN [id number]  Natasha Bence, LCSW

## 2020-07-18 NOTE — Evaluation (Signed)
Occupational Therapy Evaluation Patient Details Name: Troy [name] MRN: [id number] DOB: [birth year] Today's Date: 07/18/2020    History of Present Illness Troy [name] is an 80 yo male who presents with c/o being weak, having trouble walking at home. PMH: afib, CAD, COPD< CHF, diabetes, HTN, pacemaker placement (Simultaneous filing. User may not have seen previous data.)   Clinical Impression   Pt agreeable to OT evaluation, seen as co-evaluation with PT. Pt reports aide at home but he let her go because she wasn't helping him. Pt on 3L O2 at home, reports he is completing daily tasks independently  but fatigues easily. During evaluation pt SpO2 fluctuating and rest breaks required intermittently. Pt completing bedside tasks with RW, no difficulty using BUE for ADLs. Recommend SNF on discharge to improve independence and safety in ADLs, functional mobility, and for education on energy conservation.     Follow Up Recommendations  SNF    Equipment Recommendations  None recommended by OT       Precautions / Restrictions Precautions Precautions: Fall Restrictions Weight Bearing Restrictions: No      Mobility Bed Mobility Overal bed mobility: Modified Independent (Simultaneous filing. User may not have seen previous data.)             General bed mobility comments: HOB elevated    Transfers Overall transfer level: Needs assistance Equipment used: Rolling walker (2 wheeled) Transfers: Sit to/from [facility] Sit to Stand: Min guard;Min assist Stand pivot transfers: Min assist       General transfer comment: min guard from elevated surface, min A from low seated chair to power to stand, reliant on UE to power up        ADL either performed or assessed with clinical judgement   ADL Overall ADL's : Needs assistance/impaired Eating/Feeding: Modified independent;Sitting   Grooming: Wash/dry hands;Wash/dry face;Min guard;Standing   Upper Body  Bathing: Supervision/ safety;Sitting   Lower Body Bathing: Moderate assistance;Sitting/lateral leans;Sit to/from stand   Upper Body Dressing : Modified independent;Sitting   Lower Body Dressing: Moderate assistance;Sitting/lateral leans Lower Body Dressing Details (indicate cue type and reason): Assist with LB dressing due to sitting balance deficits when bending foward Toilet Transfer: Min guard;Ambulation;Regular Toilet;RW   Toileting- Water quality scientist and Hygiene: Min guard;Sitting/lateral lean;Sit to/from stand       Functional mobility during ADLs: Min guard;Rolling walker General ADL Comments: Pt requiring increased time, rest breaks for ADLs. Balance deficits limiting independence     Vision Baseline Vision/History: No visual deficits              Pertinent Vitals/Pain Pain Assessment: No/denies pain (Simultaneous filing. User may not have seen previous data.)     Hand Dominance Right   Extremity/Trunk Assessment Upper Extremity Assessment Upper Extremity Assessment: Defer to OT evaluation (Simultaneous filing. User may not have seen previous data.) LUE Deficits / Details: hx of shoulder weakness for years per pt reports   Lower Extremity Assessment Lower Extremity Assessment: Generalized weakness (AROM WNL, strength grossly 4/5  Simultaneous filing. User may not have seen previous data.)   Cervical / Trunk Assessment Cervical / Trunk Assessment: Kyphotic (Simultaneous filing. User may not have seen previous data.)   Communication Communication Communication: No difficulties   Cognition Arousal/Alertness: Awake/alert Behavior During Therapy: WFL for tasks assessed/performed Overall Cognitive Status: Within Functional Limits for tasks assessed  Home Living Family/patient expects to be discharged to:: Private residence Living Arrangements: Alone Available Help at Discharge: Family;Personal  care attendant;Available PRN/intermittently Type of Home: Apartment Home Access: Elevator     Home Layout: Two level;Able to live on main level with bedroom/bathroom Alternate Level Stairs-Number of Steps: 10+   Bathroom Shower/Tub: Teacher, early years/pre: Handicapped height Bathroom Accessibility: Yes   Home Equipment: Environmental consultant - 2 wheels;Cane - single point          Prior Functioning/Environment Level of Independence: Independent with assistive device(s)        Comments: Pt reports independent prior to last hospitalization, since recent d/c pt very fatigued requiring rest breaks and aide completing household chores        OT Problem List: Decreased strength;Decreased range of motion;Decreased activity tolerance;Impaired balance (sitting and/or standing);Decreased safety awareness;Decreased knowledge of use of DME or AE;Cardiopulmonary status limiting activity      OT Treatment/Interventions: Self-care/ADL training;Therapeutic exercise;DME and/or AE instruction;Therapeutic activities;Patient/family education    OT Goals(Current goals can be found in the care plan section) Acute Rehab OT Goals Patient Stated Goal: rehab, then home with an aide OT Goal Formulation: With patient Time For Goal Achievement: 08-31-20 Potential to Achieve Goals: Good  OT Frequency: Min 1X/week   Barriers to D/C: Decreased caregiver support          Co-evaluation PT/OT/SLP Co-Evaluation/Treatment: Yes Reason for Co-Treatment: To address functional/ADL transfers;Complexity of the patient's impairments (multi-system involvement) PT goals addressed during session: Mobility/safety with mobility;Balance;Proper use of DME OT goals addressed during session: ADL's and self-care;Proper use of Adaptive equipment and DME      AM-PAC OT "6 Clicks" Daily Activity     Outcome Measure Help from another person eating meals?: None Help from another person taking care of personal grooming?: A  Little Help from another person toileting, which includes using toliet, bedpan, or urinal?: A Little Help from another person bathing (including washing, rinsing, drying)?: A Little Help from another person to put on and taking off regular upper body clothing?: None Help from another person to put on and taking off regular lower body clothing?: A Little 6 Click Score: 20   End of Session Equipment Utilized During Treatment: Rolling walker;Gait belt;Oxygen Nurse Communication: Mobility status;Other (comment) (pt sitting up in regular chair)  Activity Tolerance: Patient limited by fatigue Patient left: in chair;with call bell/phone within reach  OT Visit Diagnosis: Unsteadiness on feet (R26.81);Muscle weakness (generalized) (M62.81)                Time: 0156-1537 OT Time Calculation (min): 24 min Charges:  OT General Charges $OT Visit: 1 Visit OT Evaluation $OT Eval Low Complexity: [place], OTR/L  [phone] 07/18/2020, 11:59 AM

## 2020-07-18 NOTE — ED Notes (Signed)
Pt refuses vital signs notified the nurse

## 2020-07-18 NOTE — Therapy (Signed)
Physical Therapy Evaluation Patient Details Name: Troy [name] MRN: [id number] DOB: 05-19-1940 Today's Date: 07/18/2020   History of Present Illness  Troy [name] is an 80 yo male who presents with c/o being weak, having trouble walking at home. PMH: afib, CAD, COPD, CHF, diabetes, HTN, pacemaker placement  Clinical Impression  Pt admitted with above diagnosis. Pt reports being independent prior to last hospitalization, but went home and was too fatigued to mobilize around the home like baseline so returned to hospital. Pt currently requires min A to power to stand from regular height chair, min A to steady with standing marching at bedside using RW due to 3/4 dyspnea. Pt on 2L O2 with SpO2 >90%; difficulty reading initially so switched finger with improved reading. Pt agreeable to SNF prior to return home with aide from [facility] to assist PRN. Pt denies dizziness or pain throughout session, requests to stay in straight back chair at [facility], nursing notified pt in chair and of soiled linen at [place]. Pt currently with functional limitations due to the deficits listed below (see PT Problem List). Pt will benefit from skilled PT to increase their independence and safety with mobility to allow discharge to the venue listed below.       Follow Up Recommendations SNF    Equipment Recommendations  None recommended by PT    Recommendations for Other Services       Precautions / Restrictions Precautions Precautions: Fall Restrictions Weight Bearing Restrictions: No      Mobility  Bed Mobility Overal bed mobility: Modified Independent   General bed mobility comments: HOB elevated    Transfers Overall transfer level: Needs assistance Equipment used: Rolling walker (2 wheeled) Transfers: Sit to/from [facility] Sit to Stand: Min guard;Min assist Stand pivot transfers: Min assist    General transfer comment: min guard from elevated surface, min A from low seated chair to  power to stand, reliant on UE to power up  Ambulation/Gait Ambulation/Gait assistance: Min assist  Assistive device: Rolling walker (2 wheeled)  General Gait Details: pt performs standing marching with RW, 15 sec x2 bouts, requires standing rest break between; pt on 2L O2 with SpO2 >90% and dyspnea 3/4  Stairs            Wheelchair Mobility    Modified Rankin (Stroke Patients Only)       Balance Overall balance assessment: Needs assistance Sitting-balance support: Feet supported Sitting balance-[name]: Good     Standing balance support: Bilateral upper extremity supported;During functional activity Standing balance-[name]: Poor Standing balance comment: reliant on UE support          Pertinent Vitals/Pain Pain Assessment: No/denies pain    Home Living Family/patient expects to be discharged to:: Private residence Living Arrangements: Alone Available Help at Discharge: Family;Personal care attendant;Available PRN/intermittently Type of Home: Apartment Home Access: Elevator  Home Layout: Two level;Able to live on main level with bedroom/bathroom Home Equipment: Gilford Rile - 2 wheels;Cane - single point      Prior Function Level of Independence: Independent with assistive device(s)  Comments: Pt reports independent prior to last hospitalization, since recent d/c pt very fatigued requiring rest breaks and aide completing household chores     Hand Dominance   Dominant Hand: Right    Extremity/Trunk Assessment   Upper Extremity Assessment Upper Extremity Assessment: Defer to OT evaluation    Lower Extremity Assessment Lower Extremity Assessment: Generalized weakness (AROM WNL, strength grossly 4/5)    Cervical / Trunk Assessment Cervical /  Trunk Assessment: Kyphotic   Communication   Communication: No difficulties  Cognition Arousal/Alertness: Awake/alert Behavior During Therapy: WFL for tasks assessed/performed Overall Cognitive Status: Within  Functional Limits for tasks assessed               General Comments      Exercises     Assessment/Plan    PT Assessment Patient needs continued PT services  PT Problem List Decreased strength;Decreased activity tolerance;Decreased balance       PT Treatment Interventions DME instruction;Gait training;Functional mobility training;Therapeutic activities;Therapeutic exercise;Balance training;Neuromuscular re-education;Cognitive remediation;Patient/family education    PT Goals (Current goals can be found in the Care Plan section)  Acute Rehab PT Goals Patient Stated Goal: rehab, then home with an aide PT Goal Formulation: With patient Time For Goal Achievement: 2020-08-31 Potential to Achieve Goals: Good    Frequency Min 3X/week   Barriers to discharge        Co-evaluation PT/OT/SLP Co-Evaluation/Treatment: Yes Reason for Co-Treatment: To address functional/ADL transfers PT goals addressed during session: Mobility/safety with mobility;Balance;Proper use of DME         AM-PAC PT "6 Clicks" Mobility  Outcome Measure Help needed turning from your back to your side while in a flat bed without using bedrails?: None Help needed moving from lying on your back to sitting on the side of a flat bed without using bedrails?: None Help needed moving to and from a bed to a chair (including a wheelchair)?: A Little Help needed standing up from a chair using your arms (e.g., wheelchair or bedside chair)?: A Little Help needed to walk in hospital room?: A Lot Help needed climbing 3-5 steps with a railing? : A Lot 6 Click Score: 18    End of Session Equipment Utilized During Treatment: Gait belt;Oxygen Activity Tolerance: Patient tolerated treatment well Patient left: in chair;with call bell/phone within reach Nurse Communication: Mobility status;Other (comment) (pt in chair, linen soiled) PT Visit Diagnosis: Unsteadiness on feet (R26.81);Other abnormalities of gait and mobility  (R26.89);Muscle weakness (generalized) (M62.81)    Time: 4481-8563 PT Time Calculation (min) (ACUTE ONLY): 24 min   Charges:   PT Evaluation $PT Eval Low Complexity: 1 Low PT Treatments $Therapeutic Activity: 8-22 mins         Tori Cherylann Hobday PT, DPT 07/18/20, 11:45 AM

## 2020-07-18 NOTE — ED Notes (Signed)
Patient sitting in chair bedside bed with call bell within reach. Patient educated on call bell to get back into bed. Patient verbalized understanding at this time.

## 2020-07-18 NOTE — ED Notes (Signed)
Patient complaining of [facility] and probe on ear applied at this time. Patient oxygen saturation 96% on 4L/[place]. Patient has pursed lip breathing. Patient instructed on deep breathing exercises to alleviate anxiety and SHOB.

## 2020-07-18 NOTE — Progress Notes (Signed)
ANTICOAGULATION CONSULT NOTE - Pharmacy Consult for warfarin Indication: atrial fibrillation  Allergies  Allergen Reactions   Benadryl [Diphenhydramine Hcl] Nausea And Vomiting    Patient Measurements:    Vital Signs: BP: 133/66 (06/28 0958) Pulse Rate: 69 (06/28 0958)  Labs: Recent Labs    07/17/20 1723  HGB 11.9*  HCT 37.8*  PLT 143*  LABPROT 25.3*  INR 2.3*  CREATININE 1.45*     Estimated Creatinine Clearance: 36.7 mL/min (A) (by C-G formula based on SCr of 1.45 mg/dL (H)).   Medical History: Past Medical History:  Diagnosis Date   Atrial fibrillation ([place])    CAD (coronary artery disease) 1996   status post PCI of the RCA    Community acquired pneumonia 08/24/2015   Congestive heart failure, unspecified    COPD (chronic obstructive pulmonary disease) ([facility])    Pt on home O2 at night and PRN, unsure of date of diagnosis.   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  INR on admission is therapeutic at 2.3.  Home dose listed 2 mg daily.   Goal of Therapy:  INR 2-3 Monitor platelets by anticoagulation protocol: Yes   Plan:  Warfarin 2 mg x 1 dose. Monitor daily INR and s/s of bleeding.  Margot Ables, PharmD Clinical Pharmacist 07/18/2020 10:59 AM

## 2020-07-19 ENCOUNTER — Emergency Department (HOSPITAL_COMMUNITY): Payer: Medicare Other

## 2020-07-19 DIAGNOSIS — A4151 Sepsis due to Escherichia coli [E. coli]: Secondary | ICD-10-CM | POA: Diagnosis present

## 2020-07-19 DIAGNOSIS — R509 Fever, unspecified: Secondary | ICD-10-CM | POA: Diagnosis not present

## 2020-07-19 DIAGNOSIS — A498 Other bacterial infections of unspecified site: Secondary | ICD-10-CM | POA: Diagnosis not present

## 2020-07-19 DIAGNOSIS — R1311 Dysphagia, oral phase: Secondary | ICD-10-CM | POA: Diagnosis not present

## 2020-07-19 DIAGNOSIS — N1831 Chronic kidney disease, stage 3a: Secondary | ICD-10-CM | POA: Diagnosis present

## 2020-07-19 DIAGNOSIS — I2510 Atherosclerotic heart disease of native coronary artery without angina pectoris: Secondary | ICD-10-CM | POA: Diagnosis not present

## 2020-07-19 DIAGNOSIS — I130 Hypertensive heart and chronic kidney disease with heart failure and stage 1 through stage 4 chronic kidney disease, or unspecified chronic kidney disease: Secondary | ICD-10-CM | POA: Diagnosis present

## 2020-07-19 DIAGNOSIS — I429 Cardiomyopathy, unspecified: Secondary | ICD-10-CM | POA: Diagnosis not present

## 2020-07-19 DIAGNOSIS — R6520 Severe sepsis without septic shock: Secondary | ICD-10-CM | POA: Diagnosis present

## 2020-07-19 DIAGNOSIS — Z66 Do not resuscitate: Secondary | ICD-10-CM | POA: Diagnosis present

## 2020-07-19 DIAGNOSIS — N390 Urinary tract infection, site not specified: Secondary | ICD-10-CM | POA: Diagnosis not present

## 2020-07-19 DIAGNOSIS — M109 Gout, unspecified: Secondary | ICD-10-CM | POA: Diagnosis present

## 2020-07-19 DIAGNOSIS — I10 Essential (primary) hypertension: Secondary | ICD-10-CM | POA: Diagnosis not present

## 2020-07-19 DIAGNOSIS — E1122 Type 2 diabetes mellitus with diabetic chronic kidney disease: Secondary | ICD-10-CM | POA: Diagnosis present

## 2020-07-19 DIAGNOSIS — I517 Cardiomegaly: Secondary | ICD-10-CM | POA: Diagnosis not present

## 2020-07-19 DIAGNOSIS — M1990 Unspecified osteoarthritis, unspecified site: Secondary | ICD-10-CM | POA: Diagnosis present

## 2020-07-19 DIAGNOSIS — E785 Hyperlipidemia, unspecified: Secondary | ICD-10-CM | POA: Diagnosis not present

## 2020-07-19 DIAGNOSIS — Z950 Presence of cardiac pacemaker: Secondary | ICD-10-CM | POA: Diagnosis not present

## 2020-07-19 DIAGNOSIS — K219 Gastro-esophageal reflux disease without esophagitis: Secondary | ICD-10-CM | POA: Diagnosis not present

## 2020-07-19 DIAGNOSIS — J189 Pneumonia, unspecified organism: Secondary | ICD-10-CM | POA: Diagnosis present

## 2020-07-19 DIAGNOSIS — J441 Chronic obstructive pulmonary disease with (acute) exacerbation: Secondary | ICD-10-CM | POA: Diagnosis not present

## 2020-07-19 DIAGNOSIS — I420 Dilated cardiomyopathy: Secondary | ICD-10-CM | POA: Diagnosis present

## 2020-07-19 DIAGNOSIS — N179 Acute kidney failure, unspecified: Secondary | ICD-10-CM

## 2020-07-19 DIAGNOSIS — E860 Dehydration: Secondary | ICD-10-CM | POA: Diagnosis present

## 2020-07-19 DIAGNOSIS — Z1612 Extended spectrum beta lactamase (ESBL) resistance: Secondary | ICD-10-CM | POA: Diagnosis not present

## 2020-07-19 DIAGNOSIS — J440 Chronic obstructive pulmonary disease with acute lower respiratory infection: Secondary | ICD-10-CM | POA: Diagnosis present

## 2020-07-19 DIAGNOSIS — Z95810 Presence of automatic (implantable) cardiac defibrillator: Secondary | ICD-10-CM | POA: Diagnosis not present

## 2020-07-19 DIAGNOSIS — R531 Weakness: Secondary | ICD-10-CM | POA: Diagnosis present

## 2020-07-19 DIAGNOSIS — I480 Paroxysmal atrial fibrillation: Secondary | ICD-10-CM | POA: Diagnosis not present

## 2020-07-19 DIAGNOSIS — J9611 Chronic respiratory failure with hypoxia: Secondary | ICD-10-CM | POA: Diagnosis present

## 2020-07-19 DIAGNOSIS — I5023 Acute on chronic systolic (congestive) heart failure: Secondary | ICD-10-CM | POA: Diagnosis present

## 2020-07-19 DIAGNOSIS — Z8616 Personal history of COVID-19: Secondary | ICD-10-CM | POA: Diagnosis not present

## 2020-07-19 DIAGNOSIS — R279 Unspecified lack of coordination: Secondary | ICD-10-CM | POA: Diagnosis not present

## 2020-07-19 DIAGNOSIS — L89159 Pressure ulcer of sacral region, unspecified stage: Secondary | ICD-10-CM | POA: Diagnosis present

## 2020-07-19 DIAGNOSIS — M6281 Muscle weakness (generalized): Secondary | ICD-10-CM | POA: Diagnosis not present

## 2020-07-19 DIAGNOSIS — I482 Chronic atrial fibrillation, unspecified: Secondary | ICD-10-CM | POA: Diagnosis present

## 2020-07-19 DIAGNOSIS — I255 Ischemic cardiomyopathy: Secondary | ICD-10-CM | POA: Diagnosis present

## 2020-07-19 DIAGNOSIS — Z86718 Personal history of other venous thrombosis and embolism: Secondary | ICD-10-CM | POA: Diagnosis not present

## 2020-07-19 DIAGNOSIS — J449 Chronic obstructive pulmonary disease, unspecified: Secondary | ICD-10-CM | POA: Diagnosis not present

## 2020-07-19 DIAGNOSIS — E119 Type 2 diabetes mellitus without complications: Secondary | ICD-10-CM | POA: Diagnosis not present

## 2020-07-19 DIAGNOSIS — I5022 Chronic systolic (congestive) heart failure: Secondary | ICD-10-CM | POA: Diagnosis not present

## 2020-07-19 DIAGNOSIS — L8990 Pressure ulcer of unspecified site, unspecified stage: Secondary | ICD-10-CM | POA: Diagnosis present

## 2020-07-19 DIAGNOSIS — R2689 Other abnormalities of gait and mobility: Secondary | ICD-10-CM | POA: Diagnosis not present

## 2020-07-19 LAB — URINALYSIS, ROUTINE W REFLEX MICROSCOPIC
Bilirubin Urine: NEGATIVE
Glucose, UA: NEGATIVE mg/dL
Ketones, ur: NEGATIVE mg/dL
Nitrite: NEGATIVE
Protein, ur: 100 mg/dL — AB
RBC / HPF: >50 RBC/hpf — ABNORMAL HIGH (ref 0–5)
Specific Gravity, Urine: 1.013 (ref 1.005–1.030)
WBC, UA: >50 WBC/hpf — ABNORMAL HIGH (ref 0–5)
pH: 6 (ref 5.0–8.0)

## 2020-07-19 LAB — CBC WITH DIFFERENTIAL/PLATELET
Band Neutrophils: 15 %
Basophils Absolute: 0 10*3/uL (ref 0.0–0.1)
Basophils Relative: 0 %
Eosinophils Absolute: 0.1 10*3/uL (ref 0.0–0.5)
Eosinophils Relative: 1 %
HCT: 39.9 % (ref 39.0–52.0)
Hemoglobin: 12.5 g/dL — ABNORMAL LOW (ref 13.0–17.0)
Lymphocytes Relative: 6 %
Lymphs Abs: 0.6 10*3/uL — ABNORMAL LOW (ref 0.7–4.0)
MCH: 32.8 pg (ref 26.0–34.0)
MCHC: 31.3 g/dL (ref 30.0–36.0)
MCV: 104.7 fL — ABNORMAL HIGH (ref 80.0–100.0)
Metamyelocytes Relative: 2 %
Monocytes Absolute: 0 10*3/uL — ABNORMAL LOW (ref 0.1–1.0)
Monocytes Relative: 0 %
Neutro Abs: 9.8 10*3/uL — ABNORMAL HIGH (ref 1.7–7.7)
Neutrophils Relative %: 76 %
Platelets: 122 10*3/uL — ABNORMAL LOW (ref 150–400)
RBC: 3.81 MIL/uL — ABNORMAL LOW (ref 4.22–5.81)
RDW: 15.9 % — ABNORMAL HIGH (ref 11.5–15.5)
WBC: 10.8 10*3/uL — ABNORMAL HIGH (ref 4.0–10.5)
nRBC: 0 % (ref 0.0–0.2)

## 2020-07-19 LAB — GASTROINTESTINAL PANEL BY PCR, STOOL (REPLACES STOOL CULTURE)

## 2020-07-19 LAB — BASIC METABOLIC PANEL
Anion gap: 11 (ref 5–15)
BUN: 33 mg/dL — ABNORMAL HIGH (ref 8–23)
CO2: 32 mmol/L (ref 22–32)
Calcium: 8.6 mg/dL — ABNORMAL LOW (ref 8.9–10.3)
Chloride: 93 mmol/L — ABNORMAL LOW (ref 98–111)
Creatinine, Ser: 1.78 mg/dL — ABNORMAL HIGH (ref 0.61–1.24)
GFR, Estimated: 38 mL/min — ABNORMAL LOW (ref >60)
Glucose, Bld: 152 mg/dL — ABNORMAL HIGH (ref 70–99)
Potassium: 4.9 mmol/L (ref 3.5–5.1)
Sodium: 136 mmol/L (ref 135–145)

## 2020-07-19 LAB — LACTIC ACID, PLASMA
Lactic Acid, Venous: 3.2 mmol/L (ref 0.5–1.9)
Lactic Acid, Venous: 1.6 mmol/L (ref 0.5–1.9)

## 2020-07-19 LAB — GLUCOSE, CAPILLARY
Glucose-Capillary: 126 mg/dL — ABNORMAL HIGH (ref 70–99)
Glucose-Capillary: 155 mg/dL — ABNORMAL HIGH (ref 70–99)

## 2020-07-19 LAB — BLOOD GAS, VENOUS
Acid-Base Excess: 9.2 mmol/L — ABNORMAL HIGH (ref 0.0–2.0)
Bicarbonate: 30.3 mmol/L — ABNORMAL HIGH (ref 20.0–28.0)
FIO2: 36
O2 Saturation: 69.3 %
Patient temperature: 36.5
pCO2, Ven: 73.3 mmHg (ref 44.0–60.0)
pH, Ven: 7.305 (ref 7.250–7.430)
pO2, Ven: 42 mmHg (ref 32.0–45.0)

## 2020-07-19 LAB — PROTIME-INR
INR: 2.2 — ABNORMAL HIGH (ref 0.8–1.2)
Prothrombin Time: 24.6 seconds — ABNORMAL HIGH (ref 11.4–15.2)

## 2020-07-19 LAB — MRSA NEXT GEN BY PCR, NASAL: MRSA by PCR Next Gen: NOT DETECTED

## 2020-07-19 LAB — PROCALCITONIN: Procalcitonin: 9.86 ng/mL

## 2020-07-19 MED ORDER — CARVEDILOL 3.125 MG PO TABS
3.1250 mg | ORAL_TABLET | Freq: Two times a day (BID) | ORAL | Status: DC
  Administered 2020-07-20 – 2020-07-22 (×6): 3.125 mg via ORAL
  Filled 2020-07-19 (×6): qty 1

## 2020-07-19 MED ORDER — IBUPROFEN 400 MG PO TABS
600.0000 mg | ORAL_TABLET | Freq: Once | ORAL | Status: AC
  Administered 2020-07-19: 600 mg via ORAL
  Filled 2020-07-19: qty 2

## 2020-07-19 MED ORDER — WARFARIN SODIUM 2 MG PO TABS
2.0000 mg | ORAL_TABLET | Freq: Once | ORAL | Status: AC
  Administered 2020-07-19: 2 mg via ORAL
  Filled 2020-07-19 (×2): qty 1

## 2020-07-19 MED ORDER — SODIUM CHLORIDE 0.9 % IV SOLN
500.0000 mg | INTRAVENOUS | Status: AC
  Administered 2020-07-19 – 2020-07-20 (×2): 500 mg via INTRAVENOUS
  Filled 2020-07-19 (×2): qty 500

## 2020-07-19 MED ORDER — SODIUM CHLORIDE 0.9 % IV BOLUS
500.0000 mL | Freq: Once | INTRAVENOUS | Status: AC
  Administered 2020-07-19: 500 mL via INTRAVENOUS

## 2020-07-19 MED ORDER — VITAMIN D 25 MCG (1000 UNIT) PO TABS
1000.0000 [IU] | ORAL_TABLET | Freq: Every day | ORAL | Status: DC
  Administered 2020-07-19 – 2020-07-22 (×4): 1000 [IU] via ORAL
  Filled 2020-07-19 (×4): qty 1

## 2020-07-19 MED ORDER — SODIUM CHLORIDE 0.9 % IV SOLN
1.0000 g | Freq: Once | INTRAVENOUS | Status: AC
  Administered 2020-07-19: 1 g via INTRAVENOUS
  Filled 2020-07-19: qty 10

## 2020-07-19 MED ORDER — POLYETHYLENE GLYCOL 3350 17 G PO PACK: 17.0000 g | PACK | Freq: Every day | ORAL | Status: DC | PRN

## 2020-07-19 MED ORDER — ONDANSETRON HCL 4 MG PO TABS: 4.0000 mg | ORAL_TABLET | Freq: Four times a day (QID) | ORAL | Status: DC | PRN

## 2020-07-19 MED ORDER — BENZONATATE 100 MG PO CAPS: 100.0000 mg | ORAL_CAPSULE | Freq: Four times a day (QID) | ORAL | Status: DC | PRN

## 2020-07-19 MED ORDER — ACETAMINOPHEN 650 MG RE SUPP: 650.0000 mg | Freq: Four times a day (QID) | RECTAL | Status: DC | PRN

## 2020-07-19 MED ORDER — ACETAMINOPHEN 325 MG PO TABS
650.0000 mg | ORAL_TABLET | Freq: Four times a day (QID) | ORAL | Status: DC | PRN
  Administered 2020-07-20 – 2020-07-21 (×3): 650 mg via ORAL
  Filled 2020-07-19 (×3): qty 2

## 2020-07-19 MED ORDER — ALBUTEROL SULFATE HFA 108 (90 BASE) MCG/ACT IN AERS
2.0000 | INHALATION_SPRAY | RESPIRATORY_TRACT | Status: DC | PRN
  Administered 2020-07-19: 2 via RESPIRATORY_TRACT

## 2020-07-19 MED ORDER — CHLORHEXIDINE GLUCONATE CLOTH 2 % EX PADS
6.0000 | MEDICATED_PAD | Freq: Every day | CUTANEOUS | Status: DC
  Administered 2020-07-19 – 2020-07-22 (×4): 6 via TOPICAL

## 2020-07-19 MED ORDER — SODIUM CHLORIDE 0.9 % IV SOLN
1.0000 g | INTRAVENOUS | Status: DC
  Administered 2020-07-19: 1 g via INTRAVENOUS
  Filled 2020-07-19: qty 10

## 2020-07-19 MED ORDER — OXYCODONE HCL 5 MG PO TABS: 5.0000 mg | ORAL_TABLET | ORAL | Status: DC | PRN

## 2020-07-19 MED ORDER — FLUCONAZOLE IN SODIUM CHLORIDE 200-0.9 MG/100ML-% IV SOLN
200.0000 mg | INTRAVENOUS | Status: AC
  Administered 2020-07-19 – 2020-07-21 (×3): 200 mg via INTRAVENOUS
  Filled 2020-07-19 (×3): qty 100

## 2020-07-19 MED ORDER — ONDANSETRON HCL 4 MG/2ML IJ SOLN
4.0000 mg | Freq: Four times a day (QID) | INTRAMUSCULAR | Status: DC | PRN
  Administered 2020-07-20: 4 mg via INTRAVENOUS
  Filled 2020-07-19: qty 2

## 2020-07-19 MED ORDER — FLUTICASONE PROPIONATE 50 MCG/ACT NA SUSP
2.0000 | Freq: Every evening | NASAL | Status: DC | PRN
Start: 2020-07-19 — End: 2020-07-23

## 2020-07-19 MED ORDER — GUAIFENESIN-DM 100-10 MG/5ML PO SYRP: 15.0000 mL | ORAL_SOLUTION | ORAL | Status: DC | PRN

## 2020-07-19 MED ORDER — SODIUM CHLORIDE 0.9 % IV SOLN: 2.0000 g | INTRAVENOUS | Status: DC

## 2020-07-19 MED ORDER — ZINC SULFATE 220 (50 ZN) MG PO CAPS
220.0000 mg | ORAL_CAPSULE | Freq: Every day | ORAL | Status: DC
  Administered 2020-07-19 – 2020-07-22 (×4): 220 mg via ORAL
  Filled 2020-07-19 (×4): qty 1

## 2020-07-19 NOTE — ED Notes (Addendum)
Pt had bowel movement with bedding changed, brief changed and pt cleaned. Noted skin breakdown starting to happen at the sacral area. Sacral pad placed on pt.  Pt felt warm to the touch so a rectal temp was done of 102.4. EDP made aware. Tylenol was given 1 hour previous to temperature check.  Will continue to monitor pt.

## 2020-07-19 NOTE — Progress Notes (Signed)
Patient seen and evaluated, chart reviewed, please see EMR for updated orders. Please see full H&P dictated by admitting physician Dr. Clearence Ped for same date of service.   Brief Summary:- 80 y.o. male, with history of atrial fibrillation, coronary artery disease, congestive heart failure, COPD, diabetes mellitus type 2, GERD, hypertension, ischemic dilated cardiomyopathy, history of DVT as well as recent COVID-19 infection diagnosed on 07/04/2020, recently discharged from this facility on 07/11/2020 not readmitted on 07/19/2020 with generalized weakness with blood cultures growing gram-negative rods  A/p 1)Gram-negative rod Severe sepsis suspect urinary source----patient met sepsis criteria on admission with a temperature of 102.4, respiratory of 31,, WBC of 10.8 -Lactic acid was 3.2 and patient had transient hypotension PCT 9.86 -Blood cultures from 07/19/2020 with gram-negative rods in anaerobic bottle -Change Rocephin to 2 g daily -UA also with yeast so add Diflucan -Continue IV fluids  2)AKI----acute kidney injury -on CKD 3A -Creatinine is up to 1.78 from a baseline of 1.0 recently suspended due to sepsis , renally adjust medications, avoid nephrotoxic agents / dehydration  / hypotension  3)HFrEF--patient with chronic systolic dysfunction CHF with EF in the 20 to 25% range -Be judicious with sepsis related IV fluids at this time -Coreg has been reduced to 3.125 mg twice daily from 12.5 mg twice daily due to soft BP -Reintroduce cardiac meds as BP improves -Hold torsemide obviously for now given sepsis with soft BP and AKI  4)DM2-last A1c 7.1 reflecting uncontrolled diabetes PTA, Use Novolog/Humalog Sliding scale insulin with Accu-Cheks/Fingersticks as ordered   5)PAFib/pacemaker in situ----- continue Coumadin for stroke prophylaxis, on digoxin and Coreg for rate control -A rate-control strategy has been pursued as he is not an ideal candidate for anti-arrhythmic therapy given his COPD  and renal issues.  6) chronic hypoxic respiratory failure due to underlying COPD--at baseline patient uses 3 L of oxygen via nasal cannula currently requiring 4 L -Continue bronchodilators -Hold off on steroids, no acute COPD exacerbation at this time  7)Recent COVID-19 infection--- tested positive on 07/04/2020, largely asymptomatic from [place] standpoint okay to discontinue isolation  8) social/ethics--- full code  9) generalized weakness and ambulatory dysfunction--- get PT eval  -- Total care time is 44 minutes  Patient seen and evaluated, chart reviewed, please see EMR for updated orders. Please see full H&P dictated by admitting physician Dr. Clearence Ped for same date of service.   Roxan Hockey, MD

## 2020-07-19 NOTE — ED Provider Notes (Signed)
  Physical Exam  BP 111/76   Pulse 72   Temp (!) 102.2 F (39 C) (Rectal)   Resp (!) 30   Ht 5\' 6"  (1.676 m)   Wt 76 kg   SpO2 99%   BMI 27.04 kg/m   Physical Exam Vitals and nursing note reviewed.  Constitutional:      General: He is not in acute distress.    Appearance: He is well-developed. He is not diaphoretic.  HENT:     Head: Normocephalic and atraumatic.  Cardiovascular:     Rate and Rhythm: Normal rate and regular rhythm.     Heart sounds: No murmur heard.   No friction rub.  Pulmonary:     Effort: Pulmonary effort is normal. No respiratory distress.     Breath sounds: Normal breath sounds. No wheezing or rales.  Abdominal:     General: Bowel sounds are normal. There is no distension.     Palpations: Abdomen is soft.     Tenderness: There is no abdominal tenderness.  Musculoskeletal:        General: Normal range of motion.     Cervical back: Normal range of motion and neck supple.  Skin:    General: Skin is warm and dry.  Neurological:     Mental Status: He is alert and oriented to person, place, and time.     Coordination: Coordination normal.    ED Course/Procedures     Procedures  MDM   Care assumed from previous provider.  Patient initially presented here 1-1/2 days ago with complaints of weakness for which no definitive cause was found.  Patient was to go to rehab and arrangements were being made for this.  Patient has been held in the ER until this takes place.  This evening he had a loose bowel movement and complained of worsening weakness.  He was found to be febrile with a temp of 102.4.  I reevaluated the patient and initiated a febrile work-up.  He does have a lactate of 3.2 and findings consistent with a urinary tract infection.  He will be given Rocephin.  Cultures of blood and urine are pending.  As the patient's condition has changed, I feel as though admission is in his best interest.  I have spoken with Dr. Clearence Ped who agrees to  admit.       Veryl Speak, MD 07/19/20 [phone]

## 2020-07-19 NOTE — ED Notes (Signed)
Pt sitting up in bed, pt denies pain, pt on cardiac and O2 sat monitor, pt satting mid to high 90s on 4L via [place], pt appears to be in a paced rhythm, pt has some pursed lip breathing, resps are even and unlabored, bp is 87/48, md notified, holding coreg at this time.

## 2020-07-19 NOTE — ED Notes (Signed)
Blood cultures drawn before administration of antibotics

## 2020-07-19 NOTE — ED Notes (Signed)
Pillow placed under pt left side to help take pressure off of sacral area. Will assess and reposition. Pt states there is some relief of pain.

## 2020-07-19 NOTE — H&P (Signed)
TRH H&P    Patient Demographics:    Troy [name], is a 80 y.o. male  MRN: [id number]  DOB - [birth year]  Admit Date - 07/17/2020  Referring MD/NP/PA: Stark Jock  Outpatient Primary MD for the patient is Patient, No Pcp Per (Inactive)  Patient coming from: Home  Chief complaint- Weakness   HPI:    Troy [name]  is a 80 y.o. male, with history of atrial fibrillation, coronary artery disease, congestive heart failure, COPD, diabetes mellitus type 2, GERD, hypertension, ischemic dilated cardiomyopathy, history of DVT, and more presents the ED with a chief complaint of weakness.  Patient has been in the ED for approximately 36 hours.  With there was an attempt to get him placement from the ED, but then patient developed a fever and AKI.  Patient reports that at home he was feeling extremely weak and short of breath.  He does wear oxygen at home with a baseline of 3 L nasal cannula.  He reports that at baseline he can walk across the room, but prior to coming in he could only take 2 steps before he felt completely exhausted.  He walks with a cane.  He denies any chest pain, palpitations.  He reports that he did not try to turn his oxygen up to see if it would help.  He thinks he has been eating and drinking normally and reports that he eats 2 meals a day.  First he says he has had no weight loss, then he says he has had some significant weight loss, so that is unclear.  He reports no dysuria, but does report a decrease in urine output.  He has had a cough that sometimes productive of clear sputum.  He has not had a fever before today.  He does report myalgias for which he is taken Tylenol at home, and it has helped.  Patient has no other complaints at this time.  Patient does report that he really needs help, has been in and out of the hospital frequently, feels weak at home.  He did have a positive COVID on June  15.  Patient was last discharged by Dr. Brigitte Pulse on June 21.  At that time he had presented for shortness of breath and worsening peripheral edema.  He was admitted with diagnosis of acute on chronic systolic congestive heart failure exacerbation and was diuresed during his hospitalization.  He was discharged with cardiology recommendation of torsemide 40 mg at home, and follow-up with cardiology in the office.  He was to be set up with home physical therapy as well.  At that time he was offered skilled nursing facility, but he refused the facility that was offered.  At this point he is concerned about safety for him to be at home, and he is looking for placement.  In the ED Temp as high as 102.4, heart rate 72, respiratory rate as high as 30, blood pressure 111/76, satting at 99% Tylenol given for fever No leukocytosis with a white blood cell count of 10.8, hemoglobin 12.5, platelets 122 Chemistry  panel reveals an AKI with a creatinine of 1.78 -on June 20 his creatinine was 1.0 Patient had 2 loose bowel movements in the ED so a GI panel was sent to the lab Blood culture pending Chest x-ray today shows mild bibasilar atelectasis versus infiltrate -with the fever and patient admitting to cough we will err on the side of infiltrate UA is indicative of UTI Urine culture pending Digoxin level 0.8 EKG shows A. fib rate controlled Warfarin per pharmacy consult was ordered Patient's home meds of Coreg, Uroxatrol, allopurinol, vitamin C, Lipitor, digoxin, potassium, ibuprofen, order continued Normal saline 500 mL bolus given Admission requested as patient has developed this fever in the ER and now has an AKI as well, so may benefit from hospitalist management    Review of systems:    In addition to the HPI above,  Fever today No Headache, No changes with Vision or hearing, No problems swallowing food or Liquids, No Chest pain, admits to shortness of breath and cough No Abdominal pain, No Nausea or  Vomiting, bowel movements are regular, No Blood in stool or Urine, No dysuria, Early stage pressure wound to sacrum Admits to myalgias No new asymmetric weakness, tingling, numbness in any extremity, No polyuria, polydypsia or polyphagia, No significant Mental Stressors.  All other systems reviewed and are negative.    Past History of the following :    Past Medical History:  Diagnosis Date   Atrial fibrillation ([facility])    CAD (coronary artery disease) 1996   status post PCI of the RCA    Community acquired pneumonia 08/24/2015   Congestive heart failure, unspecified    COPD (chronic obstructive pulmonary disease) (HCC)    Pt on home O2 at night and PRN, unsure of date of diagnosis.   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HCC)    Nephrolithiasis    Other primary cardiomyopathies    Personal history of DVT (deep vein thrombosis)    Prostatitis    Shingles       Past Surgical History:  Procedure Laterality Date   BACK SURGERY     BIV ICD GENERATOR CHANGEOUT N/A 12/14/2018   Procedure: BIV ICD GENERATOR CHANGEOUT;  Surgeon: Evans Lance, MD;  Location: [facility];  Service: Cardiovascular;  Laterality: N/A;   CARDIAC DEFIBRILLATOR PLACEMENT     CORONARY STENT PLACEMENT     PACEMAKER INSERTION        Social History:      Social History   Tobacco Use   Smoking status: Former    Packs/day: 4.00    Years: 50.00    Pack years: 200.00    Types: Cigarettes    Quit date: 01/22/1980    Years since quitting: 40.5   Smokeless tobacco: Never  Substance Use Topics   Alcohol use: No    Alcohol/week: 0.0 standard drinks    Comment: denies       Family History :     Family History  Problem Relation Age of Onset   Heart disease Father    Cancer Father        LUNG   Colon cancer Mother    Stroke Paternal Uncle     Heart attack Neg Hx    Hyperlipidemia Neg Hx    Hypertension Neg Hx  Home Medications:   Prior to Admission medications   Medication Sig Start Date End Date Taking? Authorizing Provider  albuterol (ACCUNEB) 1.25 MG/3ML nebulizer solution INHALE CONTENTS OF 1 VIAL IN NEBULIZER EVERY 6 HOURS AS NEEDED. 10/29/19   Collene Gobble, MD  albuterol (VENTOLIN HFA) 108 (90 Base) MCG/ACT inhaler INHALE 1-2 PUFFS INTO THE LUNGS EVERY 6 HOURS AS NEEDED FOR WHEEZING OR SHORTNESS OF BREATH 01/11/20   Collene Gobble, MD  alfuzosin (UROXATRAL) 10 MG 24 hr tablet Take 10 mg by mouth daily. 02/23/19   [provider]  allopurinol (ZYLOPRIM) 100 MG tablet Take 1 tablet (100 mg total) daily by mouth. 11/29/16   Caren Macadam, MD  atorvastatin (LIPITOR) 10 MG tablet TAKE ONE TABLET BY MOUTH DAILY 07/16/19   Jettie Booze, MD  benzonatate (TESSALON) 100 MG capsule Take 1 capsule (100 mg total) by mouth every 6 (six) hours as needed for cough. 06/15/20   Collene Gobble, MD  carvedilol (COREG) 12.5 MG tablet TAKE ONE TABLET BY MOUTH TWICE DAILY. TAKE WITH A MEAL. 06/05/20   Jettie Booze, MD  cetirizine (ZYRTEC) 10 MG tablet Take 10 mg by mouth daily.    [provider]  Cholecalciferol 25 MCG (1000 UT) tablet Take 1 tablet by mouth daily. 06/22/20 09/20/20  [provider]  digoxin (LANOXIN) 0.125 MG tablet TAKE 1/2 TABLET BY MOUTH DAILY 05/11/20   Jettie Booze, MD  fluticasone [facility]) 50 MCG/ACT nasal spray Place 2 sprays into both nostrils at bedtime as needed for allergies. 10/22/16   Caren Macadam, MD  Fluticasone-Umeclidin-Vilant (TRELEGY ELLIPTA) 100-62.5-25 MCG/INH AEPB INHALE ONE PUFF INTO THE LUNGS DAILY 05/11/20   Collene Gobble, MD  guaiFENesin-dextromethorphan (ROBITUSSIN DM) 100-10 MG/5ML syrup Take 15 mLs by mouth every 4 (four) hours as needed for cough. 07/11/20   Manuella Ghazi, Pratik D, DO  potassium chloride SA (KLOR-CON) 20 MEQ tablet Take 1.5 tablets (30 mEq  total) by mouth daily. 12/25/18   Evans Lance, MD  torsemide (DEMADEX) 20 MG tablet Take 40 mg by mouth 2 (two) times daily. 07/11/20   [provider]  torsemide 40 MG TABS Take 40 mg by mouth 2 (two) times daily. 07/12/20 08/11/20  Manuella Ghazi, Pratik D, DO  vitamin C (ASCORBIC ACID) 500 MG tablet Take 1,000 mg by mouth every morning. 06/21/20   [provider]  warfarin (COUMADIN) 1 MG tablet TAKE TWO (2) TABLETS BY MOUTH EVERY DAY OR USE AS DIRECTED 06/29/20   Jettie Booze, MD  zinc gluconate 50 MG tablet Take 50 mg by mouth daily. 06/22/20 07/22/20  [provider]     Allergies:     Allergies  Allergen Reactions   Benadryl [Diphenhydramine Hcl] Nausea And Vomiting     Physical Exam:   Vitals  Blood pressure (!) 109/50, pulse 70, temperature 98 F (36.7 C), temperature source Oral, resp. rate 19, height 5\' 6"  (1.676 m), weight 76 kg, SpO2 100 %.  1.  General: Patient lying supine in bed,  no acute distress   2. Psychiatric: Alert and oriented x 3, mood and behavior normal for situation, pleasant and cooperative with exam   3. Neurologic: Speech and language are normal, no facial droop, left-sided lazy eye, moves all 4 extremities voluntarily, at baseline without acute deficits on limited exam   4. HEENMT:  Head is atraumatic, normocephalic, neck is supple, trachea is midline, mucous membranes are moist   5. Respiratory : Lungs are clear to auscultation  bilaterally without wheezing, rhonchi, rales, no cyanosis, speaking in full sentences but is using accessory muscles to breathe and slightly tachypneic   6. Cardiovascular : Heart rate normal, rhythm is regular, no murmurs, rubs or gallops, peripheral pulses palpated   7. Gastrointestinal:  Abdomen is soft, nondistended, nontender to palpation bowel sounds active, no masses or organomegaly palpated   8. Skin:  Skin is warm, dry and intact without rashes, acute lesions, some erythema over sacrum,  sacral pad applied  9.Musculoskeletal:  No acute deformities or trauma, no asymmetry in tone, peripheral pulses palpated, no tenderness to palpation in the extremities    Data Review:    CBC Recent Labs  Lab 07/17/20 1723 07/19/20 0430  WBC 9.3 10.8*  HGB 11.9* 12.5*  HCT 37.8* 39.9  PLT 143* 122*  MCV 103.6* 104.7*  MCH 32.6 32.8  MCHC 31.5 31.3  RDW 15.9* 15.9*  LYMPHSABS 0.5* 0.6*  MONOABS 0.5 0.0*  EOSABS 0.1 0.1  BASOSABS 0.0 0.0   ------------------------------------------------------------------------------------------------------------------  Results for orders placed or performed during the hospital encounter of 07/17/20 (from the past 48 hour(s))  CBC with Differential/Platelet     Status: Abnormal   Collection Time: 07/17/20  5:23 PM  Result Value Ref Range   WBC 9.3 4.0 - 10.5 K/uL   RBC 3.65 (L) 4.22 - 5.81 MIL/uL   Hemoglobin 11.9 (L) 13.0 - 17.0 g/dL   HCT 37.8 (L) 39.0 - 52.0 %   MCV 103.6 (H) 80.0 - 100.0 fL   MCH 32.6 26.0 - 34.0 pg   MCHC 31.5 30.0 - 36.0 g/dL   RDW 15.9 (H) 11.5 - 15.5 %   Platelets 143 (L) 150 - 400 K/uL   nRBC 0.0 0.0 - 0.2 %   Neutrophils Relative % 88 %   Neutro Abs 8.1 (H) 1.7 - 7.7 K/uL   Lymphocytes Relative 5 %   Lymphs Abs 0.5 (L) 0.7 - 4.0 K/uL   Monocytes Relative 5 %   Monocytes Absolute 0.5 0.1 - 1.0 K/uL   Eosinophils Relative 1 %   Eosinophils Absolute 0.1 0.0 - 0.5 K/uL   Basophils Relative 0 %   Basophils Absolute 0.0 0.0 - 0.1 K/uL   Immature Granulocytes 1 %   Abs Immature Granulocytes 0.11 (H) 0.00 - 0.07 K/uL    Comment: Performed at [facility], [address] [place], [place] [address]  Comprehensive metabolic panel     Status: Abnormal   Collection Time: 07/17/20  5:23 PM  Result Value Ref Range   Sodium 140 135 - 145 mmol/L   Potassium 3.9 3.5 - 5.1 mmol/L   Chloride 95 (L) 98 - 111 mmol/L   CO2 33 (H) 22 - 32 mmol/L   Glucose, Bld 147 (H) 70 - 99 mg/dL    Comment: Glucose reference range  applies only to samples taken after fasting for at least 8 hours.   BUN 26 (H) 8 - 23 mg/dL   Creatinine, Ser 1.45 (H) 0.61 - 1.24 mg/dL   Calcium 8.5 (L) 8.9 - 10.3 mg/dL   Total Protein 5.7 (L) 6.5 - 8.1 g/dL   Albumin 2.8 (L) 3.5 - 5.0 g/dL   AST 19 15 - 41 U/L   ALT 17 0 - 44 U/L   Alkaline Phosphatase 81 38 - 126 U/L   Total Bilirubin 1.0 0.3 - 1.2 mg/dL   GFR, Estimated 49 (L) >60 mL/min    Comment: (NOTE) Calculated using the CKD-EPI Creatinine Equation (2021)  Anion gap 12 5 - 15    Comment: Performed at [facility], [address] [place], [place] [address]  Protime-INR     Status: Abnormal   Collection Time: 07/17/20  5:23 PM  Result Value Ref Range   Prothrombin Time 25.3 (H) 11.4 - 15.2 seconds   INR 2.3 (H) 0.8 - 1.2    Comment: (NOTE) INR goal varies based on device and disease states. Performed at [facility], [address], [place], [place] [address]   Digoxin level     Status: None   Collection Time: 07/17/20 10:41 PM  Result Value Ref Range   Digoxin Level 0.8 0.8 - 2.0 ng/mL    Comment: Performed at [facility], [address], [place], [place] [address]  Protime-INR     Status: Abnormal   Collection Time: 07/19/20  4:06 AM  Result Value Ref Range   Prothrombin Time 24.6 (H) 11.4 - 15.2 seconds   INR 2.2 (H) 0.8 - 1.2    Comment: (NOTE) INR goal varies based on device and disease states. Performed at [facility], [address] [place], [place] [address]   Urinalysis, Routine w reflex microscopic     Status: Abnormal   Collection Time: 07/19/20  4:19 AM  Result Value Ref Range   Color, Urine YELLOW YELLOW   APPearance CLOUDY (A) CLEAR   Specific Gravity, Urine 1.013 1.005 - 1.030   pH 6.0 5.0 - 8.0   Glucose, UA NEGATIVE NEGATIVE mg/dL   Hgb urine dipstick LARGE (A) NEGATIVE   Bilirubin Urine NEGATIVE NEGATIVE   Ketones, ur NEGATIVE NEGATIVE mg/dL   Protein, ur 100 (A) NEGATIVE mg/dL   Nitrite NEGATIVE NEGATIVE   Leukocytes,Ua LARGE (A)  NEGATIVE   RBC / HPF >50 (H) 0 - 5 RBC/hpf   WBC, UA >50 (H) 0 - 5 WBC/hpf   Bacteria, UA FEW (A) NONE SEEN   Squamous Epithelial / LPF 0-5 0 - 5   WBC Clumps PRESENT    Budding Yeast PRESENT    Ca Oxalate Crys, UA PRESENT     Comment: Performed at [facility], [address], [place], [place] [address]  Basic metabolic panel     Status: Abnormal   Collection Time: 07/19/20  4:30 AM  Result Value Ref Range   Sodium 136 135 - 145 mmol/L   Potassium 4.9 3.5 - 5.1 mmol/L    Comment: DELTA CHECK NOTED   Chloride 93 (L) 98 - 111 mmol/L   CO2 32 22 - 32 mmol/L   Glucose, Bld 152 (H) 70 - 99 mg/dL    Comment: Glucose reference range applies only to samples taken after fasting for at least 8 hours.   BUN 33 (H) 8 - 23 mg/dL   Creatinine, Ser 1.78 (H) 0.61 - 1.24 mg/dL   Calcium 8.6 (L) 8.9 - 10.3 mg/dL   GFR, Estimated 38 (L) >60 mL/min    Comment: (NOTE) Calculated using the CKD-EPI Creatinine Equation (2021)    Anion gap 11 5 - 15    Comment: Performed at [facility], [address] [place],  [address]  CBC with Differential     Status: Abnormal   Collection Time: 07/19/20  4:30 AM  Result Value Ref Range   WBC 10.8 (H) 4.0 - 10.5 K/uL   RBC 3.81 (L) 4.22 - 5.81 MIL/uL   Hemoglobin 12.5 (L) 13.0 - 17.0 g/dL   HCT 39.9 39.0 - 52.0 %   MCV 104.7 (H) 80.0 -  100.0 fL   MCH 32.8 26.0 - 34.0 pg   MCHC 31.3 30.0 - 36.0 g/dL   RDW 15.9 (H) 11.5 - 15.5 %   Platelets 122 (L) 150 - 400 K/uL    Comment: SPECIMEN CHECKED FOR CLOTS PLATELET COUNT CONFIRMED BY SMEAR    nRBC 0.0 0.0 - 0.2 %   Neutrophils Relative % 76 %   Neutro Abs 9.8 (H) 1.7 - 7.7 K/uL   Band Neutrophils 15 %   Lymphocytes Relative 6 %   Lymphs Abs 0.6 (L) 0.7 - 4.0 K/uL   Monocytes Relative 0 %   Monocytes Absolute 0.0 (L) 0.1 - 1.0 K/uL   Eosinophils Relative 1 %   Eosinophils Absolute 0.1 0.0 - 0.5 K/uL   Basophils Relative 0 %   Basophils Absolute 0.0 0.0 - 0.1 K/uL   WBC Morphology MILD LEFT SHIFT  (1-5% METAS, OCC MYELO, OCC BANDS)     Comment: TOXIC GRANULATION BANDS PRESENT    Metamyelocytes Relative 2 %   Dohle Bodies PRESENT     Comment: Performed at [facility], [address] [place], [place] [address]  Lactic acid, plasma     Status: Abnormal   Collection Time: 07/19/20  4:30 AM  Result Value Ref Range   Lactic Acid, Venous 3.2 (HH) 0.5 - 1.9 mmol/L    Comment: CRITICAL RESULT CALLED TO, READ BACK BY AND VERIFIED WITH: THOMPSON,R AT 5:00AM ON 07/19/20 BY [facility] Performed at [facility], [address] [place], [place] [address]     Chemistries  Recent Labs  Lab 07/17/20 1723 07/19/20 0430  NA 140 136  K 3.9 4.9  CL 95* 93*  CO2 33* 32  GLUCOSE 147* 152*  BUN 26* 33*  CREATININE 1.45* 1.78*  CALCIUM 8.5* 8.6*  AST 19  --   ALT 17  --   ALKPHOS 81  --   BILITOT 1.0  --    ------------------------------------------------------------------------------------------------------------------  ------------------------------------------------------------------------------------------------------------------ GFR: Estimated Creatinine Clearance: 29.9 mL/min (A) (by C-G formula based on SCr of 1.78 mg/dL (H)). Liver Function Tests: Recent Labs  Lab 07/17/20 1723  AST 19  ALT 17  ALKPHOS 81  BILITOT 1.0  PROT 5.7*  ALBUMIN 2.8*   No results for input(s): LIPASE, AMYLASE in the last 168 hours. No results for input(s): AMMONIA in the last 168 hours. Coagulation Profile: Recent Labs  Lab 07/17/20 1723 07/19/20 0406  INR 2.3* 2.2*   Cardiac Enzymes: No results for input(s): CKTOTAL, CKMB, CKMBINDEX, TROPONINI in the last 168 hours. BNP (last 3 results) No results for input(s): PROBNP in the last 8760 hours. HbA1C: No results for input(s): HGBA1C in the last 72 hours. CBG: No results for input(s): GLUCAP in the last 168 hours. Lipid Profile: No results for input(s): CHOL, HDL, LDLCALC, TRIG, CHOLHDL, LDLDIRECT in the last 72 hours. Thyroid  Function Tests: No results for input(s): TSH, T4TOTAL, FREET4, T3FREE, THYROIDAB in the last 72 hours. Anemia Panel: No results for input(s): VITAMINB12, FOLATE, FERRITIN, TIBC, IRON, RETICCTPCT in the last 72 hours.  --------------------------------------------------------------------------------------------------------------- Urine analysis:    Component Value Date/Time   COLORURINE YELLOW 07/19/2020 0419   APPEARANCEUR CLOUDY (A) 07/19/2020 0419   LABSPEC 1.013 07/19/2020 0419   PHURINE 6.0 07/19/2020 0419   GLUCOSEU NEGATIVE 07/19/2020 0419   HGBUR LARGE (A) 07/19/2020 0419   BILIRUBINUR NEGATIVE 07/19/2020 0419   KETONESUR NEGATIVE 07/19/2020 0419   PROTEINUR 100 (A) 07/19/2020 0419   UROBILINOGEN 0.2 11/21/2013 1946   NITRITE NEGATIVE 07/19/2020 0419  LEUKOCYTESUR LARGE (A) 07/19/2020 0419      Imaging Results:    DG Chest Port 1 View  Result Date: 07/19/2020 CLINICAL DATA:  Fever, COPD EXAM: PORTABLE CHEST 1 VIEW COMPARISON:  07/17/2020 FINDINGS: The lungs are symmetrically well expanded. Mild bibasilar atelectasis or infiltrate. No pneumothorax or pleural effusion. Mild cardiomegaly is stable. Left subclavian pacemaker defibrillator is unchanged. The pulmonary vascularity is normal. IMPRESSION: Mild bibasilar atelectasis or infiltrate. Stable cardiomegaly. Electronically Signed   By: Fidela Salisbury MD   On: 07/19/2020 04:59   DG Chest Port 1 View  Result Date: 07/17/2020 CLINICAL DATA:  Weakness.  Chills. EXAM: PORTABLE CHEST 1 VIEW COMPARISON:  07/07/2020 FINDINGS: Cardiomegaly. Pacemaker defibrillator appears the same as seen previously. Aortic atherosclerotic calcification. Small pleural effusion on the right as seen previously. Mild venous hypertension without frank edema. IMPRESSION: Stable exam. Cardiomegaly. Small right effusion. Mild venous hypertension. Electronically Signed   By: Nelson Chimes M.D.   On: 07/17/2020 18:48       Assessment & Plan:    Principal  Problem:   AKI (acute kidney injury) ([place]) Active Problems:   Chronic atrial fibrillation ([place])   Controlled type 2 diabetes mellitus without complication, without long-term current use of insulin (HCC)   Chronic respiratory failure with hypoxia (HCC)   COVID-19   Pressure injury of skin   Acute lower UTI   AKI Clinically does not appear fluid overloaded, concern for overdiuresis given fluid restrictions in the hospital Avoid nephrotoxic agents when possible In the setting of CHF we will very gently hydrate -500 mL bolus ordered in the ED, will continue to offer p.o. fluids Continue to monitor UTI With fever 102.4, UA indicative of UTI Urine culture and blood culture pending Continue Rocephin Community-acquired pneumonia There is also new bibasilar atelectasis on chest x-ray when compared to the exam on the 27th With the fever and cough there is concern for pneumonia Continue Rocephin and Zithromax Sputum culture and urine antigens Blood culture pending COVID-positive on June 15 -post viral pneumonia? Continue to monitor COVID positive/chronic respiratory failure with hypoxia Still within window to continue COVID precautions Out of window for COVID medications Currently on baseline oxygen requirement Albuterol inhaler every 2 as needed Continue to monitor Pressure injury of the skin Erythema over sacrum Sacral pad applied Continue to monitor Chronic atrial fibrillation Continue digoxin Continue warfarin per pharmacy consult Diabetes mellitus type 2 June 20 hemoglobin A1c 7.1 Currently diet controlled Continue carb modified diet Monitor CBGs History of CHF Does not appear to currently be in exacerbation Continue Coreg and statin Monitor daily weights and intake and output Holding torsemide in the setting of overdiuresis Continue to monitor Need for placement Consult PT and OT Consult transition of care   DVT Prophylaxis-   warfarin- SCDs   AM Labs Ordered,  also please review Full Orders  Family Communication: No family at bedside Code Status: DNR  Admission status: Inpatient :The appropriate admission status for this patient is INPATIENT. Inpatient status is judged to be reasonable and necessary in order to provide the required intensity of service to ensure the patient's safety. The patient's presenting symptoms, physical exam findings, and initial radiographic and laboratory data in the context of their chronic comorbidities is felt to place them at high risk for further clinical deterioration. Furthermore, it is not anticipated that the patient will be medically stable for discharge from the hospital within 2 midnights of admission. The following factors support the admission status of inpatient.  The patient's presenting symptoms include weakness. The worrisome physical exam findings include fever. The initial radiographic and laboratory data are worrisome because of UTI, possible pneumonia. The chronic co-morbidities include CAD, CHF, A. fib.,  Diabetes mellitus type 2, chronic respiratory failure with hypoxia       * I certify that at the point of admission it is my clinical judgment that the patient will require inpatient hospital care spanning beyond 2 midnights from the point of admission due to high intensity of service, high risk for further deterioration and high frequency of surveillance required.*  Time spent in minutes : [place]

## 2020-07-19 NOTE — ED Notes (Signed)
Pt in bed with eyes closed, pt arouses easily to verbal stim, pt denies pain, pt oriented to place, self and month, states that today is Thursday, re oriented pt

## 2020-07-19 NOTE — ED Notes (Addendum)
Date and time results received: 07/19/20 0500   Test: Lactic Acid Critical Value: 3.2  Name of Provider Notified: DELO  Orders Received? Or Actions Taken?: NA

## 2020-07-19 NOTE — Progress Notes (Addendum)
ANTICOAGULATION CONSULT NOTE - Pharmacy Consult for warfarin Indication: atrial fibrillation  Allergies  Allergen Reactions   Benadryl [Diphenhydramine Hcl] Nausea And Vomiting    Patient Measurements: Height: 5\' 6"  (167.6 cm) Weight: 76 kg (167 lb 8.8 oz) IBW/kg (Calculated) : 63.8  Vital Signs: Temp: 97.8 F (36.6 C) (06/29 0729) Temp Source: Oral (06/29 0729) BP: 87/48 (06/29 0729) Pulse Rate: 70 (06/29 0729)  Labs: Recent Labs    07/17/20 1723 07/19/20 0406 07/19/20 0430  HGB 11.9*  --  12.5*  HCT 37.8*  --  39.9  PLT 143*  --  122*  LABPROT 25.3* 24.6*  --   INR 2.3* 2.2*  --   CREATININE 1.45*  --  1.78*     Estimated Creatinine Clearance: 29.9 mL/min (A) (by C-G formula based on SCr of 1.78 mg/dL (H)).   Medical History: Past Medical History:  Diagnosis Date   Atrial fibrillation ([place])    CAD (coronary artery disease) 1996   status post PCI of the RCA    Community acquired pneumonia 08/24/2015   Congestive heart failure, unspecified    COPD (chronic obstructive pulmonary disease) ([facility])    Pt on home O2 at night and PRN, unsure of date of diagnosis.   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HCC)    Nephrolithiasis    Other primary cardiomyopathies    Personal history of DVT (deep vein thrombosis)    Prostatitis    Shingles     Medications:  See med rec   Assessment: Pharmacy consulted to dose warfarin in patient with atrial fibrillation.  INR on admission is therapeutic at 2.3.  Home dose listed 2 mg daily. Patient on diflucan also with drug interaction and may result in increased INR.  INR = 2.2 today, therapeutic  Goal of Therapy:  INR 2-3 Monitor platelets by anticoagulation protocol: Yes   Plan:  Warfarin 2 mg x 1 dose today Monitor daily INR and s/s of bleeding.  Isac Sarna, BS [place],  [place] Clinical Pharmacist Pager [phone] 07/19/2020 8:37 AM

## 2020-07-20 ENCOUNTER — Encounter (HOSPITAL_COMMUNITY): Payer: Self-pay | Admitting: Family Medicine

## 2020-07-20 ENCOUNTER — Telehealth: Payer: Self-pay | Admitting: *Deleted

## 2020-07-20 DIAGNOSIS — A498 Other bacterial infections of unspecified site: Secondary | ICD-10-CM

## 2020-07-20 DIAGNOSIS — Z1612 Extended spectrum beta lactamase (ESBL) resistance: Secondary | ICD-10-CM

## 2020-07-20 DIAGNOSIS — B9629 Other Escherichia coli [E. coli] as the cause of diseases classified elsewhere: Secondary | ICD-10-CM | POA: Diagnosis present

## 2020-07-20 LAB — CBC
HCT: 40.4 % (ref 39.0–52.0)
Hemoglobin: 12.7 g/dL — ABNORMAL LOW (ref 13.0–17.0)
MCH: 32.8 pg (ref 26.0–34.0)
MCHC: 31.4 g/dL (ref 30.0–36.0)
MCV: 104.4 fL — ABNORMAL HIGH (ref 80.0–100.0)
Platelets: 102 10*3/uL — ABNORMAL LOW (ref 150–400)
RBC: 3.87 MIL/uL — ABNORMAL LOW (ref 4.22–5.81)
RDW: 15.9 % — ABNORMAL HIGH (ref 11.5–15.5)
WBC: 12.3 10*3/uL — ABNORMAL HIGH (ref 4.0–10.5)
nRBC: 0 % (ref 0.0–0.2)

## 2020-07-20 LAB — BLOOD CULTURE ID PANEL (REFLEXED) - BCID2

## 2020-07-20 LAB — PROTIME-INR
INR: 2.9 — ABNORMAL HIGH (ref 0.8–1.2)
Prothrombin Time: 30.2 seconds — ABNORMAL HIGH (ref 11.4–15.2)

## 2020-07-20 LAB — COMPREHENSIVE METABOLIC PANEL
ALT: 17 U/L (ref 0–44)
AST: 18 U/L (ref 15–41)
Albumin: 2.4 g/dL — ABNORMAL LOW (ref 3.5–5.0)
Alkaline Phosphatase: 88 U/L (ref 38–126)
Anion gap: 10 (ref 5–15)
BUN: 41 mg/dL — ABNORMAL HIGH (ref 8–23)
CO2: 31 mmol/L (ref 22–32)
Calcium: 8.4 mg/dL — ABNORMAL LOW (ref 8.9–10.3)
Chloride: 94 mmol/L — ABNORMAL LOW (ref 98–111)
Creatinine, Ser: 2.04 mg/dL — ABNORMAL HIGH (ref 0.61–1.24)
GFR, Estimated: 32 mL/min — ABNORMAL LOW (ref >60)
Glucose, Bld: 119 mg/dL — ABNORMAL HIGH (ref 70–99)
Potassium: 4.7 mmol/L (ref 3.5–5.1)
Sodium: 135 mmol/L (ref 135–145)
Total Bilirubin: 0.7 mg/dL (ref 0.3–1.2)
Total Protein: 5.3 g/dL — ABNORMAL LOW (ref 6.5–8.1)

## 2020-07-20 LAB — GLUCOSE, CAPILLARY
Glucose-Capillary: 99 mg/dL (ref 70–99)
Glucose-Capillary: 157 mg/dL — ABNORMAL HIGH (ref 70–99)
Glucose-Capillary: 164 mg/dL — ABNORMAL HIGH (ref 70–99)

## 2020-07-20 LAB — MAGNESIUM: Magnesium: 2 mg/dL (ref 1.7–2.4)

## 2020-07-20 MED ORDER — ALBUTEROL SULFATE (2.5 MG/3ML) 0.083% IN NEBU: 2.5000 mg | INHALATION_SOLUTION | Freq: Four times a day (QID) | RESPIRATORY_TRACT | Status: DC | PRN

## 2020-07-20 MED ORDER — SODIUM CHLORIDE 0.9 % IV SOLN: INTRAVENOUS | Status: DC

## 2020-07-20 MED ORDER — MELATONIN 3 MG PO TABS
3.0000 mg | ORAL_TABLET | Freq: Once | ORAL | Status: AC
  Administered 2020-07-20: 3 mg via ORAL
  Filled 2020-07-20: qty 1

## 2020-07-20 MED ORDER — SODIUM CHLORIDE 0.9 % IV SOLN
1.0000 g | Freq: Two times a day (BID) | INTRAVENOUS | Status: AC
  Administered 2020-07-20 – 2020-07-21 (×4): 1 g via INTRAVENOUS
  Filled 2020-07-20 (×4): qty 1

## 2020-07-20 NOTE — Progress Notes (Addendum)
Patient Demographics:    Troy [name], is a 80 y.o. male, DOB - 09-Feb-1940, IRW:[id number]  Admit date - 07/17/2020   Admitting Physician Rolla Plate, DO  Outpatient Primary MD for the patient is Patient, No Pcp Per (Inactive)  LOS - 1   Chief Complaint  Patient presents with   Weakness        Subjective:    Troy [name] today has no fevers, no emesis,  No chest pain,    -No further diarrhea -No increased shortness of breath, BP more stable  Assessment  & Plan :    Principal Problem:   Bacteremia and Sepsis due to ESBL-producing Escherichia coli Active Problems:   UTI due to extended-spectrum beta lactamase (ESBL) producing Escherichia coli   Chronic atrial fibrillation (HCC)   Controlled type 2 diabetes mellitus without complication, without long-term current use of insulin (HCC)   Chronic respiratory failure with hypoxia ([place])   COVID-19   AKI (acute kidney injury) ([place])   Pressure injury of skin   Acute lower UTI  Brief Summary:- 80 y.o. male, with history of atrial fibrillation, coronary artery disease, congestive heart failure, COPD, diabetes mellitus type 2, GERD, hypertension, ischemic dilated cardiomyopathy, history of DVT as well as recent COVID-19 infection diagnosed on 07/04/2020, recently discharged from this facility on 07/11/2020 not readmitted on 07/19/2020 with  ESBL E coli Sepsis from Urinary Source  A/p 1)ESBL E coli Sepsis from Urinary Source----patient met sepsis criteria on admission with a temperature of 102.4, respiratory of 31,, WBC of 10.8 -Lactic acid was 3.2 and patient had transient hypotension PCT 9.86 -Blood cultures and urine cultures from 07/19/2020 with ESBL E. Coli -Stop Rocephin to 2 g daily -Start IV meropenem on 07/20/2020 -Possible discharge to SNF on 07/22/2020 on IV Invanz -UA also with yeast -okay to complete Diflucan -Hemodynamics have  improved avoid over aggressive hydration given low EF -GI panel/stool culture negative  2)AKI----acute kidney injury -on CKD 3A -Worsening renal function secondary to UTI, transient hypotension and dehydration -Creatinine is up to 2.04 (was 1.45 on admission)  from a baseline of 1.0 recently suspended due to sepsis , renally adjust medications, avoid nephrotoxic agents / dehydration  / hypotension  3)HFrEF--patient with chronic systolic dysfunction CHF with EF in the 20 to 25% range -Be judicious with sepsis related IV fluids at this time -Coreg has been reduced to 3.125 mg twice daily from 12.5 mg twice daily due to soft BP -Reintroduce cardiac meds as BP improves -Hold torsemide obviously for now given sepsis with soft BP and AKI  4)DM2-last A1c 7.1 reflecting uncontrolled diabetes PTA, Use Novolog/Humalog Sliding scale insulin with Accu-Cheks/Fingersticks as ordered   5)PAFib/pacemaker in situ----- continue Coumadin for stroke prophylaxis, on digoxin and Coreg for rate control -A rate-control strategy has been pursued as he is not an ideal candidate for anti-arrhythmic therapy given his COPD and renal issues. -INR may be erratic in the setting of Diflucan use with the cytochrome P4 50 interaction with Coumadin  6) chronic hypoxic respiratory failure due to underlying COPD--at baseline patient uses 3 L of oxygen via nasal cannula currently requiring 4 L -Continue bronchodilators -Hold off on steroids, no acute COPD exacerbation at this time  7)Recent COVID-19 infection--- tested positive on  07/04/2020, largely asymptomatic from [place] standpoint okay to discontinue isolation  8)Social/ethics--- full code  9)Generalized weakness and Ambulatory Dysfunction--- PT eval appreciated recommends SNF rehab  Disposition/Need for in-Hospital Stay- patient unable to be discharged at this time due to ESBL E coli sepsis requiring IV antibiotics and AKI requiring IV fluids*  Status is:  Inpatient  Remains inpatient appropriate because: Please see disposition above  Disposition: The patient is from: Home              Anticipated d/c is to: SNF              Anticipated d/c date is: 2 days              Patient currently is not medically stable to d/c. Barriers: Not Clinically Stable-   Code Status :  -  Code Status: Full Code   Family Communication:    NA (patient is alert, awake and coherent) Na  Consults  :  NA  DVT Prophylaxis  :   - SCDs  SCDs Start: 07/19/20 6010    Lab Results  Component Value Date   PLT 102 (L) 07/20/2020    Inpatient Medications  Scheduled Meds:  alfuzosin  10 mg Oral Daily   vitamin C  1,000 mg Oral q morning   atorvastatin  10 mg Oral Daily   carvedilol  3.125 mg Oral BID WC   Chlorhexidine Gluconate Cloth  6 each Topical Daily   cholecalciferol  1,000 Units Oral Daily   digoxin  62.5 mcg Oral Daily   fluticasone furoate-vilanterol  1 puff Inhalation Daily   umeclidinium bromide  1 puff Inhalation Daily   Warfarin - Pharmacist Dosing Inpatient   Does not apply q1600   zinc sulfate  220 mg Oral Daily   Continuous Infusions:  sodium chloride 150 mL/hr at 07/20/20 0858   fluconazole (DIFLUCAN) IV 200 mg (07/20/20 0900)   meropenem (MERREM) IV 1 g (07/20/20 1051)   PRN Meds:.acetaminophen **OR** acetaminophen, albuterol, benzonatate, fluticasone, guaiFENesin-dextromethorphan, ondansetron **OR** ondansetron (ZOFRAN) IV, oxyCODONE, polyethylene glycol    Anti-infectives (From admission, onward)    Start     Dose/Rate Route Frequency Ordered Stop   07/20/20 1100  cefTRIAXone (ROCEPHIN) 2 g in sodium chloride 0.9 % 100 mL IVPB  Status:  Discontinued        2 g 200 mL/hr over 30 Minutes Intravenous Every 24 hours 07/19/20 1756 07/20/20 0802   07/20/20 0900  meropenem (MERREM) 1 g in sodium chloride 0.9 % 100 mL IVPB        1 g 200 mL/hr over 30 Minutes Intravenous Every 12 hours 07/20/20 0805     07/19/20 1900  cefTRIAXone  (ROCEPHIN) 1 g in sodium chloride 0.9 % 100 mL IVPB        1 g 200 mL/hr over 30 Minutes Intravenous  Once 07/19/20 1802 07/20/20 0405   07/19/20 0830  fluconazole (DIFLUCAN) IVPB 200 mg        200 mg 100 mL/hr over 60 Minutes Intravenous Every 24 hours 07/19/20 0825 07/22/20 0829   07/19/20 0545  cefTRIAXone (ROCEPHIN) 1 g in sodium chloride 0.9 % 100 mL IVPB  Status:  Discontinued        1 g 200 mL/hr over 30 Minutes Intravenous Every 24 hours 07/19/20 0544 07/19/20 1756   07/19/20 0545  azithromycin (ZITHROMAX) 500 mg in sodium chloride 0.9 % 250 mL IVPB        500 mg 250 mL/hr over 60  Minutes Intravenous Every 24 hours 07/19/20 0544 07/20/20 0619         Objective:   Vitals:   07/20/20 0743 07/20/20 0800 07/20/20 1100 07/20/20 1200  BP:  123/62  (!) 111/52  Pulse:  70 68 68  Resp:  (!) 23 (!) 26 (!) 22  Temp: (!) 97.5 F (36.4 C)   (!) 97.3 F (36.3 C)  TempSrc: Axillary   Axillary  SpO2:  100% 100% 100%  Weight:      Height:        Wt Readings from Last 3 Encounters:  07/20/20 80.4 kg  07/11/20 75.7 kg  03/07/20 84.7 kg    Intake/Output Summary (Last 24 hours) at 07/20/2020 1249 Last data filed at 07/19/2020 1600 Gross per 24 hour  Intake 360 ml  Output --  Net 360 ml   Physical Exam  Gen:- Awake Alert,  in no apparent distress, speaking in complete sentences HEENT:- [place].AT, No sclera icterus Neck-Supple Neck,No JVD,.  Lungs-improved air movement, no wheezing  CV- S1, S2 normal, regular  Abd-  +ve B.Sounds, Abd Soft, No tenderness,    Extremity/Skin:- No  edema, pedal pulses present  Psych-affect is appropriate, oriented x3 Neuro-generalized weakness, no new focal deficits, no tremors   Data Review:   Micro Results Recent Results (from the past 240 hour(s))  Urine culture     Status: Abnormal (Preliminary result)   Collection Time: 07/19/20  4:19 AM   Specimen: Urine, Clean Catch  Result Value Ref Range Status   Specimen Description   Final     URINE, CLEAN CATCH Performed at [facility], [address], [place], [place] [address]    Special Requests   Final    NONE Performed at [facility], [address], [place], [place] [address]    Culture (A)  Final    >=100,000 COLONIES/mL ESCHERICHIA COLI SUSCEPTIBILITIES TO FOLLOW Performed at [facility] [address], [place], [place] [address]    Report Status PENDING  Incomplete  Gastrointestinal Panel by PCR , Stool     Status: None   Collection Time: 07/19/20  4:20 AM   Specimen: Stool  Result Value Ref Range Status   Campylobacter species NOT DETECTED NOT DETECTED Final   Plesimonas shigelloides NOT DETECTED NOT DETECTED Final   Salmonella species NOT DETECTED NOT DETECTED Final   Yersinia enterocolitica NOT DETECTED NOT DETECTED Final   Vibrio species NOT DETECTED NOT DETECTED Final   Vibrio cholerae NOT DETECTED NOT DETECTED Final   Enteroaggregative E coli (EAEC) NOT DETECTED NOT DETECTED Final   Enteropathogenic E coli (EPEC) NOT DETECTED NOT DETECTED Final   Enterotoxigenic E coli (ETEC) NOT DETECTED NOT DETECTED Final   Shiga like toxin producing E coli (STEC) NOT DETECTED NOT DETECTED Final   Shigella/Enteroinvasive E coli (EIEC) NOT DETECTED NOT DETECTED Final   Cryptosporidium NOT DETECTED NOT DETECTED Final   Cyclospora cayetanensis NOT DETECTED NOT DETECTED Final   Entamoeba histolytica NOT DETECTED NOT DETECTED Final   Giardia lamblia NOT DETECTED NOT DETECTED Final   Adenovirus F40/41 NOT DETECTED NOT DETECTED Final   Astrovirus NOT DETECTED NOT DETECTED Final   Norovirus GI/GII NOT DETECTED NOT DETECTED Final   Rotavirus A NOT DETECTED NOT DETECTED Final   Sapovirus (I, II, IV, and V) NOT DETECTED NOT DETECTED Final    Comment: Performed at [facility], [address] [place], [place] [address]  Culture, blood (routine x 2)     Status: None (  Preliminary result)   Collection Time: 07/19/20  4:32 AM   Specimen: BLOOD RIGHT HAND   Result Value Ref Range Status   Specimen Description   Final    BLOOD RIGHT HAND Performed at [facility], [address], [place], [place] [address]    Special Requests   Final    BOTTLES DRAWN AEROBIC AND ANAEROBIC Blood Culture adequate volume Performed at [facility], [address] [place], [place] [address]    Culture  Setup Time   Final    GRAM NEGATIVE RODS ANAEROBIC BOTTLE ONLY Gram Stain Report Called to,Read Back By and Verified With: Lillie Fragmin, RN _0  07/19/2020 KAY GRAM NEGATIVE RODS AEROBIC BOTTLE ONLY Gram Stain Report Called to,Read Back By and Verified With: PREVIOUSLY CALLED TO BRITTANY FOLEY _1  07/19/2020 KAY Performed at [facility], [address], [place], [place] [address]    Culture GRAM NEGATIVE RODS  Final   Report Status PENDING  Incomplete  Culture, blood (routine x 2)     Status: Abnormal (Preliminary result)   Collection Time: 07/19/20  4:39 AM   Specimen: BLOOD  Result Value Ref Range Status   Specimen Description   Final    BLOOD LEFT ANTECUBITAL Performed at [facility], [address] [place], [place] [address]    Special Requests   Final    BOTTLES DRAWN AEROBIC AND ANAEROBIC Blood Culture adequate volume Performed at [facility] [address] [place], [place] [address]    Culture  Setup Time   Final    GRAM NEGATIVE RODS IN BOTH AEROBIC AND ANAEROBIC BOTTLES Gram Stain Report Called to,Read Back By and Verified With: BRITTANY FOLEY,RN _2  07/19/2020 KAY Gram Stain Report Called to,Read Back By and Verified With: PREVIOUSLY CALLED _3  07/19/2020 KAY CRITICAL RESULT CALLED TO, READ BACK BY AND VERIFIED WITH: C KINDLY RN 07/20/20 0134 JDW    Culture (A)  Final    ESCHERICHIA COLI SUSCEPTIBILITIES TO FOLLOW Performed at [facility], [place] [address] [place], [place] [address]    Report Status PENDING  Incomplete  Blood Culture ID Panel (Reflexed)     Status: Abnormal   Collection Time: 07/19/20  4:39 AM  Result  Value Ref Range Status   Enterococcus faecalis NOT DETECTED NOT DETECTED Final   Enterococcus Faecium NOT DETECTED NOT DETECTED Final   Listeria monocytogenes NOT DETECTED NOT DETECTED Final   Staphylococcus species NOT DETECTED NOT DETECTED Final   Staphylococcus aureus (BCID) NOT DETECTED NOT DETECTED Final   Staphylococcus epidermidis NOT DETECTED NOT DETECTED Final   Staphylococcus lugdunensis NOT DETECTED NOT DETECTED Final   Streptococcus species NOT DETECTED NOT DETECTED Final   Streptococcus agalactiae NOT DETECTED NOT DETECTED Final   Streptococcus pneumoniae NOT DETECTED NOT DETECTED Final   Streptococcus pyogenes NOT DETECTED NOT DETECTED Final   A.calcoaceticus-baumannii NOT DETECTED NOT DETECTED Final   Bacteroides fragilis NOT DETECTED NOT DETECTED Final   Enterobacterales DETECTED (A) NOT DETECTED Final    Comment: Enterobacterales represent a large order of gram negative bacteria, not a single organism. CRITICAL RESULT CALLED TO, READ BACK BY AND VERIFIED WITH: C KINDLEY RN 07/20/20 0134 JDW    Enterobacter cloacae complex NOT DETECTED NOT DETECTED Final   Escherichia coli DETECTED (A) NOT DETECTED Final    Comment: CRITICAL RESULT CALLED TO, READ BACK BY AND VERIFIED WITH: C KINDLEY RN 07/20/20 0134 JDW    Klebsiella aerogenes NOT DETECTED NOT DETECTED Final   Klebsiella oxytoca NOT DETECTED NOT DETECTED Final  Klebsiella pneumoniae NOT DETECTED NOT DETECTED Final   Proteus species NOT DETECTED NOT DETECTED Final   Salmonella species NOT DETECTED NOT DETECTED Final   Serratia marcescens NOT DETECTED NOT DETECTED Final   Haemophilus influenzae NOT DETECTED NOT DETECTED Final   Neisseria meningitidis NOT DETECTED NOT DETECTED Final   Pseudomonas aeruginosa NOT DETECTED NOT DETECTED Final   Stenotrophomonas maltophilia NOT DETECTED NOT DETECTED Final   Candida albicans NOT DETECTED NOT DETECTED Final   Candida auris NOT DETECTED NOT DETECTED Final   Candida glabrata  NOT DETECTED NOT DETECTED Final   Candida krusei NOT DETECTED NOT DETECTED Final   Candida parapsilosis NOT DETECTED NOT DETECTED Final   Candida tropicalis NOT DETECTED NOT DETECTED Final   Cryptococcus neoformans/gattii NOT DETECTED NOT DETECTED Final   CTX-M ESBL DETECTED (A) NOT DETECTED Final    Comment: CRITICAL RESULT CALLED TO, READ BACK BY AND VERIFIED WITH: C KINDLEY RN 07/20/20 0134 JDW (NOTE) Extended spectrum beta-lactamase detected. Recommend a carbapenem as initial therapy.      Carbapenem resistance IMP NOT DETECTED NOT DETECTED Final   Carbapenem resistance KPC NOT DETECTED NOT DETECTED Final   Carbapenem resistance NDM NOT DETECTED NOT DETECTED Final   Carbapenem resist OXA 48 LIKE NOT DETECTED NOT DETECTED Final   Carbapenem resistance VIM NOT DETECTED NOT DETECTED Final    Comment: Performed at [facility], [place] [address] [place], [place] [address]  MRSA Next Gen by PCR, Nasal     Status: None   Collection Time: 07/19/20 12:00 PM   Specimen: Nasal Mucosa; Nasal Swab  Result Value Ref Range Status   MRSA by PCR Next Gen NOT DETECTED NOT DETECTED Final    Comment: (NOTE) The GeneXpert MRSA Assay (FDA approved for NASAL specimens only), is one component of a comprehensive MRSA colonization surveillance program. It is not intended to diagnose MRSA infection nor to guide or monitor treatment for MRSA infections. Test performance is not FDA approved in patients less than 28 years old. Performed at [facility], [address] [place], [place] [address]     Radiology Reports DG Chest [place] 1 View  Result Date: 07/19/2020 CLINICAL DATA:  Fever, COPD EXAM: PORTABLE CHEST 1 VIEW COMPARISON:  07/17/2020 FINDINGS: The lungs are symmetrically well expanded. Mild bibasilar atelectasis or infiltrate. No pneumothorax or pleural effusion. Mild cardiomegaly is stable. Left subclavian pacemaker defibrillator is unchanged. The pulmonary vascularity is normal. IMPRESSION:  Mild bibasilar atelectasis or infiltrate. Stable cardiomegaly. Electronically Signed   By: Fidela Salisbury MD   On: 07/19/2020 04:59   DG Chest Port 1 View  Result Date: 07/17/2020 CLINICAL DATA:  Weakness.  Chills. EXAM: PORTABLE CHEST 1 VIEW COMPARISON:  07/07/2020 FINDINGS: Cardiomegaly. Pacemaker defibrillator appears the same as seen previously. Aortic atherosclerotic calcification. Small pleural effusion on the right as seen previously. Mild venous hypertension without frank edema. IMPRESSION: Stable exam. Cardiomegaly. Small right effusion. Mild venous hypertension. Electronically Signed   By: Nelson Chimes M.D.   On: 07/17/2020 18:48   DG Chest Port 1 View  Result Date: 07/07/2020 CLINICAL DATA:  Shortness of breath EXAM: PORTABLE CHEST 1 VIEW COMPARISON:  07/05/2020 FINDINGS: Persistent right greater than left pleural effusions with bibasilar atelectasis. Possible mild interstitial edema superimposed on chronic interstitial changes. Stable cardiomegaly. Left chest wall ICD. IMPRESSION: No substantial change. Persistent right greater than left pleural effusions with bibasilar atelectasis. Possible mild interstitial edema superimposed on chronic interstitial changes. Electronically Signed   By: Addison Lank.D.  On: 07/07/2020 16:25   DG Chest Port 1 View  Result Date: 07/05/2020 CLINICAL DATA:  Shortness of breath and leg swelling EXAM: PORTABLE CHEST 1 VIEW COMPARISON:  06/20/2020 FINDINGS: Cardiac shadow is enlarged but stable. Defibrillator is again noted. Aortic calcifications are seen. Vascular congestion has improved somewhat in the interval from the prior exam. Increasing right-sided pleural effusion is noted with underlying basilar infiltrate. IMPRESSION: Increasing right-sided effusion with underlying right basilar infiltrate. Previously seen vascular congestion has improved in the interval from the prior exam. Electronically Signed   By: Inez Catalina M.D.   On: 07/05/2020 11:23    ECHOCARDIOGRAM COMPLETE  Result Date: 07/07/2020    ECHOCARDIOGRAM REPORT   Patient Name:   Troy [name] Date of Exam: 07/07/2020 Medical Rec #:  [id number]         Height:       66.0 in Accession #:    [id number]        Weight:       179.5 lb Date of Birth:  [birth year]        BSA:          1.910 m Patient Age:    21 years          BP:           141/76 mmHg Patient Gender: M                 HR:           70 bpm. Exam Location:  [facility] Procedure: 2D Echo, Cardiac Doppler and Color Doppler Indications:    CHF  History:        Patient has prior history of Echocardiogram examinations, most                 recent 02/01/2017. CHF and Cardiomyopathy, CAD, Defibrillator,                 COPD, Arrythmias:Atrial Fibrillation, Signs/Symptoms:Shortness                 of Breath; Risk Factors:Dyslipidemia and LE edema. COVID+.  Sonographer:    Dustin Flock RDCS Referring Phys: [phone] DAVID TAT  Sonographer Comments: Image acquisition challenging due to COPD and Image acquisition challenging due to respiratory motion. IMPRESSIONS  1. Left ventricular ejection fraction, by estimation, is 20 to 25%. The left ventricle has severely decreased function. The left ventricle demonstrates global hypokinesis. There is mild left ventricular hypertrophy. Left ventricular diastolic parameters  are indeterminate.  2. Right ventricular systolic function is normal. The right ventricular size is normal.  3. Left atrial size was severely dilated.  4. Right atrial size was mild to moderately dilated.  5. The mitral valve is normal in structure. Mild mitral valve regurgitation. No evidence of mitral stenosis.  6. The aortic valve is tricuspid. There is mild calcification of the aortic valve. There is mild thickening of the aortic valve. Aortic valve regurgitation is mild.  7. The inferior vena cava is normal in size with greater than 50% respiratory variability, suggesting right atrial pressure of 3 mmHg. FINDINGS  Left Ventricle:  Left ventricular ejection fraction, by estimation, is 20 to 25%. The left ventricle has severely decreased function. The left ventricle demonstrates global hypokinesis. The left ventricular internal cavity size was normal in size. There is mild left ventricular hypertrophy. Left ventricular diastolic parameters are indeterminate. Right Ventricle: The right ventricular size is normal. No increase in right ventricular wall thickness. Right ventricular systolic  function is normal. Left Atrium: Left atrial size was severely dilated. Right Atrium: Right atrial size was mild to moderately dilated. Pericardium: There is no evidence of pericardial effusion. Mitral Valve: The mitral valve is normal in structure. There is mild thickening of the mitral valve leaflet(s). There is mild calcification of the mitral valve leaflet(s). Mild mitral annular calcification. Mild mitral valve regurgitation. No evidence of  mitral valve stenosis. Tricuspid Valve: The tricuspid valve is not well visualized. Tricuspid valve regurgitation is mild . No evidence of tricuspid stenosis. Aortic Valve: The aortic valve is tricuspid. There is mild calcification of the aortic valve. There is mild thickening of the aortic valve. There is mild aortic valve annular calcification. Aortic valve regurgitation is mild. Aortic regurgitation PHT measures 574 msec. Aortic valve mean gradient measures 4.5 mmHg. Aortic valve peak gradient measures 7.9 mmHg. Aortic valve area, by VTI measures 3.68 cm. Pulmonic Valve: The pulmonic valve was not well visualized. Pulmonic valve regurgitation is not visualized. No evidence of pulmonic stenosis. Aorta: The aortic root is normal in size and structure. Pulmonary Artery: Indeterminant PASP, inadequate TR jet. Venous: The inferior vena cava is normal in size with greater than 50% respiratory variability, suggesting right atrial pressure of 3 mmHg. IAS/Shunts: The interatrial septum was not well visualized. Additional  Comments: A device lead is visualized.  LEFT VENTRICLE PLAX 2D LVIDd:         5.81 cm      Diastology LVIDs:         4.93 cm      LV e' medial:    4.03 cm/s LV PW:         1.20 cm      LV E/e' medial:  20.5 LV IVS:        1.24 cm      LV e' lateral:   4.03 cm/s LVOT diam:     2.70 cm      LV E/e' lateral: 20.5 LV SV:         97 LV SV Index:   51 LVOT Area:     5.73 cm  LV Volumes (MOD) LV vol d, MOD A4C: 193.0 ml LV vol s, MOD A4C: 132.0 ml LV SV MOD A4C:     193.0 ml RIGHT VENTRICLE RV Basal diam:  3.74 cm RV S prime:     12.20 cm/s LEFT ATRIUM             Index       RIGHT ATRIUM           Index LA diam:        4.60 cm 2.41 cm/m  RA Area:     21.40 cm LA Vol (A2C):   74.3 ml 38.91 ml/m RA Volume:   68.30 ml  35.76 ml/m LA Vol (A4C):   89.3 ml 46.76 ml/m LA Biplane Vol: 86.8 ml 45.45 ml/m  AORTIC VALVE AV Area (Vmax):    3.97 cm AV Area (Vmean):   3.86 cm AV Area (VTI):     3.68 cm AV Vmax:           140.47 cm/s AV Vmean:          100.930 cm/s AV VTI:            0.263 m AV Peak Grad:      7.9 mmHg AV Mean Grad:      4.5 mmHg LVOT Vmax:         97.30 cm/s LVOT Vmean:  68.100 cm/s LVOT VTI:          0.169 m LVOT/AV VTI ratio: 0.64 AI PHT:            574 msec  AORTA Ao Root diam: 3.10 cm MITRAL VALVE MV Area (PHT): 6.02 cm    SHUNTS MV Decel Time: 126 msec    Systemic VTI:  0.17 m MV E velocity: 82.70 cm/s  Systemic Diam: 2.70 cm MV A velocity: 52.80 cm/s MV E/A ratio:  1.57 Carlyle Dolly MD Electronically signed by Carlyle Dolly MD Signature Date/Time: 07/07/2020/5:13:03 PM    Final      CBC Recent Labs  Lab 07/17/20 1723 07/19/20 0430 07/20/20 0613  WBC 9.3 10.8* 12.3*  HGB 11.9* 12.5* 12.7*  HCT 37.8* 39.9 40.4  PLT 143* 122* 102*  MCV 103.6* 104.7* 104.4*  MCH 32.6 32.8 32.8  MCHC 31.5 31.3 31.4  RDW 15.9* 15.9* 15.9*  LYMPHSABS 0.5* 0.6*  --   MONOABS 0.5 0.0*  --   EOSABS 0.1 0.1  --   BASOSABS 0.0 0.0  --     Chemistries  Recent Labs  Lab 07/17/20 1723 07/19/20 0430  07/20/20 0613  NA 140 136 135  K 3.9 4.9 4.7  CL 95* 93* 94*  CO2 33* 32 31  GLUCOSE 147* 152* 119*  BUN 26* 33* 41*  CREATININE 1.45* 1.78* 2.04*  CALCIUM 8.5* 8.6* 8.4*  MG  --   --  2.0  AST 19  --  18  ALT 17  --  17  ALKPHOS 81  --  88  BILITOT 1.0  --  0.7   ------------------------------------------------------------------------------------------------------------------ No results for input(s): CHOL, HDL, LDLCALC, TRIG, CHOLHDL, LDLDIRECT in the last 72 hours.  Lab Results  Component Value Date   HGBA1C 7.1 (H) 07/10/2020   ------------------------------------------------------------------------------------------------------------------ No results for input(s): TSH, T4TOTAL, T3FREE, THYROIDAB in the last 72 hours.  Invalid input(s): FREET3 ------------------------------------------------------------------------------------------------------------------ No results for input(s): VITAMINB12, FOLATE, FERRITIN, TIBC, IRON, RETICCTPCT in the last 72 hours.  Coagulation profile Recent Labs  Lab 07/17/20 1723 07/19/20 0406 07/20/20 0613  INR 2.3* 2.2* 2.9*    No results for input(s): DDIMER in the last 72 hours.  Cardiac Enzymes No results for input(s): CKMB, TROPONINI, MYOGLOBIN in the last 168 hours.  Invalid input(s): CK ------------------------------------------------------------------------------------------------------------------    Component Value Date/Time   BNP 517.0 (H) 07/11/2020 0615   BNP 144.7 (H) 01/20/2015 1223     Roxan Hockey M.D on 07/20/2020 at 12:49 PM  Go to [email or web address] - for contact info  [facility] - Office  [phone]

## 2020-07-20 NOTE — TOC Progression Note (Signed)
Transition of Care [facility]) - Progression Note    Patient Details  Name: BILLAL ROLLO MRN: [id number] Date of Birth: 04/21/1940  [facility] CM/SW Contact  Ihor Gully, LCSW Phone Number: 07/20/2020, 2:24 PM  Clinical Narrative:    Spoke with patient and niece, Butch Penny, agreeable to extend SNF search to other facilities. Referrals sent to additional facilities.         Expected Discharge Plan and Services                                                 Social Determinants of Health (SDOH) Interventions    Readmission Risk Interventions Readmission Risk Prevention Plan 07/11/2020 07/06/2020  Medication Screening - Complete  Transportation Screening - Complete  PCP or Specialist Appt within 5-7 Days Complete -  Home Care Screening Complete -  Medication Review (RN CM) Complete -  Some recent data might be hidden

## 2020-07-20 NOTE — Telephone Encounter (Signed)
Noted.  Pt was admitted to [facility] 07/17/20

## 2020-07-20 NOTE — Telephone Encounter (Signed)
Per Troy [name]- she's unable to check INR today due to pt being admitted

## 2020-07-20 NOTE — Progress Notes (Signed)
Received referral to complete or create Advanced Directive. Found Mr. Hawe sitting in his recliner in room awake and alert. He welcomed Chaplain chairside warmly. Chaplain reviewed HPOA/Living Will directives and learned that although he might be interested in completing these documents at a later date, he is most interested in getting a Immunologist. He explained that he uses cash only from a Savings account to pay his bills in [employer] form. His niece Butch Penny would have to gain a Durable Power of Attorney or wait until he is discharged from the hospital to go to the bank and sign paperwork to add her to his account. Chaplain explained this to Mr. Berry and to Butch Penny via telephone. Chaplain provided prayer also and made a visit in order to provide spiritual support. He thanked Clinical biochemist for the assistance. Will continue to remain available in order to provide support and to assess for spiritual need.

## 2020-07-20 NOTE — Progress Notes (Addendum)
Physical Therapy Treatment Patient Details Name: Troy [name] MRN: [id number] DOB: 08/13/1940 Today's Date: 07/20/2020    History of Present Illness Troy [name] is an 80 yo male who presents with c/o being weak, having trouble walking at home. PMH: afib, CAD, COPD< CHF, diabetes, HTN, pacemaker placement    PT Comments    Patient presents asleep in bed easily aroused and agreeable to therapy. Patient demonstrates min assist bed mobility with increased time and labored movement. Patient was able to sit at EOB with good/fair seated mobility. Patient is on 4 Lpm of O2 with SpO2 at 100% at rest, with seated exercise and therapeutic activity patient desaturated to low 80%. Patient was able to ambulate at bed side for 8 feet with RW then required transfer to chair due to reports of fatigue and SOB. Patient will benefit from continued physical therapy in hospital and recommended venue below to increase strength, balance, endurance for safe ADLs and gait.   Follow Up Recommendations  SNF     Equipment Recommendations  None recommended by PT    Recommendations for Other Services       Precautions / Restrictions Precautions Precautions: Fall Restrictions Weight Bearing Restrictions: No    Mobility  Bed Mobility Overal bed mobility: Modified Independent             General bed mobility comments: HOB elevated Patient Response: Cooperative  Transfers Overall transfer level: Modified independent Equipment used: Rolling walker (2 wheeled) Transfers: Sit to/from Stand Sit to Stand: Min guard;Min assist Stand pivot transfers: Min assist       General transfer comment: can transfer with RW, reliant on UE for power and quickly becomings SOB with SpO2 dropping from 100 to 89% on 4 Lpm O2  Ambulation/Gait Ambulation/Gait assistance: Min assist;Min guard Gait Distance (Feet): 8 Feet Assistive device: Rolling walker (2 wheeled) Gait Pattern/deviations: Step-through  pattern;Decreased step length - left;Decreased stance time - right;Narrow base of support;Decreased stride length Gait velocity: decreased   General Gait Details: Slow labored movement, increaced time, limited to short steps at bed side, complaints of SOB and SpO2 drops to 85% with exertion   Stairs             Wheelchair Mobility    Modified Rankin (Stroke Patients Only)       Balance Overall balance assessment: Needs assistance Sitting-balance support: Feet supported Sitting balance-[name]: Good Sitting balance - Comments: at EOB   Standing balance support: Bilateral upper extremity supported;During functional activity Standing balance-[name]: Poor Standing balance comment: reliant on UE support using RW                            Cognition Arousal/Alertness: Awake/alert Behavior During Therapy: WFL for tasks assessed/performed Overall Cognitive Status: Within Functional Limits for tasks assessed                                        Exercises General Exercises - Lower Extremity Long Arc Quad: Seated;AROM;Strengthening;Both;10 reps Hip Flexion/Marching: Seated;Standing;AROM;Both;10 reps;Strengthening Toe Raises: Seated;AROM;Both;10 reps;Strengthening Heel Raises: Seated;AROM;Strengthening;Both;10 reps    General Comments        Pertinent Vitals/Pain Pain Assessment: No/denies pain    Home Living                      Prior Function  PT Goals (current goals can now be found in the care plan section) Acute Rehab PT Goals Patient Stated Goal: rehab, then home with an aide PT Goal Formulation: With patient Time For Goal Achievement: 08-09-2020 Potential to Achieve Goals: Good Progress towards PT goals: Progressing toward goals    Frequency    Min 3X/week      PT Plan Current plan remains appropriate    Co-evaluation              AM-PAC PT "6 Clicks" Mobility   Outcome Measure   Help needed turning from your back to your side while in a flat bed without using bedrails?: None Help needed moving from lying on your back to sitting on the side of a flat bed without using bedrails?: None Help needed moving to and from a bed to a chair (including a wheelchair)?: A Little Help needed standing up from a chair using your arms (e.g., wheelchair or bedside chair)?: A Little Help needed to walk in hospital room?: A Lot Help needed climbing 3-5 steps with a railing? : A Lot 6 Click Score: 18    End of Session Equipment Utilized During Treatment: Oxygen Activity Tolerance: Patient tolerated treatment well Patient left: in chair;with call bell/phone within reach Nurse Communication: Mobility status;Other (comment) PT Visit Diagnosis: Unsteadiness on feet (R26.81);Other abnormalities of gait and mobility (R26.89);Muscle weakness (generalized) (M62.81)     Time: 0947-0962 PT Time Calculation (min) (ACUTE ONLY): 20 min  Charges:  $Therapeutic Exercise: 8-22 mins $Therapeutic Activity: 8-22 mins                    11:44 AM, 07/20/20 Sinclair Ship SPT  11:44 AM, 07/20/20 Lonell Grandchild, MPT Physical Therapist with [facility] [phone] office [phone] mobile phone

## 2020-07-20 NOTE — Progress Notes (Signed)
Pharmacy Antibiotic Note  Troy [name] is a 80 y.o. male admitted on 07/17/2020 with  ESBL bacteremia .  Pharmacy has been consulted for meropenem dosing.  Plan: Meropenem 1000 mg IV every 12 hours. Monitor labs ,c/s, and patient improvement.  Height: 5\' 6"  (167.6 cm) Weight: 80.4 kg (177 lb 4 oz) IBW/kg (Calculated) : 63.8  Temp (24hrs), Avg:97.6 F (36.4 C), Min:97.5 F (36.4 C), Max:97.9 F (36.6 C)  Recent Labs  Lab 07/17/20 1723 07/19/20 0430 07/19/20 0641 07/20/20 0613  WBC 9.3 10.8*  --  12.3*  CREATININE 1.45* 1.78*  --  2.04*  LATICACIDVEN  --  3.2* 1.6  --     Estimated Creatinine Clearance: 28.8 mL/min (A) (by C-G formula based on SCr of 2.04 mg/dL (H)).    Allergies  Allergen Reactions   Benadryl [Diphenhydramine Hcl] Nausea And Vomiting    Antimicrobials this admission: Merrem 6/30 >>  Azith 6/29 >>  CTX 6/29 >> 6/30 Fluconazole 6/29 >> 7/1   Microbiology results: 6/29 BCx: gram neg rods BCID: ESBL e. coli 6/29 UCx: >100k gram neg rods  6/29 Sputum: pending    Thank you for allowing pharmacy to be a part of this patient's care.  Ramond Craver 07/20/2020 8:27 AM

## 2020-07-20 NOTE — Progress Notes (Signed)
ANTICOAGULATION CONSULT NOTE - Pharmacy Consult for warfarin Indication: atrial fibrillation  Allergies  Allergen Reactions   Benadryl [Diphenhydramine Hcl] Nausea And Vomiting    Patient Measurements: Height: 5\' 6"  (167.6 cm) Weight: 80.4 kg (177 lb 4 oz) IBW/kg (Calculated) : 63.8  Vital Signs: Temp: 97.5 F (36.4 C) (06/30 0743) Temp Source: Axillary (06/30 0743) BP: 123/62 (06/30 0800) Pulse Rate: 70 (06/30 0800)  Labs: Recent Labs    07/17/20 1723 07/19/20 0406 07/19/20 0430 07/20/20 0613  HGB 11.9*  --  12.5* 12.7*  HCT 37.8*  --  39.9 40.4  PLT 143*  --  122* 102*  LABPROT 25.3* 24.6*  --  30.2*  INR 2.3* 2.2*  --  2.9*  CREATININE 1.45*  --  1.78* 2.04*     Estimated Creatinine Clearance: 28.8 mL/min (A) (by C-G formula based on SCr of 2.04 mg/dL (H)).   Medical History: Past Medical History:  Diagnosis Date   Atrial fibrillation ([place])    CAD (coronary artery disease) 1996   status post PCI of the RCA    Community acquired pneumonia 08/24/2015   Congestive heart failure, unspecified    COPD (chronic obstructive pulmonary disease) (HCC)    Pt on home O2 at night and PRN, unsure of date of diagnosis.   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HCC)    Nephrolithiasis    Other primary cardiomyopathies    Personal history of DVT (deep vein thrombosis)    Prostatitis    Shingles     Medications:  See med rec   Assessment: Pharmacy consulted to dose warfarin in patient with atrial fibrillation.  INR on admission is therapeutic at 2.3.  Home dose listed 2 mg daily. Patient on diflucan also with drug interaction and may result in increased INR.  INR = 2.2 >> 2.9  Patient started on Fluconazole which could increase INR  Goal of Therapy:  INR 2-3 Monitor platelets by anticoagulation protocol: Yes   Plan:  Hold  warfarin x 1 dose due to large increase in INR/patient just started on fluconazole Monitor daily INR and s/s of bleeding.  Margot Ables, PharmD Clinical Pharmacist 07/20/2020 8:33 AM

## 2020-07-21 LAB — BASIC METABOLIC PANEL
Anion gap: 7 (ref 5–15)
BUN: 42 mg/dL — ABNORMAL HIGH (ref 8–23)
CO2: 31 mmol/L (ref 22–32)
Calcium: 8.4 mg/dL — ABNORMAL LOW (ref 8.9–10.3)
Chloride: 97 mmol/L — ABNORMAL LOW (ref 98–111)
Creatinine, Ser: 1.85 mg/dL — ABNORMAL HIGH (ref 0.61–1.24)
GFR, Estimated: 36 mL/min — ABNORMAL LOW (ref >60)
Glucose, Bld: 108 mg/dL — ABNORMAL HIGH (ref 70–99)
Potassium: 4.5 mmol/L (ref 3.5–5.1)
Sodium: 135 mmol/L (ref 135–145)

## 2020-07-21 LAB — GLUCOSE, CAPILLARY
Glucose-Capillary: 90 mg/dL (ref 70–99)
Glucose-Capillary: 177 mg/dL — ABNORMAL HIGH (ref 70–99)
Glucose-Capillary: 141 mg/dL — ABNORMAL HIGH (ref 70–99)
Glucose-Capillary: 200 mg/dL — ABNORMAL HIGH (ref 70–99)

## 2020-07-21 LAB — URINE CULTURE: Culture: >=100000 — AB

## 2020-07-21 LAB — CULTURE, BLOOD (ROUTINE X 2)
Special Requests: ADEQUATE
Special Requests: ADEQUATE

## 2020-07-21 LAB — CBC
HCT: 37.4 % — ABNORMAL LOW (ref 39.0–52.0)
Hemoglobin: 11.8 g/dL — ABNORMAL LOW (ref 13.0–17.0)
MCH: 32.5 pg (ref 26.0–34.0)
MCHC: 31.6 g/dL (ref 30.0–36.0)
MCV: 103 fL — ABNORMAL HIGH (ref 80.0–100.0)
Platelets: 102 10*3/uL — ABNORMAL LOW (ref 150–400)
RBC: 3.63 MIL/uL — ABNORMAL LOW (ref 4.22–5.81)
RDW: 15.6 % — ABNORMAL HIGH (ref 11.5–15.5)
WBC: 10.8 10*3/uL — ABNORMAL HIGH (ref 4.0–10.5)
nRBC: 0 % (ref 0.0–0.2)

## 2020-07-21 LAB — PROTIME-INR
INR: 3 — ABNORMAL HIGH (ref 0.8–1.2)
Prothrombin Time: 30.7 seconds — ABNORMAL HIGH (ref 11.4–15.2)

## 2020-07-21 MED ORDER — WARFARIN SODIUM 1 MG PO TABS
1.0000 mg | ORAL_TABLET | Freq: Once | ORAL | Status: AC
  Administered 2020-07-21: 1 mg via ORAL
  Filled 2020-07-21: qty 1

## 2020-07-21 MED ORDER — MELATONIN 3 MG PO TABS
3.0000 mg | ORAL_TABLET | Freq: Once | ORAL | Status: AC
  Administered 2020-07-21: 3 mg via ORAL
  Filled 2020-07-21: qty 1

## 2020-07-21 MED ORDER — SODIUM CHLORIDE 0.9 % IV SOLN
1.0000 g | INTRAVENOUS | Status: DC
  Administered 2020-07-22: 1000 mg via INTRAVENOUS
  Filled 2020-07-21 (×4): qty 1

## 2020-07-21 NOTE — Progress Notes (Addendum)
Physical Therapy Treatment Patient Details Name: Troy [name] MRN: [id number] DOB: 1940/11/29 Today's Date: 07/21/2020    History of Present Illness Troy [name] is an 80 yo male who presents with c/o being weak, having trouble walking at home. PMH: afib, CAD, COPD< CHF, diabetes, HTN, pacemaker placement    PT Comments    Patient presents in bed and agreeable to therapy demonstrating good bed mobility and transfer to sitting at EOB. Patient required resting break after transfers due to increase reports of SOB.  Patient complete pre-gait activity to increase strength in LE, requiring frequent rest breaks due to SOB and fatigue. Patient ambulated for 25 feet around the room with RW, including ambulating forward and backwards without loss of balance.Patient was on 3 Lpm of O2 with SpO2 at 100% at rest with only minor desat to 95% during exertion during therapy.  Patient will benefit from continued physical therapy in hospital and recommended venue below to increase strength, balance, endurance for safe ADLs and gait.   Follow Up Recommendations  SNF     Equipment Recommendations  None recommended by PT    Recommendations for Other Services       Precautions / Restrictions Precautions Precautions: Fall Restrictions Weight Bearing Restrictions: No    Mobility  Bed Mobility Overal bed mobility: Modified Independent             General bed mobility comments: HOB elevated, increase time and labored movement, required patient to rest at EOB Patient Response: Cooperative  Transfers Overall transfer level: Modified independent Equipment used: Rolling walker (2 wheeled) Transfers: Sit to/from Stand Sit to Stand: Min guard;Min assist Stand pivot transfers: Min assist;Min guard       General transfer comment: can transfer with RW, reliant on UE for power and quickly becomings SOB quickly requireing standing break  Ambulation/Gait Ambulation/Gait assistance: Min  assist;Min guard Gait Distance (Feet): 25 Feet Assistive device: Rolling walker (2 wheeled) Gait Pattern/deviations: Step-through pattern;Decreased step length - left;Decreased stance time - right;Narrow base of support;Decreased stride length;Trunk flexed Gait velocity: decreased   General Gait Details: Slow labored movement, increased time, patient ambulated around room and forward and backwards with RW, complaints of SOB requireing frequent standing breaks   Stairs             Wheelchair Mobility    Modified Rankin (Stroke Patients Only)       Balance Overall balance assessment: Needs assistance Sitting-balance support: Feet supported Sitting balance-[name]: Fair Sitting balance - Comments: good/fair at EOB   Standing balance support: Bilateral upper extremity supported;During functional activity Standing balance-[name]: Poor Standing balance comment: reliant on UE support using RW                            Cognition Arousal/Alertness: Awake/alert Behavior During Therapy: WFL for tasks assessed/performed Overall Cognitive Status: Within Functional Limits for tasks assessed                                        Exercises General Exercises - Lower Extremity Long Arc Quad: Seated;AROM;Strengthening;Both;10 reps Hip Flexion/Marching: Seated;AROM;Both;10 reps;Strengthening Toe Raises: Seated;AROM;Both;10 reps;Strengthening Heel Raises: Seated;AROM;Strengthening;Both;10 reps Mini-Sqauts: Standing;AROM;Strengthening;10 reps;Both    General Comments        Pertinent Vitals/Pain Pain Assessment: No/denies pain    Home Living  Prior Function            PT Goals (current goals can now be found in the care plan section) Acute Rehab PT Goals Patient Stated Goal: rehab, then home with an aide PT Goal Formulation: With patient Time For Goal Achievement: 08-19-20 Potential to Achieve Goals:  Good Progress towards PT goals: Progressing toward goals    Frequency    Min 3X/week      PT Plan Current plan remains appropriate    Co-evaluation PT/OT/SLP Co-Evaluation/Treatment: Yes Reason for Co-Treatment: To address functional/ADL transfers PT goals addressed during session: Mobility/safety with mobility        AM-PAC PT "6 Clicks" Mobility   Outcome Measure    Help needed moving from lying on your back to sitting on the side of a flat bed without using bedrails?: None Help needed moving to and from a bed to a chair (including a wheelchair)?: A Little Help needed standing up from a chair using your arms (e.g., wheelchair or bedside chair)?: A Little Help needed to walk in hospital room?: A Lot Help needed climbing 3-5 steps with a railing? : A Lot 6 Click Score: 14    End of Session Equipment Utilized During Treatment: Oxygen Activity Tolerance: Patient tolerated treatment well;Patient limited by fatigue Patient left: in chair;with call bell/phone within reach Nurse Communication: Mobility status PT Visit Diagnosis: Unsteadiness on feet (R26.81);Other abnormalities of gait and mobility (R26.89);Muscle weakness (generalized) (M62.81)     Time: 4481-8563 PT Time Calculation (min) (ACUTE ONLY): 22 min  Charges:  $Gait Training: 8-22 mins $Therapeutic Exercise: 8-22 mins                    10:23 AM, 07/21/20 Sinclair Ship SPT  10:23 AM, 07/21/20 Lonell Grandchild, MPT Physical Therapist with [facility] [phone] office [phone] mobile phone

## 2020-07-21 NOTE — Progress Notes (Signed)
Patient Demographics:    Troy [name], is a 80 y.o. male, DOB - [birth year], OIL:[id number]  Admit date - 07/17/2020   Admitting Physician Troy Plate, DO  Outpatient Primary MD for the patient is Patient, No Pcp Per (Inactive)  LOS - 2   Chief Complaint  Patient presents with   Weakness        Subjective:    Troy [name] today has no fevers, no emesis,  No chest pain,    -Pt c/o generalized weakness  -Physical therapy and outpatient therapy at bedside  Assessment  & Plan :    Principal Problem:   Bacteremia and Sepsis due to ESBL-producing Escherichia coli Active Problems:   UTI due to extended-spectrum beta lactamase (ESBL) producing Escherichia coli   Chronic atrial fibrillation (HCC)   Controlled type 2 diabetes mellitus without complication, without long-term current use of insulin (HCC)   Chronic respiratory failure with hypoxia ([place])   COVID-19   AKI (acute kidney injury) ([place])   Pressure injury of skin   Acute lower UTI  Brief Summary:- 80 y.o. male, with history of atrial fibrillation, coronary artery disease, congestive heart failure, COPD, diabetes mellitus type 2, GERD, hypertension, ischemic dilated cardiomyopathy, history of DVT as well as recent COVID-19 infection diagnosed on 07/04/2020, recently discharged from this facility on 07/11/2020 not readmitted on 07/19/2020 with  ESBL E coli Sepsis from Urinary Source  A/p 1)ESBL E coli Sepsis from Urinary Source----patient met sepsis criteria on admission with a temperature of 102.4, respiratory of 31,, WBC of 10.8 -Lactic acid was 3.2 and patient had transient hypotension PCT 9.86 -Blood cultures and urine cultures from 07/19/2020 with ESBL E. Coli -Stop Rocephin to 2 g daily Continue IV meropenem --started on 07/20/2020 -Possible discharge to SNF on 07/22/2020 on IV Invanz -UA also with yeast --completed  Diflucan -Hemodynamics have improved avoid over aggressive hydration given low EF -GI panel/stool culture negative  2)AKI----acute kidney injury -on CKD 3A -Worsening renal function secondary to UTI, transient hypotension and dehydration -Creatinine is back down to to 1.85 from 2.04 (was 1.45 on admission)  from a baseline of 1.0 recently suspended due to sepsis , renally adjust medications, avoid nephrotoxic agents / dehydration  / hypotension  3)HFrEF--patient with chronic systolic dysfunction CHF with EF in the 20 to 25% range -Be judicious with sepsis related IV fluids at this time -Coreg has been reduced to 3.125 mg twice daily from 12.5 mg twice daily due to soft BP -Reintroduce cardiac meds as BP improves -Hold torsemide obviously for now given sepsis with soft BP and AKI  4)DM2-last A1c 7.1 reflecting uncontrolled diabetes PTA, Use Novolog/Humalog Sliding scale insulin with Accu-Cheks/Fingersticks as ordered   5)PAFib/pacemaker in situ----- continue Coumadin for stroke prophylaxis, on digoxin and Coreg for rate control -A rate-control strategy has been pursued as he is not an ideal candidate for anti-arrhythmic therapy given his COPD and renal issues. -INR may be erratic in the setting of Diflucan use with the cytochrome P4 50 interaction with Coumadin  6) chronic hypoxic respiratory failure due to underlying COPD--at baseline patient uses 3 L of oxygen via nasal cannula currently requiring 4 L -Continue bronchodilators -Hold off on steroids, no acute COPD exacerbation at this time  7)Recent COVID-19  infection--- tested positive on 07/04/2020, largely asymptomatic from [place] standpoint okay to discontinue isolation  8)Social/ethics--- full code  9)Generalized weakness and Ambulatory Dysfunction--- PT eval appreciated recommends SNF rehab  Disposition/Need for in-Hospital Stay- patient unable to be discharged at this time due to ESBL E coli sepsis requiring IV antibiotics and  AKI requiring IV fluids*  Status is: Inpatient  Remains inpatient appropriate because: Please see disposition above  Disposition: The patient is from: Home              Anticipated d/c is to: SNF--- anticipate discharge on IV Invanz on 07/22/2020              Anticipated d/c date is: 1 day              Patient currently is not medically stable to d/c. Barriers: Not Clinically Stable-   Code Status :  -  Code Status: Full Code   Family Communication:    NA (patient is alert, awake and coherent) Na  Consults  :  NA  DVT Prophylaxis  :   - SCDs  SCDs Start: 07/19/20 8159    Lab Results  Component Value Date   PLT 102 (L) 07/21/2020    Inpatient Medications  Scheduled Meds:  alfuzosin  10 mg Oral Daily   vitamin C  1,000 mg Oral q morning   atorvastatin  10 mg Oral Daily   carvedilol  3.125 mg Oral BID WC   Chlorhexidine Gluconate Cloth  6 each Topical Daily   cholecalciferol  1,000 Units Oral Daily   digoxin  62.5 mcg Oral Daily   fluticasone furoate-vilanterol  1 puff Inhalation Daily   umeclidinium bromide  1 puff Inhalation Daily   Warfarin - Pharmacist Dosing Inpatient   Does not apply q1600   zinc sulfate  220 mg Oral Daily   Continuous Infusions:  sodium chloride 50 mL/hr at 07/21/20 1056   [START ON 07/22/2020] ertapenem     meropenem (MERREM) IV 1 g (07/21/20 1059)   PRN Meds:.acetaminophen **OR** acetaminophen, albuterol, benzonatate, fluticasone, guaiFENesin-dextromethorphan, ondansetron **OR** ondansetron (ZOFRAN) IV, oxyCODONE, polyethylene glycol    Anti-infectives (From admission, onward)    Start     Dose/Rate Route Frequency Ordered Stop   07/22/20 0900  ertapenem (INVANZ) 1,000 mg in sodium chloride 0.9 % 100 mL IVPB        1 g 200 mL/hr over 30 Minutes Intravenous Every 24 hours 07/21/20 1253     07/20/20 1100  cefTRIAXone (ROCEPHIN) 2 g in sodium chloride 0.9 % 100 mL IVPB  Status:  Discontinued        2 g 200 mL/hr over 30 Minutes Intravenous  Every 24 hours 07/19/20 1756 07/20/20 0802   07/20/20 0900  meropenem (MERREM) 1 g in sodium chloride 0.9 % 100 mL IVPB        1 g 200 mL/hr over 30 Minutes Intravenous Every 12 hours 07/20/20 0805 07/21/20 2359   07/19/20 1900  cefTRIAXone (ROCEPHIN) 1 g in sodium chloride 0.9 % 100 mL IVPB        1 g 200 mL/hr over 30 Minutes Intravenous  Once 07/19/20 1802 07/20/20 0405   07/19/20 0830  fluconazole (DIFLUCAN) IVPB 200 mg        200 mg 100 mL/hr over 60 Minutes Intravenous Every 24 hours 07/19/20 0825 07/21/20 1032   07/19/20 0545  cefTRIAXone (ROCEPHIN) 1 g in sodium chloride 0.9 % 100 mL IVPB  Status:  Discontinued  1 g 200 mL/hr over 30 Minutes Intravenous Every 24 hours 07/19/20 0544 07/19/20 1756   07/19/20 0545  azithromycin (ZITHROMAX) 500 mg in sodium chloride 0.9 % 250 mL IVPB        500 mg 250 mL/hr over 60 Minutes Intravenous Every 24 hours 07/19/20 0544 07/20/20 0630         Objective:   Vitals:   07/21/20 1400 07/21/20 1500 07/21/20 1600 07/21/20 1625  BP:  (!) 141/48 138/60   Pulse: (!) 137 71 69   Resp: (!) 22 (!) 27 (!) 21   Temp:    (!) 97.5 F (36.4 C)  TempSrc:    Axillary  SpO2: 100% 100% 100%   Weight:      Height:        Wt Readings from Last 3 Encounters:  07/21/20 82.9 kg  07/11/20 75.7 kg  03/07/20 84.7 kg    Intake/Output Summary (Last 24 hours) at 07/21/2020 1749 Last data filed at 07/21/2020 1747 Gross per 24 hour  Intake 1436.33 ml  Output 600 ml  Net 836.33 ml   Physical Exam  Gen:- Awake Alert,  in no apparent distress, speaking in complete sentences HEENT:- [place].AT, No sclera icterus Neck-Supple Neck,No JVD,.  Lungs-improved air movement, no wheezing  CV- S1, S2 normal, regular  Abd-  +ve B.Sounds, Abd Soft, No tenderness,    Extremity/Skin:- No  edema, pedal pulses present  Psych-affect is appropriate, oriented x3 Neuro-generalized weakness, no new focal deficits, no tremors   Data Review:   Micro Results Recent Results  (from the past 240 hour(s))  Urine culture     Status: Abnormal   Collection Time: 07/19/20  4:19 AM   Specimen: Urine, Clean Catch  Result Value Ref Range Status   Specimen Description   Final    URINE, CLEAN CATCH Performed at [facility], [address], [place], [place] [address]    Special Requests   Final    NONE Performed at [facility], [address] [place], [place] [address]    Culture (A)  Final    >=100,000 COLONIES/mL ESCHERICHIA COLI Confirmed Extended Spectrum Beta-Lactamase Producer (ESBL).  In bloodstream infections from ESBL organisms, carbapenems are preferred over piperacillin/tazobactam. They are shown to have a lower risk of mortality.    Report Status 07/21/2020 FINAL  Final   Organism ID, Bacteria ESCHERICHIA COLI (A)  Final      Susceptibility   Escherichia coli - MIC*    AMPICILLIN >=32 RESISTANT Resistant     CEFAZOLIN >=64 RESISTANT Resistant     CEFEPIME 16 RESISTANT Resistant     CEFTRIAXONE >=64 RESISTANT Resistant     CIPROFLOXACIN 1 SENSITIVE Sensitive     GENTAMICIN >=16 RESISTANT Resistant     IMIPENEM <=0.25 SENSITIVE Sensitive     NITROFURANTOIN <=16 SENSITIVE Sensitive     TRIMETH/SULFA >=320 RESISTANT Resistant     AMPICILLIN/SULBACTAM >=32 RESISTANT Resistant     PIP/TAZO <=4 SENSITIVE Sensitive     * >=100,000 COLONIES/mL ESCHERICHIA COLI  Gastrointestinal Panel by PCR , Stool     Status: None   Collection Time: 07/19/20  4:20 AM   Specimen: Stool  Result Value Ref Range Status   Campylobacter species NOT DETECTED NOT DETECTED Final   Plesimonas shigelloides NOT DETECTED NOT DETECTED Final   Salmonella species NOT DETECTED NOT DETECTED Final   Yersinia enterocolitica NOT DETECTED NOT DETECTED Final   Vibrio species NOT DETECTED NOT DETECTED Final   Vibrio cholerae NOT DETECTED  NOT DETECTED Final   Enteroaggregative E coli (EAEC) NOT DETECTED NOT DETECTED Final   Enteropathogenic E coli (EPEC) NOT DETECTED NOT DETECTED Final    Enterotoxigenic E coli (ETEC) NOT DETECTED NOT DETECTED Final   Shiga like toxin producing E coli (STEC) NOT DETECTED NOT DETECTED Final   Shigella/Enteroinvasive E coli (EIEC) NOT DETECTED NOT DETECTED Final   Cryptosporidium NOT DETECTED NOT DETECTED Final   Cyclospora cayetanensis NOT DETECTED NOT DETECTED Final   Entamoeba histolytica NOT DETECTED NOT DETECTED Final   Giardia lamblia NOT DETECTED NOT DETECTED Final   Adenovirus F40/41 NOT DETECTED NOT DETECTED Final   Astrovirus NOT DETECTED NOT DETECTED Final   Norovirus GI/GII NOT DETECTED NOT DETECTED Final   Rotavirus A NOT DETECTED NOT DETECTED Final   Sapovirus (I, II, IV, and V) NOT DETECTED NOT DETECTED Final    Comment: Performed at [facility], [address] [place], [place] [address]  Culture, blood (routine x 2)     Status: Abnormal   Collection Time: 07/19/20  4:32 AM   Specimen: BLOOD RIGHT HAND  Result Value Ref Range Status   Specimen Description   Final    BLOOD RIGHT HAND Performed at [facility], [address], [place], [place] [address]    Special Requests   Final    BOTTLES DRAWN AEROBIC AND ANAEROBIC Blood Culture adequate volume Performed at [facility], [address] [place], [place] [address]    Culture  Setup Time   Final    GRAM NEGATIVE RODS ANAEROBIC BOTTLE ONLY Gram Stain Report Called to,Read Back By and Verified With: Lillie Fragmin, RN _0  07/19/2020 KAY GRAM NEGATIVE RODS AEROBIC BOTTLE ONLY Gram Stain Report Called to,Read Back By and Verified With: PREVIOUSLY CALLED TO BRITTANY FOLEY _1  07/19/2020 KAY Performed at [facility], [address] [place], [place] [address]    Culture (A)  Final    ESCHERICHIA COLI SUSCEPTIBILITIES PERFORMED ON PREVIOUS CULTURE WITHIN THE LAST 5 DAYS. Performed at [facility], [place] [address], [place], [place] [address]    Report Status 07/21/2020 FINAL  Final  Culture, blood (routine x 2)     Status: Abnormal   Collection Time:  07/19/20  4:39 AM   Specimen: BLOOD  Result Value Ref Range Status   Specimen Description   Final    BLOOD LEFT ANTECUBITAL Performed at [facility], [address], [place], [place] [address]    Special Requests   Final    BOTTLES DRAWN AEROBIC AND ANAEROBIC Blood Culture adequate volume Performed at [facility], [address], [place], [place] [address]    Culture  Setup Time   Final    GRAM NEGATIVE RODS IN BOTH AEROBIC AND ANAEROBIC BOTTLES Gram Stain Report Called to,Read Back By and Verified With: BRITTANY FOLEY,RN _2  07/19/2020 KAY Gram Stain Report Called to,Read Back By and Verified With: PREVIOUSLY CALLED _3  07/19/2020 KAY CRITICAL RESULT CALLED TO, READ BACK BY AND VERIFIED WITH: Illene Silver RN 07/20/20 0134 JDW Performed at [facility], [place] [address], [place],  [address]    Culture (A)  Final    ESCHERICHIA COLI Confirmed Extended Spectrum Beta-Lactamase Producer (ESBL).  In bloodstream infections from ESBL organisms, carbapenems are preferred over piperacillin/tazobactam. They are shown to have a lower risk of mortality.    Report Status 07/21/2020 FINAL  Final   Organism ID, Bacteria ESCHERICHIA COLI  Final      Susceptibility   Escherichia coli - MIC*  AMPICILLIN >=32 RESISTANT Resistant     CEFAZOLIN >=64 RESISTANT Resistant     CEFEPIME 4 INTERMEDIATE Intermediate     CEFTAZIDIME RESISTANT Resistant     CEFTRIAXONE >=64 RESISTANT Resistant     CIPROFLOXACIN 0.5 SENSITIVE Sensitive     GENTAMICIN >=16 RESISTANT Resistant     IMIPENEM <=0.25 SENSITIVE Sensitive     TRIMETH/SULFA >=320 RESISTANT Resistant     AMPICILLIN/SULBACTAM >=32 RESISTANT Resistant     PIP/TAZO <=4 SENSITIVE Sensitive     * ESCHERICHIA COLI  Blood Culture ID Panel (Reflexed)     Status: Abnormal   Collection Time: 07/19/20  4:39 AM  Result Value Ref Range Status   Enterococcus faecalis NOT DETECTED NOT DETECTED Final   Enterococcus Faecium NOT DETECTED NOT DETECTED  Final   Listeria monocytogenes NOT DETECTED NOT DETECTED Final   Staphylococcus species NOT DETECTED NOT DETECTED Final   Staphylococcus aureus (BCID) NOT DETECTED NOT DETECTED Final   Staphylococcus epidermidis NOT DETECTED NOT DETECTED Final   Staphylococcus lugdunensis NOT DETECTED NOT DETECTED Final   Streptococcus species NOT DETECTED NOT DETECTED Final   Streptococcus agalactiae NOT DETECTED NOT DETECTED Final   Streptococcus pneumoniae NOT DETECTED NOT DETECTED Final   Streptococcus pyogenes NOT DETECTED NOT DETECTED Final   A.calcoaceticus-baumannii NOT DETECTED NOT DETECTED Final   Bacteroides fragilis NOT DETECTED NOT DETECTED Final   Enterobacterales DETECTED (A) NOT DETECTED Final    Comment: Enterobacterales represent a large order of gram negative bacteria, not a single organism. CRITICAL RESULT CALLED TO, READ BACK BY AND VERIFIED WITH: C KINDLEY RN 07/20/20 0134 JDW    Enterobacter cloacae complex NOT DETECTED NOT DETECTED Final   Escherichia coli DETECTED (A) NOT DETECTED Final    Comment: CRITICAL RESULT CALLED TO, READ BACK BY AND VERIFIED WITH: C KINDLEY RN 07/20/20 0134 JDW    Klebsiella aerogenes NOT DETECTED NOT DETECTED Final   Klebsiella oxytoca NOT DETECTED NOT DETECTED Final   Klebsiella pneumoniae NOT DETECTED NOT DETECTED Final   Proteus species NOT DETECTED NOT DETECTED Final   Salmonella species NOT DETECTED NOT DETECTED Final   Serratia marcescens NOT DETECTED NOT DETECTED Final   Haemophilus influenzae NOT DETECTED NOT DETECTED Final   Neisseria meningitidis NOT DETECTED NOT DETECTED Final   Pseudomonas aeruginosa NOT DETECTED NOT DETECTED Final   Stenotrophomonas maltophilia NOT DETECTED NOT DETECTED Final   Candida albicans NOT DETECTED NOT DETECTED Final   Candida auris NOT DETECTED NOT DETECTED Final   Candida glabrata NOT DETECTED NOT DETECTED Final   Candida krusei NOT DETECTED NOT DETECTED Final   Candida parapsilosis NOT DETECTED NOT DETECTED  Final   Candida tropicalis NOT DETECTED NOT DETECTED Final   Cryptococcus neoformans/gattii NOT DETECTED NOT DETECTED Final   CTX-M ESBL DETECTED (A) NOT DETECTED Final    Comment: CRITICAL RESULT CALLED TO, READ BACK BY AND VERIFIED WITH: C KINDLEY RN 07/20/20 0134 JDW (NOTE) Extended spectrum beta-lactamase detected. Recommend a carbapenem as initial therapy.      Carbapenem resistance IMP NOT DETECTED NOT DETECTED Final   Carbapenem resistance KPC NOT DETECTED NOT DETECTED Final   Carbapenem resistance NDM NOT DETECTED NOT DETECTED Final   Carbapenem resist OXA 48 LIKE NOT DETECTED NOT DETECTED Final   Carbapenem resistance VIM NOT DETECTED NOT DETECTED Final    Comment: Performed at [facility], [place] [address], [place], [place] [address]  MRSA Next Gen by PCR, Nasal     Status: None   Collection Time: 07/19/20  12:00 PM   Specimen: Nasal Mucosa; Nasal Swab  Result Value Ref Range Status   MRSA by PCR Next Gen NOT DETECTED NOT DETECTED Final    Comment: (NOTE) The GeneXpert MRSA Assay (FDA approved for NASAL specimens only), is one component of a comprehensive MRSA colonization surveillance program. It is not intended to diagnose MRSA infection nor to guide or monitor treatment for MRSA infections. Test performance is not FDA approved in patients less than 78 years old. Performed at [facility], [address], [place],  [address]     Radiology Reports DG Chest [place] 1 View  Result Date: 07/19/2020 CLINICAL DATA:  Fever, COPD EXAM: PORTABLE CHEST 1 VIEW COMPARISON:  07/17/2020 FINDINGS: The lungs are symmetrically well expanded. Mild bibasilar atelectasis or infiltrate. No pneumothorax or pleural effusion. Mild cardiomegaly is stable. Left subclavian pacemaker defibrillator is unchanged. The pulmonary vascularity is normal. IMPRESSION: Mild bibasilar atelectasis or infiltrate. Stable cardiomegaly. Electronically Signed   By: Fidela Salisbury MD   On: 07/19/2020 04:59    DG Chest Port 1 View  Result Date: 07/17/2020 CLINICAL DATA:  Weakness.  Chills. EXAM: PORTABLE CHEST 1 VIEW COMPARISON:  07/07/2020 FINDINGS: Cardiomegaly. Pacemaker defibrillator appears the same as seen previously. Aortic atherosclerotic calcification. Small pleural effusion on the right as seen previously. Mild venous hypertension without frank edema. IMPRESSION: Stable exam. Cardiomegaly. Small right effusion. Mild venous hypertension. Electronically Signed   By: Nelson Chimes M.D.   On: 07/17/2020 18:48   DG Chest Port 1 View  Result Date: 07/07/2020 CLINICAL DATA:  Shortness of breath EXAM: PORTABLE CHEST 1 VIEW COMPARISON:  07/05/2020 FINDINGS: Persistent right greater than left pleural effusions with bibasilar atelectasis. Possible mild interstitial edema superimposed on chronic interstitial changes. Stable cardiomegaly. Left chest wall ICD. IMPRESSION: No substantial change. Persistent right greater than left pleural effusions with bibasilar atelectasis. Possible mild interstitial edema superimposed on chronic interstitial changes. Electronically Signed   By: Macy Mis M.D.   On: 07/07/2020 16:25   DG Chest Port 1 View  Result Date: 07/05/2020 CLINICAL DATA:  Shortness of breath and leg swelling EXAM: PORTABLE CHEST 1 VIEW COMPARISON:  06/20/2020 FINDINGS: Cardiac shadow is enlarged but stable. Defibrillator is again noted. Aortic calcifications are seen. Vascular congestion has improved somewhat in the interval from the prior exam. Increasing right-sided pleural effusion is noted with underlying basilar infiltrate. IMPRESSION: Increasing right-sided effusion with underlying right basilar infiltrate. Previously seen vascular congestion has improved in the interval from the prior exam. Electronically Signed   By: Inez Catalina M.D.   On: 07/05/2020 11:23   ECHOCARDIOGRAM COMPLETE  Result Date: 07/07/2020    ECHOCARDIOGRAM REPORT   Patient Name:   LERAY GARVERICK Date of Exam:  07/07/2020 Medical Rec #:  [id number]         Height:       66.0 in Accession #:    [id number]        Weight:       179.5 lb Date of Birth:  17-Jul-1940        BSA:          1.910 m Patient Age:    50 years          BP:           141/76 mmHg Patient Gender: M                 HR:           70 bpm. Exam Location:  [facility] Procedure: 2D Echo, Cardiac Doppler and Color Doppler Indications:    CHF  History:        Patient has prior history of Echocardiogram examinations, most                 recent 02/01/2017. CHF and Cardiomyopathy, CAD, Defibrillator,                 COPD, Arrythmias:Atrial Fibrillation, Signs/Symptoms:Shortness                 of Breath; Risk Factors:Dyslipidemia and LE edema. COVID+.  Sonographer:    Dustin Flock RDCS Referring Phys: [phone] DAVID TAT  Sonographer Comments: Image acquisition challenging due to COPD and Image acquisition challenging due to respiratory motion. IMPRESSIONS  1. Left ventricular ejection fraction, by estimation, is 20 to 25%. The left ventricle has severely decreased function. The left ventricle demonstrates global hypokinesis. There is mild left ventricular hypertrophy. Left ventricular diastolic parameters  are indeterminate.  2. Right ventricular systolic function is normal. The right ventricular size is normal.  3. Left atrial size was severely dilated.  4. Right atrial size was mild to moderately dilated.  5. The mitral valve is normal in structure. Mild mitral valve regurgitation. No evidence of mitral stenosis.  6. The aortic valve is tricuspid. There is mild calcification of the aortic valve. There is mild thickening of the aortic valve. Aortic valve regurgitation is mild.  7. The inferior vena cava is normal in size with greater than 50% respiratory variability, suggesting right atrial pressure of 3 mmHg. FINDINGS  Left Ventricle: Left ventricular ejection fraction, by estimation, is 20 to 25%. The left ventricle has severely decreased function. The left  ventricle demonstrates global hypokinesis. The left ventricular internal cavity size was normal in size. There is mild left ventricular hypertrophy. Left ventricular diastolic parameters are indeterminate. Right Ventricle: The right ventricular size is normal. No increase in right ventricular wall thickness. Right ventricular systolic function is normal. Left Atrium: Left atrial size was severely dilated. Right Atrium: Right atrial size was mild to moderately dilated. Pericardium: There is no evidence of pericardial effusion. Mitral Valve: The mitral valve is normal in structure. There is mild thickening of the mitral valve leaflet(s). There is mild calcification of the mitral valve leaflet(s). Mild mitral annular calcification. Mild mitral valve regurgitation. No evidence of  mitral valve stenosis. Tricuspid Valve: The tricuspid valve is not well visualized. Tricuspid valve regurgitation is mild . No evidence of tricuspid stenosis. Aortic Valve: The aortic valve is tricuspid. There is mild calcification of the aortic valve. There is mild thickening of the aortic valve. There is mild aortic valve annular calcification. Aortic valve regurgitation is mild. Aortic regurgitation PHT measures 574 msec. Aortic valve mean gradient measures 4.5 mmHg. Aortic valve peak gradient measures 7.9 mmHg. Aortic valve area, by VTI measures 3.68 cm. Pulmonic Valve: The pulmonic valve was not well visualized. Pulmonic valve regurgitation is not visualized. No evidence of pulmonic stenosis. Aorta: The aortic root is normal in size and structure. Pulmonary Artery: Indeterminant PASP, inadequate TR jet. Venous: The inferior vena cava is normal in size with greater than 50% respiratory variability, suggesting right atrial pressure of 3 mmHg. IAS/Shunts: The interatrial septum was not well visualized. Additional Comments: A device lead is visualized.  LEFT VENTRICLE PLAX 2D LVIDd:         5.81 cm      Diastology LVIDs:         4.93 cm  LV e' medial:    4.03 cm/s LV PW:         1.20 cm      LV E/e' medial:  20.5 LV IVS:        1.24 cm      LV e' lateral:   4.03 cm/s LVOT diam:     2.70 cm      LV E/e' lateral: 20.5 LV SV:         97 LV SV Index:   51 LVOT Area:     5.73 cm  LV Volumes (MOD) LV vol d, MOD A4C: 193.0 ml LV vol s, MOD A4C: 132.0 ml LV SV MOD A4C:     193.0 ml RIGHT VENTRICLE RV Basal diam:  3.74 cm RV S prime:     12.20 cm/s LEFT ATRIUM             Index       RIGHT ATRIUM           Index LA diam:        4.60 cm 2.41 cm/m  RA Area:     21.40 cm LA Vol (A2C):   74.3 ml 38.91 ml/m RA Volume:   68.30 ml  35.76 ml/m LA Vol (A4C):   89.3 ml 46.76 ml/m LA Biplane Vol: 86.8 ml 45.45 ml/m  AORTIC VALVE AV Area (Vmax):    3.97 cm AV Area (Vmean):   3.86 cm AV Area (VTI):     3.68 cm AV Vmax:           140.47 cm/s AV Vmean:          100.930 cm/s AV VTI:            0.263 m AV Peak Grad:      7.9 mmHg AV Mean Grad:      4.5 mmHg LVOT Vmax:         97.30 cm/s LVOT Vmean:        68.100 cm/s LVOT VTI:          0.169 m LVOT/AV VTI ratio: 0.64 AI PHT:            574 msec  AORTA Ao Root diam: 3.10 cm MITRAL VALVE MV Area (PHT): 6.02 cm    SHUNTS MV Decel Time: 126 msec    Systemic VTI:  0.17 m MV E velocity: 82.70 cm/s  Systemic Diam: 2.70 cm MV A velocity: 52.80 cm/s MV E/A ratio:  1.57 Carlyle Dolly MD Electronically signed by Carlyle Dolly MD Signature Date/Time: 07/07/2020/5:13:03 PM    Final      CBC Recent Labs  Lab 07/17/20 1723 07/19/20 0430 07/20/20 0613 07/21/20 0431  WBC 9.3 10.8* 12.3* 10.8*  HGB 11.9* 12.5* 12.7* 11.8*  HCT 37.8* 39.9 40.4 37.4*  PLT 143* 122* 102* 102*  MCV 103.6* 104.7* 104.4* 103.0*  MCH 32.6 32.8 32.8 32.5  MCHC 31.5 31.3 31.4 31.6  RDW 15.9* 15.9* 15.9* 15.6*  LYMPHSABS 0.5* 0.6*  --   --   MONOABS 0.5 0.0*  --   --   EOSABS 0.1 0.1  --   --   BASOSABS 0.0 0.0  --   --     Chemistries  Recent Labs  Lab 07/17/20 1723 07/19/20 0430 07/20/20 0613 07/21/20 0431  NA 140 136 135  135  K 3.9 4.9 4.7 4.5  CL 95* 93* 94* 97*  CO2 33* 32 31 31  GLUCOSE 147* 152* 119* 108*  BUN 26* 33*  41* 42*  CREATININE 1.45* 1.78* 2.04* 1.85*  CALCIUM 8.5* 8.6* 8.4* 8.4*  MG  --   --  2.0  --   AST 19  --  18  --   ALT 17  --  17  --   ALKPHOS 81  --  88  --   BILITOT 1.0  --  0.7  --    ------------------------------------------------------------------------------------------------------------------ No results for input(s): CHOL, HDL, LDLCALC, TRIG, CHOLHDL, LDLDIRECT in the last 72 hours.  Lab Results  Component Value Date   HGBA1C 7.1 (H) 07/10/2020   ------------------------------------------------------------------------------------------------------------------ No results for input(s): TSH, T4TOTAL, T3FREE, THYROIDAB in the last 72 hours.  Invalid input(s): FREET3 ------------------------------------------------------------------------------------------------------------------ No results for input(s): VITAMINB12, FOLATE, FERRITIN, TIBC, IRON, RETICCTPCT in the last 72 hours.  Coagulation profile Recent Labs  Lab 07/17/20 1723 07/19/20 0406 07/20/20 0613 07/21/20 0431  INR 2.3* 2.2* 2.9* 3.0*    No results for input(s): DDIMER in the last 72 hours.  Cardiac Enzymes No results for input(s): CKMB, TROPONINI, MYOGLOBIN in the last 168 hours.  Invalid input(s): CK ------------------------------------------------------------------------------------------------------------------    Component Value Date/Time   BNP 517.0 (H) 07/11/2020 0615   BNP 144.7 (H) 01/20/2015 1223   Roxan Hockey M.D on 07/21/2020 at 5:49 PM  Go to [email or web address] - for contact info  [facility] - Office  [phone]

## 2020-07-21 NOTE — TOC Progression Note (Signed)
Transition of Care [facility]) - Progression Note    Patient Details  Name: Troy [name] MRN: [id number] Date of Birth: 01/23/1940  Transition of Care [facility]) CM/SW Contact  Natasha Bence, LCSW Phone Number: 07/21/2020, 2:47 PM  Clinical Narrative:    Patient's daughter agreeable [name]. [name] with [name] agreeable to take patient on 07/22/20. CSW notified Jackelyn Poling of new IV antibiotic treatment for d/c. CSW will add IV antibiotics to auth and facility once OPAT and progress not with updated info received. TOC to follow.         Expected Discharge Plan and Services                                                 Social Determinants of Health (SDOH) Interventions    Readmission Risk Interventions Readmission Risk Prevention Plan 07/11/2020 07/06/2020  Medication Screening - Complete  Transportation Screening - Complete  PCP or Specialist Appt within 5-7 Days Complete -  Home Care Screening Complete -  Medication Review (RN CM) Complete -  Some recent data might be hidden

## 2020-07-21 NOTE — Care Management Important Message (Signed)
Important Message  Patient Details  Name: Troy [name] MRN: [id number] Date of Birth: 07-09-1940   Medicare Important Message Given:  Yes     Troy [name] 07/21/2020, 4:32 PM

## 2020-07-21 NOTE — Progress Notes (Signed)
Occupational Therapy Treatment Patient Details Name: Troy [name] MRN: [id number] DOB: [birth year] Today's Date: 07/21/2020    History of present illness Troy [name] is an 80 yo male who presents with c/o being weak, having trouble walking at home. PMH: afib, CAD, COPD< CHF, diabetes, HTN, pacemaker placement   OT comments  Pt agreeable to OT/PT co-treatment. Pt able to complete UE strengthening seated at EOB with grading of exercised to 2x5 reps rather than 10 straight reps due to pt SOB. Pt required min G to min A for functional mobility transferring from EOB to chair with use of RW. Pt SpO2 briefly reached a low of mid to high 80s but quickly increased to upper 90s with cuing for pursed lip breathing and rest. Pt on 3 LPM of O2 for duration of session. Pt will continue to benefit from acute OT services to address deficit areas prior to d/c.   Follow Up Recommendations  SNF    Equipment Recommendations  None recommended by OT          Precautions / Restrictions Precautions Precautions: Fall Restrictions Weight Bearing Restrictions: No       Mobility Bed Mobility Overal bed mobility: Modified Independent             General bed mobility comments: HOB elevated, increase time and labored movement, required patient to rest at EOB    Transfers Overall transfer level: Needs assistance Equipment used: Rolling walker (2 wheeled) Transfers: Sit to/from Stand Sit to Stand: Min guard;Min assist Stand pivot transfers: Min assist;Min guard       General transfer comment: can transfer with RW, reliant on UE for power and quickly becomings SOB quickly requireing standing break    Balance Overall balance assessment: Needs assistance Sitting-balance support: Feet supported Sitting balance-[name]: Fair Sitting balance - Comments: good/fair at EOB   Standing balance support: Bilateral upper extremity supported;During functional activity Standing balance-Leahy  Scale: Poor Standing balance comment: reliant on UE support using RW                           ADL either performed or assessed with clinical judgement     Cognition Arousal/Alertness: Awake/alert Behavior During Therapy: WFL for tasks assessed/performed Overall Cognitive Status: Within Functional Limits for tasks assessed                                          Exercises Exercises: General Upper Extremity General Exercises - Upper Extremity Shoulder Flexion: 10 reps;AROM;Seated (2x5 reps) Shoulder ABduction: AROM;10 reps;Seated (2x5reps) General Exercises - Lower Extremity Long Arc Quad: Seated;AROM;Strengthening;Both;10 reps Hip Flexion/Marching: Seated;AROM;Both;10 reps;Strengthening Toe Raises: Seated;AROM;Both;10 reps;Strengthening Heel Raises: Seated;AROM;Strengthening;Both;10 reps Mini-Sqauts: Standing;AROM;Strengthening;10 reps;Both                Pertinent Vitals/ Pain       Pain Assessment: No/denies pain                                                          Frequency  Min 1X/week        Progress Toward Goals  OT Goals(current goals can now be found in the care plan section)  Progress towards  OT goals: Progressing toward goals  Acute Rehab OT Goals Patient Stated Goal: rehab, then home with an aide OT Goal Formulation: With patient Time For Goal Achievement: 08/15/20 Potential to Achieve Goals: Good ADL Goals Pt Will Perform Grooming: with supervision;standing Pt Will Perform Lower Body Dressing: with supervision;sitting/lateral leans;sit to/from stand Pt Will Transfer to Toilet: with supervision;ambulating;regular height toilet;bedside commode Pt Will Perform Toileting - Clothing Manipulation and hygiene: with supervision;sit to/from stand;sitting/lateral leans Pt/caregiver will Perform Home Exercise Program: Increased strength;With Supervision;With written HEP provided  Plan Discharge plan  remains appropriate    Co-evaluation    PT/OT/SLP Co-Evaluation/Treatment: Yes Reason for Co-Treatment: To address functional/ADL transfers PT goals addressed during session: Mobility/safety with mobility OT goals addressed during session: Strengthening/ROM                          End of Session Equipment Utilized During Treatment: Rolling walker;Oxygen  OT Visit Diagnosis: Unsteadiness on feet (R26.81);Muscle weakness (generalized) (M62.81)   Activity Tolerance Patient limited by fatigue   Patient Left in chair;with call bell/phone within reach             Time: 7741-4239 OT Time Calculation (min): 19 min  Charges: OT General Charges $OT Visit: 1 Visit OT Treatments $Therapeutic Exercise: 8-22 mins  Troy [name] OT, MOT    Troy [name] 07/21/2020, 10:54 AM

## 2020-07-21 NOTE — Progress Notes (Signed)
ANTICOAGULATION CONSULT NOTE - Pharmacy Consult for warfarin Indication: atrial fibrillation  Allergies  Allergen Reactions   Benadryl [Diphenhydramine Hcl] Nausea And Vomiting    Patient Measurements: Height: 5\' 6"  (167.6 cm) Weight: 82.9 kg (182 lb 12.2 oz) IBW/kg (Calculated) : 63.8  Vital Signs: Temp: 98.2 F (36.8 C) (07/01 0424) Temp Source: Oral (07/01 0424) BP: 124/63 (07/01 0500) Pulse Rate: 68 (07/01 0500)  Labs: Recent Labs    07/19/20 0406 07/19/20 0430 07/19/20 0430 07/20/20 0613 07/21/20 0431  HGB  --  12.5*   < > 12.7* 11.8*  HCT  --  39.9  --  40.4 37.4*  PLT  --  122*  --  102* 102*  LABPROT 24.6*  --   --  30.2* 30.7*  INR 2.2*  --   --  2.9* 3.0*  CREATININE  --  1.78*  --  2.04* 1.85*   < > = values in this interval not displayed.     Estimated Creatinine Clearance: 32.2 mL/min (A) (by C-G formula based on SCr of 1.85 mg/dL (H)).   Medical History: Past Medical History:  Diagnosis Date   Atrial fibrillation ([place])    CAD (coronary artery disease) 1996   status post PCI of the RCA    Community acquired pneumonia 08/24/2015   Congestive heart failure, unspecified    COPD (chronic obstructive pulmonary disease) (HCC)    Pt on home O2 at night and PRN, unsure of date of diagnosis.   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HCC)    Nephrolithiasis    Other primary cardiomyopathies    Personal history of DVT (deep vein thrombosis)    Prostatitis    Shingles     Medications:  See med rec   Assessment: Pharmacy consulted to dose warfarin in patient with atrial fibrillation.  INR on admission is therapeutic at 2.3.  Home dose listed 2 mg daily. Patient on diflucan also with drug interaction and may result in increased INR.  INR = 2.2 >> 2.9 > 3.0  Patient started on Fluconazole which could increase  INR  Goal of Therapy:  INR 2-3 Monitor platelets by anticoagulation protocol: Yes   Plan:  Warfarin 1 mg x 1 dose. Monitor daily INR and s/s of bleeding.  Margot Ables, PharmD Clinical Pharmacist 07/21/2020 7:53 AM

## 2020-07-22 DIAGNOSIS — D508 Other iron deficiency anemias: Secondary | ICD-10-CM | POA: Diagnosis not present

## 2020-07-22 DIAGNOSIS — E785 Hyperlipidemia, unspecified: Secondary | ICD-10-CM | POA: Diagnosis not present

## 2020-07-22 DIAGNOSIS — R4020 Unspecified coma: Secondary | ICD-10-CM | POA: Diagnosis not present

## 2020-07-22 DIAGNOSIS — I429 Cardiomyopathy, unspecified: Secondary | ICD-10-CM | POA: Diagnosis not present

## 2020-07-22 DIAGNOSIS — R1311 Dysphagia, oral phase: Secondary | ICD-10-CM | POA: Diagnosis not present

## 2020-07-22 DIAGNOSIS — Z79899 Other long term (current) drug therapy: Secondary | ICD-10-CM | POA: Diagnosis not present

## 2020-07-22 DIAGNOSIS — Z7901 Long term (current) use of anticoagulants: Secondary | ICD-10-CM | POA: Diagnosis not present

## 2020-07-22 DIAGNOSIS — I2510 Atherosclerotic heart disease of native coronary artery without angina pectoris: Secondary | ICD-10-CM | POA: Diagnosis not present

## 2020-07-22 DIAGNOSIS — R5381 Other malaise: Secondary | ICD-10-CM | POA: Diagnosis not present

## 2020-07-22 DIAGNOSIS — Z8616 Personal history of COVID-19: Secondary | ICD-10-CM | POA: Diagnosis not present

## 2020-07-22 DIAGNOSIS — R2689 Other abnormalities of gait and mobility: Secondary | ICD-10-CM | POA: Diagnosis not present

## 2020-07-22 DIAGNOSIS — I499 Cardiac arrhythmia, unspecified: Secondary | ICD-10-CM | POA: Diagnosis not present

## 2020-07-22 DIAGNOSIS — Z1612 Extended spectrum beta lactamase (ESBL) resistance: Secondary | ICD-10-CM | POA: Diagnosis not present

## 2020-07-22 DIAGNOSIS — N390 Urinary tract infection, site not specified: Secondary | ICD-10-CM | POA: Diagnosis not present

## 2020-07-22 DIAGNOSIS — N179 Acute kidney failure, unspecified: Secondary | ICD-10-CM | POA: Diagnosis not present

## 2020-07-22 DIAGNOSIS — A498 Other bacterial infections of unspecified site: Secondary | ICD-10-CM | POA: Diagnosis not present

## 2020-07-22 DIAGNOSIS — R0689 Other abnormalities of breathing: Secondary | ICD-10-CM | POA: Diagnosis not present

## 2020-07-22 DIAGNOSIS — Z950 Presence of cardiac pacemaker: Secondary | ICD-10-CM | POA: Diagnosis not present

## 2020-07-22 DIAGNOSIS — I10 Essential (primary) hypertension: Secondary | ICD-10-CM | POA: Diagnosis not present

## 2020-07-22 DIAGNOSIS — R404 Transient alteration of awareness: Secondary | ICD-10-CM | POA: Diagnosis not present

## 2020-07-22 DIAGNOSIS — I480 Paroxysmal atrial fibrillation: Secondary | ICD-10-CM | POA: Diagnosis not present

## 2020-07-22 DIAGNOSIS — R279 Unspecified lack of coordination: Secondary | ICD-10-CM | POA: Diagnosis not present

## 2020-07-22 DIAGNOSIS — Z95810 Presence of automatic (implantable) cardiac defibrillator: Secondary | ICD-10-CM | POA: Diagnosis not present

## 2020-07-22 DIAGNOSIS — R0902 Hypoxemia: Secondary | ICD-10-CM | POA: Diagnosis not present

## 2020-07-22 DIAGNOSIS — K219 Gastro-esophageal reflux disease without esophagitis: Secondary | ICD-10-CM | POA: Diagnosis not present

## 2020-07-22 DIAGNOSIS — I5022 Chronic systolic (congestive) heart failure: Secondary | ICD-10-CM | POA: Diagnosis not present

## 2020-07-22 DIAGNOSIS — I509 Heart failure, unspecified: Secondary | ICD-10-CM | POA: Diagnosis not present

## 2020-07-22 DIAGNOSIS — J449 Chronic obstructive pulmonary disease, unspecified: Secondary | ICD-10-CM | POA: Diagnosis not present

## 2020-07-22 DIAGNOSIS — E119 Type 2 diabetes mellitus without complications: Secondary | ICD-10-CM | POA: Diagnosis not present

## 2020-07-22 DIAGNOSIS — N186 End stage renal disease: Secondary | ICD-10-CM | POA: Diagnosis not present

## 2020-07-22 DIAGNOSIS — M6281 Muscle weakness (generalized): Secondary | ICD-10-CM | POA: Diagnosis not present

## 2020-07-22 LAB — CBC
HCT: 36 % — ABNORMAL LOW (ref 39.0–52.0)
Hemoglobin: 11.3 g/dL — ABNORMAL LOW (ref 13.0–17.0)
MCH: 32.1 pg (ref 26.0–34.0)
MCHC: 31.4 g/dL (ref 30.0–36.0)
MCV: 102.3 fL — ABNORMAL HIGH (ref 80.0–100.0)
Platelets: 120 10*3/uL — ABNORMAL LOW (ref 150–400)
RBC: 3.52 MIL/uL — ABNORMAL LOW (ref 4.22–5.81)
RDW: 15.9 % — ABNORMAL HIGH (ref 11.5–15.5)
WBC: 9.3 10*3/uL (ref 4.0–10.5)
nRBC: 0 % (ref 0.0–0.2)

## 2020-07-22 LAB — BASIC METABOLIC PANEL
Anion gap: 9 (ref 5–15)
BUN: 40 mg/dL — ABNORMAL HIGH (ref 8–23)
CO2: 32 mmol/L (ref 22–32)
Calcium: 8.7 mg/dL — ABNORMAL LOW (ref 8.9–10.3)
Chloride: 96 mmol/L — ABNORMAL LOW (ref 98–111)
Creatinine, Ser: 1.62 mg/dL — ABNORMAL HIGH (ref 0.61–1.24)
GFR, Estimated: 43 mL/min — ABNORMAL LOW (ref >60)
Glucose, Bld: 111 mg/dL — ABNORMAL HIGH (ref 70–99)
Potassium: 4.9 mmol/L (ref 3.5–5.1)
Sodium: 137 mmol/L (ref 135–145)

## 2020-07-22 LAB — PROTIME-INR
INR: 2.9 — ABNORMAL HIGH (ref 0.8–1.2)
Prothrombin Time: 29.9 seconds — ABNORMAL HIGH (ref 11.4–15.2)

## 2020-07-22 LAB — GLUCOSE, CAPILLARY
Glucose-Capillary: 108 mg/dL — ABNORMAL HIGH (ref 70–99)
Glucose-Capillary: 167 mg/dL — ABNORMAL HIGH (ref 70–99)
Glucose-Capillary: 141 mg/dL — ABNORMAL HIGH (ref 70–99)

## 2020-07-22 MED ORDER — POTASSIUM CHLORIDE CRYS ER 20 MEQ PO TBCR: 20.0000 meq | EXTENDED_RELEASE_TABLET | Freq: Every day | ORAL | 3 refills | Status: AC

## 2020-07-22 MED ORDER — ERTAPENEM IV (FOR PTA / DISCHARGE USE ONLY): 1.0000 g | INTRAVENOUS | 0 refills | Status: AC

## 2020-07-22 MED ORDER — OXYCODONE HCL 5 MG PO TABS: 5.0000 mg | ORAL_TABLET | Freq: Four times a day (QID) | ORAL | 0 refills | Status: AC | PRN

## 2020-07-22 MED ORDER — INSULIN LISPRO (1 UNIT DIAL) 100 UNIT/ML (KWIKPEN): 0.0000 [IU] | PEN_INJECTOR | Freq: Three times a day (TID) | SUBCUTANEOUS | 11 refills | Status: AC

## 2020-07-22 MED ORDER — TORSEMIDE 40 MG PO TABS: 40.0000 mg | ORAL_TABLET | Freq: Every day | ORAL | 2 refills | Status: AC

## 2020-07-22 MED ORDER — POLYETHYLENE GLYCOL 3350 17 G PO PACK: 17.0000 g | PACK | Freq: Every day | ORAL | 1 refills | Status: AC

## 2020-07-22 MED ORDER — WARFARIN SODIUM 1 MG PO TABS: 1.0000 mg | ORAL_TABLET | Freq: Every day | ORAL | 3 refills | Status: AC

## 2020-07-22 MED ORDER — WARFARIN SODIUM 1 MG PO TABS
1.0000 mg | ORAL_TABLET | Freq: Once | ORAL | Status: AC
  Administered 2020-07-22: 1 mg via ORAL
  Filled 2020-07-22: qty 1

## 2020-07-22 MED ORDER — ACETAMINOPHEN 325 MG PO TABS: 650.0000 mg | ORAL_TABLET | Freq: Four times a day (QID) | ORAL | 0 refills | Status: AC | PRN

## 2020-07-22 NOTE — Progress Notes (Signed)
Pt discharged to [facility]. Pt discharged with peripheral IV in the left forearm for 7 day IV antibiotic treatment at [facility]. Pt alert & oriented x 4. Transported via Biomedical scientist by EMS team.

## 2020-07-22 NOTE — Discharge Instructions (Addendum)
1)Very low-salt diet advised 2)Weigh yourself daily, call if you gain more than 3 pounds in 1 day or more than 5 pounds in 1 week as your diuretic medications may need to be adjusted 3)Limit your Fluid  intake to no more than 60 ounces (1.8 Liters) per day 4)Avoid ibuprofen/Advil/Aleve/Motrin/Goody Powders/Naproxen/BC powders/Meloxicam/Diclofenac/Indomethacin and other Nonsteroidal anti-inflammatory medications as these will make you more likely to bleed and can cause stomach ulcers, can also cause Kidney problems.  5) please repeat BMP, CBC and PT/INR blood test on Tuesday, 07/25/2020 6) sliding scale Humalog insulin--- insulin aspart (Humalog) injection 0-10 Units 0-10 Units Subcutaneous, 3 times daily with meals CBG < 70: Implement Hypoglycemia Standing Orders and refer to Hypoglycemia Standing Orders sidebar report  CBG 70 - 120: 0 unit CBG 121 - 150: 0 unit  CBG 151 - 200: 1 unit CBG 201 - 250: 2 units CBG 251 - 300: 4 units CBG 301 - 350: 6 units  CBG 351 - 400: 8 units  CBG > 400: 10 units 7)IV Ertapenem/Invanz  1 g daily for 7 days starting Sunday 07/23/2020 thru Saturday 07/29/20 inclusive

## 2020-07-22 NOTE — Progress Notes (Signed)
ANTICOAGULATION CONSULT NOTE - Pharmacy Consult for warfarin Indication: atrial fibrillation  Allergies  Allergen Reactions   Benadryl [Diphenhydramine Hcl] Nausea And Vomiting    Patient Measurements: Height: 5\' 6"  (167.6 cm) Weight: 82 kg (180 lb 12.4 oz) IBW/kg (Calculated) : 63.8  Vital Signs: Temp: 97.6 F (36.4 C) (07/02 0947) Temp Source: Oral (07/02 0947) BP: 151/53 (07/02 0947) Pulse Rate: 64 (07/02 0947)  Labs: Recent Labs    07/20/20 0613 07/21/20 0431 07/22/20 0717  HGB 12.7* 11.8* 11.3*  HCT 40.4 37.4* 36.0*  PLT 102* 102* 120*  LABPROT 30.2* 30.7* 29.9*  INR 2.9* 3.0* 2.9*  CREATININE 2.04* 1.85* 1.62*     Estimated Creatinine Clearance: 36.6 mL/min (A) (by C-G formula based on SCr of 1.62 mg/dL (H)).   Medical History: Past Medical History:  Diagnosis Date   Atrial fibrillation ([place])    CAD (coronary artery disease) 1996   status post PCI of the RCA    Community acquired pneumonia 08/24/2015   Congestive heart failure, unspecified    COPD (chronic obstructive pulmonary disease) (HCC)    Pt on home O2 at night and PRN, unsure of date of diagnosis.   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HCC)    Nephrolithiasis    Other primary cardiomyopathies    Personal history of DVT (deep vein thrombosis)    Prostatitis    Shingles     Medications:  See med rec   Assessment: Pharmacy consulted to dose warfarin in patient with atrial fibrillation.  INR on admission is therapeutic at 2.3.  Home dose listed 2 mg daily. Patient on diflucan also with drug interaction and may result in increased INR.  INR = 2.2 >> 2.9 > 3.0>2.9  Patient started on Fluconazole which could increase INR  Goal of Therapy:  INR 2-3 Monitor platelets by anticoagulation protocol: Yes   Plan:  Warfarin 1 mg x 1 dose. Monitor daily INR  and s/s of bleeding.  Thomasenia Sales, PharmD, MBA, BCGP Clinical Pharmacist  07/22/2020 11:05 AM

## 2020-07-22 NOTE — TOC Transition Note (Addendum)
Transition of Care [facility]) - CM/SW Discharge Note   Patient Details  Name: Troy [name] MRN: [id number] Date of Birth: 1940-02-21  Transition of Care [facility]) CM/SW Contact:  Natasha Bence, LCSW Phone Number: 07/22/2020, 3:58 PM   Clinical Narrative:    CSW contacted [name] with [name] to inquire if they are able to take patient. [name] agreeable to to take patient. [name] added to [name]. Auth approved. CSW completed med necessity. Nurse reported EMS called and report given. TOC signing off.   Final next level of care: Skilled Nursing Facility Barriers to Discharge: Barriers Resolved   Patient Goals and CMS Choice Patient states their goals for this hospitalization and ongoing recovery are:: Rehab with SNF CMS Medicare.gov Compare Post Acute Care list provided to:: Patient Choice offered to / list presented to : Patient  Discharge Placement              Patient chooses bed at: [name] Patient to be transferred to facility by: [name] Name of family member notified: [name], [name] (Niece)   [phone] Patient and family notified of of transfer: 07/22/20  Discharge Plan and Services                                     Social Determinants of Health (SDOH) Interventions     Readmission Risk Interventions Readmission Risk Prevention Plan 07/11/2020 07/06/2020  Medication Screening - Complete  Transportation Screening - Complete  PCP or Specialist Appt within 5-7 Days Complete -  Home Care Screening Complete -  Medication Review (RN CM) Complete -  Some recent data might be hidden

## 2020-07-22 NOTE — Progress Notes (Signed)
Called report to [facility] at [facility].

## 2020-07-22 NOTE — Discharge Summary (Signed)
Troy [name], is a 80 y.o. male  DOB 1940-12-01  MRN [id number].  Admission date:  07/17/2020  Admitting Physician  Rolla Plate, DO  Discharge Date:  07/22/2020   Primary MD  Patient, No Pcp Per (Inactive)  Recommendations for primary care physician for things to follow:   1)Very low-salt diet advised 2)Weigh yourself daily, call if you gain more than 3 pounds in 1 day or more than 5 pounds in 1 week as your diuretic medications may need to be adjusted 3)Limit your Fluid  intake to no more than 60 ounces (1.8 Liters) per day 4)Avoid ibuprofen/Advil/Aleve/Motrin/Goody Powders/Naproxen/BC powders/Meloxicam/Diclofenac/Indomethacin and other Nonsteroidal anti-inflammatory medications as these will make you more likely to bleed and can cause stomach ulcers, can also cause Kidney problems.  5) please repeat BMP, CBC and PT/INR blood test on Tuesday, 07/25/2020 6) sliding scale Humalog insulin--- insulin aspart (Humalog) injection 0-10 Units 0-10 Units Subcutaneous, 3 times daily with meals CBG < 70: Implement Hypoglycemia Standing Orders and refer to Hypoglycemia Standing Orders sidebar report  CBG 70 - 120: 0 unit CBG 121 - 150: 0 unit  CBG 151 - 200: 1 unit CBG 201 - 250: 2 units CBG 251 - 300: 4 units CBG 301 - 350: 6 units  CBG 351 - 400: 8 units  CBG > 400: 10 units 7)IV Ertapenem/Invanz  1 g daily for 7 days starting Sunday 07/23/2020 thru Saturday 07/29/20 inclusive  Admission Diagnosis  Weakness [R53.1] AKI (acute kidney injury) ([place]) [N17.9]  Discharge Diagnosis  Weakness [R53.1] AKI (acute kidney injury) ([place]) [N17.9]    Principal Problem:   Bacteremia and Sepsis due to ESBL-producing Escherichia coli Active Problems:   UTI due to extended-spectrum beta lactamase (ESBL) producing Escherichia coli   Chronic atrial fibrillation (HCC)   Controlled type 2 diabetes mellitus without complication,  without long-term current use of insulin (HCC)   Chronic respiratory failure with hypoxia ([place])   COVID-19   AKI (acute kidney injury) ([place])   Pressure injury of skin   Acute lower UTI      Past Medical History:  Diagnosis Date   Atrial fibrillation (HCC)    CAD (coronary artery disease) 1996   status post PCI of the RCA    Community acquired pneumonia 08/24/2015   Congestive heart failure, unspecified    COPD (chronic obstructive pulmonary disease) (HCC)    Pt on home O2 at night and PRN, unsure of date of diagnosis.   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HCC)    Nephrolithiasis    Other primary cardiomyopathies    Personal history of DVT (deep vein thrombosis)    Prostatitis    Shingles     Past Surgical History:  Procedure Laterality Date   BACK SURGERY     BIV ICD GENERATOR CHANGEOUT N/A 12/14/2018   Procedure: BIV ICD GENERATOR CHANGEOUT;  Surgeon: Evans Lance, MD;  Location: [facility];  Service: Cardiovascular;  Laterality: N/A;   CARDIAC DEFIBRILLATOR PLACEMENT     CORONARY STENT PLACEMENT     PACEMAKER INSERTION         HPI  from the history and physical done on the day of admission:     Troy [name]  is a 80 y.o. male, with history of atrial fibrillation, coronary artery disease, congestive heart failure, COPD, diabetes mellitus type 2, GERD, hypertension, ischemic dilated cardiomyopathy, history of DVT, and more presents the ED with a chief complaint of weakness.  Patient has been in the ED for approximately 36 hours.  With there was an attempt to get him placement from the ED, but then patient developed a fever and AKI.  Patient reports that at home he was feeling extremely weak and short of breath.  He does wear oxygen at home with a baseline of 3 L nasal cannula.  He reports that at baseline he can walk across  the room, but prior to coming in he could only take 2 steps before he felt completely exhausted.  He walks with a cane.  He denies any chest pain, palpitations.  He reports that he did not try to turn his oxygen up to see if it would help.  He thinks he has been eating and drinking normally and reports that he eats 2 meals a day.  First he says he has had no weight loss, then he says he has had some significant weight loss, so that is unclear.  He reports no dysuria, but does report a decrease in urine output.  He has had a cough that sometimes productive of clear sputum.  He has not had a fever before today.  He does report myalgias for which he is taken Tylenol at home, and it has helped.  Patient has no other complaints at this time.   Patient does report that he really needs help, has been in and out of the hospital frequently, feels weak at home.  He did have a positive COVID on June 15.   Patient was last discharged by Dr. Brigitte Pulse on June 21.  At that time he had presented for shortness of breath and worsening peripheral edema.  He was admitted with diagnosis of acute on chronic systolic congestive heart failure exacerbation and was diuresed during his hospitalization.  He was discharged with cardiology recommendation of torsemide 40 mg at home, and follow-up with cardiology in the office.  He was to be set up with home physical therapy as well.  At that time he was offered skilled nursing facility, but he refused the facility that was offered.  At this point he is concerned about safety for him to be at home, and he is looking for placement.   In the ED Temp as high as 102.4, heart rate 72, respiratory rate as high as 30, blood pressure 111/76, satting at 99% Tylenol given for fever No leukocytosis with a white blood cell count of 10.8, hemoglobin 12.5, platelets 122 Chemistry panel reveals an AKI with a creatinine of 1.78 -on June 20 his creatinine was 1.0 Patient had 2 loose bowel movements in the  ED so a GI panel was sent to the lab Blood culture pending Chest x-ray today shows mild bibasilar atelectasis versus infiltrate -with the fever and patient admitting to cough we will err on the side of infiltrate UA is indicative of UTI Urine culture pending Digoxin level 0.8  EKG shows A. fib rate controlled Warfarin per pharmacy consult was ordered Patient's home meds of Coreg, Uroxatrol, allopurinol, vitamin C, Lipitor, digoxin, potassium, ibuprofen, order continued Normal saline 500 mL bolus given Admission requested as patient has developed this fever in the ER and now has an AKI as well, so may benefit from hospitalist management     Hospital Course:     Brief Summary:- 80 y.o. male, with history of atrial fibrillation, coronary artery disease, congestive heart failure, COPD, diabetes mellitus type 2, GERD, hypertension, ischemic dilated cardiomyopathy, history of DVT as well as recent COVID-19 infection diagnosed on 07/04/2020, recently discharged from this facility on 07/11/2020 not readmitted on 07/19/2020 with  ESBL E coli Sepsis from Urinary Source   A/p 1)ESBL E coli Sepsis from Urinary Source----patient met sepsis criteria on admission with a temperature of 102.4, respiratory of 31,, WBC of 10.8 -Lactic acid was 3.2 and patient had transient hypotension PCT 9.86 -Blood cultures and urine cultures from 07/19/2020 with ESBL E. Coli -Stopped Rocephin to 2 g daily -Treated with IV meropenem --started on 07/20/2020 -discharge to SNF on 07/22/2020 on IV Invanz -UA also with yeast --completed Diflucan -Hemodynamics have stabilized -GI panel/stool culture negative -Leukocytosis and sepsis pathophysiology has resolved   2)AKI----acute kidney injury -on CKD 3A -Worsening renal function secondary to UTI, transient hypotension and dehydration -Creatinine is back down to to 1.62 from 2.04 (was 1.45 on admission)  from a baseline of 1.0 recently suspended due to sepsis , renally adjust  medications, avoid nephrotoxic agents / dehydration  / hypotension -Repeat BMP on Tuesday, 07/25/2020   3)HFrEF--patient with chronic systolic dysfunction CHF with EF in the 20 to 25% range -Restart torsemide at 40 mg daily, restart Coreg   4)DM2-last A1c 7.1 reflecting uncontrolled diabetes PTA, -Use Humalog Sliding scale insulin with Accu-Cheks/Fingersticks as ordered    5)PAFib/pacemaker in situ----- continue Coumadin for stroke prophylaxis, on digoxin and Coreg for rate control -A rate-control strategy has been pursued as he is not an ideal candidate for anti-arrhythmic therapy given his COPD and renal issues. -INR is 2.9 Coumadin resumed please check PT/INR on 07/26/2018   6) chronic hypoxic respiratory failure due to underlying COPD--at baseline patient uses 3 L of oxygen via nasal cannula  -Continue bronchodilators -Hold off on steroids, no acute COPD exacerbation at this time   7)Recent COVID-19 infection--- tested positive on 07/04/2020, largely asymptomatic from [place] standpoint okay to discontinue isolation   8)Social/ethics--- full code   9)Generalized weakness and Ambulatory Dysfunction--- PT eval appreciated recommends SNF rehab   Disposition/--SNF rehab Disposition: The patient is from: Home              Anticipated d/c is to: SNF---   discharge on IV Invanz on 07/22/2020   Code Status :  -  Code Status: Full Code    Family Communication:    NA (patient is alert, awake and coherent) Na  Discharge Condition: stable  Follow UP   Contact information for after-discharge care     [facility] .   Service: Skilled Nursing Contact information: [place] [phone]                      Consults obtained - na  Diet and Activity recommendation:  As advised  Discharge Instructions    Discharge Instructions     [facility] pharmacist to adjust dose for  Vancomycin, Aminoglycosides  and other anti-infective therapies as requested by physician.   Complete by: As directed    [facility] to provide Cath Flo 78m   Complete by: As directed    Administer for PICC line occlusion and as ordered by physician for other access device issues.   Anaphylaxis Kit: Provided to treat any anaphylactic reaction to the medication being provided to the patient if First Dose or when requested by physician   Complete by: As directed    IV Ertapenem/Invanz  1 g daily for 7 days starting Sunday 07/23/2020 thru Saturday 07/29/20 inclusive   Epinephrine 168mml vial / amp: Administer 0.63m14m0.63ml71mubcutaneously once for moderate to severe anaphylaxis, nurse to call physician and pharmacy when reaction occurs and call 911 if needed for immediate care   Diphenhydramine 50mg64mIV vial: Administer 25-50mg 10mM PRN for first dose reaction, rash, itching, mild reaction, nurse to call physician and pharmacy when reaction occurs   Sodium Chloride 0.9% NS 500ml I51mdminister if needed for hypovolemic blood pressure drop or as ordered by physician after call to physician with anaphylactic reaction   Call MD for:  difficulty breathing, headache or visual disturbances   Complete by: As directed    Call MD for:  persistant dizziness or light-headedness   Complete by: As directed    Call MD for:  persistant nausea and vomiting   Complete by: As directed    Call MD for:  severe uncontrolled pain   Complete by: As directed    Call MD for:  temperature >100.4   Complete by: As directed    Change dressing on IV access line weekly and PRN   Complete by: As directed    Diet - low sodium heart healthy   Complete by: As directed    Discharge instructions   Complete by: As directed    1)Very low-salt diet advised 2)Weigh yourself daily, call if you gain more than 3 pounds in 1 day or more than 5 pounds in 1 week as your diuretic medications may need to be adjusted 3)Limit  your Fluid  intake to no more than 60 ounces (1.8 Liters) per day 4)Avoid ibuprofen/Advil/Aleve/Motrin/Goody Powders/Naproxen/BC powders/Meloxicam/Diclofenac/Indomethacin and other Nonsteroidal anti-inflammatory medications as these will make you more likely to bleed and can cause stomach ulcers, can also cause Kidney problems.  5) please repeat BMP, CBC and PT/INR blood test on Tuesday, 07/25/2020 6) sliding scale Humalog insulin--- insulin aspart (Humalog) injection 0-10 Units 0-10 Units Subcutaneous, 3 times daily with meals CBG < 70: Implement Hypoglycemia Standing Orders and refer to Hypoglycemia Standing Orders sidebar report  CBG 70 - 120: 0 unit CBG 121 - 150: 0 unit  CBG 151 - 200: 1 unit CBG 201 - 250: 2 units CBG 251 - 300: 4 units CBG 301 - 350: 6 units  CBG 351 - 400: 8 units  CBG > 400: 10 units 7)IV Ertapenem/Invanz  1 g daily for 7 days starting Sunday 07/23/2020 thru Saturday 07/29/20 inclusive   Flush IV access with Sodium Chloride 0.9% and Heparin 10 units/ml or 100 units/ml   Complete by: As directed    Home infusion instructions - [facility]  IV Ertapenem/Invanz  1 g daily for 7 days starting Sunday 07/23/2020 thru Saturday 07/29/20 inclusive   Complete by: As directed    IV Ertapenem/Invanz  1 g daily for 7 days starting Sunday 07/23/2020 thru Saturday 07/29/20 inclusive   Instructions: Flush IV access with Sodium Chloride 0.9% and Heparin 10units/ml or  100units/ml   Change dressing on IV access line: Weekly and PRN   Instructions Cath Flo 48m: Administer for PICC Line occlusion and as ordered by physician for other access device   [facility] pharmacist to adjust dose for: Vancomycin, Aminoglycosides and other anti-infective therapies as requested by physician   Increase activity slowly   Complete by: As directed    Method of administration may be changed at the discretion of home infusion pharmacist based upon assessment of the patient and/or caregiver's  ability to self-administer the medication ordered   Complete by: As directed    IV Ertapenem/Invanz  1 g daily for 7 days starting Sunday 07/23/2020 thru Saturday 07/29/20 inclusive   No wound care   Complete by: As directed          Discharge Medications     Allergies as of 07/22/2020       Reactions   Benadryl [diphenhydramine Hcl] Nausea And Vomiting        Medication List     TAKE these medications    acetaminophen 325 MG tablet Commonly known as: TYLENOL Take 2 tablets (650 mg total) by mouth every 6 (six) hours as needed for mild pain (or Fever >/= 101).   albuterol 1.25 MG/3ML nebulizer solution Commonly known as: ACCUNEB INHALE CONTENTS OF 1 VIAL IN NEBULIZER EVERY 6 HOURS AS NEEDED.   albuterol 108 (90 Base) MCG/ACT inhaler Commonly known as: VENTOLIN HFA INHALE 1-2 PUFFS INTO THE LUNGS EVERY 6 HOURS AS NEEDED FOR WHEEZING OR SHORTNESS OF BREATH   alfuzosin 10 MG 24 hr tablet Commonly known as: UROXATRAL Take 10 mg by mouth daily.   allopurinol 100 MG tablet Commonly known as: ZYLOPRIM Take 1 tablet (100 mg total) daily by mouth.   atorvastatin 10 MG tablet Commonly known as: LIPITOR TAKE ONE TABLET BY MOUTH DAILY   benzonatate 100 MG capsule Commonly known as: TESSALON Take 1 capsule (100 mg total) by mouth every 6 (six) hours as needed for cough.   carvedilol 12.5 MG tablet Commonly known as: COREG TAKE ONE TABLET BY MOUTH TWICE DAILY. TAKE WITH A MEAL.   cetirizine 10 MG tablet Commonly known as: ZYRTEC Take 10 mg by mouth daily.   Cholecalciferol 25 MCG (1000 UT) tablet Take 1 tablet by mouth daily.   digoxin 0.125 MG tablet Commonly known as: LANOXIN TAKE 1/2 TABLET BY MOUTH DAILY   ertapenem  IVPB Commonly known as: INVANZ Inject 1 g into the vein daily for 7 days. Indication:  ESBL bacteremia First Dose: Yes Last Day of Therapy:  07/29/2020 Labs - Once weekly:  CBC/D and BMP, Labs - Every other week:  ESR and CRP Method of  administration: Mini-Bag Plus / Gravity Method of administration may be changed at the discretion of home infusion pharmacist based upon assessment of the patient and/or caregiver's ability to self-administer the medication ordered. Start taking on: July 23, 2020   fluticasone 50 MCG/ACT nasal spray Commonly known as: FLONASE Place 2 sprays into both nostrils at bedtime as needed for allergies.   guaiFENesin-dextromethorphan 100-10 MG/5ML syrup Commonly known as: ROBITUSSIN DM Take 15 mLs by mouth every 4 (four) hours as needed for cough.   insulin lispro 100 UNIT/ML KwikPen Commonly known as: HumaLOG KwikPen Inject 0-10 Units into the skin 4 (four) times daily -  before meals and at bedtime. insulin aspart (Humalog) injection 0-10 Units 0-10 Units Subcutaneous, 3 times daily with meals CBG < 70: Implement Hypoglycemia Standing Orders and refer  to Hypoglycemia Standing Orders sidebar report  CBG 70 - 120: 0 unit CBG 121 - 150: 0 unit  CBG 151 - 200: 1 unit CBG 201 - 250: 2 units CBG 251 - 300: 4 units CBG 301 - 350: 6 units  CBG 351 - 400: 8 units  CBG > 400: 10 units   oxyCODONE 5 MG immediate release tablet Commonly known as: Oxy IR/ROXICODONE Take 1 tablet (5 mg total) by mouth every 6 (six) hours as needed for moderate pain.   polyethylene glycol 17 g packet Commonly known as: MIRALAX / GLYCOLAX Take 17 g by mouth daily.   potassium chloride SA 20 MEQ tablet Commonly known as: KLOR-CON Take 1 tablet (20 mEq total) by mouth daily. What changed: how much to take   Torsemide 40 MG Tabs Take 40 mg by mouth daily. What changed: when to take this   Trelegy Ellipta 100-62.5-25 MCG/INH Aepb Generic drug: Fluticasone-Umeclidin-Vilant INHALE ONE PUFF INTO THE LUNGS DAILY   vitamin C 500 MG tablet Commonly known as: ASCORBIC ACID Take 1,000 mg by mouth every morning.   warfarin 1 MG tablet Commonly known as: COUMADIN Take as directed. If you are unsure how to take this medication,  talk to your nurse or doctor. Original instructions: Take 1 tablet (1 mg total) by mouth daily at 4 PM. Start taking on: July 23, 2020 What changed: See the new instructions.   zinc gluconate 50 MG tablet Take 50 mg by mouth daily.               Discharge Care Instructions  (From admission, onward)           Start     Ordered   07/22/20 0000  Change dressing on IV access line weekly and PRN  (Home infusion instructions - [facility] )        07/22/20 1144            Major procedures and Radiology Reports - PLEASE review detailed and final reports for all details, in brief -   DG Chest Port 1 View  Result Date: 07/19/2020 CLINICAL DATA:  Fever, COPD EXAM: PORTABLE CHEST 1 VIEW COMPARISON:  07/17/2020 FINDINGS: The lungs are symmetrically well expanded. Mild bibasilar atelectasis or infiltrate. No pneumothorax or pleural effusion. Mild cardiomegaly is stable. Left subclavian pacemaker defibrillator is unchanged. The pulmonary vascularity is normal. IMPRESSION: Mild bibasilar atelectasis or infiltrate. Stable cardiomegaly. Electronically Signed   By: Fidela Salisbury MD   On: 07/19/2020 04:59   DG Chest Port 1 View  Result Date: 07/17/2020 CLINICAL DATA:  Weakness.  Chills. EXAM: PORTABLE CHEST 1 VIEW COMPARISON:  07/07/2020 FINDINGS: Cardiomegaly. Pacemaker defibrillator appears the same as seen previously. Aortic atherosclerotic calcification. Small pleural effusion on the right as seen previously. Mild venous hypertension without frank edema. IMPRESSION: Stable exam. Cardiomegaly. Small right effusion. Mild venous hypertension. Electronically Signed   By: Nelson Chimes M.D.   On: 07/17/2020 18:48   DG Chest Port 1 View  Result Date: 07/07/2020 CLINICAL DATA:  Shortness of breath EXAM: PORTABLE CHEST 1 VIEW COMPARISON:  07/05/2020 FINDINGS: Persistent right greater than left pleural effusions with bibasilar atelectasis. Possible mild interstitial edema superimposed  on chronic interstitial changes. Stable cardiomegaly. Left chest wall ICD. IMPRESSION: No substantial change. Persistent right greater than left pleural effusions with bibasilar atelectasis. Possible mild interstitial edema superimposed on chronic interstitial changes. Electronically Signed   By: Macy Mis M.D.   On: 07/07/2020 16:25   DG  Chest Port 1 View  Result Date: 07/05/2020 CLINICAL DATA:  Shortness of breath and leg swelling EXAM: PORTABLE CHEST 1 VIEW COMPARISON:  06/20/2020 FINDINGS: Cardiac shadow is enlarged but stable. Defibrillator is again noted. Aortic calcifications are seen. Vascular congestion has improved somewhat in the interval from the prior exam. Increasing right-sided pleural effusion is noted with underlying basilar infiltrate. IMPRESSION: Increasing right-sided effusion with underlying right basilar infiltrate. Previously seen vascular congestion has improved in the interval from the prior exam. Electronically Signed   By: Inez Catalina M.D.   On: 07/05/2020 11:23   ECHOCARDIOGRAM COMPLETE  Result Date: 07/07/2020    ECHOCARDIOGRAM REPORT   Patient Name:   ESHAAN TITZER Date of Exam: 07/07/2020 Medical Rec #:  [id number]         Height:       66.0 in Accession #:    [id number]        Weight:       179.5 lb Date of Birth:  Aug 02, 1940        BSA:          1.910 m Patient Age:    72 years          BP:           141/76 mmHg Patient Gender: M                 HR:           70 bpm. Exam Location:  [facility] Procedure: 2D Echo, Cardiac Doppler and Color Doppler Indications:    CHF  History:        Patient has prior history of Echocardiogram examinations, most                 recent 02/01/2017. CHF and Cardiomyopathy, CAD, Defibrillator,                 COPD, Arrythmias:Atrial Fibrillation, Signs/Symptoms:Shortness                 of Breath; Risk Factors:Dyslipidemia and LE edema. COVID+.  Sonographer:    Dustin Flock RDCS Referring Phys: [phone] DAVID TAT  Sonographer Comments:  Image acquisition challenging due to COPD and Image acquisition challenging due to respiratory motion. IMPRESSIONS  1. Left ventricular ejection fraction, by estimation, is 20 to 25%. The left ventricle has severely decreased function. The left ventricle demonstrates global hypokinesis. There is mild left ventricular hypertrophy. Left ventricular diastolic parameters  are indeterminate.  2. Right ventricular systolic function is normal. The right ventricular size is normal.  3. Left atrial size was severely dilated.  4. Right atrial size was mild to moderately dilated.  5. The mitral valve is normal in structure. Mild mitral valve regurgitation. No evidence of mitral stenosis.  6. The aortic valve is tricuspid. There is mild calcification of the aortic valve. There is mild thickening of the aortic valve. Aortic valve regurgitation is mild.  7. The inferior vena cava is normal in size with greater than 50% respiratory variability, suggesting right atrial pressure of 3 mmHg. FINDINGS  Left Ventricle: Left ventricular ejection fraction, by estimation, is 20 to 25%. The left ventricle has severely decreased function. The left ventricle demonstrates global hypokinesis. The left ventricular internal cavity size was normal in size. There is mild left ventricular hypertrophy. Left ventricular diastolic parameters are indeterminate. Right Ventricle: The right ventricular size is normal. No increase in right ventricular wall thickness. Right ventricular systolic function is normal. Left Atrium: Left atrial  size was severely dilated. Right Atrium: Right atrial size was mild to moderately dilated. Pericardium: There is no evidence of pericardial effusion. Mitral Valve: The mitral valve is normal in structure. There is mild thickening of the mitral valve leaflet(s). There is mild calcification of the mitral valve leaflet(s). Mild mitral annular calcification. Mild mitral valve regurgitation. No evidence of  mitral valve  stenosis. Tricuspid Valve: The tricuspid valve is not well visualized. Tricuspid valve regurgitation is mild . No evidence of tricuspid stenosis. Aortic Valve: The aortic valve is tricuspid. There is mild calcification of the aortic valve. There is mild thickening of the aortic valve. There is mild aortic valve annular calcification. Aortic valve regurgitation is mild. Aortic regurgitation PHT measures 574 msec. Aortic valve mean gradient measures 4.5 mmHg. Aortic valve peak gradient measures 7.9 mmHg. Aortic valve area, by VTI measures 3.68 cm. Pulmonic Valve: The pulmonic valve was not well visualized. Pulmonic valve regurgitation is not visualized. No evidence of pulmonic stenosis. Aorta: The aortic root is normal in size and structure. Pulmonary Artery: Indeterminant PASP, inadequate TR jet. Venous: The inferior vena cava is normal in size with greater than 50% respiratory variability, suggesting right atrial pressure of 3 mmHg. IAS/Shunts: The interatrial septum was not well visualized. Additional Comments: A device lead is visualized.  LEFT VENTRICLE PLAX 2D LVIDd:         5.81 cm      Diastology LVIDs:         4.93 cm      LV e' medial:    4.03 cm/s LV PW:         1.20 cm      LV E/e' medial:  20.5 LV IVS:        1.24 cm      LV e' lateral:   4.03 cm/s LVOT diam:     2.70 cm      LV E/e' lateral: 20.5 LV SV:         97 LV SV Index:   51 LVOT Area:     5.73 cm  LV Volumes (MOD) LV vol d, MOD A4C: 193.0 ml LV vol s, MOD A4C: 132.0 ml LV SV MOD A4C:     193.0 ml RIGHT VENTRICLE RV Basal diam:  3.74 cm RV S prime:     12.20 cm/s LEFT ATRIUM             Index       RIGHT ATRIUM           Index LA diam:        4.60 cm 2.41 cm/m  RA Area:     21.40 cm LA Vol (A2C):   74.3 ml 38.91 ml/m RA Volume:   68.30 ml  35.76 ml/m LA Vol (A4C):   89.3 ml 46.76 ml/m LA Biplane Vol: 86.8 ml 45.45 ml/m  AORTIC VALVE AV Area (Vmax):    3.97 cm AV Area (Vmean):   3.86 cm AV Area (VTI):     3.68 cm AV Vmax:            140.47 cm/s AV Vmean:          100.930 cm/s AV VTI:            0.263 m AV Peak Grad:      7.9 mmHg AV Mean Grad:      4.5 mmHg LVOT Vmax:         97.30 cm/s LVOT Vmean:  68.100 cm/s LVOT VTI:          0.169 m LVOT/AV VTI ratio: 0.64 AI PHT:            574 msec  AORTA Ao Root diam: 3.10 cm MITRAL VALVE MV Area (PHT): 6.02 cm    SHUNTS MV Decel Time: 126 msec    Systemic VTI:  0.17 m MV E velocity: 82.70 cm/s  Systemic Diam: 2.70 cm MV A velocity: 52.80 cm/s MV E/A ratio:  1.57 Carlyle Dolly MD Electronically signed by Carlyle Dolly MD Signature Date/Time: 07/07/2020/5:13:03 PM    Final     Micro Results   Recent Results (from the past 240 hour(s))  Urine culture     Status: Abnormal   Collection Time: 07/19/20  4:19 AM   Specimen: Urine, Clean Catch  Result Value Ref Range Status   Specimen Description   Final    URINE, CLEAN CATCH Performed at [facility], [address], [place], [place] [address]    Special Requests   Final    NONE Performed at [facility], [address] [place], [place] [address]    Culture (A)  Final    >=100,000 COLONIES/mL ESCHERICHIA COLI Confirmed Extended Spectrum Beta-Lactamase Producer (ESBL).  In bloodstream infections from ESBL organisms, carbapenems are preferred over piperacillin/tazobactam. They are shown to have a lower risk of mortality.    Report Status 07/21/2020 FINAL  Final   Organism ID, Bacteria ESCHERICHIA COLI (A)  Final      Susceptibility   Escherichia coli - MIC*    AMPICILLIN >=32 RESISTANT Resistant     CEFAZOLIN >=64 RESISTANT Resistant     CEFEPIME 16 RESISTANT Resistant     CEFTRIAXONE >=64 RESISTANT Resistant     CIPROFLOXACIN 1 SENSITIVE Sensitive     GENTAMICIN >=16 RESISTANT Resistant     IMIPENEM <=0.25 SENSITIVE Sensitive     NITROFURANTOIN <=16 SENSITIVE Sensitive     TRIMETH/SULFA >=320 RESISTANT Resistant     AMPICILLIN/SULBACTAM >=32 RESISTANT Resistant     PIP/TAZO <=4 SENSITIVE Sensitive     * >=100,000  COLONIES/mL ESCHERICHIA COLI  Gastrointestinal Panel by PCR , Stool     Status: None   Collection Time: 07/19/20  4:20 AM   Specimen: Stool  Result Value Ref Range Status   Campylobacter species NOT DETECTED NOT DETECTED Final   Plesimonas shigelloides NOT DETECTED NOT DETECTED Final   Salmonella species NOT DETECTED NOT DETECTED Final   Yersinia enterocolitica NOT DETECTED NOT DETECTED Final   Vibrio species NOT DETECTED NOT DETECTED Final   Vibrio cholerae NOT DETECTED NOT DETECTED Final   Enteroaggregative E coli (EAEC) NOT DETECTED NOT DETECTED Final   Enteropathogenic E coli (EPEC) NOT DETECTED NOT DETECTED Final   Enterotoxigenic E coli (ETEC) NOT DETECTED NOT DETECTED Final   Shiga like toxin producing E coli (STEC) NOT DETECTED NOT DETECTED Final   Shigella/Enteroinvasive E coli (EIEC) NOT DETECTED NOT DETECTED Final   Cryptosporidium NOT DETECTED NOT DETECTED Final   Cyclospora cayetanensis NOT DETECTED NOT DETECTED Final   Entamoeba histolytica NOT DETECTED NOT DETECTED Final   Giardia lamblia NOT DETECTED NOT DETECTED Final   Adenovirus F40/41 NOT DETECTED NOT DETECTED Final   Astrovirus NOT DETECTED NOT DETECTED Final   Norovirus GI/GII NOT DETECTED NOT DETECTED Final   Rotavirus A NOT DETECTED NOT DETECTED Final   Sapovirus (I, II, IV, and V) NOT DETECTED NOT DETECTED Final    Comment: Performed at [facility], [address] [place], [place] [address]  Culture, blood (routine x 2)     Status: Abnormal   Collection Time: 07/19/20  4:32 AM   Specimen: BLOOD RIGHT HAND  Result Value Ref Range Status   Specimen Description   Final    BLOOD RIGHT HAND Performed at [facility], [address] [place], [place] [address]    Special Requests   Final    BOTTLES DRAWN AEROBIC AND ANAEROBIC Blood Culture adequate volume Performed at [facility], [address], [place], [place] [address]    Culture  Setup Time   Final    GRAM NEGATIVE RODS ANAEROBIC BOTTLE  ONLY Gram Stain Report Called to,Read Back By and Verified With: Lillie Fragmin, RN _0  07/19/2020 KAY GRAM NEGATIVE RODS AEROBIC BOTTLE ONLY Gram Stain Report Called to,Read Back By and Verified With: PREVIOUSLY CALLED TO BRITTANY FOLEY _1  07/19/2020 KAY Performed at [facility], [address], [place], [place] [address]    Culture (A)  Final    ESCHERICHIA COLI SUSCEPTIBILITIES PERFORMED ON PREVIOUS CULTURE WITHIN THE LAST 5 DAYS. Performed at [facility], [place] [address], [place], [place] [address]    Report Status 07/21/2020 FINAL  Final  Culture, blood (routine x 2)     Status: Abnormal   Collection Time: 07/19/20  4:39 AM   Specimen: BLOOD  Result Value Ref Range Status   Specimen Description   Final    BLOOD LEFT ANTECUBITAL Performed at [facility], [address] [place], [place] [address]    Special Requests   Final    BOTTLES DRAWN AEROBIC AND ANAEROBIC Blood Culture adequate volume Performed at [facility], [address] [place], [place] [address]    Culture  Setup Time   Final    GRAM NEGATIVE RODS IN BOTH AEROBIC AND ANAEROBIC BOTTLES Gram Stain Report Called to,Read Back By and Verified With: BRITTANY FOLEY,RN _2  07/19/2020 KAY Gram Stain Report Called to,Read Back By and Verified With: PREVIOUSLY CALLED _3  07/19/2020 KAY CRITICAL RESULT CALLED TO, READ BACK BY AND VERIFIED WITH: Illene Silver RN 07/20/20 0134 JDW Performed at [facility], [place] [address], [place], [place] [address]    Culture (A)  Final    ESCHERICHIA COLI Confirmed Extended Spectrum Beta-Lactamase Producer (ESBL).  In bloodstream infections from ESBL organisms, carbapenems are preferred over piperacillin/tazobactam. They are shown to have a lower risk of mortality.    Report Status 07/21/2020 FINAL  Final   Organism ID, Bacteria ESCHERICHIA COLI  Final      Susceptibility   Escherichia coli - MIC*    AMPICILLIN >=32 RESISTANT Resistant     CEFAZOLIN >=64 RESISTANT  Resistant     CEFEPIME 4 INTERMEDIATE Intermediate     CEFTAZIDIME RESISTANT Resistant     CEFTRIAXONE >=64 RESISTANT Resistant     CIPROFLOXACIN 0.5 SENSITIVE Sensitive     GENTAMICIN >=16 RESISTANT Resistant     IMIPENEM <=0.25 SENSITIVE Sensitive     TRIMETH/SULFA >=320 RESISTANT Resistant     AMPICILLIN/SULBACTAM >=32 RESISTANT Resistant     PIP/TAZO <=4 SENSITIVE Sensitive     * ESCHERICHIA COLI  Blood Culture ID Panel (Reflexed)     Status: Abnormal   Collection Time: 07/19/20  4:39 AM  Result Value Ref Range Status   Enterococcus faecalis NOT DETECTED NOT DETECTED Final   Enterococcus Faecium NOT DETECTED NOT DETECTED Final   Listeria monocytogenes NOT DETECTED NOT DETECTED Final   Staphylococcus species NOT DETECTED NOT DETECTED Final  Staphylococcus aureus (BCID) NOT DETECTED NOT DETECTED Final   Staphylococcus epidermidis NOT DETECTED NOT DETECTED Final   Staphylococcus lugdunensis NOT DETECTED NOT DETECTED Final   Streptococcus species NOT DETECTED NOT DETECTED Final   Streptococcus agalactiae NOT DETECTED NOT DETECTED Final   Streptococcus pneumoniae NOT DETECTED NOT DETECTED Final   Streptococcus pyogenes NOT DETECTED NOT DETECTED Final   A.calcoaceticus-baumannii NOT DETECTED NOT DETECTED Final   Bacteroides fragilis NOT DETECTED NOT DETECTED Final   Enterobacterales DETECTED (A) NOT DETECTED Final    Comment: Enterobacterales represent a large order of gram negative bacteria, not a single organism. CRITICAL RESULT CALLED TO, READ BACK BY AND VERIFIED WITH: C KINDLEY RN 07/20/20 0134 JDW    Enterobacter cloacae complex NOT DETECTED NOT DETECTED Final   Escherichia coli DETECTED (A) NOT DETECTED Final    Comment: CRITICAL RESULT CALLED TO, READ BACK BY AND VERIFIED WITH: C KINDLEY RN 07/20/20 0134 JDW    Klebsiella aerogenes NOT DETECTED NOT DETECTED Final   Klebsiella oxytoca NOT DETECTED NOT DETECTED Final   Klebsiella pneumoniae NOT DETECTED NOT DETECTED Final    Proteus species NOT DETECTED NOT DETECTED Final   Salmonella species NOT DETECTED NOT DETECTED Final   Serratia marcescens NOT DETECTED NOT DETECTED Final   Haemophilus influenzae NOT DETECTED NOT DETECTED Final   Neisseria meningitidis NOT DETECTED NOT DETECTED Final   Pseudomonas aeruginosa NOT DETECTED NOT DETECTED Final   Stenotrophomonas maltophilia NOT DETECTED NOT DETECTED Final   Candida albicans NOT DETECTED NOT DETECTED Final   Candida auris NOT DETECTED NOT DETECTED Final   Candida glabrata NOT DETECTED NOT DETECTED Final   Candida krusei NOT DETECTED NOT DETECTED Final   Candida parapsilosis NOT DETECTED NOT DETECTED Final   Candida tropicalis NOT DETECTED NOT DETECTED Final   Cryptococcus neoformans/gattii NOT DETECTED NOT DETECTED Final   CTX-M ESBL DETECTED (A) NOT DETECTED Final    Comment: CRITICAL RESULT CALLED TO, READ BACK BY AND VERIFIED WITH: C KINDLEY RN 07/20/20 0134 JDW (NOTE) Extended spectrum beta-lactamase detected. Recommend a carbapenem as initial therapy.      Carbapenem resistance IMP NOT DETECTED NOT DETECTED Final   Carbapenem resistance KPC NOT DETECTED NOT DETECTED Final   Carbapenem resistance NDM NOT DETECTED NOT DETECTED Final   Carbapenem resist OXA 48 LIKE NOT DETECTED NOT DETECTED Final   Carbapenem resistance VIM NOT DETECTED NOT DETECTED Final    Comment: Performed at [facility], [place] [address], [place], [place] [address]  MRSA Next Gen by PCR, Nasal     Status: None   Collection Time: 07/19/20 12:00 PM   Specimen: Nasal Mucosa; Nasal Swab  Result Value Ref Range Status   MRSA by PCR Next Gen NOT DETECTED NOT DETECTED Final    Comment: (NOTE) The GeneXpert MRSA Assay (FDA approved for NASAL specimens only), is one component of a comprehensive MRSA colonization surveillance program. It is not intended to diagnose MRSA infection nor to guide or monitor treatment for MRSA infections. Test performance is not FDA approved in  patients less than 19 years old. Performed at [facility], [address], [place], [place] [address]    Today   Subjective    Kamilo Och today has no complaints No fever  Or chills   No Nausea, Vomiting or Diarrhea        Patient has been seen and examined prior to discharge   Objective   Blood pressure (!) 151/53, pulse 64, temperature 97.6 F (36.4 C), temperature  source Oral, resp. rate 18, height _0  (1.676 m), weight 82 kg, SpO2 100 %.  Intake/Output Summary (Last 24 hours) at 07/22/2020 1202 Last data filed at 07/21/2020 1900 Gross per 24 hour  Intake 240 ml  Output 300 ml  Net -60 ml    Exam Gen:- Awake Alert, no acute distress , speaking in complete sentences HEENT:- [place].AT, No sclera icterus Neck-Supple Neck,No JVD,.  Lungs-air movement is fair, no rales no wheezing  CV- S1, S2 normal, irregular Abd-  +ve B.Sounds, Abd Soft, No tenderness,    Extremity/Skin:- 1+  edema of Both LE and Lt UE swelling From infiltrated IV site noted,   good pulses Psych-affect is appropriate, oriented x3 Neuro-generalized weakness no new focal deficits, no tremors    Data Review   CBC w Diff:  Lab Results  Component Value Date   WBC 9.3 07/22/2020   HGB 11.3 (L) 07/22/2020   HGB 12.3 (L) 12/09/2018   HCT 36.0 (L) 07/22/2020   HCT 35.4 (L) 12/09/2018   PLT 120 (L) 07/22/2020   PLT 149 (L) 12/09/2018   LYMPHOPCT 6 07/19/2020   BANDSPCT 15 07/19/2020   MONOPCT 0 07/19/2020   EOSPCT 1 07/19/2020   BASOPCT 0 07/19/2020   CMP:  Lab Results  Component Value Date   NA 137 07/22/2020   NA 141 06/25/2019   K 4.9 07/22/2020   CL 96 (L) 07/22/2020   CO2 32 07/22/2020   BUN 40 (H) 07/22/2020   BUN 28 (H) 06/25/2019   CREATININE 1.62 (H) 07/22/2020   CREATININE 1.63 (H) 09/25/2016   PROT 5.3 (L) 07/20/2020   ALBUMIN 2.4 (L) 07/20/2020   BILITOT 0.7 07/20/2020   ALKPHOS 88 07/20/2020   AST 18 07/20/2020   ALT 17 07/20/2020  .  Total Discharge time is about 33  minutes  Roxan Hockey M.D on 07/22/2020 at 12:02 PM  Go to [email or web address] -  for contact info  [facility] - Office  [phone]

## 2020-07-22 NOTE — Plan of Care (Signed)

## 2020-07-25 DIAGNOSIS — R5381 Other malaise: Secondary | ICD-10-CM | POA: Diagnosis not present

## 2020-07-25 DIAGNOSIS — I2510 Atherosclerotic heart disease of native coronary artery without angina pectoris: Secondary | ICD-10-CM | POA: Diagnosis not present

## 2020-07-25 DIAGNOSIS — N186 End stage renal disease: Secondary | ICD-10-CM | POA: Diagnosis not present

## 2020-07-25 DIAGNOSIS — D508 Other iron deficiency anemias: Secondary | ICD-10-CM | POA: Diagnosis not present

## 2020-07-25 DIAGNOSIS — J449 Chronic obstructive pulmonary disease, unspecified: Secondary | ICD-10-CM | POA: Diagnosis not present

## 2020-07-25 DIAGNOSIS — I509 Heart failure, unspecified: Secondary | ICD-10-CM | POA: Diagnosis not present

## 2020-07-27 DIAGNOSIS — Z7901 Long term (current) use of anticoagulants: Secondary | ICD-10-CM | POA: Diagnosis not present

## 2020-07-31 DIAGNOSIS — J449 Chronic obstructive pulmonary disease, unspecified: Secondary | ICD-10-CM | POA: Diagnosis not present

## 2020-07-31 DIAGNOSIS — Z79899 Other long term (current) drug therapy: Secondary | ICD-10-CM | POA: Diagnosis not present

## 2020-07-31 DIAGNOSIS — I509 Heart failure, unspecified: Secondary | ICD-10-CM | POA: Diagnosis not present

## 2020-07-31 DIAGNOSIS — R5381 Other malaise: Secondary | ICD-10-CM | POA: Diagnosis not present

## 2020-08-01 DIAGNOSIS — I499 Cardiac arrhythmia, unspecified: Secondary | ICD-10-CM | POA: Diagnosis not present

## 2020-08-01 DIAGNOSIS — R0902 Hypoxemia: Secondary | ICD-10-CM | POA: Diagnosis not present

## 2020-08-01 DIAGNOSIS — R4020 Unspecified coma: Secondary | ICD-10-CM | POA: Diagnosis not present

## 2020-08-01 DIAGNOSIS — R0689 Other abnormalities of breathing: Secondary | ICD-10-CM | POA: Diagnosis not present

## 2020-08-01 DIAGNOSIS — R404 Transient alteration of awareness: Secondary | ICD-10-CM | POA: Diagnosis not present

## 2020-08-04 ENCOUNTER — Ambulatory Visit: Payer: Medicare Other | Admitting: Student

## 2020-08-10 ENCOUNTER — Ambulatory Visit: Payer: Medicare Other | Admitting: Emergency Medicine

## 2020-08-21 DEATH — deceased

## 2020-08-31 ENCOUNTER — Ambulatory Visit: Payer: Medicare Other | Admitting: Interventional Cardiology

## 2020-09-19 ENCOUNTER — Ambulatory Visit: Payer: Medicare Other | Admitting: Interventional Cardiology
